# Patient Record
Sex: Female | Born: 1975
Health system: Southern US, Community
[De-identification: ages and names within clinical notes are randomized; demographics above are authoritative.]

## PROBLEM LIST (undated history)

## (undated) DIAGNOSIS — I10 Essential (primary) hypertension: Secondary | ICD-10-CM

## (undated) DIAGNOSIS — D649 Anemia, unspecified: Secondary | ICD-10-CM

## (undated) DIAGNOSIS — Z8679 Personal history of other diseases of the circulatory system: Secondary | ICD-10-CM

## (undated) DIAGNOSIS — R609 Edema, unspecified: Secondary | ICD-10-CM

## (undated) DIAGNOSIS — E119 Type 2 diabetes mellitus without complications: Secondary | ICD-10-CM

## (undated) DIAGNOSIS — K219 Gastro-esophageal reflux disease without esophagitis: Secondary | ICD-10-CM

## (undated) DIAGNOSIS — G43909 Migraine, unspecified, not intractable, without status migrainosus: Secondary | ICD-10-CM

## (undated) DIAGNOSIS — M1990 Unspecified osteoarthritis, unspecified site: Secondary | ICD-10-CM

## (undated) DIAGNOSIS — F909 Attention-deficit hyperactivity disorder, unspecified type: Secondary | ICD-10-CM

## (undated) DIAGNOSIS — F32A Depression, unspecified: Secondary | ICD-10-CM

## (undated) DIAGNOSIS — F419 Anxiety disorder, unspecified: Secondary | ICD-10-CM

## (undated) HISTORY — PX: OTHER SURGICAL HISTORY: SHX169

## (undated) HISTORY — DX: Migraine, unspecified, not intractable, without status migrainosus: G43.909

## (undated) HISTORY — DX: Type 2 diabetes mellitus without complications: E11.9

## (undated) HISTORY — DX: Anemia, unspecified: D64.9

## (undated) HISTORY — PX: OVARIAN CYST REMOVAL: SHX89

## (undated) HISTORY — DX: Depression, unspecified: F32.A

## (undated) HISTORY — DX: Edema, unspecified: R60.9

## (undated) HISTORY — PX: CHOLECYSTECTOMY: SHX55

## (undated) HISTORY — PX: ESOPHAGEAL DILATION: SHX303

## (undated) HISTORY — DX: Personal history of other diseases of the circulatory system: Z86.79

---

## 1998-01-05 ENCOUNTER — Emergency Department (HOSPITAL_COMMUNITY): Admission: EM | Admit: 1998-01-05 | Discharge: 1998-01-05 | Payer: Self-pay | Admitting: Emergency Medicine

## 1998-02-14 ENCOUNTER — Emergency Department (HOSPITAL_COMMUNITY): Admission: EM | Admit: 1998-02-14 | Discharge: 1998-02-14 | Payer: Self-pay | Admitting: Emergency Medicine

## 1998-02-15 ENCOUNTER — Emergency Department (HOSPITAL_COMMUNITY): Admission: EM | Admit: 1998-02-15 | Discharge: 1998-02-15 | Payer: Self-pay | Admitting: Emergency Medicine

## 1999-12-11 ENCOUNTER — Emergency Department (HOSPITAL_COMMUNITY): Admission: EM | Admit: 1999-12-11 | Discharge: 1999-12-11 | Payer: Self-pay | Admitting: Emergency Medicine

## 2002-01-04 ENCOUNTER — Encounter: Payer: Self-pay | Admitting: *Deleted

## 2002-01-04 ENCOUNTER — Emergency Department (HOSPITAL_COMMUNITY): Admission: EM | Admit: 2002-01-04 | Discharge: 2002-01-04 | Payer: Self-pay | Admitting: *Deleted

## 2002-01-22 ENCOUNTER — Encounter (HOSPITAL_COMMUNITY): Admission: RE | Admit: 2002-01-22 | Discharge: 2002-02-21 | Payer: Self-pay | Admitting: Family Medicine

## 2002-02-28 ENCOUNTER — Emergency Department (HOSPITAL_COMMUNITY): Admission: EM | Admit: 2002-02-28 | Discharge: 2002-02-28 | Payer: Self-pay | Admitting: Emergency Medicine

## 2002-02-28 ENCOUNTER — Encounter: Payer: Self-pay | Admitting: Emergency Medicine

## 2004-01-03 ENCOUNTER — Emergency Department (HOSPITAL_COMMUNITY): Admission: EM | Admit: 2004-01-03 | Discharge: 2004-01-03 | Payer: Self-pay | Admitting: Emergency Medicine
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
## 2007-08-16 ENCOUNTER — Emergency Department (HOSPITAL_COMMUNITY): Admission: EM | Admit: 2007-08-16 | Discharge: 2007-08-17 | Payer: Self-pay | Admitting: Emergency Medicine

## 2007-08-16 IMAGING — CT CT ABDOMEN W/ CM
1 of 3 series · 14 of 32 positions shown, 19 images · IV contrast (Omnipaque 300)
Comparison: None

CLINICAL DATA: Rectal bleeding.  Lower abdominal pain.  
 ABDOMEN CT WITH CONTRAST:
TECHNIQUE: Multidetector CT imaging of the abdomen was performed following the standard protocol during bolus administration of intravenous contrast.
 Contrast:  100 cc Omnipaque 300
TECHNIQUE: Multidetector CT imaging of the pelvis was performed following the standard protocol during bolus administration of intravenous contrast.

[Series 2: abd_pel 5.0 b40f · axial · 0.69mm/px · z∈[-466,-52]mm · 14 of 95 slices shown, 19 images]
[im 6/95  soft-tissue]
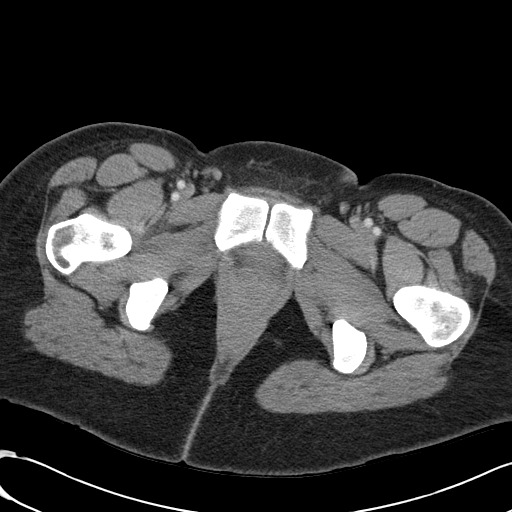
[im 6/95  bone]
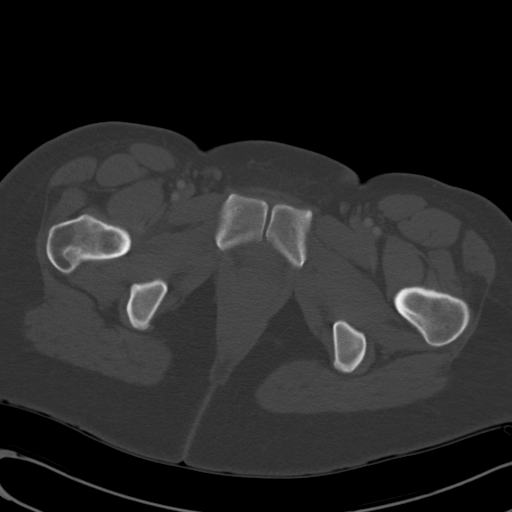
[im 12/95  soft-tissue]
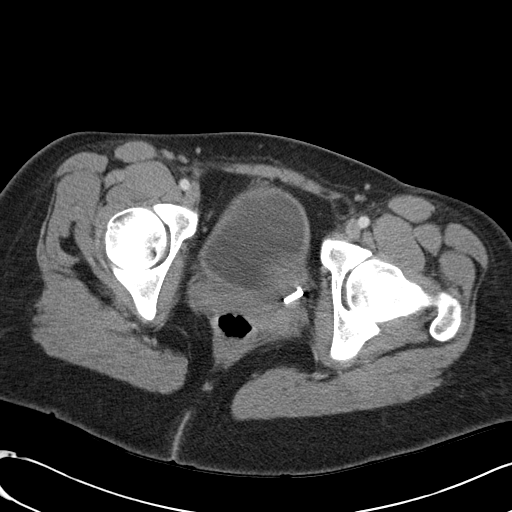
[im 23/95  soft-tissue]
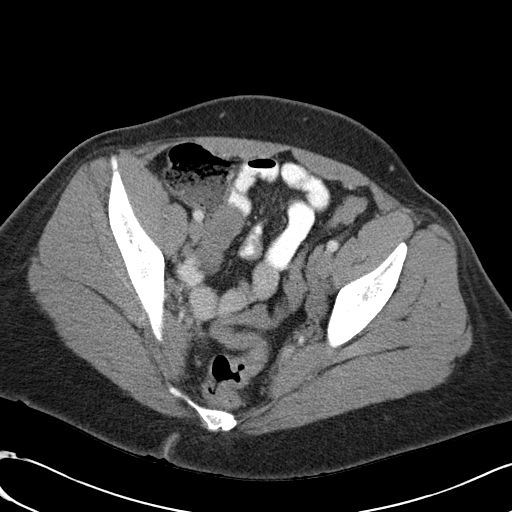
[im 28/95  soft-tissue]
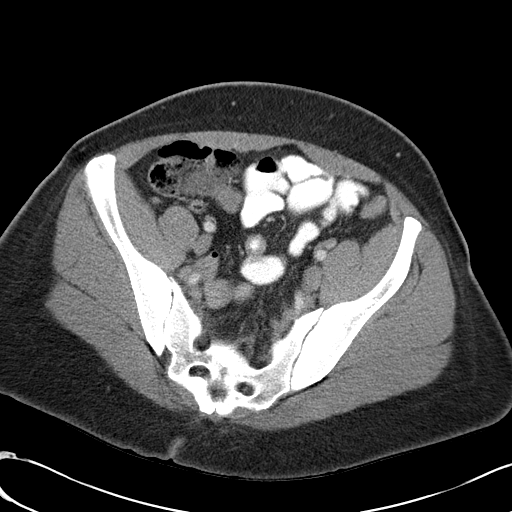
[im 34/95  soft-tissue]
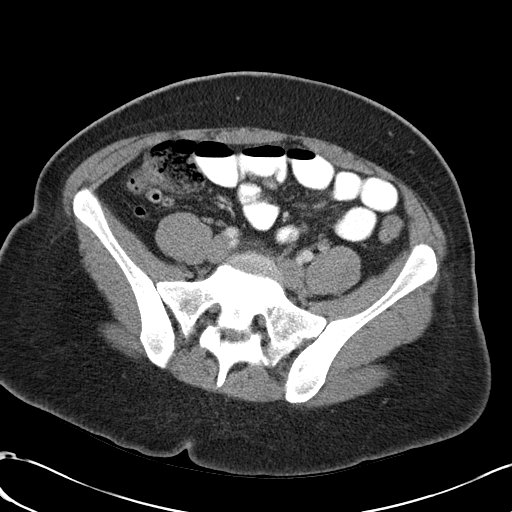
[im 39/95  soft-tissue]
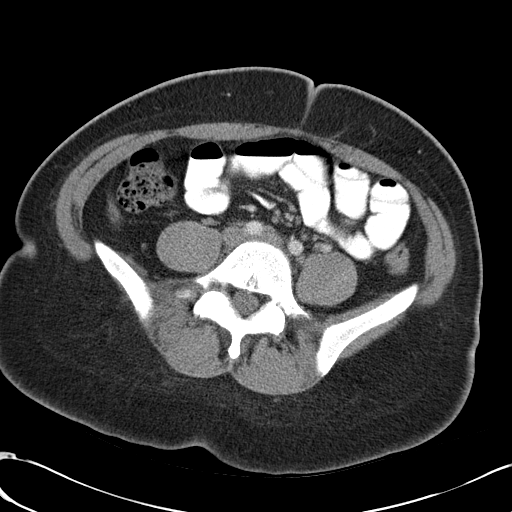
[im 50/95  soft-tissue]
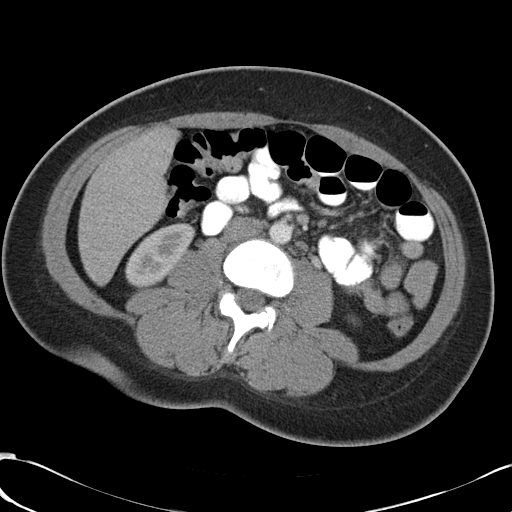
[im 56/95  soft-tissue]
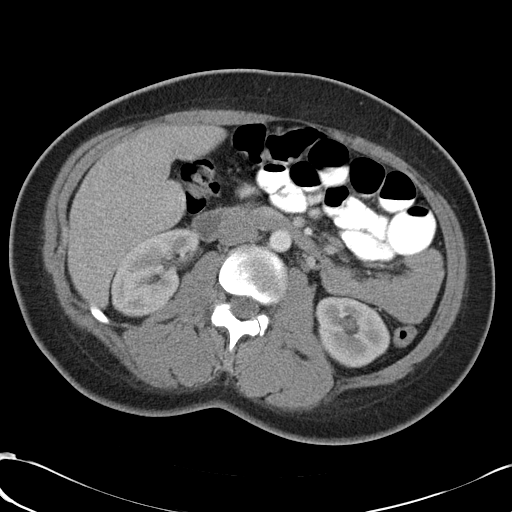
[im 61/95  soft-tissue]
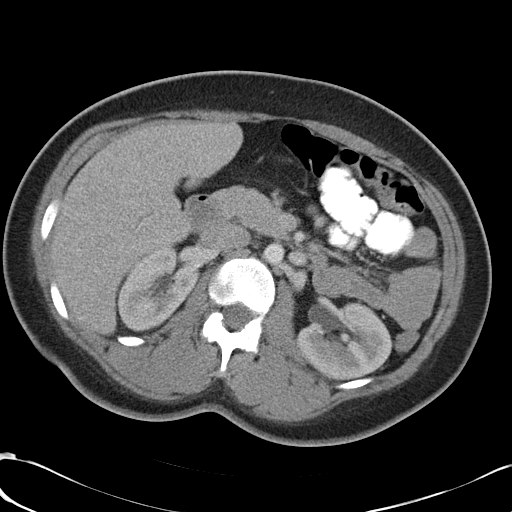
[im 61/95  bone]
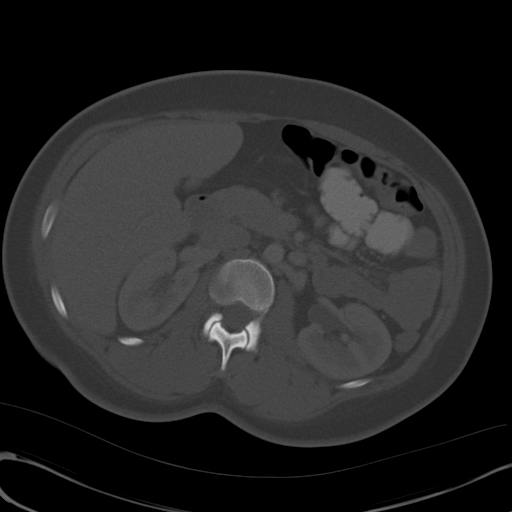
[im 67/95  soft-tissue]
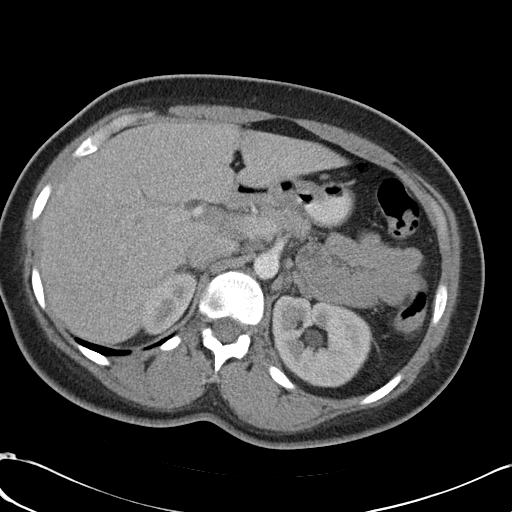
[im 72/95  soft-tissue]
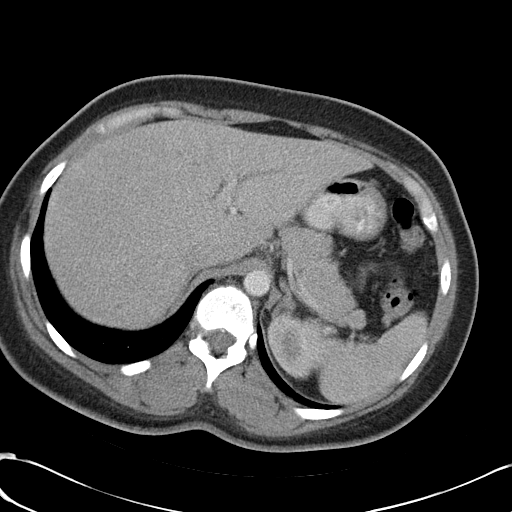
[im 72/95  lung]
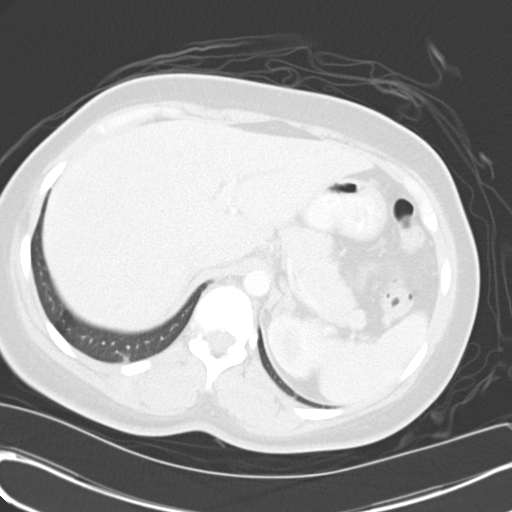
[im 78/95  lung]
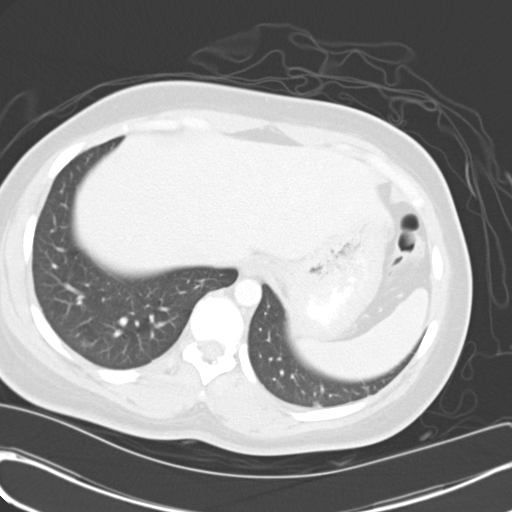
[im 83/95  soft-tissue]
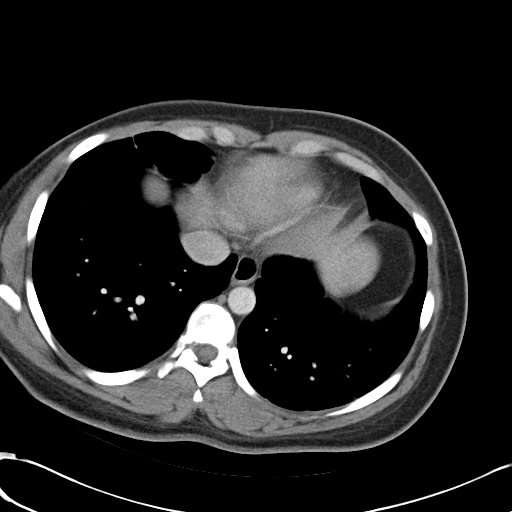
[im 83/95  lung]
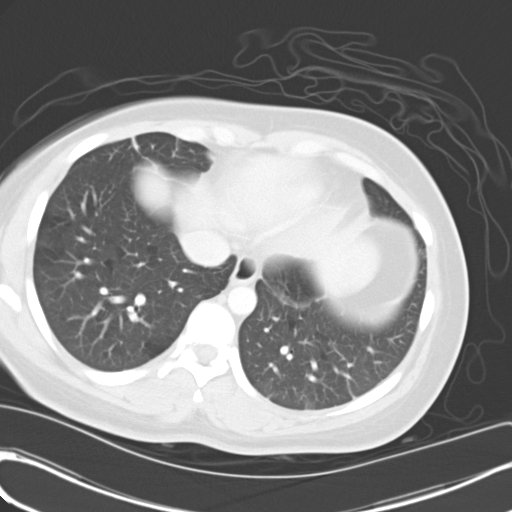
[im 89/95  soft-tissue]
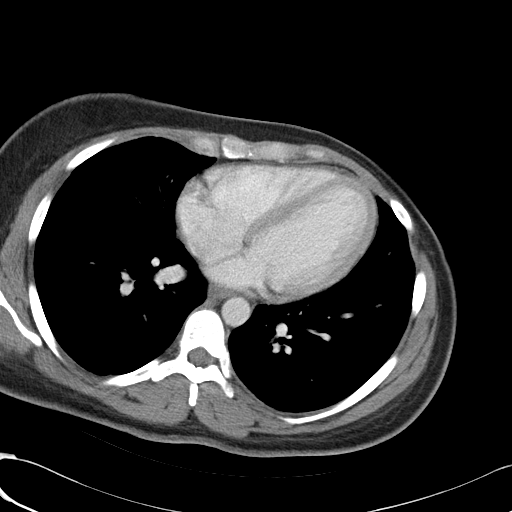
[im 89/95  lung]
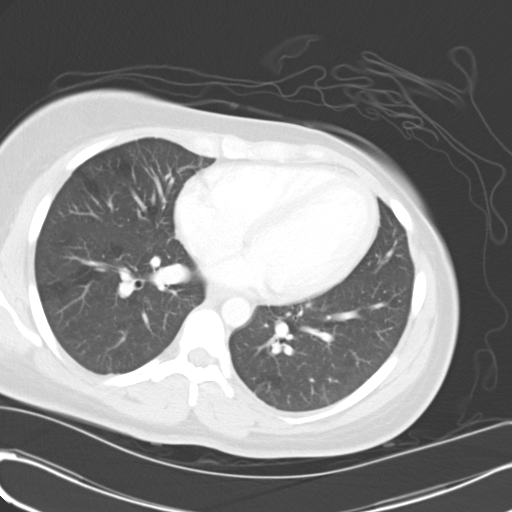

[14 of 32 positions shown; findings below may reference images not displayed]

FINDINGS: There is some basilar atelectatic change.  No pleural or pericardial effusion.  
 Patient is status post cholecystectomy.  The liver, biliary tree, adrenal glands, spleen, pancreas, and kidneys are unremarkable.  Stomach and small bowel have a normal CT appearance.  No abdominal lymphadenopathy or fluid collection.  No focal bony abnormality.
IMPRESSION: No acute finding in the abdomen with postoperative change of cholecystectomy noted. 
 PELVIS CT WITH CONTRAST:
FINDINGS: Surgical clips are noted in the left side of the pelvis.  Uterus and adnexa and unremarkable.  Radiopaque foreign body is noted along the left side of the mesentery anterior to the descending colon which could represent postsurgical post traumatic change.  It is of no clinical significance.  The colon appears normal.  The appendix is well visualized and normal.  No pelvic fluid or lymphadenopathy.  No focal bony abnormality.
IMPRESSION: Postoperative change in the pelvis.  No acute finding.

## 2007-11-10 ENCOUNTER — Emergency Department (HOSPITAL_COMMUNITY): Admission: EM | Admit: 2007-11-10 | Discharge: 2007-11-11 | Payer: Self-pay | Admitting: Emergency Medicine

## 2008-12-09 ENCOUNTER — Emergency Department (HOSPITAL_COMMUNITY): Admission: EM | Admit: 2008-12-09 | Discharge: 2008-12-09 | Payer: Self-pay | Admitting: Emergency Medicine

## 2008-12-22 ENCOUNTER — Emergency Department (HOSPITAL_COMMUNITY): Admission: EM | Admit: 2008-12-22 | Discharge: 2008-12-22 | Payer: Self-pay | Admitting: Emergency Medicine

## 2008-12-22 IMAGING — US US PELVIS COMPLETE MODIFY
1 series · 14 of 25 positions shown · non-contrast
Comparison: None

CLINICAL DATA: Vaginal bleeding, left pelvic pain

TRANSABDOMINAL AND TRANSVAGINAL ULTRASOUND OF PELVIS
TECHNIQUE: Both transabdominal and transvaginal ultrasound
examinations of the pelvis were performed including evaluation of
the uterus, ovaries, adnexal regions, and pelvic cul-de-sac.

[Series 1: us pelvis complete modify · 0.26mm/px · 14 of 58 slices shown]
[im 1/58]
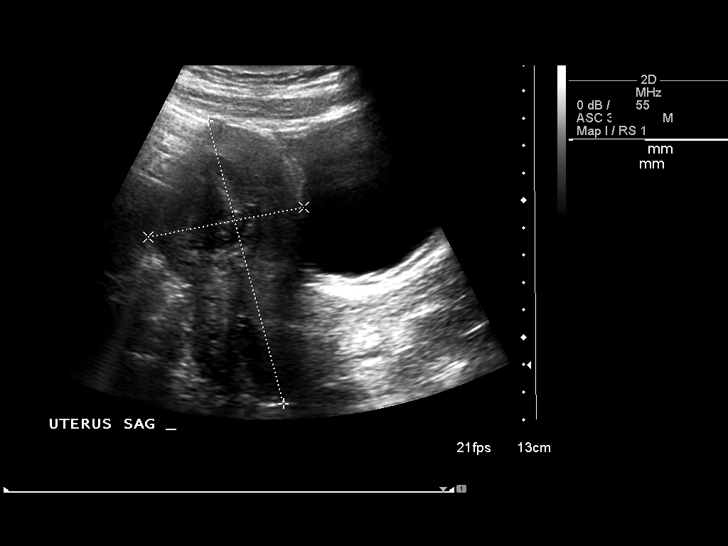
[im 5/58]
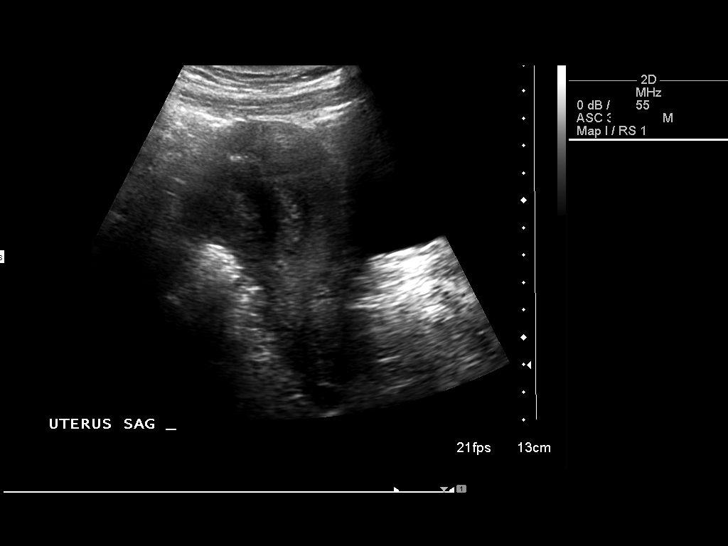
[im 10/58]
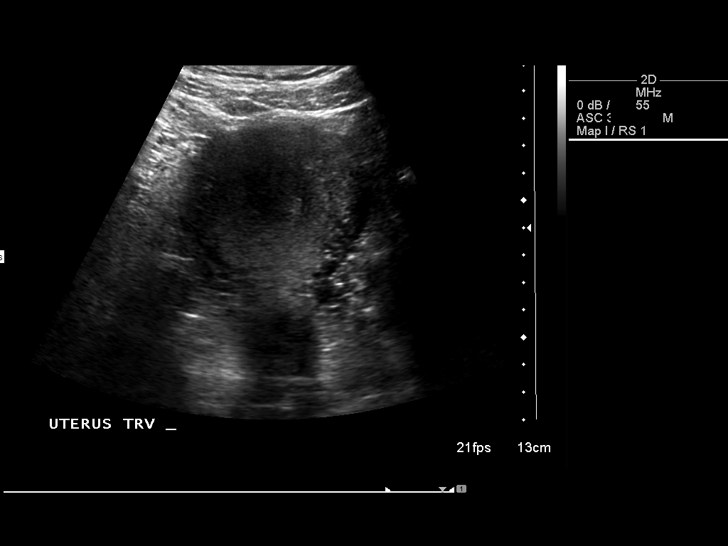
[im 15/58]
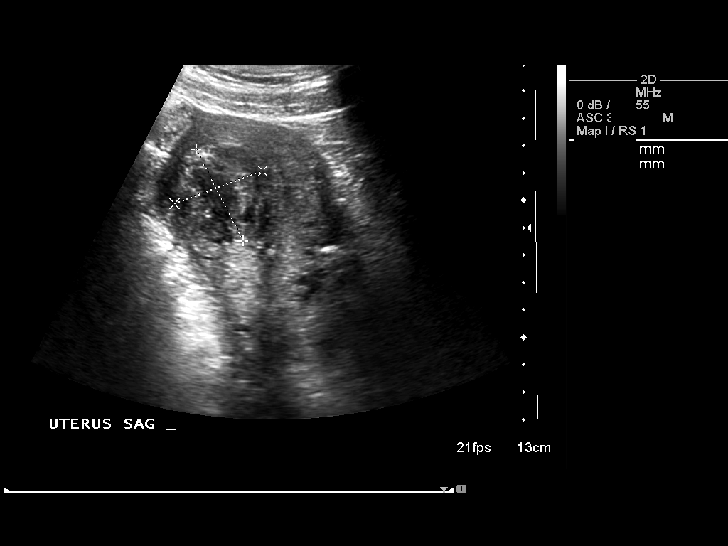
[im 20/58]
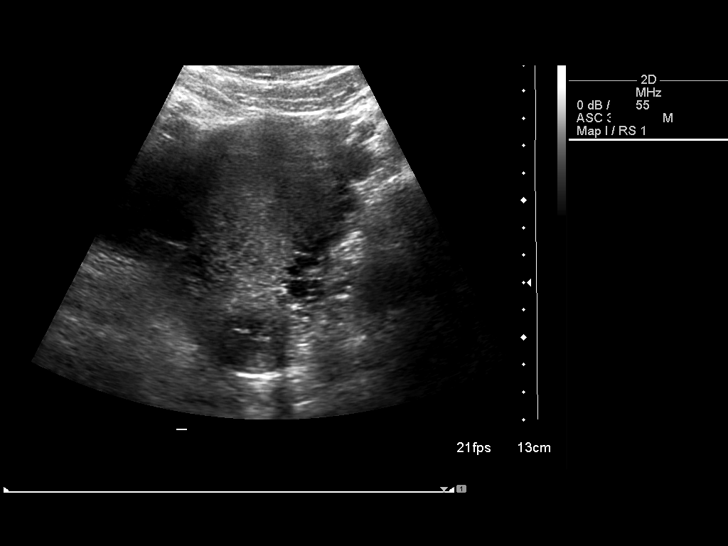
[im 22/58]
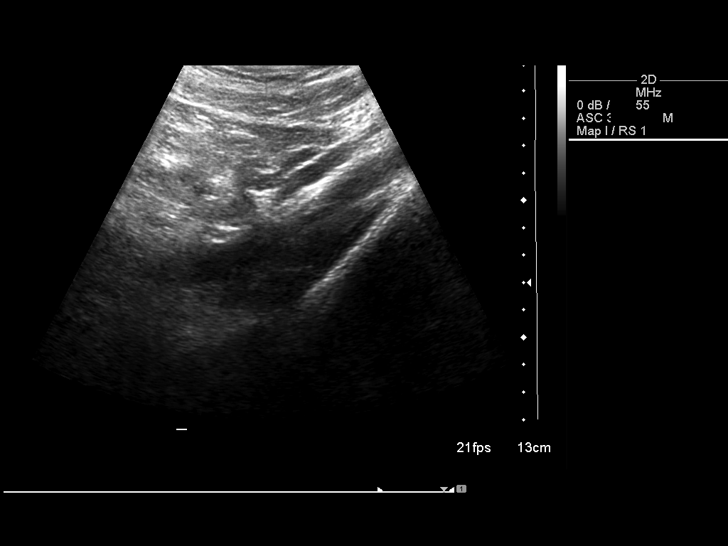
[im 27/58]
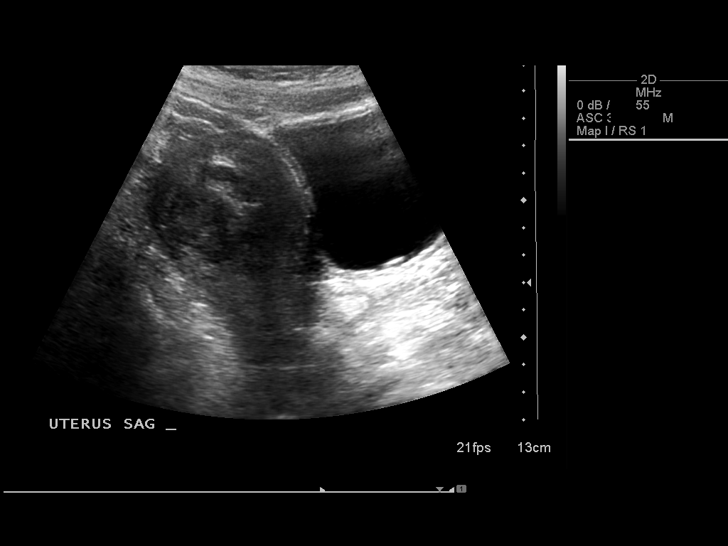
[im 31/58]
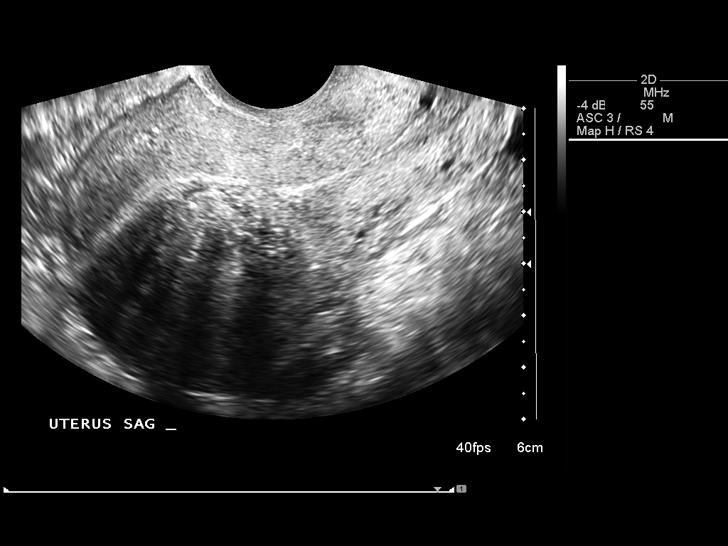
[im 36/58]
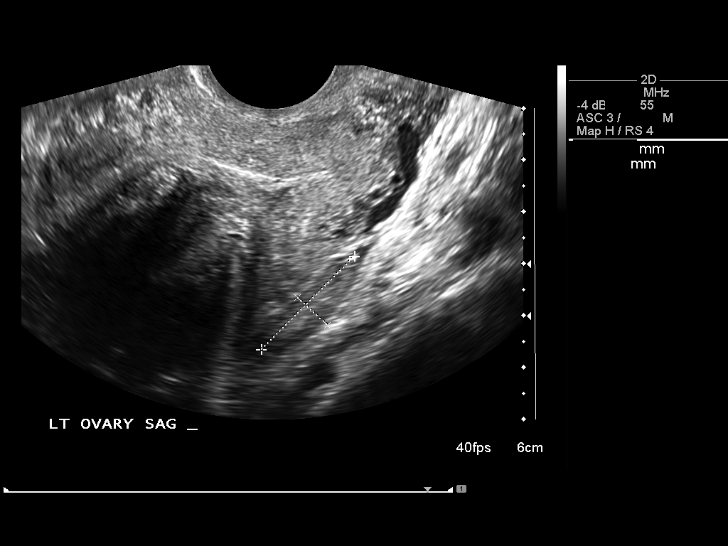
[im 39/58]
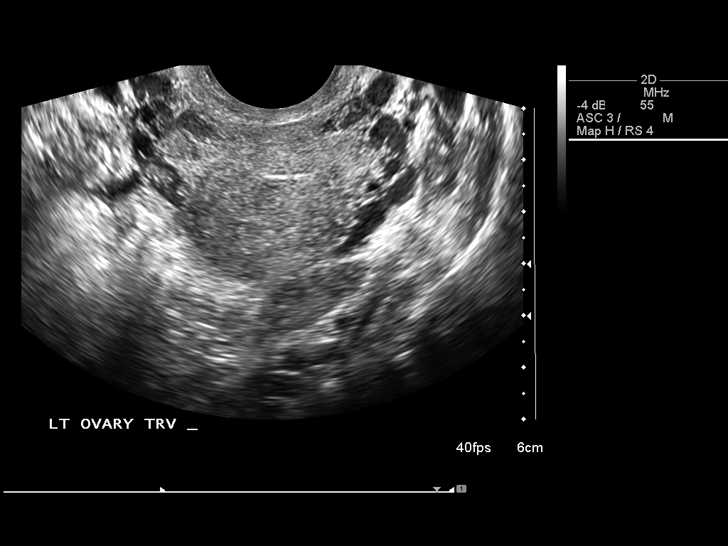
[im 43/58]
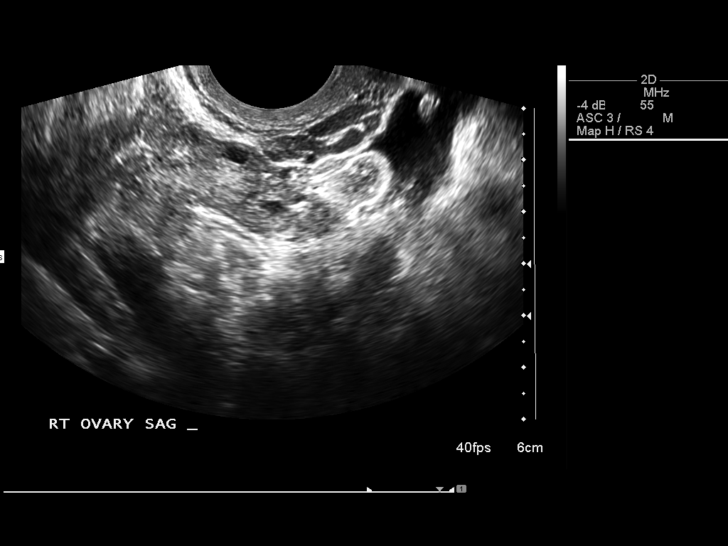
[im 48/58]
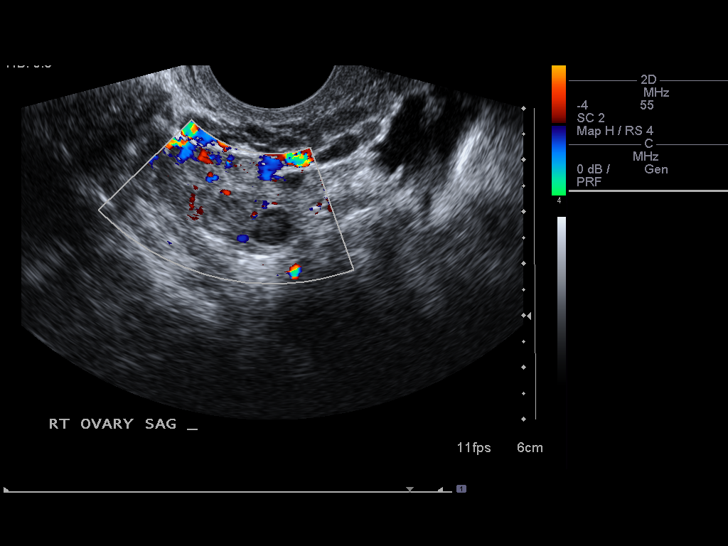
[im 53/58]
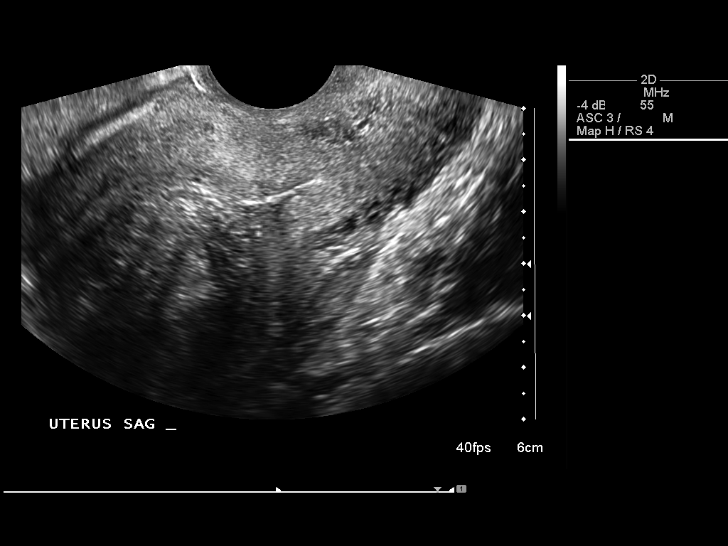
[im 58/58]
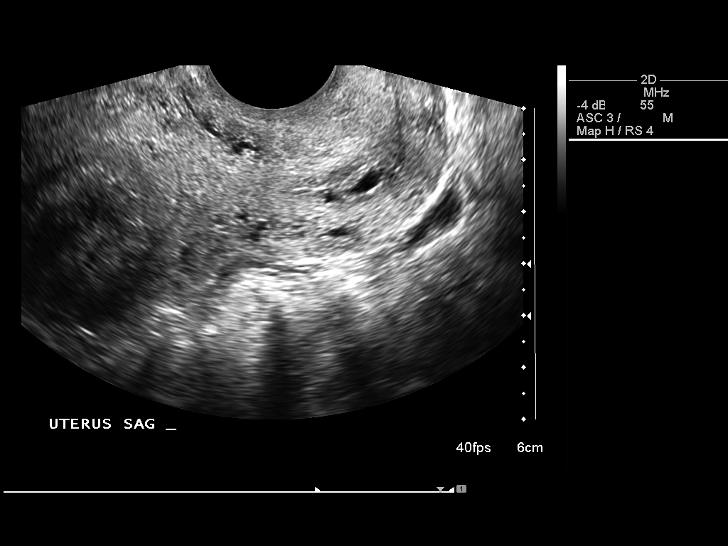

[14 of 25 positions shown; findings below may reference images not displayed]

FINDINGS: Uterus:  10.7 cm length by 5.8 cm AP by 6.7 cm transverse.
Heterogeneous soft tissue mass submucosal at upper uterine segment
posteriorly, 4.1 x 3.7 x 3.7 cm, compatible with leiomyoma.

Endometrium:  Not adequately visualized in upper uterine segment
due to distortion by mass.  Approximately 4 mm thick at mid uterus
adjacent to mass.  No endometrial fluid.

Right Ovary:  3.0 x 1.5 x 2.3 cm.  Hypoechoic nodule 9 mm diameter
likely ruptured follicle cyst.

Left Ovary:  Normal size and morphology, 2.5 x 0.8 x 2.2 cm.

Other Findings:  No adnexal masses or free pelvic fluid.
IMPRESSION: Large submucosal mass identified posteriorly in upper uterine
segment, 4.1 cm greatest size, compatible with large leiomyoma.
No other significant intrapelvic abnormalities.

## 2009-07-09 ENCOUNTER — Emergency Department (HOSPITAL_COMMUNITY): Admission: EM | Admit: 2009-07-09 | Discharge: 2009-07-09 | Payer: Self-pay | Admitting: Emergency Medicine

## 2009-07-09 IMAGING — CR DG LUMBAR SPINE COMPLETE 4+V
5 series · 5 of 5 positions shown · non-contrast
Comparison: None

CLINICAL DATA: Back pain.

LUMBAR SPINE - COMPLETE 4+ VIEW

[view not recorded (1 of 5)]
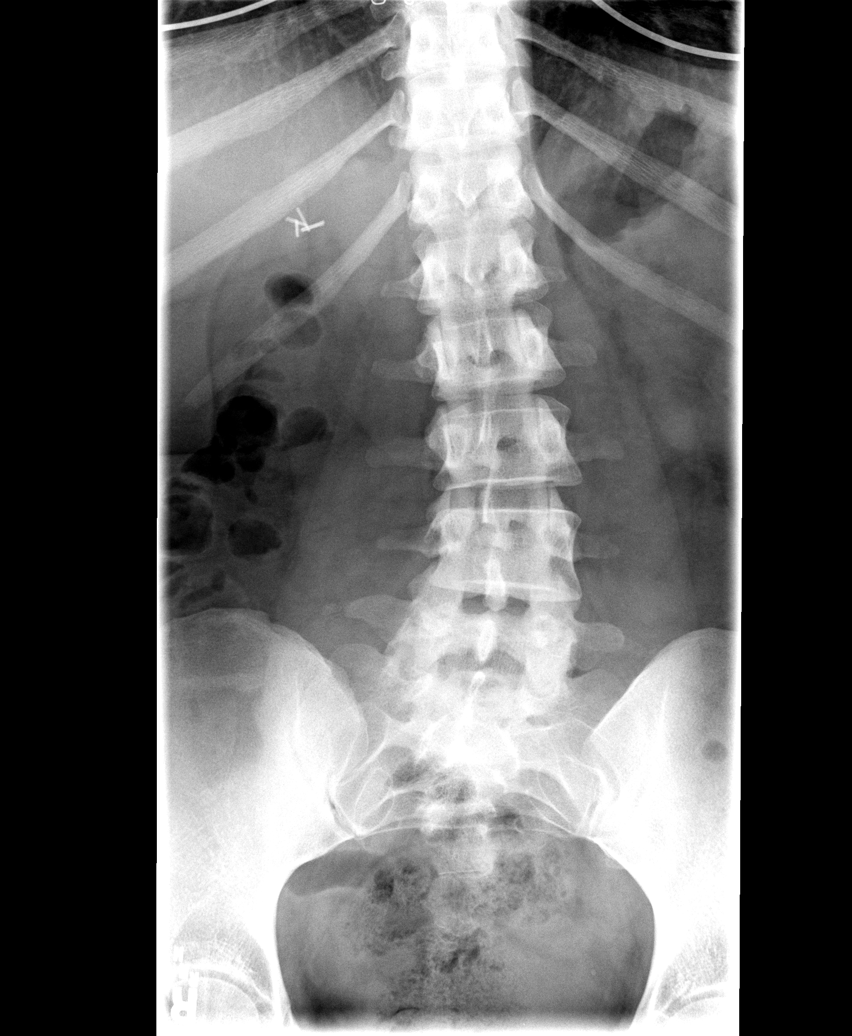

[view not recorded (2 of 5)]
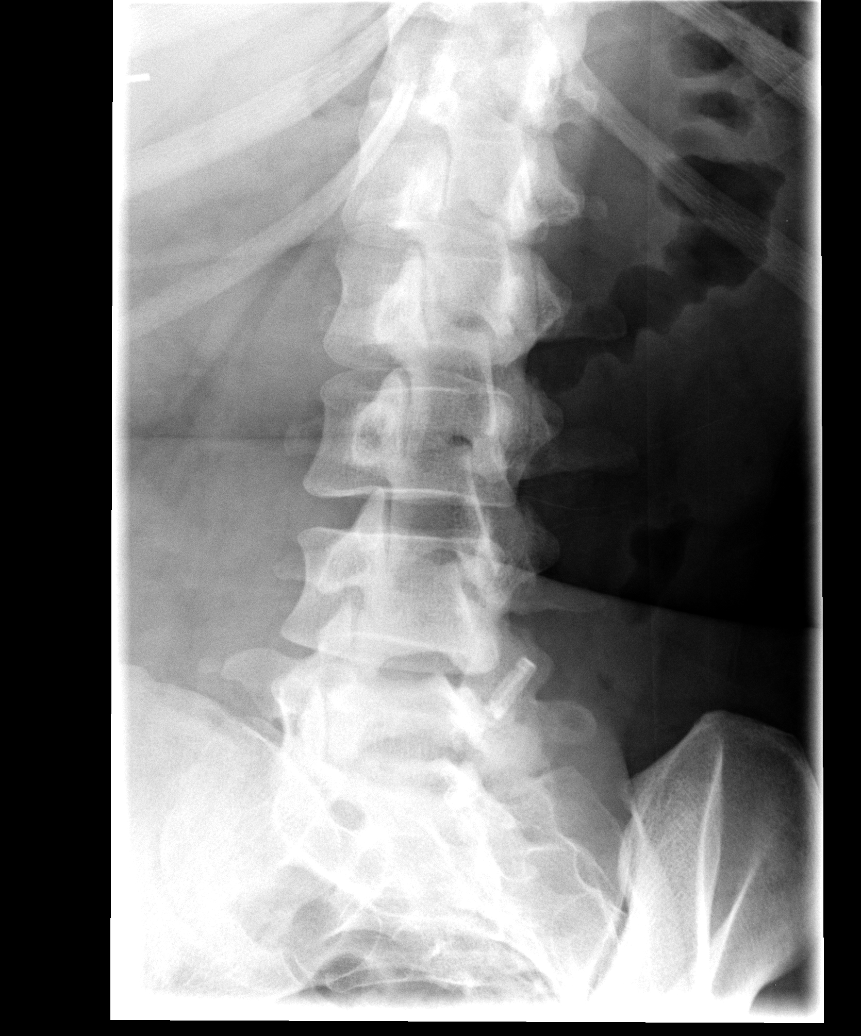

[view not recorded (3 of 5)]
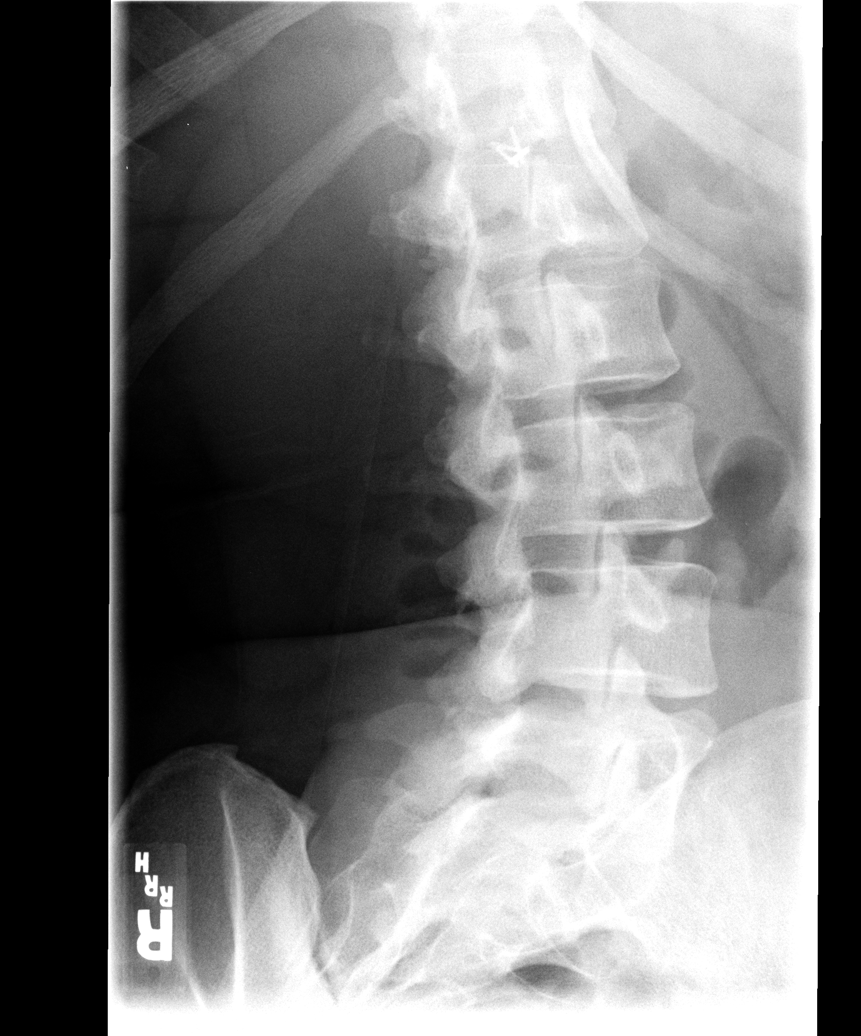

[view not recorded (4 of 5)]
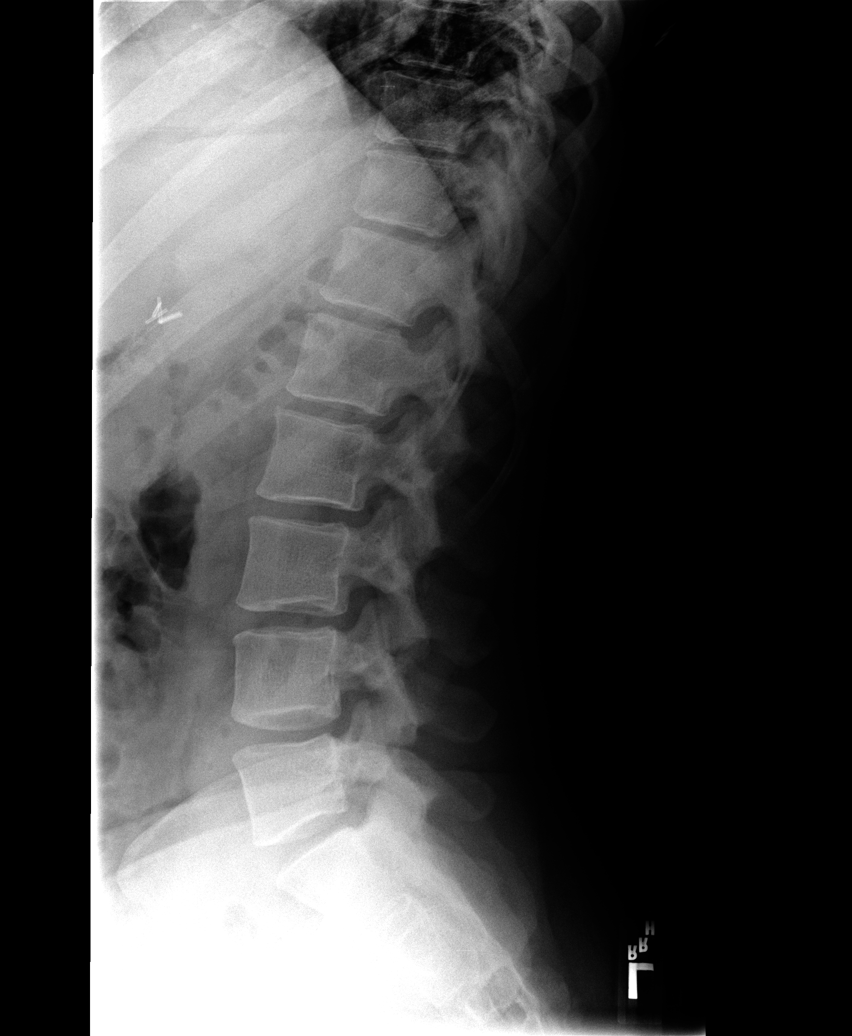

[view not recorded (5 of 5)]
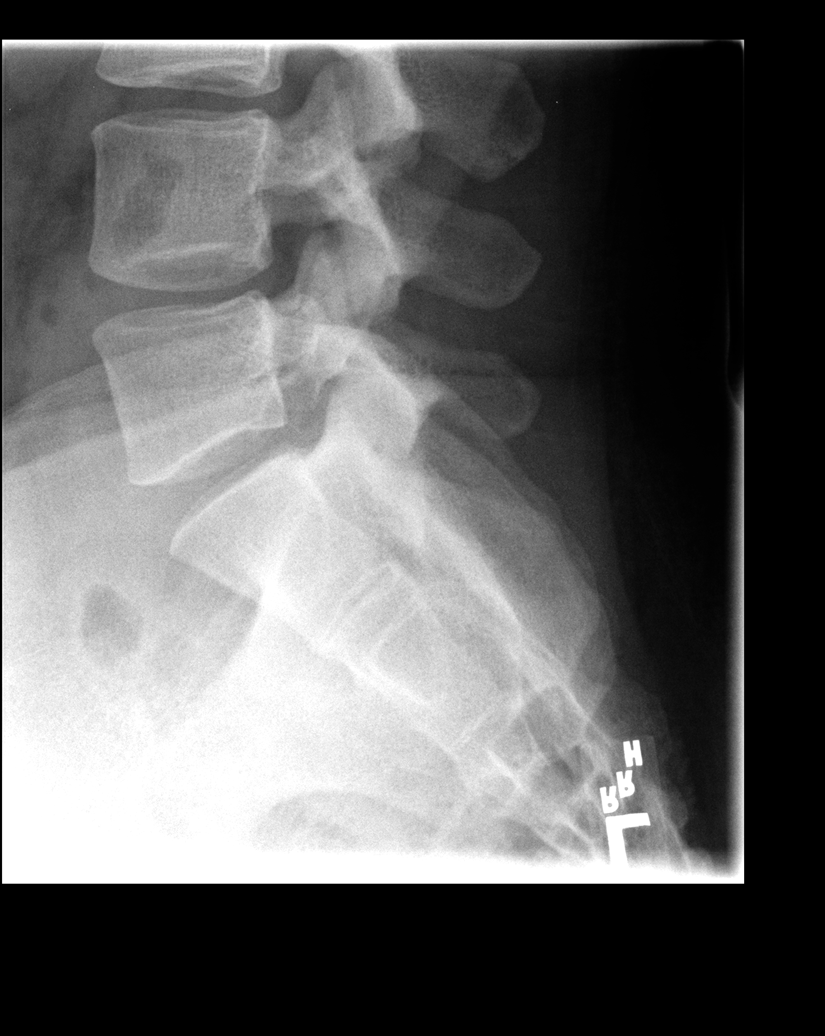

[5 of 5 positions shown; findings below may reference images not displayed]

FINDINGS: There is a moderate left convex lumbar scoliotic
curvature.  The alignment is normal on the lateral film.  Disc
spaces are maintained and the facet joints are normal.  No pars
defects.  The visualized bony pelvis is intact.
IMPRESSION: 1.  Left convex lumbar scoliotic curvature.
2.  No acute bony findings or degenerative changes.

## 2009-11-30 ENCOUNTER — Emergency Department (HOSPITAL_COMMUNITY): Admission: EM | Admit: 2009-11-30 | Discharge: 2009-11-30 | Payer: Self-pay | Admitting: Emergency Medicine

## 2010-05-05 ENCOUNTER — Emergency Department (HOSPITAL_COMMUNITY): Admission: EM | Admit: 2010-05-05 | Discharge: 2010-05-05 | Payer: Self-pay | Admitting: Emergency Medicine

## 2010-05-05 IMAGING — CR DG CHEST 2V
2 series · 2 of 2 positions shown · non-contrast
Comparison: None.

CLINICAL DATA: Chest pain with vomiting.

CHEST - 2 VIEW

[view not recorded (1 of 2)]
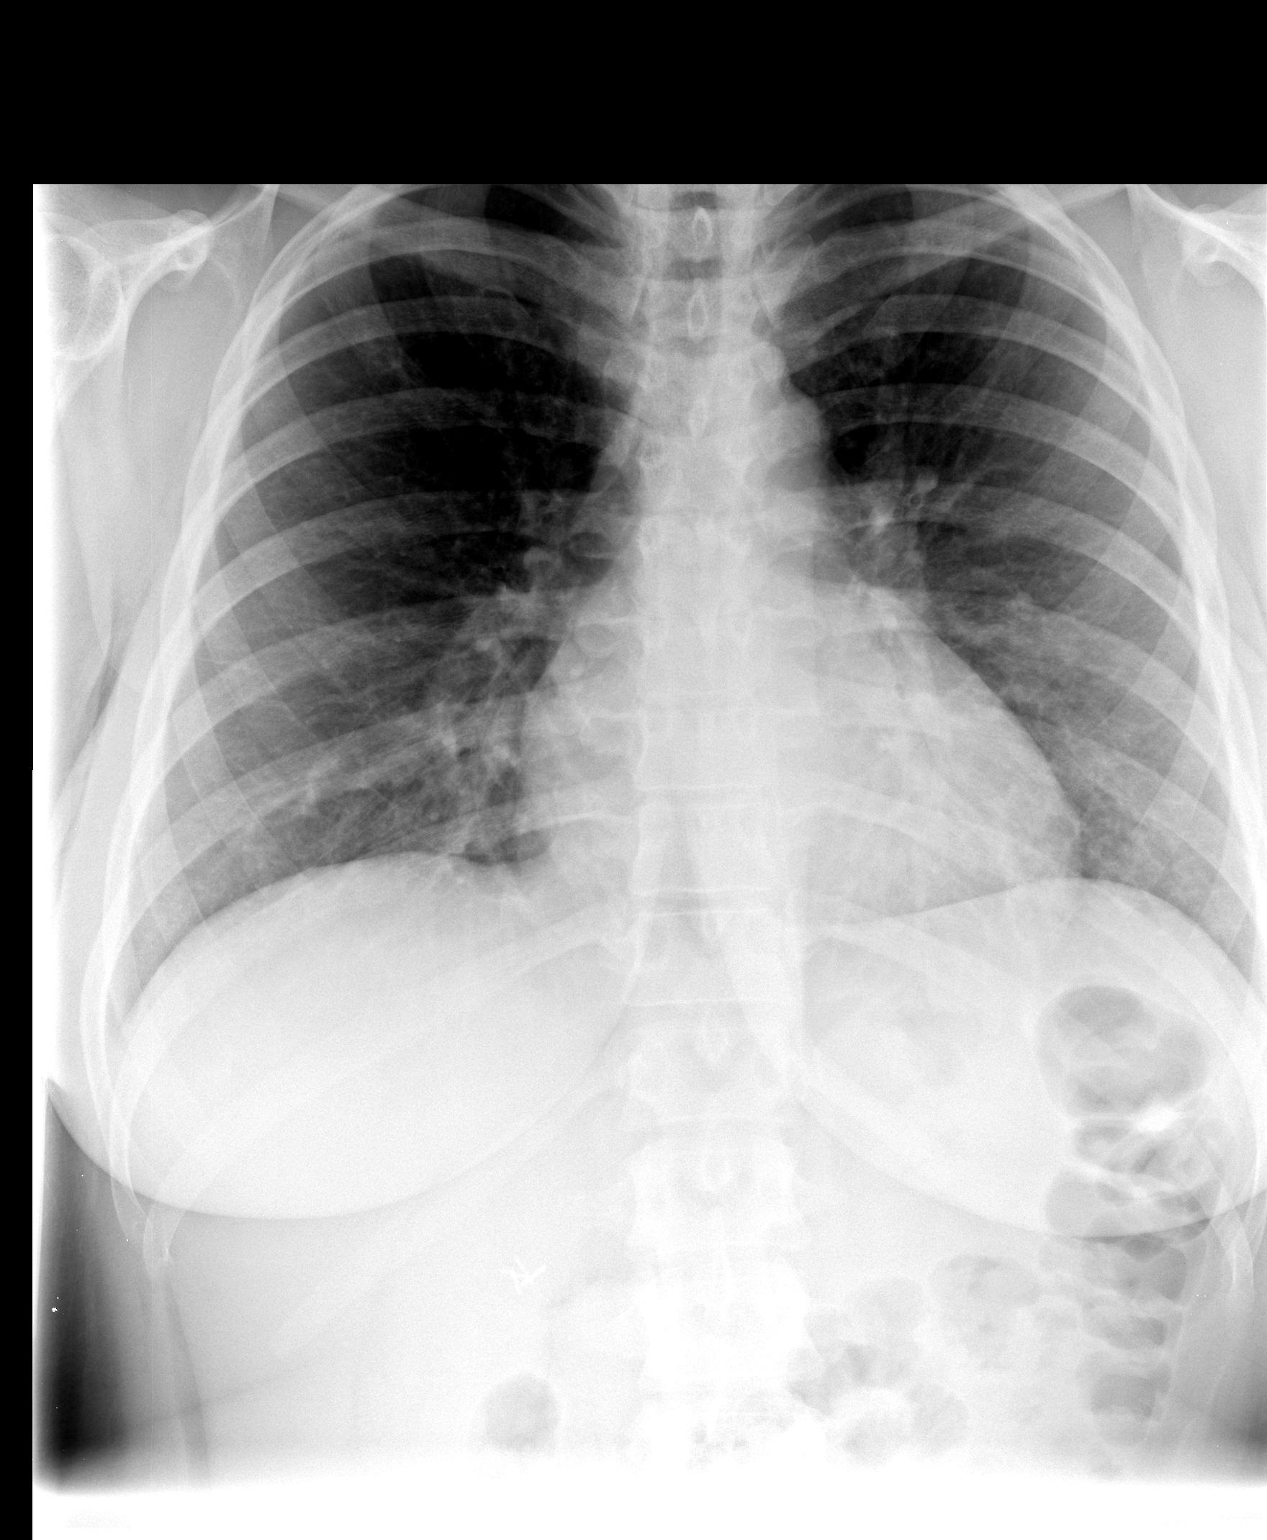

[view not recorded (2 of 2)]
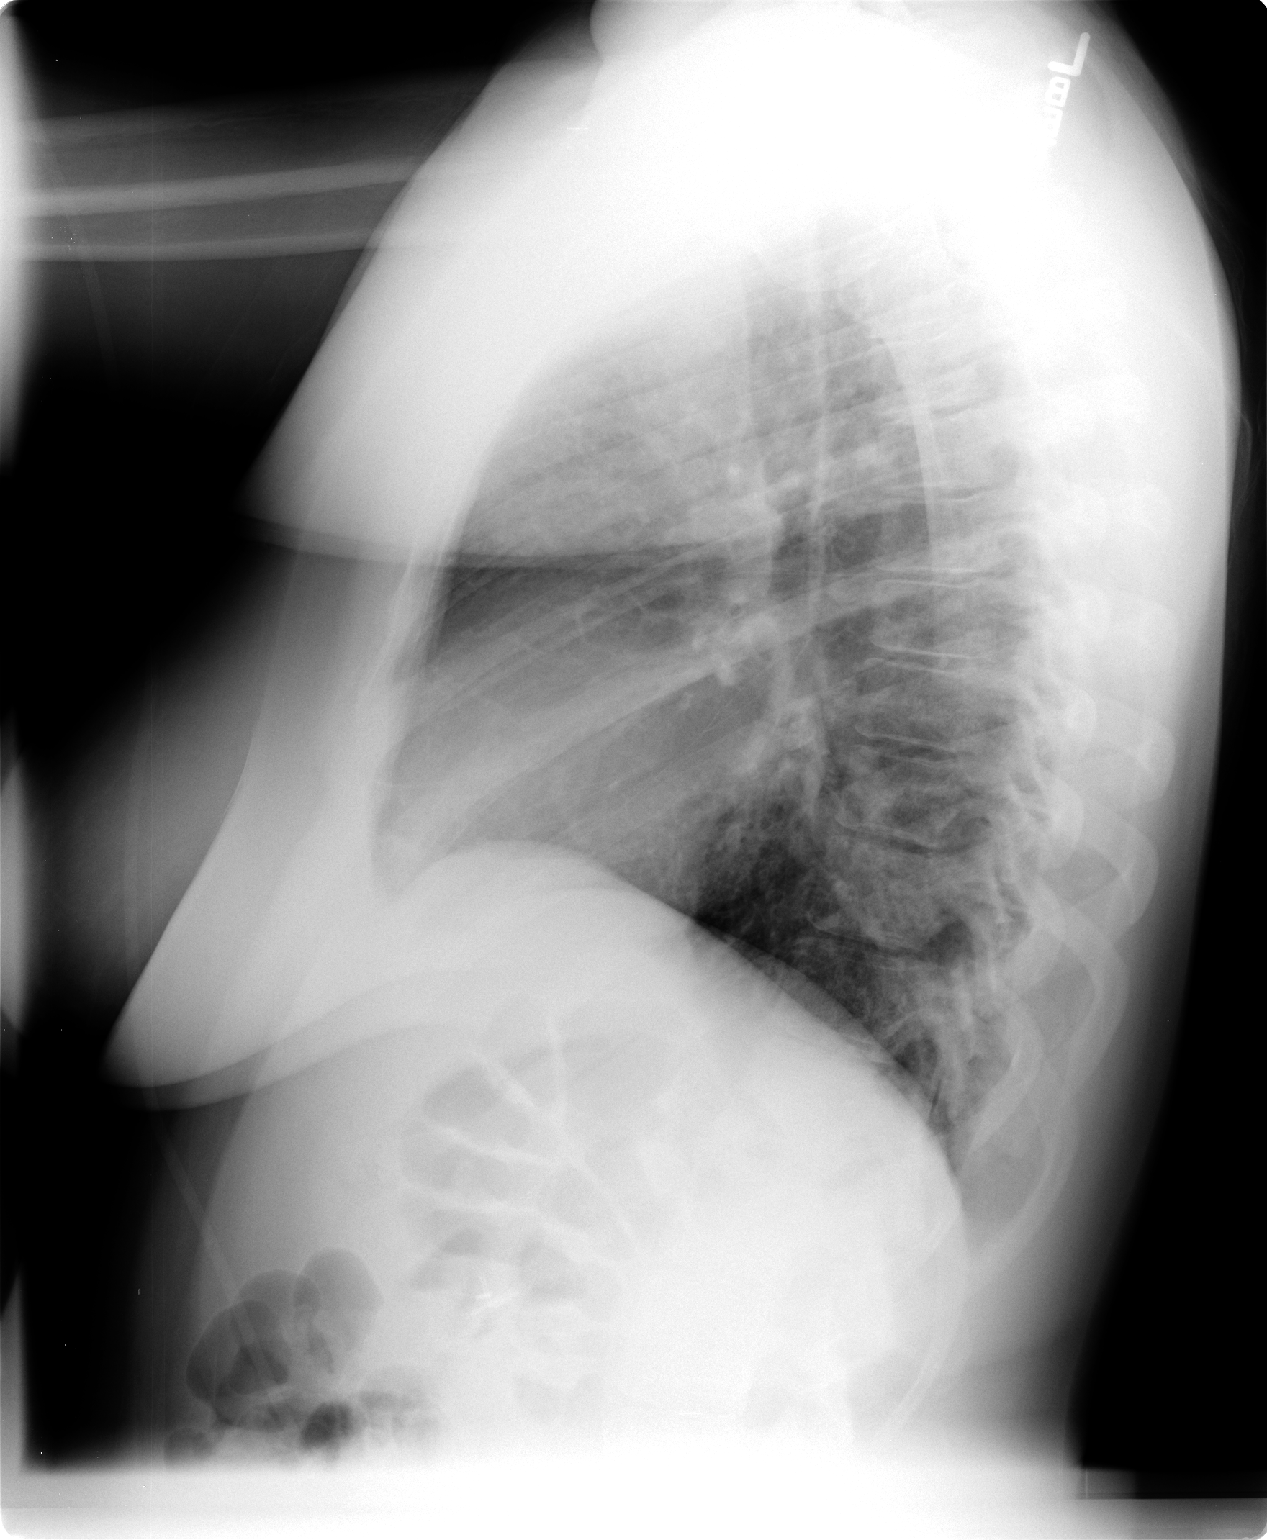

[2 of 2 positions shown; findings below may reference images not displayed]

FINDINGS: The heart size and mediastinal contours are normal.  The
lungs are clear aside from central airway thickening.  There is no
hyperinflation or pleural effusion.  Osseous structures appear
normal.  Cholecystectomy clips are noted.
IMPRESSION: Central airway thickening suggesting bronchitis or viral infection.
No evidence of pneumonia.

## 2010-08-30 ENCOUNTER — Inpatient Hospital Stay (HOSPITAL_COMMUNITY)
Admission: RE | Admit: 2010-08-30 | Discharge: 2010-09-01 | Payer: Self-pay | Source: Home / Self Care | Attending: Obstetrics and Gynecology | Admitting: Obstetrics and Gynecology

## 2010-08-30 ENCOUNTER — Encounter: Payer: Self-pay | Admitting: Obstetrics and Gynecology

## 2010-09-04 ENCOUNTER — Emergency Department (HOSPITAL_COMMUNITY)
Admission: EM | Admit: 2010-09-04 | Discharge: 2010-09-04 | Payer: Self-pay | Source: Home / Self Care | Admitting: Emergency Medicine

## 2010-09-04 HISTORY — PX: ABDOMINAL HYSTERECTOMY: SHX81

## 2010-09-11 ENCOUNTER — Emergency Department (HOSPITAL_COMMUNITY)
Admission: EM | Admit: 2010-09-11 | Discharge: 2010-09-12 | Payer: Self-pay | Source: Home / Self Care | Admitting: Emergency Medicine

## 2010-10-02 ENCOUNTER — Emergency Department (HOSPITAL_COMMUNITY)
Admission: EM | Admit: 2010-10-02 | Discharge: 2010-10-02 | Payer: Self-pay | Source: Home / Self Care | Admitting: Emergency Medicine

## 2010-11-14 LAB — CBC
HCT: 33.8 % — ABNORMAL LOW (ref 36.0–46.0)
HCT: 35.5 % — ABNORMAL LOW (ref 36.0–46.0)
Hemoglobin: 11.4 g/dL — ABNORMAL LOW (ref 12.0–15.0)
Hemoglobin: 12.4 g/dL (ref 12.0–15.0)
MCH: 28.2 pg (ref 26.0–34.0)
MCH: 28.8 pg (ref 26.0–34.0)
MCHC: 33.7 g/dL (ref 30.0–36.0)
MCHC: 34.9 g/dL (ref 30.0–36.0)
MCV: 82.6 fL (ref 78.0–100.0)
MCV: 83.7 fL (ref 78.0–100.0)
Platelets: 279 10*3/uL (ref 150–400)
Platelets: 317 10*3/uL (ref 150–400)
RBC: 4.04 MIL/uL (ref 3.87–5.11)
RBC: 4.3 MIL/uL (ref 3.87–5.11)
RDW: 15.2 % (ref 11.5–15.5)
RDW: 15.3 % (ref 11.5–15.5)
WBC: 13 10*3/uL — ABNORMAL HIGH (ref 4.0–10.5)
WBC: 13.9 10*3/uL — ABNORMAL HIGH (ref 4.0–10.5)

## 2010-11-14 LAB — SURGICAL PCR SCREEN
MRSA, PCR: NEGATIVE
Staphylococcus aureus: NEGATIVE

## 2010-11-14 LAB — TYPE AND SCREEN
ABO/RH(D): A POS
Antibody Screen: NEGATIVE

## 2010-11-14 LAB — COMPREHENSIVE METABOLIC PANEL
ALT: 16 U/L (ref 0–35)
AST: 23 U/L (ref 0–37)
Albumin: 4.3 g/dL (ref 3.5–5.2)
Alkaline Phosphatase: 76 U/L (ref 39–117)
BUN: 5 mg/dL — ABNORMAL LOW (ref 6–23)
CO2: 22 mEq/L (ref 19–32)
Calcium: 9.7 mg/dL (ref 8.4–10.5)
Chloride: 104 mEq/L (ref 96–112)
Creatinine, Ser: 0.58 mg/dL (ref 0.4–1.2)
GFR calc Af Amer: >60 mL/min (ref >60)
GFR calc non Af Amer: >60 mL/min (ref >60)
Glucose, Bld: 93 mg/dL (ref 70–99)
Potassium: 4.1 mEq/L (ref 3.5–5.1)
Sodium: 136 mEq/L (ref 135–145)
Total Bilirubin: 0.2 mg/dL — ABNORMAL LOW (ref 0.3–1.2)
Total Protein: 7 g/dL (ref 6.0–8.3)

## 2010-11-14 LAB — DIFFERENTIAL
Basophils Absolute: 0 10*3/uL (ref 0.0–0.1)
Basophils Relative: 0 % (ref 0–1)
Eosinophils Absolute: 0.2 10*3/uL (ref 0.0–0.7)
Eosinophils Relative: 1 % (ref 0–5)
Lymphocytes Relative: 19 % (ref 12–46)
Lymphs Abs: 2.4 10*3/uL (ref 0.7–4.0)
Monocytes Absolute: 0.9 10*3/uL (ref 0.1–1.0)
Monocytes Relative: 7 % (ref 3–12)
Neutro Abs: 9.4 10*3/uL — ABNORMAL HIGH (ref 1.7–7.7)
Neutrophils Relative %: 73 % (ref 43–77)

## 2010-11-14 LAB — HCG, QUANTITATIVE, PREGNANCY: hCG, Beta Chain, Quant, S: <2 m[IU]/mL (ref <5)

## 2010-11-17 LAB — URINALYSIS, ROUTINE W REFLEX MICROSCOPIC
Bilirubin Urine: NEGATIVE
Glucose, UA: NEGATIVE mg/dL
Hgb urine dipstick: NEGATIVE
Leukocytes, UA: NEGATIVE
Nitrite: POSITIVE — AB
Protein, ur: NEGATIVE mg/dL
Specific Gravity, Urine: >1.03 — ABNORMAL HIGH (ref 1.005–1.030)
Urobilinogen, UA: 0.2 mg/dL (ref 0.0–1.0)
pH: 6 (ref 5.0–8.0)

## 2010-11-17 LAB — URINE MICROSCOPIC-ADD ON

## 2010-11-17 LAB — COMPREHENSIVE METABOLIC PANEL
ALT: 18 U/L (ref 0–35)
AST: 20 U/L (ref 0–37)
Albumin: 4 g/dL (ref 3.5–5.2)
Alkaline Phosphatase: 60 U/L (ref 39–117)
BUN: 4 mg/dL — ABNORMAL LOW (ref 6–23)
CO2: 23 mEq/L (ref 19–32)
Calcium: 9.5 mg/dL (ref 8.4–10.5)
Chloride: 112 mEq/L (ref 96–112)
Creatinine, Ser: 0.65 mg/dL (ref 0.4–1.2)
GFR calc Af Amer: >60 mL/min (ref >60)
GFR calc non Af Amer: >60 mL/min (ref >60)
Glucose, Bld: 129 mg/dL — ABNORMAL HIGH (ref 70–99)
Potassium: 3.4 mEq/L — ABNORMAL LOW (ref 3.5–5.1)
Sodium: 141 mEq/L (ref 135–145)
Total Bilirubin: 0.4 mg/dL (ref 0.3–1.2)
Total Protein: 7.3 g/dL (ref 6.0–8.3)

## 2010-11-17 LAB — DIFFERENTIAL
Basophils Absolute: 0.2 10*3/uL — ABNORMAL HIGH (ref 0.0–0.1)
Basophils Relative: 1 % (ref 0–1)
Eosinophils Absolute: 0.2 10*3/uL (ref 0.0–0.7)
Eosinophils Relative: 1 % (ref 0–5)
Lymphocytes Relative: 30 % (ref 12–46)
Lymphs Abs: 3.5 10*3/uL (ref 0.7–4.0)
Monocytes Absolute: 0.7 10*3/uL (ref 0.1–1.0)
Monocytes Relative: 6 % (ref 3–12)
Neutro Abs: 7.1 10*3/uL (ref 1.7–7.7)
Neutrophils Relative %: 61 % (ref 43–77)

## 2010-11-17 LAB — CBC
HCT: 37 % (ref 36.0–46.0)
Hemoglobin: 12.3 g/dL (ref 12.0–15.0)
MCH: 28 pg (ref 26.0–34.0)
MCHC: 33.2 g/dL (ref 30.0–36.0)
MCV: 84.4 fL (ref 78.0–100.0)
Platelets: 297 10*3/uL (ref 150–400)
RBC: 4.39 MIL/uL (ref 3.87–5.11)
RDW: 17 % — ABNORMAL HIGH (ref 11.5–15.5)
WBC: 11.5 10*3/uL — ABNORMAL HIGH (ref 4.0–10.5)

## 2010-11-17 LAB — POCT PREGNANCY, URINE: Preg Test, Ur: NEGATIVE

## 2010-11-17 LAB — LIPASE, BLOOD: Lipase: 28 U/L (ref 11–59)

## 2010-12-07 LAB — URINALYSIS, ROUTINE W REFLEX MICROSCOPIC
Bilirubin Urine: NEGATIVE
Glucose, UA: NEGATIVE mg/dL
Hgb urine dipstick: NEGATIVE
Ketones, ur: NEGATIVE mg/dL
Nitrite: NEGATIVE
Protein, ur: NEGATIVE mg/dL
Specific Gravity, Urine: 1.015 (ref 1.005–1.030)
Urobilinogen, UA: 0.2 mg/dL (ref 0.0–1.0)
pH: 5.5 (ref 5.0–8.0)

## 2010-12-07 LAB — PREGNANCY, URINE: Preg Test, Ur: NEGATIVE

## 2010-12-14 LAB — GC/CHLAMYDIA PROBE AMP, GENITAL
Chlamydia, DNA Probe: NEGATIVE
Chlamydia, DNA Probe: NEGATIVE
GC Probe Amp, Genital: NEGATIVE
GC Probe Amp, Genital: NEGATIVE

## 2010-12-14 LAB — CBC
HCT: 31.1 % — ABNORMAL LOW (ref 36.0–46.0)
HCT: 33.4 % — ABNORMAL LOW (ref 36.0–46.0)
Hemoglobin: 10.5 g/dL — ABNORMAL LOW (ref 12.0–15.0)
Hemoglobin: 11.3 g/dL — ABNORMAL LOW (ref 12.0–15.0)
MCHC: 33.7 g/dL (ref 30.0–36.0)
MCHC: 33.7 g/dL (ref 30.0–36.0)
MCV: 81 fL (ref 78.0–100.0)
MCV: 80.7 fL (ref 78.0–100.0)
Platelets: 309 10*3/uL (ref 150–400)
Platelets: 286 10*3/uL (ref 150–400)
RBC: 3.85 MIL/uL — ABNORMAL LOW (ref 3.87–5.11)
RBC: 4.14 MIL/uL (ref 3.87–5.11)
RDW: 16.6 % — ABNORMAL HIGH (ref 11.5–15.5)
RDW: 16.6 % — ABNORMAL HIGH (ref 11.5–15.5)
WBC: 9 10*3/uL (ref 4.0–10.5)
WBC: 11.3 10*3/uL — ABNORMAL HIGH (ref 4.0–10.5)

## 2010-12-14 LAB — BASIC METABOLIC PANEL
BUN: 5 mg/dL — ABNORMAL LOW (ref 6–23)
CO2: 21 mEq/L (ref 19–32)
Calcium: 8.7 mg/dL (ref 8.4–10.5)
Chloride: 107 mEq/L (ref 96–112)
Creatinine, Ser: 0.59 mg/dL (ref 0.4–1.2)
GFR calc Af Amer: >60 mL/min (ref >60)
GFR calc non Af Amer: >60 mL/min (ref >60)
Glucose, Bld: 103 mg/dL — ABNORMAL HIGH (ref 70–99)
Potassium: 3.9 mEq/L (ref 3.5–5.1)
Sodium: 138 mEq/L (ref 135–145)

## 2010-12-14 LAB — URINALYSIS, ROUTINE W REFLEX MICROSCOPIC
Bilirubin Urine: NEGATIVE
Bilirubin Urine: NEGATIVE
Glucose, UA: NEGATIVE mg/dL
Glucose, UA: NEGATIVE mg/dL
Hgb urine dipstick: NEGATIVE
Hgb urine dipstick: NEGATIVE
Ketones, ur: NEGATIVE mg/dL
Ketones, ur: NEGATIVE mg/dL
Nitrite: NEGATIVE
Nitrite: NEGATIVE
Protein, ur: NEGATIVE mg/dL
Protein, ur: NEGATIVE mg/dL
Specific Gravity, Urine: 1.02 (ref 1.005–1.030)
Specific Gravity, Urine: 1.03 (ref 1.005–1.030)
Urobilinogen, UA: 0.2 mg/dL (ref 0.0–1.0)
Urobilinogen, UA: 0.2 mg/dL (ref 0.0–1.0)
pH: 5.5 (ref 5.0–8.0)
pH: 5 (ref 5.0–8.0)

## 2010-12-14 LAB — DIFFERENTIAL
Basophils Absolute: 0.1 10*3/uL (ref 0.0–0.1)
Basophils Absolute: 0 10*3/uL (ref 0.0–0.1)
Basophils Relative: 1 % (ref 0–1)
Basophils Relative: 0 % (ref 0–1)
Eosinophils Absolute: 0.1 10*3/uL (ref 0.0–0.7)
Eosinophils Absolute: 0.1 10*3/uL (ref 0.0–0.7)
Eosinophils Relative: 1 % (ref 0–5)
Eosinophils Relative: 1 % (ref 0–5)
Lymphocytes Relative: 24 % (ref 12–46)
Lymphocytes Relative: 24 % (ref 12–46)
Lymphs Abs: 2.2 10*3/uL (ref 0.7–4.0)
Lymphs Abs: 3 10*3/uL (ref 0.7–4.0)
Monocytes Absolute: 0.7 10*3/uL (ref 0.1–1.0)
Monocytes Absolute: 0.4 10*3/uL (ref 0.1–1.0)
Monocytes Relative: 8 % (ref 3–12)
Monocytes Relative: 3 % (ref 3–12)
Neutro Abs: 5.9 10*3/uL (ref 1.7–7.7)
Neutro Abs: 8.9 10*3/uL — ABNORMAL HIGH (ref 1.7–7.7)
Neutrophils Relative %: 66 % (ref 43–77)
Neutrophils Relative %: 72 % (ref 43–77)

## 2010-12-14 LAB — WET PREP, GENITAL
Clue Cells Wet Prep HPF POC: NONE SEEN
Trich, Wet Prep: NONE SEEN
Trich, Wet Prep: NONE SEEN
WBC, Wet Prep HPF POC: NONE SEEN
Yeast Wet Prep HPF POC: NONE SEEN
Yeast Wet Prep HPF POC: NONE SEEN

## 2010-12-14 LAB — PREGNANCY, URINE
Preg Test, Ur: NEGATIVE
Preg Test, Ur: NEGATIVE

## 2010-12-14 LAB — URINE MICROSCOPIC-ADD ON

## 2010-12-14 LAB — RPR: RPR Ser Ql: NONREACTIVE

## 2011-05-04 ENCOUNTER — Emergency Department (HOSPITAL_COMMUNITY): Payer: Self-pay

## 2011-05-04 ENCOUNTER — Emergency Department (HOSPITAL_COMMUNITY)
Admission: EM | Admit: 2011-05-04 | Discharge: 2011-05-05 | Disposition: A | Discharge status: Home / Self Care | Payer: Self-pay | Attending: Emergency Medicine | Admitting: Emergency Medicine

## 2011-05-04 ENCOUNTER — Encounter: Payer: Self-pay | Admitting: *Deleted

## 2011-05-04 DIAGNOSIS — N83209 Unspecified ovarian cyst, unspecified side: Secondary | ICD-10-CM

## 2011-05-04 DIAGNOSIS — R1032 Left lower quadrant pain: Secondary | ICD-10-CM | POA: Insufficient documentation

## 2011-05-04 DIAGNOSIS — Z9079 Acquired absence of other genital organ(s): Secondary | ICD-10-CM | POA: Insufficient documentation

## 2011-05-04 DIAGNOSIS — F17200 Nicotine dependence, unspecified, uncomplicated: Secondary | ICD-10-CM | POA: Insufficient documentation

## 2011-05-04 LAB — URINALYSIS, ROUTINE W REFLEX MICROSCOPIC
Bilirubin Urine: NEGATIVE
Glucose, UA: NEGATIVE mg/dL
Hgb urine dipstick: NEGATIVE
Ketones, ur: NEGATIVE mg/dL
Leukocytes, UA: NEGATIVE
Nitrite: NEGATIVE
Protein, ur: NEGATIVE mg/dL
Specific Gravity, Urine: 1.01 (ref 1.005–1.030)
Urobilinogen, UA: 0.2 mg/dL (ref 0.0–1.0)
pH: 8 (ref 5.0–8.0)

## 2011-05-04 LAB — COMPREHENSIVE METABOLIC PANEL
ALT: 10 U/L (ref 0–35)
AST: 14 U/L (ref 0–37)
Albumin: 4.2 g/dL (ref 3.5–5.2)
Alkaline Phosphatase: 89 U/L (ref 39–117)
BUN: 5 mg/dL — ABNORMAL LOW (ref 6–23)
CO2: 23 mEq/L (ref 19–32)
Calcium: 9.8 mg/dL (ref 8.4–10.5)
Chloride: 104 mEq/L (ref 96–112)
Creatinine, Ser: 0.55 mg/dL (ref 0.50–1.10)
GFR calc Af Amer: >60 mL/min (ref >60)
GFR calc non Af Amer: >60 mL/min (ref >60)
Glucose, Bld: 99 mg/dL (ref 70–99)
Potassium: 3 mEq/L — ABNORMAL LOW (ref 3.5–5.1)
Sodium: 138 mEq/L (ref 135–145)
Total Bilirubin: 0.3 mg/dL (ref 0.3–1.2)
Total Protein: 7.3 g/dL (ref 6.0–8.3)

## 2011-05-04 LAB — CBC
HCT: 36.4 % (ref 36.0–46.0)
Hemoglobin: 12.4 g/dL (ref 12.0–15.0)
MCH: 28.4 pg (ref 26.0–34.0)
MCHC: 34.1 g/dL (ref 30.0–36.0)
MCV: 83.5 fL (ref 78.0–100.0)
Platelets: 297 10*3/uL (ref 150–400)
RBC: 4.36 MIL/uL (ref 3.87–5.11)
RDW: 14.7 % (ref 11.5–15.5)
WBC: 13.2 10*3/uL — ABNORMAL HIGH (ref 4.0–10.5)

## 2011-05-04 IMAGING — CT CT ABD-PELV W/O CM
3 of 4 series · 8 of 46 positions shown, 15 images · non-contrast
Comparison: [DATE]

CLINICAL DATA: Left lower quadrant/left flank pain.

CT ABDOMEN AND PELVIS WITHOUT CONTRAST
TECHNIQUE: Multidetector CT imaging of the abdomen and pelvis was
performed following the standard protocol without intravenous
contrast.

[Series 3: lung 5.0 b60f · axial · 0.78mm/px · z∈[-110,-50]mm · 4 of 20 slices shown, 9 images]
[im 4/20  soft-tissue]
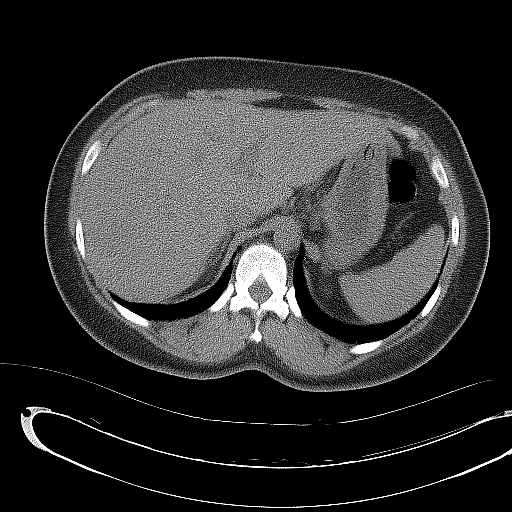
[im 4/20  lung]
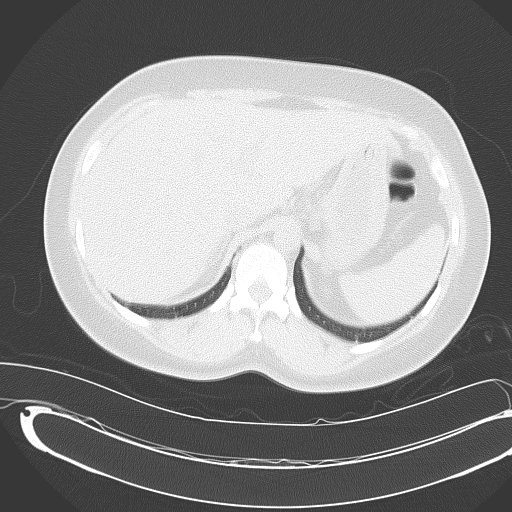
[im 4/20  bone]
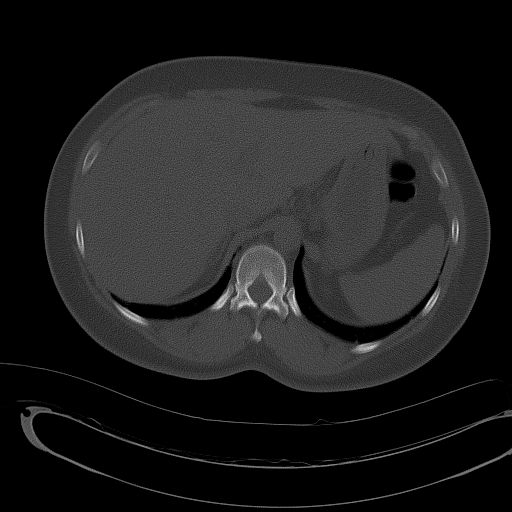
[im 8/20  soft-tissue]
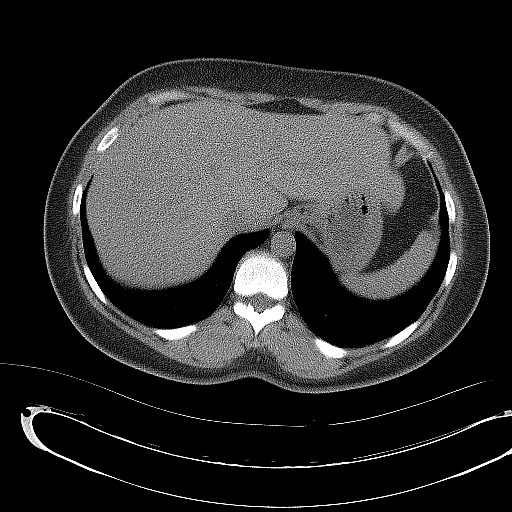
[im 8/20  lung]
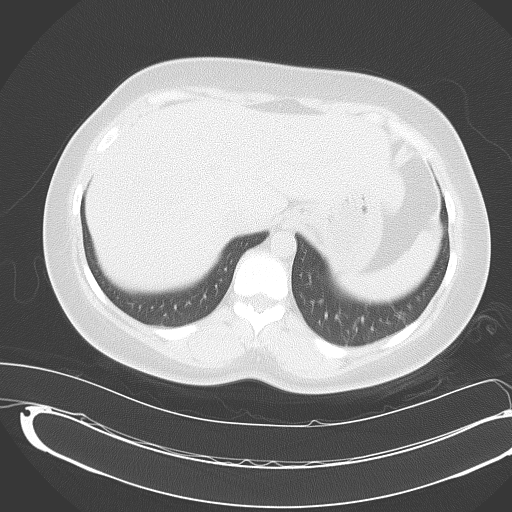
[im 12/20  soft-tissue]
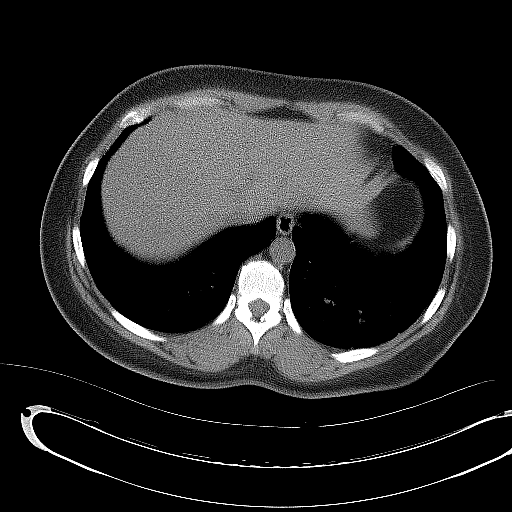
[im 12/20  lung]
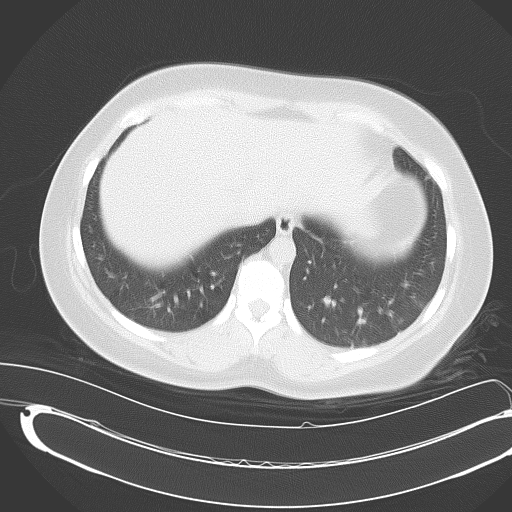
[im 16/20  soft-tissue]
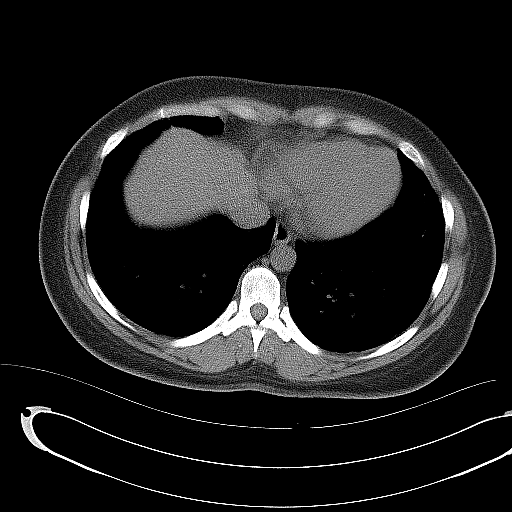
[im 16/20  lung]
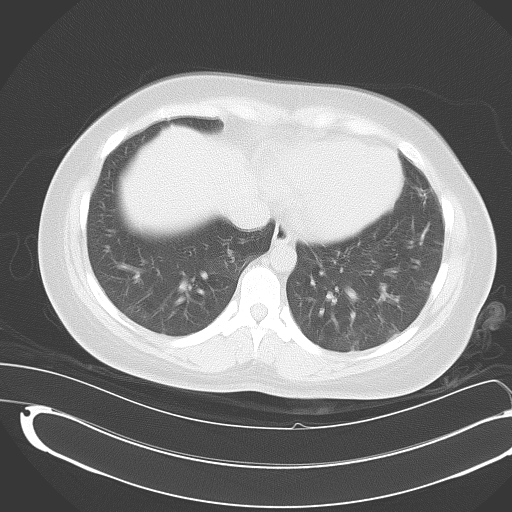

[Series 4: mpr coronal (id) · coronal · 0.71mm/px · 3 of 88 slices shown, 4 images]
[im 30/88  soft-tissue]
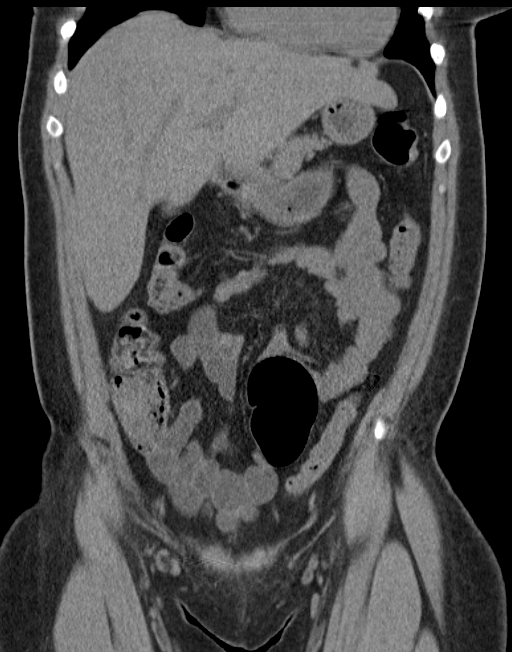
[im 39/88  soft-tissue]
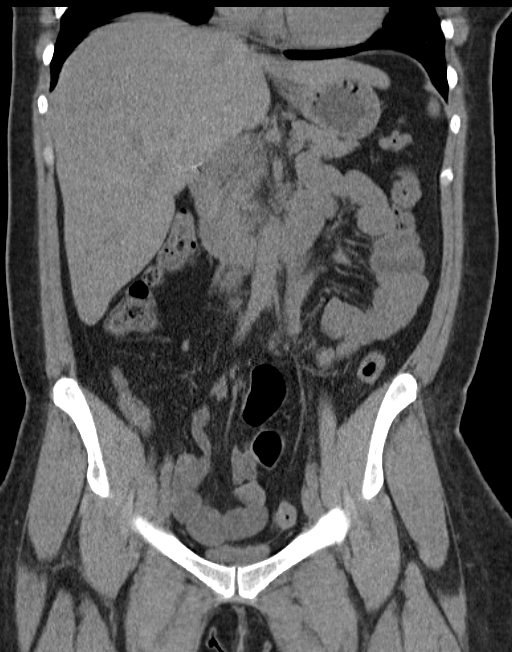
[im 39/88  bone]
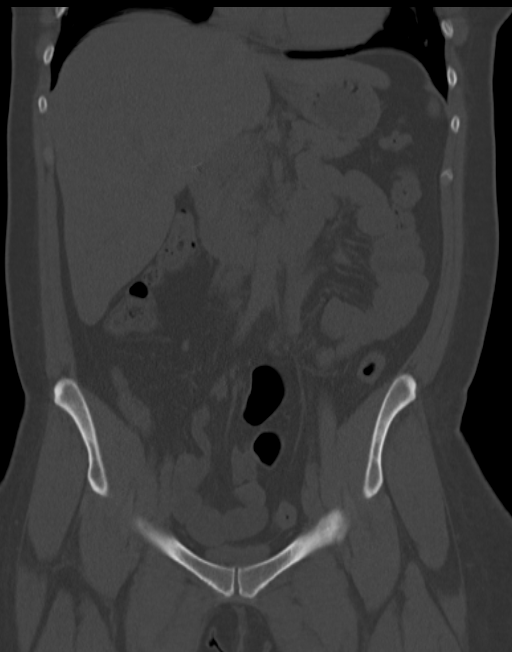
[im 49/88  soft-tissue]
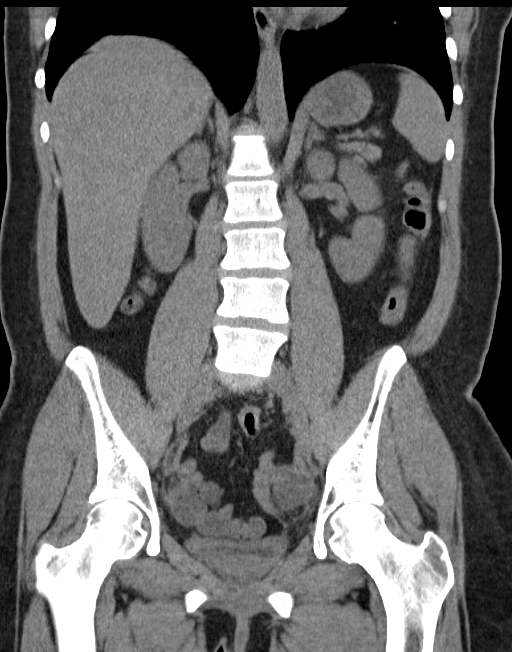

[Series 5: mpr sagittal (id) · sagittal · 0.61mm/px · 1 of 107 slices shown, 2 images]
[im 36/107  soft-tissue]
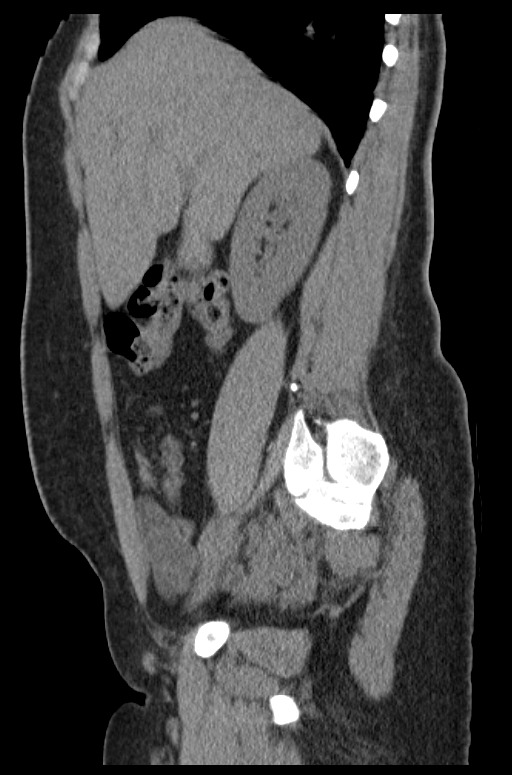
[im 36/107  bone]
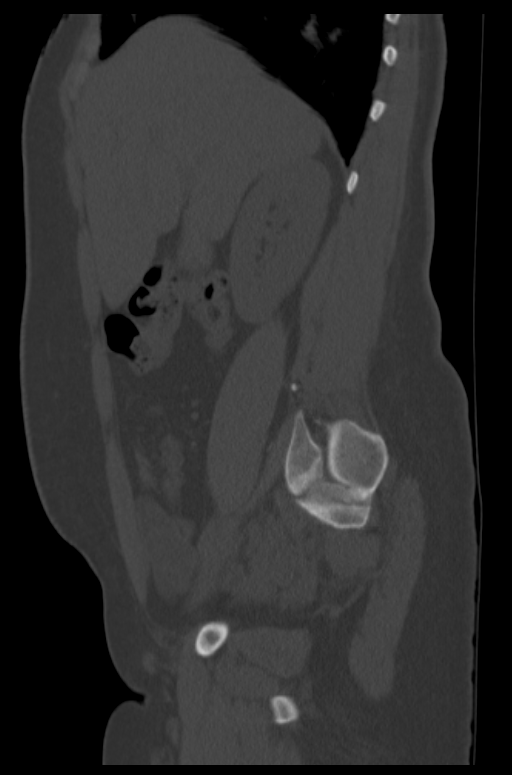

[8 of 46 positions shown; findings below may reference images not displayed]

FINDINGS: Respiratory motion degrades lung base evaluation.  Mild
atelectasis and/or scarring.  Heart size normal.  No pleural or
pericardial effusion.

Abdominal evaluation is limited without intravenous contrast.
Within this limitation, unremarkable liver, spleen, pancreas, and
adrenal glands.  Status post cholecystectomy.  No biliary ductal
dilatation.  The kidneys are symmetric in size.  There is a tiny
interpolar right renal stone.  No hydronephrosis or hydroureter.

No bowel obstruction.  No CT evidence for colitis.  Appendix is
within normal limits.  No free intraperitoneal air or fluid.  Small
fat containing umbilical hernia.

3.9 cm cystic left adnexal lesion with a questionable fluid fluid
level.  No free fluid within the pelvis.  Decompressed bladder.
Uterus absent.  No right adnexal abnormality.

No lymphadenopathy.  Normal caliber vasculature.

No acute osseous abnormality.
IMPRESSION: 3.9 cm left adnexal lesion is favored to represent a mildly complex
(hemorrhagic) ovarian cyst. If remains symptomatic, consider a
follow-up pelvic ultrasound in 6 weeks.

Otherwise, no acute abnormality identified.

## 2011-05-04 MED ORDER — FENTANYL CITRATE 0.05 MG/ML IJ SOLN
100.0000 ug | Freq: Once | INTRAMUSCULAR | Status: AC
  Administered 2011-05-04: 100 ug via INTRAVENOUS
  Filled 2011-05-04: qty 2

## 2011-05-04 MED ORDER — ONDANSETRON HCL 4 MG/2ML IJ SOLN
4.0000 mg | Freq: Once | INTRAMUSCULAR | Status: AC
  Administered 2011-05-04: 4 mg via INTRAVENOUS
  Filled 2011-05-04: qty 2

## 2011-05-04 NOTE — ED Provider Notes (Signed)
History   Scribed for April K Palumbo-Rasch, MD, the patient was seen in room [facility]14/[facility]14. This chart was scribed by Clarita Crane. This patient's care was started at 9:44PM.   CSN: [id number] Arrival date & time: 05/04/2011  9:21 PM  Chief Complaint  Patient presents with  . Abdominal Pain   HPI Virginia Douglas is a 35 y.o. female who presents to the Emergency Department complaining of constant severe LLQ abdominal pain onset 10 hours ago and worsening since. Patient reports pain was initially moderate and gradually worsened throughout the day before become exponentially worse several hours ago. Patient rates abdominal pain a 10/10 currently. Reports BMs have been normal. Denies n/v/d, constipation, dysuria, hematuria, hematochezia, injury/trauma, heavy lifting. Denies h/o previous similar symptoms.  Notes surgical h/o cholecystectomy, partial hysterectomy.  HPI ELEMENTS: Location: LLQ abdomen  Onset: 10 hours ago Duration: worsening since onset  Timing: constant   Severity: severe, 10/10   Context:  as above  Associated symptoms:  Denies n/v/d, constipation, dysuria, hematuria, hematochezia, injury/trauma, heavy lifting.  PAST MEDICAL HISTORY:  History reviewed. No pertinent past medical history.  PAST SURGICAL HISTORY:  Past Surgical History  Procedure Date  . Abdominal hysterectomy     MEDICATIONS:  Previous Medications   B COMPLEX-BIOTIN-FA (B-COMPLEX PO)    Take 1 tablet by mouth daily.     FERROUS SULFATE 325 (65 FE) MG TABLET    Take 325 mg by mouth daily with breakfast.     IBUPROFEN (ADVIL,MOTRIN) 200 MG TABLET    Take 800 mg by mouth as needed. For pain      ALLERGIES:  Allergies as of 05/04/2011  . (No Known Allergies)     FAMILY HISTORY:  Family History  Problem Relation Age of Onset  . Cancer Mother   . Diabetes Mother   . Hypertension Mother   . Cancer Father   . Diabetes Father   . Hypertension Father      SOCIAL HISTORY: History   Social  History  . Marital Status: Divorced    Spouse Name: N/A    Number of Children: N/A  . Years of Education: N/A   Social History Main Topics  . Smoking status: Current Everyday Smoker -- 1.0 packs/day  . Smokeless tobacco: None  . Alcohol Use: No  . Drug Use: No  . Sexually Active: Yes    Birth Control/ Protection: Surgical   Other Topics Concern  . None   Social History Narrative  . None     Review of Systems 10 Systems reviewed and are negative for acute change except as noted in the HPI.  Physical Exam  BP 121/62  Pulse 84  Temp(Src) 97.6 F (36.4 C) (Oral)  Resp 20  Ht 5\' 6"  (1.676 m)  Wt 192 lb (87.091 kg)  BMI 30.99 kg/m2  SpO2 98%  Physical Exam  Nursing note and vitals reviewed. Constitutional: She is oriented to person, place, and time. She appears well-developed and well-nourished.       Uncomfortable appearing.   HENT:  Head: Normocephalic and atraumatic.       Moist mucous membranes.   Eyes: EOM are normal. Pupils are equal, round, and reactive to light.  Neck: Neck supple. No tracheal deviation present. No thyromegaly present.  Cardiovascular: Normal rate and regular rhythm.  Exam reveals no gallop and no friction rub.   No murmur heard. Pulmonary/Chest: Effort normal and breath sounds normal. She has no wheezes.  Abdominal: Soft. Bowel sounds are normal. She  exhibits no distension. There is tenderness in the left lower quadrant. There is no rebound and no guarding.       Left flank tenderness  Musculoskeletal: Normal range of motion. She exhibits no edema.  Neurological: She is alert and oriented to person, place, and time. No sensory deficit.  Skin: Skin is warm and dry.  Psychiatric: She has a normal mood and affect. Her behavior is normal.    ED Course  Procedures  OTHER DATA REVIEWED: Nursing notes, vital signs, and past medical records reviewed. Lab results reviewed and considered Imaging results reviewed and considered  DIAGNOSTIC  STUDIES: Oxygen Saturation is 98% on room air, normal by my interpretation.    LABS / RADIOLOGY: Results for orders placed during the hospital encounter of 05/04/11  CBC      Component Value Range   WBC 13.2 (*) 4.0 - 10.5 (K/uL)   RBC 4.36  3.87 - 5.11 (MIL/uL)   Hemoglobin 12.4  12.0 - 15.0 (g/dL)   HCT 16.1  09.6 - 04.5 (%)   MCV 83.5  78.0 - 100.0 (fL)   MCH 28.4  26.0 - 34.0 (pg)   MCHC 34.1  30.0 - 36.0 (g/dL)   RDW 40.9  81.1 - 91.4 (%)   Platelets 297  150 - 400 (K/uL)  COMPREHENSIVE METABOLIC PANEL      Component Value Range   Sodium 138  135 - 145 (mEq/L)   Potassium 3.0 (*) 3.5 - 5.1 (mEq/L)   Chloride 104  96 - 112 (mEq/L)   CO2 23  19 - 32 (mEq/L)   Glucose, Bld 99  70 - 99 (mg/dL)   BUN 5 (*) 6 - 23 (mg/dL)   Creatinine, Ser 7.82  0.50 - 1.10 (mg/dL)   Calcium 9.8  8.4 - 95.6 (mg/dL)   Total Protein 7.3  6.0 - 8.3 (g/dL)   Albumin 4.2  3.5 - 5.2 (g/dL)   AST 14  0 - 37 (U/L)   ALT 10  0 - 35 (U/L)   Alkaline Phosphatase 89  39 - 117 (U/L)   Total Bilirubin 0.3  0.3 - 1.2 (mg/dL)   GFR calc non Af Amer >60  >60 (mL/min)   GFR calc Af Amer >60  >60 (mL/min)  URINALYSIS, ROUTINE W REFLEX MICROSCOPIC      Component Value Range   Color, Urine STRAW (*) YELLOW    Appearance CLEAR  CLEAR    Specific Gravity, Urine 1.010  1.005 - 1.030    pH 8.0  5.0 - 8.0    Glucose, UA NEGATIVE  NEGATIVE (mg/dL)   Hgb urine dipstick NEGATIVE  NEGATIVE    Bilirubin Urine NEGATIVE  NEGATIVE    Ketones, ur NEGATIVE  NEGATIVE (mg/dL)   Protein, ur NEGATIVE  NEGATIVE (mg/dL)   Urobilinogen, UA 0.2  0.0 - 1.0 (mg/dL)   Nitrite NEGATIVE  NEGATIVE    Leukocytes, UA NEGATIVE  NEGATIVE     Ct Abdomen Pelvis Wo Contrast  05/04/2011  *RADIOLOGY REPORT*  Clinical Data: Left lower quadrant/left flank pain.  CT ABDOMEN AND PELVIS WITHOUT CONTRAST  Technique:  Multidetector CT imaging of the abdomen and pelvis was performed following the standard protocol without intravenous contrast.   Comparison: 08/16/2007  Findings: Respiratory motion degrades lung base evaluation.  Mild atelectasis and/or scarring.  Heart size normal.  No pleural or pericardial effusion.  Abdominal evaluation is limited without intravenous contrast. Within this limitation, unremarkable liver, spleen, pancreas, and adrenal glands.  Status post cholecystectomy.  No biliary ductal dilatation.  The kidneys are symmetric in size.  There is a tiny interpolar right renal stone.  No hydronephrosis or hydroureter.  No bowel obstruction.  No CT evidence for colitis.  Appendix is within normal limits.  No free intraperitoneal air or fluid.  Small fat containing umbilical hernia.  3.9 cm cystic left adnexal lesion with a questionable fluid fluid level.  No free fluid within the pelvis.  Decompressed bladder. Uterus absent.  No right adnexal abnormality.  No lymphadenopathy.  Normal caliber vasculature.  No acute osseous abnormality.  IMPRESSION: 3.9 cm left adnexal lesion is favored to represent a mildly complex (hemorrhagic) ovarian cyst. If remains symptomatic, consider a follow-up pelvic ultrasound in 6 weeks.  Otherwise, no acute abnormality identified.  Original Report Authenticated By: Waneta Martins, M.D.    PROCEDURES:  ED COURSE / COORDINATION OF CARE: Orders Placed This Encounter  Procedures  . CT Abdomen Pelvis Wo Contrast  . CBC  . Comprehensive metabolic panel  . Urinalysis, Routine w reflex microscopic       PLAN: pain management, and labs The patient is to return the emergency department if there is any worsening of symptoms. I have reviewed the discharge instructions with the patient/family    MEDICATIONS GIVEN IN THE E.D.  Medications  ibuprofen (ADVIL,MOTRIN) 200 MG tablet (not administered)  ferrous sulfate 325 (65 FE) MG tablet (not administered)  B Complex-Biotin-FA (B-COMPLEX PO) (not administered)  fentaNYL (SUBLIMAZE) injection 100 mcg (100 mcg Intravenous Given 05/04/11 2213)    fentaNYL (SUBLIMAZE) injection 100 mcg (100 mcg Intravenous Given 05/04/11 2307)  ondansetron (ZOFRAN) injection 4 mg (4 mg Intravenous Given 05/04/11 2306)      I personally performed the services described in this documentation, which was scribed in my presence. The recorded information has been reviewed and considered. April Smitty Cords, MD    April Smitty Cords, MD 05/05/11 [phone]

## 2011-05-04 NOTE — ED Notes (Signed)
Abdominal pain radiating into back ?

## 2011-05-04 NOTE — ED Notes (Signed)
Into room to see patient. Resting in bed on left side.states pain is 7/10. Denies nausea. Would like something else for pain. Would like something to drink. Notified awaiting ct results. Verbalized understanding. Husband at bedside. Call bell at bedside. MD aware.

## 2011-05-04 NOTE — ED Notes (Signed)
Patient medicated as ordered per md. Resting in bed on back. Denies any needs at this time. Pain 8/10. Call bell at bedside. Husband at bedside. Bed in low position and locked with side rails up. Pending ct.

## 2011-05-04 NOTE — ED Notes (Signed)
MD at bedside. 

## 2011-05-04 NOTE — ED Notes (Signed)
One unsuccessful attempt. Second nurse into room to attempt iv access.

## 2011-05-04 NOTE — ED Notes (Signed)
Patient up to bathroom to obtain urine specimen via clean catch. Pain 9/10. Denies nausea. Ambulated well to bathroom. md made aware.

## 2011-05-04 NOTE — ED Notes (Signed)
Patient to radiology via stretcher

## 2011-05-04 NOTE — ED Notes (Signed)
Patient back to room from radiology. 

## 2011-05-04 NOTE — ED Notes (Signed)
Into room to attempt iv access. 

## 2011-05-04 NOTE — ED Notes (Signed)
Patient presents to ED with right lower abdominal pain that radiates to right lower back. Started around 1200 this afternoon and progressively got worse around 1800 this evening. Nothing makes pain better or worse. Last BM was yesterday and normal for patient. Last food\ fluid at 1700 today; tolerated well. Denies nausea, vomiting, diarrhea.

## 2011-05-05 ENCOUNTER — Emergency Department (HOSPITAL_COMMUNITY): Payer: Self-pay

## 2011-05-05 IMAGING — US US PELVIS COMPLETE
1 series · 13 of 25 positions shown · non-contrast
Comparison: CT [DATE]

CLINICAL DATA: Left-sided pelvic pain

TRANSABDOMINAL AND TRANSVAGINAL ULTRASOUND OF PELVIS
DOPPLER ULTRASOUND OF OVARIES
TECHNIQUE: Both transabdominal and transvaginal ultrasound
examinations of the pelvis were performed. Transabdominal technique
was performed for global imaging of the pelvis including uterus,
ovaries, adnexal regions, and pelvic cul-de-sac.
It was necessary to proceed with endovaginal exam following the
transabdominal exam to visualize the ovaries, neither seen
transabdominally.
Color and duplex Doppler ultrasound was utilized to evaluate blood
flow to the ovaries.

[Series 1: us pelvis complete · 0.26mm/px · 50 acquisitions, 13 frames shown]
[im 1/50]
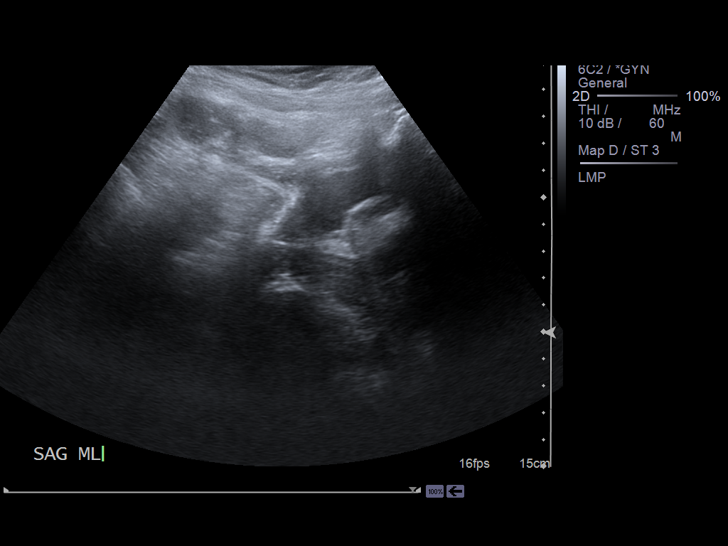
[im 5/50]
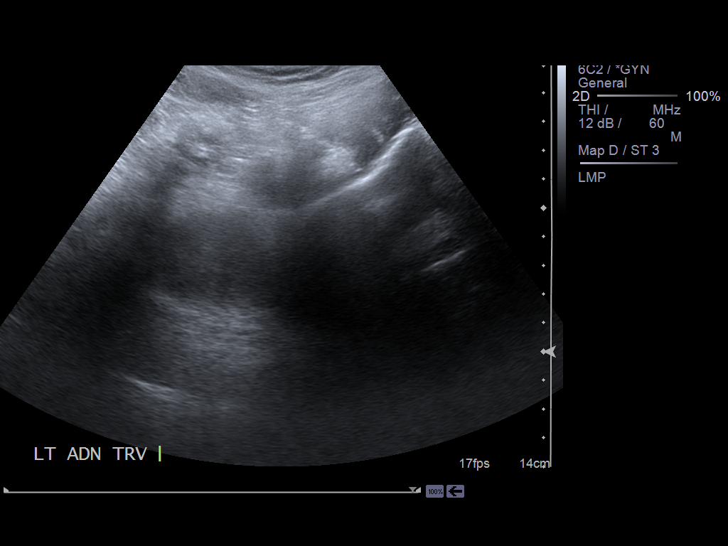
[im 9/50]
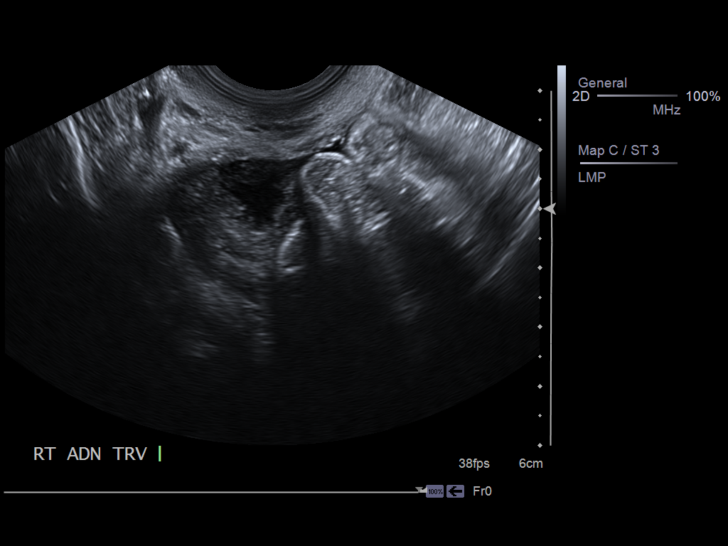
[im 13/50]
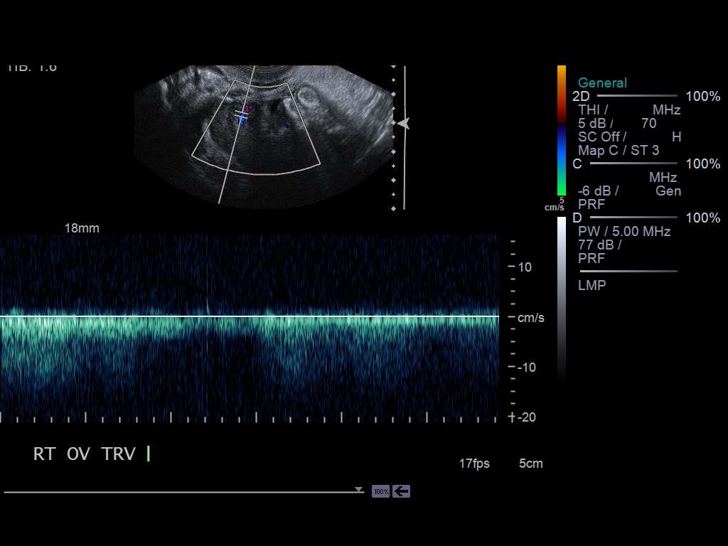
[im 17/50]
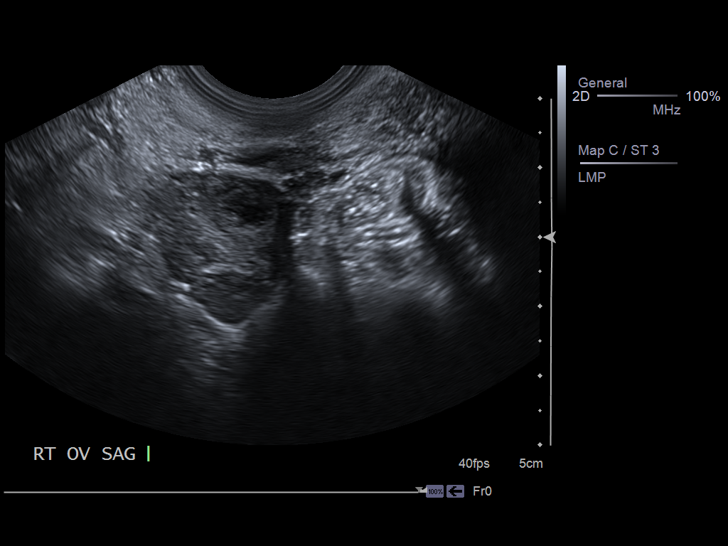
[im 21/50]
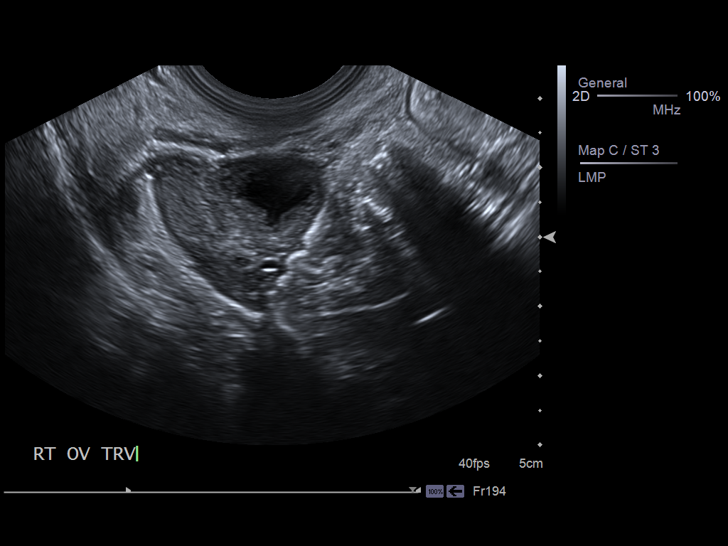
[im 25/50]
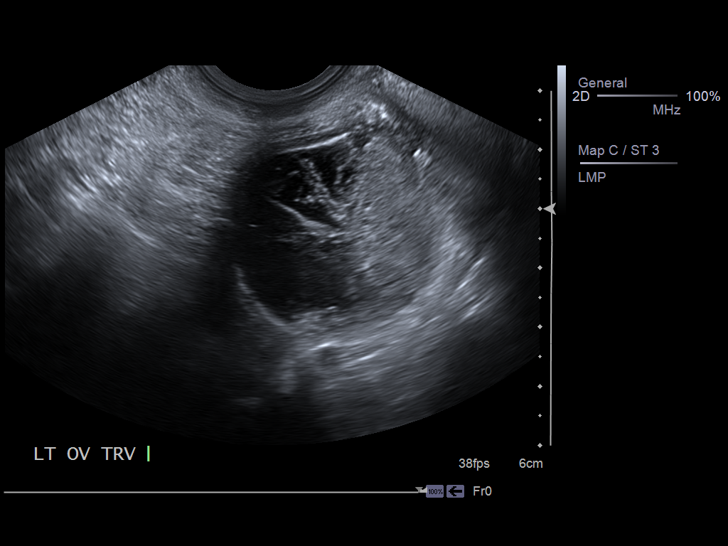
[im 29/50]
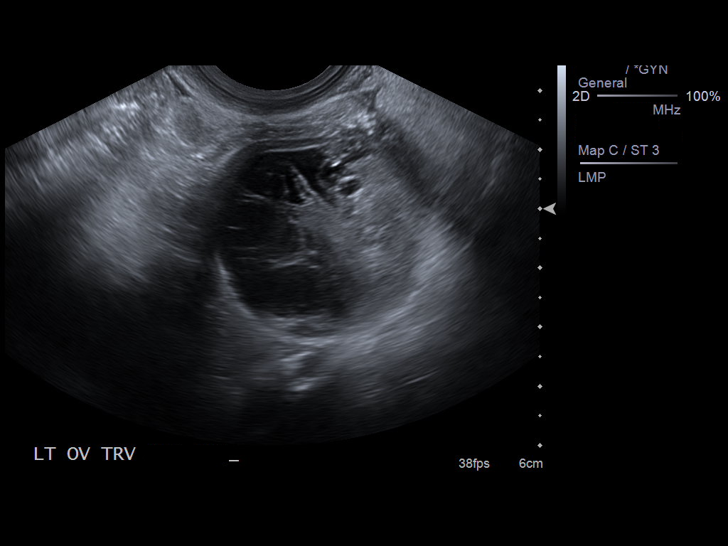
[im 33/50]
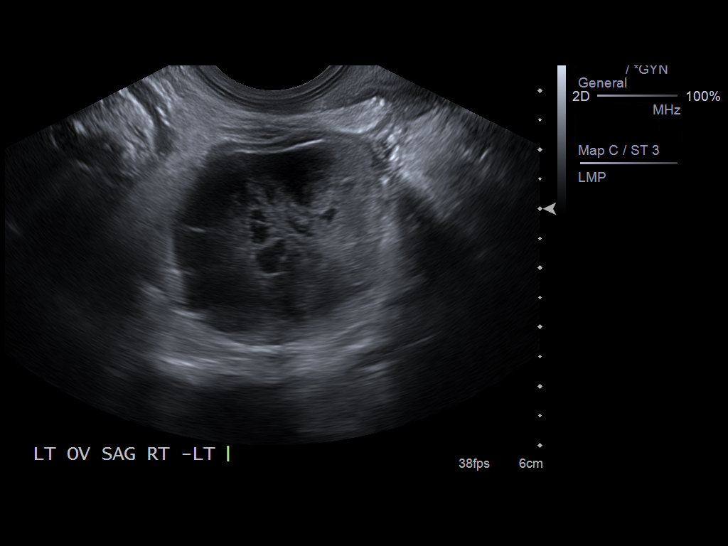
[im 37/50]
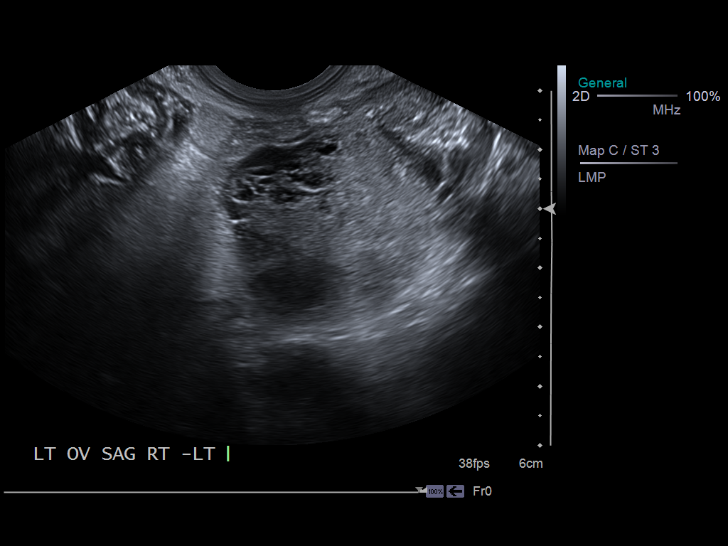
[im 41/50]
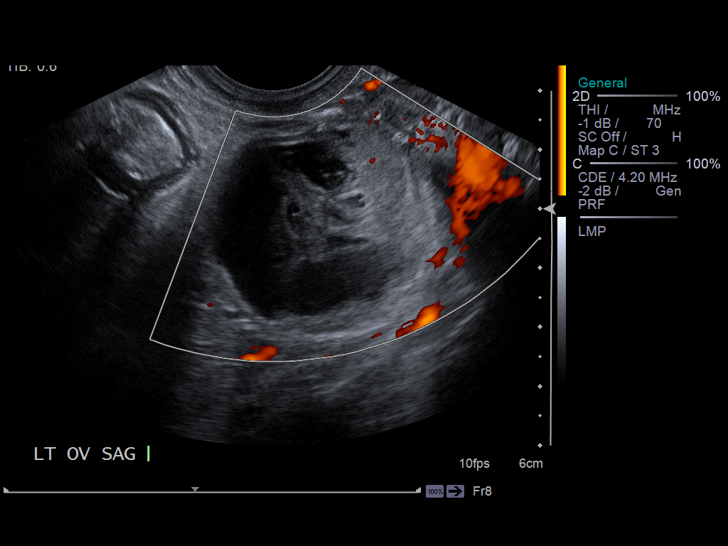
[im 45/50]
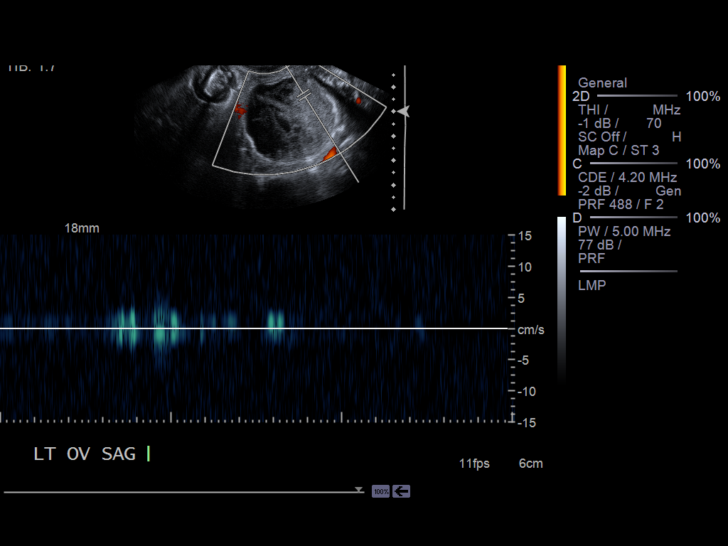
[im 50/50]
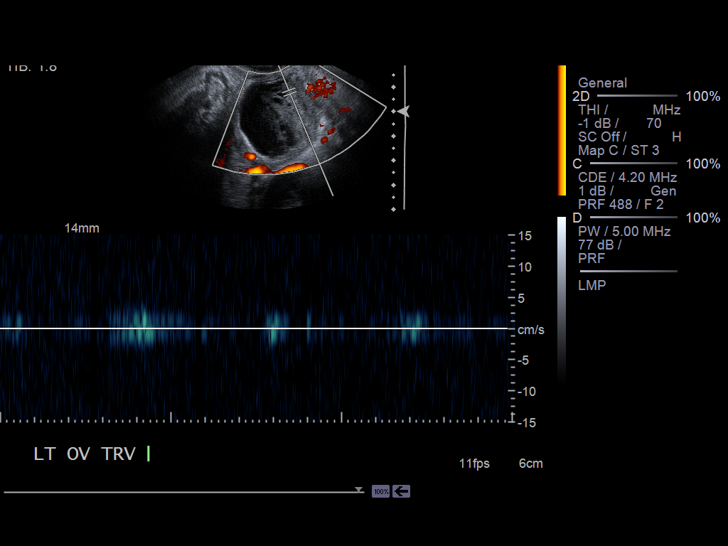

[13 of 25 positions shown; findings below may reference images not displayed]

FINDINGS: Uterus:  Surgically absent.  No mass at the vaginal cuff.

Endometrium:  Surgically absent.

Right ovary: 2.4 x 2.4 x 2.0 cm, with an internal collapsing
physiologic cyst measuring 1.3 x 1.0 x 0.9 cm.

Left ovary:    4.1 x 3.9 x 3.7 cm.  Internal hemorrhagic cyst with
lace-like internal echoes and internal retractile clot measures
x 3.4 x 2.6 cm.  This corresponds to the finding on the recent
previous exam.

Pulsed Doppler evaluation demonstrates normal low-resistance
arterial and venous waveforms in both ovaries.

No free fluid.
IMPRESSION: Left greater than right physiologic cysts, with imaging appearance
on the left typical for a hemorrhagic cyst.  Short-term follow-up
ultrasound is recommended in 6-8 weeks to document resolution.

No sonographic evidence for ovarian torsion.

## 2011-05-05 MED ORDER — HYDROMORPHONE HCL 1 MG/ML IJ SOLN
1.0000 mg | Freq: Once | INTRAMUSCULAR | Status: AC
  Administered 2011-05-05: 1 mg via INTRAVENOUS
  Filled 2011-05-05: qty 1

## 2011-05-05 MED ORDER — KETOROLAC TROMETHAMINE 30 MG/ML IJ SOLN
30.0000 mg | Freq: Once | INTRAMUSCULAR | Status: AC
  Administered 2011-05-05: 30 mg via INTRAVENOUS
  Filled 2011-05-05 (×2): qty 1

## 2011-05-05 MED ORDER — OXYCODONE-ACETAMINOPHEN 5-325 MG PO TABS: 1.0000 | ORAL_TABLET | Freq: Four times a day (QID) | ORAL | Status: AC | PRN

## 2011-05-05 NOTE — Progress Notes (Signed)
6440 Assumed care/disposition of patient. She is awaiting Vaginal [place] to r/o ovarian torsion. CT of abdomen revealed what appeared to be ovarian cyst.  0212 US shows ovarian cyst, no torsion. Patient given additional analgesic. She has been advised of US findings. Ct Abdomen Pelvis Wo Contrast  05/04/2011  *RADIOLOGY REPORT*  Clinical Data: Left lower quadrant/left flank pain.  CT ABDOMEN AND PELVIS WITHOUT CONTRAST  Technique:  Multidetector CT imaging of the abdomen and pelvis was performed following the standard protocol without intravenous contrast.  Comparison: 08/16/2007  Findings: Respiratory motion degrades lung base evaluation.  Mild atelectasis and/or scarring.  Heart size normal.  No pleural or pericardial effusion.  Abdominal evaluation is limited without intravenous contrast. Within this limitation, unremarkable liver, spleen, pancreas, and adrenal glands.  Status post cholecystectomy.  No biliary ductal dilatation.  The kidneys are symmetric in size.  There is a tiny interpolar right renal stone.  No hydronephrosis or hydroureter.  No bowel obstruction.  No CT evidence for colitis.  Appendix is within normal limits.  No free intraperitoneal air or fluid.  Small fat containing umbilical hernia.  3.9 cm cystic left adnexal lesion with a questionable fluid fluid level.  No free fluid within the pelvis.  Decompressed bladder. Uterus absent.  No right adnexal abnormality.  No lymphadenopathy.  Normal caliber vasculature.  No acute osseous abnormality.  IMPRESSION: 3.9 cm left adnexal lesion is favored to represent a mildly complex (hemorrhagic) ovarian cyst. If remains symptomatic, consider a follow-up pelvic ultrasound in 6 weeks.  Otherwise, no acute abnormality identified.  Original Report Authenticated By: Waneta Martins, M.D.   US Transvaginal Non-ob  05/05/2011  *RADIOLOGY REPORT*  Clinical Data:  Left-sided pelvic pain  TRANSABDOMINAL AND TRANSVAGINAL ULTRASOUND OF PELVIS DOPPLER ULTRASOUND  OF OVARIES  Technique:  Both transabdominal and transvaginal ultrasound examinations of the pelvis were performed. Transabdominal technique was performed for global imaging of the pelvis including uterus, ovaries, adnexal regions, and pelvic cul-de-sac.  It was necessary to proceed with endovaginal exam following the transabdominal exam to visualize the ovaries, neither seen transabdominally.  Color and duplex Doppler ultrasound was utilized to evaluate blood flow to the ovaries.  Comparison:  CT 05/04/2011  Findings:  Uterus:  Surgically absent.  No mass at the vaginal cuff.  Endometrium:  Surgically absent.  Right ovary: 2.4 x 2.4 x 2.0 cm, with an internal collapsing physiologic cyst measuring 1.3 x 1.0 x 0.9 cm.  Left ovary:    4.1 x 3.9 x 3.7 cm.  Internal hemorrhagic cyst with lace-like internal echoes and internal retractile clot measures 3.5 x 3.4 x 2.6 cm.  This corresponds to the finding on the recent previous exam.  Pulsed Doppler evaluation demonstrates normal low-resistance arterial and venous waveforms in both ovaries.  No free fluid.  IMPRESSION: Left greater than right physiologic cysts, with imaging appearance on the left typical for a hemorrhagic cyst.  Short-term follow-up ultrasound is recommended in 6-8 weeks to document resolution.  No sonographic evidence for ovarian torsion.  Original Report Authenticated By: Harrel Lemon, M.D.   US Pelvis Complete  05/05/2011  *RADIOLOGY REPORT*  Clinical Data:  Left-sided pelvic pain  TRANSABDOMINAL AND TRANSVAGINAL ULTRASOUND OF PELVIS DOPPLER ULTRASOUND OF OVARIES  Technique:  Both transabdominal and transvaginal ultrasound examinations of the pelvis were performed. Transabdominal technique was performed for global imaging of the pelvis including uterus, ovaries, adnexal regions, and pelvic cul-de-sac.  It was necessary to proceed with endovaginal exam following the transabdominal exam to visualize  the ovaries, neither seen transabdominally.   Color and duplex Doppler ultrasound was utilized to evaluate blood flow to the ovaries.  Comparison:  CT 05/04/2011  Findings:  Uterus:  Surgically absent.  No mass at the vaginal cuff.  Endometrium:  Surgically absent.  Right ovary: 2.4 x 2.4 x 2.0 cm, with an internal collapsing physiologic cyst measuring 1.3 x 1.0 x 0.9 cm.  Left ovary:    4.1 x 3.9 x 3.7 cm.  Internal hemorrhagic cyst with lace-like internal echoes and internal retractile clot measures 3.5 x 3.4 x 2.6 cm.  This corresponds to the finding on the recent previous exam.  Pulsed Doppler evaluation demonstrates normal low-resistance arterial and venous waveforms in both ovaries.  No free fluid.  IMPRESSION: Left greater than right physiologic cysts, with imaging appearance on the left typical for a hemorrhagic cyst.  Short-term follow-up ultrasound is recommended in 6-8 weeks to document resolution.  No sonographic evidence for ovarian torsion.  Original Report Authenticated By: Harrel Lemon, M.D.   [place] Art/ven Flow Abd Pelv Doppler  05/05/2011  *RADIOLOGY REPORT*  Clinical Data:  Left-sided pelvic pain  TRANSABDOMINAL AND TRANSVAGINAL ULTRASOUND OF PELVIS DOPPLER ULTRASOUND OF OVARIES  Technique:  Both transabdominal and transvaginal ultrasound examinations of the pelvis were performed. Transabdominal technique was performed for global imaging of the pelvis including uterus, ovaries, adnexal regions, and pelvic cul-de-sac.  It was necessary to proceed with endovaginal exam following the transabdominal exam to visualize the ovaries, neither seen transabdominally.  Color and duplex Doppler ultrasound was utilized to evaluate blood flow to the ovaries.  Comparison:  CT 05/04/2011  Findings:  Uterus:  Surgically absent.  No mass at the vaginal cuff.  Endometrium:  Surgically absent.  Right ovary: 2.4 x 2.4 x 2.0 cm, with an internal collapsing physiologic cyst measuring 1.3 x 1.0 x 0.9 cm.  Left ovary:    4.1 x 3.9 x 3.7 cm.  Internal hemorrhagic  cyst with lace-like internal echoes and internal retractile clot measures 3.5 x 3.4 x 2.6 cm.  This corresponds to the finding on the recent previous exam.  Pulsed Doppler evaluation demonstrates normal low-resistance arterial and venous waveforms in both ovaries.  No free fluid.  IMPRESSION: Left greater than right physiologic cysts, with imaging appearance on the left typical for a hemorrhagic cyst.  Short-term follow-up ultrasound is recommended in 6-8 weeks to document resolution.  No sonographic evidence for ovarian torsion.  Original Report Authenticated By: Harrel Lemon, M.D.  Pt stable in ED with no significant deterioration in condition.Patient   informed of clinical course, understand medical decision-making process, and agree with plan.

## 2011-05-05 NOTE — ED Notes (Signed)
Patient to radiology via stretcher for ultrasound.

## 2011-05-05 NOTE — ED Notes (Signed)
Into room to see patient. In bed resting on back. Husband at bedside. Pain 9\10. Notified no new medication order has been written. Verbalized understanding. Denies any needs. Call bell at bedside.

## 2011-05-05 NOTE — ED Notes (Signed)
MD at bedside to discuss plan of care

## 2011-05-05 NOTE — ED Notes (Signed)
MD at bedside. Resting in bed on left side. Pain 7/10. No nausea. No distress. Call bell and husband at bedside.

## 2011-05-05 NOTE — ED Notes (Signed)
Patient back to room from radiology. Pain 9/10 at this time. Denies nausea. Call bell and husband at bedside. MD aware.

## 2011-05-05 NOTE — ED Notes (Signed)
Medicated as ordered for 9\10 pain. Denies any needs. Resting on left side. Husband at bedside. Call bell at bedside. Bed in low position and locked with side rails up. Awaiting ultrasound.

## 2011-06-12 LAB — BASIC METABOLIC PANEL
BUN: 6
CO2: 20
Calcium: 9.2
Chloride: 107
Creatinine, Ser: 0.63
GFR calc Af Amer: >60
GFR calc non Af Amer: >60
Glucose, Bld: 90
Potassium: 3.1 — ABNORMAL LOW
Sodium: 137

## 2011-06-12 LAB — URINE MICROSCOPIC-ADD ON

## 2011-06-12 LAB — URINALYSIS, ROUTINE W REFLEX MICROSCOPIC
Bilirubin Urine: NEGATIVE
Glucose, UA: NEGATIVE
Ketones, ur: NEGATIVE
Leukocytes, UA: NEGATIVE
Nitrite: NEGATIVE
Protein, ur: NEGATIVE
Specific Gravity, Urine: <1.005 — ABNORMAL LOW
Urobilinogen, UA: 0.2
pH: 5.5

## 2011-06-12 LAB — CBC
HCT: 37.6
Hemoglobin: 12.4
MCHC: 32.9
MCV: 82.2
Platelets: 330
RBC: 4.57
RDW: 16.1 — ABNORMAL HIGH
WBC: 10.7 — ABNORMAL HIGH

## 2011-06-12 LAB — DIFFERENTIAL
Basophils Absolute: 0.1
Basophils Relative: 1
Eosinophils Absolute: 0.1 — ABNORMAL LOW
Eosinophils Relative: 1
Lymphocytes Relative: 30
Lymphs Abs: 3.2
Monocytes Absolute: 0.6
Monocytes Relative: 6
Neutro Abs: 6.7
Neutrophils Relative %: 63

## 2011-06-12 LAB — PREGNANCY, URINE: Preg Test, Ur: NEGATIVE

## 2011-08-05 ENCOUNTER — Emergency Department (HOSPITAL_COMMUNITY): Payer: Self-pay

## 2011-08-05 ENCOUNTER — Encounter (HOSPITAL_COMMUNITY): Payer: Self-pay

## 2011-08-05 ENCOUNTER — Emergency Department (HOSPITAL_COMMUNITY)
Admission: EM | Admit: 2011-08-05 | Discharge: 2011-08-06 | Disposition: A | Discharge status: Home / Self Care | Payer: Self-pay | Attending: Emergency Medicine | Admitting: Emergency Medicine

## 2011-08-05 DIAGNOSIS — F17200 Nicotine dependence, unspecified, uncomplicated: Secondary | ICD-10-CM | POA: Insufficient documentation

## 2011-08-05 DIAGNOSIS — Z9079 Acquired absence of other genital organ(s): Secondary | ICD-10-CM | POA: Insufficient documentation

## 2011-08-05 DIAGNOSIS — J189 Pneumonia, unspecified organism: Secondary | ICD-10-CM | POA: Insufficient documentation

## 2011-08-05 MED ORDER — ONDANSETRON HCL 4 MG/2ML IJ SOLN: 4.0000 mg | Freq: Once | INTRAMUSCULAR | Status: DC

## 2011-08-05 MED ORDER — OXYCODONE-ACETAMINOPHEN 5-325 MG PO TABS
1.0000 | ORAL_TABLET | Freq: Once | ORAL | Status: AC
  Administered 2011-08-06: 1 via ORAL
  Filled 2011-08-05: qty 1

## 2011-08-05 MED ORDER — ONDANSETRON 4 MG PO TBDP
4.0000 mg | ORAL_TABLET | Freq: Once | ORAL | Status: AC
  Administered 2011-08-06: 4 mg via ORAL
  Filled 2011-08-05: qty 1

## 2011-08-05 NOTE — ED Notes (Signed)
Pt presents with fever, cough, rib pain on right side, back pain and vomiting. Pt states symptoms started Tuesday. NAD at this time.

## 2011-08-05 NOTE — ED Provider Notes (Signed)
History     CSN: [id number] Arrival date & time: 08/05/2011 10:37 PM   First MD Initiated Contact with Patient 08/05/11 2324      Chief Complaint  Patient presents with  . Sore Throat  . Cough  . Emesis  . Chest Pain    Right side of ribs  . Fever  . Back Pain    (Consider location/radiation/quality/duration/timing/severity/associated sxs/prior treatment) Patient is a 35 y.o. female presenting with pharyngitis, cough, vomiting, chest pain, fever, and back pain. The history is provided by the patient.  Sore Throat Associated symptoms include chest pain and headaches.  Cough Associated symptoms include chest pain, chills and headaches.  Emesis  Associated symptoms include chills, cough, diarrhea, a fever and headaches.  Chest Pain Primary symptoms include a fever, fatigue, cough, nausea and vomiting.    Fever Primary symptoms of the febrile illness include fever, fatigue, headaches, cough, nausea, vomiting and diarrhea. Primary symptoms do not include dysuria.  Back Pain  Associated symptoms include chest pain, a fever and headaches. Pertinent negatives include no dysuria.   patient has had cough fever nausea vomiting diarrhea fever chills since Tuesday. States she's had a little production with the cough. She just feels miserable. She states that her son was diagnosed with pneumonia 2 days ago. She also has a sore throat. No relief with her medicines at home. She describes the chest pain is worse with coughing.  History reviewed. No pertinent past medical history.  Past Surgical History  Procedure Date  . Abdominal hysterectomy     Family History  Problem Relation Age of Onset  . Cancer Mother   . Diabetes Mother   . Hypertension Mother   . Cancer Father   . Diabetes Father   . Hypertension Father     History  Substance Use Topics  . Smoking status: Current Everyday Smoker -- 1.0 packs/day    Types: Cigarettes  . Smokeless tobacco: Not on file  . Alcohol Use:  No    OB History    Grav Para Term Preterm Abortions TAB SAB Ect Mult Living                  Review of Systems  Constitutional: Positive for fever, chills, appetite change and fatigue.  Respiratory: Positive for cough.   Cardiovascular: Positive for chest pain.  Gastrointestinal: Positive for nausea, vomiting and diarrhea.  Genitourinary: Negative for dysuria.  Musculoskeletal: Positive for back pain.  Neurological: Positive for headaches.    Allergies  Review of patient's allergies indicates no known allergies.  Home Medications   Current Outpatient Rx  Name Route Sig Dispense Refill  . LEVOFLOXACIN 500 MG PO TABS Oral Take 1 tablet (500 mg total) by mouth daily. 7 tablet 0  . OXYCODONE-ACETAMINOPHEN 5-325 MG PO TABS Oral Take 2 tablets by mouth every 4 (four) hours as needed for pain. 15 tablet 0  . PROMETHAZINE HCL 25 MG PO TABS Oral Take 1 tablet (25 mg total) by mouth every 6 (six) hours as needed for nausea. 30 tablet 0    BP 109/66  Pulse 106  Temp(Src) 98.3 F (36.8 C) (Oral)  Resp 20  Ht 5\' 7"  (1.702 m)  Wt 203 lb (92.08 kg)  BMI 31.79 kg/m2  SpO2 100%  Physical Exam  Nursing note and vitals reviewed. Constitutional: She is oriented to person, place, and time. She appears well-developed and well-nourished. No distress.  HENT:  Head: Normocephalic and atraumatic.  Mouth/Throat: No oropharyngeal exudate.  Mild posterior pharyngeal erythema  Eyes: EOM are normal. Pupils are equal, round, and reactive to light.  Neck: Normal range of motion. Neck supple.  Cardiovascular: Regular rhythm and normal heart sounds.   No murmur heard.      Mild tachycardia  Pulmonary/Chest: Effort normal. No respiratory distress. She has wheezes.       Mild wheezes at left base  Abdominal: Soft. Bowel sounds are normal. She exhibits no distension. There is no tenderness. There is no rebound and no guarding.  Musculoskeletal: Normal range of motion.  Lymphadenopathy:     She has cervical adenopathy.  Neurological: She is alert and oriented to person, place, and time. No cranial nerve deficit.  Skin: Skin is warm and dry. She is not diaphoretic.  Psychiatric: She has a normal mood and affect. Her speech is normal.    ED Course  Procedures (including critical care time)  Labs Reviewed - No data to display Dg Chest 2 View  08/06/2011  *RADIOLOGY REPORT*  Clinical Data: Cough; history of smoking.  CHEST - 2 VIEW  Comparison: Chest radiograph performed 05/05/2010  Findings: The lungs are well-aerated.  Bibasilar airspace opacities, right greater than left, raise concern for multifocal pneumonia.  There is no evidence of pleural effusion or pneumothorax.  The heart is normal in size; the mediastinal contour is within normal limits.  No acute osseous abnormalities are seen.  IMPRESSION: Bibasilar airspace opacities, right greater than left, concerning for multifocal pneumonia.  Original Report Authenticated By: Tonia Ghent, M.D.     1. CAP (community acquired pneumonia)       MDM  Patient presents to the ER with fever cough and trouble breathing. Some vomiting. Chest x-ray shows bilateral pneumonia. Patient states that she feels good and wants to give it a try at home. She has been overall tolerating orals. She was given a first dose of Levaquin here. She also given pain medicines and antiemetics. She'll followup with the health Department on Monday or back here sooner if needed. She'll be discharged home        Juliet Rude. Rubin Payor, MD 08/06/11 [phone]

## 2011-08-06 ENCOUNTER — Encounter (HOSPITAL_COMMUNITY): Payer: Self-pay | Admitting: *Deleted

## 2011-08-06 IMAGING — CR DG CHEST 2V
2 series · 2 of 2 positions shown · non-contrast
Comparison: Chest radiograph performed [DATE]

CLINICAL DATA: Cough; history of smoking.

CHEST - 2 VIEW

[view not recorded (1 of 2)]
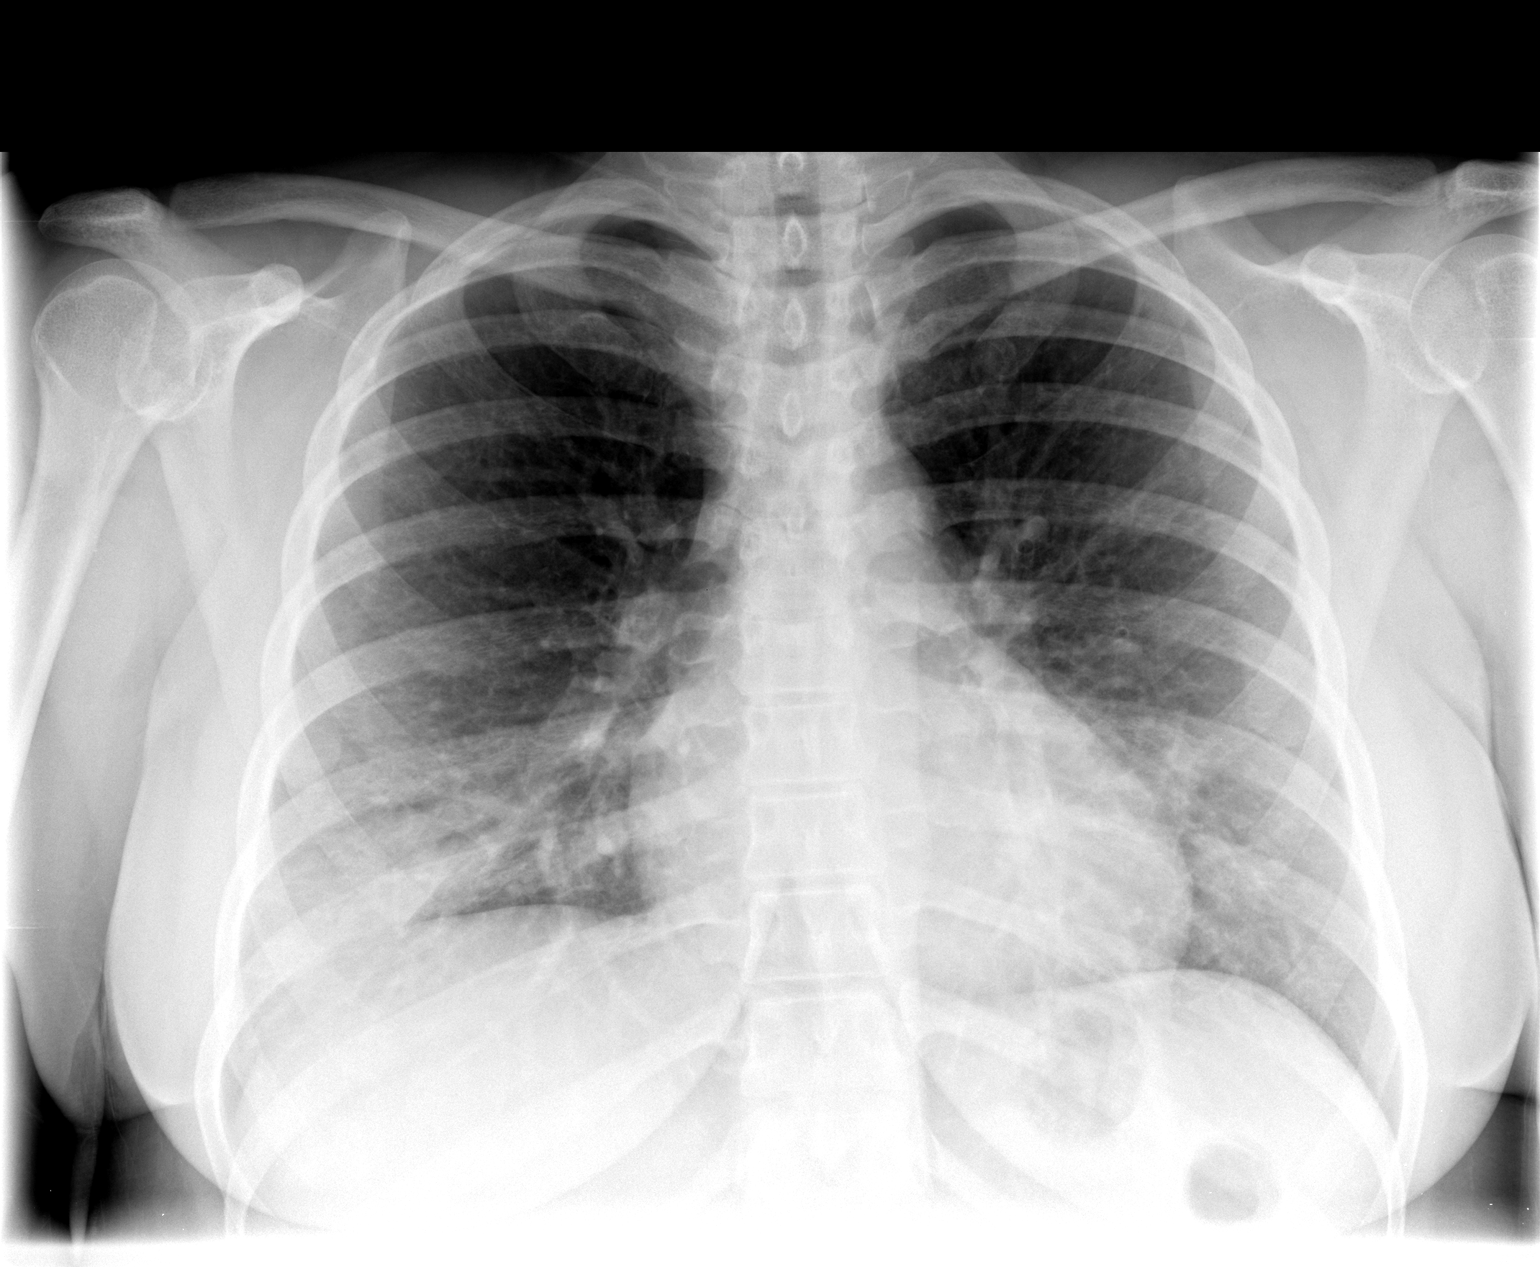

[view not recorded (2 of 2)]
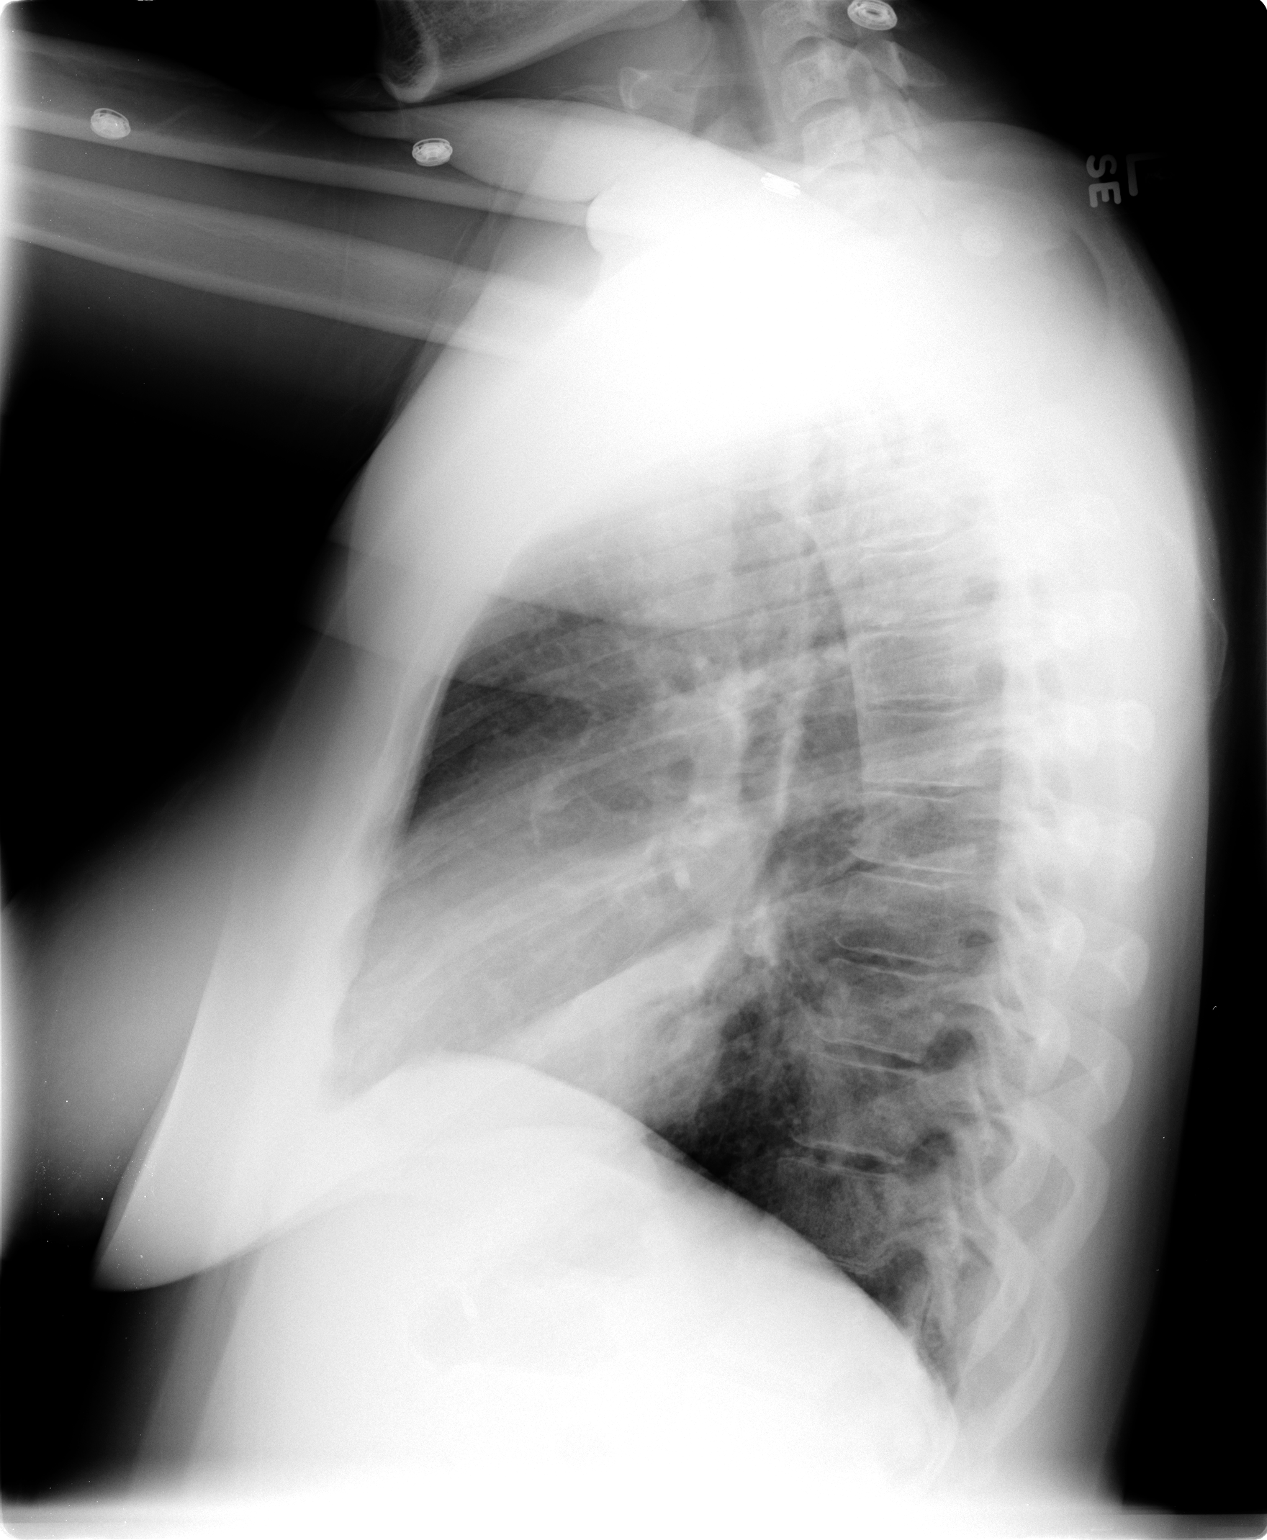

[2 of 2 positions shown; findings below may reference images not displayed]

FINDINGS: The lungs are well-aerated.  Bibasilar airspace
opacities, right greater than left, raise concern for multifocal
pneumonia.  There is no evidence of pleural effusion or
pneumothorax.

The heart is normal in size; the mediastinal contour is within
normal limits.  No acute osseous abnormalities are seen.
IMPRESSION: Bibasilar airspace opacities, right greater than left, concerning
for multifocal pneumonia.

## 2011-08-06 MED ORDER — PROMETHAZINE HCL 25 MG PO TABS: 25.0000 mg | ORAL_TABLET | Freq: Four times a day (QID) | ORAL | Status: DC | PRN

## 2011-08-06 MED ORDER — LEVOFLOXACIN 500 MG PO TABS: 500.0000 mg | ORAL_TABLET | Freq: Every day | ORAL | Status: AC

## 2011-08-06 MED ORDER — OXYCODONE-ACETAMINOPHEN 5-325 MG PO TABS: 2.0000 | ORAL_TABLET | ORAL | Status: AC | PRN

## 2011-08-06 MED ORDER — LEVOFLOXACIN 500 MG PO TABS
500.0000 mg | ORAL_TABLET | Freq: Once | ORAL | Status: AC
  Administered 2011-08-06: 500 mg via ORAL
  Filled 2011-08-06: qty 1

## 2012-01-24 ENCOUNTER — Emergency Department (HOSPITAL_COMMUNITY)
Admission: EM | Admit: 2012-01-24 | Discharge: 2012-01-24 | Disposition: A | Discharge status: Home / Self Care | Payer: Self-pay | Attending: Emergency Medicine | Admitting: Emergency Medicine

## 2012-01-24 ENCOUNTER — Encounter (HOSPITAL_COMMUNITY): Payer: Self-pay | Admitting: *Deleted

## 2012-01-24 DIAGNOSIS — F17200 Nicotine dependence, unspecified, uncomplicated: Secondary | ICD-10-CM | POA: Insufficient documentation

## 2012-01-24 DIAGNOSIS — L03113 Cellulitis of right upper limb: Secondary | ICD-10-CM

## 2012-01-24 DIAGNOSIS — L02519 Cutaneous abscess of unspecified hand: Secondary | ICD-10-CM | POA: Insufficient documentation

## 2012-01-24 DIAGNOSIS — L03119 Cellulitis of unspecified part of limb: Secondary | ICD-10-CM | POA: Insufficient documentation

## 2012-01-24 MED ORDER — CEFTRIAXONE SODIUM 1 G IJ SOLR
1.0000 g | Freq: Once | INTRAMUSCULAR | Status: AC
  Administered 2012-01-24: 1 g via INTRAMUSCULAR
  Filled 2012-01-24: qty 10

## 2012-01-24 MED ORDER — DOXYCYCLINE HYCLATE 100 MG PO TABS
100.0000 mg | ORAL_TABLET | Freq: Once | ORAL | Status: AC
  Administered 2012-01-24: 100 mg via ORAL
  Filled 2012-01-24: qty 1

## 2012-01-24 MED ORDER — LIDOCAINE HCL (PF) 1 % IJ SOLN
INTRAMUSCULAR | Status: AC
  Administered 2012-01-24: 2.1 mL
  Filled 2012-01-24: qty 5

## 2012-01-24 MED ORDER — DOXYCYCLINE HYCLATE 100 MG PO CAPS: 100.0000 mg | ORAL_CAPSULE | Freq: Two times a day (BID) | ORAL | Status: AC

## 2012-01-24 MED ORDER — HYDROCODONE-ACETAMINOPHEN 5-325 MG PO TABS: 1.0000 | ORAL_TABLET | Freq: Four times a day (QID) | ORAL | Status: AC | PRN

## 2012-01-24 NOTE — Discharge Instructions (Signed)
Cellulitis Cellulitis is an infection of the tissue under the skin. The infected area is usually red and tender. This is caused by germs. These germs enter the body through cuts or sores. This usually happens in the arms or lower legs. HOME CARE   Take your medicine as told. Finish it even if you start to feel better.   If the infection is on the arm or leg, keep it raised (elevated).   Use a warm cloth on the infected area several times a day.   See your doctor for a follow-up visit as told.  GET HELP RIGHT AWAY IF:   You are tired or confused.   You throw up (vomit).   You have watery poop (diarrhea).   You feel ill and have muscle aches.   You have a fever.  MAKE SURE YOU:   Understand these instructions.   Will watch your condition.   Will get help right away if you are not doing well or get worse.  Document Released: 02/07/2008 Document Revised: 08/10/2011 Document Reviewed: 07/23/2009 [facility] Patient Information 2012 [place], [place].   Take the meds as directed.  Take ibuprofen up to 800 mg every 8 hrs with food.  Apply warm compresses several times daily.  Return if any problems.

## 2012-01-24 NOTE — ED Provider Notes (Signed)
History     CSN: [id number]  Arrival date & time 01/24/12  1318   First MD Initiated Contact with Patient 01/24/12 1339      Chief Complaint  Patient presents with  . Hand Pain    (Consider location/radiation/quality/duration/timing/severity/associated sxs/prior treatment) HPI Comments: Pt noted a small "pimple" on R 4th finger yest and squeezed it.  Pain, redness and swelling present today.  Patient is a 36 y.o. female presenting with hand pain. The history is provided by the patient. No language interpreter was used.  Hand Pain This is a new problem. The current episode started yesterday. The problem occurs constantly. The problem has been gradually worsening. Pertinent negatives include no chills or fever. Exacerbated by: movement and palpation. She has tried nothing for the symptoms.    History reviewed. No pertinent past medical history.  Past Surgical History  Procedure Date  . Abdominal hysterectomy     Family History  Problem Relation Age of Onset  . Cancer Mother   . Diabetes Mother   . Hypertension Mother   . Cancer Father   . Diabetes Father   . Hypertension Father     History  Substance Use Topics  . Smoking status: Current Everyday Smoker -- 1.0 packs/day    Types: Cigarettes  . Smokeless tobacco: Not on file  . Alcohol Use: No    OB History    Grav Para Term Preterm Abortions TAB SAB Ect Mult Living                  Review of Systems  Constitutional: Negative for fever and chills.  Musculoskeletal:       Finger/hand pain   All other systems reviewed and are negative.    Allergies  Review of patient's allergies indicates no known allergies.  Home Medications   Current Outpatient Rx  Name Route Sig Dispense Refill  . FERROUS SULFATE 325 (65 FE) MG PO TABS Oral Take 325 mg by mouth at bedtime.    Marland Kitchen DOXYCYCLINE HYCLATE 100 MG PO CAPS Oral Take 1 capsule (100 mg total) by mouth 2 (two) times daily. 20 capsule 0  .  HYDROCODONE-ACETAMINOPHEN 5-325 MG PO TABS Oral Take 1 tablet by mouth every 6 (six) hours as needed for pain. 20 tablet 0    BP 120/75  Pulse 105  Temp(Src) 98.4 F (36.9 C) (Oral)  Resp 20  Ht 5\' 7"  (1.702 m)  Wt 180 lb (81.647 kg)  BMI 28.19 kg/m2  SpO2 98%  Physical Exam  Nursing note and vitals reviewed. Constitutional: She is oriented to person, place, and time. She appears well-developed and well-nourished. No distress.  HENT:  Head: Normocephalic and atraumatic.  Eyes: EOM are normal.  Neck: Normal range of motion.  Cardiovascular: Normal rate, regular rhythm and normal heart sounds.   Pulmonary/Chest: Effort normal and breath sounds normal.  Abdominal: Soft. She exhibits no distension. There is no tenderness.  Musculoskeletal: Normal range of motion. She exhibits tenderness.       Right hand: She exhibits tenderness and swelling. She exhibits normal range of motion, no deformity and no laceration. normal sensation noted. Normal strength noted.       Hands: Neurological: She is alert and oriented to person, place, and time.  Skin: Skin is warm and dry.  Psychiatric: She has a normal mood and affect. Judgment normal.    ED Course  Procedures (including critical care time)  Labs Reviewed - No data to display No results found.  1. Cellulitis of right hand       MDM  rx- doxycycline 100 mg BID, #20 Warm compresses.        Worthy Rancher, PA 01/24/12 1409

## 2012-01-24 NOTE — ED Notes (Signed)
Pt states that she noticed a "pimple" to right hand yesterday, "popped it" and swelling became worse, pt has redness and swelling noted to right hand area,

## 2012-01-25 NOTE — ED Provider Notes (Signed)
Medical screening examination/treatment/procedure(s) were conducted as a shared visit with non-physician practitioner(s) and myself.  I personally evaluated the patient during the encounter.  Exam shows right hand cellulitis..  Does not need to be admitted. Discharge home with by mouth antibiotics  Donnetta Hutching, MD 01/25/12 [phone]

## 2012-08-11 ENCOUNTER — Emergency Department (HOSPITAL_COMMUNITY)
Admission: EM | Admit: 2012-08-11 | Discharge: 2012-08-11 | Disposition: A | Discharge status: Home / Self Care | Payer: Self-pay | Attending: Emergency Medicine | Admitting: Emergency Medicine

## 2012-08-11 ENCOUNTER — Emergency Department (HOSPITAL_COMMUNITY): Payer: Self-pay

## 2012-08-11 ENCOUNTER — Encounter (HOSPITAL_COMMUNITY): Payer: Self-pay | Admitting: *Deleted

## 2012-08-11 DIAGNOSIS — F17200 Nicotine dependence, unspecified, uncomplicated: Secondary | ICD-10-CM | POA: Insufficient documentation

## 2012-08-11 DIAGNOSIS — M25529 Pain in unspecified elbow: Secondary | ICD-10-CM | POA: Insufficient documentation

## 2012-08-11 DIAGNOSIS — R29898 Other symptoms and signs involving the musculoskeletal system: Secondary | ICD-10-CM | POA: Insufficient documentation

## 2012-08-11 DIAGNOSIS — M25522 Pain in left elbow: Secondary | ICD-10-CM

## 2012-08-11 DIAGNOSIS — Z9889 Other specified postprocedural states: Secondary | ICD-10-CM | POA: Insufficient documentation

## 2012-08-11 IMAGING — CR DG ELBOW COMPLETE 3+V*L*
4 series · 4 of 4 positions shown · non-contrast
Comparison: None

CLINICAL DATA: Left elbow pain.

LEFT ELBOW - COMPLETE 3+ VIEW

[view not recorded (1 of 4)]
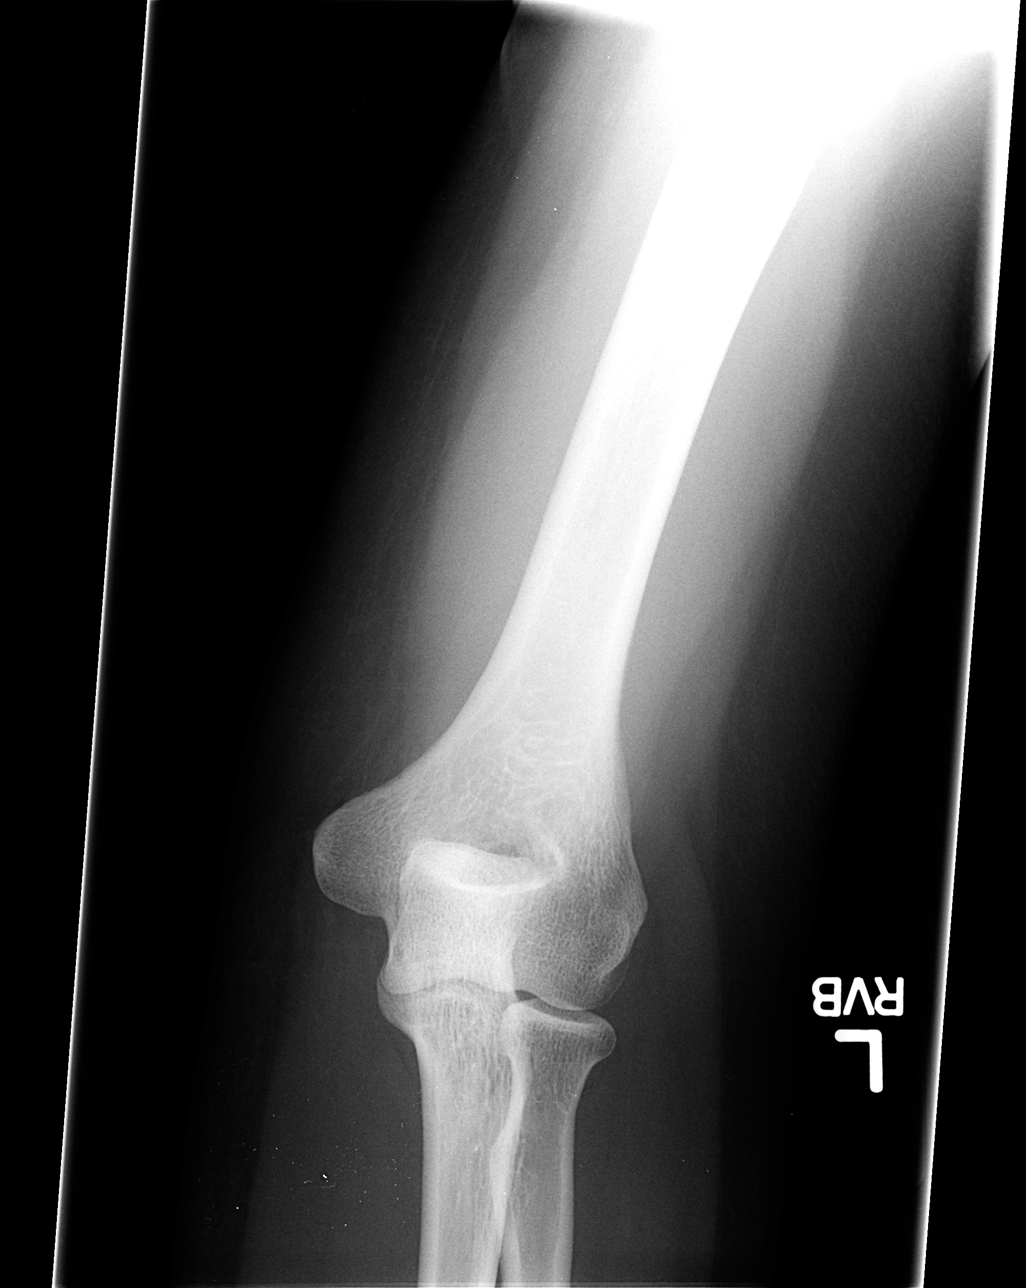

[view not recorded (2 of 4)]
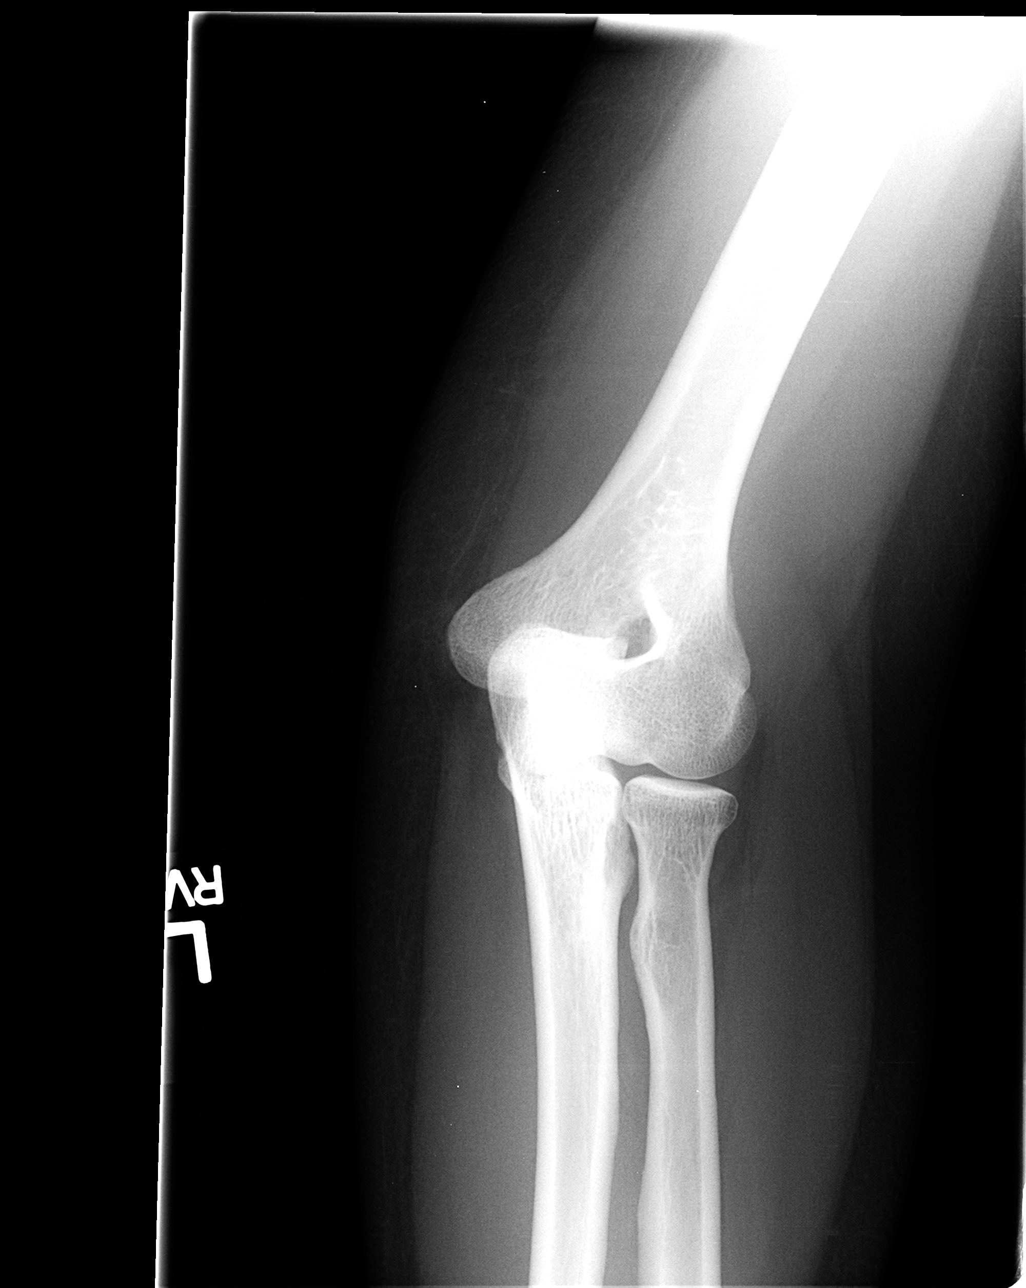

[view not recorded (3 of 4)]
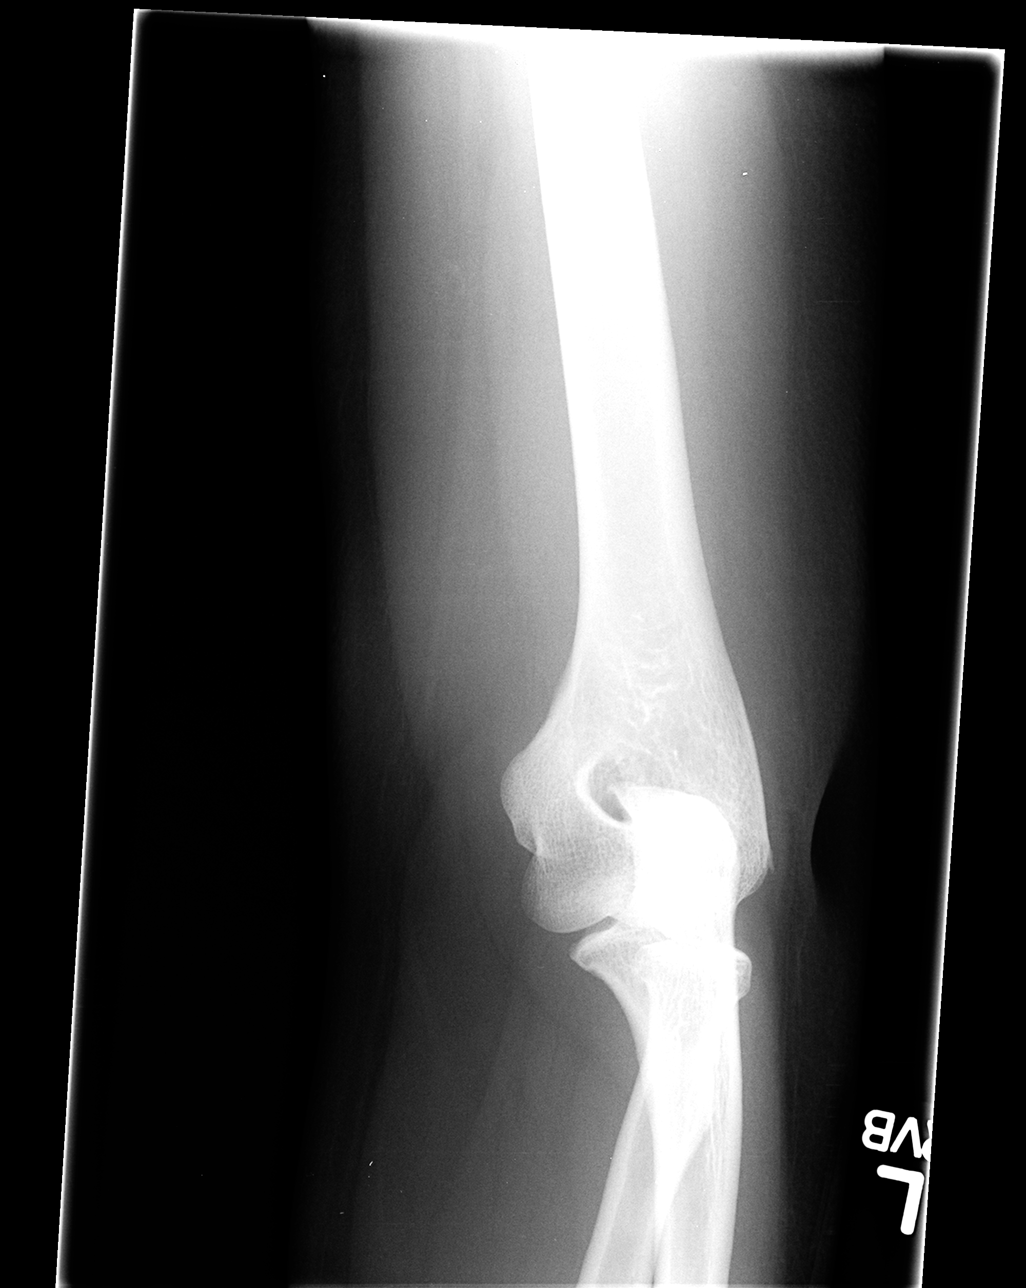

[view not recorded (4 of 4)]
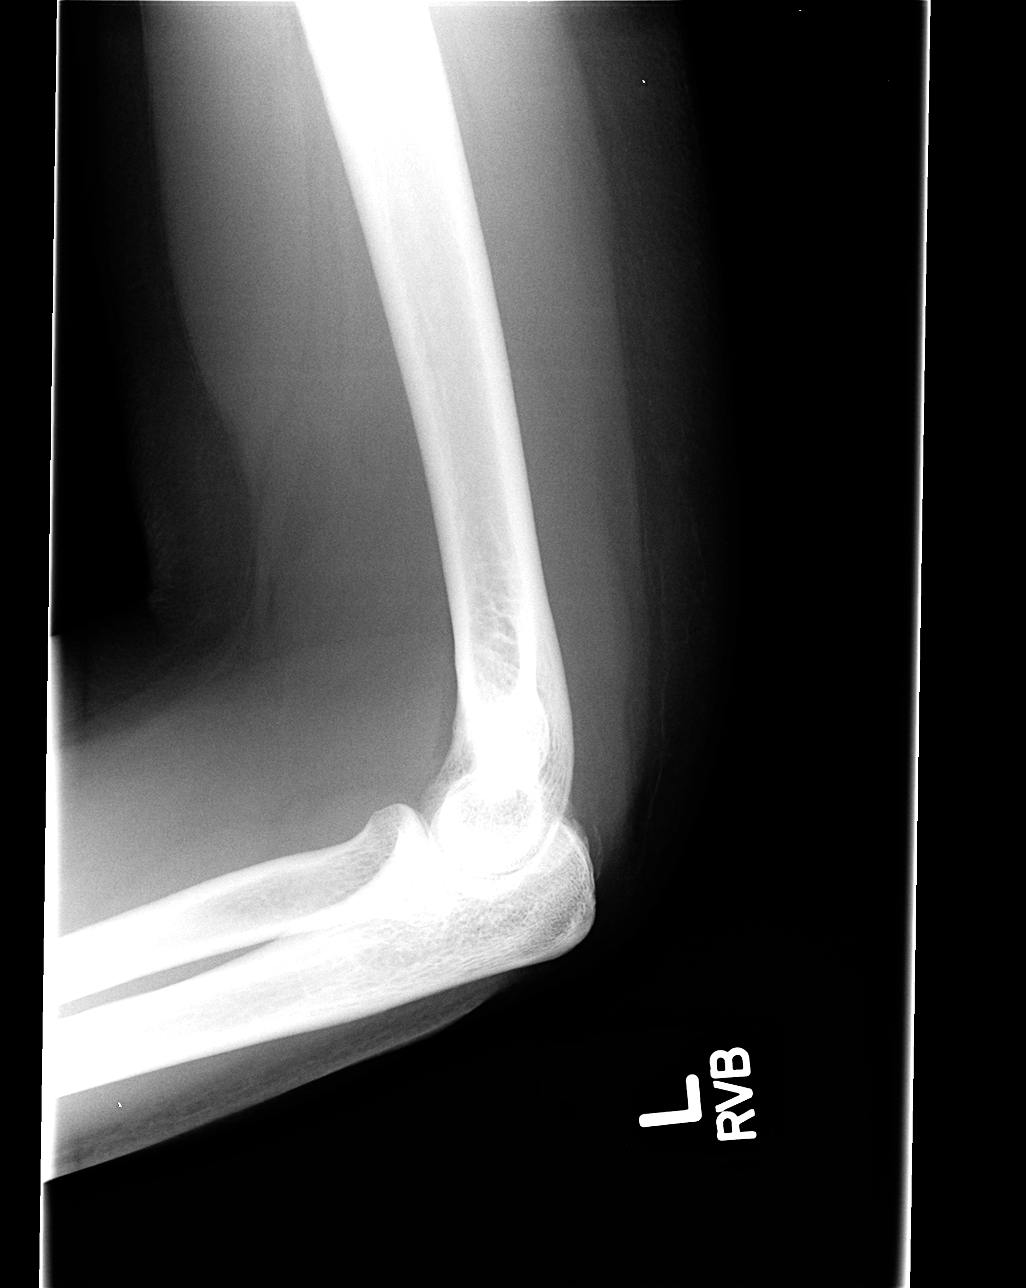

[4 of 4 positions shown; findings below may reference images not displayed]

FINDINGS: The joint spaces are maintained.  No acute fracture or
osteochondral lesion.  No joint effusion.  Small bony density near
the triceps attachment on the olecranon could be due to calcific
tendonitis or a small avulsion.
IMPRESSION: 1.  Distal triceps calcific tendonitis versus small avulsion.
2.  No joint effusion or osteochondral abnormality.

## 2012-08-11 MED ORDER — HYDROCODONE-ACETAMINOPHEN 5-325 MG PO TABS: 1.0000 | ORAL_TABLET | Freq: Four times a day (QID) | ORAL | Status: AC | PRN

## 2012-08-11 MED ORDER — IBUPROFEN 800 MG PO TABS
800.0000 mg | ORAL_TABLET | Freq: Once | ORAL | Status: AC
  Administered 2012-08-11: 800 mg via ORAL
  Filled 2012-08-11: qty 1

## 2012-08-11 MED ORDER — HYDROCODONE-ACETAMINOPHEN 5-325 MG PO TABS
1.0000 | ORAL_TABLET | Freq: Once | ORAL | Status: AC
  Administered 2012-08-11: 1 via ORAL
  Filled 2012-08-11: qty 1

## 2012-08-11 NOTE — ED Notes (Signed)
Patient with no complaints at this time. Respirations even and unlabored. Skin warm/dry. Discharge instructions reviewed with patient at this time. Patient given opportunity to voice concerns/ask questions. Patient discharged at this time and left Emergency Department with steady gait.   

## 2012-08-11 NOTE — ED Notes (Signed)
Left arm pain x 4 days.  Denies injury.  Pain worse with movement.

## 2012-08-11 NOTE — ED Notes (Signed)
L elbow very tender to light touch, radiating up arm to shoulder.  No erythema, questionable edema, no warmth.  Denies overuse or prolonged pressure of joint

## 2012-08-11 NOTE — ED Provider Notes (Signed)
History     CSN: [id number]  Arrival date & time 08/11/12  1409   First MD Initiated Contact with Patient 08/11/12 1440      Chief Complaint  Patient presents with  . Arm Pain    (Consider location/radiation/quality/duration/timing/severity/associated sxs/prior treatment) HPI Comments: Does not recall any trauma to elbow.  No fever or chills.  R hand dominant.  Patient is a 36 y.o. female presenting with arm pain. The history is provided by the patient. No language interpreter was used.  Arm Pain This is a new problem. Episode onset: 2 days ago. The problem occurs constantly. The problem has been unchanged. Pertinent negatives include no chills, fever, numbness or weakness. Exacerbated by: palpation, flexion. She has tried NSAIDs for the symptoms. The treatment provided no relief.    History reviewed. No pertinent past medical history.  Past Surgical History  Procedure Date  . Abdominal hysterectomy   . Cholecystectomy     Family History  Problem Relation Age of Onset  . Cancer Mother   . Diabetes Mother   . Hypertension Mother   . Cancer Father   . Diabetes Father   . Hypertension Father     History  Substance Use Topics  . Smoking status: Current Every Day Smoker -- 1.0 packs/day    Types: Cigarettes  . Smokeless tobacco: Not on file  . Alcohol Use: No    OB History    Grav Para Term Preterm Abortions TAB SAB Ect Mult Living                  Review of Systems  Constitutional: Negative for fever and chills.  Musculoskeletal:       Elbow pain   Neurological: Negative for weakness and numbness.  All other systems reviewed and are negative.    Allergies  Review of patient's allergies indicates no known allergies.  Home Medications  No current outpatient prescriptions on file.  BP 97/52  Pulse 80  Temp 97.9 F (36.6 C) (Oral)  Resp 17  SpO2 100%  Physical Exam  Nursing note and vitals reviewed. Constitutional: She is oriented to person,  place, and time. She appears well-developed and well-nourished. No distress.  HENT:  Head: Normocephalic and atraumatic.  Eyes: EOM are normal.  Neck: Normal range of motion.  Cardiovascular: Normal rate and regular rhythm.   Pulmonary/Chest: Effort normal.  Abdominal: Soft. She exhibits no distension. There is no tenderness.  Musculoskeletal: She exhibits tenderness.       Left elbow: She exhibits decreased range of motion. She exhibits no swelling, no effusion, no deformity and no laceration. tenderness found. Olecranon process tenderness noted.       Arms: Neurological: She is alert and oriented to person, place, and time.  Skin: Skin is warm and dry.  Psychiatric: She has a normal mood and affect. Judgment normal.    ED Course  Procedures (including critical care time)  Labs Reviewed - No data to display Dg Elbow Complete Left  08/11/2012  *RADIOLOGY REPORT*  Clinical Data: Left elbow pain.  LEFT ELBOW - COMPLETE 3+ VIEW  Comparison: None  Findings: The joint spaces are maintained.  No acute fracture or osteochondral lesion.  No joint effusion.  Small bony density near the triceps attachment on the olecranon could be due to calcific tendonitis or a small avulsion.  IMPRESSION:  1.  Distal triceps calcific tendonitis versus small avulsion. 2.  No joint effusion or osteochondral abnormality.   Original Report Authenticated By: P.  Gallerani, M.D.      1. Left elbow pain       MDM  Sling, ice, ibuprofen  F/u with dr. Stana Bunting, PA 08/11/12 [phone]

## 2012-08-12 NOTE — ED Provider Notes (Signed)
Medical screening examination/treatment/procedure(s) were performed by non-physician practitioner and as supervising physician I was immediately available for consultation/collaboration.   Jady Braggs III, MD 08/12/12 1009 

## 2013-02-12 ENCOUNTER — Encounter: Payer: Self-pay | Admitting: Medical

## 2013-02-12 ENCOUNTER — Ambulatory Visit (INDEPENDENT_AMBULATORY_CARE_PROVIDER_SITE_OTHER): Payer: PRIVATE HEALTH INSURANCE | Admitting: Medical

## 2013-02-12 VITALS — BP 100/60 | HR 92 | Temp 98.0°F | Wt 212.0 lb

## 2013-02-12 DIAGNOSIS — E669 Obesity, unspecified: Secondary | ICD-10-CM

## 2013-02-12 DIAGNOSIS — F17200 Nicotine dependence, unspecified, uncomplicated: Secondary | ICD-10-CM

## 2013-02-12 DIAGNOSIS — R0602 Shortness of breath: Secondary | ICD-10-CM

## 2013-02-12 DIAGNOSIS — R609 Edema, unspecified: Secondary | ICD-10-CM

## 2013-02-12 LAB — BASIC METABOLIC PANEL
BUN: 7 mg/dL (ref 6–23)
CO2: 23 mEq/L (ref 19–32)
Calcium: 9.5 mg/dL (ref 8.4–10.5)
Chloride: 108 mEq/L (ref 96–112)
Creat: 0.57 mg/dL (ref 0.50–1.10)
Glucose, Bld: 93 mg/dL (ref 70–99)
Potassium: 4.2 mEq/L (ref 3.5–5.3)
Sodium: 139 mEq/L (ref 135–145)

## 2013-02-12 LAB — CBC WITH DIFFERENTIAL/PLATELET
Basophils Absolute: 0.1 10*3/uL (ref 0.0–0.1)
Basophils Relative: 1 % (ref 0–1)
Eosinophils Absolute: 0.2 10*3/uL (ref 0.0–0.7)
Eosinophils Relative: 2 % (ref 0–5)
HCT: 39.2 % (ref 36.0–46.0)
Hemoglobin: 13.1 g/dL (ref 12.0–15.0)
Lymphocytes Relative: 35 % (ref 12–46)
Lymphs Abs: 4.1 10*3/uL — ABNORMAL HIGH (ref 0.7–4.0)
MCH: 28 pg (ref 26.0–34.0)
MCHC: 33.4 g/dL (ref 30.0–36.0)
MCV: 83.8 fL (ref 78.0–100.0)
Monocytes Absolute: 0.5 10*3/uL (ref 0.1–1.0)
Monocytes Relative: 5 % (ref 3–12)
Neutro Abs: 6.8 10*3/uL (ref 1.7–7.7)
Neutrophils Relative %: 57 % (ref 43–77)
Platelets: 345 10*3/uL (ref 150–400)
RBC: 4.68 MIL/uL (ref 3.87–5.11)
RDW: 15.3 % (ref 11.5–15.5)
WBC: 11.7 10*3/uL — ABNORMAL HIGH (ref 4.0–10.5)

## 2013-02-12 LAB — TSH: TSH: 0.828 u[IU]/mL (ref 0.350–4.500)

## 2013-02-12 MED ORDER — TRAMADOL HCL 50 MG PO TABS: 50.0000 mg | ORAL_TABLET | Freq: Three times a day (TID) | ORAL | Status: DC | PRN

## 2013-02-12 MED ORDER — HYDROCHLOROTHIAZIDE 12.5 MG PO TABS: 12.5000 mg | ORAL_TABLET | Freq: Every day | ORAL | Status: DC

## 2013-02-12 NOTE — Progress Notes (Signed)
Subjective:  Virginia Douglas is a 37 y.o. female who presents as a new patient today.  accompanied by husband today.  She notes hx/o swelling in both legs and ankles, but lately more painful than anything.   Aleve and tylenol don't seem to help.  She walks a lot on the job.  Denise leg trauma, no recent surgery, no chest pain, no excessive salt intake.  She does note hx/o edema and iron defiency anemia.  She does note some SOB, fatigue, exercise intolerance to some extent.  No specific DOE, no palpitations.  She is a smoker.  No hx/o asthma or lung disease.  No weight chagres,no fever, no rash, no GI or GU c/o.  No other aggravating or relieving factors.   No other c/o.  The following portions of the patient's history were reviewed and updated as appropriate: allergies, current medications, past family history, past medical history, past social history, past surgical history and problem list.  No Known Allergies  No current outpatient prescriptions on file prior to visit.   No current facility-administered medications on file prior to visit.    Past Medical History  Diagnosis Date  . Edema   . Migraine headache   . Anemia     iron deficiency    Past Surgical History  Procedure Laterality Date  . Abdominal hysterectomy    . Cholecystectomy      Family History  Problem Relation Age of Onset  . Diabetes Mother   . Hypertension Mother   . Diabetes Father   . Hypertension Father   . Hypertension Brother     History   Social History  . Marital Status: Divorced    Spouse Name: N/A    Number of Children: N/A  . Years of Education: N/A   Occupational History  . Not on file.   Social History Main Topics  . Smoking status: Current Every Day Smoker -- 1.00 packs/day    Types: Cigarettes  . Smokeless tobacco: Not on file  . Alcohol Use: No  . Drug Use: No  . Sexually Active: Yes    Birth Control/ Protection: Surgical   Other Topics Concern  . 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   ROS Otherwise as in subjective above  Objective: Physical Exam  Vital signs reviewed  General appearance: alert, no distress, WD/WN, AA female HEENT: normocephalic, sclerae anicteric, conjunctiva pink and moist, TMs pearly, nares patent, no discharge or erythema, pharynx normal Oral cavity: MMM, no lesions Neck: supple, no lymphadenopathy, no thyromegaly, no masses Heart: RRR, normal S1, S2, no murmurs Lungs: CTA bilaterally, no wheezes, rhonchi, or rales Abdomen: +bs, soft, non tender, non distended, no masses, no hepatomegaly, no splenomegaly Pulses: 2+ radial pulses, 2+ pedal pulses, normal cap refill Ext: 1+ slight LE bilat edema in lower legs and ankles, nonpitting  CXR: possible slight cardiomegaly, but otherwise no acute changes, no mass, no pneumonia, no pneumothorax.  Will send xray for over read.  Assessment: Encounter Diagnoses  Name Primary?  . Edema Yes  . SOB (shortness of breath)   . Tobacco use disorder   . Obesity, unspecified      Plan: Advised leg elevation, routine exercise with walking, OTC compression hose, salt avoidance.  Advised that diuretics  would likely not change any thing, but she wants to try this since she felt like she had improvement with this in the past.  Advised of risks/benefits.   Begin HCTZ low dose. Labs and CXR today.  Likely etiology of edema is venous insufficiency, mild/dependent edema.   SOB etiology unclear - deconditioning, tobacco use, other.   Follow up: pending labs.

## 2013-02-12 NOTE — Patient Instructions (Addendum)
For dependent edema or swelling in the leg:  consider OTC compression hose, wearing these daily  Avoid added salt in the diet (condiments, soda, eating out)  Exercise regularly in general  Leg elevation in the evening  On days with excessive swelling, try the fluid pill  Use Aleve for pain, but for worse pain, you can use Ultram  Peripheral Edema You have swelling in your legs (peripheral edema). This swelling is due to excess accumulation of salt and water in your body. Edema may be a sign of heart, kidney or liver disease, or a side effect of a medication. It may also be due to problems in the leg veins. Elevating your legs and using special support stockings may be very helpful, if the cause of the swelling is due to poor venous circulation. Avoid long periods of standing, whatever the cause. Treatment of edema depends on identifying the cause. Chips, pretzels, pickles and other salty foods should be avoided. Restricting salt in your diet is almost always needed. Water pills (diuretics) are often used to remove the excess salt and water from your body via urine. These medicines prevent the kidney from reabsorbing sodium. This increases urine flow. Diuretic treatment may also result in lowering of potassium levels in your body. Potassium supplements may be needed if you have to use diuretics daily. Daily weights can help you keep track of your progress in clearing your edema. You should call your caregiver for follow up care as recommended. SEEK IMMEDIATE MEDICAL CARE IF:   You have increased swelling, pain, redness, or heat in your legs.  You develop shortness of breath, especially when lying down.  You develop chest or abdominal pain, weakness, or fainting.  You have a fever. Document Released: 09/28/2004 Document Revised: 11/13/2011 Document Reviewed: 09/08/2009 [facility] Patient Information 2014 [place], [place].

## 2013-02-26 ENCOUNTER — Encounter: Payer: Self-pay | Admitting: Medical

## 2013-02-28 ENCOUNTER — Encounter: Payer: Self-pay | Admitting: Family Medicine

## 2013-03-11 ENCOUNTER — Emergency Department (HOSPITAL_COMMUNITY)
Admission: EM | Admit: 2013-03-11 | Discharge: 2013-03-11 | Disposition: A | Discharge status: Home / Self Care | Payer: PRIVATE HEALTH INSURANCE | Attending: Emergency Medicine | Admitting: Emergency Medicine

## 2013-03-11 ENCOUNTER — Encounter (HOSPITAL_COMMUNITY): Payer: Self-pay | Admitting: Emergency Medicine

## 2013-03-11 ENCOUNTER — Emergency Department (HOSPITAL_COMMUNITY): Payer: PRIVATE HEALTH INSURANCE

## 2013-03-11 DIAGNOSIS — Z872 Personal history of diseases of the skin and subcutaneous tissue: Secondary | ICD-10-CM | POA: Insufficient documentation

## 2013-03-11 DIAGNOSIS — R5600 Simple febrile convulsions: Secondary | ICD-10-CM | POA: Insufficient documentation

## 2013-03-11 DIAGNOSIS — F17200 Nicotine dependence, unspecified, uncomplicated: Secondary | ICD-10-CM | POA: Insufficient documentation

## 2013-03-11 DIAGNOSIS — Z79899 Other long term (current) drug therapy: Secondary | ICD-10-CM | POA: Insufficient documentation

## 2013-03-11 DIAGNOSIS — M549 Dorsalgia, unspecified: Secondary | ICD-10-CM | POA: Insufficient documentation

## 2013-03-11 DIAGNOSIS — R55 Syncope and collapse: Secondary | ICD-10-CM | POA: Insufficient documentation

## 2013-03-11 DIAGNOSIS — Z8679 Personal history of other diseases of the circulatory system: Secondary | ICD-10-CM | POA: Insufficient documentation

## 2013-03-11 DIAGNOSIS — Z862 Personal history of diseases of the blood and blood-forming organs and certain disorders involving the immune mechanism: Secondary | ICD-10-CM | POA: Insufficient documentation

## 2013-03-11 LAB — CBC WITH DIFFERENTIAL/PLATELET
Basophils Absolute: 0.1 10*3/uL (ref 0.0–0.1)
Basophils Relative: 0 % (ref 0–1)
Eosinophils Absolute: 0.1 10*3/uL (ref 0.0–0.7)
Eosinophils Relative: 1 % (ref 0–5)
HCT: 38.7 % (ref 36.0–46.0)
Hemoglobin: 13.6 g/dL (ref 12.0–15.0)
Lymphocytes Relative: 34 % (ref 12–46)
Lymphs Abs: 4.3 10*3/uL — ABNORMAL HIGH (ref 0.7–4.0)
MCH: 29.6 pg (ref 26.0–34.0)
MCHC: 35.1 g/dL (ref 30.0–36.0)
MCV: 84.1 fL (ref 78.0–100.0)
Monocytes Absolute: 0.7 10*3/uL (ref 0.1–1.0)
Monocytes Relative: 5 % (ref 3–12)
Neutro Abs: 7.6 10*3/uL (ref 1.7–7.7)
Neutrophils Relative %: 60 % (ref 43–77)
Platelets: 281 10*3/uL (ref 150–400)
RBC: 4.6 MIL/uL (ref 3.87–5.11)
RDW: 14.4 % (ref 11.5–15.5)
WBC: 12.7 10*3/uL — ABNORMAL HIGH (ref 4.0–10.5)

## 2013-03-11 LAB — BASIC METABOLIC PANEL
BUN: 10 mg/dL (ref 6–23)
CO2: 23 mEq/L (ref 19–32)
Calcium: 10.1 mg/dL (ref 8.4–10.5)
Chloride: 101 mEq/L (ref 96–112)
Creatinine, Ser: 0.67 mg/dL (ref 0.50–1.10)
GFR calc Af Amer: >90 mL/min (ref >90)
GFR calc non Af Amer: >90 mL/min (ref >90)
Glucose, Bld: 98 mg/dL (ref 70–99)
Potassium: 3.4 mEq/L — ABNORMAL LOW (ref 3.5–5.1)
Sodium: 137 mEq/L (ref 135–145)

## 2013-03-11 LAB — CK: Total CK: 153 U/L (ref 7–177)

## 2013-03-11 IMAGING — CT CT HEAD W/O CM
1 series · 16 of 30 positions shown, 20 images · non-contrast
Comparison: None.

CLINICAL DATA: Headache, fever, seizure.

CT HEAD WITHOUT CONTRAST
TECHNIQUE: Contiguous axial images were obtained from the base of
the skull through the vertex without contrast.

[Series 2: headtrauma 4.8 h37s · axial · 0.50mm/px · z∈[+138,+303]mm · 16 of 36 slices shown, 20 images]
[im 2/36  brain]
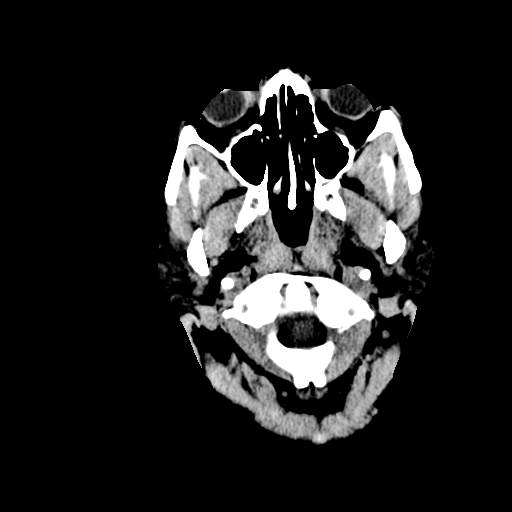
[im 2/36  bone]
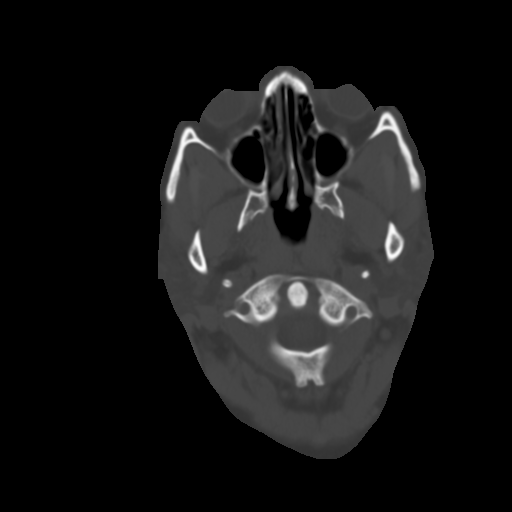
[im 4/36  brain]
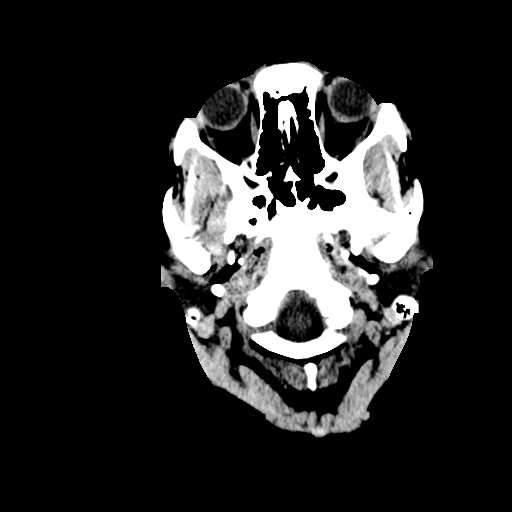
[im 7/36  brain]
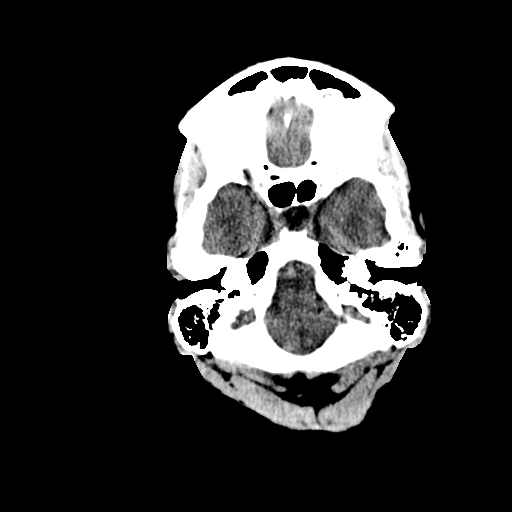
[im 9/36  brain]
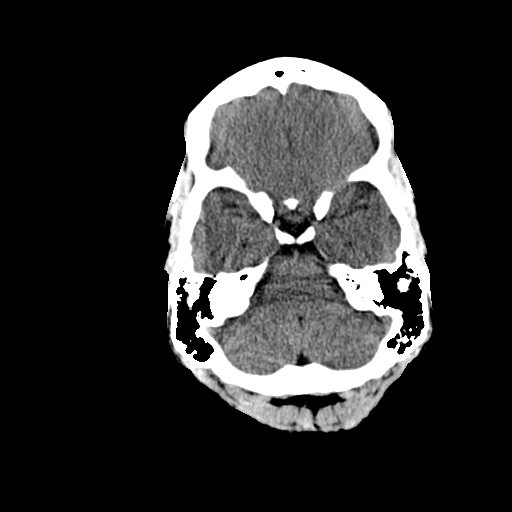
[im 10/36  brain]
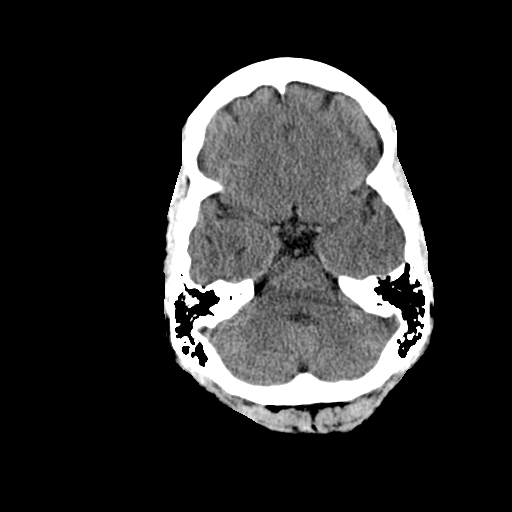
[im 10/36  bone]
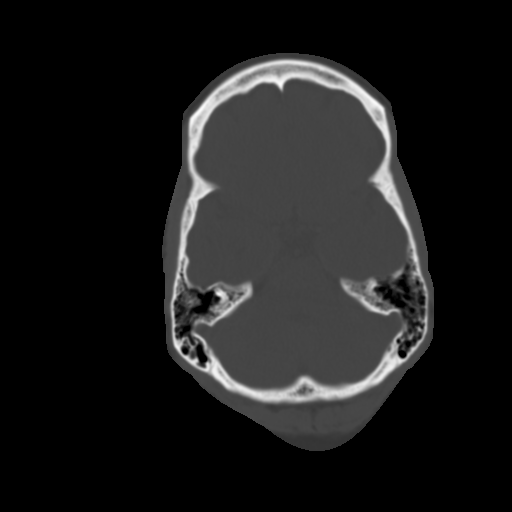
[im 13/36  brain]
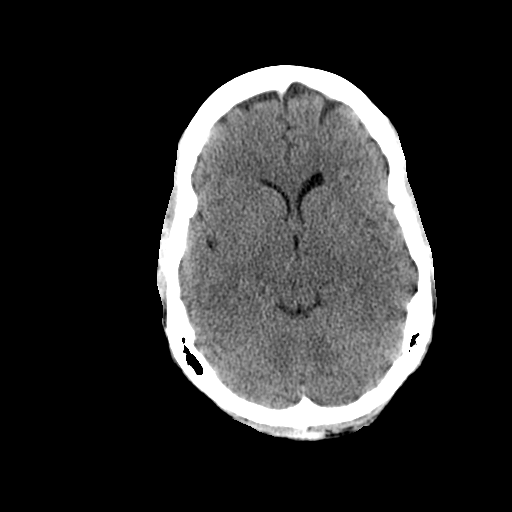
[im 15/36  brain]
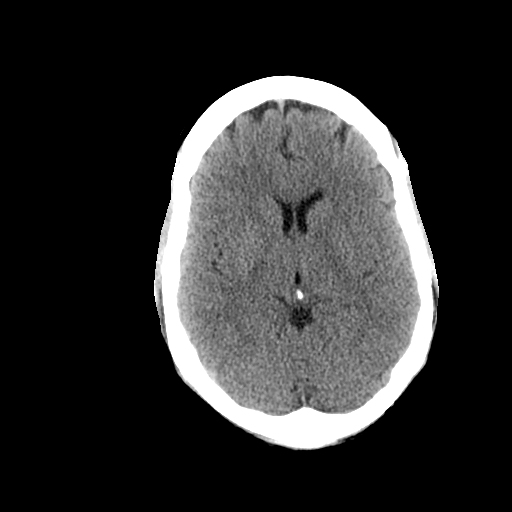
[im 17/36  brain]
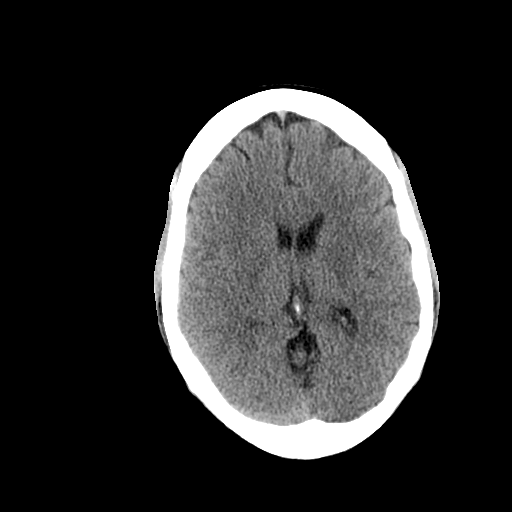
[im 19/36  brain]
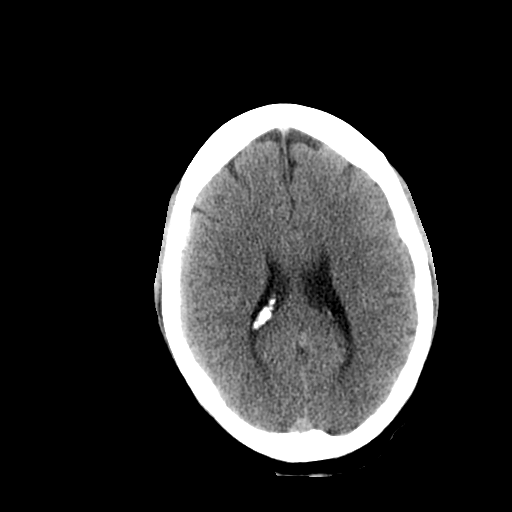
[im 19/36  bone]
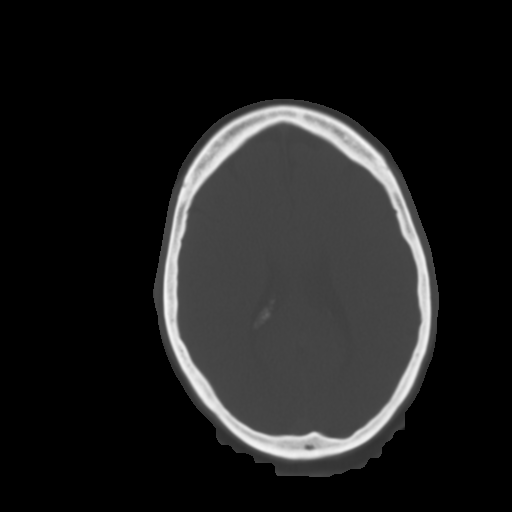
[im 21/36  brain]
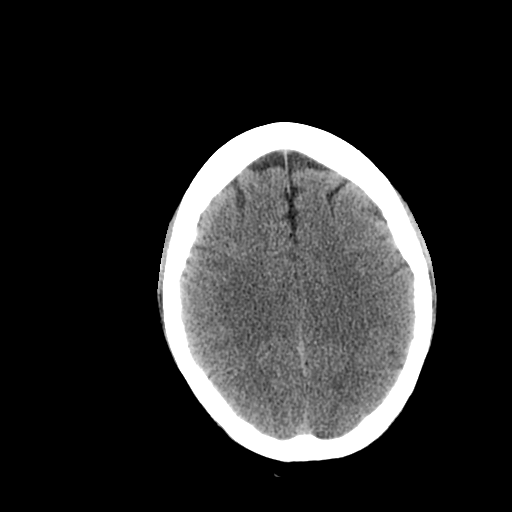
[im 23/36  brain]
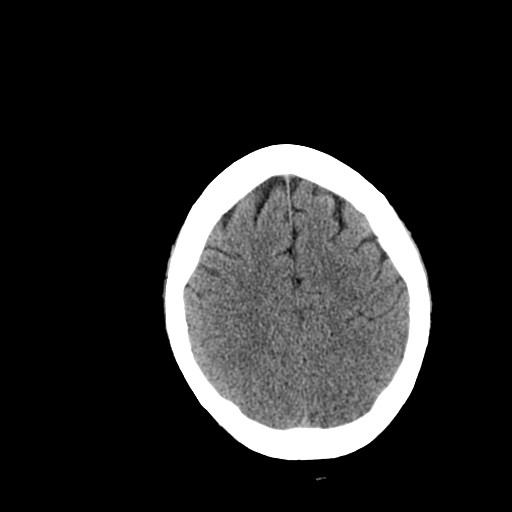
[im 26/36  brain]
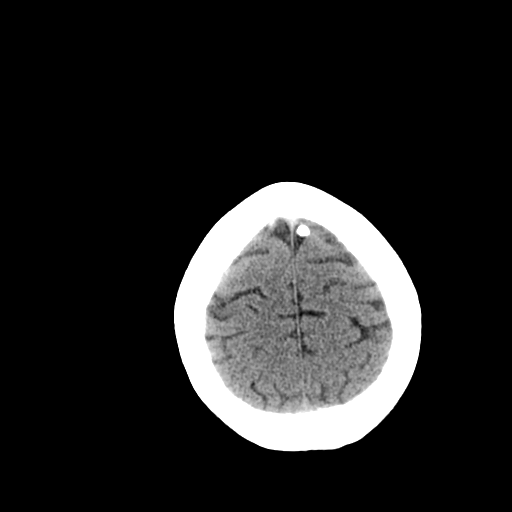
[im 27/36  brain]
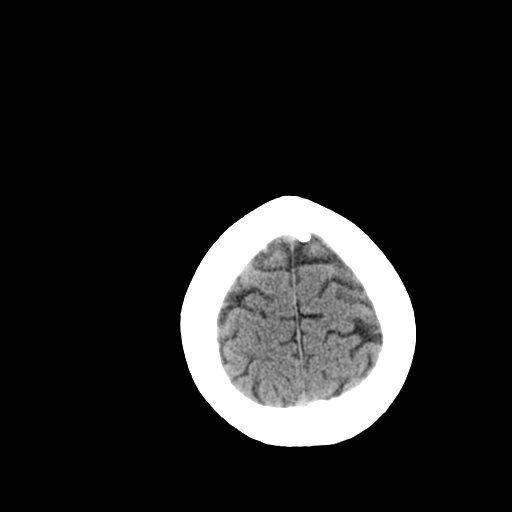
[im 27/36  bone]
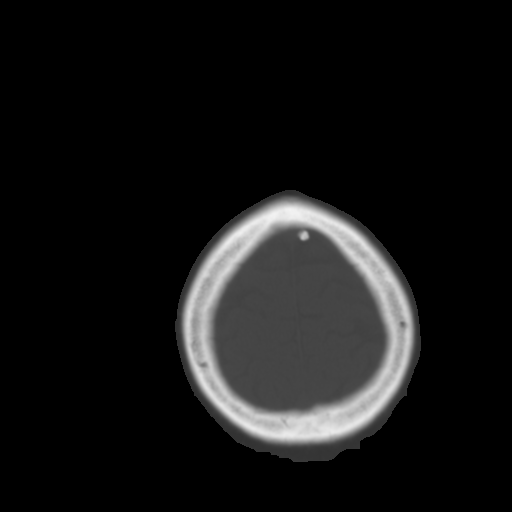
[im 29/36  brain]
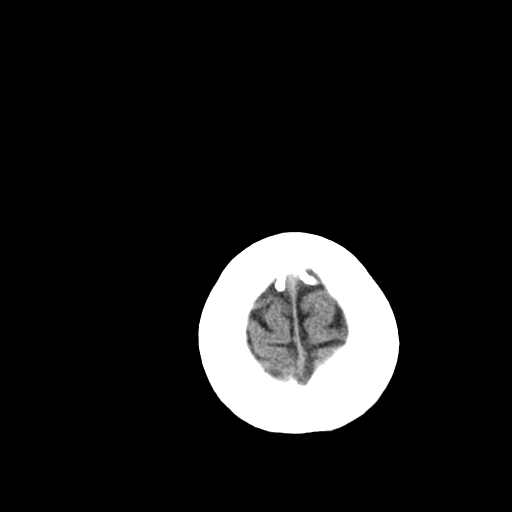
[im 32/36  brain]
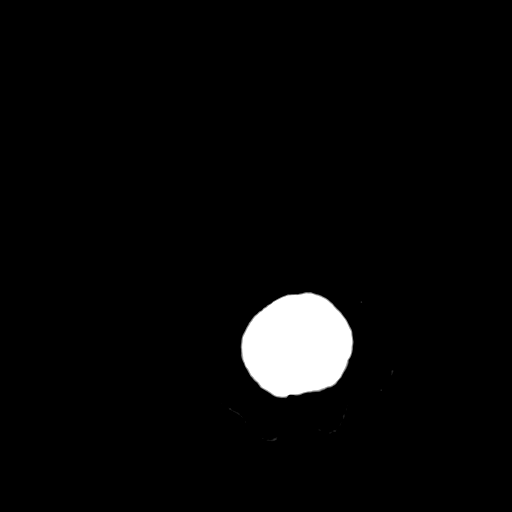
[im 34/36  brain]
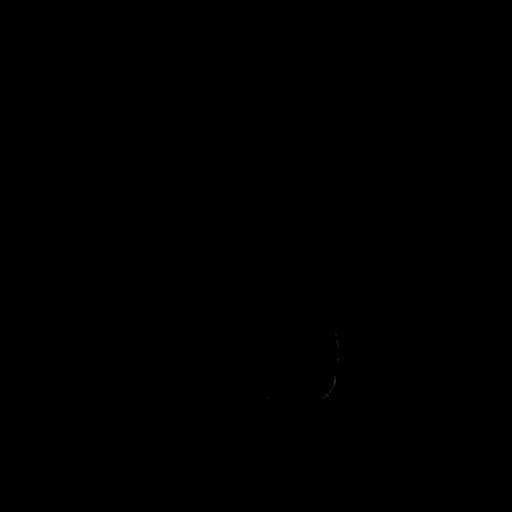

[16 of 30 positions shown; findings below may reference images not displayed]

FINDINGS: No acute intracranial abnormality.  Specifically, no
hemorrhage, hydrocephalus, mass lesion, acute infarction, or
significant intracranial injury.  No acute calvarial abnormality.
Visualized paranasal sinuses and mastoids clear.  Orbital soft
tissues unremarkable.
IMPRESSION: No acute intracranial abnormality.

## 2013-03-11 MED ORDER — HYDROCODONE-ACETAMINOPHEN 5-325 MG PO TABS: 1.0000 | ORAL_TABLET | Freq: Four times a day (QID) | ORAL | Status: DC | PRN

## 2013-03-11 MED ORDER — HYDROMORPHONE HCL PF 1 MG/ML IJ SOLN
1.0000 mg | Freq: Once | INTRAMUSCULAR | Status: AC
  Administered 2013-03-11: 1 mg via INTRAVENOUS
  Filled 2013-03-11: qty 1

## 2013-03-11 MED ORDER — ONDANSETRON 4 MG PO TBDP
4.0000 mg | ORAL_TABLET | Freq: Once | ORAL | Status: AC
  Administered 2013-03-11: 4 mg via ORAL
  Filled 2013-03-11 (×2): qty 1

## 2013-03-11 MED ORDER — SODIUM CHLORIDE 0.9 % IV SOLN
INTRAVENOUS | Status: DC
  Administered 2013-03-11: 19:00:00 via INTRAVENOUS

## 2013-03-11 MED ORDER — SODIUM CHLORIDE 0.9 % IV BOLUS (SEPSIS)
1000.0000 mL | Freq: Once | INTRAVENOUS | Status: AC
  Administered 2013-03-11: 1000 mL via INTRAVENOUS

## 2013-03-11 MED ORDER — GI COCKTAIL ~~LOC~~
30.0000 mL | Freq: Once | ORAL | Status: AC
  Administered 2013-03-11: 30 mL via ORAL
  Filled 2013-03-11: qty 30

## 2013-03-11 MED ORDER — PANTOPRAZOLE SODIUM 20 MG PO TBEC: 20.0000 mg | DELAYED_RELEASE_TABLET | Freq: Every day | ORAL | Status: DC

## 2013-03-11 NOTE — ED Provider Notes (Addendum)
History  This chart was scribed for Benny Lennert, MD, by Candelaria Stagers, ED Scribe. This patient was seen in room APA09/APA09 and the patient's care was started at 7:41 PM  CSN: [id number] Arrival date & time 03/11/13  1827  First MD Initiated Contact with Patient 03/11/13 1939     Chief Complaint  Patient presents with  . Headache  . Febrile Seizure    The history is provided by the patient and medical records. No language interpreter was used.   HPI Comments: Virginia Douglas is a 37 y.o. female who presents to the Emergency Department complaining of a headache that started earlier today while at work.  Pt reports an episode of syncope.  A fellow employee reports she became hot and experienced syncope.  After 911 called she was moved to the air condition where she began to convulse.  Pt denies h/o seizures.  She has h/o headaches.  She denies biting her tongue or loss of bladder control.  She reports it took her several minutes to regain consiousness.  She is also experiencing back pain.  Pt reports she was prescribed tramadol one month ago for leg pain and reports taking a 50mg  tablet around noon today.    PCP Tysinger       Past Medical History  Diagnosis Date  . Edema   . Migraine headache   . Anemia     iron deficiency   Past Surgical History  Procedure Laterality Date  . Abdominal hysterectomy    . Cholecystectomy     Family History  Problem Relation Age of Onset  . Diabetes Mother   . Hypertension Mother   . Diabetes Father   . Hypertension Father   . Hypertension Brother    History  Substance Use Topics  . Smoking status: Current Every Day Smoker -- 1.00 packs/day    Types: Cigarettes  . Smokeless tobacco: Not on file  . Alcohol Use: No   OB History   Grav Para Term Preterm Abortions TAB SAB Ect Mult Living                 Review of Systems  Neurological: Positive for syncope and headaches.  All other systems reviewed and are  negative.    Allergies  Review of patient's allergies indicates no known allergies.  Home Medications   Current Outpatient Rx  Name  Route  Sig  Dispense  Refill  . hydrochlorothiazide (HYDRODIURIL) 12.5 MG tablet   Oral   Take 1 tablet (12.5 mg total) by mouth daily.   30 tablet   3   . traMADol (ULTRAM) 50 MG tablet   Oral   Take 1 tablet (50 mg total) by mouth every 8 (eight) hours as needed for pain.   30 tablet   0    BP 107/63  Pulse 76  Temp(Src) 99.2 F (37.3 C) (Rectal)  Resp 18  SpO2 100% Physical Exam  Nursing note and vitals reviewed. Constitutional: She is oriented to person, place, and time. She appears well-developed and well-nourished.  HENT:  Head: Normocephalic and atraumatic.  Eyes: EOM are normal.  Neck: Neck supple. No tracheal deviation present.  Cardiovascular: Normal rate.   Pulmonary/Chest: Effort normal. No respiratory distress.  Musculoskeletal: Normal range of motion.  Neurological: She is alert and oriented to person, place, and time.  Skin: Skin is warm and dry.  Psychiatric: She has a normal mood and affect. Her behavior is normal.    ED Course  Procedures   DIAGNOSTIC STUDIES: Oxygen Saturation is 100% on room air, normal by my interpretation.    COORDINATION OF CARE:  7:46 PM Discussed course of care with pt. Pt understands and agrees.   10:05 PM Discussed images and lab results with pt.  Pt reports her headache has improved.    Labs Reviewed  CBC WITH DIFFERENTIAL - Abnormal; Notable for the following:    WBC 12.7 (*)    Lymphs Abs 4.3 (*)    All other components within normal limits  BASIC METABOLIC PANEL - Abnormal; Notable for the following:    Potassium 3.4 (*)    All other components within normal limits  CK   Ct Head Wo Contrast  03/11/2013   *RADIOLOGY REPORT*  Clinical Data: Headache, fever, seizure.  CT HEAD WITHOUT CONTRAST  Technique:  Contiguous axial images were obtained from the base of the skull  through the vertex without contrast.  Comparison: None.  Findings: No acute intracranial abnormality.  Specifically, no hemorrhage, hydrocephalus, mass lesion, acute infarction, or significant intracranial injury.  No acute calvarial abnormality. Visualized paranasal sinuses and mastoids clear.  Orbital soft tissues unremarkable.  IMPRESSION: No acute intracranial abnormality.   Original Report Authenticated By: Charlett Nose, M.D.   No diagnosis found.  Date: 03/11/2013  Rate: 93  Rhythm: normal sinus rhythm  QRS Axis: normal  Intervals: normal  ST/T Wave abnormalities: normal  Conduction Disutrbances:none  Narrative Interpretation:   Old EKG Reviewed: none available   MDM  Syncope.  With normal cpk.  Most likely not a seizure The chart was scribed for me under my direct supervision.  I personally performed the history, physical, and medical decision making and all procedures in the evaluation of this patient.Benny Lennert, MD 03/11/13 7829  Benny Lennert, MD 03/11/13 2212

## 2013-03-11 NOTE — ED Notes (Signed)
Pt c/o headache at work and got real hot. ems called and fire dpt witnessed a grand mal seizure approx 2 min. No hx of seizure. Pt arrived alert/oreinted and  No s/s of being post ictal. ems states pt was post ictal upon their arrival. Pt still c/o h/a now rating 10. Pt had been at work since [employer]. Fingertips numb. Mm moist.

## 2013-03-11 NOTE — ED Notes (Signed)
Family at bedside.Patient asking for a warm blanket. Also still complaining that her head is hurting.

## 2013-03-17 ENCOUNTER — Ambulatory Visit (INDEPENDENT_AMBULATORY_CARE_PROVIDER_SITE_OTHER): Payer: PRIVATE HEALTH INSURANCE | Admitting: Medical

## 2013-03-17 ENCOUNTER — Encounter: Payer: Self-pay | Admitting: Medical

## 2013-03-17 VITALS — BP 102/70 | HR 76 | Temp 98.2°F | Resp 16 | Wt 219.0 lb

## 2013-03-17 DIAGNOSIS — R609 Edema, unspecified: Secondary | ICD-10-CM

## 2013-03-17 DIAGNOSIS — G43909 Migraine, unspecified, not intractable, without status migrainosus: Secondary | ICD-10-CM

## 2013-03-17 DIAGNOSIS — F430 Acute stress reaction: Secondary | ICD-10-CM

## 2013-03-17 DIAGNOSIS — R55 Syncope and collapse: Secondary | ICD-10-CM

## 2013-03-17 MED ORDER — POTASSIUM CHLORIDE CRYS ER 10 MEQ PO TBCR: 10.0000 meq | EXTENDED_RELEASE_TABLET | Freq: Every day | ORAL | Status: DC

## 2013-03-17 MED ORDER — CITALOPRAM HYDROBROMIDE 20 MG PO TABS: ORAL_TABLET | ORAL | Status: DC

## 2013-03-17 NOTE — Progress Notes (Signed)
Subjective: Here for hospital f/u and recheck.   I last saw her as a new patient for c/o leg swelling.  She began HCTZ 12.5mg , compression hose, elevation, and legs are much better.   Her main concern today is stress.  She ended up begin taken to the hospital recently after an episode of syncope and possible febrile seizure.   She is a Careers adviser at work, has subordinates, and lately there have been a lot of interpersonal issues that she has had trouble dealing with.  In addition, her work in environment in the [employer] is 10-15 degrees hotter than outside, and lately outside temperatures have been 85-90 degrees.   The other day she feels like she was breathing fast and hard and ended up passing out.  Others say she was convulsing so 911 was called.  She does note episodes in the past with hyperventilation.   She has also been having more migraines of late.  Feels like she is carrying lots of stress, needs medication to help with her nerves.   Never been on medication for this, but at work there are often times she feels anxious and would like something to calm her down.  Also seems to be the sounding board for her famil's problems.  At times has trouble dealing with other people's problems.   Objective:   Physical Exam  Filed Vitals:   03/17/13 0949  BP: 102/70  Pulse: 76  Temp: 98.2 F (36.8 C)  Resp: 16    General appearance: alert, no distress, WD/WN  HEENT: normocephalic, sclerae anicteric, PERRLA, EOMi, nares patent, no discharge or erythema, pharynx normal Oral cavity: MMM, no lesions Neck: supple, no lymphadenopathy, no thyromegaly, no masses Heart: RRR, normal S1, S2, no murmurs Lungs: CTA bilaterally, no wheezes, rhonchi, or rales Extremities: no edema, no cyanosis, no clubbing Pulses: 2+ symmetric, upper and lower extremities, normal cap refill Neurological: alert, oriented x 3, CN2-12 intact, strength normal upper extremities and lower extremities, sensation normal  throughout, DTRs 2+ throughout, no cerebellar signs, gait normal Psychiatric: normal affect, behavior normal, pleasant    Assessment and Plan :     Encounter Diagnoses  Name Primary?  . Acute stress reaction Yes  . Migraine   . Edema   . Syncope    discussed her concerns.  reviewed ED report from recent visit.   Begin citalopram for mood, anxiety, discussed risks/benefits of medication.   discussed stress reduction, considering reading more about management styles, dealing with interpersonal issues in the office.  consider counseling.  Hopefully if she can get her stress issues under control, the headaches will improve . Advised good hydration at work throughout the day since she is in a hotter than normal environment.  Advised steps to take in the event of hyperventilation.   Edema is controlled with leg elevation, compression hose, and low dose HCTZ.  Begin daily potassium.  Recent syncope episodes was thought to be febrile seizures, but after further review of the history, hyperventilation could have been possible as well.  F/u 2-3 wk.

## 2013-03-24 ENCOUNTER — Other Ambulatory Visit: Payer: Self-pay | Admitting: Medical

## 2013-03-24 ENCOUNTER — Telehealth: Payer: Self-pay | Admitting: Medical

## 2013-03-24 MED ORDER — CYCLOBENZAPRINE HCL 10 MG PO TABS: ORAL_TABLET | ORAL | Status: DC

## 2013-03-24 NOTE — Telephone Encounter (Signed)
Have her try Flexeril muscle relaxer at bedtime, not daily but for the next few nights to see if this helps with spasms.  Caution - this will make her sleepy so don't take in the day

## 2013-03-24 NOTE — Telephone Encounter (Signed)
Pt called and stated that her leg issue is worse today. She states that she has been having leg spasms all day. She states the pain medication is not helping. She is requesting something stronger be called in. Pt uses [facility] in [place]. Pt can be reached before 6 pm at [phone] after 6 on her cell [phone].

## 2013-03-24 NOTE — Telephone Encounter (Signed)
Patient states that she is not swelling. She said her leg is cramping really bad along with back and leg spasms. She said she has a Hx/o scoliosis and sciatic nerve issues. She has not taken the HCTZ in about 4 days. She is taking the potassium and drinking her water daily. CLS

## 2013-03-24 NOTE — Telephone Encounter (Signed)
What are her main symptoms, swelling, cramps?  What does she mean by spasms?  Is she taking still 12.5mg  Hydrochlorothiazide?   Is she taking the potassium once daily?  How much water is she drinking daily?

## 2013-03-24 NOTE — Telephone Encounter (Signed)
LMOM TO CB. CLS 

## 2013-03-25 NOTE — Telephone Encounter (Signed)
LMOM TO CB. CLS 

## 2013-03-25 NOTE — Telephone Encounter (Signed)
Patient is aware of what David Tysinger PA-C recommended. CLS 

## 2013-04-01 ENCOUNTER — Ambulatory Visit: Payer: PRIVATE HEALTH INSURANCE | Admitting: Medical

## 2013-04-30 ENCOUNTER — Encounter: Payer: Self-pay | Admitting: Medical

## 2013-04-30 ENCOUNTER — Ambulatory Visit (INDEPENDENT_AMBULATORY_CARE_PROVIDER_SITE_OTHER): Payer: PRIVATE HEALTH INSURANCE | Admitting: Medical

## 2013-04-30 VITALS — BP 112/70 | HR 94 | Temp 97.8°F | Resp 16 | Wt 224.0 lb

## 2013-04-30 DIAGNOSIS — K219 Gastro-esophageal reflux disease without esophagitis: Secondary | ICD-10-CM

## 2013-04-30 DIAGNOSIS — F430 Acute stress reaction: Secondary | ICD-10-CM

## 2013-04-30 DIAGNOSIS — M7061 Trochanteric bursitis, right hip: Secondary | ICD-10-CM

## 2013-04-30 DIAGNOSIS — M549 Dorsalgia, unspecified: Secondary | ICD-10-CM

## 2013-04-30 DIAGNOSIS — M76899 Other specified enthesopathies of unspecified lower limb, excluding foot: Secondary | ICD-10-CM

## 2013-04-30 MED ORDER — CYCLOBENZAPRINE HCL 10 MG PO TABS: ORAL_TABLET | ORAL | Status: DC

## 2013-04-30 MED ORDER — NAPROXEN 375 MG PO TABS: 375.0000 mg | ORAL_TABLET | Freq: Two times a day (BID) | ORAL | Status: DC

## 2013-04-30 MED ORDER — PANTOPRAZOLE SODIUM 20 MG PO TBEC: 20.0000 mg | DELAYED_RELEASE_TABLET | Freq: Every day | ORAL | Status: DC

## 2013-04-30 NOTE — Patient Instructions (Signed)
Begin Naprosyn twice daily for pain and inflammation x a week, then use as needed.  Use muscle relaxer Flexeril at bedtime for spasms in the back.  Only use on worse days.  This will cause sleepiness.  Massage Therapy:  [facility] [address] [place], [place] [address] [phone] [email or web address] Back Pain, Adult Low back pain is very common. About 1 in 5 people have back pain.The cause of low back pain is rarely dangerous. The pain often gets better over time.About half of people with a sudden onset of back pain feel better in just 2 weeks. About 8 in 10 people feel better by 6 weeks.  CAUSES Some common causes of back pain include:  Strain of the muscles or ligaments supporting the spine.  Wear and tear (degeneration) of the spinal discs.  Arthritis.  Direct injury to the back. DIAGNOSIS Most of the time, the direct cause of low back pain is not known.However, back pain can be treated effectively even when the exact cause of the pain is unknown.Answering your caregiver's questions about your overall health and symptoms is one of the most accurate ways to make sure the cause of your pain is not dangerous. If your caregiver needs more information, he or she may order lab work or imaging tests (X-rays or MRIs).However, even if imaging tests show changes in your back, this usually does not require surgery. HOME CARE INSTRUCTIONS For many people, back pain returns.Since low back pain is rarely dangerous, it is often a condition that people can learn to [facility] their own.   Remain active. It is stressful on the back to sit or stand in one place. Do not sit, drive, or stand in one place for more than 30 minutes at a time. Take short walks on level surfaces as soon as pain allows.Try to increase the length of time you walk each day.  Do not stay in bed.Resting more than 1 or 2 days can delay your recovery.  Do not avoid exercise or  work.Your body is made to move.It is not dangerous to be active, even though your back may hurt.Your back will likely heal faster if you return to being active before your pain is gone.  Pay attention to your body when you bend and lift. Many people have less discomfortwhen lifting if they bend their knees, keep the load close to their bodies,and avoid twisting. Often, the most comfortable positions are those that put less stress on your recovering back.  Find a comfortable position to sleep. Use a firm mattress and lie on your side with your knees slightly bent. If you lie on your back, put a pillow under your knees.  Only take over-the-counter or prescription medicines as directed by your caregiver. Over-the-counter medicines to reduce pain and inflammation are often the most helpful.Your caregiver may prescribe muscle relaxant drugs.These medicines help dull your pain so you can more quickly return to your normal activities and healthy exercise.  Put ice on the injured area.  Put ice in a plastic bag.  Place a towel between your skin and the bag.  Leave the ice on for 15-20 minutes, 3-4 times a day for the first 2 to 3 days. After that, ice and heat may be alternated to reduce pain and spasms.  Ask your caregiver about trying back exercises and gentle massage. This may be of some benefit.  Avoid feeling anxious or stressed.Stress increases muscle tension and can worsen back pain.It is important  to recognize when you are anxious or stressed and learn ways to manage it.Exercise is a great option. SEEK MEDICAL CARE IF:  You have pain that is not relieved with rest or medicine.  You have pain that does not improve in 1 week.  You have new symptoms.  You are generally not feeling well. SEEK IMMEDIATE MEDICAL CARE IF:   You have pain that radiates from your back into your legs.  You develop new bowel or bladder control problems.  You have unusual weakness or numbness in your  arms or legs.  You develop nausea or vomiting.  You develop abdominal pain.  You feel faint. Document Released: 08/21/2005 Document Revised: 02/20/2012 Document Reviewed: 01/09/2011 [facility] Patient Information 2014 [place], [place].
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
## 2013-05-07 ENCOUNTER — Ambulatory Visit: Payer: PRIVATE HEALTH INSURANCE | Admitting: Medical

## 2013-06-19 ENCOUNTER — Ambulatory Visit: Payer: PRIVATE HEALTH INSURANCE | Admitting: Family Medicine

## 2013-07-11 ENCOUNTER — Institutional Professional Consult (permissible substitution): Payer: PRIVATE HEALTH INSURANCE | Admitting: Medical

## 2013-07-18 ENCOUNTER — Encounter: Payer: Self-pay | Admitting: Medical

## 2013-07-18 ENCOUNTER — Ambulatory Visit (INDEPENDENT_AMBULATORY_CARE_PROVIDER_SITE_OTHER): Payer: PRIVATE HEALTH INSURANCE | Admitting: Medical

## 2013-07-18 VITALS — BP 120/78 | HR 100 | Temp 97.5°F | Resp 16 | Wt 222.0 lb

## 2013-07-18 DIAGNOSIS — R7301 Impaired fasting glucose: Secondary | ICD-10-CM

## 2013-07-18 DIAGNOSIS — R21 Rash and other nonspecific skin eruption: Secondary | ICD-10-CM

## 2013-07-18 DIAGNOSIS — E669 Obesity, unspecified: Secondary | ICD-10-CM

## 2013-07-18 DIAGNOSIS — F17200 Nicotine dependence, unspecified, uncomplicated: Secondary | ICD-10-CM

## 2013-07-18 DIAGNOSIS — J329 Chronic sinusitis, unspecified: Secondary | ICD-10-CM

## 2013-07-18 MED ORDER — AMOXICILLIN 875 MG PO TABS: 875.0000 mg | ORAL_TABLET | Freq: Two times a day (BID) | ORAL | Status: DC

## 2013-07-18 MED ORDER — PHENTERMINE-TOPIRAMATE 7.5-46 MG PO CP24
1.0000 | ORAL_CAPSULE | ORAL | Status: DC
Start: 2013-07-18 — End: 2013-08-06

## 2013-07-18 MED ORDER — PHENTERMINE-TOPIRAMATE ER 3.75-23 MG PO CP24: 1.0000 | ORAL_CAPSULE | ORAL | Status: DC

## 2013-07-18 NOTE — Progress Notes (Signed)
Subjective:  Virginia Douglas is a 37 y.o. female who presents with several concerns.  She had a wellness lab panel at work, needs to discuss results and get form singed for insurer.  Labs show elevated glucose/prediabetes, increased health risks given her smoking status and BMI>30.  Since the wellness screen she has already begun diet and exercise changes, has already lost some weight.  She does smoke, not quite ready to quit this.    No other smokers in her household.  She notes about a week hx/o sinus pressure, ear pressure, irritated throat, thinks she has sinus infection.  She notes rash on both ankles chronic intermittent.   Has seen dermatology in remote past.  Not sure what that called it but its not going away.  No other c/o.  The following portions of the patient's history were reviewed and updated as appropriate: allergies, current medications, past family history, past medical history, past social history, past surgical history and problem list.  ROS Otherwise as in subjective above  Objective: Physical Exam  BP 120/78  Pulse 100  Temp(Src) 97.5 F (36.4 C) (Oral)  Resp 16  Wt 222 lb (100.699 kg)   General appearance: alert, no distress, WD/WN Skin: bilat posterolateral feet just over heel with 4-5 small raised papular lesions with central depression, dry, brown, no warmth, fluctuance, or drainage, mildly tender  Heent: tender frontal sinuses, otherwise ENT unremarkable Lungs clear    Assessment: Encounter Diagnoses  Name Primary?  . Obesity, unspecified Yes  . Impaired fasting blood sugar   . Tobacco use disorder   . Rash and nonspecific skin eruption   . Sinusitis      Plan: Obesity - she is already making diet and exercise changes.  discussed medication to help her efforts.  discussed risks/benefits of medication.  Begin Qsymia to help with weight loss given BMI>30, impaired fasting glucose.   F/u 75mo Impaired fasting glucose - recent HgbA1C of 6.3%.   lifestyle changes implemented at this time. Tobacco use - discussed risks of tobacco use.  Encouraged her to consider stopping tobacco.  She is contemplating this. Rash - referral to dermatology Sinusitis - amoxicillin script.  Follow up: 75mo, sooner prn.

## 2013-07-23 ENCOUNTER — Ambulatory Visit (INDEPENDENT_AMBULATORY_CARE_PROVIDER_SITE_OTHER): Payer: PRIVATE HEALTH INSURANCE | Admitting: Medical

## 2013-07-23 ENCOUNTER — Encounter: Payer: Self-pay | Admitting: Medical

## 2013-07-23 VITALS — BP 110/70 | HR 92 | Temp 98.6°F

## 2013-07-23 DIAGNOSIS — R509 Fever, unspecified: Secondary | ICD-10-CM

## 2013-07-23 DIAGNOSIS — R05 Cough: Secondary | ICD-10-CM

## 2013-07-23 DIAGNOSIS — R109 Unspecified abdominal pain: Secondary | ICD-10-CM

## 2013-07-23 DIAGNOSIS — R51 Headache: Secondary | ICD-10-CM

## 2013-07-23 DIAGNOSIS — R059 Cough, unspecified: Secondary | ICD-10-CM

## 2013-07-23 DIAGNOSIS — R112 Nausea with vomiting, unspecified: Secondary | ICD-10-CM

## 2013-07-23 DIAGNOSIS — B379 Candidiasis, unspecified: Secondary | ICD-10-CM

## 2013-07-23 LAB — POCT INFLUENZA A/B
Influenza A, POC: NEGATIVE
Influenza B, POC: NEGATIVE

## 2013-07-23 MED ORDER — PROMETHAZINE HCL 25 MG/ML IJ SOLN
25.0000 mg | Freq: Once | INTRAMUSCULAR | Status: AC
  Administered 2013-07-23: 25 mg via INTRAMUSCULAR

## 2013-07-23 MED ORDER — FLUCONAZOLE 100 MG PO TABS: 100.0000 mg | ORAL_TABLET | Freq: Every day | ORAL | Status: DC

## 2013-07-23 NOTE — Progress Notes (Signed)
  Subjective:  Virginia Douglas is a 37 y.o. female who presents with 3 day hx/o headache, stomach cramps, few episodes of vomiting, felt feverish, nasal congestion, coughing up some light green phlegm, diffuse fatigue and muscles aches, right ear pain.  Denies diarrhea, wheezing, SOB, chest pain.  Son has been sick with similar, his lasted a few days and he got better.  Similarly her mother had similar symptoms recently and got better as well.  She was here 5 days ago for several concerns, one of which was sinus pressure. She did begin amoxicillin, the sinus congestion feels some better but now she has a yeast infection with lots of white thick vaginal discharge.  No other aggravating or relieving factors.    No other c/o.  The following portions of the patient's history were reviewed and updated as appropriate: allergies, current medications, past family history, past medical history, past social history, past surgical history and problem list.  ROS Otherwise as in subjective above  Objective: Physical Exam  BP 110/70  Pulse 92  Temp(Src) 98.6 F (37 C) (Oral)   General appearance: alert, no distress, WD/WN, somewhat ill appearing HEENT: normocephalic, sclerae anicteric, conjunctiva pink and moist, TMs pearly, nares with mild erythema, turbinate edema and clear discharge, pharynx normal Oral cavity: MMM, no lesions Neck: supple, no lymphadenopathy, no thyromegaly, no masses, tender generalized Heart: RRR, normal S1, S2, no murmurs Lungs: CTA bilaterally, no wheezes, rhonchi, or rales Abdomen: +bs, soft, generalized tenderness, non distended, no masses, no hepatomegaly, no splenomegaly Pulses: 2+ radial pulses, 2+ pedal pulses, normal cap refill Ext: no edema MSK.: Seems to be tender mildly in general of muscles   Assessment: Encounter Diagnoses  Name Primary?  . Nausea with vomiting Yes  . Cough   . Headache(784.0)   .  Abdominal cramping   . Fever, unspecified   . Yeast infection     Plan: Symptoms and exam suggest viral illness.  Discussed usual timeframe to resolve.  At this point advise she rest, hydrate well, use Benadryl for congestion and nausea, Excedrin for headache if needed. Stop amoxicillin. Begin Diflucan for yeast infection secondary to antibiotic use.  We gave 25 mg of Phenergan IM in the office.  Follow up: When necessary

## 2013-07-23 NOTE — Addendum Note (Signed)
Addended by: Leretha Dykes L on: 07/23/2013 11:29 AM   Modules accepted: Orders

## 2013-07-29 ENCOUNTER — Telehealth: Payer: Self-pay | Admitting: Medical

## 2013-07-29 NOTE — Telephone Encounter (Signed)
Other options:  Belviq  Contrave  Phentermine generic + Topamax generic

## 2013-07-29 NOTE — Telephone Encounter (Signed)
Called pt to advised of Shane's info

## 2013-08-06 ENCOUNTER — Other Ambulatory Visit: Payer: Self-pay | Admitting: Medical

## 2013-08-06 ENCOUNTER — Telehealth: Payer: Self-pay | Admitting: Medical

## 2013-08-06 MED ORDER — TOPIRAMATE 25 MG PO TABS: 25.0000 mg | ORAL_TABLET | Freq: Every day | ORAL | Status: DC

## 2013-08-06 MED ORDER — PHENTERMINE HCL 37.5 MG PO CAPS: 37.5000 mg | ORAL_CAPSULE | ORAL | Status: DC

## 2013-08-06 NOTE — Telephone Encounter (Signed)
Medication sent, begin, c/t diet and exercise efforts to lose weight .  Recheck in 6-8wk.

## 2013-08-07 ENCOUNTER — Encounter: Payer: Self-pay | Admitting: Medical

## 2013-08-07 NOTE — Telephone Encounter (Signed)
Called pt. Informed that meds called to pharmacy and informed of Shane's other instructions. Pt will call back for f/u appt later

## 2013-08-08 ENCOUNTER — Other Ambulatory Visit: Payer: Self-pay | Admitting: Medical

## 2013-08-08 ENCOUNTER — Telehealth: Payer: Self-pay | Admitting: Medical

## 2013-08-08 MED ORDER — PHENTERMINE HCL 37.5 MG PO CAPS: 37.5000 mg | ORAL_CAPSULE | ORAL | Status: DC

## 2013-08-08 NOTE — Telephone Encounter (Signed)
Shane printed script and I called pt to let her know that script is here for pick up

## 2013-08-18 ENCOUNTER — Other Ambulatory Visit: Payer: Self-pay | Admitting: Physician Assistant

## 2013-09-26 ENCOUNTER — Emergency Department (HOSPITAL_COMMUNITY)
Admission: EM | Admit: 2013-09-26 | Discharge: 2013-09-26 | Disposition: A | Discharge status: Home / Self Care | Payer: PRIVATE HEALTH INSURANCE | Attending: Emergency Medicine | Admitting: Emergency Medicine

## 2013-09-26 ENCOUNTER — Encounter (HOSPITAL_COMMUNITY): Payer: Self-pay | Admitting: Emergency Medicine

## 2013-09-26 ENCOUNTER — Emergency Department (HOSPITAL_COMMUNITY): Payer: PRIVATE HEALTH INSURANCE

## 2013-09-26 DIAGNOSIS — Z79899 Other long term (current) drug therapy: Secondary | ICD-10-CM | POA: Insufficient documentation

## 2013-09-26 DIAGNOSIS — Z862 Personal history of diseases of the blood and blood-forming organs and certain disorders involving the immune mechanism: Secondary | ICD-10-CM | POA: Insufficient documentation

## 2013-09-26 DIAGNOSIS — G43909 Migraine, unspecified, not intractable, without status migrainosus: Secondary | ICD-10-CM | POA: Insufficient documentation

## 2013-09-26 DIAGNOSIS — Z791 Long term (current) use of non-steroidal anti-inflammatories (NSAID): Secondary | ICD-10-CM | POA: Insufficient documentation

## 2013-09-26 DIAGNOSIS — J069 Acute upper respiratory infection, unspecified: Secondary | ICD-10-CM

## 2013-09-26 DIAGNOSIS — F17200 Nicotine dependence, unspecified, uncomplicated: Secondary | ICD-10-CM | POA: Insufficient documentation

## 2013-09-26 IMAGING — CR DG CHEST 2V
2 series · 2 of 2 positions shown · non-contrast
Comparison: PA and lateral chest [DATE] and [DATE].

CLINICAL DATA: Cough and congestion.  Smoker.

EXAM:
CHEST  2 VIEW

[view not recorded (1 of 2)]
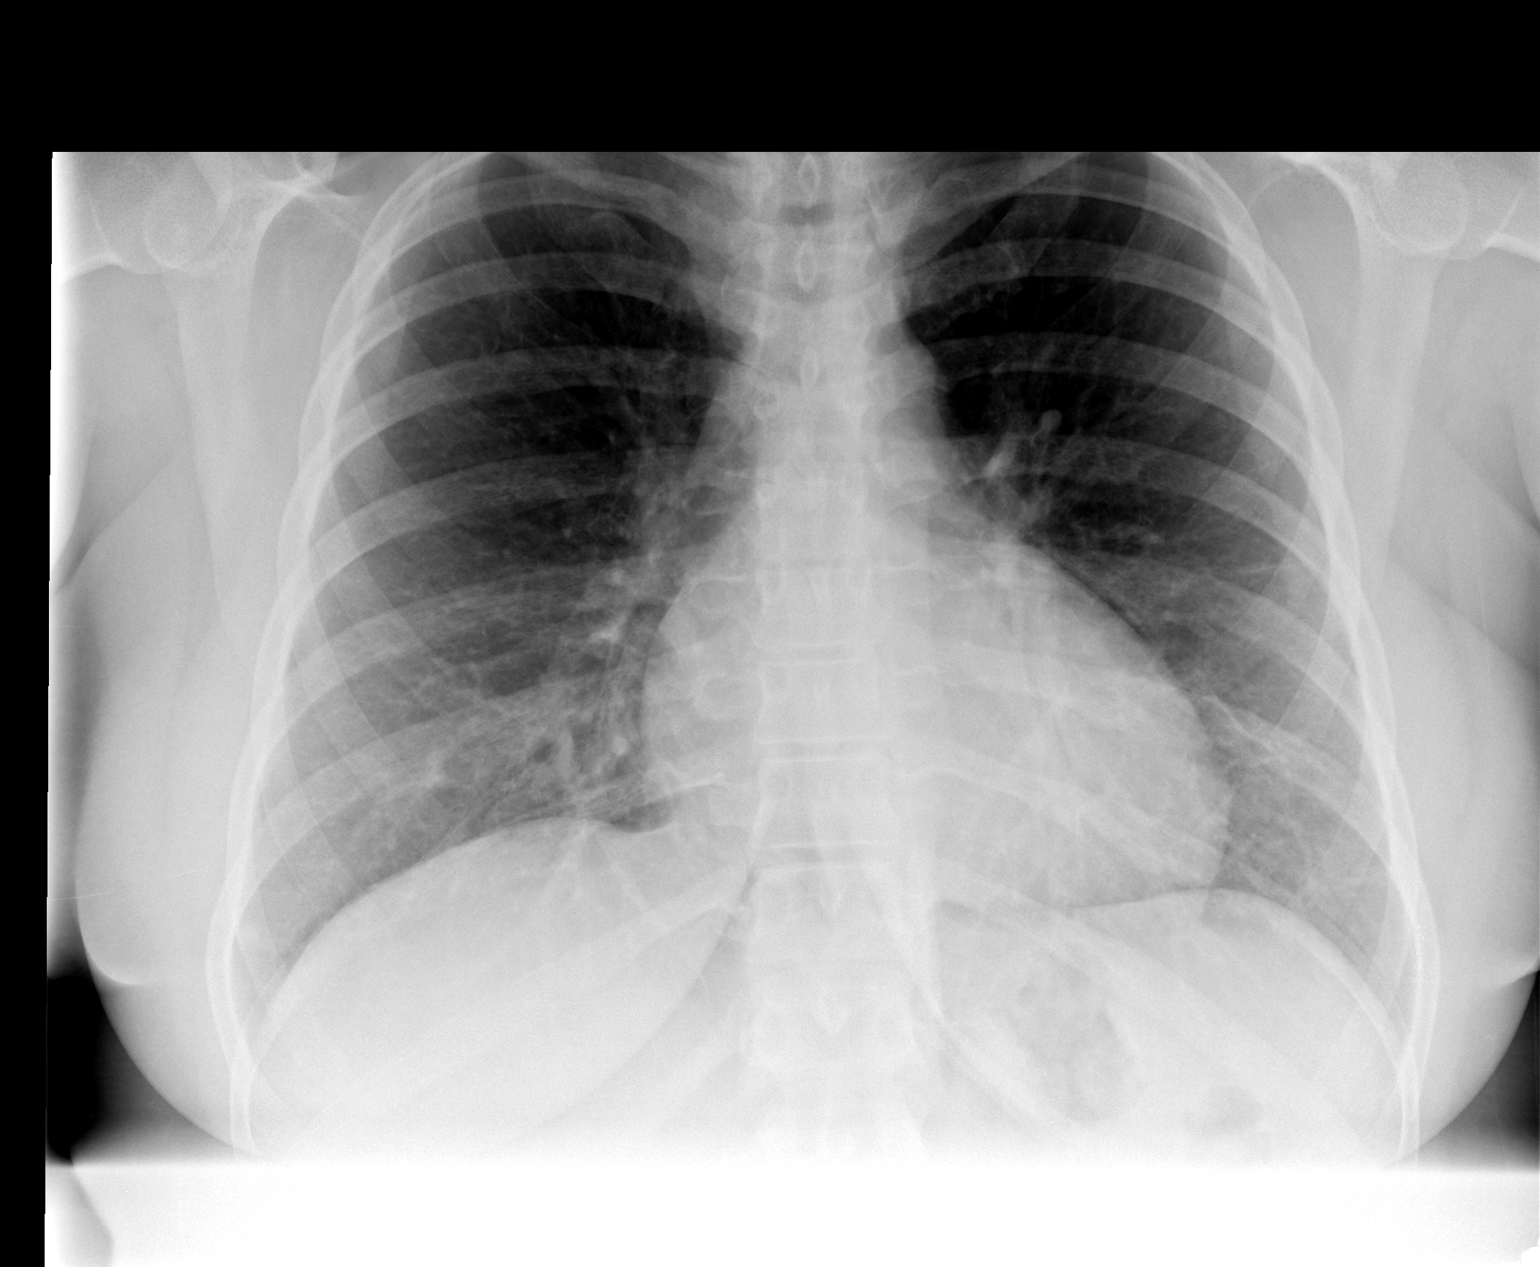

[view not recorded (2 of 2)]
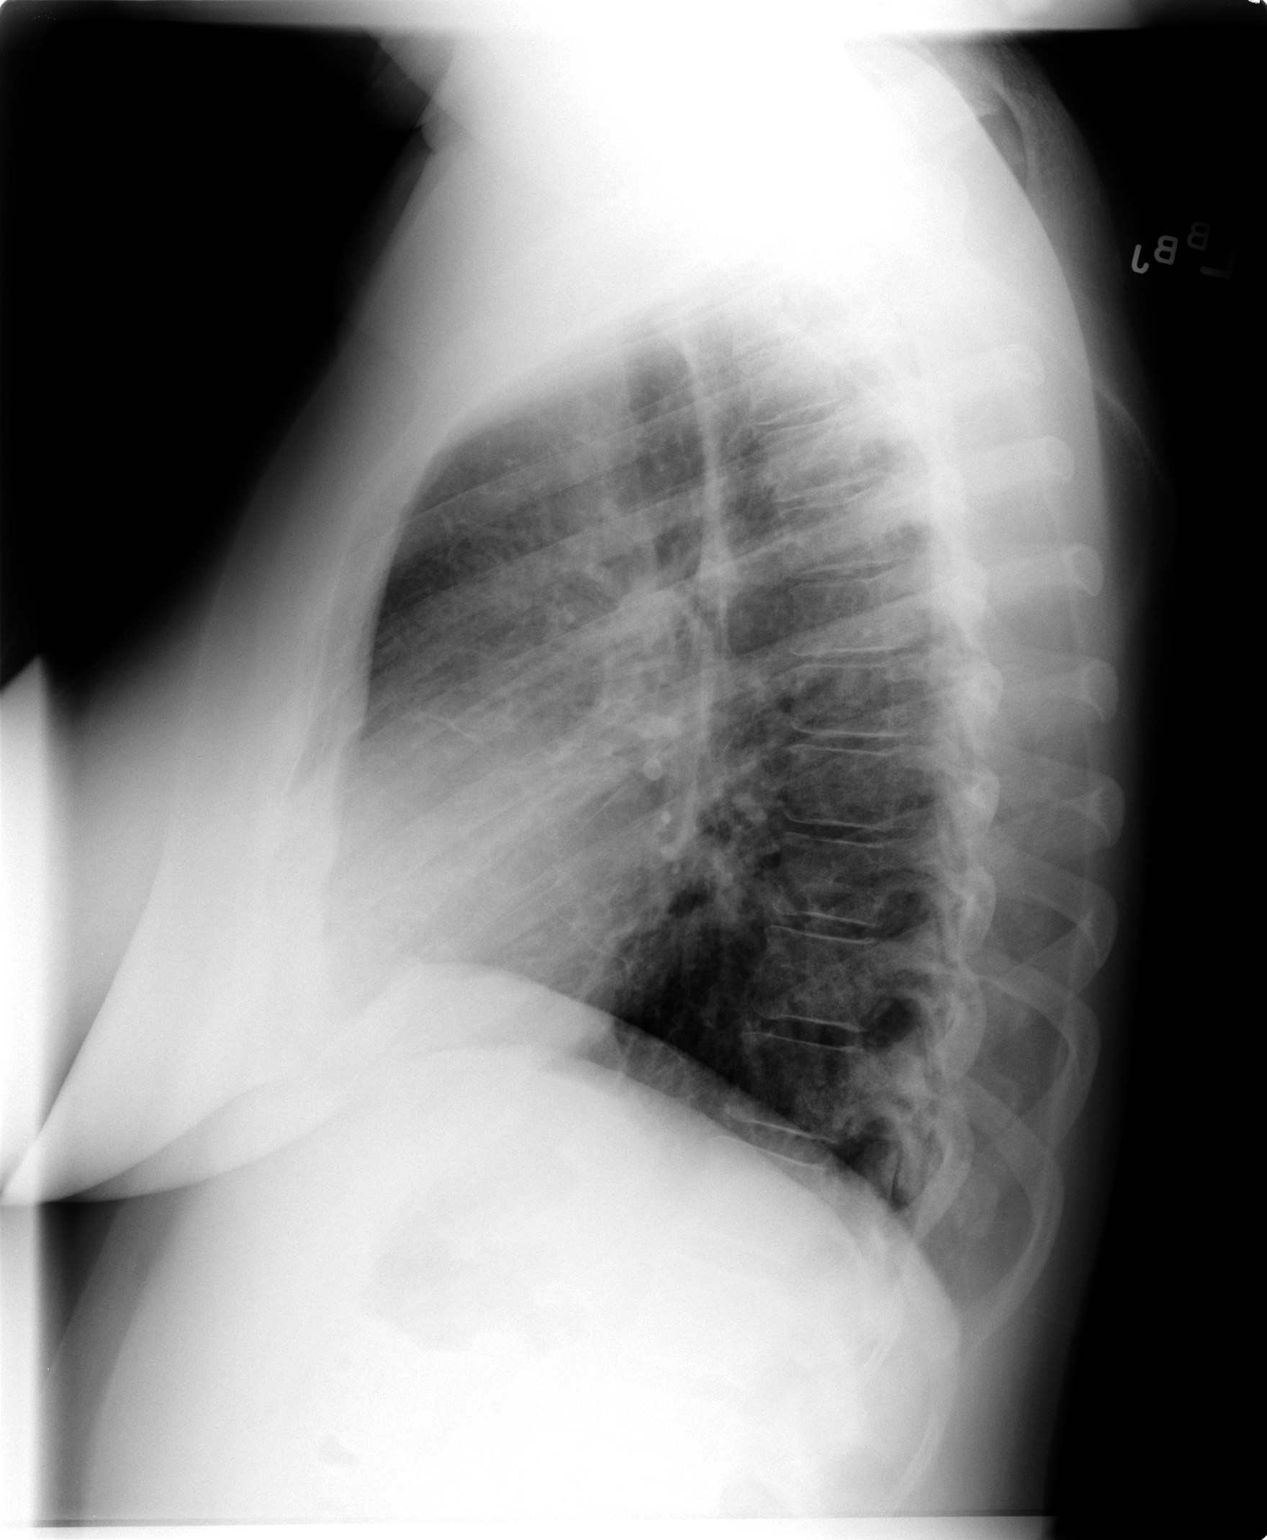

[2 of 2 positions shown; findings below may reference images not displayed]

FINDINGS: Peribronchial thickening is identified. There is no consolidative
process, pneumothorax or effusion. Heart size is upper normal. No
focal bony abnormality is identified.
IMPRESSION: Bronchitic change without focal process.

## 2013-09-26 MED ORDER — BENZONATATE 100 MG PO CAPS: 100.0000 mg | ORAL_CAPSULE | Freq: Three times a day (TID) | ORAL | Status: DC | PRN

## 2013-09-26 MED ORDER — ALBUTEROL SULFATE HFA 108 (90 BASE) MCG/ACT IN AERS: 2.0000 | INHALATION_SPRAY | RESPIRATORY_TRACT | Status: DC | PRN

## 2013-09-26 NOTE — ED Provider Notes (Signed)
CSN: [id number]     Arrival date & time 09/26/13  0617 History   First MD Initiated Contact with Patient 09/26/13 [phone]     Chief Complaint  Patient presents with  . URI   HPI Pt was seen at [place].  Per pt, c/o gradual onset and persistence of constant chills, generalized body aches/fatigue, runny/stuffy nose, sinus congestion, and cough for the past 2-3 days.  Denies fevers, no sore throat, no rash, no CP/SOB, no N/V/D, no abd pain.     Past Medical History  Diagnosis Date  . Edema   . Migraine headache   . Anemia     iron deficiency   Past Surgical History  Procedure Laterality Date  . Abdominal hysterectomy    . Cholecystectomy     Family History  Problem Relation Age of Onset  . Diabetes Mother   . Hypertension Mother   . Diabetes Father   . Hypertension Father   . Hypertension Brother   . Cancer Maternal Aunt     breast  . Diabetes Maternal Grandmother   . Diabetes Paternal Grandmother    History  Substance Use Topics  . Smoking status: Current Every Day Smoker -- 1.00 packs/day    Types: Cigarettes  . Smokeless tobacco: Not on file  . Alcohol Use: No    Review of Systems ROS: Statement: All systems negative except as marked or noted in the HPI; Constitutional: Negative for fever and chills. +generalized body aches/fatigue.; ; Eyes: Negative for eye pain, redness and discharge. ; ; ENMT: Negative for ear pain, hoarseness, sore throat. +nasal congestion, sinus pressure. ; ; Cardiovascular: Negative for chest pain, palpitations, diaphoresis, dyspnea and peripheral edema. ; ; Respiratory: +cough. Negative for wheezing and stridor. ; ; Gastrointestinal: Negative for nausea, vomiting, diarrhea, abdominal pain, blood in stool, hematemesis, jaundice and rectal bleeding. . ; ; Genitourinary: Negative for dysuria, flank pain and hematuria. ; ; Musculoskeletal: Negative for back pain and neck pain. Negative for swelling and trauma.; ; Skin: Negative for pruritus, rash, abrasions,  blisters, bruising and skin lesion.; ; Neuro: Negative for headache, lightheadedness and neck stiffness. Negative for weakness, altered level of consciousness , altered mental status, extremity weakness, paresthesias, involuntary movement, seizure and syncope.       Allergies  Citalopram  Home Medications   Current Outpatient Rx  Name  Route  Sig  Dispense  Refill  . albuterol (PROVENTIL HFA;VENTOLIN HFA) 108 (90 BASE) MCG/ACT inhaler   Inhalation   Inhale 2 puffs into the lungs every 4 (four) hours as needed for wheezing or shortness of breath.   1 Inhaler   0   . benzonatate (TESSALON) 100 MG capsule   Oral   Take 1 capsule (100 mg total) by mouth 3 (three) times daily as needed for cough.   15 capsule   0   . cyclobenzaprine (FLEXERIL) 10 MG tablet      1/2-1 tablet po QHS prn   20 tablet   0   . fluconazole (DIFLUCAN) 100 MG tablet   Oral   Take 1 tablet (100 mg total) by mouth daily.   7 tablet   0   . hydrochlorothiazide (HYDRODIURIL) 12.5 MG tablet   Oral   Take 1 tablet (12.5 mg total) by mouth daily.   30 tablet   3   . naproxen (NAPROSYN) 375 MG tablet   Oral   Take 1 tablet (375 mg total) by mouth 2 (two) times daily with a meal.  30 tablet   0   . pantoprazole (PROTONIX) 20 MG tablet   Oral   Take 1 tablet (20 mg total) by mouth daily.   30 tablet   2   . phentermine 37.5 MG capsule   Oral   Take 1 capsule (37.5 mg total) by mouth every morning.   30 capsule   1   . potassium chloride (K-DUR,KLOR-CON) 10 MEQ tablet   Oral   Take 1 tablet (10 mEq total) by mouth daily.   30 tablet   1   . topiramate (TOPAMAX) 25 MG tablet   Oral   Take 1 tablet (25 mg total) by mouth daily.   30 tablet   1    BP 119/89  Pulse 78  Temp(Src) 97.9 F (36.6 C)  Resp 20  Ht 5\' 7"  (1.702 m)  Wt 218 lb (98.884 kg)  BMI 34.14 kg/m2  SpO2 97% Physical Exam 0720: Physical examination:  Nursing notes reviewed; Vital signs and O2 SAT reviewed;   Constitutional: Well developed, Well nourished, Well hydrated, In no acute distress; Head:  Normocephalic, atraumatic; Eyes: EOMI, PERRL, No scleral icterus; ENMT: TM's clear bilat. +edemetous nasal turbinates bilat with clear rhinorrhea. Mouth and pharynx without lesions. No tonsillar exudates. No intra-oral edema. No submandibular or sublingual edema. No hoarse voice, no drooling, no stridor. No pain with manipulation of larynx. Mouth and pharynx normal, Mucous membranes moist; Neck: Supple, Full range of motion, No lymphadenopathy. No meningeal signs.; Cardiovascular: Regular rate and rhythm, No murmur, rub, or gallop; Respiratory: Breath sounds clear & equal bilaterally, No rales, rhonchi, wheezes.  Speaking full sentences with ease, Normal respiratory effort/excursion; Chest: Nontender, Movement normal; Abdomen: Soft, Nontender, Nondistended, Normal bowel sounds; Genitourinary: No CVA tenderness; Extremities: Pulses normal, No tenderness, No edema, No calf edema or asymmetry.; Neuro: AA&Ox3, Major CN grossly intact.  Speech clear. No gross focal motor or sensory deficits in extremities. Climbs on and off stretcher easily by herself. Gait steady.; Skin: Color normal, Warm, Dry.   ED Course  Procedures    EKG Interpretation   None       MDM  MDM Reviewed: previous chart, nursing note and vitals Interpretation: x-ray     Dg Chest 2 View 09/26/2013   CLINICAL DATA:  Cough and congestion.  Smoker.  EXAM: CHEST  2 VIEW  COMPARISON:  PA and lateral chest 11/18/2012 and 08/06/2011.  FINDINGS: Peribronchial thickening is identified. There is no consolidative process, pneumothorax or effusion. Heart size is upper normal. No focal bony abnormality is identified.  IMPRESSION: Bronchitic change without focal process.   Electronically Signed   By: Inge Rise M.D.   On: 09/26/2013 07:11    0725:  No pneumonia on CXR. Will tx symptomatically at this time. Dx and testing d/w pt.  Questions answered.   Verb understanding, agreeable to d/c home with outpt f/u.   Alfonzo Feller, DO 09/28/13 1209

## 2013-09-26 NOTE — Discharge Instructions (Signed)
°Emergency Department Resource Guide °1) Find a Doctor and Pay Out of Pocket °Although you won't have to find out who is covered by your insurance plan, it is a good idea to ask around and get recommendations. You will then need to call the office and see if the doctor you have chosen will accept you as a new patient and what types of options they offer for patients who are self-pay. Some doctors offer discounts or will set up payment plans for their patients who do not have insurance, but you will need to ask so you aren't surprised when you get to your appointment. ° °2) Contact Your Local Health Department °Not all health departments have doctors that can see patients for sick visits, but many do, so it is worth a call to see if yours does. If you don't know where your local health department is, you can check in your phone book. The CDC also has a tool to help you locate your state's health department, and many state websites also have listings of all of their local health departments. ° °3) Find a Walk-in Clinic °If your illness is not likely to be very severe or complicated, you may want to try a walk in clinic. These are popping up all over the country in pharmacies, drugstores, and shopping centers. They're usually staffed by nurse practitioners or physician assistants that have been trained to treat common illnesses and complaints. They're usually fairly quick and inexpensive. However, if you have serious medical issues or chronic medical problems, these are probably not your best option. ° °No Primary Care Doctor: °- Call [facility] at  832-8000 - they can help you locate a primary care doctor that  accepts your insurance, provides certain services, etc. °- Physician Referral Service- 1-800-533-3463 ° °Chronic Pain Problems: °Organization         Address  Phone   Notes  °[facility]  (336) 297-2271 Patients need to be referred by their primary care doctor.  ° °Medication  Assistance: °Organization         Address  Phone   Notes  °[place] [facility] [address] °[place], [place] [address] (336) 641-8030 --Must be a resident of [place] °-- Must have NO insurance coverage whatsoever (no Medicaid/ Medicare, etc.) °-- The pt. MUST have a primary care doctor that directs their care regularly and follows them in the community °  °[facility]  (866) 331-1348   °[employer]  (888) 892-1162   ° °Agencies that provide inexpensive medical care: °Organization         Address  Phone   Notes  °[facility]  (336) 832-8035   °[facility]    (336) 832-7272   °[facility] [address] °[place],  [address] (336) 832-4777   °[facility] [address], °[place] (336) 271-4999   °[facility]    (336) 373-0678   °[facility]    (336) 272-1050   °[facility] ° [address], [place] Phone:  (336) 832-4444, Fax:  (336) 832-4440 Hours of Operation:  9 am - 6 pm, M-F.  Also accepts Medicaid/Medicare and self-pay.  °[facility] ° [address], [place] Phone: (336) 832-3150, Fax: (336) 832-3151. Hours of Operation:  8:30 am - 5:30 pm, M-F.  Also accepts Medicaid and self-pay.  °[facility] [place] [address], [place] Phone: (336) 878-6027   °[facility] [address], [place], [place] (336)723-1848, Ext. 123 Mondays & Thursdays: 7-9 AM.  First 15 patients are seen on a first come, first serve basis. °  ° °Medicaid-accepting [place] Providers: ° °Organization         Address  Phone   Notes  °[facility] [address], [place] (336) 641-2100 Also accepts self-pay patients.  °[facility] [address], [place] ° (336) 856-9996   °[facility] [address], [place]  (336) 288-8857   °[facility] [address] [place] [address], [place] (336) 299-7000   °Veita Bland [address], [place]  ° (336) 373-1557 Only accepts [place] Access Medicaid patients after they have their name applied to their card.  ° °Self-Pay (no insurance) in [place]: ° °Organization         Address  Phone   Notes  °Sickle Cell Patients, [facility] [address], [place] (336) 832-1970   °[facility] [address], [place] (336) 832-4400   °[facility] [place] ° [address], [place] (336) 992-4800   °[facility]/Dr. Osei-Bonsu ° [address] [place] [address], [place] or [address], [place] (336) [phone] Phone number for both [place] and [place] locations is the same.  °[facility] [address], [place] (336) 299-0000   °[facility] [address] [place] [address], [place] or [address] (336) 852-7530 °(336) 878-2260   °[facility] [address], [place] (336) 350-1642, phone; (336) 294-5005, fax Sees patients 1st and 3rd Saturday of every month.  Must not qualify for public or private insurance (i.e. Medicaid, Medicare, [place] Health Choice, Veterans' Benefits) • Household income should be no more than 200% of the poverty level •The clinic cannot treat you if you are pregnant or think you are pregnant • Sexually transmitted diseases are not treated at the clinic.  ° ° °Dental Care: °Organization         Address  Phone  Notes  °[place] [facility] [address], [place] (336) 641-6152 Accepts children up to age 21 who are enrolled in Medicaid or [place] Health Choice; pregnant women with a Medicaid card; and children who have applied for Medicaid or [place] Health Choice, but were declined, whose parents can pay a reduced fee at time of service.  °[place]  [facility] [place]  [address], [place] (336) 641-7733 Accepts children up to age 21 who are enrolled in Medicaid or [place] Health Choice; pregnant women with a Medicaid card; and children who have applied for Medicaid or [place] Health Choice, but were declined, whose parents can pay a reduced fee at time of service.  °[facility] ° [address], [place] (336) 641-4533 Patients are seen by appointment only. Walk-ins are not accepted. [facility] will see patients 18 years of age and older. °Monday - Tuesday (8am-5pm) °Most Wednesdays (8:30-5pm) °$30 per visit, cash only  °[facility] ° [address], [place] (336) 641-4533 Patients are seen by appointment only. Walk-ins are not accepted. [facility] will see patients 18 years of age and older. °One   Wednesday Evening (Monthly: Volunteer Based).  $30 per visit, cash only  °[facility]  (919) 537-3737 for adults; Children under age 4, call Graduate Pediatric Dentistry at ([phone]) [phone]. Children aged 4-14, please call ([phone]) [phone] to request a pediatric application. ° Dental services are provided in all areas of dental care including fillings, crowns and bridges, complete and partial dentures, implants, gum treatment, root canals, and extractions. Preventive care is also provided. Treatment is provided to both adults and children. °Patients are selected via a lottery and there is often a waiting list. °  °[facility] [address], °[place] ° (336) 763-8833 [email or web address] °  °[facility] [address], [place], [place] (336)723-1848, Ext. 123 Second and Fourth Thursday of each month, opens at 6:30 AM; Clinic ends at 9 AM.  Patients are seen on a first-come first-served basis, and a limited number are seen during each clinic.  ° °[facility] ° [address], [place], [place] (336) 723-7904    Eligibility Requirements °You must have lived in [place], [place], or [place] counties for at least the last three months. °  You cannot be eligible for state or federal sponsored healthcare insurance, including Veterans Administration, Medicaid, or Medicare. °  You generally cannot be eligible for healthcare insurance through your employer.  °  How to apply: °Eligibility screenings are held every Tuesday and Wednesday afternoon from 1:00 pm until 4:00 pm. You do not need an appointment for the interview!  °[facility] [address], [place], [place] 336-631-2330   °[facility]  336-342-8273   °[facility]  336-703-3100   °[facility]  336-570-6415   ° °Behavioral Health Resources in the Community: °Intensive Outpatient Programs °Organization         Address  Phone  Notes  °[facility] [address], [place], [place] 336-878-6098   °[facility] [address], [place], [place] 336-832-9800   °[facility] [address], [place], [place] ° 336-882-2125   °[facility] [address],  °[place], [place] 1-800-853-5163 or 336-641-4981   °Substance Abuse Resources °Organization         Address  Phone  Notes  °[facility]  336-882-2125   °[facility]  336-784-9470   °[facility]  336-285-9073   °[facility]  336-845-3988   °Residential & Outpatient Substance Abuse Program  1-800-659-3381   °Psychological Services °Organization         Address  Phone  Notes  °[facility]  336- 832-9600   °[facility]  336- 378-7881   °[facility] [address], [place] 1-800-853-5163 or 336-641-4981   ° °Mobile Crisis Teams °Organization         Address  Phone  Notes  °[facility], Mobile Crisis Care Unit  1-877-626-1772   °Assertive °[facility] ° [address]  [place], [place] 336-834-9664   °Sharon DeEsch [address] °[place] 336-554-5454   ° °Self-Help/Support Groups °Organization         Address  Phone             Notes  °[facility] [place] - variety of support groups  336- 373-1402 Call for more information  °Narcotics Anonymous (NA), [facility] [address], °[place]  2 meetings at this location  ° °  Residential Treatment Programs Organization         Address  Phone  Notes  [facility] [address],    [place]  1-(705)528-7715   [facility]  [address], [place] [address], [place], [place]   [place], [place] 204-042-4528 Admissions: 8am-3pm M-F  [facility] [address],    [place], [place] 502-774-1287   [facility] [address] [place], [place], [place]   [facility] [address]  [place], [place]   [facility] - Intensive Outpatient [address], [place], [place]   [facility] [place]  [place], [place] 1-518-414-0473 or (513)591-1782   [facility] [address] [place], [place] Accepts Medicaid  [facility] [address],  [place] 1-870-170-3522 Substance Abuse/Addiction Treatment   [facility]         Address  Phone  Notes  [facility]  785-579-1392   Domenic Schwab, PhD [address] [place], [place]   202-181-0001 or 224-812-1502   [facility] [place], [place] [phone]   [facility] [address], [place], [place] (415)392-1998 Insurance/Medicaid/sponsorship through [facility] [address] [place]                                    [place], [place] 779-434-1282 [place] [address], [place] (805)[phone]    Dr. Adele Schilder  [phone]   [facility] Dept. 1) [address], [place] 2) [place] 3)  [address], [place] [phone]  (563)[phone]   [place] (608) 235-3016 or [phone] (After Hours)      Take the prescriptions as directed.  Take over the counter tylenol and ibuprofen, as directed on packaging, as needed for discomfort. Take over the counter decongestant (such as sudafed), as directed on packaging, for the next week.  Use over the counter normal saline nasal spray, as instructed in the Emergency Department, several times per day for the next 2 weeks.  Call your regular medical doctor today to schedule a follow up appointment in the next 3 days.  Return to the Emergency Department immediately if worsening.

## 2013-09-26 NOTE — ED Notes (Signed)
Pt reporting cough, congestion, body aches for about 3 days.  Reporting productive cough.  No relief from mucinex and OTC cold medications.

## 2013-11-26 ENCOUNTER — Emergency Department (HOSPITAL_COMMUNITY): Payer: Self-pay

## 2013-11-26 ENCOUNTER — Emergency Department (HOSPITAL_COMMUNITY)
Admission: EM | Admit: 2013-11-26 | Discharge: 2013-11-26 | Disposition: A | Discharge status: Home / Self Care | Payer: Self-pay | Attending: Emergency Medicine | Admitting: Emergency Medicine

## 2013-11-26 ENCOUNTER — Encounter (HOSPITAL_COMMUNITY): Payer: Self-pay | Admitting: Emergency Medicine

## 2013-11-26 DIAGNOSIS — M79606 Pain in leg, unspecified: Secondary | ICD-10-CM

## 2013-11-26 DIAGNOSIS — G43909 Migraine, unspecified, not intractable, without status migrainosus: Secondary | ICD-10-CM | POA: Insufficient documentation

## 2013-11-26 DIAGNOSIS — Z79899 Other long term (current) drug therapy: Secondary | ICD-10-CM | POA: Insufficient documentation

## 2013-11-26 DIAGNOSIS — R112 Nausea with vomiting, unspecified: Secondary | ICD-10-CM | POA: Insufficient documentation

## 2013-11-26 DIAGNOSIS — M79609 Pain in unspecified limb: Secondary | ICD-10-CM | POA: Insufficient documentation

## 2013-11-26 DIAGNOSIS — Z862 Personal history of diseases of the blood and blood-forming organs and certain disorders involving the immune mechanism: Secondary | ICD-10-CM | POA: Insufficient documentation

## 2013-11-26 DIAGNOSIS — F17200 Nicotine dependence, unspecified, uncomplicated: Secondary | ICD-10-CM | POA: Insufficient documentation

## 2013-11-26 DIAGNOSIS — E663 Overweight: Secondary | ICD-10-CM | POA: Insufficient documentation

## 2013-11-26 DIAGNOSIS — R197 Diarrhea, unspecified: Secondary | ICD-10-CM | POA: Insufficient documentation

## 2013-11-26 LAB — CBC WITH DIFFERENTIAL/PLATELET
Basophils Absolute: 0 10*3/uL (ref 0.0–0.1)
Basophils Relative: 0 % (ref 0–1)
Eosinophils Absolute: 0 10*3/uL (ref 0.0–0.7)
Eosinophils Relative: 0 % (ref 0–5)
HCT: 42.6 % (ref 36.0–46.0)
Hemoglobin: 14.1 g/dL (ref 12.0–15.0)
Lymphocytes Relative: 11 % — ABNORMAL LOW (ref 12–46)
Lymphs Abs: 1.2 10*3/uL (ref 0.7–4.0)
MCH: 28.4 pg (ref 26.0–34.0)
MCHC: 33.1 g/dL (ref 30.0–36.0)
MCV: 85.9 fL (ref 78.0–100.0)
Monocytes Absolute: 0.4 10*3/uL (ref 0.1–1.0)
Monocytes Relative: 4 % (ref 3–12)
Neutro Abs: 9.1 10*3/uL — ABNORMAL HIGH (ref 1.7–7.7)
Neutrophils Relative %: 85 % — ABNORMAL HIGH (ref 43–77)
Platelets: 297 10*3/uL (ref 150–400)
RBC: 4.96 MIL/uL (ref 3.87–5.11)
RDW: 15.7 % — ABNORMAL HIGH (ref 11.5–15.5)
WBC: 10.8 10*3/uL — ABNORMAL HIGH (ref 4.0–10.5)

## 2013-11-26 LAB — D-DIMER, QUANTITATIVE: D-Dimer, Quant: 0.34 ug/mL-FEU (ref 0.00–0.48)

## 2013-11-26 LAB — URINALYSIS, ROUTINE W REFLEX MICROSCOPIC
Bilirubin Urine: NEGATIVE
Glucose, UA: NEGATIVE mg/dL
Hgb urine dipstick: NEGATIVE
Ketones, ur: NEGATIVE mg/dL
Leukocytes, UA: NEGATIVE
Nitrite: NEGATIVE
Protein, ur: NEGATIVE mg/dL
Specific Gravity, Urine: 1.015 (ref 1.005–1.030)
Urobilinogen, UA: 0.2 mg/dL (ref 0.0–1.0)
pH: 5.5 (ref 5.0–8.0)

## 2013-11-26 LAB — COMPREHENSIVE METABOLIC PANEL
ALT: 25 U/L (ref 0–35)
AST: 29 U/L (ref 0–37)
Albumin: 4.4 g/dL (ref 3.5–5.2)
Alkaline Phosphatase: 92 U/L (ref 39–117)
BUN: 8 mg/dL (ref 6–23)
CO2: 26 mEq/L (ref 19–32)
Calcium: 10.1 mg/dL (ref 8.4–10.5)
Chloride: 102 mEq/L (ref 96–112)
Creatinine, Ser: 0.6 mg/dL (ref 0.50–1.10)
GFR calc Af Amer: >90 mL/min (ref >90)
GFR calc non Af Amer: >90 mL/min (ref >90)
Glucose, Bld: 101 mg/dL — ABNORMAL HIGH (ref 70–99)
Potassium: 4.2 mEq/L (ref 3.7–5.3)
Sodium: 141 mEq/L (ref 137–147)
Total Bilirubin: 0.3 mg/dL (ref 0.3–1.2)
Total Protein: 8.4 g/dL — ABNORMAL HIGH (ref 6.0–8.3)

## 2013-11-26 LAB — LIPASE, BLOOD: Lipase: 26 U/L (ref 11–59)

## 2013-11-26 IMAGING — CR DG FEMUR 2+V*R*
4 series · 4 of 4 positions shown · non-contrast
Comparison: None.

CLINICAL DATA: Right leg pain

EXAM:
RIGHT FEMUR - 2 VIEW

[view not recorded (1 of 4)]
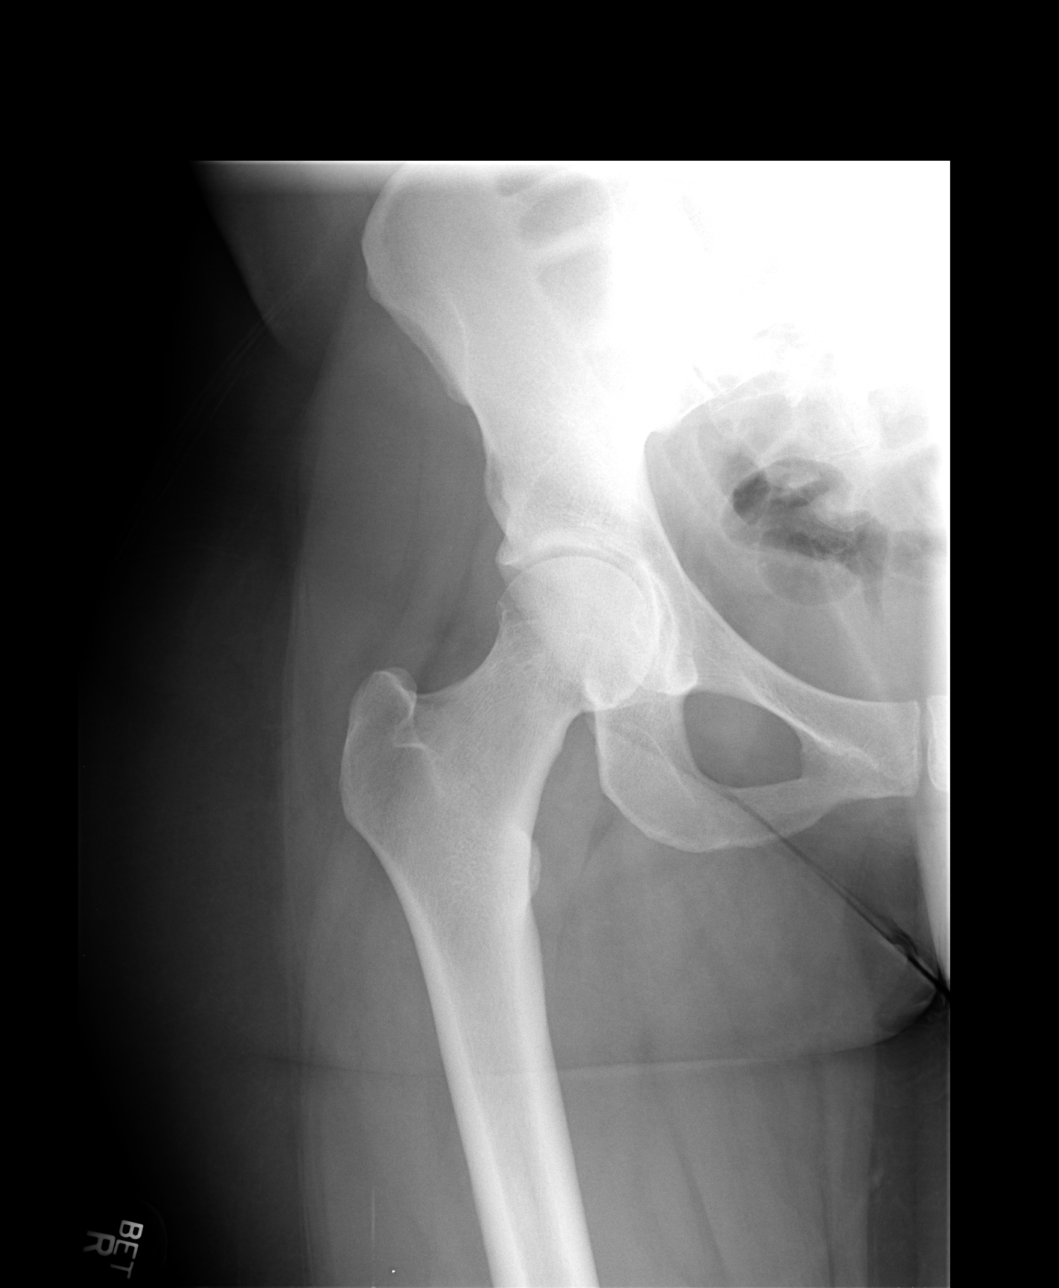

[view not recorded (2 of 4)]
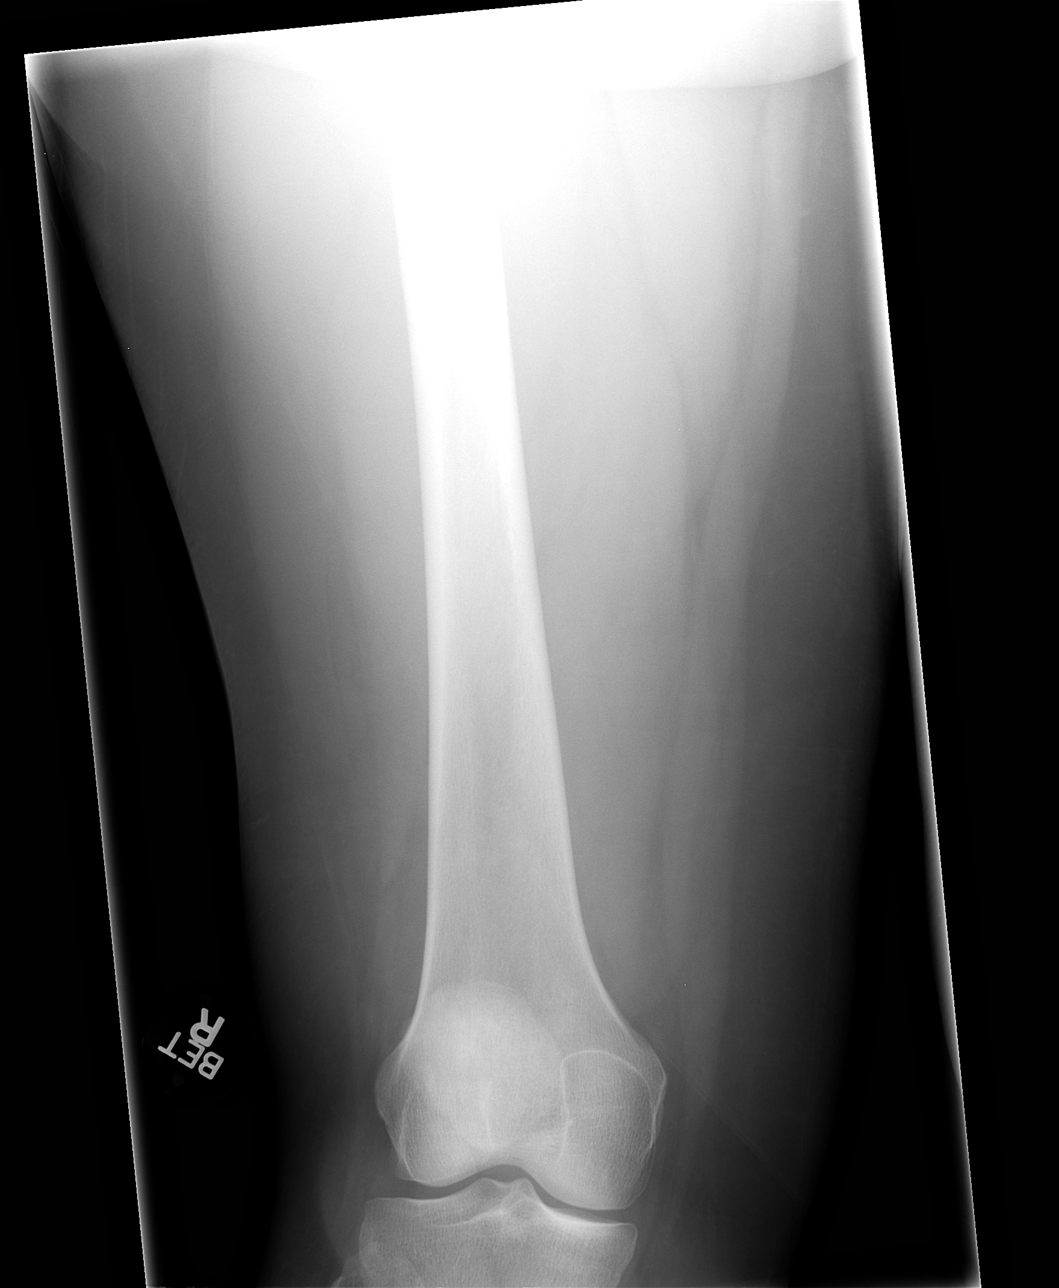

[view not recorded (3 of 4)]
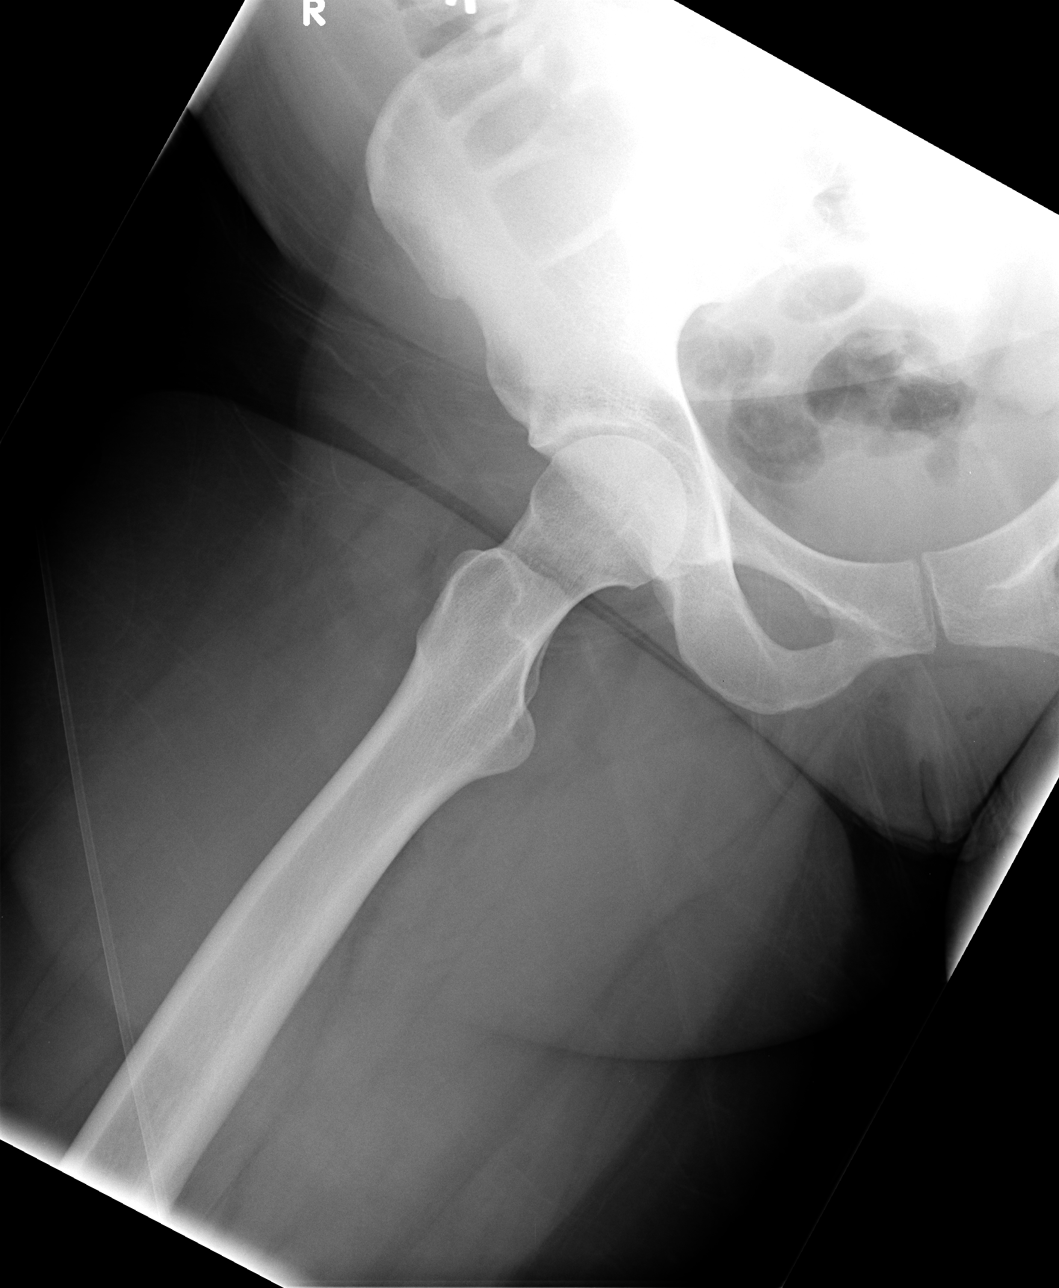

[view not recorded (4 of 4)]
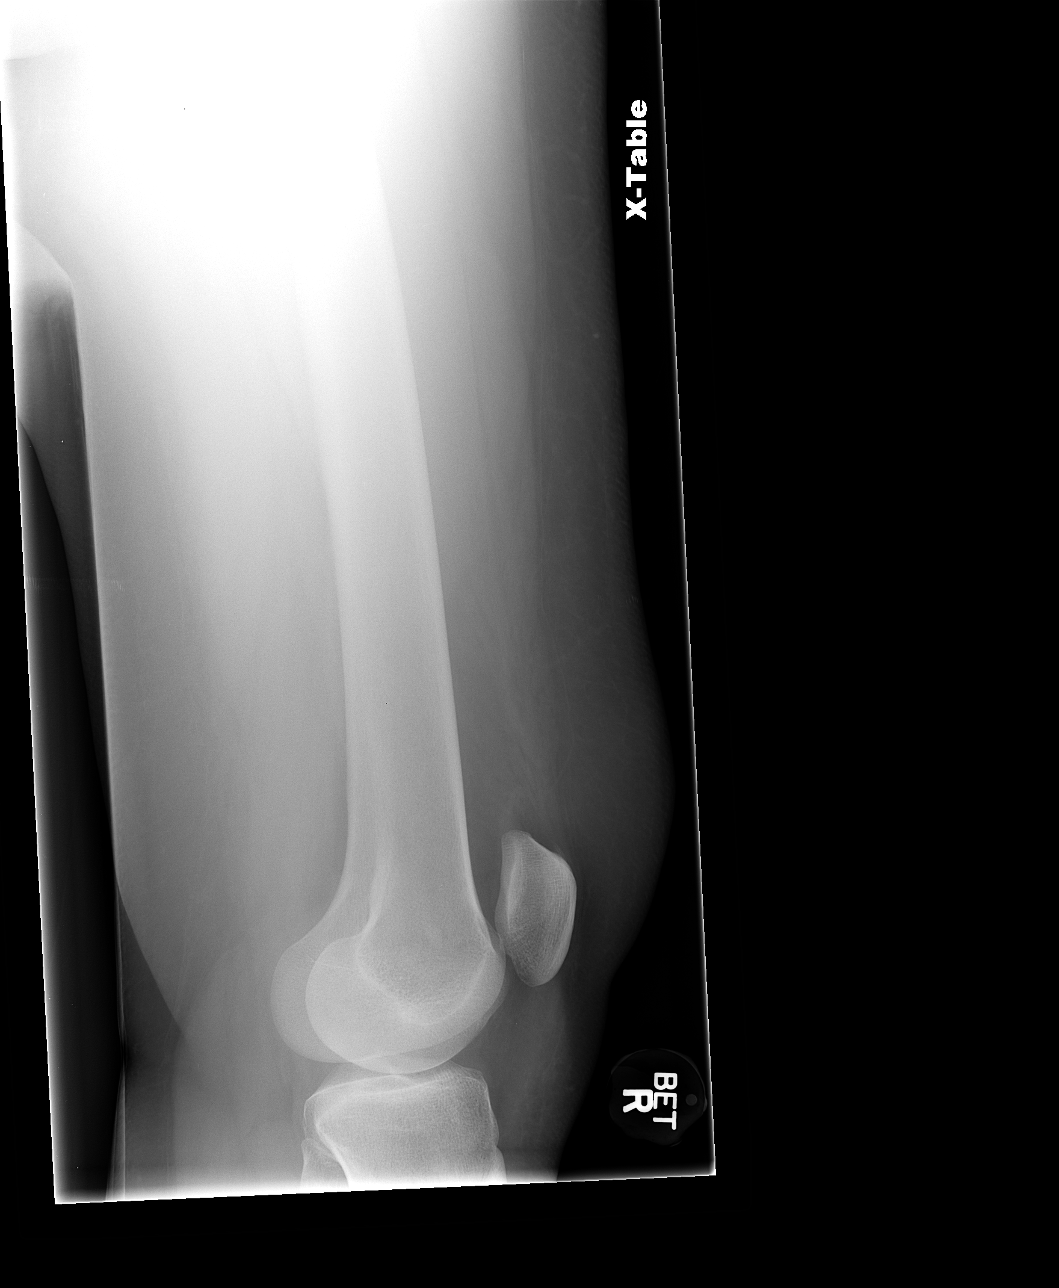

[4 of 4 positions shown; findings below may reference images not displayed]

FINDINGS: There is no evidence of fracture or other focal bone lesions. Soft
tissues are unremarkable.
IMPRESSION: No acute abnormality noted.

## 2013-11-26 IMAGING — CR DG PELVIS 1-2V
1 series · 1 of 1 positions shown · non-contrast
Comparison: None.

CLINICAL DATA: Proximal right lower extremity pain for 2 weeks. No
acute injury.

EXAM:
PELVIS - 1-2 VIEW

[view not recorded]
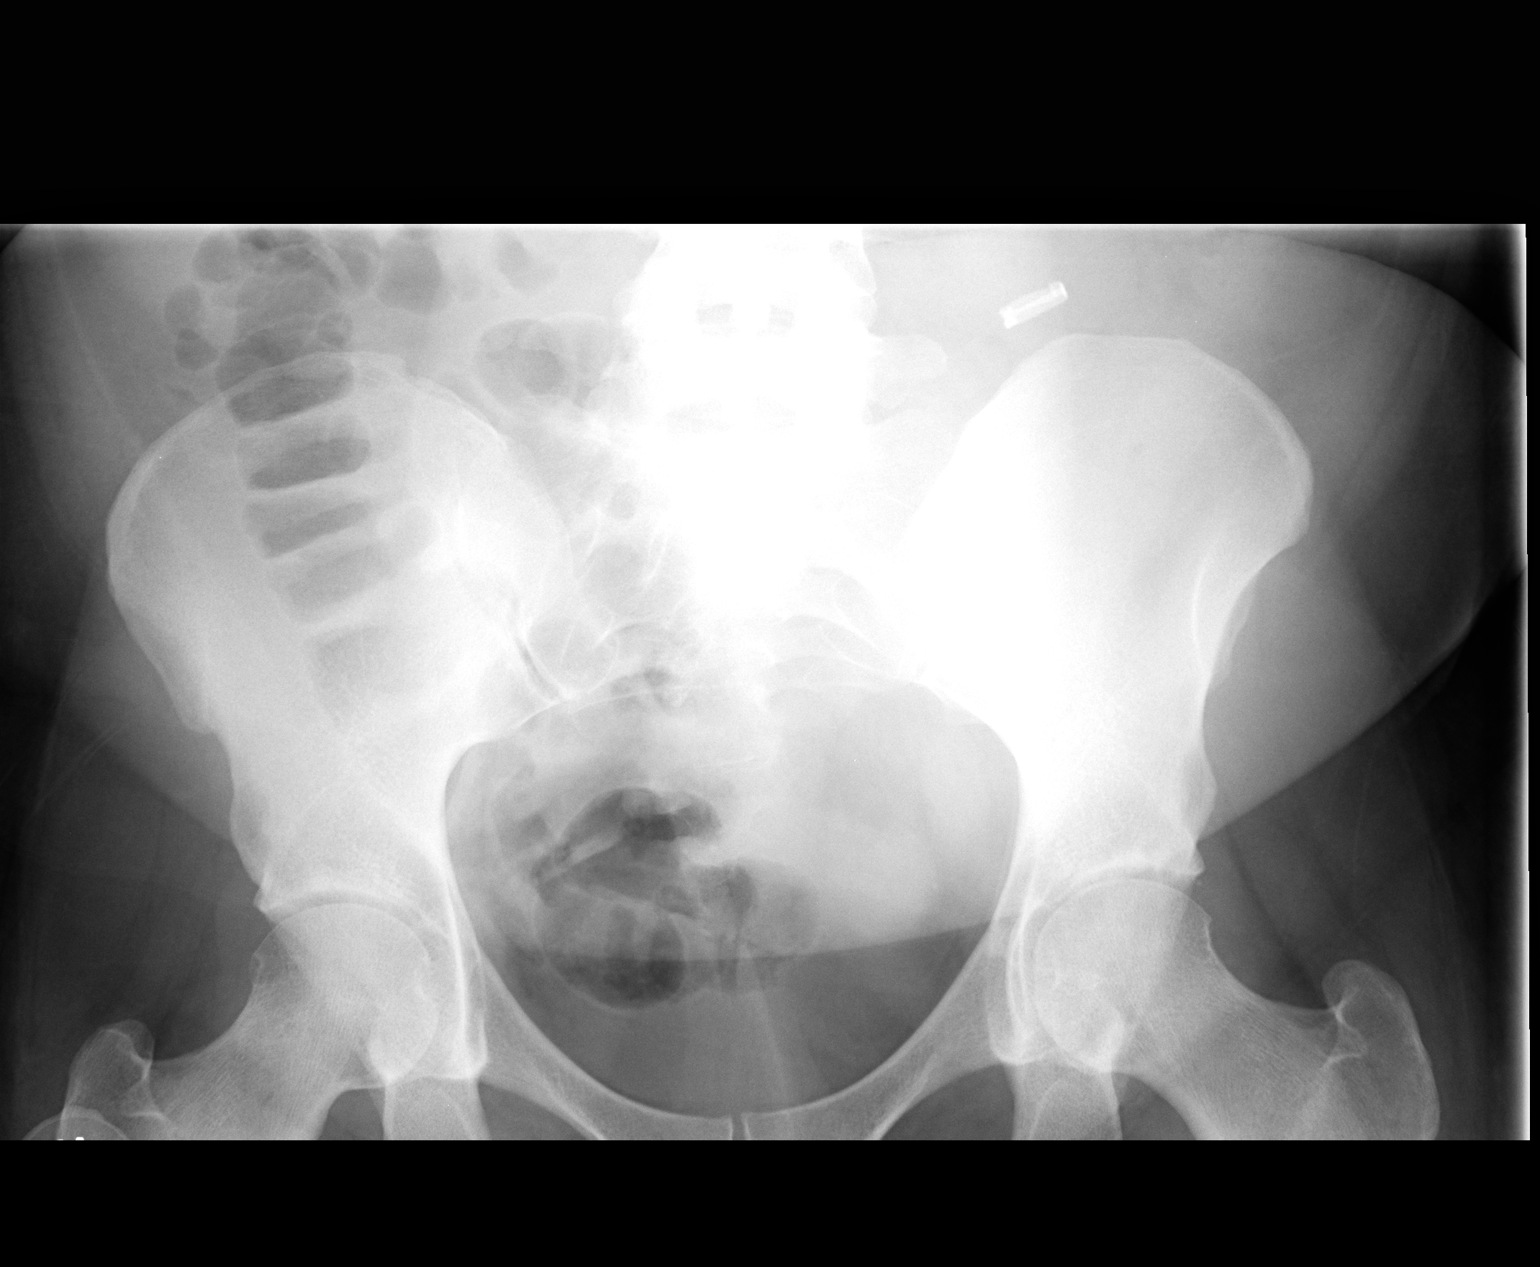

[1 of 1 positions shown; findings below may reference images not displayed]

FINDINGS: The mineralization and alignment are normal. There is no evidence of
acute fracture or dislocation. The hip joint spaces are maintained.
There is no evidence of femoral head avascular necrosis. The
sacroiliac joints appear normal.
IMPRESSION: Normal AP pelvis.

## 2013-11-26 IMAGING — CR DG KNEE COMPLETE 4+V*R*
4 series · 4 of 4 positions shown · non-contrast
Comparison: None.

CLINICAL DATA: Emesis. Right proximal lower extremity pain for 2
weeks without injury.

EXAM:
RIGHT KNEE - COMPLETE 4+ VIEW

[view not recorded (1 of 4)]
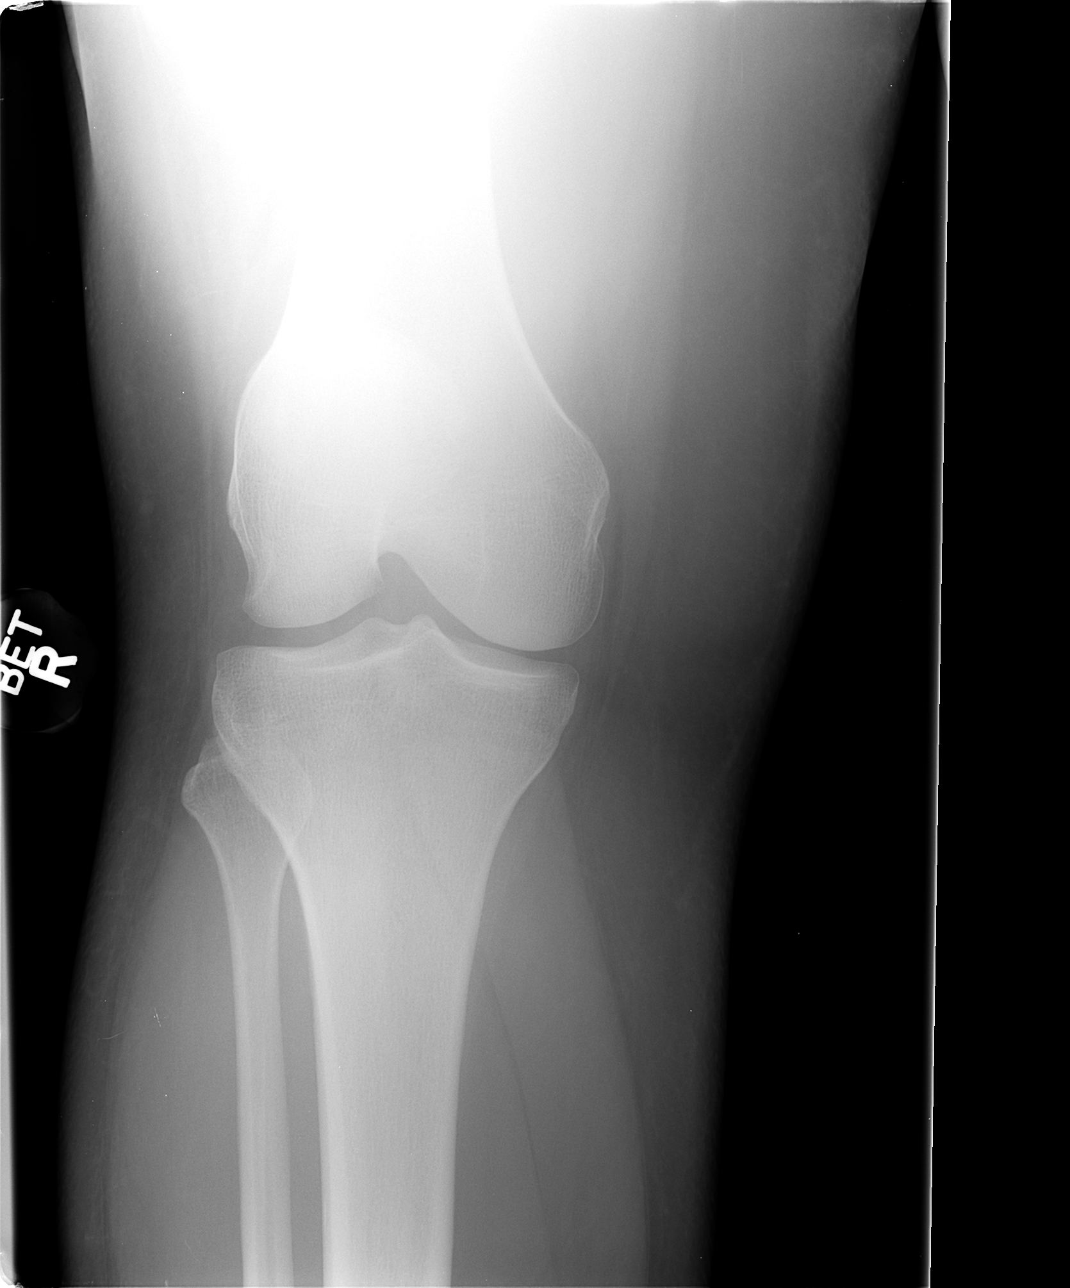

[view not recorded (2 of 4)]
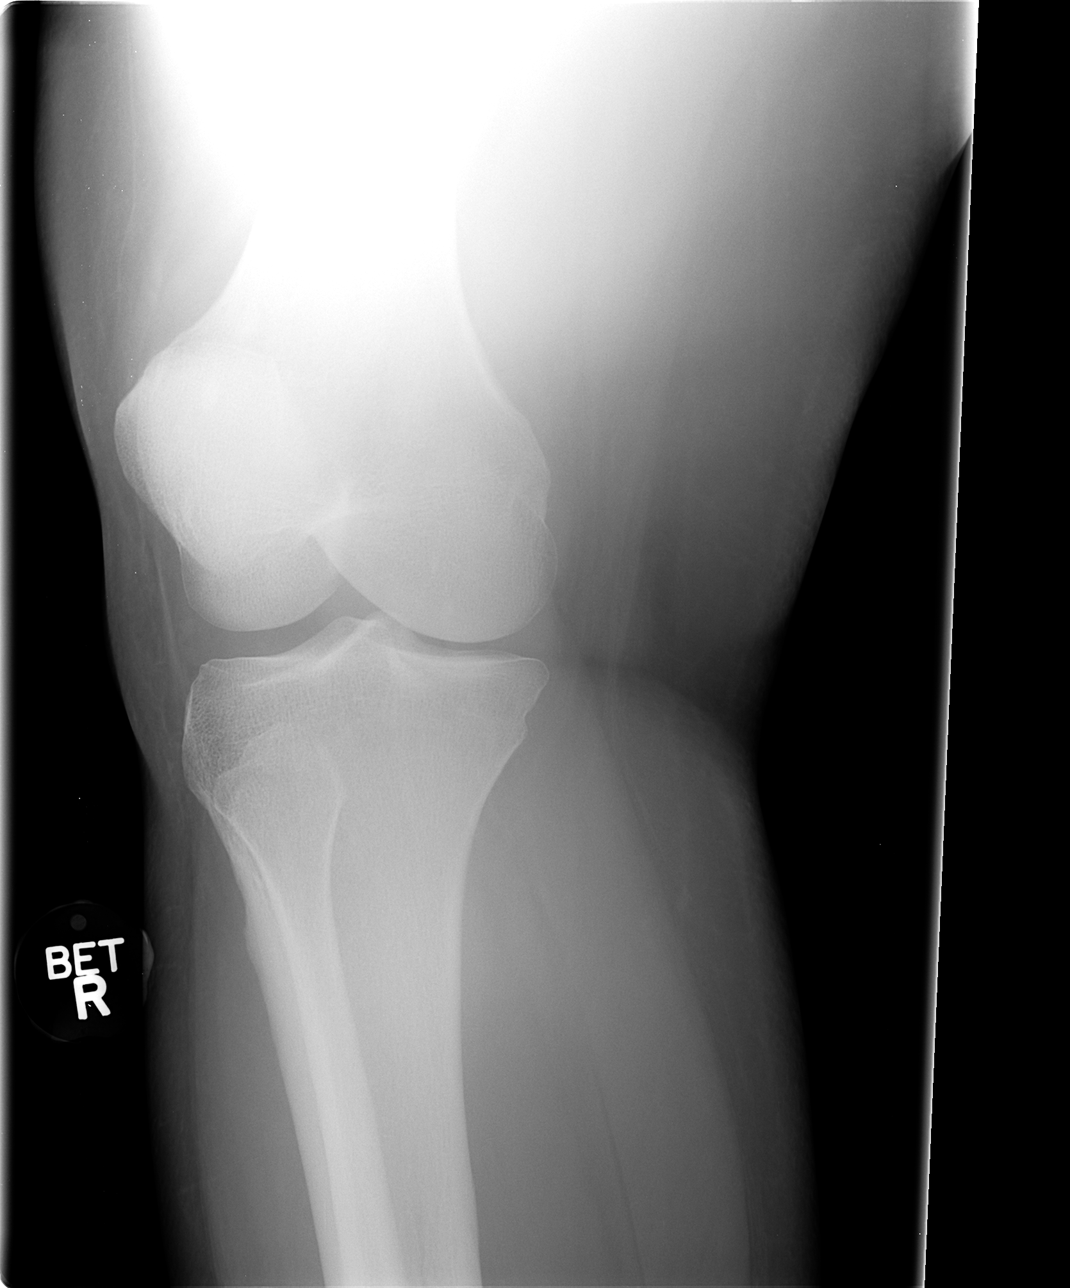

[view not recorded (3 of 4)]
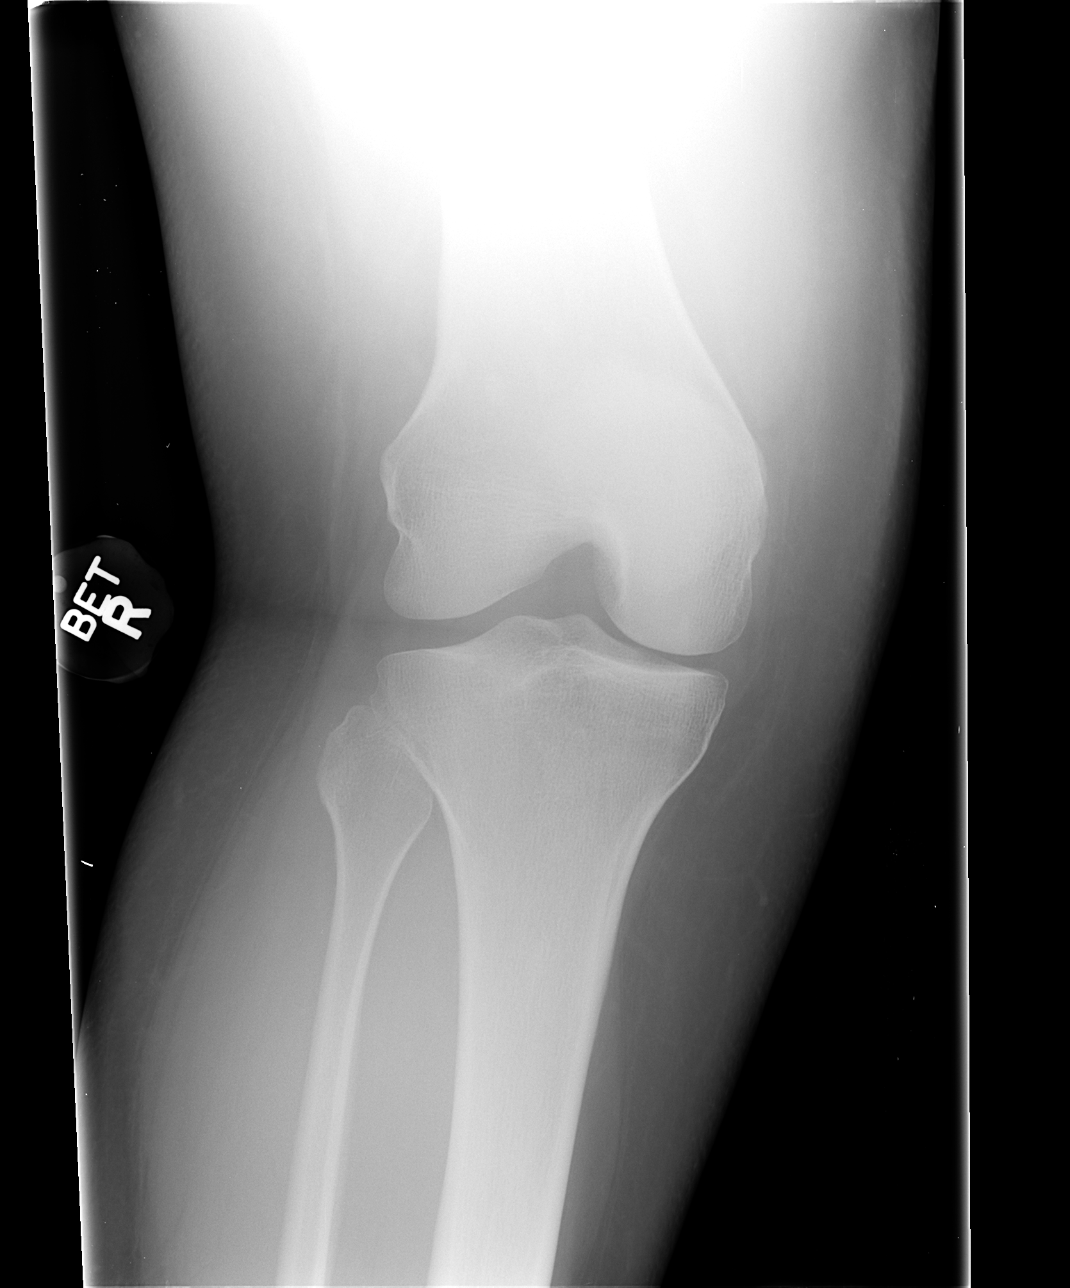

[view not recorded (4 of 4)]
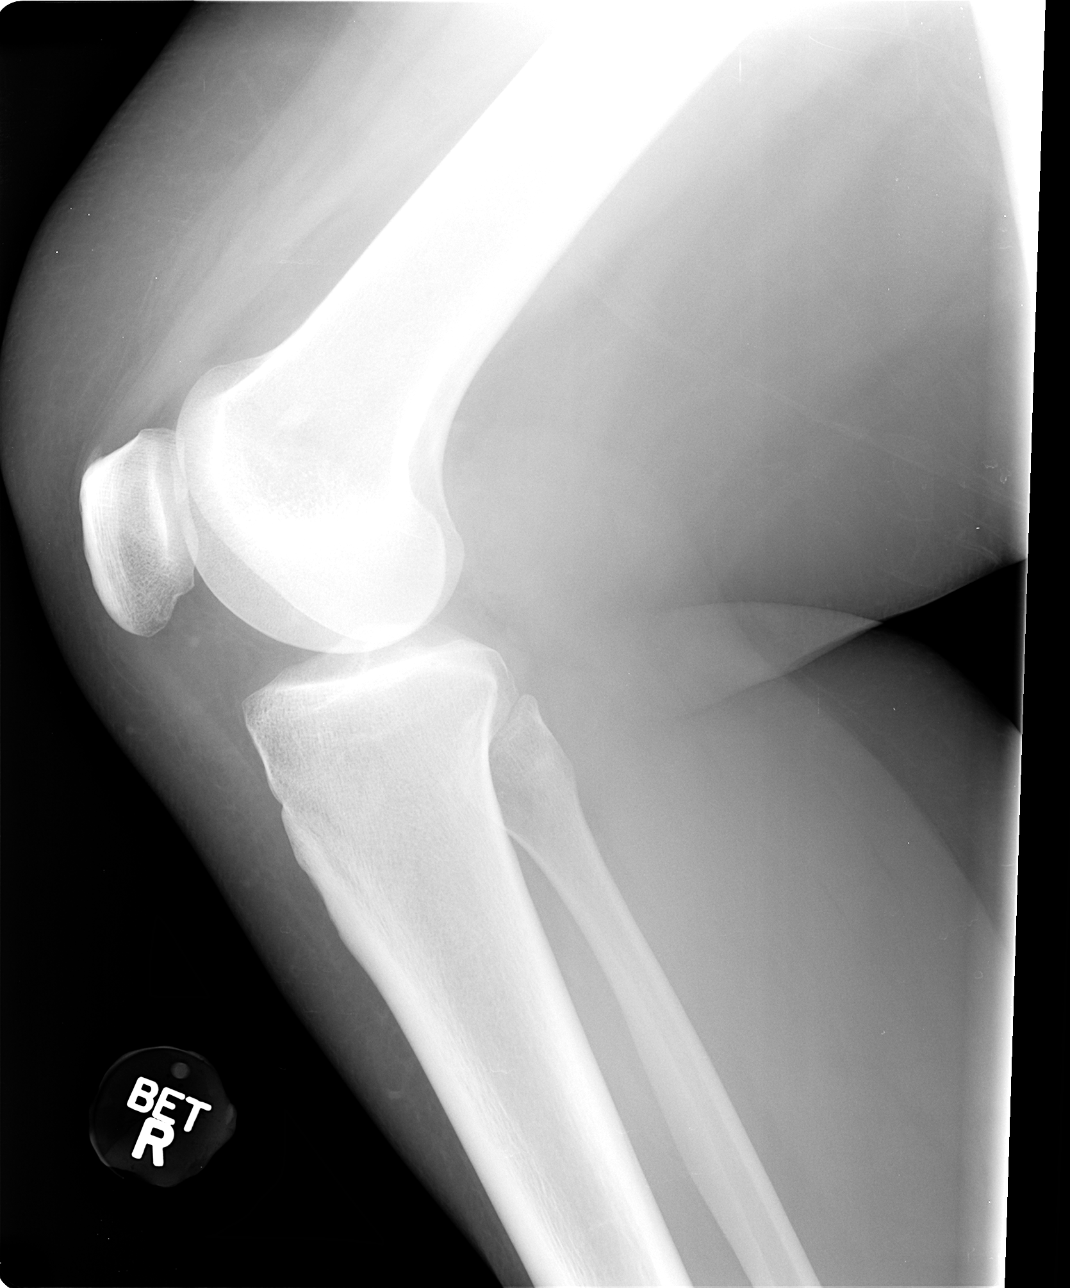

[4 of 4 positions shown; findings below may reference images not displayed]

FINDINGS: There is no evidence of fracture, dislocation, or joint effusion.
There is no evidence of arthropathy or other focal bone abnormality.
Soft tissues are unremarkable.
IMPRESSION: Negative.

## 2013-11-26 MED ORDER — LOPERAMIDE HCL 2 MG PO CAPS
4.0000 mg | ORAL_CAPSULE | Freq: Once | ORAL | Status: AC
  Administered 2013-11-26: 4 mg via ORAL
  Filled 2013-11-26: qty 2

## 2013-11-26 MED ORDER — SODIUM CHLORIDE 0.9 % IV SOLN
1000.0000 mL | Freq: Once | INTRAVENOUS | Status: AC
  Administered 2013-11-26: 1000 mL via INTRAVENOUS

## 2013-11-26 MED ORDER — MORPHINE SULFATE 4 MG/ML IJ SOLN
4.0000 mg | Freq: Once | INTRAMUSCULAR | Status: AC
  Administered 2013-11-26: 4 mg via INTRAVENOUS
  Filled 2013-11-26: qty 1

## 2013-11-26 MED ORDER — ONDANSETRON HCL 4 MG/2ML IJ SOLN
4.0000 mg | Freq: Once | INTRAMUSCULAR | Status: AC
  Administered 2013-11-26: 4 mg via INTRAVENOUS
  Filled 2013-11-26: qty 2

## 2013-11-26 MED ORDER — KETOROLAC TROMETHAMINE 30 MG/ML IJ SOLN
30.0000 mg | Freq: Once | INTRAMUSCULAR | Status: AC
  Administered 2013-11-26: 30 mg via INTRAVENOUS
  Filled 2013-11-26: qty 1

## 2013-11-26 MED ORDER — MORPHINE SULFATE 4 MG/ML IJ SOLN
4.0000 mg | Freq: Once | INTRAMUSCULAR | Status: AC
Start: 2013-11-26 — End: 2013-11-26
  Administered 2013-11-26: 4 mg via INTRAVENOUS
  Filled 2013-11-26: qty 1

## 2013-11-26 MED ORDER — IBUPROFEN 600 MG PO TABS: 600.0000 mg | ORAL_TABLET | Freq: Four times a day (QID) | ORAL | Status: DC | PRN

## 2013-11-26 MED ORDER — ONDANSETRON HCL 4 MG PO TABS: 4.0000 mg | ORAL_TABLET | Freq: Four times a day (QID) | ORAL | Status: DC

## 2013-11-26 MED ORDER — OXYCODONE-ACETAMINOPHEN 5-325 MG PO TABS
2.0000 | ORAL_TABLET | Freq: Once | ORAL | Status: AC
  Administered 2013-11-26: 2 via ORAL
  Filled 2013-11-26: qty 2

## 2013-11-26 NOTE — ED Notes (Signed)
Pt c/o n/v/d and abd pain since last night.  Also reports pain in r inner thigh that radiates down r leg.  Pt says can't tolerate weight bearing on r leg.

## 2013-11-26 NOTE — Discharge Instructions (Signed)
Knee Pain Knee pain can be a result of an injury or other medical conditions. Treatment will depend on the cause of your pain. HOME CARE  Only take medicine as told by your doctor.  Keep a healthy weight. Being overweight can make the knee hurt more.  Stretch before exercising or playing sports.  If there is constant knee pain, change the way you exercise. Ask your doctor for advice.  Make sure shoes fit well. Choose the right shoe for the sport or activity.  Protect your knees. Wear kneepads if needed.  Rest when you are tired. GET HELP RIGHT AWAY IF:   Your knee pain does not stop.  Your knee pain does not get better.  Your knee joint feels hot to the touch.  You have a fever. MAKE SURE YOU:   Understand these instructions.  Will watch this condition.  Will get help right away if you are not doing well or get worse. Document Released: 11/17/2008 Document Revised: 11/13/2011 Document Reviewed: 11/17/2008 [facility] Patient Information 2014 [place], [place]. Nausea and Vomiting Nausea is a sick feeling that often comes before throwing up (vomiting). Vomiting is a reflex where stomach contents come out of your mouth. Vomiting can cause severe loss of body fluids (dehydration). Children and elderly adults can become dehydrated quickly, especially if they also have diarrhea. Nausea and vomiting are symptoms of a condition or disease. It is important to find the cause of your symptoms. CAUSES   Direct irritation of the stomach lining. This irritation can result from increased acid production (gastroesophageal reflux disease), infection, food poisoning, taking certain medicines (such as nonsteroidal anti-inflammatory drugs), alcohol use, or tobacco use.  Signals from the brain.These signals could be caused by a headache, heat exposure, an inner ear disturbance, increased pressure in the brain from injury, infection, a tumor, or a concussion, pain, emotional stimulus, or metabolic  problems.  An obstruction in the gastrointestinal tract (bowel obstruction).  Illnesses such as diabetes, hepatitis, gallbladder problems, appendicitis, kidney problems, cancer, sepsis, atypical symptoms of a heart attack, or eating disorders.  Medical treatments such as chemotherapy and radiation.  Receiving medicine that makes you sleep (general anesthetic) during surgery. DIAGNOSIS Your caregiver may ask for tests to be done if the problems do not improve after a few days. Tests may also be done if symptoms are severe or if the reason for the nausea and vomiting is not clear. Tests may include:  Urine tests.  Blood tests.  Stool tests.  Cultures (to look for evidence of infection).  X-rays or other imaging studies. Test results can help your caregiver make decisions about treatment or the need for additional tests. TREATMENT You need to stay well hydrated. Drink frequently but in small amounts.You may wish to drink water, sports drinks, clear broth, or eat frozen ice pops or gelatin dessert to help stay hydrated.When you eat, eating slowly may help prevent nausea.There are also some antinausea medicines that may help prevent nausea. HOME CARE INSTRUCTIONS   Take all medicine as directed by your caregiver.  If you do not have an appetite, do not force yourself to eat. However, you must continue to drink fluids.  If you have an appetite, eat a normal diet unless your caregiver tells you differently.  Eat a variety of complex carbohydrates (rice, wheat, potatoes, bread), lean meats, yogurt, fruits, and vegetables.  Avoid high-fat foods because they are more difficult to digest.  Drink enough water and fluids to keep your urine clear or pale yellow.  If you are dehydrated, ask your caregiver for specific rehydration instructions. Signs of dehydration may include:  Severe thirst.  Dry lips and mouth.  Dizziness.  Dark urine.  Decreasing urine frequency and  amount.  Confusion.  Rapid breathing or pulse. SEEK IMMEDIATE MEDICAL CARE IF:   You have blood or brown flecks (like coffee grounds) in your vomit.  You have black or bloody stools.  You have a severe headache or stiff neck.  You are confused.  You have severe abdominal pain.  You have chest pain or trouble breathing.  You do not urinate at least once every 8 hours.  You develop cold or clammy skin.  You continue to vomit for longer than 24 to 48 hours.  You have a fever. MAKE SURE YOU:   Understand these instructions.  Will watch your condition.  Will get help right away if you are not doing well or get worse. Document Released: 08/21/2005 Document Revised: 11/13/2011 Document Reviewed: 01/18/2011 [facility] Patient Information 2014 [place], [place].

## 2013-11-26 NOTE — ED Notes (Signed)
EDP made aware of heart rate, temp, and pain level. Pt. Now receiving IV bolus and pain meds. Will reassess pt.

## 2013-11-26 NOTE — ED Provider Notes (Signed)
CSN: [id number]     Arrival date & time 11/26/13  1233 History   First MD Initiated Contact with Patient 11/26/13 1328  This chart was scribed for Merryl Hacker, MD by Anastasia Pall, ED Scribe. This patient was seen in room APA03/APA03 and the patient's care was started at 1:36 PM.    Chief Complaint  Patient presents with  . Emesis  . Leg Pain   (Consider location/radiation/quality/duration/timing/severity/associated sxs/prior Treatment) The history is provided by the patient. No language interpreter was used.   HPI Comments: Virginia Douglas is a 38 y.o. female who presents to the Emergency Department complaining of constant, severe, right LE pain, worse in her right leg, onset 2 days ago. She reports h/o swelling in her bilateral LE. She denies any obvious injury. She reports being ambulatory, but reports movement exacerbates her pain. She reports  nausea, vomiting, diarrhea, cramping abdominal pain. She states her last normal bowel movement was 2 days ago. She denies being around any sick contacts. She has taken Ibuprofen for her pain, without relief. She denies recent hospitalizations. She denies being on hormone supplements. She denies fever, SOB, chest pain, and any other associated symptoms. She reports h/o hysterectomy.   Patient does or tingling in her bilateral upper extremities without any weakness.  PCP - Crisoforo Oxford, PA-C  Past Medical History  Diagnosis Date  . Edema   . Migraine headache   . Anemia     iron deficiency   Past Surgical History  Procedure Laterality Date  . Abdominal hysterectomy    . Cholecystectomy     Family History  Problem Relation Age of Onset  . Diabetes Mother   . Hypertension Mother   . Diabetes Father   . Hypertension Father   . Hypertension Brother   . Cancer Maternal Aunt     breast  . Diabetes Maternal Grandmother   . Diabetes Paternal Grandmother    History  Substance Use Topics  . Smoking status: Current Every Day  Smoker -- 1.00 packs/day    Types: Cigarettes  . Smokeless tobacco: Not on file  . Alcohol Use: No   OB History   Grav Para Term Preterm Abortions TAB SAB Ect Mult Living                 Review of Systems  Constitutional: Negative for fever.  Respiratory: Negative for cough, chest tightness and shortness of breath.   Cardiovascular: Negative for chest pain.  Gastrointestinal: Positive for nausea, vomiting, abdominal pain and diarrhea. Negative for blood in stool.  Genitourinary: Negative for dysuria.  Musculoskeletal: Negative for back pain.       Right leg pain  Skin: Negative for wound.  Neurological: Negative for dizziness, weakness and headaches.       Hand tingling  Psychiatric/Behavioral: Negative for confusion.  All other systems reviewed and are negative.      Allergies  Citalopram  Home Medications   Current Outpatient Rx  Name  Route  Sig  Dispense  Refill  . albuterol (PROVENTIL HFA;VENTOLIN HFA) 108 (90 BASE) MCG/ACT inhaler   Inhalation   Inhale 2 puffs into the lungs every 4 (four) hours as needed for wheezing or shortness of breath.   1 Inhaler   0   . hydrochlorothiazide (HYDRODIURIL) 12.5 MG tablet   Oral   Take 1 tablet (12.5 mg total) by mouth daily.   30 tablet   3   . pantoprazole (PROTONIX) 20 MG tablet   Oral  Take 1 tablet (20 mg total) by mouth daily.   30 tablet   2   . phentermine 37.5 MG capsule   Oral   Take 1 capsule (37.5 mg total) by mouth every morning.   30 capsule   1   . potassium chloride (K-DUR,KLOR-CON) 10 MEQ tablet   Oral   Take 1 tablet (10 mEq total) by mouth daily.   30 tablet   1   . Pseudoeph-Doxylamine-DM-APAP (NYQUIL PO)   Oral   Take 2 capsules by mouth at bedtime as needed and may repeat dose one time if needed (flu-like symptoms.).         Marland Kitchen topiramate (TOPAMAX) 25 MG tablet   Oral   Take 1 tablet (25 mg total) by mouth daily.   30 tablet   1   . ibuprofen (ADVIL,MOTRIN) 600 MG tablet    Oral   Take 1 tablet (600 mg total) by mouth every 6 (six) hours as needed.   30 tablet   0   . ondansetron (ZOFRAN) 4 MG tablet   Oral   Take 1 tablet (4 mg total) by mouth every 6 (six) hours.   12 tablet   0    BP 112/55  Pulse 100  Temp(Src) 98.3 F (36.8 C) (Oral)  Resp 18  Ht 5\' 7"  (1.702 m)  Wt 210 lb (95.255 kg)  BMI 32.88 kg/m2  SpO2 100%  Physical Exam  Nursing note and vitals reviewed. Constitutional: She is oriented to person, place, and time. She appears well-developed and well-nourished. No distress.  Overweight  HENT:  Head: Normocephalic and atraumatic.  Neck: Neck supple.  Cardiovascular: Normal rate, regular rhythm and normal heart sounds.   No murmur heard. Pulmonary/Chest: Effort normal and breath sounds normal. No respiratory distress. She has no wheezes.  Abdominal: Soft. Bowel sounds are normal. There is no tenderness. There is no rebound and no guarding.  Musculoskeletal:  1+ Symmetric bilateral lower extremity edema, tenderness to palpation over the medial right thigh without evidence of deformity, normal range of motion at the hip and knee, no overlying skin changes  Neurological: She is alert and oriented to person, place, and time.  Skin: Skin is warm and dry.  Psychiatric: She has a normal mood and affect.    ED Course  Procedures (including critical care time)  Angiocath insertion Performed by: Thayer Jew, F  Consent: Verbal consent obtained. Risks and benefits: risks, benefits and alternatives were discussed Time out: Immediately prior to procedure a "time out" was called to verify the correct patient, procedure, equipment, support staff and site/side marked as required.  Preparation: Patient was prepped and draped in the usual sterile fashion.  Vein Location: left basillic   =Ultrasound Guided  Gauge: 20  Normal blood return and flush without difficulty Patient tolerance: Patient tolerated the procedure well with no  immediate complications.     DIAGNOSTIC STUDIES: Oxygen Saturation is 100% on room air, normal by my interpretation.    COORDINATION OF CARE: 1:40 PM-Discussed treatment plan which includes pain and nausea medication, D-dimer, blood work, and UA with pt at bedside and pt agreed to plan.   Results for orders placed during the hospital encounter of 11/26/13  CBC WITH DIFFERENTIAL      Result Value Ref Range   WBC 10.8 (*) 4.0 - 10.5 K/uL   RBC 4.96  3.87 - 5.11 MIL/uL   Hemoglobin 14.1  12.0 - 15.0 g/dL   HCT 42.6  36.0 - 46.0 %  MCV 85.9  78.0 - 100.0 fL   MCH 28.4  26.0 - 34.0 pg   MCHC 33.1  30.0 - 36.0 g/dL   RDW 15.7 (*) 11.5 - 15.5 %   Platelets 297  150 - 400 K/uL   Neutrophils Relative % 85 (*) 43 - 77 %   Neutro Abs 9.1 (*) 1.7 - 7.7 K/uL   Lymphocytes Relative 11 (*) 12 - 46 %   Lymphs Abs 1.2  0.7 - 4.0 K/uL   Monocytes Relative 4  3 - 12 %   Monocytes Absolute 0.4  0.1 - 1.0 K/uL   Eosinophils Relative 0  0 - 5 %   Eosinophils Absolute 0.0  0.0 - 0.7 K/uL   Basophils Relative 0  0 - 1 %   Basophils Absolute 0.0  0.0 - 0.1 K/uL  URINALYSIS, ROUTINE W REFLEX MICROSCOPIC      Result Value Ref Range   Color, Urine YELLOW  YELLOW   APPearance CLEAR  CLEAR   Specific Gravity, Urine 1.015  1.005 - 1.030   pH 5.5  5.0 - 8.0   Glucose, UA NEGATIVE  NEGATIVE mg/dL   Hgb urine dipstick NEGATIVE  NEGATIVE   Bilirubin Urine NEGATIVE  NEGATIVE   Ketones, ur NEGATIVE  NEGATIVE mg/dL   Protein, ur NEGATIVE  NEGATIVE mg/dL   Urobilinogen, UA 0.2  0.0 - 1.0 mg/dL   Nitrite NEGATIVE  NEGATIVE   Leukocytes, UA NEGATIVE  NEGATIVE  COMPREHENSIVE METABOLIC PANEL      Result Value Ref Range   Sodium 141  137 - 147 mEq/L   Potassium 4.2  3.7 - 5.3 mEq/L   Chloride 102  96 - 112 mEq/L   CO2 26  19 - 32 mEq/L   Glucose, Bld 101 (*) 70 - 99 mg/dL   BUN 8  6 - 23 mg/dL   Creatinine, Ser 0.60  0.50 - 1.10 mg/dL   Calcium 10.1  8.4 - 10.5 mg/dL   Total Protein 8.4 (*) 6.0 - 8.3  g/dL   Albumin 4.4  3.5 - 5.2 g/dL   AST 29  0 - 37 U/L   ALT 25  0 - 35 U/L   Alkaline Phosphatase 92  39 - 117 U/L   Total Bilirubin 0.3  0.3 - 1.2 mg/dL   GFR calc non Af Amer >90  >90 mL/min   GFR calc Af Amer >90  >90 mL/min  D-DIMER, QUANTITATIVE      Result Value Ref Range   D-Dimer, Quant 0.34  0.00 - 0.48 ug/mL-FEU  LIPASE, BLOOD      Result Value Ref Range   Lipase 26  11 - 59 U/L   Dg Pelvis 1-2 Views  11/26/2013   CLINICAL DATA:  Proximal right lower extremity pain for 2 weeks. No acute injury.  EXAM: PELVIS - 1-2 VIEW  COMPARISON:  None.  FINDINGS: The mineralization and alignment are normal. There is no evidence of acute fracture or dislocation. The hip joint spaces are maintained. There is no evidence of femoral head avascular necrosis. The sacroiliac joints appear normal.  IMPRESSION: Normal AP pelvis.   Electronically Signed   By: Camie Patience M.D.   On: 11/26/2013 18:23   Dg Femur Right  11/26/2013   CLINICAL DATA:  Right leg pain  EXAM: RIGHT FEMUR - 2 VIEW  COMPARISON:  None.  FINDINGS: There is no evidence of fracture or other focal bone lesions. Soft tissues are unremarkable.  IMPRESSION: No acute  abnormality noted.   Electronically Signed   By: Inez Catalina M.D.   On: 11/26/2013 18:22   Dg Knee Complete 4 Views Right  11/26/2013   CLINICAL DATA:  Emesis. Right proximal lower extremity pain for 2 weeks without injury.  EXAM: RIGHT KNEE - COMPLETE 4+ VIEW  COMPARISON:  None.  FINDINGS: There is no evidence of fracture, dislocation, or joint effusion. There is no evidence of arthropathy or other focal bone abnormality. Soft tissues are unremarkable.  IMPRESSION: Negative.   Electronically Signed   By: Logan Bores   On: 11/26/2013 18:55    No results found.   EKG Interpretation None     Medications  ketorolac (TORADOL) 30 MG/ML injection 30 mg (30 mg Intravenous Given 11/26/13 1432)  ondansetron (ZOFRAN) injection 4 mg (4 mg Intravenous Given 11/26/13 1432)   loperamide (IMODIUM) capsule 4 mg (4 mg Oral Given 11/26/13 1432)  morphine 4 MG/ML injection 4 mg (4 mg Intravenous Given 11/26/13 1630)   MDM   Final diagnoses:  Nausea vomiting and diarrhea  Leg pain    Patient presents with nausea, vomiting diarrhea. Abdominal exam is benign. Patient also reports right thigh pain. Reports difficulty ambulating. Patient is nontoxic on exam.  Initial vital signs are reassuring.  Patient was given fluids and pain medication. Basic labwork is reassuring. Abdominal exam is benign. Dimer is negative in screening for blood clots. Patient has no evidence of deformity; however she continues to complain of pain. Plain films were obtained and are negative. Patient is now complaining of more knee pain. No overlying skin changes and no systemic signs or symptoms suggestive of a septic joint. Gout is a consideration. Discuss with patient that treatment would be with anti-inflammatories. Will problem anti-inflammatories for the next 2-3 days. Patient was able to ambulate. Will be discharged home with primary care followup.  After history, exam, and medical workup I feel the patient has been appropriately medically screened and is safe for discharge home. Pertinent diagnoses were discussed with the patient. Patient was given return precautions.      I personally performed the services described in this documentation, which was scribed in my presence. The recorded information has been reviewed and is accurate.    Merryl Hacker, MD 11/26/13 [phone]

## 2013-11-26 NOTE — ED Notes (Signed)
Pt ambulated into hallway with moderate assistance with limp.  With ambulation, pt reports pain worsening from posterior thigh to anterior lower leg/shin.  Pt reports worsening pain in right knee also.  edp notified.

## 2013-11-27 ENCOUNTER — Telehealth: Payer: Self-pay | Admitting: Medical

## 2013-11-27 NOTE — Telephone Encounter (Signed)
Pt was recently seen in the ED. Per shane pt needs follow up appt. Message was left for pt to call to make an appt.

## 2013-12-08 ENCOUNTER — Other Ambulatory Visit: Payer: Self-pay | Admitting: Medical

## 2013-12-08 ENCOUNTER — Telehealth: Payer: Self-pay | Admitting: Internal Medicine

## 2013-12-08 MED ORDER — TOPIRAMATE 25 MG PO TABS: 25.0000 mg | ORAL_TABLET | Freq: Every day | ORAL | Status: DC

## 2013-12-08 NOTE — Telephone Encounter (Signed)
Pt can not make an appt until she gets her insurance card.

## 2013-12-08 NOTE — Telephone Encounter (Signed)
Refill request for topiramate 25mg  to [facility] pharmacy [place]

## 2013-12-08 NOTE — Telephone Encounter (Signed)
30 day sent, make f/u appt

## 2013-12-10 ENCOUNTER — Other Ambulatory Visit: Payer: Self-pay | Admitting: Medical

## 2013-12-11 NOTE — Telephone Encounter (Signed)
Refill this med?

## 2014-07-26 ENCOUNTER — Emergency Department (HOSPITAL_COMMUNITY)
Admission: EM | Admit: 2014-07-26 | Discharge: 2014-07-27 | Disposition: A | Discharge status: Home / Self Care | Payer: 59 | Attending: Emergency Medicine | Admitting: Emergency Medicine

## 2014-07-26 ENCOUNTER — Encounter (HOSPITAL_COMMUNITY): Payer: Self-pay | Admitting: *Deleted

## 2014-07-26 DIAGNOSIS — R102 Pelvic and perineal pain: Secondary | ICD-10-CM

## 2014-07-26 DIAGNOSIS — Z862 Personal history of diseases of the blood and blood-forming organs and certain disorders involving the immune mechanism: Secondary | ICD-10-CM | POA: Insufficient documentation

## 2014-07-26 DIAGNOSIS — Z79899 Other long term (current) drug therapy: Secondary | ICD-10-CM | POA: Diagnosis not present

## 2014-07-26 DIAGNOSIS — Z791 Long term (current) use of non-steroidal anti-inflammatories (NSAID): Secondary | ICD-10-CM | POA: Diagnosis not present

## 2014-07-26 DIAGNOSIS — G43909 Migraine, unspecified, not intractable, without status migrainosus: Secondary | ICD-10-CM | POA: Insufficient documentation

## 2014-07-26 DIAGNOSIS — Z720 Tobacco use: Secondary | ICD-10-CM | POA: Diagnosis not present

## 2014-07-26 DIAGNOSIS — R109 Unspecified abdominal pain: Secondary | ICD-10-CM | POA: Diagnosis present

## 2014-07-26 DIAGNOSIS — Z9089 Acquired absence of other organs: Secondary | ICD-10-CM | POA: Insufficient documentation

## 2014-07-26 MED ORDER — KETOROLAC TROMETHAMINE 60 MG/2ML IM SOLN
60.0000 mg | Freq: Once | INTRAMUSCULAR | Status: AC
  Administered 2014-07-27: 60 mg via INTRAMUSCULAR
  Filled 2014-07-26: qty 2

## 2014-07-26 NOTE — ED Notes (Signed)
Pt c/o supra pubic pain that started x 1 day ago; pt denies n/v/d or any urinary problems

## 2014-07-27 LAB — URINALYSIS, ROUTINE W REFLEX MICROSCOPIC
Bilirubin Urine: NEGATIVE
Glucose, UA: NEGATIVE mg/dL
Hgb urine dipstick: NEGATIVE
Ketones, ur: NEGATIVE mg/dL
Leukocytes, UA: NEGATIVE
Nitrite: NEGATIVE
Protein, ur: NEGATIVE mg/dL
Specific Gravity, Urine: >1.03 — ABNORMAL HIGH (ref 1.005–1.030)
Urobilinogen, UA: 0.2 mg/dL (ref 0.0–1.0)
pH: 5.5 (ref 5.0–8.0)

## 2014-07-27 MED ORDER — NAPROXEN 500 MG PO TABS: 500.0000 mg | ORAL_TABLET | Freq: Two times a day (BID) | ORAL | Status: DC

## 2014-07-27 MED ORDER — OXYCODONE-ACETAMINOPHEN 5-325 MG PO TABS
2.0000 | ORAL_TABLET | Freq: Once | ORAL | Status: AC
  Administered 2014-07-27: 2 via ORAL
  Filled 2014-07-27: qty 2

## 2014-07-27 MED ORDER — OXYCODONE-ACETAMINOPHEN 5-325 MG PO TABS: 1.0000 | ORAL_TABLET | ORAL | Status: DC | PRN

## 2014-07-27 NOTE — ED Notes (Signed)
Discharge instructions given, pt demonstrated teach back and verbal understanding. No concerns voiced.  

## 2014-07-27 NOTE — ED Provider Notes (Signed)
CSN: [id number]     Arrival date & time 07/26/14  2319 History   First MD Initiated Contact with Patient 07/26/14 2332     Chief Complaint  Patient presents with  . Abdominal Pain     (Consider location/radiation/quality/duration/timing/severity/associated sxs/prior Treatment) The history is provided by the patient.   Virginia Douglas is a 38 y.o. female with a past medical history and surgical history significant for ovarian cysts and an abdominal partial hysterectomy with a now 3-4 day complaint of left lower pelvic pain.  She describes an intermittent nagging sensation which has become comstant since yesterday and worsened with walking and bending over or applying pressure to the site.  She reports her symptoms are similar to prior problems with ovarian cysts.  She denies fevers, chills, nausea, vomiting, dysuria, vaginal discharge or pain, diarrhea or constipation.  She has maintained a normal appetite. She has taken ibuprofen, last dose around 4 pm today with mild improvement.      Past Medical History  Diagnosis Date  . Edema   . Migraine headache   . Anemia     iron deficiency   Past Surgical History  Procedure Laterality Date  . Abdominal hysterectomy    . Cholecystectomy     Family History  Problem Relation Age of Onset  . Diabetes Mother   . Hypertension Mother   . Diabetes Father   . Hypertension Father   . Hypertension Brother   . Cancer Maternal Aunt     breast  . Diabetes Maternal Grandmother   . Diabetes Paternal Grandmother    History  Substance Use Topics  . Smoking status: Current Every Day Smoker -- 0.50 packs/day    Types: Cigarettes  . Smokeless tobacco: Not on file  . Alcohol Use: No   OB History    No data available     Review of Systems  Constitutional: Negative for fever.  HENT: Negative for congestion and sore throat.   Eyes: Negative.   Respiratory: Negative for chest tightness and shortness of breath.   Cardiovascular: Negative for  chest pain.  Gastrointestinal: Positive for abdominal pain. Negative for nausea.  Genitourinary: Negative.  Negative for dysuria, hematuria, vaginal discharge and vaginal pain.  Musculoskeletal: Negative for joint swelling, arthralgias and neck pain.  Skin: Negative.  Negative for rash and wound.  Neurological: Negative for dizziness, weakness, light-headedness, numbness and headaches.  Psychiatric/Behavioral: Negative.       Allergies  Citalopram  Home Medications   Prior to Admission medications   Medication Sig Start Date End Date Taking? Authorizing Provider  albuterol (PROVENTIL HFA;VENTOLIN HFA) 108 (90 BASE) MCG/ACT inhaler Inhale 2 puffs into the lungs every 4 (four) hours as needed for wheezing or shortness of breath. 09/26/13   Francine Graven, DO  hydrochlorothiazide (HYDRODIURIL) 12.5 MG tablet Take 1 tablet (12.5 mg total) by mouth daily. 02/12/13   Camelia Eng Tysinger, PA-C  ibuprofen (ADVIL,MOTRIN) 600 MG tablet Take 1 tablet (600 mg total) by mouth every 6 (six) hours as needed. 11/26/13   Merryl Hacker, MD  naproxen (NAPROSYN) 500 MG tablet Take 1 tablet (500 mg total) by mouth 2 (two) times daily. 07/27/14   Evalee Jefferson, PA-C  ondansetron (ZOFRAN) 4 MG tablet Take 1 tablet (4 mg total) by mouth every 6 (six) hours. 11/26/13   Merryl Hacker, MD  oxyCODONE-acetaminophen (PERCOCET/ROXICET) 5-325 MG per tablet Take 1-2 tablets by mouth every 4 (four) hours as needed. 07/27/14   Evalee Jefferson, PA-C  pantoprazole (  PROTONIX) 20 MG tablet Take 1 tablet (20 mg total) by mouth daily. 04/30/13   Camelia Eng Tysinger, PA-C  phentermine 37.5 MG capsule Take 1 capsule (37.5 mg total) by mouth every morning. 08/08/13   Camelia Eng Tysinger, PA-C  potassium chloride (K-DUR,KLOR-CON) 10 MEQ tablet Take 1 tablet (10 mEq total) by mouth daily. 03/17/13   Camelia Eng Tysinger, PA-C  Pseudoeph-Doxylamine-DM-APAP (NYQUIL PO) Take 2 capsules by mouth at bedtime as needed and may repeat dose one time if needed  (flu-like symptoms.).    Historical Provider, MD  topiramate (TOPAMAX) 25 MG tablet Take 1 tablet (25 mg total) by mouth daily. 12/08/13   Camelia Eng Tysinger, PA-C   BP 123/85 mmHg  Temp(Src) 97.5 F (36.4 C) (Oral)  Resp 20  Ht 5\' 7"  (1.702 m)  Wt 232 lb (105.235 kg)  BMI 36.33 kg/m2  SpO2 99% Physical Exam  Constitutional: She appears well-developed and well-nourished.  HENT:  Head: Normocephalic and atraumatic.  Eyes: Conjunctivae are normal.  Neck: Normal range of motion.  Cardiovascular: Normal rate, regular rhythm, normal heart sounds and intact distal pulses.   Pulmonary/Chest: Effort normal and breath sounds normal. She has no wheezes.  Abdominal: Soft. Bowel sounds are normal. There is no tenderness.  Genitourinary: Vagina normal. Right adnexum displays no mass and no tenderness. Left adnexum displays tenderness and fullness. No erythema or tenderness in the vagina. No vaginal discharge found.  Cervix absent.  Bimanual performed only.  Musculoskeletal: Normal range of motion.  Neurological: She is alert.  Skin: Skin is warm and dry.  Psychiatric: She has a normal mood and affect.  Nursing note and vitals reviewed.   ED Course  Procedures (including critical care time) Labs Review Labs Reviewed  URINALYSIS, ROUTINE W REFLEX MICROSCOPIC - Abnormal; Notable for the following:    Specific Gravity, Urine >1.030 (*)    All other components within normal limits    Imaging Review No results found.   EKG Interpretation None      MDM   Final diagnoses:  Pelvic pain in female    Suspect ovarian cyst based on history and exam.  Pt does not have a surgical abdomen on multiple exams.  Pain was gradually progressive over several days, not consistent with torsion.  Discussed with Dr. Christy Gentles prior to dc home.  She was given toradol injection with minimal pain relief.  Added percocet which has improved sx, prescribed naproxen and percocet. Advised appt with her gyn Dr.  Glo Herring for further eval.  Discussed she will probably require [place] to further assess per Dr. Johnnye Sima discretion.  She will call in am for appt. Advised return here in the interim for any worsened sx.  The patient appears reasonably screened and/or stabilized for discharge and I doubt any other medical condition or other [facility] requiring further screening, evaluation, or treatment in the ED at this time prior to discharge.      Evalee Jefferson, PA-C 07/27/14 [place], MD 07/28/14 [phone]

## 2014-07-27 NOTE — Discharge Instructions (Signed)
Pelvic Pain Female pelvic pain can be caused by many different things and start from a variety of places. Pelvic pain refers to pain that is located in the lower half of the abdomen and between your hips. The pain may occur over a short period of time (acute) or may be reoccurring (chronic). The cause of pelvic pain may be related to disorders affecting the female reproductive organs (gynecologic), but it may also be related to the bladder, kidney stones, an intestinal complication, or muscle or skeletal problems. Getting help right away for pelvic pain is important, especially if there has been severe, sharp, or a sudden onset of unusual pain. It is also important to get help right away because some types of pelvic pain can be life threatening.  CAUSES  Below are only some of the causes of pelvic pain. The causes of pelvic pain can be in one of several categories.   Gynecologic.  Pelvic inflammatory disease.  Sexually transmitted infection.  Ovarian cyst or a twisted ovarian ligament (ovarian torsion).  Uterine lining that grows outside the uterus (endometriosis).  Fibroids, cysts, or tumors.  Ovulation.  Pregnancy.  Pregnancy that occurs outside the uterus (ectopic pregnancy).  Miscarriage.  Labor.  Abruption of the placenta or ruptured uterus.  Infection.  Uterine infection (endometritis).  Bladder infection.  Diverticulitis.  Miscarriage related to a uterine infection (septic abortion).  Bladder.  Inflammation of the bladder (cystitis).  Kidney stone(s).  Gastrointestinal.  Constipation.  Diverticulitis.  Neurologic.  Trauma.  Feeling pelvic pain because of mental or emotional causes (psychosomatic).  Cancers of the bowel or pelvis. EVALUATION  Your caregiver will want to take a careful history of your concerns. This includes recent changes in your health, a careful gynecologic history of your periods (menses), and a sexual history. Obtaining your family  history and medical history is also important. Your caregiver may suggest a pelvic exam. A pelvic exam will help identify the location and severity of the pain. It also helps in the evaluation of which organ system may be involved. In order to identify the cause of the pelvic pain and be properly treated, your caregiver may order tests. These tests may include:   A pregnancy test.  Pelvic ultrasonography.  An X-ray exam of the abdomen.  A urinalysis or evaluation of vaginal discharge.  Blood tests. HOME CARE INSTRUCTIONS   Only take over-the-counter or prescription medicines for pain, discomfort, or fever as directed by your caregiver.   Rest as directed by your caregiver.   Eat a balanced diet.   Drink enough fluids to make your urine clear or pale yellow, or as directed.   Avoid sexual intercourse if it causes pain.   Apply warm or cold compresses to the lower abdomen depending on which one helps the pain.   Avoid stressful situations.   Keep a journal of your pelvic pain. Write down when it started, where the pain is located, and if there are things that seem to be associated with the pain, such as food or your menstrual cycle.  Follow up with your caregiver as directed.  SEEK MEDICAL CARE IF:  Your medicine does not help your pain.  You have abnormal vaginal discharge. SEEK IMMEDIATE MEDICAL CARE IF:   You have heavy bleeding from the vagina.   Your pelvic pain increases.   You feel light-headed or faint.   You have chills.   You have pain with urination or blood in your urine.   You have uncontrolled diarrhea   or vomiting.   You have a fever or persistent symptoms for more than 3 days.  You have a fever and your symptoms suddenly get worse.   You are being physically or sexually abused.  MAKE SURE YOU:  Understand these instructions.  Will watch your condition.  Will get help if you are not doing well or get worse. Document Released:  07/18/2004 Document Revised: 01/05/2014 Document Reviewed: 12/11/2011 [facility] Patient Information 2015 [place], [place]. This information is not intended to replace advice given to you by your health care provider. Make sure you discuss any questions you have with your health care provider.   Please call Dr. Glo Herring for an office visit for further evaluation of your pain if it persists.  You may need an ultrasound to further evaluate for this suspected ovarian cyst and is best completed by his office.  In the interim,  Use the medicines for pain control.  A heating pad can also help relieve discomfort.

## 2014-07-28 ENCOUNTER — Ambulatory Visit (INDEPENDENT_AMBULATORY_CARE_PROVIDER_SITE_OTHER): Payer: 59 | Admitting: Obstetrics & Gynecology

## 2014-07-28 ENCOUNTER — Encounter: Payer: Self-pay | Admitting: Obstetrics & Gynecology

## 2014-07-28 VITALS — BP 120/70 | Ht 67.0 in | Wt 235.0 lb

## 2014-07-28 DIAGNOSIS — R102 Pelvic and perineal pain: Secondary | ICD-10-CM | POA: Diagnosis not present

## 2014-07-29 ENCOUNTER — Telehealth: Payer: Self-pay | Admitting: Obstetrics & Gynecology

## 2014-07-29 NOTE — Telephone Encounter (Signed)
Not really anything else to do except keep appt as scheduled, this is not really a gyn problem musculoskeletal

## 2014-07-29 NOTE — Telephone Encounter (Signed)
Pt states that she was given a shot yesterday and it worked for a little while but now it's throbbing again. I asked Dr.Eure before calling the pt back and he advised for the pt to keep her appointment and that it's not GYN related.   I advised the pt of this, the pt verbalized understanding.

## 2014-08-11 ENCOUNTER — Ambulatory Visit: Payer: PRIVATE HEALTH INSURANCE | Admitting: Obstetrics & Gynecology

## 2014-08-11 ENCOUNTER — Encounter: Payer: Self-pay | Admitting: Obstetrics & Gynecology

## 2014-10-03 ENCOUNTER — Encounter (HOSPITAL_COMMUNITY): Payer: Self-pay | Admitting: Emergency Medicine

## 2014-10-03 ENCOUNTER — Emergency Department (HOSPITAL_COMMUNITY): Payer: Self-pay

## 2014-10-03 ENCOUNTER — Emergency Department (HOSPITAL_COMMUNITY)
Admission: EM | Admit: 2014-10-03 | Discharge: 2014-10-03 | Disposition: A | Discharge status: Home / Self Care | Payer: Self-pay | Attending: Emergency Medicine | Admitting: Emergency Medicine

## 2014-10-03 DIAGNOSIS — R52 Pain, unspecified: Secondary | ICD-10-CM

## 2014-10-03 DIAGNOSIS — M791 Myalgia, unspecified site: Secondary | ICD-10-CM

## 2014-10-03 DIAGNOSIS — Z8679 Personal history of other diseases of the circulatory system: Secondary | ICD-10-CM | POA: Insufficient documentation

## 2014-10-03 DIAGNOSIS — Z79899 Other long term (current) drug therapy: Secondary | ICD-10-CM | POA: Insufficient documentation

## 2014-10-03 DIAGNOSIS — Z720 Tobacco use: Secondary | ICD-10-CM | POA: Insufficient documentation

## 2014-10-03 DIAGNOSIS — Z862 Personal history of diseases of the blood and blood-forming organs and certain disorders involving the immune mechanism: Secondary | ICD-10-CM | POA: Insufficient documentation

## 2014-10-03 DIAGNOSIS — M546 Pain in thoracic spine: Secondary | ICD-10-CM | POA: Insufficient documentation

## 2014-10-03 DIAGNOSIS — M545 Low back pain: Secondary | ICD-10-CM | POA: Insufficient documentation

## 2014-10-03 DIAGNOSIS — Z791 Long term (current) use of non-steroidal anti-inflammatories (NSAID): Secondary | ICD-10-CM | POA: Insufficient documentation

## 2014-10-03 LAB — CBC WITH DIFFERENTIAL/PLATELET
Basophils Absolute: 0 10*3/uL (ref 0.0–0.1)
Basophils Relative: 0 % (ref 0–1)
Eosinophils Absolute: 0.2 10*3/uL (ref 0.0–0.7)
Eosinophils Relative: 2 % (ref 0–5)
HCT: 39.6 % (ref 36.0–46.0)
Hemoglobin: 13.3 g/dL (ref 12.0–15.0)
Lymphocytes Relative: 34 % (ref 12–46)
Lymphs Abs: 3.2 10*3/uL (ref 0.7–4.0)
MCH: 28.9 pg (ref 26.0–34.0)
MCHC: 33.6 g/dL (ref 30.0–36.0)
MCV: 85.9 fL (ref 78.0–100.0)
Monocytes Absolute: 0.6 10*3/uL (ref 0.1–1.0)
Monocytes Relative: 7 % (ref 3–12)
Neutro Abs: 5.3 10*3/uL (ref 1.7–7.7)
Neutrophils Relative %: 56 % (ref 43–77)
Platelets: 291 10*3/uL (ref 150–400)
RBC: 4.61 MIL/uL (ref 3.87–5.11)
RDW: 15.5 % (ref 11.5–15.5)
WBC: 9.4 10*3/uL (ref 4.0–10.5)

## 2014-10-03 LAB — COMPREHENSIVE METABOLIC PANEL
ALT: 26 U/L (ref 0–35)
AST: 28 U/L (ref 0–37)
Albumin: 4 g/dL (ref 3.5–5.2)
Alkaline Phosphatase: 78 U/L (ref 39–117)
Anion gap: 4 — ABNORMAL LOW (ref 5–15)
BUN: 8 mg/dL (ref 6–23)
CO2: 22 mmol/L (ref 19–32)
Calcium: 8.7 mg/dL (ref 8.4–10.5)
Chloride: 108 mmol/L (ref 96–112)
Creatinine, Ser: 0.51 mg/dL (ref 0.50–1.10)
GFR calc Af Amer: >90 mL/min (ref >90)
GFR calc non Af Amer: >90 mL/min (ref >90)
Glucose, Bld: 111 mg/dL — ABNORMAL HIGH (ref 70–99)
Potassium: 3.7 mmol/L (ref 3.5–5.1)
Sodium: 134 mmol/L — ABNORMAL LOW (ref 135–145)
Total Bilirubin: 0.3 mg/dL (ref 0.3–1.2)
Total Protein: 7 g/dL (ref 6.0–8.3)

## 2014-10-03 IMAGING — DX DG CERVICAL SPINE COMPLETE 4+V
6 series · 6 of 6 positions shown · non-contrast
Comparison: None.

CLINICAL DATA: Bilateral upper extremity numbness. No known injury.
Initial encounter.

EXAM:
CERVICAL SPINE  4+ VIEWS

[c-spine obl (1 of 2)]
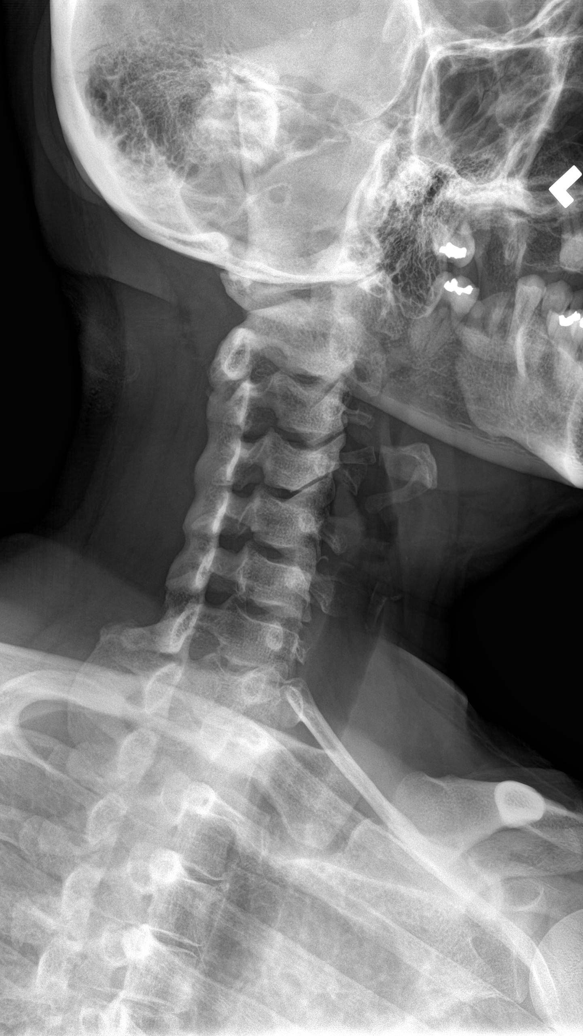

[c-spine obl (2 of 2)]
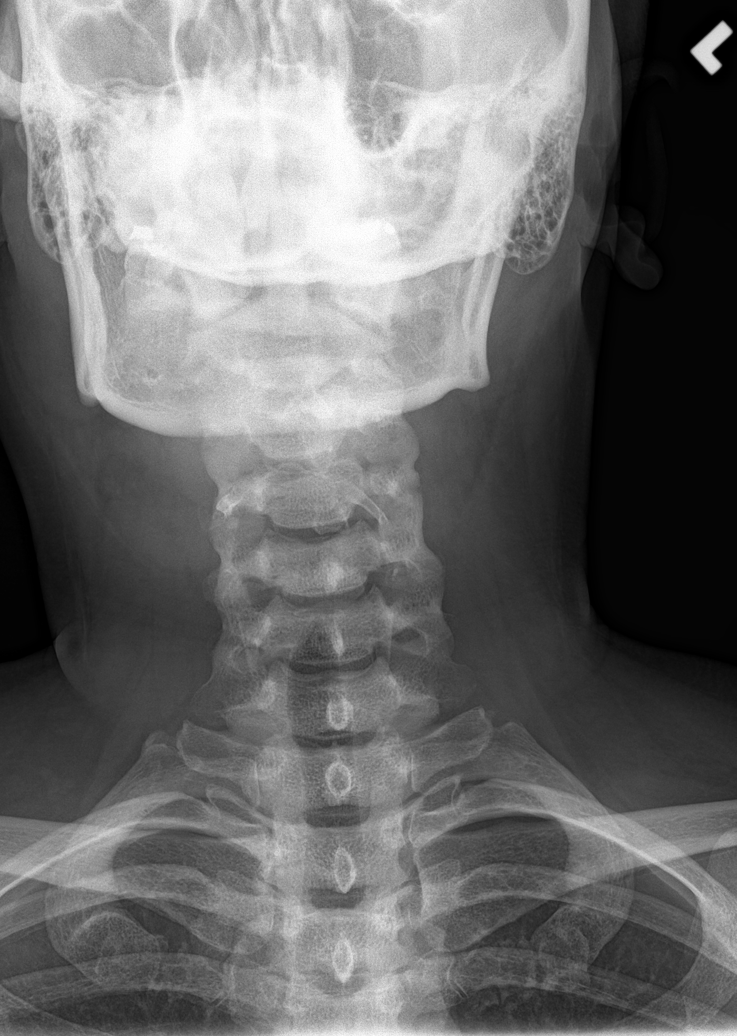

[c-spine ap]
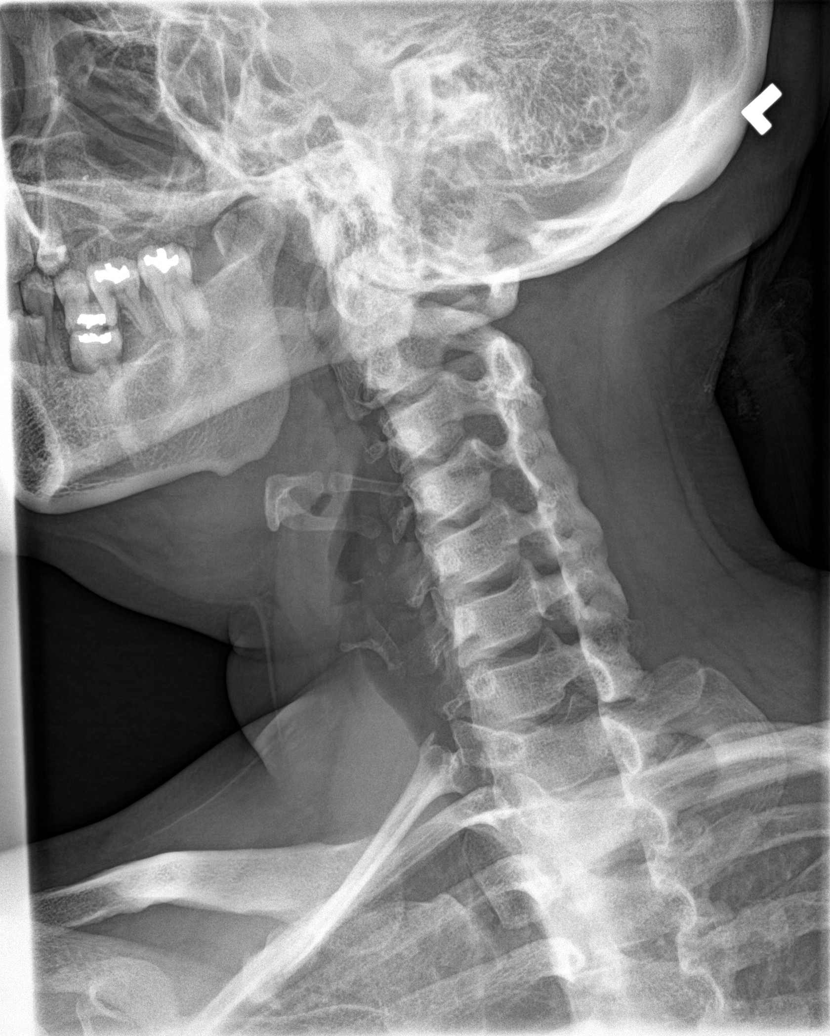

[c-spine open mouth]
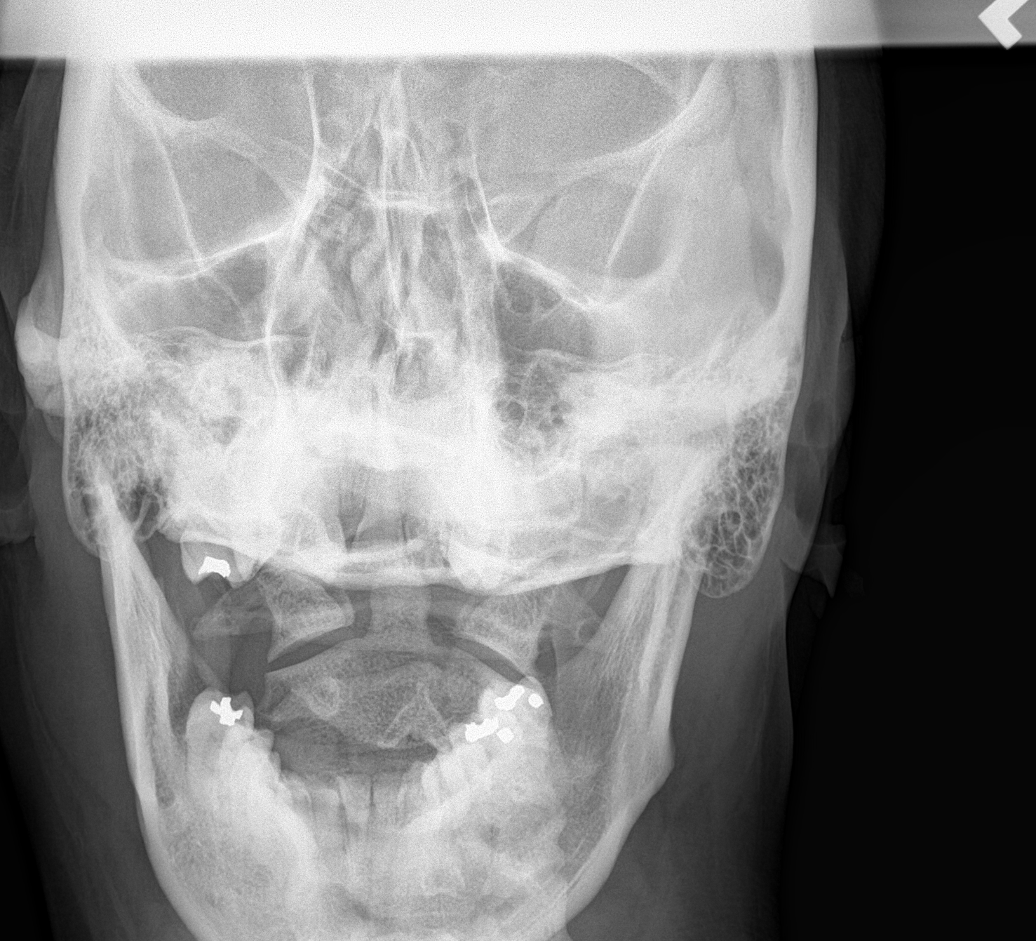

[c-spine swimmers]
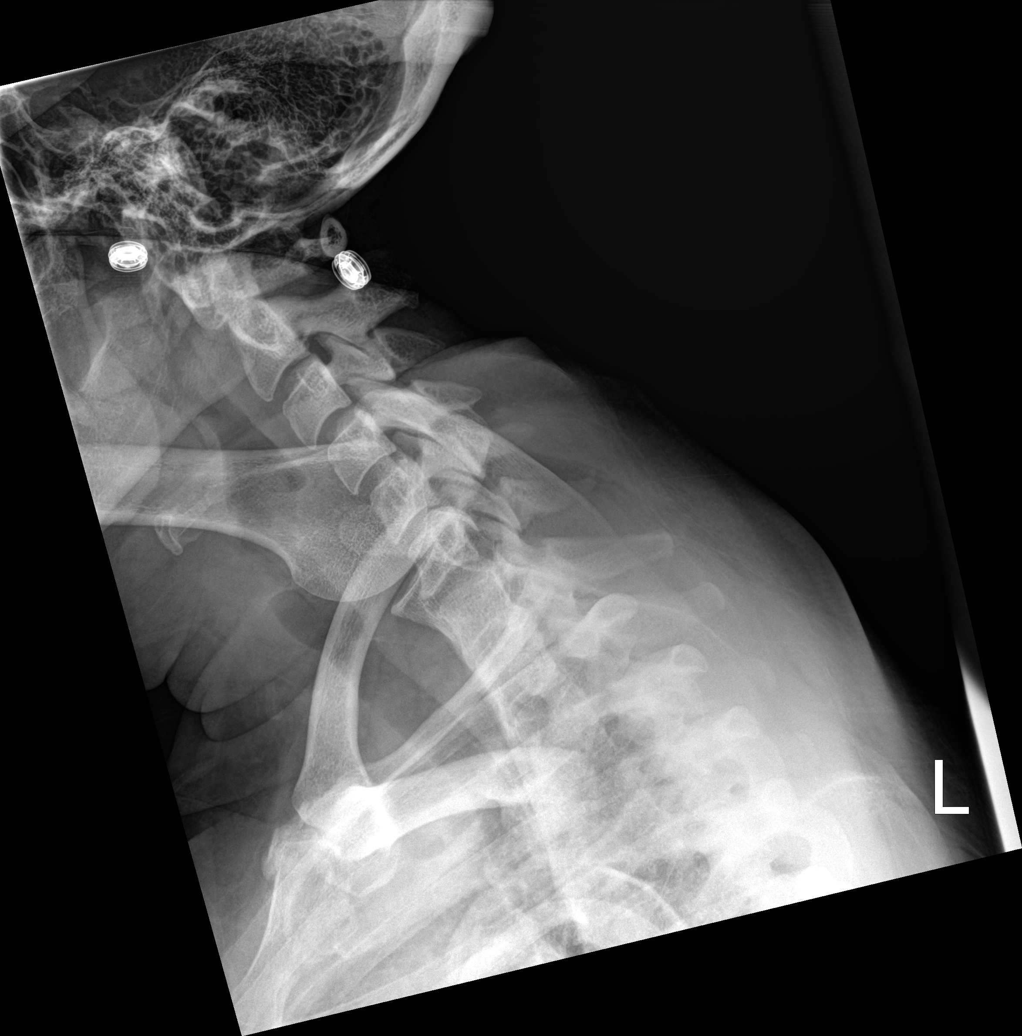

[c-spine lat]
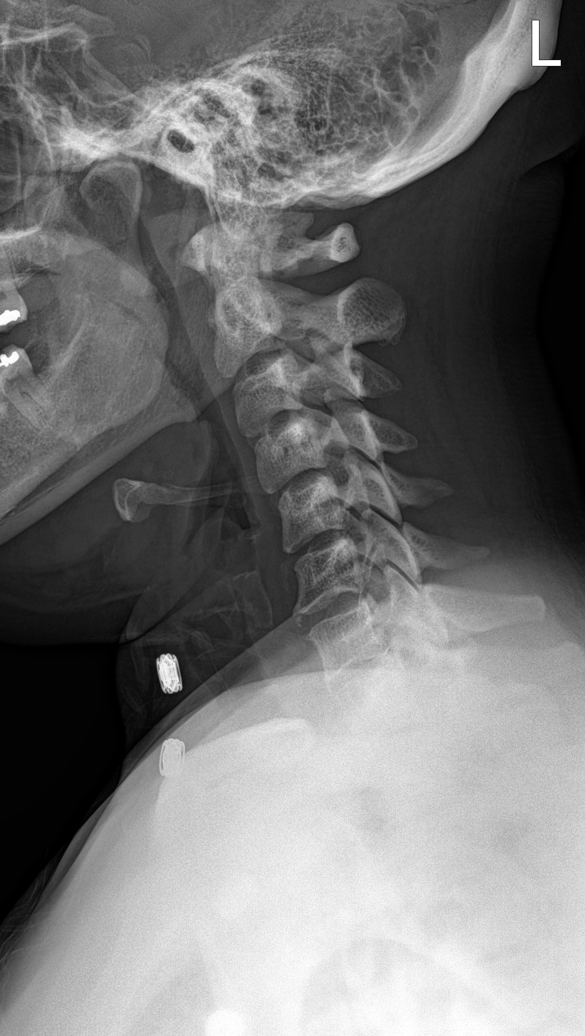

[6 of 6 positions shown; findings below may reference images not displayed]

FINDINGS: There is no evidence of cervical spine fracture or prevertebral soft
tissue swelling. Alignment is normal. No other significant bone
abnormalities are identified. No significant neural foraminal
stenosis is noted.
IMPRESSION: Negative cervical spine radiographs.

## 2014-10-03 IMAGING — DX DG LUMBAR SPINE COMPLETE 4+V
5 series · 5 of 5 positions shown · non-contrast
Comparison: CT [DATE]

CLINICAL DATA: Numbness in both arms, lower back pain, no injury,
pain x 2-3 days

EXAM:
LUMBAR SPINE - COMPLETE 4+ VIEW

[l-spine ap]
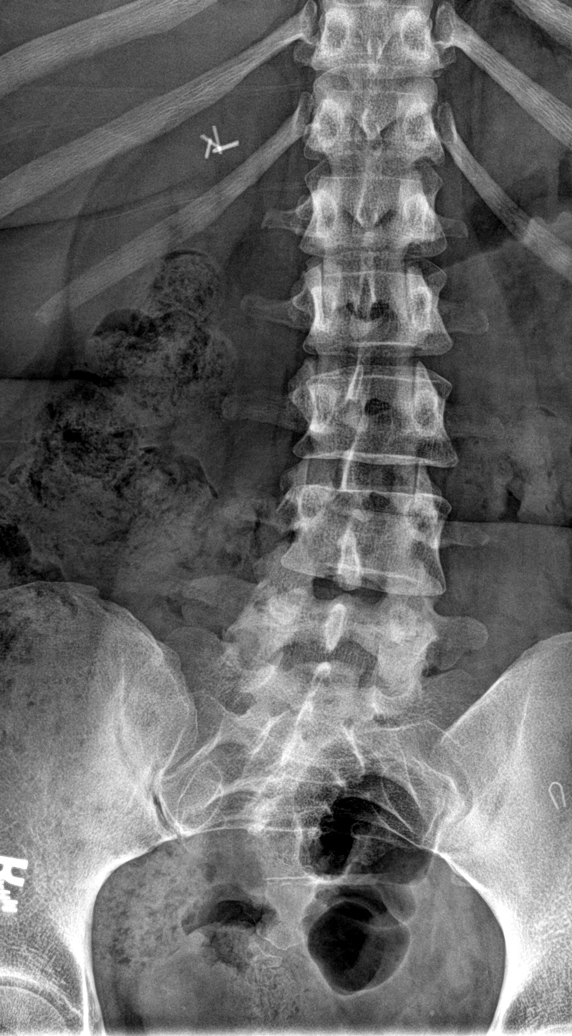

[l-spine obl (1 of 2)]
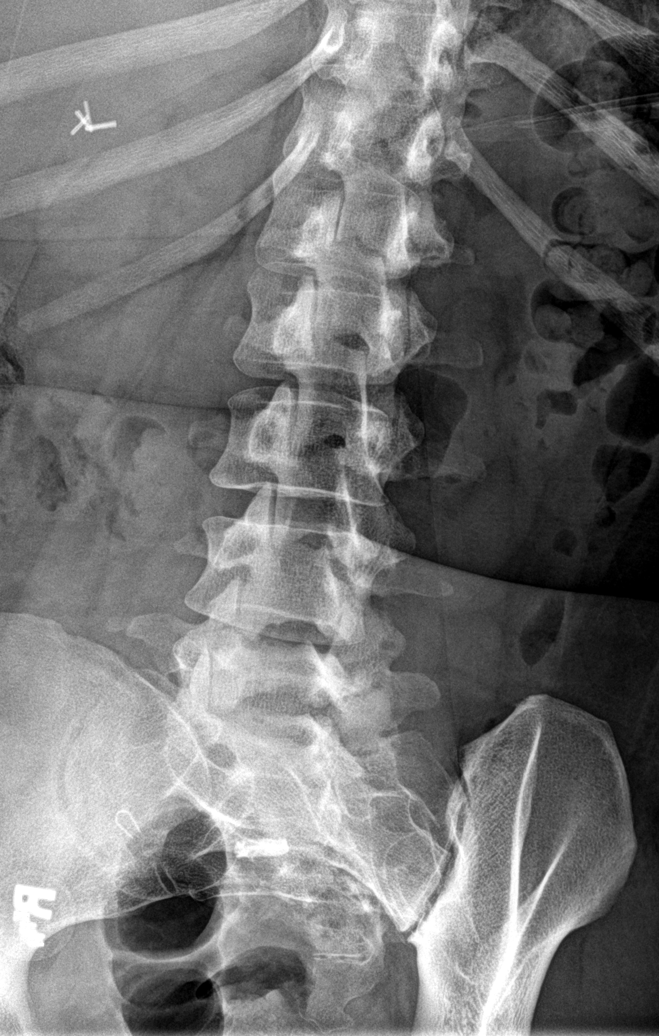

[l-spine obl (2 of 2)]
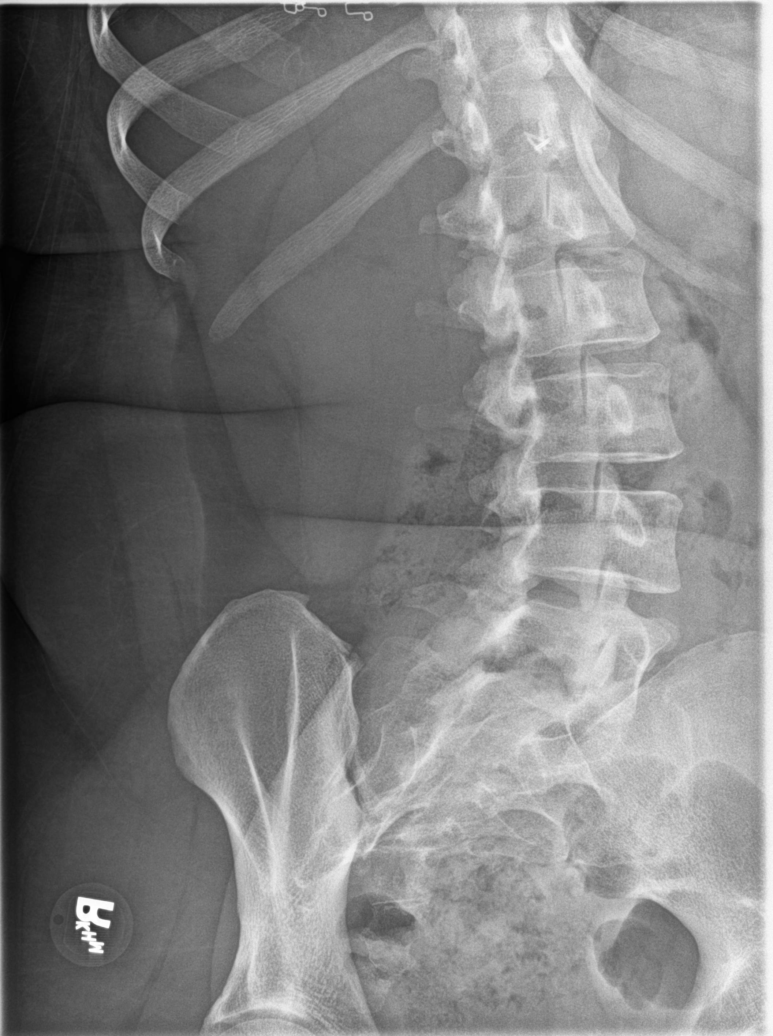

[l-spine lat]
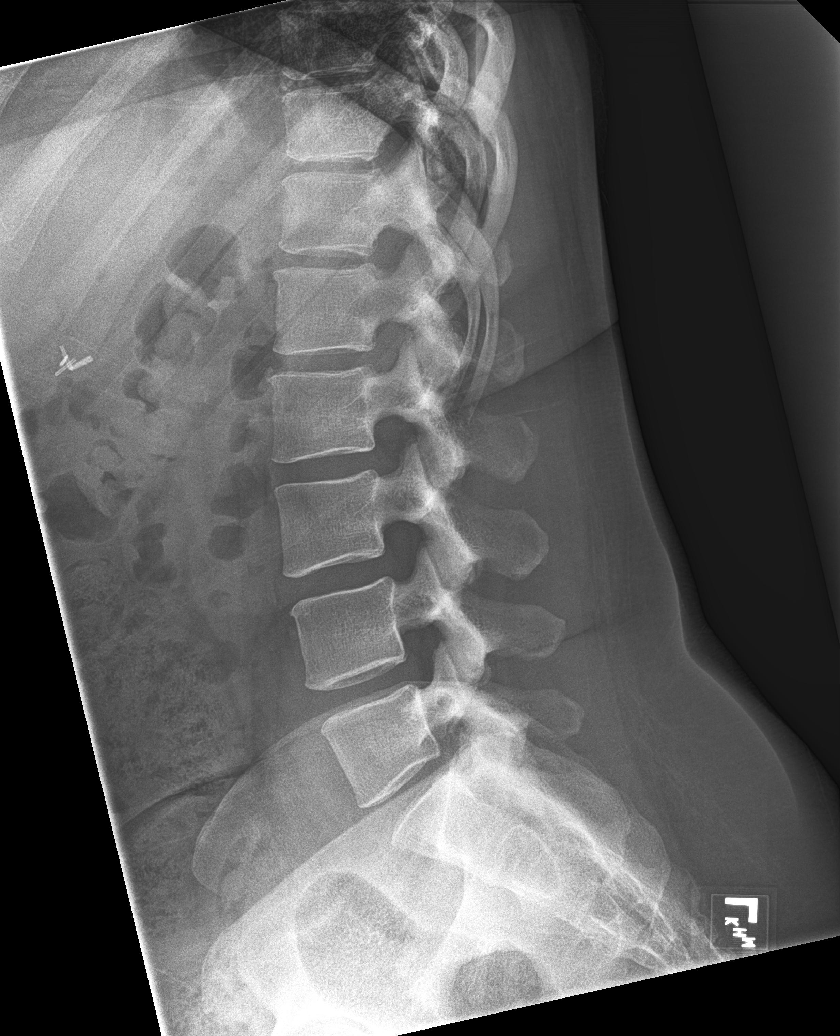

[l-spine spot]
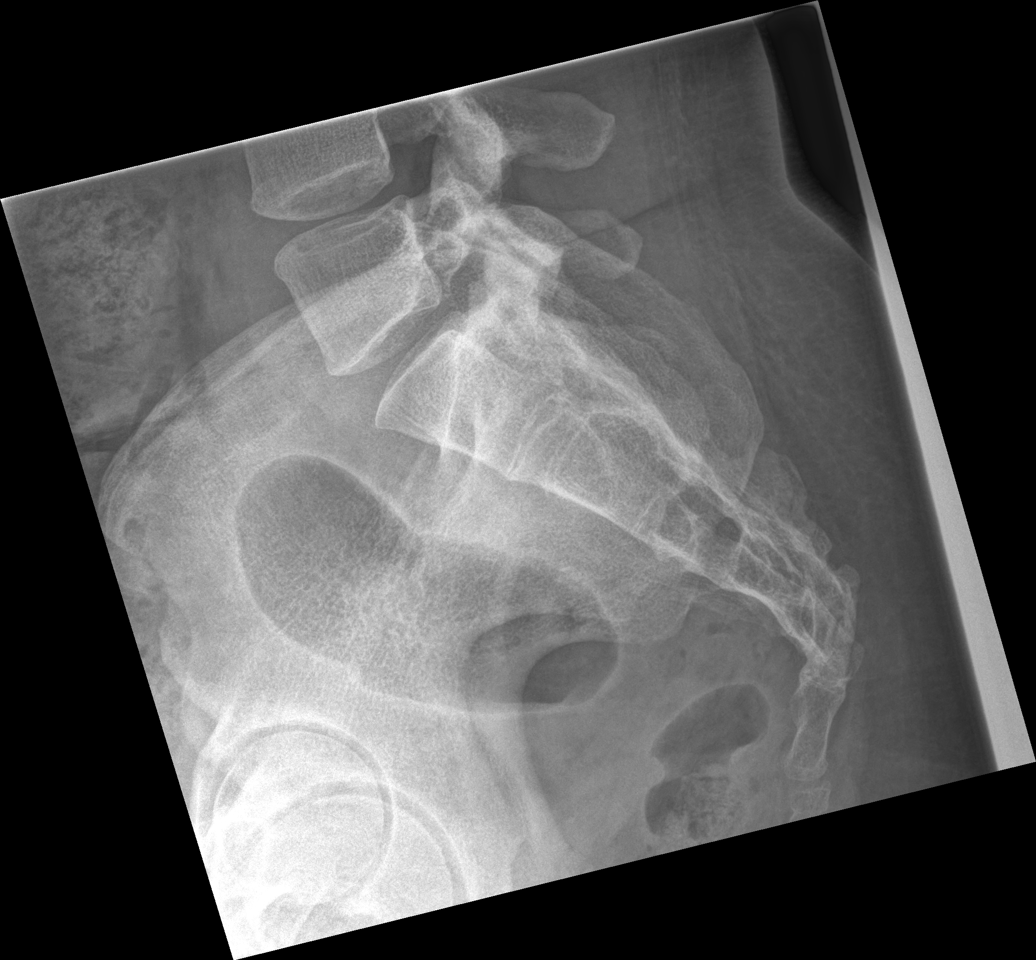

[5 of 5 positions shown; findings below may reference images not displayed]

FINDINGS: There is no evidence of lumbar spine fracture. Stable mild
dextroscoliosis apex L4 without evident underlying vertebral
anomaly. Intervertebral disc spaces are maintained. Surgical clips
right upper abdomen.
IMPRESSION: No acute abnormality.

## 2014-10-03 MED ORDER — NAPROXEN 500 MG PO TABS: 500.0000 mg | ORAL_TABLET | Freq: Two times a day (BID) | ORAL | Status: DC

## 2014-10-03 MED ORDER — HYDROCODONE-ACETAMINOPHEN 5-325 MG PO TABS
1.0000 | ORAL_TABLET | Freq: Once | ORAL | Status: AC
  Administered 2014-10-03: 1 via ORAL
  Filled 2014-10-03: qty 1

## 2014-10-03 MED ORDER — HYDROCODONE-ACETAMINOPHEN 5-325 MG PO TABS: 1.0000 | ORAL_TABLET | Freq: Four times a day (QID) | ORAL | Status: DC | PRN

## 2014-10-03 NOTE — Discharge Instructions (Signed)
Follow up with your family md in 2 weeks for recheck

## 2014-10-03 NOTE — ED Notes (Signed)
Patient with no complaints at this time. Respirations even and unlabored. Skin warm/dry. Discharge instructions reviewed with patient at this time. Patient given opportunity to voice concerns/ask questions. Patient discharged at this time and left Emergency Department with steady gait.   

## 2014-10-03 NOTE — ED Provider Notes (Signed)
CSN: [id number]     Arrival date & time 10/03/14  1025 History  This chart was scribed for Maudry Diego, MD by Peyton Bottoms, ED Scribe. This patient was seen in room APA11/APA11 and the patient's care was started at 8:51 AM.   Chief Complaint  Patient presents with  . Numbness   Patient is a 39 y.o. female presenting with extremity weakness. The history is provided by the patient (pt. complains of pain, numbness and tingling to right arm). No language interpreter was used.  Extremity Weakness This is a new problem. The current episode started 2 days ago. The problem occurs constantly. The problem has been gradually worsening. Pertinent negatives include no chest pain, no abdominal pain and no headaches. Nothing aggravates the symptoms. Nothing relieves the symptoms. She has tried nothing for the symptoms.   HPI Comments: Virginia Douglas is a 39 y.o. female who presents to the Emergency Department complaining of right arm numbness and tingling that began 2 days ago. She reports associated onset of pain in right arm that began earlier today. She states it hurts to raise her arm. She also reports associated decreased sensation in both right and left hands. Patient states she works in [employer] heavy objects. She also reports work related upper back pain. She denies associated fall, LOC or head impact.  Past Medical History  Diagnosis Date  . Edema   . Migraine headache   . Anemia     iron deficiency   Past Surgical History  Procedure Laterality Date  . Abdominal hysterectomy    . Cholecystectomy     Family History  Problem Relation Age of Onset  . Diabetes Mother   . Hypertension Mother   . Diabetes Father   . Hypertension Father   . Hypertension Brother   . Cancer Maternal Aunt     breast  . Diabetes Maternal Grandmother   . Diabetes Paternal Grandmother    History  Substance Use Topics  . Smoking status: Current Every Day Smoker -- 0.50 packs/day     Types: Cigarettes  . Smokeless tobacco: Not on file  . Alcohol Use: No   OB History    No data available     Review of Systems  Constitutional: Negative for appetite change and fatigue.  HENT: Negative for congestion, ear discharge and sinus pressure.   Eyes: Negative for discharge.  Respiratory: Negative for cough.   Cardiovascular: Negative for chest pain.  Gastrointestinal: Negative for abdominal pain and diarrhea.  Genitourinary: Negative for frequency and hematuria.  Musculoskeletal: Positive for back pain and extremity weakness.  Skin: Negative for rash.  Neurological: Positive for numbness. Negative for seizures and headaches.  Psychiatric/Behavioral: Negative for hallucinations.   Allergies  Review of patient's allergies indicates no active allergies.  Home Medications   Prior to Admission medications   Medication Sig Start Date End Date Taking? Authorizing Provider  albuterol (PROVENTIL HFA;VENTOLIN HFA) 108 (90 BASE) MCG/ACT inhaler Inhale 2 puffs into the lungs every 4 (four) hours as needed for wheezing or shortness of breath. 09/26/13   Francine Graven, DO  hydrochlorothiazide (HYDRODIURIL) 12.5 MG tablet Take 1 tablet (12.5 mg total) by mouth daily. Patient not taking: Reported on 07/28/2014 02/12/13   Camelia Eng Tysinger, PA-C  ibuprofen (ADVIL,MOTRIN) 600 MG tablet Take 1 tablet (600 mg total) by mouth every 6 (six) hours as needed. 11/26/13   Merryl Hacker, MD  naproxen (NAPROSYN) 500 MG tablet Take 1 tablet (500 mg total) by  mouth 2 (two) times daily. 07/27/14   Evalee Jefferson, PA-C  ondansetron (ZOFRAN) 4 MG tablet Take 1 tablet (4 mg total) by mouth every 6 (six) hours. Patient not taking: Reported on 07/28/2014 11/26/13   Merryl Hacker, MD  oxyCODONE-acetaminophen (PERCOCET/ROXICET) 5-325 MG per tablet Take 1-2 tablets by mouth every 4 (four) hours as needed. 07/27/14   Evalee Jefferson, PA-C  pantoprazole (PROTONIX) 20 MG tablet Take 1 tablet (20 mg total) by mouth  daily. 04/30/13   Camelia Eng Tysinger, PA-C  phentermine 37.5 MG capsule Take 1 capsule (37.5 mg total) by mouth every morning. Patient not taking: Reported on 07/28/2014 08/08/13   Camelia Eng Tysinger, PA-C  potassium chloride (K-DUR,KLOR-CON) 10 MEQ tablet Take 1 tablet (10 mEq total) by mouth daily. Patient not taking: Reported on 07/28/2014 03/17/13   Camelia Eng Tysinger, PA-C  Pseudoeph-Doxylamine-DM-APAP (NYQUIL PO) Take 2 capsules by mouth at bedtime as needed and may repeat dose one time if needed (flu-like symptoms.).    Historical Provider, MD  topiramate (TOPAMAX) 25 MG tablet Take 1 tablet (25 mg total) by mouth daily. Patient not taking: Reported on 07/28/2014 12/08/13   Camelia Eng Tysinger, PA-C   Triage Vitals: BP 124/78 mmHg  Pulse 83  Temp(Src) 98.2 F (36.8 C) (Oral)  Resp 18  Ht 5' 6.5" (1.689 m)  Wt 220 lb (99.791 kg)  BMI 34.98 kg/m2  SpO2 98%  Physical Exam  Constitutional: She is oriented to person, place, and time. She appears well-developed.  HENT:  Head: Normocephalic.  Eyes: Conjunctivae are normal.  Neck: No tracheal deviation present.  Cardiovascular:  No murmur heard. Musculoskeletal: Normal range of motion. She exhibits tenderness. She exhibits no edema.  Tenderness to thoracic and lumbar spine. Decreased sensation to right forearm.  Neurological: She is oriented to person, place, and time.  Skin: Skin is warm.  Psychiatric: She has a normal mood and affect.   ED Course  Procedures (including critical care time)  DIAGNOSTIC STUDIES: Oxygen Saturation is 98% on RA, normal by my interpretation.    COORDINATION OF CARE: 8:56 AM- Discussed plans to order diagnostic imaging and lab work. Pt advised of plan for treatment and pt agrees.  Labs Review Labs Reviewed - No data to display  Imaging Review No results found.   EKG Interpretation None     MDM   Final diagnoses:  None    Myalgias,  tx with naprosyn,  Hydrocodone and follow up with pcp  I  personally performed the services described in this documentation, which was scribed in my presence. The recorded information has been reviewed and is accurate.  Maudry Diego, MD 10/03/14 1136

## 2014-10-03 NOTE — ED Notes (Signed)
Pt reports neck pain,numbness/tingling sensation in left arm x2 days. Pt reports pain started in left arm and discomfort began in right arm today. Pt denies any injury/fall.

## 2014-10-12 ENCOUNTER — Telehealth: Payer: Self-pay | Admitting: Family Medicine

## 2014-10-12 NOTE — Telephone Encounter (Signed)
ER letter sent 

## 2014-11-05 NOTE — Progress Notes (Signed)
Patient ID: Virginia Douglas, female   DOB: 1975/11/08, 39 y.o.   MRN: [id number]  Chief Complaint  Patient presents with  . follow-up ER    ovarian cyst.    Pt presents for follow up from the ER for pelvic pain, sudden sharp and quickly improving No imaging done, presumptive ovarian cyst  Blood pressure 120/70, height 5\' 7"  (1.702 m), weight 235 lb (106.595 kg).  GEN WDWN female NAD Abdomen soft no gaurding no rebound non tender  Questionable right ovarian cyst: seems to be resolved may be ruptured follow up if recurs

## 2015-04-16 ENCOUNTER — Emergency Department (HOSPITAL_COMMUNITY)
Admission: EM | Admit: 2015-04-16 | Discharge: 2015-04-16 | Disposition: A | Discharge status: Home / Self Care | Payer: BLUE CROSS/BLUE SHIELD | Attending: Emergency Medicine | Admitting: Emergency Medicine

## 2015-04-16 ENCOUNTER — Encounter (HOSPITAL_COMMUNITY): Payer: Self-pay | Admitting: Emergency Medicine

## 2015-04-16 DIAGNOSIS — Z862 Personal history of diseases of the blood and blood-forming organs and certain disorders involving the immune mechanism: Secondary | ICD-10-CM | POA: Insufficient documentation

## 2015-04-16 DIAGNOSIS — Z79899 Other long term (current) drug therapy: Secondary | ICD-10-CM | POA: Diagnosis not present

## 2015-04-16 DIAGNOSIS — Z720 Tobacco use: Secondary | ICD-10-CM | POA: Insufficient documentation

## 2015-04-16 DIAGNOSIS — G43009 Migraine without aura, not intractable, without status migrainosus: Secondary | ICD-10-CM

## 2015-04-16 DIAGNOSIS — Z791 Long term (current) use of non-steroidal anti-inflammatories (NSAID): Secondary | ICD-10-CM | POA: Diagnosis not present

## 2015-04-16 DIAGNOSIS — G43909 Migraine, unspecified, not intractable, without status migrainosus: Secondary | ICD-10-CM | POA: Diagnosis not present

## 2015-04-16 MED ORDER — SODIUM CHLORIDE 0.9 % IV BOLUS (SEPSIS)
1000.0000 mL | Freq: Once | INTRAVENOUS | Status: AC
  Administered 2015-04-16: 1000 mL via INTRAVENOUS

## 2015-04-16 MED ORDER — METOCLOPRAMIDE HCL 5 MG/ML IJ SOLN
10.0000 mg | Freq: Once | INTRAMUSCULAR | Status: AC
  Administered 2015-04-16: 10 mg via INTRAVENOUS
  Filled 2015-04-16: qty 2

## 2015-04-16 MED ORDER — KETOROLAC TROMETHAMINE 30 MG/ML IJ SOLN
30.0000 mg | Freq: Once | INTRAMUSCULAR | Status: AC
  Administered 2015-04-16: 30 mg via INTRAVENOUS
  Filled 2015-04-16: qty 1

## 2015-04-16 MED ORDER — DIPHENHYDRAMINE HCL 50 MG/ML IJ SOLN
50.0000 mg | Freq: Once | INTRAMUSCULAR | Status: AC
  Administered 2015-04-16: 50 mg via INTRAVENOUS
  Filled 2015-04-16: qty 1

## 2015-04-16 MED ORDER — DEXAMETHASONE SODIUM PHOSPHATE 10 MG/ML IJ SOLN
10.0000 mg | Freq: Once | INTRAMUSCULAR | Status: AC
  Administered 2015-04-16: 10 mg via INTRAVENOUS
  Filled 2015-04-16: qty 1

## 2015-04-16 NOTE — Discharge Instructions (Signed)

## 2015-04-16 NOTE — ED Notes (Signed)
Pt states understanding of care given and follow up instructions 

## 2015-04-16 NOTE — ED Notes (Signed)
Patient complaining of headache for approximately a week and a half. Denies nausea or vomiting. Reports sensitivity to light. States history of migraines.

## 2015-04-16 NOTE — ED Provider Notes (Signed)
TIME SEEN: 3:10 AM  CHIEF COMPLAINT: Migraine  HPI: Pt is a 39 y.o. with history of migraine headaches who presents emergency department with a week and a half of throbbing, diffuse headache without radiation that feels similar to her prior migraines. Described as moderate to severe. States that she's been taking ibuprofen and Aleve without any relief. States she normally needs a "migraine cocktail" whenever she has her headache this bad. Denies fever, neck pain or neck stiffness. No head injury. Not on anticoagulation. Denies numbness, tingling or focal weakness. States headache is worse with lights. No nausea or vomiting.   ROS: See HPI Constitutional: no fever  Eyes: no drainage  ENT: no runny nose   Cardiovascular:  no chest pain  Resp: no SOB  GI: no vomiting GU: no dysuria Integumentary: no rash  Allergy: no hives  Musculoskeletal: no leg swelling  Neurological: no slurred speech ROS otherwise negative  PAST MEDICAL HISTORY/PAST SURGICAL HISTORY:  Past Medical History  Diagnosis Date  . Edema   . Migraine headache   . Anemia     iron deficiency    MEDICATIONS:  Prior to Admission medications   Medication Sig Start Date End Date Taking? Authorizing Provider  albuterol (PROVENTIL HFA;VENTOLIN HFA) 108 (90 BASE) MCG/ACT inhaler Inhale 2 puffs into the lungs every 4 (four) hours as needed for wheezing or shortness of breath. Patient not taking: Reported on 10/03/2014 09/26/13   Francine Graven, DO  hydrochlorothiazide (HYDRODIURIL) 12.5 MG tablet Take 1 tablet (12.5 mg total) by mouth daily. Patient not taking: Reported on 07/28/2014 02/12/13   Camelia Eng Tysinger, PA-C  HYDROcodone-acetaminophen (NORCO/VICODIN) 5-325 MG per tablet Take 1 tablet by mouth every 6 (six) hours as needed. 10/03/14   Milton Ferguson, MD  ibuprofen (ADVIL,MOTRIN) 600 MG tablet Take 1 tablet (600 mg total) by mouth every 6 (six) hours as needed. Patient not taking: Reported on 10/03/2014 11/26/13   Merryl Hacker, MD  naproxen (NAPROSYN) 500 MG tablet Take 1 tablet (500 mg total) by mouth 2 (two) times daily. 10/03/14   Milton Ferguson, MD  ondansetron (ZOFRAN) 4 MG tablet Take 1 tablet (4 mg total) by mouth every 6 (six) hours. Patient not taking: Reported on 07/28/2014 11/26/13   Merryl Hacker, MD  oxyCODONE-acetaminophen (PERCOCET/ROXICET) 5-325 MG per tablet Take 1-2 tablets by mouth every 4 (four) hours as needed. Patient not taking: Reported on 10/03/2014 07/27/14   Evalee Jefferson, PA-C  pantoprazole (PROTONIX) 20 MG tablet Take 1 tablet (20 mg total) by mouth daily. Patient not taking: Reported on 10/03/2014 04/30/13   Camelia Eng Tysinger, PA-C  phentermine 37.5 MG capsule Take 1 capsule (37.5 mg total) by mouth every morning. Patient not taking: Reported on 07/28/2014 08/08/13   Camelia Eng Tysinger, PA-C  potassium chloride (K-DUR,KLOR-CON) 10 MEQ tablet Take 1 tablet (10 mEq total) by mouth daily. Patient not taking: Reported on 07/28/2014 03/17/13   Camelia Eng Tysinger, PA-C  Pseudoeph-Doxylamine-DM-APAP (NYQUIL PO) Take 2 capsules by mouth at bedtime as needed and may repeat dose one time if needed (flu-like symptoms.).    Historical Provider, MD  topiramate (TOPAMAX) 25 MG tablet Take 1 tablet (25 mg total) by mouth daily. Patient not taking: Reported on 07/28/2014 12/08/13   Carlena Hurl, PA-C    ALLERGIES:  No Known Allergies  SOCIAL HISTORY:  Social History  Substance Use Topics  . Smoking status: Current Every Day Smoker -- 0.50 packs/day    Types: Cigarettes  . Smokeless tobacco: Not  on file  . Alcohol Use: No    FAMILY HISTORY: Family History  Problem Relation Age of Onset  . Diabetes Mother   . Hypertension Mother   . Diabetes Father   . Hypertension Father   . Hypertension Brother   . Cancer Maternal Aunt     breast  . Diabetes Maternal Grandmother   . Diabetes Paternal Grandmother     EXAM: BP 121/77 mmHg  Pulse 88  Temp(Src) 97.7 F (36.5 C) (Oral)  Resp 20  Ht 5'  7" (1.702 m)  Wt 201 lb (91.173 kg)  BMI 31.47 kg/m2  SpO2 99% CONSTITUTIONAL: Alert and oriented and responds appropriately to questions. Well-appearing; well-nourished HEAD: Normocephalic EYES: Conjunctivae clear, PERRL, patient has photophobia ENT: normal nose; no rhinorrhea; moist mucous membranes; pharynx without lesions noted NECK: Supple, no meningismus, no LAD  CARD: RRR; S1 and S2 appreciated; no murmurs, no clicks, no rubs, no gallops RESP: Normal chest excursion without splinting or tachypnea; breath sounds clear and equal bilaterally; no wheezes, no rhonchi, no rales, no hypoxia or respiratory distress, speaking full sentences ABD/GI: Normal bowel sounds; non-distended; soft, non-tender, no rebound, no guarding, no peritoneal signs BACK:  The back appears normal and is non-tender to palpation, there is no CVA tenderness EXT: Normal ROM in all joints; non-tender to palpation; no edema; normal capillary refill; no cyanosis, no calf tenderness or swelling    SKIN: Normal color for age and race; warm NEURO: Moves all extremities equally, sensation to light touch intact diffusely, cranial nerves II through XII intact PSYCH: The patient's mood and manner are appropriate. Grooming and personal hygiene are appropriate.  MEDICAL DECISION MAKING: Patient here with her typical migraine headache. Hemodynamically stable and neurologically intact. Headache completely resolved after Toradol, Reglan, Benadryl, Decadron and IV fluids. Have provided her with outpatient neurology follow-up information. Discussed return precautions. I do not feel she needs emergent head imaging given she has had the same migraines many times in the past and is afebrile, nontoxic and neurologically intact. She verbalized understanding and is comfortable with this plan.        Walker, DO 04/16/15 [phone]

## 2015-04-16 NOTE — ED Notes (Signed)
Pt states that she was on preventative medications in the past but has not been able to get the scripts filled in a long while due to loss of insurance

## 2015-05-06 ENCOUNTER — Encounter (HOSPITAL_COMMUNITY): Payer: Self-pay | Admitting: Emergency Medicine

## 2015-05-06 ENCOUNTER — Emergency Department (HOSPITAL_COMMUNITY)
Admission: EM | Admit: 2015-05-06 | Discharge: 2015-05-06 | Disposition: A | Discharge status: Home / Self Care | Payer: BLUE CROSS/BLUE SHIELD | Attending: Emergency Medicine | Admitting: Emergency Medicine

## 2015-05-06 DIAGNOSIS — Z720 Tobacco use: Secondary | ICD-10-CM | POA: Diagnosis not present

## 2015-05-06 DIAGNOSIS — Z79899 Other long term (current) drug therapy: Secondary | ICD-10-CM | POA: Diagnosis not present

## 2015-05-06 DIAGNOSIS — Z862 Personal history of diseases of the blood and blood-forming organs and certain disorders involving the immune mechanism: Secondary | ICD-10-CM | POA: Diagnosis not present

## 2015-05-06 DIAGNOSIS — Z8679 Personal history of other diseases of the circulatory system: Secondary | ICD-10-CM | POA: Insufficient documentation

## 2015-05-06 DIAGNOSIS — M7701 Medial epicondylitis, right elbow: Secondary | ICD-10-CM | POA: Insufficient documentation

## 2015-05-06 DIAGNOSIS — Z791 Long term (current) use of non-steroidal anti-inflammatories (NSAID): Secondary | ICD-10-CM | POA: Insufficient documentation

## 2015-05-06 DIAGNOSIS — M79601 Pain in right arm: Secondary | ICD-10-CM | POA: Diagnosis present

## 2015-05-06 MED ORDER — TRAMADOL HCL 50 MG PO TABS: 50.0000 mg | ORAL_TABLET | Freq: Four times a day (QID) | ORAL | Status: DC | PRN

## 2015-05-06 MED ORDER — IBUPROFEN 600 MG PO TABS: 600.0000 mg | ORAL_TABLET | Freq: Three times a day (TID) | ORAL | Status: DC | PRN

## 2015-05-06 NOTE — Discharge Instructions (Signed)
Medial Epicondylitis (Golfer's Elbow) with Rehab Medial epicondylitis involves inflammation and pain around the inner (medial) portion of the elbow. This pain is caused by inflammation of the tendons in the forearm that flex (bring down) the wrist. Medial epicondylitis is also called golfer's elbow, because it is common among golfers. However, it may occur in any individual who flexes the wrist regularly. If medial epicondylitis is left untreated, it may become a chronic problem. SYMPTOMS   Pain, tenderness, or inflammation over the inner (medial) side of the elbow.  Pain or weakness with gripping activities.  Pain that increases with wrist twisting motions (using a screwdriver, playing golf, bowling). CAUSES  Medial epicondylitis is caused by inflammation of the tendons that flex the wrist. Causes of injury may include:  Chronic, repetitive stress and strain to the tendons that run from the wrist and forearm to the elbow.  Sudden strain on the forearm, including wrist snap when serving balls with racquet sports, or throwing a baseball. RISK INCREASES WITH:  Sports or occupations that require repetitive and/or strenuous forearm and wrist movements (pitching a baseball, golfing, carpentry).  Poor wrist and forearm strength and flexibility.  Failure to warm up properly before activity.  Resuming activity before healing, rehabilitation, and conditioning are complete. PREVENTION   Warm up and stretch properly before activity.  Maintain physical fitness:  Strength, flexibility, and endurance.  Cardiovascular fitness.  Wear and use properly fitted equipment.  Learn and use proper technique and have a coach correct improper technique.  Wear a tennis elbow (counterforce) brace. PROGNOSIS  The course of this condition depends on the degree of the injury. If treated properly, acute cases (symptoms lasting less than 4 weeks) are often resolved in 2 to 6 weeks. Chronic (longer lasting  cases) often resolve in 3 to 6 months, but may require physical therapy. RELATED COMPLICATIONS   Frequently recurring symptoms, resulting in a chronic problem. Properly treating the problem the first time decreases frequency of recurrence.  Chronic inflammation, scarring, and partial tendon tear, requiring surgery.  Delayed healing or resolution of symptoms. TREATMENT  Treatment first involves the use of ice and medicine, to reduce pain and inflammation. Strengthening and stretching exercises may reduce discomfort, if performed regularly. These exercises may be performed at home, if the condition is an acute injury. Chronic cases may require a referral to a physical therapist for evaluation and treatment. Your caregiver may advise a corticosteroid injection to help reduce inflammation. Rarely, surgery is needed. MEDICATION  If pain medicine is needed, nonsteroidal anti-inflammatory medicines (aspirin and ibuprofen), or other minor pain relievers (acetaminophen), are often advised.  Do not take pain medicine for 7 days before surgery.  Prescription pain relievers may be given, if your caregiver thinks they are needed. Use only as directed and only as much as you need.  Corticosteroid injections may be recommended. These injections should be reserved only for the most severe cases, because they can only be given a certain number of times. HEAT AND COLD  Cold treatment (icing) should be applied for 10 to 15 minutes every 2 to 3 hours for inflammation and pain, and immediately after activity that aggravates your symptoms. Use ice packs or an ice massage.  Heat treatment may be used before performing stretching and strengthening activities prescribed by your caregiver, physical therapist, or athletic trainer. Use a heat pack or a warm water soak. SEEK MEDICAL CARE IF: Symptoms get worse or do not improve in 2 weeks, despite treatment. EXERCISES  RANGE OF MOTION (  ROM) AND STRETCHING EXERCISES -  Epicondylitis, Medial (Golfer's Elbow) These exercises may help you when beginning to rehabilitate your injury. Your symptoms may go away with or without further involvement from your physician, physical therapist or athletic trainer. While completing these exercises, remember:   Restoring tissue flexibility helps normal motion to return to the joints. This allows healthier, less painful movement and activity.  An effective stretch should be held for at least 30 seconds.  A stretch should never be painful. You should only feel a gentle lengthening or release in the stretched tissue. RANGE OF MOTION - Wrist Flexion, Active-Assisted  Extend your right / left elbow with your fingers pointing down.*  Gently pull the back of your hand towards you, until you feel a gentle stretch on the top of your forearm.  Hold this position for __________ seconds. Repeat __________ times. Complete this exercise __________ times per day.  *If directed by your physician, physical therapist or athletic trainer, complete this stretch with your elbow bent, rather than extended. RANGE OF MOTION - Wrist Extension, Active-Assisted  Extend your right / left elbow and turn your palm upwards.*  Gently pull your palm and fingertips back, so your wrist extends and your fingers point more toward the ground.  You should feel a gentle stretch on the inside of your forearm.  Hold this position for __________ seconds. Repeat __________ times. Complete this exercise __________ times per day. *If directed by your physician, physical therapist or athletic trainer, complete this stretch with your elbow bent, rather than extended. STRETCH - Wrist Extension   Place your right / left fingertips on a tabletop leaving your elbow slightly bent. Your fingers should point backwards.  Gently press your fingers and palm down onto the table, by straightening your elbow. You should feel a stretch on the inside of your forearm.  Hold  this position for __________ seconds. Repeat __________ times. Complete this stretch __________ times per day.  STRENGTHENING EXERCISES - Epicondylitis, Medial (Golfer's Elbow) These exercises may help you when beginning to rehabilitate your injury. They may resolve your symptoms with or without further involvement from your physician, physical therapist or athletic trainer. While completing these exercises, remember:   Muscles can gain both the endurance and the strength needed for everyday activities through controlled exercises.  Complete these exercises as instructed by your physician, physical therapist or athletic trainer. Increase the resistance and repetitions only as guided.  You may experience muscle soreness or fatigue, but the pain or discomfort you are trying to eliminate should never worsen during these exercises. If this pain does get worse, stop and make sure you are following the directions exactly. If the pain is still present after adjustments, discontinue the exercise until you can discuss the trouble with your caregiver. STRENGTH - Wrist Flexors  Sit with your right / left forearm palm-up, and fully supported on a table or countertop. Your elbow should be resting below the height of your shoulder. Allow your wrist to extend over the edge of the surface.  Loosely holding a __________ weight, or a piece of rubber exercise band or tubing, slowly curl your hand up toward your forearm.  Hold this position for __________ seconds. Slowly lower the wrist back to the starting position in a controlled manner. Repeat __________ times. Complete this exercise __________ times per day.  STRENGTH - Wrist Extensors  Sit with your right / left forearm palm-down and fully supported. Your elbow should be resting below the height of your shoulder.   Allow your wrist to extend over the edge of the surface.  Loosely holding a __________ weight, or a piece of rubber exercise band or tubing, slowly  curl your hand up toward your forearm.  Hold this position for __________ seconds. Slowly lower the wrist back to the starting position in a controlled manner. Repeat __________ times. Complete this exercise __________ times per day.  STRENGTH - Ulnar Deviators  Stand with a ____________________ weight in your right / left hand, or sit while holding a rubber exercise band or tubing, with your healthy arm supported on a table or countertop.  Move your wrist so that your pinkie travels toward your forearm and your thumb moves away from your forearm.  Hold this position for __________ seconds and then slowly lower the wrist back to the starting position. Repeat __________ times. Complete this exercise __________ times per day STRENGTH - Grip   Grasp a tennis ball, a dense sponge, or a large, rolled sock in your hand.  Squeeze as hard as you can, without increasing any pain.  Hold this position for __________ seconds. Release your grip slowly. Repeat __________ times. Complete this exercise __________ times per day.  STRENGTH - Forearm Supinators   Sit with your right / left forearm supported on a table, keeping your elbow below shoulder height. Rest your hand over the edge, palm down.  Gently grip a hammer or a soup ladle.  Without moving your elbow, slowly turn your palm and hand upward to a "thumbs-up" position.  Hold this position for __________ seconds. Slowly return to the starting position. Repeat __________ times. Complete this exercise __________ times per day.  STRENGTH - Forearm Pronators  Sit with your right / left forearm supported on a table, keeping your elbow below shoulder height. Rest your hand over the edge, palm up.  Gently grip a hammer or a soup ladle.  Without moving your elbow, slowly turn your palm and hand upward to a "thumbs-up" position.  Hold this position for __________ seconds. Slowly return to the starting position. Repeat __________ times. Complete  this exercise __________ times per day.  Document Released: 08/21/2005 Document Revised: 11/13/2011 Document Reviewed: 12/03/2008 [employer] Patient Information 2015 [employer]. This information is not intended to replace advice given to you by your health care provider. Make sure you discuss any questions you have with your health care provider.  

## 2015-05-06 NOTE — ED Notes (Signed)
Pt c/o rt arm pain x one month.

## 2015-05-06 NOTE — ED Provider Notes (Signed)
CSN: [id number]     Arrival date & time 05/06/15  0509 History   First MD Initiated Contact with Patient 05/06/15 [phone]     Chief Complaint  Patient presents with  . Arm Pain     (Consider location/radiation/quality/duration/timing/severity/associated sxs/prior Treatment) Patient is a 39 y.o. female presenting with arm pain. The history is provided by the patient.  Arm Pain This is a new problem.   patient's had pain in her right elbow for the last few days. Worse with movement. States there is some tingling at the site. Worse with movement. Worse with pushing on it. No trauma. She does work in a [employer] and does do want repetitive motion work she has to move her hand up and across her body. No other injury. No shoulder pain. No loss of strength.  Past Medical History  Diagnosis Date  . Edema   . Migraine headache   . Anemia     iron deficiency   Past Surgical History  Procedure Laterality Date  . Abdominal hysterectomy    . Cholecystectomy     Family History  Problem Relation Age of Onset  . Diabetes Mother   . Hypertension Mother   . Diabetes Father   . Hypertension Father   . Hypertension Brother   . Cancer Maternal Aunt     breast  . Diabetes Maternal Grandmother   . Diabetes Paternal Grandmother    Social History  Substance Use Topics  . Smoking status: Current Every Day Smoker -- 0.50 packs/day    Types: Cigarettes  . Smokeless tobacco: None  . Alcohol Use: No   OB History    No data available     Review of Systems  Musculoskeletal: Negative for joint swelling and gait problem.  Skin: Negative for wound.  Neurological: Positive for numbness. Negative for weakness and light-headedness.      Allergies  Review of patient's allergies indicates no known allergies.  Home Medications   Prior to Admission medications   Medication Sig Start Date End Date Taking? Authorizing Provider  albuterol (PROVENTIL HFA;VENTOLIN HFA) 108 (90 BASE) MCG/ACT inhaler  Inhale 2 puffs into the lungs every 4 (four) hours as needed for wheezing or shortness of breath. Patient not taking: Reported on 10/03/2014 09/26/13   Francine Graven, DO  hydrochlorothiazide (HYDRODIURIL) 12.5 MG tablet Take 1 tablet (12.5 mg total) by mouth daily. 02/12/13   Camelia Eng Tysinger, PA-C  HYDROcodone-acetaminophen (NORCO/VICODIN) 5-325 MG per tablet Take 1 tablet by mouth every 6 (six) hours as needed. 10/03/14   Milton Ferguson, MD  ibuprofen (ADVIL,MOTRIN) 600 MG tablet Take 1 tablet (600 mg total) by mouth every 8 (eight) hours as needed. 05/06/15   Davonna Belling, MD  naproxen (NAPROSYN) 500 MG tablet Take 1 tablet (500 mg total) by mouth 2 (two) times daily. 10/03/14   Milton Ferguson, MD  ondansetron (ZOFRAN) 4 MG tablet Take 1 tablet (4 mg total) by mouth every 6 (six) hours. Patient not taking: Reported on 07/28/2014 11/26/13   Merryl Hacker, MD  oxyCODONE-acetaminophen (PERCOCET/ROXICET) 5-325 MG per tablet Take 1-2 tablets by mouth every 4 (four) hours as needed. Patient not taking: Reported on 10/03/2014 07/27/14   Evalee Jefferson, PA-C  pantoprazole (PROTONIX) 20 MG tablet Take 1 tablet (20 mg total) by mouth daily. Patient not taking: Reported on 10/03/2014 04/30/13   Camelia Eng Tysinger, PA-C  phentermine 37.5 MG capsule Take 1 capsule (37.5 mg total) by mouth every morning. Patient not taking: Reported on 07/28/2014 08/08/13  Camelia Eng Tysinger, PA-C  potassium chloride (K-DUR,KLOR-CON) 10 MEQ tablet Take 1 tablet (10 mEq total) by mouth daily. Patient not taking: Reported on 07/28/2014 03/17/13   Camelia Eng Tysinger, PA-C  Pseudoeph-Doxylamine-DM-APAP (NYQUIL PO) Take 2 capsules by mouth at bedtime as needed and may repeat dose one time if needed (flu-like symptoms.).    Historical Provider, MD  topiramate (TOPAMAX) 25 MG tablet Take 1 tablet (25 mg total) by mouth daily. Patient not taking: Reported on 07/28/2014 12/08/13   Camelia Eng Tysinger, PA-C  traMADol (ULTRAM) 50 MG tablet Take 1 tablet  (50 mg total) by mouth every 6 (six) hours as needed. 05/06/15   Davonna Belling, MD   BP 121/77 mmHg  Pulse 92  Temp(Src) 98.4 F (36.9 C)  Resp 18  Ht 5\' 7"  (1.702 m)  Wt 205 lb (92.987 kg)  BMI 32.10 kg/m2  SpO2 97% Physical Exam  Constitutional: She appears well-developed.  Cardiovascular: Normal rate.   Musculoskeletal: She exhibits tenderness.  Tenderness over right medial epicondyle. No weakness or decreased sensation in hand. No tenderness over shoulder. Good range of motion elbow.    ED Course  Procedures (including critical care time) Labs Review Labs Reviewed - No data to display  Imaging Review No results found. I have personally reviewed and evaluated these images and lab results as part of my medical decision-making.   EKG Interpretation None      MDM   Final diagnoses:  Medial epicondylitis of elbow, right    Patient with likely medial epicondylitis on right side. Will give an inflammatory some small dose of tramadol. Will follow-up with PCP as needed.    Davonna Belling, MD 05/06/15 [phone]

## 2015-09-16 ENCOUNTER — Ambulatory Visit (INDEPENDENT_AMBULATORY_CARE_PROVIDER_SITE_OTHER): Payer: BLUE CROSS/BLUE SHIELD | Admitting: Neurology

## 2015-09-16 ENCOUNTER — Encounter: Payer: Self-pay | Admitting: Neurology

## 2015-09-16 VITALS — BP 124/77 | HR 89 | Ht 67.0 in | Wt 248.0 lb

## 2015-09-16 DIAGNOSIS — R3915 Urgency of urination: Secondary | ICD-10-CM | POA: Diagnosis not present

## 2015-09-16 DIAGNOSIS — R202 Paresthesia of skin: Secondary | ICD-10-CM | POA: Insufficient documentation

## 2015-09-16 HISTORY — DX: Urgency of urination: R39.15

## 2015-09-16 MED ORDER — NORTRIPTYLINE HCL 25 MG PO CAPS: ORAL_CAPSULE | ORAL | Status: DC

## 2015-09-16 NOTE — Progress Notes (Signed)
PATIENT: Virginia Douglas DOB: 05-06-1976  Chief Complaint  Patient presents with  . New Evaluation    Rm 1 alone. Here for diabetic neuropathy and sinus tarsi syndrome. Neuropathy been going on since March 2016.     HISTORICAL  Virginia Douglas is a 40 years old right-handed female, seen in refer by her primary care physician PA Darleen Crocker for evaluation of bilateral feet paresthesia  She had a history of migraine, was diagnosed with type 2 diabetes since October 2016, is now take metformin 500 mg twice a day, hypertension,  She noted bilateral feet numbness tingling burning sensation since March 2016, sometimes also involving her arms, variable spots of her body, this is triggered by stress, she was actually evaluated few months ago at emergency room because of left more than right arm paresthesia, there was no significant weakness notice at that time,   Over the past few months,  numbness tingling of her leg, arm discussed increase, she also complains generalized fatigue, mild unsteady gait sometimes, she complains of worsening urinary urgency since summer of 2016, no bowel incontinence, no visual changes.  REVIEW OF SYSTEMS: Full 14 system review of systems performed and notable only for weight gain, swelling legs, ringing ears, feeling hot, joint pain, cramps achy muscles headaches numbness weakness insomnia, restless leg  ALLERGIES: No Known Allergies  HOME MEDICATIONS: Current Outpatient Prescriptions  Medication Sig Dispense Refill  . gabapentin (NEURONTIN) 300 MG capsule Take 300 mg by mouth 3 (three) times daily. 1 capsule in morning, 1 capsule in afternoon, 2 capsules at night    . hydrochlorothiazide (HYDRODIURIL) 12.5 MG tablet Take 1 tablet (12.5 mg total) by mouth daily. 30 tablet 3  . metFORMIN (GLUCOPHAGE) 500 MG tablet Take 500 mg by mouth 2 (two) times daily with a meal.     No current facility-administered medications for this visit.    PAST MEDICAL  HISTORY: Past Medical History  Diagnosis Date  . Edema   . Migraine headache   . Anemia     iron deficiency  . Diabetes ([place])     PAST SURGICAL HISTORY: Past Surgical History  Procedure Laterality Date  . Abdominal hysterectomy    . Cholecystectomy      FAMILY HISTORY: Family History  Problem Relation Age of Onset  . Diabetes Mother   . Hypertension Mother   . Diabetes Father   . Hypertension Father   . Hypertension Brother   . Cancer Maternal Aunt     breast  . Diabetes Maternal Grandmother   . Diabetes Paternal Grandmother     SOCIAL HISTORY:  Social History   Social History  . Marital Status: Married    Spouse Name: Jeneen Rinks  . Number of Children: 2  . Years of Education: 16   Occupational History  . Not on file.   Social History Main Topics  . Smoking status: Current Every Day Smoker -- 0.50 packs/day    Types: Cigarettes  . Smokeless tobacco: Not on file  . Alcohol Use: No  . Drug Use: No  . Sexual Activity: Not on file   Other Topics Concern  . 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  PHYSICAL EXAMNIATION:  Gen: NAD, conversant, well nourised, obese, well groomed                     Cardiovascular: Regular rate rhythm, no peripheral edema, warm, nontender. Eyes: Conjunctivae clear without exudates or hemorrhage Neck: Supple, no carotid bruise. Pulmonary: Clear to auscultation bilaterally   NEUROLOGICAL EXAM:  MENTAL STATUS: Speech:    Speech is normal; fluent and spontaneous with normal comprehension.  Cognition:     Orientation to time, place and person     Normal recent and  remote memory     Normal Attention span and concentration     Normal Language, naming, repeating,spontaneous speech     Fund of knowledge   CRANIAL NERVES: CN II: Visual fields are full to confrontation. Fundoscopic exam is normal with sharp discs and no vascular changes. Pupils are round equal and briskly reactive to light. CN III, IV, VI: extraocular movement are normal. No ptosis. CN V: Facial sensation is intact to pinprick in all 3 divisions bilaterally. Corneal responses are intact.  CN VII: Face is symmetric with normal eye closure and smile. CN VIII: Hearing is normal to rubbing fingers CN IX, X: Palate elevates symmetrically. Phonation is normal. CN XI: Head turning and shoulder shrug are intact CN XII: Tongue is midline with normal movements and no atrophy.  MOTOR: There is no pronator drift of out-stretched arms. Muscle bulk and tone are normal. Muscle strength is normal.  REFLEXES: Reflexes are 2 and symmetric at the biceps, triceps, knees, and ankles. Plantar responses are flexor.  SENSORY: Intact to light touch, pinprick, position sense, and vibration sense are intact in fingers and toes.  COORDINATION: Rapid alternating movements and fine finger movements are intact. There is no dysmetria on finger-to-nose and heel-knee-shin.    GAIT/STANCE: Posture is normal. Gait is steady with normal steps, base, arm swing, and turning. Heel and toe walking are normal. Tandem gait is normal.  Romberg is absent.   DIAGNOSTIC DATA (LABS, IMAGING, TESTING) - I reviewed patient records, labs, notes, testing and imaging myself where available.   ASSESSMENT AND PLAN  Virginia Douglas is a 40 y.o. female   Bilateral upper and lower extremity intermittent paresthesia  Differentiation diagnosis including peripheral neuropathy    Need to rule out cervical spine structural lesion, proceed with MRI of cervical spine  EMG nerve conduction study  Laboratory evaluations    [name], M.D. Ph.D.  [facility] [address], [place], [place] [address] Ph: [phone] Fax: [phone]  CC: [name] r

## 2015-09-17 ENCOUNTER — Telehealth: Payer: Self-pay | Admitting: Neurology

## 2015-09-17 LAB — CK: Total CK: 133 U/L (ref 24–173)

## 2015-09-17 LAB — ANA W/REFLEX IF POSITIVE: Anti Nuclear Antibody(ANA): NEGATIVE

## 2015-09-17 LAB — SEDIMENTATION RATE: Sed Rate: 33 mm/hr — ABNORMAL HIGH (ref 0–32)

## 2015-09-17 LAB — VITAMIN B12: Vitamin B-12: 1539 pg/mL — ABNORMAL HIGH (ref 211–946)

## 2015-09-17 LAB — C-REACTIVE PROTEIN: CRP: 17.9 mg/L — ABNORMAL HIGH (ref 0.0–4.9)

## 2015-09-17 LAB — RPR: RPR Ser Ql: NONREACTIVE

## 2015-09-17 NOTE — Telephone Encounter (Signed)
Called to discuss lab results with pt, no answer, left a message asking her to call back.

## 2015-09-17 NOTE — Telephone Encounter (Signed)
Please call patient, mild elevated C reactive protein ESR of unknown clinical significance, rest of the laboratory including B12, inflammatory markers was normal.

## 2015-09-20 ENCOUNTER — Telehealth: Payer: Self-pay | Admitting: Neurology

## 2015-09-20 NOTE — Telephone Encounter (Signed)
Called pt again, no answer, left another message asking her to call back.

## 2015-09-20 NOTE — Telephone Encounter (Signed)
Patient called wanting to follow up with a script that Dr. Krista Blue was going to call in for her pain. She stated the script started with "N" SHe would like the script to be called in to [facility] in [place]. Please call and advise . Patient would like a voicemail to be left if sje does not answer. [phone]

## 2015-09-20 NOTE — Telephone Encounter (Signed)
Nortriptyline was sent to pt's [facility] pharmacy on 1/12, and a receipt of confirmation was received.  I have called this pt twice to discuss lab results.  I left another message asking that she call us back.

## 2015-09-21 NOTE — Telephone Encounter (Signed)
Four messages have been left on her voicemail.  Nortriptyline has been sent to the pharmacy.  Labs will be discussed at her follow up appt.

## 2015-09-22 ENCOUNTER — Ambulatory Visit (INDEPENDENT_AMBULATORY_CARE_PROVIDER_SITE_OTHER): Payer: BLUE CROSS/BLUE SHIELD

## 2015-09-22 DIAGNOSIS — R202 Paresthesia of skin: Secondary | ICD-10-CM

## 2015-09-22 DIAGNOSIS — R3915 Urgency of urination: Secondary | ICD-10-CM

## 2015-09-24 ENCOUNTER — Telehealth: Payer: Self-pay | Admitting: Neurology

## 2015-09-24 NOTE — Telephone Encounter (Signed)
I have called and left message that MRI cervical is normal.

## 2015-09-27 ENCOUNTER — Ambulatory Visit (INDEPENDENT_AMBULATORY_CARE_PROVIDER_SITE_OTHER): Payer: BLUE CROSS/BLUE SHIELD | Admitting: Neurology

## 2015-09-27 ENCOUNTER — Ambulatory Visit (INDEPENDENT_AMBULATORY_CARE_PROVIDER_SITE_OTHER): Payer: Self-pay | Admitting: Neurology

## 2015-09-27 DIAGNOSIS — R202 Paresthesia of skin: Secondary | ICD-10-CM | POA: Diagnosis not present

## 2015-09-27 DIAGNOSIS — R3915 Urgency of urination: Secondary | ICD-10-CM

## 2015-09-27 DIAGNOSIS — Z0289 Encounter for other administrative examinations: Secondary | ICD-10-CM

## 2015-09-27 NOTE — Progress Notes (Signed)
Electrodiagnostic study today was normal, there is no evidence of large fiber peripheral neuropathy or left lumbosacral radiculopathy.  She complains of constant bilateral feet paresthesia, numbness tingling, responding to nortriptyline, I have advised her to increase to 25 mg 2 tablets every night,  Skin biopsy to rule out small fiber neuropathy, laboratory evaluations, previous laboratory showed elevated ESR, C-reactive protein, we will repeat. 

## 2015-09-27 NOTE — Procedures (Signed)
   NCS (NERVE CONDUCTION STUDY) WITH EMG (ELECTROMYOGRAPHY) REPORT   STUDY DATE: January 23rd 2017 PATIENT NAME: Virginia Douglas DOB: 1976/07/24 MRN: [id number]    TECHNOLOGIST: Laretta Alstrom ELECTROMYOGRAPHER: [name] M.D.  CLINICAL INFORMATION:  40 years old female presented with progressive bilateral lower extremity paresthesia.   FINDINGS: NERVE CONDUCTION STUDY: Bilateral peroneal sensory responses were normal. Bilateral peroneal to EDB and tibial motor responses were normal. Bilateral tibial H reflexes were normal and symmetric.  Bilateral ulnar sensory and motor responses were normal. Bilateral median sensory response showed mildly prolonged peak latency, with normal snap amplitude. Bilateral median motor responses showed mildly prolonged distal latency, was normal C map amplitude, conduction velocity.  NEEDLE ELECTROMYOGRAPHY: Selected needle examinations was performed at left lower extremity muscles, left lumbosacral paraspinal muscles.  Needle examination of left tibialis anterior, medial gastrocnemius, peroneal longus, vastus lateralis, gluteus medius was normal.  There was no spontaneous activity at left lumbar sacral paraspinal muscles, left L4-5 S1.  IMPRESSION:   This is a normal study.  There is no electrodiagnostic evidence of large fiber peripheral neuropathy or left lumbosacral radiculopathy.   INTERPRETING PHYSICIAN:   [name] M.D. Ph.D. [facility] [address], [place], [place] [address] [phone]

## 2015-09-29 ENCOUNTER — Telehealth: Payer: Self-pay | Admitting: Neurology

## 2015-09-29 LAB — PROTEIN ELECTROPHORESIS
A/G Ratio: 1.1 (ref 0.7–1.7)
Albumin ELP: 3.9 g/dL (ref 2.9–4.4)
Alpha 1: 0.2 g/dL (ref 0.0–0.4)
Alpha 2: 1 g/dL (ref 0.4–1.0)
Beta: 1.2 g/dL (ref 0.7–1.3)
Gamma Globulin: 1.1 g/dL (ref 0.4–1.8)
Globulin, Total: 3.5 g/dL (ref 2.2–3.9)
Total Protein: 7.4 g/dL (ref 6.0–8.5)

## 2015-09-29 LAB — SEDIMENTATION RATE: Sed Rate: 21 mm/hr (ref 0–32)

## 2015-09-29 LAB — C-REACTIVE PROTEIN: CRP: 11.5 mg/L — ABNORMAL HIGH (ref 0.0–4.9)

## 2015-09-29 LAB — VITAMIN B1: Thiamine: 80.3 nmol/L (ref 66.5–200.0)

## 2015-09-29 LAB — B. BURGDORFI ANTIBODIES: Lyme IgG/IgM Ab: <0.91 {ISR} (ref 0.00–0.90)

## 2015-09-29 LAB — HGB A1C W/O EAG: Hgb A1c MFr Bld: 7.7 % — ABNORMAL HIGH (ref 4.8–5.6)

## 2015-09-29 NOTE — Telephone Encounter (Signed)
Please call patient, lab showed elevated A1C 7.7, indicating suboptimal control of his glucose level.  There is mild elevated CRP of unknown clinical significance, rest of the laboratory was normal

## 2015-09-29 NOTE — Telephone Encounter (Signed)
Called home number, no answer. LM on VM to call us back for results.

## 2015-09-29 NOTE — Telephone Encounter (Signed)
Left message for a return call

## 2015-09-29 NOTE — Telephone Encounter (Signed)
She is aware of results - her PCP has recently changed her medications to get better control of diabetes.

## 2015-11-02 ENCOUNTER — Encounter: Payer: BLUE CROSS/BLUE SHIELD | Admitting: Neurology

## 2015-11-03 ENCOUNTER — Ambulatory Visit: Payer: BLUE CROSS/BLUE SHIELD | Admitting: Neurology

## 2015-11-24 ENCOUNTER — Ambulatory Visit: Payer: BLUE CROSS/BLUE SHIELD | Admitting: Neurology

## 2015-12-08 ENCOUNTER — Ambulatory Visit (INDEPENDENT_AMBULATORY_CARE_PROVIDER_SITE_OTHER): Payer: BLUE CROSS/BLUE SHIELD | Admitting: Women's Health

## 2015-12-08 ENCOUNTER — Encounter: Payer: Self-pay | Admitting: Women's Health

## 2015-12-08 ENCOUNTER — Other Ambulatory Visit (HOSPITAL_COMMUNITY)
Admission: RE | Admit: 2015-12-08 | Discharge: 2015-12-08 | Disposition: A | Discharge status: Home / Self Care | Payer: BLUE CROSS/BLUE SHIELD | Source: Ambulatory Visit | Attending: Obstetrics & Gynecology | Admitting: Obstetrics & Gynecology

## 2015-12-08 VITALS — BP 126/70 | HR 80 | Ht 66.5 in | Wt 251.0 lb

## 2015-12-08 DIAGNOSIS — Z01411 Encounter for gynecological examination (general) (routine) with abnormal findings: Secondary | ICD-10-CM | POA: Diagnosis not present

## 2015-12-08 DIAGNOSIS — Z01419 Encounter for gynecological examination (general) (routine) without abnormal findings: Secondary | ICD-10-CM

## 2015-12-08 DIAGNOSIS — Z720 Tobacco use: Secondary | ICD-10-CM | POA: Diagnosis not present

## 2015-12-08 DIAGNOSIS — R1032 Left lower quadrant pain: Secondary | ICD-10-CM | POA: Diagnosis not present

## 2015-12-08 DIAGNOSIS — I110 Hypertensive heart disease with heart failure: Secondary | ICD-10-CM | POA: Insufficient documentation

## 2015-12-08 DIAGNOSIS — Z1151 Encounter for screening for human papillomavirus (HPV): Secondary | ICD-10-CM | POA: Insufficient documentation

## 2015-12-08 DIAGNOSIS — F17200 Nicotine dependence, unspecified, uncomplicated: Secondary | ICD-10-CM | POA: Insufficient documentation

## 2015-12-08 DIAGNOSIS — I10 Essential (primary) hypertension: Secondary | ICD-10-CM | POA: Insufficient documentation

## 2015-12-08 DIAGNOSIS — E1169 Type 2 diabetes mellitus with other specified complication: Secondary | ICD-10-CM | POA: Insufficient documentation

## 2015-12-08 DIAGNOSIS — E119 Type 2 diabetes mellitus without complications: Secondary | ICD-10-CM | POA: Insufficient documentation

## 2015-12-08 HISTORY — DX: Left lower quadrant pain: R10.32

## 2015-12-08 NOTE — Progress Notes (Signed)
Patient ID: Virginia Douglas, female   DOB: [birth year], 40 y.o.   MRN: [id number] Subjective:   Virginia Douglas is a 40 y.o. G37P2012 African American female here for a routine well-woman exam.  No LMP recorded. Patient has had a hysterectomy.    Current complaints: constant sharp LLQ x 3 weeks, worse w/ bm's. Has daily bm's, strains occasionally. Had hysterectomy in ~2012 for fibroids, unsure if still has cervix- thinks she may. States she definitely still has both ovaries. She is sexually active w/ husband. The LLQ pain is not worsened w/ sexual activity.  Wants to talk to her pcp about getting colonoscopy b/c feels like pain may be r/t bowels. Denies blood in stools or recent change in bowel habits.  Maternal GGM and 2 maternal aunts had breast CA, mom had benign lump removed, she wonders if she needs mammogram. Denies any lumps/bumps/changes in her breasts.  PCP: Dr. Laurance Flatten at [facility]       Does not desire labs, done by PCP Has appt w/ PCP this Friday  Social History: Sexual: heterosexual Marital Status: married Living situation: with family Occupation: Passenger transport manager [employer] Tobacco/alcohol: stress smoking, etoh: none Illicit drugs: no history of illicit drug use  The following portions of the patient's history were reviewed and updated as appropriate: allergies, current medications, past family history, past medical history, past social history, past surgical history and problem list.  Past Medical History Past Medical History  Diagnosis Date  . Edema   . Migraine headache   . Anemia     iron deficiency  . Diabetes [facility])     Past Surgical History Past Surgical History  Procedure Laterality Date  . Abdominal hysterectomy    . Cholecystectomy      Gynecologic History No obstetric history on file.  No LMP recorded. Patient has had a hysterectomy. Contraception: status post hysterectomy Last Pap: >16yrs ago. Results were: normal Last  mammogram: never. Results were: n/a Last TCS: never  Obstetric History OB History  No data available    Current Medications Current Outpatient Prescriptions on File Prior to Visit  Medication Sig Dispense Refill  . gabapentin (NEURONTIN) 300 MG capsule Take 300 mg by mouth 3 (three) times daily. 1 capsule in morning, 1 capsule in afternoon, 2 capsules at night    . hydrochlorothiazide (HYDRODIURIL) 12.5 MG tablet Take 1 tablet (12.5 mg total) by mouth daily. 30 tablet 3  . metFORMIN (GLUCOPHAGE) 500 MG tablet Take 500 mg by mouth 2 (two) times daily with a meal.    . nortriptyline (PAMELOR) 25 MG capsule 1 tablet at night for one week then 2 tablets every night 60 capsule 6   No current facility-administered medications on file prior to visit.    Review of Systems Patient denies any headaches, blurred vision, shortness of breath, chest pain, abdominal pain, problems with bowel movements, urination, or intercourse.  Objective:  BP 126/70 mmHg  Pulse 80  Ht 5' 6.5" (1.689 m)  Wt 251 lb (113.853 kg)  BMI 39.91 kg/m2 Physical Exam  General:  Well developed, well nourished, no acute distress. She is alert and oriented x3. Skin:  Warm and dry Neck:  Midline trachea, no thyromegaly or nodules Cardiovascular: Regular rate and rhythm, no murmur heard Lungs:  Effort normal, all lung fields clear to auscultation bilaterally Breasts:  No dominant palpable mass, retraction, or nipple discharge Abdomen:  Soft, no hepatosplenomegaly or masses. + tenderness LLQ Pelvic:  External genitalia is normal  in appearance.  The vagina is normal in appearance. Normal nondorous d/c. She does still have a cervix. The cervix is bulbous, multiple pin-point red dots on anterior cervix, no CMT.  Thin prep pap is done w/ HR HPV cotesting. Uterus is surgically absent.  No adnexal masses noted. + tenderness to Lt adnexal region. Extremities:  No swelling or varicosities noted Psych:  She has a normal mood and  affect  Assessment:   Healthy well-woman exam LLQ/pelvic pain Family hx breast CA Smoker  Plan:  Discussed option of getting baseline mammogram, but b/c no current problems, and no 1st degree relatives w/ breast CA, her insurance may not cover- pt decided against mammogram at this time, will plan for mammogram @ 41yo unless any problems before then F/U 1wk for pelvic u/s and to see me after To discuss need for TCS w/ PCP Advised smoking cesation  Tawnya Crook CNM, [facility] 12/08/2015 4:11 PM

## 2015-12-10 LAB — CYTOLOGY - PAP

## 2015-12-15 ENCOUNTER — Encounter: Payer: Self-pay | Admitting: Women's Health

## 2015-12-15 ENCOUNTER — Ambulatory Visit (INDEPENDENT_AMBULATORY_CARE_PROVIDER_SITE_OTHER): Payer: BLUE CROSS/BLUE SHIELD

## 2015-12-15 ENCOUNTER — Ambulatory Visit (INDEPENDENT_AMBULATORY_CARE_PROVIDER_SITE_OTHER): Payer: BLUE CROSS/BLUE SHIELD | Admitting: Women's Health

## 2015-12-15 VITALS — BP 110/80 | HR 80 | Ht 67.0 in | Wt 250.0 lb

## 2015-12-15 DIAGNOSIS — R1032 Left lower quadrant pain: Secondary | ICD-10-CM

## 2015-12-15 NOTE — Progress Notes (Signed)
PELVIC US TA/TV: normal cervical stump,normal ov's bilat (mobile),no free fluid seen,no pain during ultrasound

## 2015-12-15 NOTE — Progress Notes (Signed)
Patient ID: Virginia Douglas, female   DOB: 09-06-1975, 40 y.o.   MRN: [id number]   [facility] Visit  Patient name: Virginia Douglas MRN [id number]  Date of birth: 1975/10/27  CC & HPI:  Virginia Douglas is a 40 y.o.  African American female presenting today for f/u pelvic u/s d/t LLQ pelvic/abd pain. S/p supracervical partial hysterectomy. Saw PCP Friday, referred her to GI.  No LMP recorded. Patient has had a hysterectomy. \ Last pap last week- normal  Pertinent History Reviewed:  Medical & Surgical Hx:   Past medical, surgical, family, and social history reviewed in electronic medical record Medications: Reviewed & Updated - see associated section Allergies: Reviewed in electronic medical record  Objective Findings:  Vitals: BP 110/80 mmHg  Pulse 80  Ht 5\' 7"  (1.702 m)  Wt 250 lb (113.399 kg)  BMI 39.15 kg/m2 Body mass index is 39.15 kg/(m^2).  Physical Examination: General appearance - alert, well appearing, and in no distress  Today's pelvic u/s:  Virginia Douglas is a 40 y.o. s/p hysterectomy, is here for a pelvic sonogram for LLQ pain .  Uterus Surgically absent,normal cervical stump  Right ovary 2.2 x 1.2 x 2 cm, wnl  Left ovary 2.7 x 1.5 x 2.2 cm, wnl  No free fluid seen  Technician Comments:  PELVIC US TA/TV: normal cervical stump,normal ov's bilat (mobile),no free fluid seen,no pain during ultrasound     [employer] 12/15/2015 3:39 PM    Assessment & Plan:  A:   Normal pelvic u/s, gyn not likely etiology of LLQ pelvic/abd pain  P:  Keep appt w/ GI  Return in about 1 year (around 12/14/2016) for physical.  Tawnya Crook CNM, [facility] 12/15/2015 4:44 PM

## 2015-12-20 ENCOUNTER — Other Ambulatory Visit: Payer: Self-pay

## 2015-12-20 ENCOUNTER — Emergency Department (HOSPITAL_COMMUNITY)
Admission: EM | Admit: 2015-12-20 | Discharge: 2015-12-20 | Disposition: A | Discharge status: Home / Self Care | Payer: BLUE CROSS/BLUE SHIELD | Attending: Emergency Medicine | Admitting: Emergency Medicine

## 2015-12-20 ENCOUNTER — Encounter (HOSPITAL_COMMUNITY): Payer: Self-pay | Admitting: Emergency Medicine

## 2015-12-20 DIAGNOSIS — Z79899 Other long term (current) drug therapy: Secondary | ICD-10-CM | POA: Insufficient documentation

## 2015-12-20 DIAGNOSIS — M79602 Pain in left arm: Secondary | ICD-10-CM | POA: Diagnosis present

## 2015-12-20 DIAGNOSIS — M7552 Bursitis of left shoulder: Secondary | ICD-10-CM | POA: Insufficient documentation

## 2015-12-20 DIAGNOSIS — E119 Type 2 diabetes mellitus without complications: Secondary | ICD-10-CM | POA: Insufficient documentation

## 2015-12-20 DIAGNOSIS — Z7984 Long term (current) use of oral hypoglycemic drugs: Secondary | ICD-10-CM | POA: Diagnosis not present

## 2015-12-20 DIAGNOSIS — F17200 Nicotine dependence, unspecified, uncomplicated: Secondary | ICD-10-CM | POA: Insufficient documentation

## 2015-12-20 MED ORDER — NAPROXEN 500 MG PO TABS: 500.0000 mg | ORAL_TABLET | Freq: Two times a day (BID) | ORAL | Status: DC

## 2015-12-20 MED ORDER — HYDROCODONE-ACETAMINOPHEN 5-325 MG PO TABS: ORAL_TABLET | ORAL | Status: DC

## 2015-12-20 NOTE — ED Notes (Addendum)
having left arm pain, rates pain 9/10.  Went to work this am and pain increased and pain runs from left to shoulder and mid chest.  Denies any injury or lifting.

## 2015-12-20 NOTE — Discharge Instructions (Signed)
Bursitis  Bursitis is when the fluid-filled sac (bursa) that covers and protects a joint is swollen (inflamed). Bursitis is most common near joints, especially the knees, elbows, hips, and shoulders.   HOME CARE  · Take medicines only as told by your doctor.  · If you were prescribed an antibiotic medicine, finish it all even if you start to feel better.  · Rest the affected area as told by your doctor.  ¨ Keep the area raised up.  ¨ Avoid doing things that make the pain worse.  · Apply ice to the injured area:  ¨ Place ice in a plastic bag.  ¨ Place a towel between your skin and the bag.  ¨ Leave the ice on for 20 minutes, 2-3 times a day.  · Use splints, braces, pads, or walking aids as told by your doctor.  · Keep all follow-up visits as told by your doctor. This is important.  GET HELP IF:   · You have more pain with home care.  · You have a fever.  · You have chills.     This information is not intended to replace advice given to you by your health care provider. Make sure you discuss any questions you have with your health care provider.     Document Released: 02/08/2010 Document Revised: 09/11/2014 Document Reviewed: 11/10/2013  Elsevier Interactive Patient Education ©2016 [employer]vier Inc[employer]

## 2015-12-20 NOTE — ED Provider Notes (Signed)
CSN: [id number]     Arrival date & time 12/20/15  1244 History  By signing my name below, I, Soijett Blue, attest that this documentation has been prepared under the direction and in the presence of Kem Parkinson, PA-C Electronically Signed: Soijett Blue, ED Scribe. 12/20/2015. 2:11 PM.   Chief Complaint  Patient presents with  . Arm Pain    left     Patient is a 40 y.o. female presenting with arm pain. The history is provided by the patient. No language interpreter was used.  Arm Pain This is a new problem. The current episode started 6 to 12 hours ago. The problem occurs rarely. The problem has not changed since onset.Pertinent negatives include no chest pain and no shortness of breath. The symptoms are aggravated by bending. Nothing relieves the symptoms. She has tried nothing for the symptoms. The treatment provided no relief.   c                 HPI Comments: Virginia Douglas is a 40 y.o. female who presents to the Emergency Department complaining of increasing left arm pain x this morning. Pt reports that she noticed the left arm pain while getting dressed for work. Pt notes that her left arm was tingling initially and she has pain from her left shoulder to her left fingers. Pt reports that her left arm pain is associated with lifting her arm up and denies any alleviating factors. Pt states that her job consists of repetitive movement. Pt has associated symptoms of left finger tightness, and left upper chest wall tenderness that is worse with arm movement. Pt denies left sided neck pain, SOB, CP, N/V, diaphoresis and any other symptoms. Denies allergies to medications.  Past Medical History  Diagnosis Date  . Edema   . Migraine headache   . Anemia     iron deficiency  . Diabetes [facility])    Past Surgical History  Procedure Laterality Date  . Abdominal hysterectomy    . Cholecystectomy     Family History  Problem Relation Age of Onset  . Diabetes Mother   . Hypertension Mother    . Diabetes Father   . Hypertension Father   . Hypertension Brother   . Cancer Maternal Aunt     breast  . Diabetes Maternal Grandmother   . Diabetes Paternal Grandmother    Social History  Substance Use Topics  . Smoking status: Current Some Day Smoker -- 0.50 packs/day    Types: Cigarettes  . Smokeless tobacco: None  . Alcohol Use: No   OB History    No data available     Review of Systems  Respiratory: Negative for shortness of breath.   Cardiovascular: Negative for chest pain.  Musculoskeletal: Positive for arthralgias. Negative for joint swelling.  Skin: Negative for color change, rash and wound.  Neurological: Negative for numbness.  All other systems reviewed and are negative.     Allergies  Review of patient's allergies indicates no known allergies.  Home Medications   Prior to Admission medications   Medication Sig Start Date End Date Taking? Authorizing Provider  BYETTA 5 MCG PEN 5 MCG/0.02ML SOPN injection  11/03/15   Historical Provider, MD  gabapentin (NEURONTIN) 300 MG capsule Take 300 mg by mouth 3 (three) times daily. 1 capsule in morning, 1 capsule in afternoon, 2 capsules at night    Historical Provider, MD  hydrochlorothiazide (HYDRODIURIL) 12.5 MG tablet Take 1 tablet (12.5 mg total) by mouth daily. 02/12/13  Camelia Eng Tysinger, PA-C  metFORMIN (GLUCOPHAGE) 500 MG tablet Take 500 mg by mouth 2 (two) times daily with a meal.    Historical Provider, MD  nortriptyline (PAMELOR) 25 MG capsule 1 tablet at night for one week then 2 tablets every night 09/16/15   [name], MD   BP 123/78 mmHg  Pulse 88  Temp(Src) 98.4 F (36.9 C) (Oral)  Resp 16  Ht 5\' 7"  (1.702 m)  Wt 198 lb (89.812 kg)  BMI 31.00 kg/m2  SpO2 98% Physical Exam  Constitutional: She is oriented to person, place, and time. She appears well-developed and well-nourished. No distress.  HENT:  Head: Normocephalic and atraumatic.  Eyes: EOM are normal.  Neck: Normal range of motion. Neck  supple.  Cardiovascular: Normal rate, regular rhythm and intact distal pulses.  Exam reveals no friction rub.   No murmur heard. Pulmonary/Chest: Effort normal and breath sounds normal. No respiratory distress. She has no wheezes. She has no rales. She exhibits tenderness (ttp of the left upper chest wall.  no crepitus.).  Abdominal: Soft. She exhibits no distension. There is no tenderness.  Musculoskeletal: Normal range of motion.  Reproducible pain with abduction. Left arm with mild tenderness at  [facility] joint. No bony deformity or edema.   Neurological: She is alert and oriented to person, place, and time.  Grip strengths are equal bilaterally. Distal sensation intact. Cap refill less than 2 seconds. Radial pulses are palpable.  Skin: Skin is warm and dry.  Psychiatric: She has a normal mood and affect. Her behavior is normal.  Nursing note and vitals reviewed.   ED Course  Procedures (including critical care time) DIAGNOSTIC STUDIES: Oxygen Saturation is 98% on RA, nl by my interpretation.    COORDINATION OF CARE: 2:11 PM Discussed treatment plan with pt at bedside which includes EKG and pt agreed to plan.    Labs Review Labs Reviewed - No data to display  Imaging Review No results found. I have personally reviewed and evaluated these images as part of my medical decision-making.   EKG Interpretation   Date/Time:  Monday December 20 2015 13:33:53 EDT Ventricular Rate:  99 PR Interval:  202 QRS Duration: 90 QT Interval:  378 QTC Calculation: 485 R Axis:   42 Text Interpretation:  Normal sinus rhythm Prolonged QT Borderline ECG  Confirmed by BEATON  MD, ROBERT ([id number]) on 12/21/2015 11:19:25 AM      MDM   Final diagnoses:  Bursitis, shoulder, left    Pt is well appearing.  Vitals stable.  PERC neg.  Sx's reproducible with movement of the arm.  NVI.  Likely related to inflammatory process.  Pt agrees to close PMD f/u or ER return if needed  I personally performed the  services described in this documentation, which was scribed in my presence. The recorded information has been reviewed and is accurate.     Kem Parkinson, PA-C 12/22/15 2128  Noemi Chapel, MD 12/23/15 [phone]

## 2016-01-03 ENCOUNTER — Ambulatory Visit: Payer: BLUE CROSS/BLUE SHIELD | Admitting: Neurology

## 2016-01-03 ENCOUNTER — Telehealth: Payer: Self-pay | Admitting: *Deleted

## 2016-01-03 NOTE — Telephone Encounter (Signed)
No showed skin biopsy appointment. 

## 2016-01-04 ENCOUNTER — Encounter: Payer: Self-pay | Admitting: Neurology

## 2016-03-10 ENCOUNTER — Encounter (HOSPITAL_COMMUNITY): Payer: Self-pay | Admitting: Emergency Medicine

## 2016-03-10 ENCOUNTER — Emergency Department (HOSPITAL_COMMUNITY)
Admission: EM | Admit: 2016-03-10 | Discharge: 2016-03-10 | Disposition: A | Discharge status: Home / Self Care | Payer: BLUE CROSS/BLUE SHIELD | Attending: Emergency Medicine | Admitting: Emergency Medicine

## 2016-03-10 DIAGNOSIS — R112 Nausea with vomiting, unspecified: Secondary | ICD-10-CM | POA: Insufficient documentation

## 2016-03-10 DIAGNOSIS — R519 Headache, unspecified: Secondary | ICD-10-CM

## 2016-03-10 DIAGNOSIS — Z79899 Other long term (current) drug therapy: Secondary | ICD-10-CM | POA: Insufficient documentation

## 2016-03-10 DIAGNOSIS — R51 Headache: Secondary | ICD-10-CM

## 2016-03-10 DIAGNOSIS — R739 Hyperglycemia, unspecified: Secondary | ICD-10-CM

## 2016-03-10 DIAGNOSIS — F17210 Nicotine dependence, cigarettes, uncomplicated: Secondary | ICD-10-CM | POA: Insufficient documentation

## 2016-03-10 DIAGNOSIS — E1165 Type 2 diabetes mellitus with hyperglycemia: Secondary | ICD-10-CM | POA: Insufficient documentation

## 2016-03-10 DIAGNOSIS — I10 Essential (primary) hypertension: Secondary | ICD-10-CM | POA: Insufficient documentation

## 2016-03-10 LAB — CBC
HCT: 40.3 % (ref 36.0–46.0)
Hemoglobin: 14 g/dL (ref 12.0–15.0)
MCH: 29.4 pg (ref 26.0–34.0)
MCHC: 34.7 g/dL (ref 30.0–36.0)
MCV: 84.5 fL (ref 78.0–100.0)
Platelets: 291 10*3/uL (ref 150–400)
RBC: 4.77 MIL/uL (ref 3.87–5.11)
RDW: 14.5 % (ref 11.5–15.5)
WBC: 10.6 10*3/uL — ABNORMAL HIGH (ref 4.0–10.5)

## 2016-03-10 LAB — CBG MONITORING, ED
Glucose-Capillary: 454 mg/dL — ABNORMAL HIGH (ref 65–99)
Glucose-Capillary: 375 mg/dL — ABNORMAL HIGH (ref 65–99)

## 2016-03-10 LAB — URINALYSIS, ROUTINE W REFLEX MICROSCOPIC
Bilirubin Urine: NEGATIVE
Glucose, UA: >1000 mg/dL — AB
Leukocytes, UA: NEGATIVE
Nitrite: NEGATIVE
Protein, ur: NEGATIVE mg/dL
Specific Gravity, Urine: 1.01 (ref 1.005–1.030)
pH: 5.5 (ref 5.0–8.0)

## 2016-03-10 LAB — URINE MICROSCOPIC-ADD ON

## 2016-03-10 LAB — BASIC METABOLIC PANEL
Anion gap: 11 (ref 5–15)
BUN: 9 mg/dL (ref 6–20)
CO2: 20 mmol/L — ABNORMAL LOW (ref 22–32)
Calcium: 9.1 mg/dL (ref 8.9–10.3)
Chloride: 101 mmol/L (ref 101–111)
Creatinine, Ser: 0.7 mg/dL (ref 0.44–1.00)
GFR calc Af Amer: >60 mL/min (ref >60)
GFR calc non Af Amer: >60 mL/min (ref >60)
Glucose, Bld: 421 mg/dL — ABNORMAL HIGH (ref 65–99)
Potassium: 4.3 mmol/L (ref 3.5–5.1)
Sodium: 132 mmol/L — ABNORMAL LOW (ref 135–145)

## 2016-03-10 MED ORDER — KETOROLAC TROMETHAMINE 30 MG/ML IJ SOLN
30.0000 mg | Freq: Once | INTRAMUSCULAR | Status: AC
  Administered 2016-03-10: 30 mg via INTRAVENOUS
  Filled 2016-03-10: qty 1

## 2016-03-10 MED ORDER — METOCLOPRAMIDE HCL 5 MG/ML IJ SOLN
10.0000 mg | Freq: Once | INTRAMUSCULAR | Status: AC
Start: 2016-03-10 — End: 2016-03-10
  Administered 2016-03-10: 10 mg via INTRAVENOUS
  Filled 2016-03-10: qty 2

## 2016-03-10 MED ORDER — METFORMIN HCL ER 500 MG PO TB24
1000.0000 mg | ORAL_TABLET | Freq: Two times a day (BID) | ORAL | Status: DC
Start: 2016-03-10 — End: 2019-08-15

## 2016-03-10 MED ORDER — INSULIN ASPART 100 UNIT/ML ~~LOC~~ SOLN
6.0000 [IU] | Freq: Once | SUBCUTANEOUS | Status: AC
  Administered 2016-03-10: 6 [IU] via SUBCUTANEOUS
  Filled 2016-03-10: qty 1

## 2016-03-10 NOTE — ED Notes (Signed)
Patient given discharge instruction, verbalized understand. IV removed, band aid applied. Patient ambulatory out of the department.  

## 2016-03-10 NOTE — ED Provider Notes (Signed)
Emergency Department Provider Note  Time seen: Approximately 12:08 PM  I have reviewed the triage vital signs and the nursing notes.   HISTORY  Chief Complaint Hyperglycemia  HPI Virginia Douglas is a 40 y.o. female with PMH of DM and migraine HA presents to the emergency department for evaluation of elevated blood sugars with associated right-sided headache. The headache began 4 days prior. It gradually worsened throughout the day. Patient describes it as a dull throbbing ache that does not respond to over-the-counter medications (Goody's powder). She denies any associated fever, chills, vomiting, diarrhea. She states the headache is somewhat atypical for her migraine headaches. No associated weakness or numbness. No difficulty walking.   Vision is also concerned about her elevated blood sugars. She states they have been elevated for the past several days despite compliance with medications. She is currently taking metformin and Byetta injections since October. She follows up with family practice provider locally. No chest pain. Patient does endorse some mild dysuria. No other complaints.  Past Medical History  Diagnosis Date  . Edema   . Migraine headache   . Anemia     iron deficiency  . Diabetes [facility])     Patient Active Problem List   Diagnosis Date Noted  . Diabetes ([place]) 12/08/2015  . Hypertension 12/08/2015  . Smoker 12/08/2015  . LLQ pain 12/08/2015  . Paresthesia 09/16/2015  . Urinary urgency 09/16/2015    Past Surgical History  Procedure Laterality Date  . Abdominal hysterectomy    . Cholecystectomy      Current Outpatient Rx  Name  Route  Sig  Dispense  Refill  . BYETTA 5 MCG PEN 5 MCG/0.02ML SOPN injection   Subcutaneous   Inject 5 mcg into the skin 2 (two) times daily with a meal.            Dispense as written.   . gabapentin (NEURONTIN) 300 MG capsule   Oral   Take 300 mg by mouth 2 (two) times daily.          . hydrochlorothiazide  (HYDRODIURIL) 12.5 MG tablet   Oral   Take 1 tablet (12.5 mg total) by mouth daily.   30 tablet   3   . HYDROcodone-acetaminophen (NORCO/VICODIN) 5-325 MG tablet      Take one tab po q 4-6 hrs prn pain   10 tablet   0   . metFORMIN (GLUCOPHAGE-XR) 500 MG 24 hr tablet   Oral   Take 1 tablet by mouth 2 (two) times daily.         . naproxen (NAPROSYN) 500 MG tablet   Oral   Take 1 tablet (500 mg total) by mouth 2 (two) times daily with a meal.   20 tablet   0   . nortriptyline (PAMELOR) 25 MG capsule      1 tablet at night for one week then 2 tablets every night Patient taking differently: Take 50 mg by mouth at bedtime.    60 capsule   6     Allergies Review of patient's allergies indicates no known allergies.  Family History  Problem Relation Age of Onset  . Diabetes Mother   . Hypertension Mother   . Diabetes Father   . Hypertension Father   . Hypertension Brother   . Cancer Maternal Aunt     breast  . Diabetes Maternal Grandmother   . Diabetes Paternal Grandmother     Social History Social History  Substance Use Topics  .  Smoking status: Current Some Day Smoker -- 0.50 packs/day    Types: Cigarettes  . Smokeless tobacco: None  . Alcohol Use: No    Review of Systems  Constitutional: No fever/chills; elevated blood sugars.  Eyes: No visual changes. ENT: No sore throat. Cardiovascular: Denies chest pain. Respiratory: Denies shortness of breath. Gastrointestinal: No abdominal pain.  No nausea, no vomiting.  No diarrhea.  No constipation. Genitourinary: Negative for dysuria. Musculoskeletal: Negative for back pain. Skin: Negative for rash. Neurological: Negative for focal weakness or numbness. Positive right sided HA x 4 days.   10-point ROS otherwise negative.  ____________________________________________   PHYSICAL EXAM:  VITAL SIGNS: ED Triage Vitals  Enc Vitals Group     BP 03/10/16 1135 124/79 mmHg     Pulse Rate 03/10/16 1135 103      Resp 03/10/16 1135 20     Temp 03/10/16 1135 97.8 F (36.6 C)     Temp Source 03/10/16 1135 Temporal     SpO2 03/10/16 1135 96 %     Weight 03/10/16 1135 200 lb (90.719 kg)     Height 03/10/16 1135 5\' 7"  (1.702 m)     Pain Score 03/10/16 1132 9    Constitutional: Alert and oriented. Well appearing and in no acute distress. Eyes: Conjunctivae are normal. PERRL. EOMI. Head: Atraumatic. Mouth/Throat: Mucous membranes are moist.  Oropharynx non-erythematous. Neck: No stridor.  Cardiovascular: Tachycardia. Good peripheral circulation. Grossly normal heart sounds.   Respiratory: Normal respiratory effort.  No retractions. Lungs CTAB. Gastrointestinal: Soft and nontender. No distention.  Musculoskeletal: No lower extremity tenderness nor edema. No gross deformities of extremities. Neurologic:  Normal speech and language. No gross focal neurologic deficits are appreciated. No focal cranial nerve deficit. Patient with no pronator drift. Skin:  Skin is warm, dry and intact. No rash noted. Psychiatric: Mood and affect are normal. Speech and behavior are normal.  ____________________________________________   LABS (all labs ordered are listed, but only abnormal results are displayed)  Labs Reviewed  BASIC METABOLIC PANEL - Abnormal; Notable for the following:    Sodium 132 (*)    CO2 20 (*)    Glucose, Bld 421 (*)    All other components within normal limits  CBC - Abnormal; Notable for the following:    WBC 10.6 (*)    All other components within normal limits  URINALYSIS, ROUTINE W REFLEX MICROSCOPIC (NOT AT [facility]) - Abnormal; Notable for the following:    Glucose, UA >1000 (*)    Hgb urine dipstick TRACE (*)    Ketones, ur TRACE (*)    All other components within normal limits  URINE MICROSCOPIC-ADD ON - Abnormal; Notable for the following:    Squamous Epithelial / LPF 0-5 (*)    Bacteria, UA FEW (*)    All other components within normal limits  CBG MONITORING, ED - Abnormal;  Notable for the following:    Glucose-Capillary 454 (*)    All other components within normal limits  CBG MONITORING, ED - Abnormal; Notable for the following:    Glucose-Capillary 375 (*)    All other components within normal limits  CBG MONITORING, ED   _________________________________________  RADIOLOGY  No results found.  ____________________________________________   PROCEDURES  Procedure(s) performed:   Procedures  None ____________________________________________   INITIAL IMPRESSION / ASSESSMENT AND PLAN / ED COURSE  Pertinent labs & imaging results that were available during my care of the patient were reviewed by me and considered in my medical decision making (  see chart for details).  Patient returns to the emergency department for evaluation of hyperglycemia and right sided headache. The headache began 4 days ago with slowly progressive intensity. He does respond intermittently to over-the-counter pain medications. No focal deficits on neurological exam. Very low suspicion for intracranial pathology such as bleed or mass. Act likely tension headache. No indication to suggest underlying infectious process. The patient does report some dysuria on review of systems. We'll investigate possible infection source as the cause for her hyperglycemia. Will rule out DKA and treat elevated blood glucose in the emergency department along with headache medications. Patient is followed closely by her family practitioner who actively manages her diabetes.   01:59 PM No UTI. No DKA. Will increase home dose of Metformin and have patient follow up with her PCP in the coming week for blood sugar follow up. HA resolved with treatment. Normal neurological exam. Patient is comfortable with plan at discharge.    ____________________________________________  FINAL CLINICAL IMPRESSION(S) / ED DIAGNOSES  Final diagnoses:  None     MEDICATIONS GIVEN DURING THIS VISIT:  Medications    ketorolac (TORADOL) 30 MG/ML injection 30 mg (not administered)  metoCLOPramide (REGLAN) injection 10 mg (not administered)     NEW OUTPATIENT MEDICATIONS STARTED DURING THIS VISIT:  Metformin dose change   Note:  This document was prepared using Dragon voice recognition software and may include unintentional dictation errors.  Nanda Quinton, MD Emergency Medicine  [name], MD 03/10/16 [phone]

## 2016-03-10 NOTE — ED Notes (Signed)
Pt states BS has been over 500 all week, complaining of headache and diaphoretic

## 2016-03-10 NOTE — Discharge Instructions (Signed)
You were seen in the ED today with high blood sugar. We have evaluated you closely and changed the dose of your Metformin. You will now take 2 tabs (1000 mg) twice a day. Call your primary care doctor and schedule an appointment for as soon as possible.   Return to the ED with any new or worsening symptoms.  Blood Glucose Monitoring, Adult Monitoring your blood glucose (also know as blood sugar) helps you to manage your diabetes. It also helps you and your health care provider monitor your diabetes and determine how well your treatment plan is working. WHY SHOULD YOU MONITOR YOUR BLOOD GLUCOSE?  It can help you understand how food, exercise, and medicine affect your blood glucose.  It allows you to know what your blood glucose is at any given moment. You can quickly tell if you are having low blood glucose (hypoglycemia) or high blood glucose (hyperglycemia).  It can help you and your health care provider know how to adjust your medicines.  It can help you understand how to manage an illness or adjust medicine for exercise. WHEN SHOULD YOU TEST? Your health care provider will help you decide how often you should check your blood glucose. This may depend on the type of diabetes you have, your diabetes control, or the types of medicines you are taking. Be sure to write down all of your blood glucose readings so that this information can be reviewed with your health care provider. See below for examples of testing times that your health care provider may suggest. Type 1 Diabetes  Test at least 2 times per day if your diabetes is well controlled, if you are using an insulin pump, or if you perform multiple daily injections.  If your diabetes is not well controlled or if you are sick, you may need to test more often.  It is a good idea to also test:  Before every insulin injection.  Before and after exercise.  Between meals and 2 hours after a meal.  Occasionally between 2:00 a.m. and 3:00  a.m. Type 2 Diabetes  If you are taking insulin, test at least 2 times per day. However, it is best to test before every insulin injection.  If you take medicines by mouth (orally), test 2 times a day.  If you are on a controlled diet, test once a day.  If your diabetes is not well controlled or if you are sick, you may need to monitor more often. HOW TO MONITOR YOUR BLOOD GLUCOSE Supplies Needed  Blood glucose meter.  Test strips for your meter. Each meter has its own strips. You must use the strips that go with your own meter.  A pricking needle (lancet).  A device that holds the lancet (lancing device).  A journal or log book to write down your results. Procedure  Wash your hands with soap and water. Alcohol is not preferred.  Prick the side of your finger (not the tip) with the lancet.  Gently milk the finger until a small drop of blood appears.  Follow the instructions that come with your meter for inserting the test strip, applying blood to the strip, and using your blood glucose meter. Other Areas to Get Blood for Testing Some meters allow you to use other areas of your body (other than your finger) to test your blood. These areas are called alternative sites. The most common alternative sites are:  The forearm.  The thigh.  The back area of the lower leg.  The palm of the hand. The blood flow in these areas is slower. Therefore, the blood glucose values you get may be delayed, and the numbers are different from what you would get from your fingers. Do not use alternative sites if you think you are having hypoglycemia. Your reading will not be accurate. Always use a finger if you are having hypoglycemia. Also, if you cannot feel your lows (hypoglycemia unawareness), always use your fingers for your blood glucose checks. ADDITIONAL TIPS FOR GLUCOSE MONITORING  Do not reuse lancets.  Always carry your supplies with you.  All blood glucose meters have a 24-hour  "hotline" number to call if you have questions or need help.  Adjust (calibrate) your blood glucose meter with a control solution after finishing a few boxes of strips. BLOOD GLUCOSE RECORD KEEPING It is a good idea to keep a daily record or log of your blood glucose readings. Most glucose meters, if not all, keep your glucose records stored in the meter. Some meters come with the ability to download your records to your home computer. Keeping a record of your blood glucose readings is especially helpful if you are wanting to look for patterns. Make notes to go along with the blood glucose readings because you might forget what happened at that exact time. Keeping good records helps you and your health care provider to work together to achieve good diabetes management.    This information is not intended to replace advice given to you by your health care provider. Make sure you discuss any questions you have with your health care provider.   Document Released: 08/24/2003 Document Revised: 09/11/2014 Document Reviewed: 01/13/2013 Elsevier Interactive Patient Education [employer].

## 2016-03-24 ENCOUNTER — Encounter (HOSPITAL_COMMUNITY): Payer: Self-pay

## 2016-03-24 ENCOUNTER — Emergency Department (HOSPITAL_COMMUNITY)
Admission: EM | Admit: 2016-03-24 | Discharge: 2016-03-24 | Disposition: A | Discharge status: Home / Self Care | Payer: BLUE CROSS/BLUE SHIELD | Source: Home / Self Care | Attending: Emergency Medicine | Admitting: Emergency Medicine

## 2016-03-24 ENCOUNTER — Encounter (HOSPITAL_COMMUNITY): Payer: Self-pay | Admitting: Emergency Medicine

## 2016-03-24 ENCOUNTER — Emergency Department (HOSPITAL_COMMUNITY)
Admission: EM | Admit: 2016-03-24 | Discharge: 2016-03-24 | Disposition: A | Discharge status: Home / Self Care | Payer: BLUE CROSS/BLUE SHIELD | Attending: Emergency Medicine | Admitting: Emergency Medicine

## 2016-03-24 DIAGNOSIS — E1165 Type 2 diabetes mellitus with hyperglycemia: Secondary | ICD-10-CM | POA: Insufficient documentation

## 2016-03-24 DIAGNOSIS — Z79899 Other long term (current) drug therapy: Secondary | ICD-10-CM | POA: Insufficient documentation

## 2016-03-24 DIAGNOSIS — F17210 Nicotine dependence, cigarettes, uncomplicated: Secondary | ICD-10-CM | POA: Insufficient documentation

## 2016-03-24 DIAGNOSIS — E860 Dehydration: Secondary | ICD-10-CM | POA: Diagnosis not present

## 2016-03-24 DIAGNOSIS — Z7984 Long term (current) use of oral hypoglycemic drugs: Secondary | ICD-10-CM

## 2016-03-24 DIAGNOSIS — R739 Hyperglycemia, unspecified: Secondary | ICD-10-CM

## 2016-03-24 DIAGNOSIS — Z794 Long term (current) use of insulin: Secondary | ICD-10-CM | POA: Diagnosis not present

## 2016-03-24 DIAGNOSIS — R519 Headache, unspecified: Secondary | ICD-10-CM

## 2016-03-24 DIAGNOSIS — R51 Headache: Secondary | ICD-10-CM

## 2016-03-24 LAB — CBC WITH DIFFERENTIAL/PLATELET
Basophils Absolute: 0 10*3/uL (ref 0.0–0.1)
Basophils Relative: 0 %
Eosinophils Absolute: 0.2 10*3/uL (ref 0.0–0.7)
Eosinophils Relative: 2 %
HCT: 39.8 % (ref 36.0–46.0)
Hemoglobin: 13.2 g/dL (ref 12.0–15.0)
Lymphocytes Relative: 41 %
Lymphs Abs: 4.6 10*3/uL — ABNORMAL HIGH (ref 0.7–4.0)
MCH: 28.2 pg (ref 26.0–34.0)
MCHC: 33.2 g/dL (ref 30.0–36.0)
MCV: 85 fL (ref 78.0–100.0)
Monocytes Absolute: 0.5 10*3/uL (ref 0.1–1.0)
Monocytes Relative: 5 %
Neutro Abs: 5.9 10*3/uL (ref 1.7–7.7)
Neutrophils Relative %: 52 %
Platelets: 265 10*3/uL (ref 150–400)
RBC: 4.68 MIL/uL (ref 3.87–5.11)
RDW: 14.6 % (ref 11.5–15.5)
WBC: 11.2 10*3/uL — ABNORMAL HIGH (ref 4.0–10.5)

## 2016-03-24 LAB — COMPREHENSIVE METABOLIC PANEL
ALT: 36 U/L (ref 14–54)
AST: 31 U/L (ref 15–41)
Albumin: 4.1 g/dL (ref 3.5–5.0)
Alkaline Phosphatase: 91 U/L (ref 38–126)
Anion gap: 7 (ref 5–15)
BUN: 6 mg/dL (ref 6–20)
CO2: 21 mmol/L — ABNORMAL LOW (ref 22–32)
Calcium: 9.4 mg/dL (ref 8.9–10.3)
Chloride: 108 mmol/L (ref 101–111)
Creatinine, Ser: 0.58 mg/dL (ref 0.44–1.00)
GFR calc Af Amer: >60 mL/min (ref >60)
GFR calc non Af Amer: >60 mL/min (ref >60)
Glucose, Bld: 385 mg/dL — ABNORMAL HIGH (ref 65–99)
Potassium: 3.8 mmol/L (ref 3.5–5.1)
Sodium: 136 mmol/L (ref 135–145)
Total Bilirubin: 0.3 mg/dL (ref 0.3–1.2)
Total Protein: 6.9 g/dL (ref 6.5–8.1)

## 2016-03-24 LAB — URINALYSIS, ROUTINE W REFLEX MICROSCOPIC
Bilirubin Urine: NEGATIVE
Bilirubin Urine: NEGATIVE
Glucose, UA: >1000 mg/dL — AB
Glucose, UA: >1000 mg/dL — AB
Hgb urine dipstick: NEGATIVE
Hgb urine dipstick: NEGATIVE
Ketones, ur: NEGATIVE mg/dL
Ketones, ur: NEGATIVE mg/dL
Leukocytes, UA: NEGATIVE
Leukocytes, UA: NEGATIVE
Nitrite: NEGATIVE
Nitrite: NEGATIVE
Protein, ur: NEGATIVE mg/dL
Protein, ur: NEGATIVE mg/dL
Specific Gravity, Urine: 1.01 (ref 1.005–1.030)
Specific Gravity, Urine: 1.041 — ABNORMAL HIGH (ref 1.005–1.030)
pH: 6 (ref 5.0–8.0)
pH: 5.5 (ref 5.0–8.0)

## 2016-03-24 LAB — CBG MONITORING, ED
Glucose-Capillary: 431 mg/dL — ABNORMAL HIGH (ref 65–99)
Glucose-Capillary: 354 mg/dL — ABNORMAL HIGH (ref 65–99)
Glucose-Capillary: 310 mg/dL — ABNORMAL HIGH (ref 65–99)
Glucose-Capillary: 384 mg/dL — ABNORMAL HIGH (ref 65–99)

## 2016-03-24 LAB — BASIC METABOLIC PANEL
Anion gap: 9 (ref 5–15)
BUN: 10 mg/dL (ref 6–20)
CO2: 22 mmol/L (ref 22–32)
Calcium: 9.8 mg/dL (ref 8.9–10.3)
Chloride: 104 mmol/L (ref 101–111)
Creatinine, Ser: 0.58 mg/dL (ref 0.44–1.00)
GFR calc Af Amer: >60 mL/min (ref >60)
GFR calc non Af Amer: >60 mL/min (ref >60)
Glucose, Bld: 459 mg/dL — ABNORMAL HIGH (ref 65–99)
Potassium: 4 mmol/L (ref 3.5–5.1)
Sodium: 135 mmol/L (ref 135–145)

## 2016-03-24 LAB — URINE MICROSCOPIC-ADD ON
Bacteria, UA: NONE SEEN
RBC / HPF: NONE SEEN RBC/hpf (ref 0–5)
Squamous Epithelial / LPF: NONE SEEN
WBC, UA: NONE SEEN WBC/hpf (ref 0–5)

## 2016-03-24 LAB — PREGNANCY, URINE: Preg Test, Ur: NEGATIVE

## 2016-03-24 MED ORDER — KETOROLAC TROMETHAMINE 30 MG/ML IJ SOLN
30.0000 mg | Freq: Once | INTRAMUSCULAR | Status: AC
Start: 2016-03-24 — End: 2016-03-24
  Administered 2016-03-24: 30 mg via INTRAVENOUS
  Filled 2016-03-24: qty 1

## 2016-03-24 MED ORDER — INSULIN ASPART 100 UNIT/ML ~~LOC~~ SOLN
5.0000 [IU] | Freq: Once | SUBCUTANEOUS | Status: AC
  Administered 2016-03-24: 5 [IU] via INTRAVENOUS
  Filled 2016-03-24: qty 1

## 2016-03-24 MED ORDER — INSULIN ASPART 100 UNIT/ML ~~LOC~~ SOLN
5.0000 [IU] | Freq: Once | SUBCUTANEOUS | Status: AC
  Administered 2016-03-24: 5 [IU] via SUBCUTANEOUS
  Filled 2016-03-24: qty 1

## 2016-03-24 MED ORDER — SODIUM CHLORIDE 0.9 % IV BOLUS (SEPSIS): 500.0000 mL | Freq: Once | INTRAVENOUS | Status: DC

## 2016-03-24 MED ORDER — SODIUM CHLORIDE 0.9 % IV BOLUS (SEPSIS)
1000.0000 mL | Freq: Once | INTRAVENOUS | Status: AC
  Administered 2016-03-24: 1000 mL via INTRAVENOUS

## 2016-03-24 NOTE — ED Provider Notes (Signed)
CSN: [id number]     Arrival date & time 03/24/16  2116 History   By signing my name below, I, Virginia Douglas. Virginia Douglas, attest that this documentation has been prepared under the direction and in the presence of Deno Etienne, DO.  Electronically Signed: Maud Douglas. Virginia Douglas, ED Scribe. 03/24/2016. 11:34 PM.   Chief Complaint  Patient presents with  . Hyperglycemia   The history is provided by the patient. No language interpreter was used.    HPI Comments: Virginia Douglas is a 40 y.o. female with a PMHx of diabetes and migraines who presents to the Emergency Department here for possible hyperglycemia x 1 week. Pt states her blood sugars have fluctuated continuously in the last several days. She was evaluated at [facility] earlier today for initial evaluation. At that time, blood sugar was improved to 315. She then checked her blood sugar again earlier this evening with a reading of 525. She also noted an ongoing frontal HA x 1 day. HA does not feel similar to previous migraines. Nausea and 1 episode of vomiting earlier this evening reported. No recent fever, chills, chest pain, shortness of breath, frequency, or dysuria. Pt is on Byetta and Metformin for at home diabetes treatment.   PCP: [name]    Past Medical History  Diagnosis Date  . Edema   . Migraine headache   . Anemia     iron deficiency  . Diabetes [facility])    Past Surgical History  Procedure Laterality Date  . Abdominal hysterectomy    . Cholecystectomy     Family History  Problem Relation Age of Onset  . Diabetes Mother   . Hypertension Mother   . Diabetes Father   . Hypertension Father   . Hypertension Brother   . Cancer Maternal Aunt     breast  . Diabetes Maternal Grandmother   . Diabetes Paternal Grandmother    Social History  Substance Use Topics  . Smoking status: Current Some Day Smoker -- 0.00 packs/day    Types: Cigarettes  . Smokeless tobacco: None  . Alcohol Use: No   OB History    No data  available     Review of Systems  Constitutional: Negative for fever and chills.  HENT: Negative for congestion and rhinorrhea.   Eyes: Negative for redness and visual disturbance.  Respiratory: Negative for shortness of breath and wheezing.   Cardiovascular: Negative for chest pain and palpitations.  Gastrointestinal: Negative for nausea and vomiting.  Genitourinary: Negative for dysuria and urgency.  Musculoskeletal: Negative for myalgias and arthralgias.  Skin: Negative for pallor and wound.  Neurological: Positive for headaches. Negative for dizziness.      Allergies  Review of patient's allergies indicates no known allergies.  Home Medications   Prior to Admission medications   Medication Sig Start Date End Date Taking? Authorizing Provider  BYETTA 5 MCG PEN 5 MCG/0.02ML SOPN injection Inject 5 mcg into the skin 2 (two) times daily with a meal.  11/03/15   Historical Provider, MD  gabapentin (NEURONTIN) 300 MG capsule Take 300 mg by mouth 2 (two) times daily.     Historical Provider, MD  hydrochlorothiazide (HYDRODIURIL) 12.5 MG tablet Take 1 tablet (12.5 mg total) by mouth daily. 02/12/13   Carlena Hurl, PA-C  HYDROcodone-acetaminophen (NORCO/VICODIN) 5-325 MG tablet Take one tab po q 4-6 hrs prn pain Patient not taking: Reported on 03/10/2016 12/20/15   [name], PA-C  metFORMIN (GLUCOPHAGE-XR) 500 MG 24 hr tablet Take 2  tablets (1,000 mg total) by mouth 2 (two) times daily. 03/10/16   [name], MD  naproxen (NAPROSYN) 500 MG tablet Take 1 tablet (500 mg total) by mouth 2 (two) times daily with a meal. Patient not taking: Reported on 03/10/2016 12/20/15   [name], PA-C  nortriptyline (PAMELOR) 25 MG capsule 1 tablet at night for one week then 2 tablets every night Patient taking differently: Take 50 mg by mouth at bedtime.  09/16/15   [name], MD   Triage Vitals: BP 123/77 mmHg  Pulse 76  Temp(Src) 98.3 F (36.8 C) (Oral)  Resp 16  SpO2 98%   Physical Exam   Constitutional: She is oriented to person, place, and time. She appears well-developed and well-nourished. No distress.  HENT:  Head: Normocephalic and atraumatic.  Nose: Right sinus exhibits no maxillary sinus tenderness and no frontal sinus tenderness. Left sinus exhibits no maxillary sinus tenderness and no frontal sinus tenderness.  Mouth/Throat: Oropharynx is clear and moist. No posterior oropharyngeal erythema.  Swollen turbinates   Eyes: EOM are normal. Pupils are equal, round, and reactive to light.  Neck: Normal range of motion. Neck supple.  Cardiovascular: Normal rate and regular rhythm.  Exam reveals no gallop and no friction rub.   No murmur heard. Pulmonary/Chest: Effort normal. She has no wheezes. She has no rales.  Abdominal: Soft. She exhibits no distension. There is no tenderness.  Musculoskeletal: She exhibits no edema or tenderness.  Neurological: She is alert and oriented to person, place, and time.  Skin: Skin is warm and dry. She is not diaphoretic.  Psychiatric: She has a normal mood and affect. Her behavior is normal.  Nursing note and vitals reviewed.   ED Course  Procedures (including critical care time)  DIAGNOSTIC STUDIES: Oxygen Saturation is 99% on RA, Normal by my interpretation.    COORDINATION OF CARE: 11:32 PM- Will order blood work, urinalysis, and pregnancy urine. Discussed treatment plan with pt at bedside and pt agreed to plan.     Labs Review Labs Reviewed  CBC WITH DIFFERENTIAL/PLATELET - Abnormal; Notable for the following:    WBC 11.2 (*)    Lymphs Abs 4.6 (*)    All other components within normal limits  COMPREHENSIVE METABOLIC PANEL - Abnormal; Notable for the following:    CO2 21 (*)    Glucose, Bld 385 (*)    All other components within normal limits  URINALYSIS, ROUTINE W REFLEX MICROSCOPIC (NOT AT [facility]) - Abnormal; Notable for the following:    Specific Gravity, Urine 1.041 (*)    Glucose, UA >1000 (*)    All other components  within normal limits  URINE MICROSCOPIC-ADD ON - Abnormal; Notable for the following:    Squamous Epithelial / LPF 0-5 (*)    Bacteria, UA RARE (*)    All other components within normal limits  CBG MONITORING, ED - Abnormal; Notable for the following:    Glucose-Capillary 384 (*)    All other components within normal limits  PREGNANCY, URINE  POC URINE PREG, ED    Imaging Review No results found. I have personally reviewed and evaluated these images and lab results as part of my medical decision-making.   EKG Interpretation None      MDM   Final diagnoses:  Hyperglycemia    40 yo F with a cc of hyperglycemia.  Mild headache.  No noted neuro issues.  Patient with some congestion, feel likely uri induced hyperglycemia.  Not in DKA.  PCP  follow up.     I personally performed the services described in this documentation, which was scribed in my presence. The recorded information has been reviewed and is accurate.   11:44 PM:  I have discussed the diagnosis/risks/treatment options with the patient and family and believe the pt to be eligible for discharge home to follow-up with PCP. We also discussed returning to the ED immediately if new or worsening sx occur. We discussed the sx which are most concerning (e.g., sudden worsening pain, fever, inability to tolerate by mouth) that necessitate immediate return. Medications administered to the patient during their visit and any new prescriptions provided to the patient are listed below.  Medications given during this visit Medications - No data to display  New Prescriptions   No medications on file    The patient appears reasonably screen and/or stabilized for discharge and I doubt any other medical condition or other [facility] requiring further screening, evaluation, or treatment in the ED at this time prior to discharge.     Deno Etienne, DO 03/24/16 2345

## 2016-03-24 NOTE — ED Notes (Signed)
Pt alert & oriented x4, stable gait. Patient given discharge instructions, paperwork & prescription(s). Patient  instructed to stop at the registration desk to finish any additional paperwork. Patient verbalized understanding. Pt left department w/ no further questions. 

## 2016-03-24 NOTE — Discharge Instructions (Signed)
Monitor your blood sugar closely, try to figure out if anything you eat makes your blood sugar spike higher.  Follow up with your doctor. You may need to have your diabetes medications adjusted or start something in addition to control your blood sugar better.

## 2016-03-24 NOTE — ED Notes (Signed)
Pt. reports elevated blood sugar at home this evening = 525 , denies pain or discomfort , alert and oriented / respirations unlabored, seen at [facility] this morning for the same complaint.

## 2016-03-24 NOTE — Discharge Instructions (Signed)
Hyperglycemia °High blood sugar (hyperglycemia) means that the level of sugar in your blood is higher than it should be. Signs of high blood sugar include: °· Feeling thirsty. °· Frequent peeing (urinating). °· Feeling tired or sleepy. °· Dry mouth. °· Vision changes. °· Feeling weak. °· Feeling hungry but losing weight. °· Numbness and tingling in your hands or feet. °· Headache. °When you ignore these signs, your blood sugar may keep going up. These problems may get worse, and other problems may begin. °HOME CARE °· Check your blood sugars as told by your doctor. Write down the numbers with the date and time. °· Take the right amount of insulin or diabetes pills at the right time. Write down the dose with date and time. °· Refill your insulin or diabetes pills before running out. °· Watch what you eat. Follow your meal plan. °· Drink liquids without sugar, such as water. Check with your doctor if you have kidney or heart disease. °· Follow your doctor's orders for exercise. Exercise at the same time of day. °· Keep your doctor's appointments. °GET HELP RIGHT AWAY IF:  °· You have trouble thinking or are confused. °· You have fast breathing with fruity smelling breath. °· You pass out (faint). °· You have 2 to 3 days of high blood sugars and you do not know why. °· You have chest pain. °· You are feeling sick to your stomach (nauseous) or throwing up (vomiting). °· You have sudden vision changes. °MAKE SURE YOU:  °· Understand these instructions. °· Will watch your condition. °· Will get help right away if you are not doing well or get worse. °  °This information is not intended to replace advice given to you by your health care provider. Make sure you discuss any questions you have with your health care provider. °  °Document Released: 06/18/2009 Document Revised: 09/11/2014 Document Reviewed: 04/27/2015 °Elsevier Interactive Patient Education ©2016 Else[employer] ° °

## 2016-03-24 NOTE — ED Provider Notes (Signed)
CSN: [id number]     Arrival date & time 03/24/16  [id number] History   First MD Initiated Contact with Patient 03/24/16 04:57 AM    Chief Complaint  Patient presents with  . Hyperglycemia     (Consider location/radiation/quality/duration/timing/severity/associated sxs/prior Treatment) HPI patient reports yesterday she started having polydipsia and polyuria. She reports nocturia tonight every hour. She states about an hour ago she got up and checked her CBG but it would not read on her meter. She states last week her blood sugars were normal in the 98-120 range. However they have been high this past week. She denies any change in her activity or change in her diet. She states she's actually has been eating better since her last ED visit on July 7. She has cut out a lot of carbohydrates, sodas, and fried foods. She also reports a headache that started 4 days ago. It is right-sided and feels like a pressure sensation. She states it throbs at times. She denies nausea or vomiting. She states she's been taking ibuprofen 800 mg every 4 hours without relief. We discussed that is too much to take. She denies any numbness or tingling in her foot. She states she was diagnosed with diabetes in September 2016. She states up to now it has not been hard to control. She states the only thing it's different she has had some torn tendons in her left foot and she has been wearing a boot at work. She states she still has a lot of pain from that. She has seen a podiatrist about her foot.    PCP DR Othelia Pulling, [facility]  Past Medical History  Diagnosis Date  . Edema   . Migraine headache   . Anemia     iron deficiency  . Diabetes [facility])    Past Surgical History  Procedure Laterality Date  . Abdominal hysterectomy    . Cholecystectomy     Family History  Problem Relation Age of Onset  . Diabetes Mother   . Hypertension Mother   . Diabetes Father   . Hypertension Father   . Hypertension Brother   .  Cancer Maternal Aunt     breast  . Diabetes Maternal Grandmother   . Diabetes Paternal Grandmother    Social History  Substance Use Topics  . Smoking status: Current Some Day Smoker -- 0.50 packs/day    Types: Cigarettes  . Smokeless tobacco: None  . Alcohol Use: No   employed  OB History    No data available     Review of Systems  All other systems reviewed and are negative.     Allergies  Review of patient's allergies indicates no known allergies.  Home Medications   Prior to Admission medications   Medication Sig Start Date End Date Taking? Authorizing Provider  BYETTA 5 MCG PEN 5 MCG/0.02ML SOPN injection Inject 5 mcg into the skin 2 (two) times daily with a meal.  11/03/15  Yes Historical Provider, MD  gabapentin (NEURONTIN) 300 MG capsule Take 300 mg by mouth 2 (two) times daily.    Yes Historical Provider, MD  hydrochlorothiazide (HYDRODIURIL) 12.5 MG tablet Take 1 tablet (12.5 mg total) by mouth daily. 02/12/13  Yes Camelia Eng Tysinger, PA-C  metFORMIN (GLUCOPHAGE-XR) 500 MG 24 hr tablet Take 2 tablets (1,000 mg total) by mouth 2 (two) times daily. 03/10/16  Yes [name], MD  HYDROcodone-acetaminophen (NORCO/VICODIN) 5-325 MG tablet Take one tab po q 4-6 hrs prn pain Patient not  taking: Reported on 03/10/2016 12/20/15   [name], PA-C  naproxen (NAPROSYN) 500 MG tablet Take 1 tablet (500 mg total) by mouth 2 (two) times daily with a meal. Patient not taking: Reported on 03/10/2016 12/20/15   [name], PA-C  nortriptyline (PAMELOR) 25 MG capsule 1 tablet at night for one week then 2 tablets every night Patient taking differently: Take 50 mg by mouth at bedtime.  09/16/15   [name], MD   BP 110/76 mmHg  Pulse 91  Temp(Src) 98 F (36.7 C) (Oral)  Resp 18  Ht 5\' 7"  (1.702 m)  Wt 210 lb (95.255 kg)  BMI 32.88 kg/m2  SpO2 100%  Vital signs normal   Physical Exam  Constitutional: She is oriented to person, place, and time. She appears well-developed and  well-nourished.  Non-toxic appearance. She does not appear ill. No distress.  HENT:  Head: Normocephalic and atraumatic.  Right Ear: External ear normal.  Left Ear: External ear normal.  Nose: Nose normal. No mucosal edema or rhinorrhea.  Mouth/Throat: Mucous membranes are normal. No dental abscesses or uvula swelling.  Dry tongue  Eyes: Conjunctivae and EOM are normal. Pupils are equal, round, and reactive to light.  Neck: Normal range of motion and full passive range of motion without pain. Neck supple.  Cardiovascular: Normal rate, regular rhythm and normal heart sounds.  Exam reveals no gallop and no friction rub.   No murmur heard. Pulmonary/Chest: Effort normal and breath sounds normal. No respiratory distress. She has no wheezes. She has no rhonchi. She has no rales. She exhibits no tenderness and no crepitus.  Abdominal: Soft. Normal appearance and bowel sounds are normal. She exhibits no distension. There is no tenderness. There is no rebound and no guarding.  Musculoskeletal: Normal range of motion. She exhibits no edema or tenderness.  Moves all extremities well.   Neurological: She is alert and oriented to person, place, and time. She has normal strength. No cranial nerve deficit.  Skin: Skin is warm, dry and intact. No rash noted. No erythema. No pallor.  Psychiatric: She has a normal mood and affect. Her speech is normal and behavior is normal. Her mood appears not anxious.  Nursing note and vitals reviewed.   ED Course  Procedures (including critical care time)  Medications  insulin aspart (novoLOG) injection 5 Units (not administered)  sodium chloride 0.9 % bolus 1,000 mL (0 mLs Intravenous Stopped 03/24/16 0608)  sodium chloride 0.9 % bolus 1,000 mL (1,000 mLs Intravenous New Bag/Given 03/24/16 0514)  insulin aspart (novoLOG) injection 5 Units (5 Units Intravenous Given 03/24/16 0518)  ketorolac (TORADOL) 30 MG/ML injection 30 mg (30 mg Intravenous Given 03/24/16 0524)     Patient was given IV fluids. She was given IV insulin.   Recheck it 6 AM patient is feeling better. Her CBG slowly improving. Her last CBG prior to discharge was 310. She was given insulin subcutaneous to go home. We discussed really watching her diet. She was given diabetic diet information. We also discussed following up with her doctor. She may need her medication adjusted or a new agent if her diabetes continues to be difficult to control. Her headache improved with the IV fluids and Toradol.  Labs Review Results for orders placed or performed during the hospital encounter of [id number]  Basic metabolic panel  Result Value Ref Range   Sodium 135 135 - 145 mmol/L   Potassium 4.0 3.5 - 5.1 mmol/L   Chloride 104 101 - 111 mmol/L  CO2 22 22 - 32 mmol/L   Glucose, Bld 459 (H) 65 - 99 mg/dL   BUN 10 6 - 20 mg/dL   Creatinine, Ser 0.58 0.44 - 1.00 mg/dL   Calcium 9.8 8.9 - 10.3 mg/dL   GFR calc non Af Amer >60 >60 mL/min   GFR calc Af Amer >60 >60 mL/min   Anion gap 9 5 - 15  Urinalysis, Routine w reflex microscopic (not at [facility])  Result Value Ref Range   Color, Urine YELLOW YELLOW   APPearance CLEAR CLEAR   Specific Gravity, Urine 1.010 1.005 - 1.030   pH 6.0 5.0 - 8.0   Glucose, UA >1000 (A) NEGATIVE mg/dL   Hgb urine dipstick NEGATIVE NEGATIVE   Bilirubin Urine NEGATIVE NEGATIVE   Ketones, ur NEGATIVE NEGATIVE mg/dL   Protein, ur NEGATIVE NEGATIVE mg/dL   Nitrite NEGATIVE NEGATIVE   Leukocytes, UA NEGATIVE NEGATIVE  Urine microscopic-add on  Result Value Ref Range   Squamous Epithelial / LPF NONE SEEN NONE SEEN   WBC, UA NONE SEEN 0 - 5 WBC/hpf   RBC / HPF NONE SEEN 0 - 5 RBC/hpf   Bacteria, UA NONE SEEN NONE SEEN  CBG monitoring, ED  Result Value Ref Range   Glucose-Capillary 431 (H) 65 - 99 mg/dL   Comment 1 Notify RN    Comment 2 Document in Chart   POC CBG, ED  Result Value Ref Range   Glucose-Capillary 354 (H) 65 - 99 mg/dL   Comment 1 Document in Chart   CBG  monitoring, ED  Result Value Ref Range   Glucose-Capillary 310 (H) 65 - 99 mg/dL   Comment 1 Notify RN    Comment 2 Document in Chart    Laboratory interpretation all normal except hyperglycemia that improved with IV fluids and insulin, no acidosis or DKA, glucosuria     Imaging Review No results found. I have personally reviewed and evaluated these images and lab results as part of my medical decision-making.    MDM   Final diagnoses:  Hyperglycemia  Dehydration  Headache, unspecified headache type    Plan discharge  Rolland Porter, MD, Barbette Or, MD 03/24/16 [id number]

## 2016-03-24 NOTE — ED Notes (Signed)
Pt states her blood sugar has been running high, states it was off the scale at home this am.  Pt also c/o headache.

## 2016-03-24 NOTE — ED Notes (Signed)
Pt states she has been waking up every hour to use the restroom & has been very thirsty the past few days. Pt states was seen in the ER 2 weeks ago for the same.

## 2016-03-24 NOTE — ED Notes (Signed)
Patient ambulatory to restroom with no assistance. Patient voided.

## 2016-03-24 NOTE — ED Notes (Signed)
EDP at bedside  

## 2016-07-14 ENCOUNTER — Ambulatory Visit
Admission: RE | Admit: 2016-07-14 | Discharge: 2016-07-14 | Disposition: A | Discharge status: Home / Self Care | Payer: BLUE CROSS/BLUE SHIELD | Source: Ambulatory Visit | Attending: Nurse Practitioner | Admitting: Nurse Practitioner

## 2016-07-14 ENCOUNTER — Other Ambulatory Visit: Payer: Self-pay | Admitting: Nurse Practitioner

## 2016-07-14 DIAGNOSIS — R1084 Generalized abdominal pain: Secondary | ICD-10-CM

## 2016-07-14 IMAGING — CR DG ABDOMEN 1V
1 series · 1 of 1 positions shown · non-contrast
Comparison: [DATE]

CLINICAL DATA: Abdominal pain for several weeks

EXAM:
ABDOMEN - 1 VIEW

[t abdomen supine]
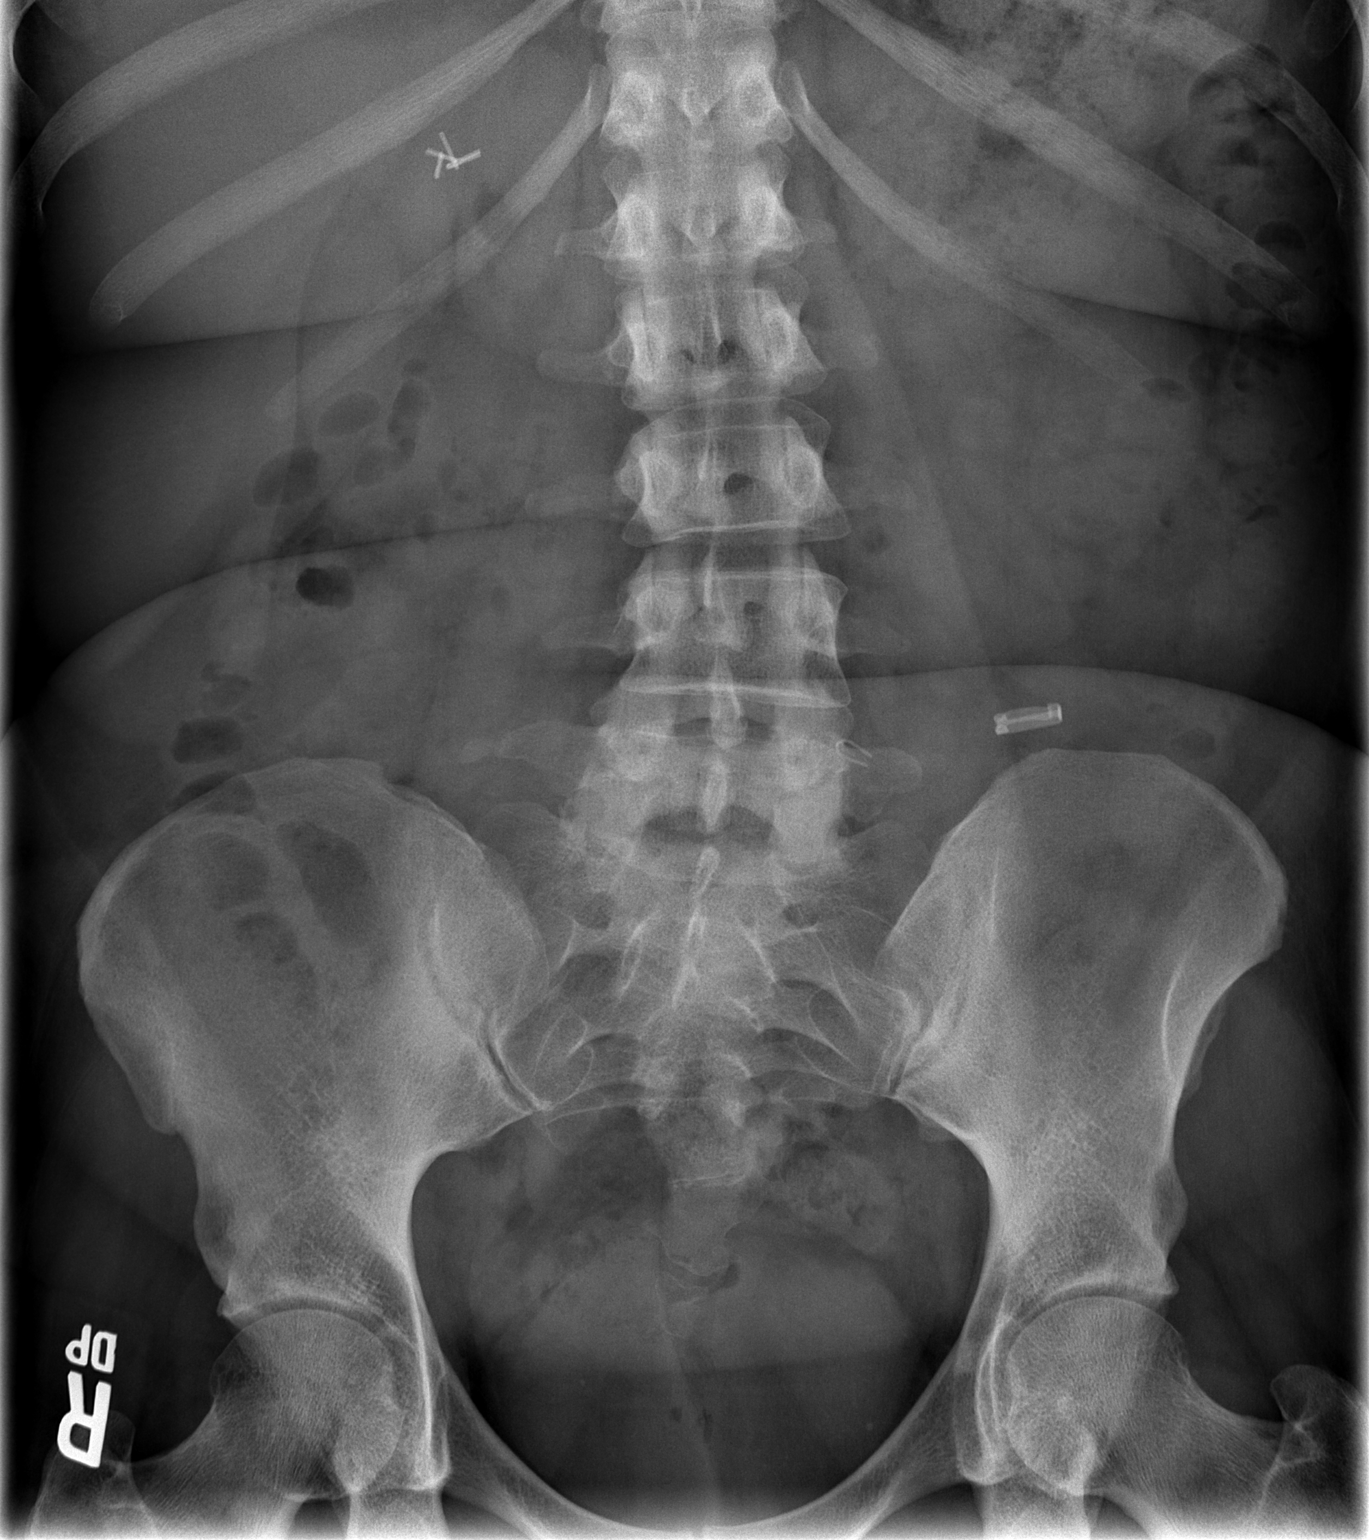

[1 of 1 positions shown; findings below may reference images not displayed]

FINDINGS: There is normal small bowel gas pattern. Postcholecystectomy
surgical clips are noted. Mild levoscoliosis lumbar spine. Surgical
clip is noted left lower abdomen.
IMPRESSION: Normal small bowel gas pattern.  Mild lumbar levoscoliosis.

## 2016-11-20 ENCOUNTER — Emergency Department (HOSPITAL_COMMUNITY)
Admission: EM | Admit: 2016-11-20 | Discharge: 2016-11-21 | Disposition: A | Discharge status: Home / Self Care | Payer: Self-pay | Attending: Emergency Medicine | Admitting: Emergency Medicine

## 2016-11-20 ENCOUNTER — Encounter (HOSPITAL_COMMUNITY): Payer: Self-pay

## 2016-11-20 ENCOUNTER — Emergency Department (HOSPITAL_COMMUNITY): Payer: Self-pay

## 2016-11-20 DIAGNOSIS — E119 Type 2 diabetes mellitus without complications: Secondary | ICD-10-CM | POA: Insufficient documentation

## 2016-11-20 DIAGNOSIS — M25562 Pain in left knee: Secondary | ICD-10-CM | POA: Insufficient documentation

## 2016-11-20 DIAGNOSIS — F17210 Nicotine dependence, cigarettes, uncomplicated: Secondary | ICD-10-CM | POA: Insufficient documentation

## 2016-11-20 DIAGNOSIS — Z79899 Other long term (current) drug therapy: Secondary | ICD-10-CM | POA: Insufficient documentation

## 2016-11-20 DIAGNOSIS — I10 Essential (primary) hypertension: Secondary | ICD-10-CM | POA: Insufficient documentation

## 2016-11-20 DIAGNOSIS — Z7984 Long term (current) use of oral hypoglycemic drugs: Secondary | ICD-10-CM | POA: Insufficient documentation

## 2016-11-20 IMAGING — DX DG KNEE COMPLETE 4+V*L*
4 series · 4 of 4 positions shown · non-contrast
Comparison: None.

CLINICAL DATA: 40-year-old female with knee pain.  No injury.

EXAM:
LEFT KNEE - COMPLETE 4+ VIEW

[knee ap (1 of 3)]
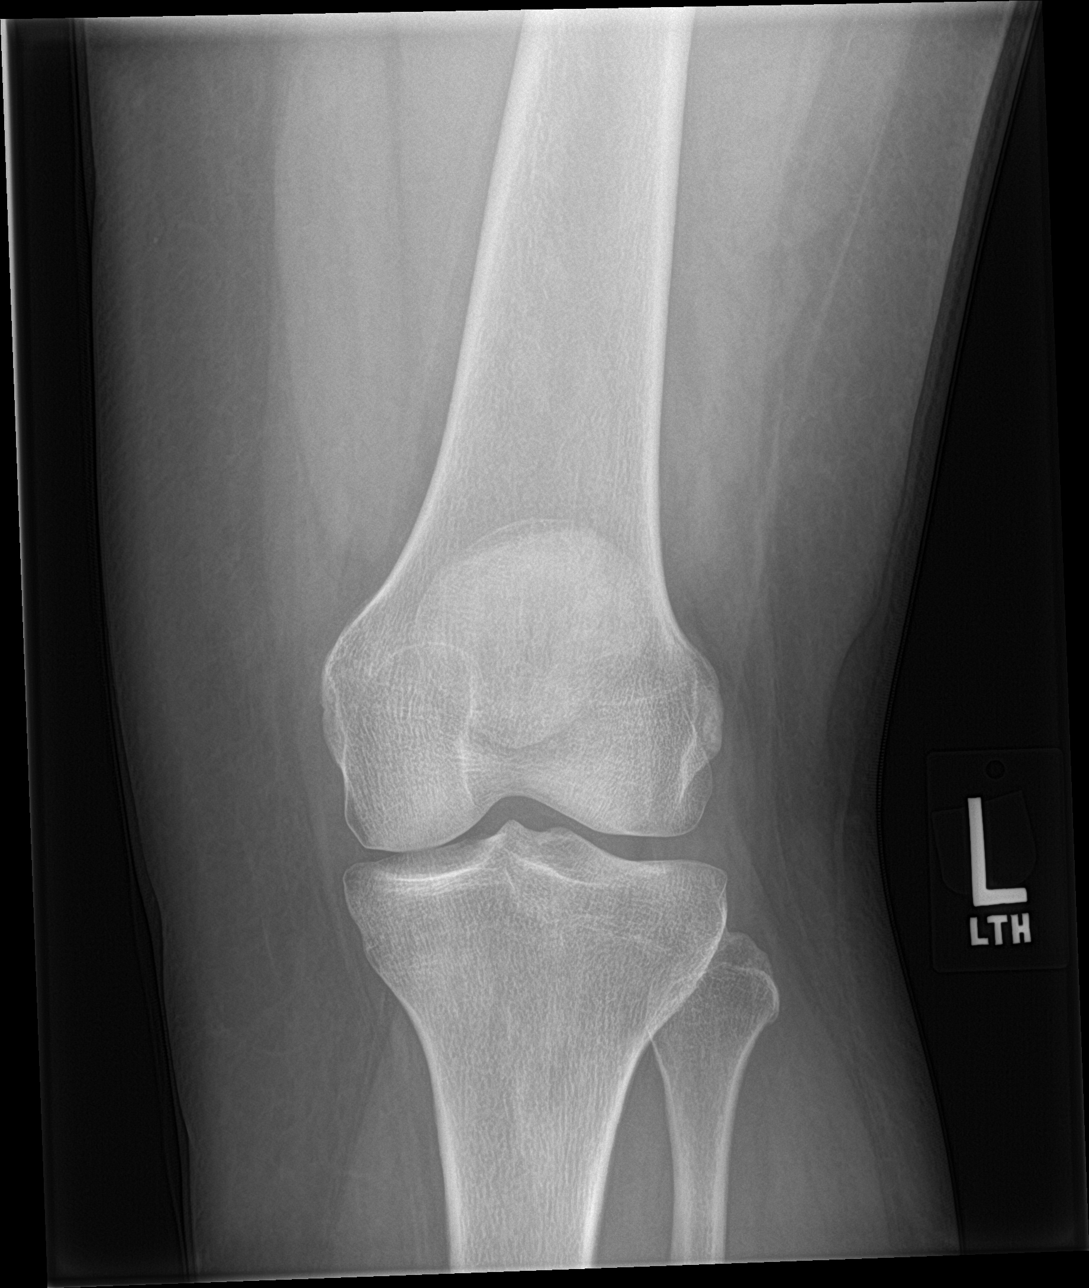

[knee lat]
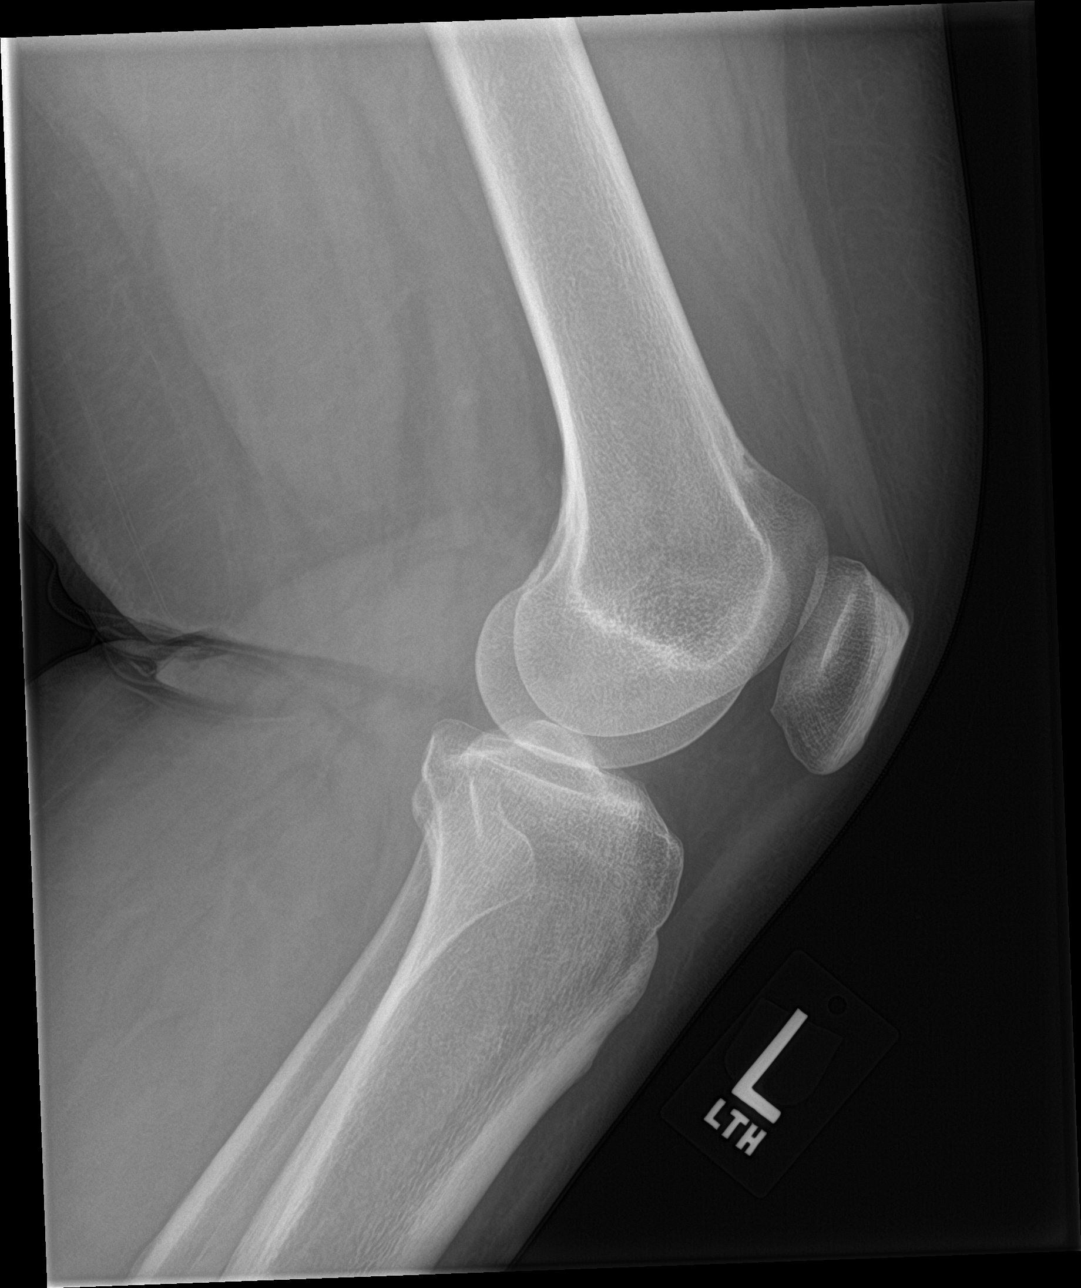

[knee ap (2 of 3)]
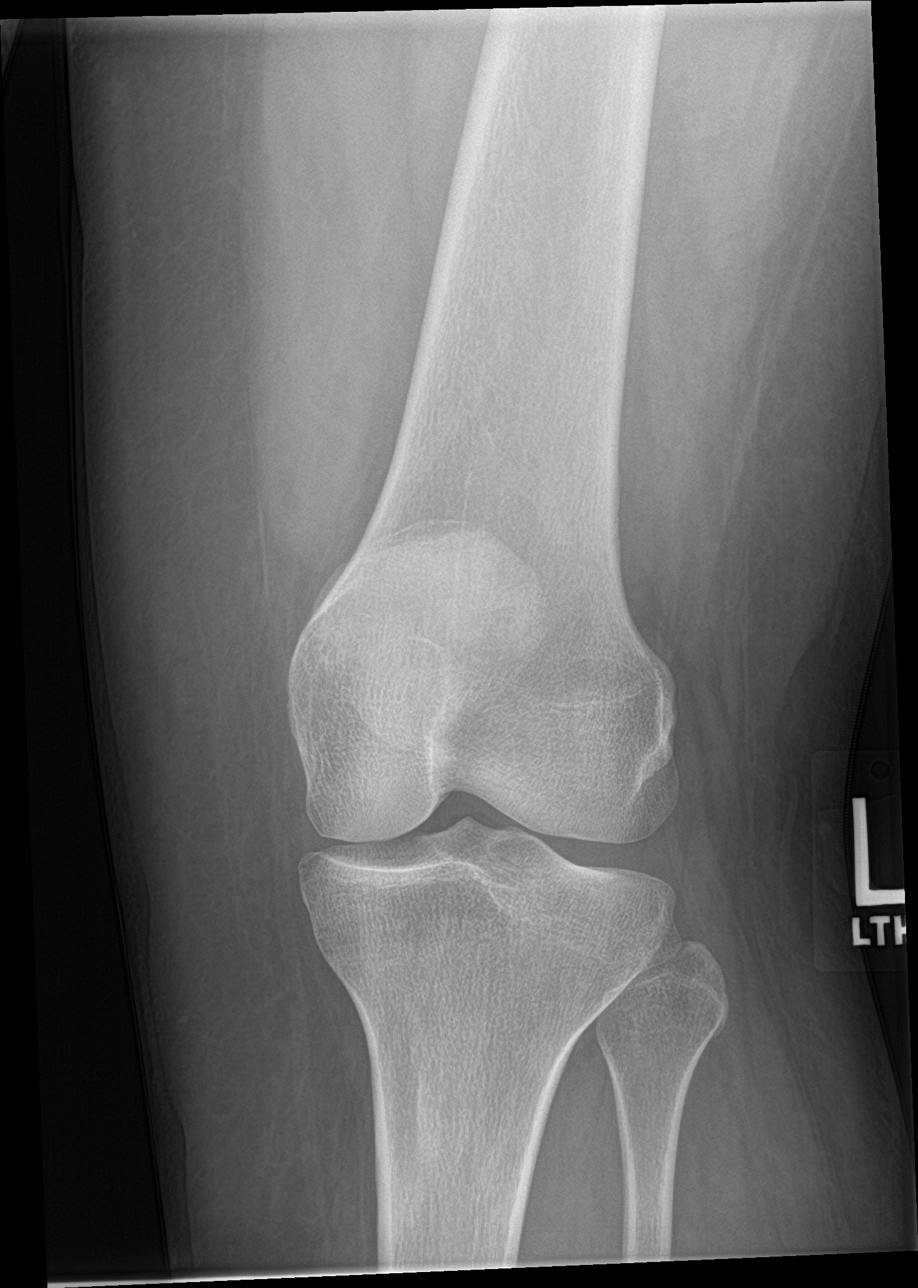

[knee ap (3 of 3)]
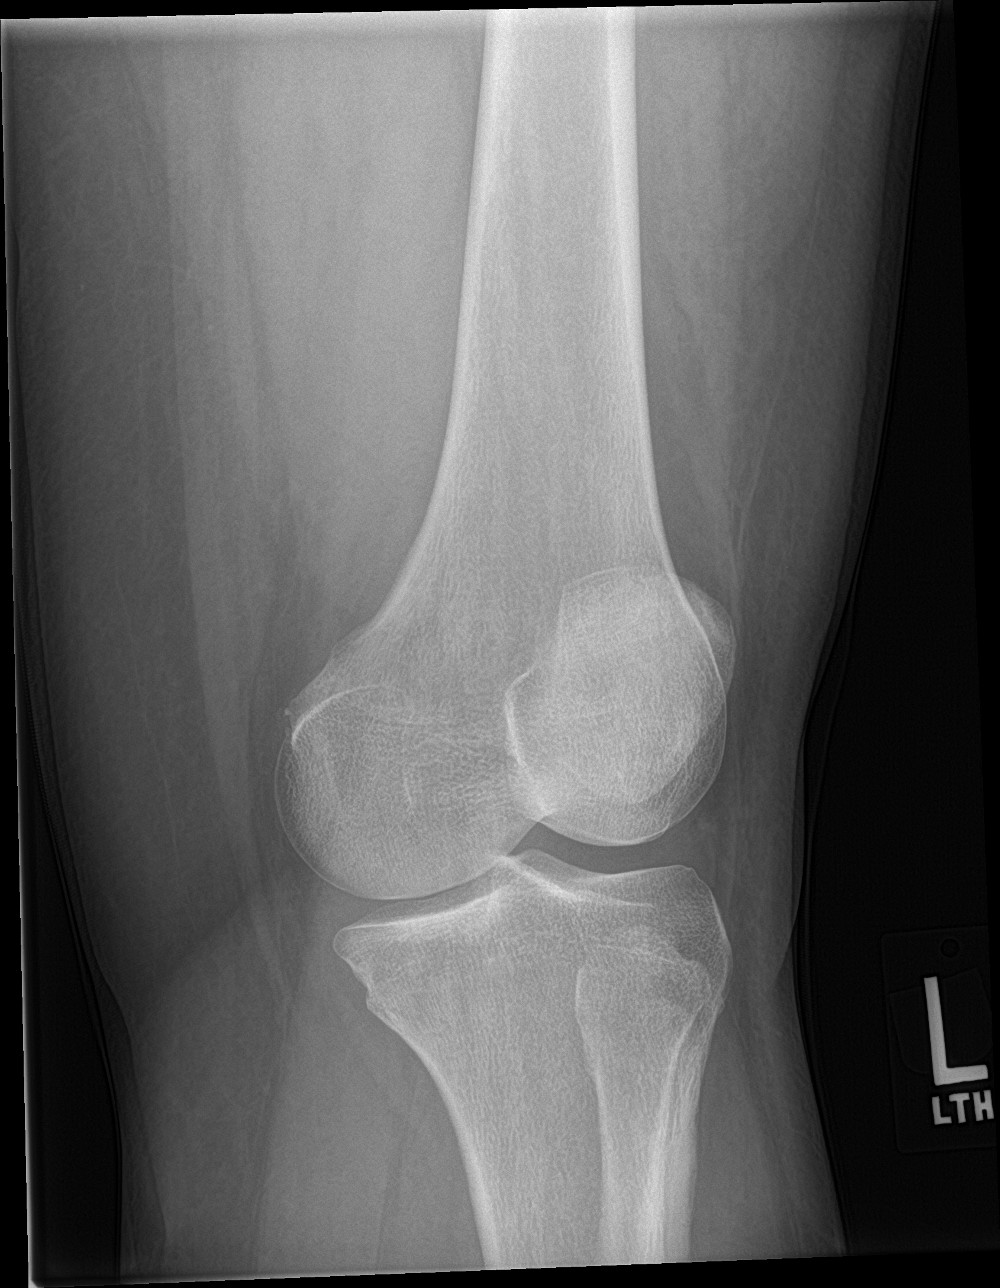

[4 of 4 positions shown; findings below may reference images not displayed]

FINDINGS: No evidence of fracture, dislocation, or joint effusion. No evidence
of arthropathy or other focal bone abnormality. Soft tissues are
unremarkable.
IMPRESSION: Negative.

## 2016-11-20 MED ORDER — NAPROXEN 250 MG PO TABS: 500.0000 mg | ORAL_TABLET | Freq: Once | ORAL | Status: DC

## 2016-11-20 MED ORDER — IBUPROFEN 800 MG PO TABS
800.0000 mg | ORAL_TABLET | Freq: Once | ORAL | Status: AC
  Administered 2016-11-20: 800 mg via ORAL
  Filled 2016-11-20: qty 1

## 2016-11-20 MED ORDER — NAPROXEN 500 MG PO TABS: 500.0000 mg | ORAL_TABLET | Freq: Two times a day (BID) | ORAL | 0 refills | Status: DC

## 2016-11-20 NOTE — ED Notes (Signed)
Pt states left knee pain started a few weeks ago & pain has gotten really bad now, unable to sleep. Pt say had to leave work early due to pain.

## 2016-11-20 NOTE — Discharge Instructions (Signed)
Your xrays are negative today as I suspected they might be.  Your exam suggests a knee ligament strain.  Use the knee sleeve to protect the joint.  Apply heat to your knee 20 minutes several times daily.  Call Dr. Aline Brochure for further evaluation if your symptoms are not improving over the next week.

## 2016-11-20 NOTE — ED Provider Notes (Signed)
[place] DEPT Provider Note   CSN: [id number] Arrival date & time: 11/20/16  2145     History   Chief Complaint Chief Complaint  Patient presents with  . Knee Pain    HPI Virginia Douglas is a 41 y.o. female with no prior history of knee pain or injury presenting with a 2 week history of constant aching pain in the left knee and is worsened with weight bearing and flexing the joint.  She has also had popping sensation intermittently with certain movements, and is especially noticeable when going up a flight of steps.  Her started gradually and she does not recognize a specific injury.  She has used aleve and ibuprofen along with a spray on muscle rub product without relief.  She denies radiation of pain, denies fevers, swelling and other joint pain.  The history is provided by the patient.    Past Medical History:  Diagnosis Date  . Anemia    iron deficiency  . Diabetes ([place])   . Edema   . Migraine headache     Patient Active Problem List   Diagnosis Date Noted  . Diabetes ([place]) 12/08/2015  . Hypertension 12/08/2015  . Smoker 12/08/2015  . LLQ pain 12/08/2015  . Paresthesia 09/16/2015  . Urinary urgency 09/16/2015    Past Surgical History:  Procedure Laterality Date  . ABDOMINAL HYSTERECTOMY    . CHOLECYSTECTOMY      OB History    No data available       Home Medications    Prior to Admission medications   Medication Sig Start Date End Date Taking? Authorizing Provider  BYETTA 5 MCG PEN 5 MCG/0.02ML SOPN injection Inject 5 mcg into the skin 2 (two) times daily with a meal.  11/03/15   Historical Provider, MD  gabapentin (NEURONTIN) 300 MG capsule Take 300 mg by mouth 2 (two) times daily.     Historical Provider, MD  hydrochlorothiazide (HYDRODIURIL) 12.5 MG tablet Take 1 tablet (12.5 mg total) by mouth daily. 02/12/13   Carlena Hurl, PA-C  HYDROcodone-acetaminophen (NORCO/VICODIN) 5-325 MG tablet Take one tab po q 4-6 hrs prn pain Patient not  taking: Reported on 03/10/2016 12/20/15   [name], PA-C  metFORMIN (GLUCOPHAGE-XR) 500 MG 24 hr tablet Take 2 tablets (1,000 mg total) by mouth 2 (two) times daily. 03/10/16   [name], MD  naproxen (NAPROSYN) 500 MG tablet Take 1 tablet (500 mg total) by mouth 2 (two) times daily. 11/20/16   Evalee Jefferson, PA-C  nortriptyline (PAMELOR) 25 MG capsule 1 tablet at night for one week then 2 tablets every night Patient taking differently: Take 50 mg by mouth at bedtime.  09/16/15   [name], MD    Family History Family History  Problem Relation Age of Onset  . Diabetes Mother   . Hypertension Mother   . Diabetes Father   . Hypertension Father   . Hypertension Brother   . Diabetes Maternal Grandmother   . Diabetes Paternal Grandmother   . Cancer Maternal Aunt     breast    Social History Social History  Substance Use Topics  . Smoking status: Current Some Day Smoker    Packs/day: 0.50    Types: Cigarettes  . Smokeless tobacco: Never Used  . Alcohol use No     Allergies   Patient has no known allergies.   Review of Systems Review of Systems  Constitutional: Negative for fever.  Musculoskeletal: Positive for arthralgias. Negative for joint swelling  and myalgias.  Neurological: Negative for weakness and numbness.     Physical Exam Updated Vital Signs BP 114/73 (BP Location: Left Arm)   Pulse 91   Temp 98.1 F (36.7 C) (Oral)   Resp 18   Ht 5\' 7"  (1.702 m)   Wt 93 kg   SpO2 99%   BMI 32.11 kg/m   Physical Exam  Constitutional: She appears well-developed and well-nourished.  HENT:  Head: Atraumatic.  Neck: Normal range of motion.  Cardiovascular:  Pulses equal bilaterally  Musculoskeletal: She exhibits tenderness.       Left knee: She exhibits no swelling, no effusion, no erythema, normal alignment, no LCL laxity and no MCL laxity. Tenderness found.  ttp lateral to the patellar ligament along the tibial meniscus.  No palpable deformity.  Reproducible  crepitus with posterior drawer but no increased laxity.  Neurological: She is alert. She has normal strength. She displays normal reflexes. No sensory deficit.  Skin: Skin is warm and dry.  Psychiatric: She has a normal mood and affect.     ED Treatments / Results  Labs (all labs ordered are listed, but only abnormal results are displayed) Labs Reviewed - No data to display  EKG  EKG Interpretation None       Radiology Dg Knee Complete 4 Views Left  Result Date: 11/20/2016 CLINICAL DATA:  41 year old female with knee pain.  No injury. EXAM: LEFT KNEE - COMPLETE 4+ VIEW COMPARISON:  None. FINDINGS: No evidence of fracture, dislocation, or joint effusion. No evidence of arthropathy or other focal bone abnormality. Soft tissues are unremarkable. IMPRESSION: Negative. Electronically Signed   By: Anner Crete M.D.   On: 11/20/2016 23:28    Procedures Procedures (including critical care time)  Medications Ordered in ED Medications  ibuprofen (ADVIL,MOTRIN) tablet 800 mg (800 mg Oral Given 11/20/16 2320)     Initial Impression / Assessment and Plan / ED Course  I have reviewed the triage vital signs and the nursing notes.  Pertinent labs & imaging results that were available during my care of the patient were reviewed by me and considered in my medical decision making (see chart for details).     Imaging reviewed, exam suggests either meniscal trauma or ACL strain. Knee sleeve, heat, elevation.  Referral to ortho prn if sx persist.  No edema or erythema suggesting infection/gout .   Final Clinical Impressions(s) / ED Diagnoses   Final diagnoses:  Acute pain of left knee    New Prescriptions New Prescriptions   NAPROXEN (NAPROSYN) 500 MG TABLET    Take 1 tablet (500 mg total) by mouth 2 (two) times daily.     Evalee Jefferson, PA-C 11/20/16 [place], MD 11/21/16 1150

## 2016-11-20 NOTE — ED Triage Notes (Signed)
Left knee pain x2 weeks. Denies injury.

## 2016-11-21 NOTE — ED Notes (Signed)
Pt alert & oriented x4, stable gait. Patient given discharge instructions, paperwork & prescription(s). Patient  instructed to stop at the registration desk to finish any additional paperwork. Patient verbalized understanding. Pt left department w/ no further questions. 

## 2016-12-27 DIAGNOSIS — J309 Allergic rhinitis, unspecified: Secondary | ICD-10-CM | POA: Diagnosis not present

## 2016-12-27 DIAGNOSIS — Z8371 Family history of colonic polyps: Secondary | ICD-10-CM | POA: Diagnosis not present

## 2016-12-27 DIAGNOSIS — E1165 Type 2 diabetes mellitus with hyperglycemia: Secondary | ICD-10-CM | POA: Diagnosis not present

## 2017-01-04 DIAGNOSIS — R1319 Other dysphagia: Secondary | ICD-10-CM | POA: Diagnosis not present

## 2017-01-04 DIAGNOSIS — K219 Gastro-esophageal reflux disease without esophagitis: Secondary | ICD-10-CM | POA: Diagnosis not present

## 2017-01-04 DIAGNOSIS — K625 Hemorrhage of anus and rectum: Secondary | ICD-10-CM | POA: Diagnosis not present

## 2017-02-09 DIAGNOSIS — K6389 Other specified diseases of intestine: Secondary | ICD-10-CM | POA: Diagnosis not present

## 2017-02-09 DIAGNOSIS — Z1211 Encounter for screening for malignant neoplasm of colon: Secondary | ICD-10-CM | POA: Diagnosis not present

## 2017-02-09 DIAGNOSIS — K635 Polyp of colon: Secondary | ICD-10-CM | POA: Diagnosis not present

## 2017-02-09 DIAGNOSIS — Z800 Family history of malignant neoplasm of digestive organs: Secondary | ICD-10-CM | POA: Diagnosis not present

## 2017-02-09 DIAGNOSIS — B3781 Candidal esophagitis: Secondary | ICD-10-CM | POA: Diagnosis not present

## 2017-02-09 DIAGNOSIS — R1310 Dysphagia, unspecified: Secondary | ICD-10-CM | POA: Diagnosis not present

## 2017-02-09 DIAGNOSIS — D125 Benign neoplasm of sigmoid colon: Secondary | ICD-10-CM | POA: Diagnosis not present

## 2017-02-22 DIAGNOSIS — K219 Gastro-esophageal reflux disease without esophagitis: Secondary | ICD-10-CM | POA: Diagnosis not present

## 2017-02-22 DIAGNOSIS — B3781 Candidal esophagitis: Secondary | ICD-10-CM | POA: Diagnosis not present

## 2017-02-28 DIAGNOSIS — Z719 Counseling, unspecified: Secondary | ICD-10-CM | POA: Diagnosis not present

## 2017-03-05 IMAGING — US US ABDOMEN COMPLETE
1 series · 14 of 25 positions shown · non-contrast
Comparison: Abdominal CT [DATE]

CLINICAL DATA: History of stomach burning with nausea and vomiting.

EXAM:
ABDOMEN ULTRASOUND COMPLETE

[Series 1: us abdomen complete · 0.23mm/px · 14 of 71 slices shown]
[im 1/71]
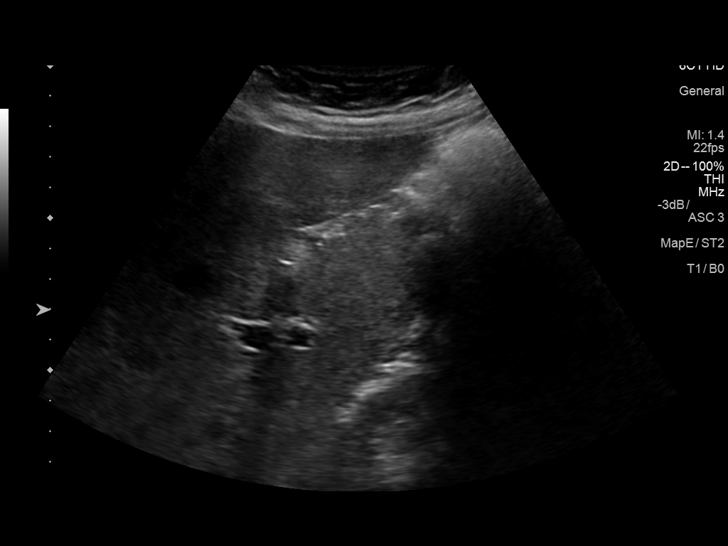
[im 6/71]
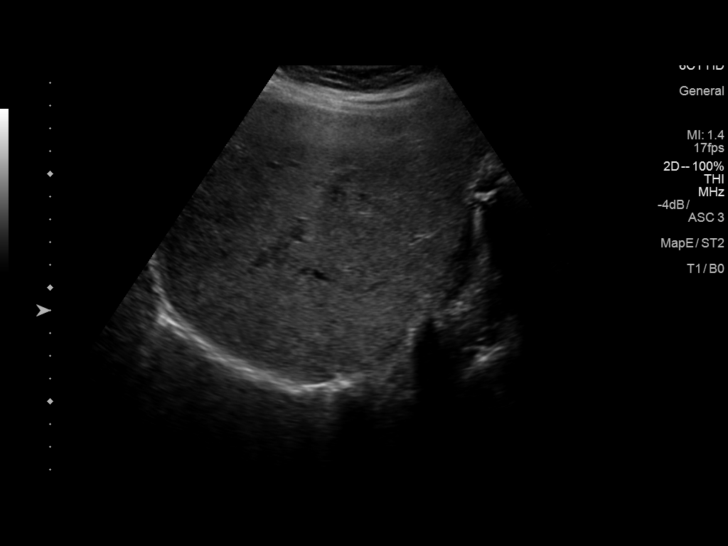
[im 12/71]
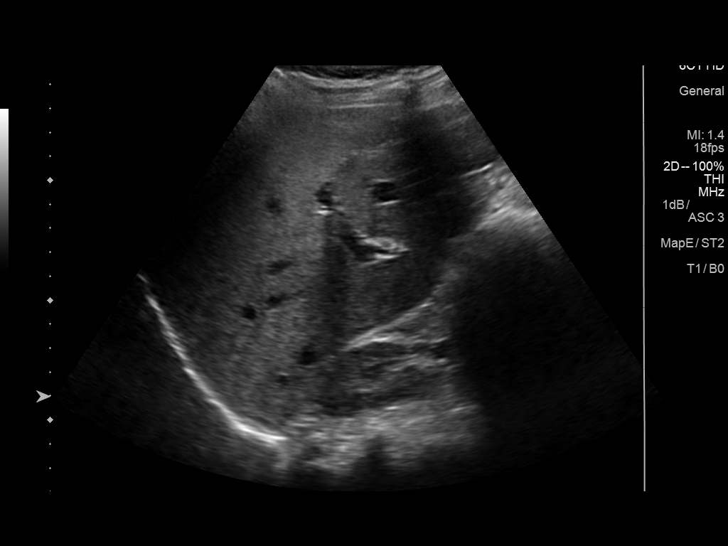
[im 18/71]
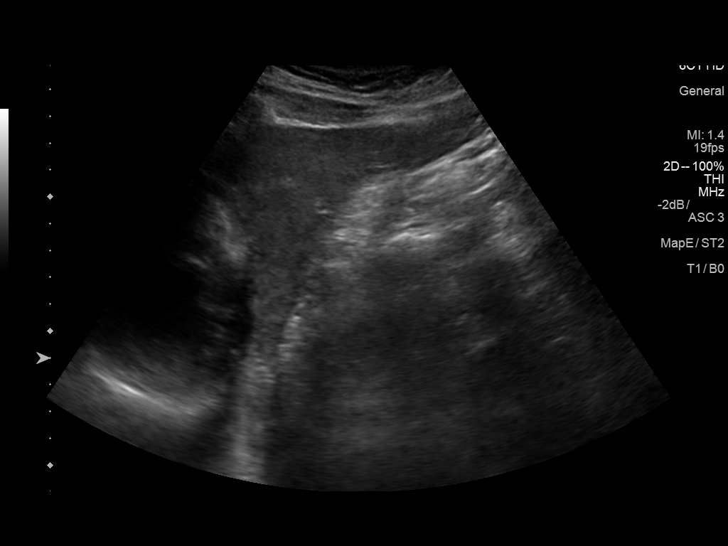
[im 24/71]
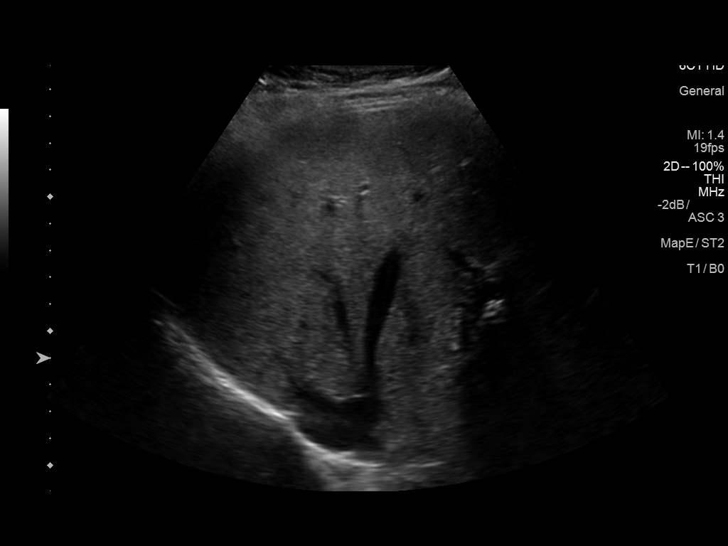
[im 27/71]
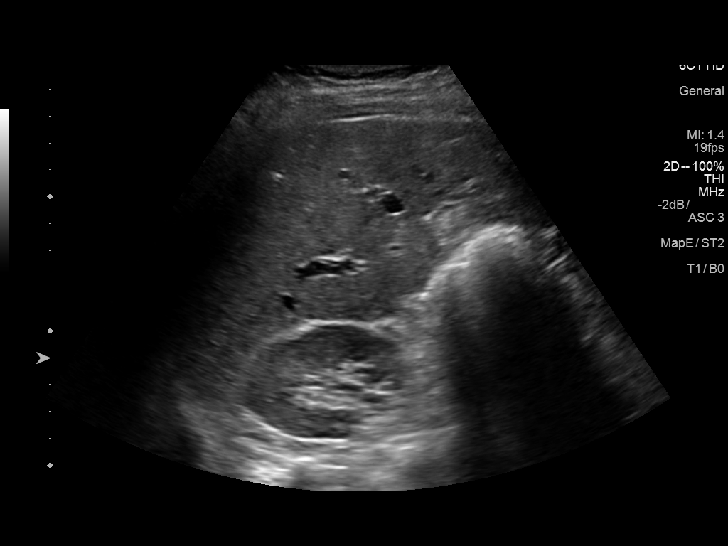
[im 33/71]
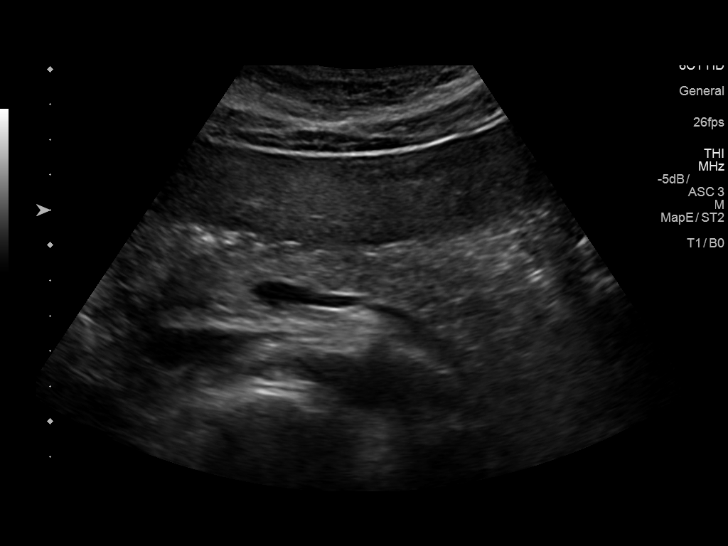
[im 38/71]
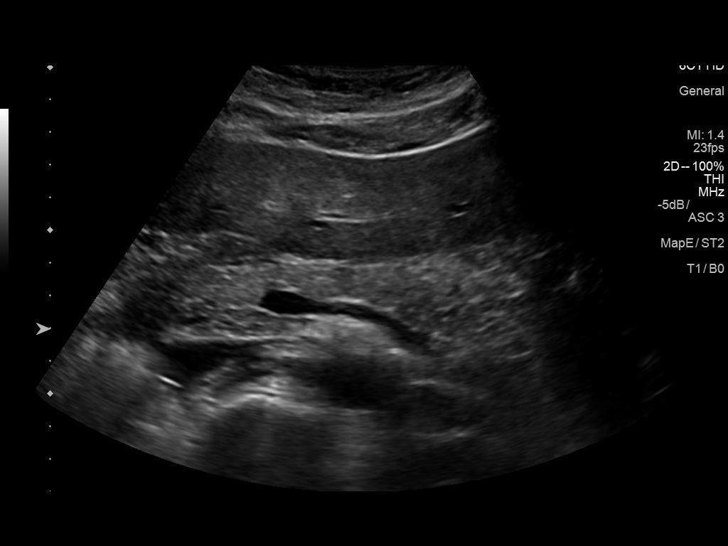
[im 44/71]
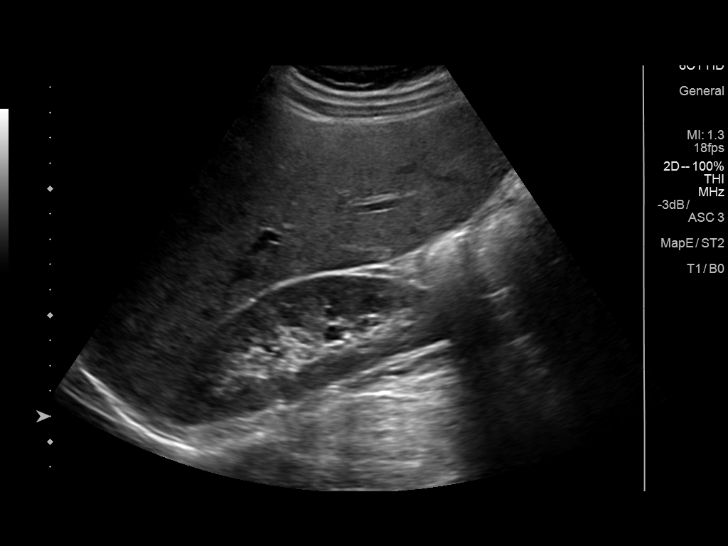
[im 47/71]
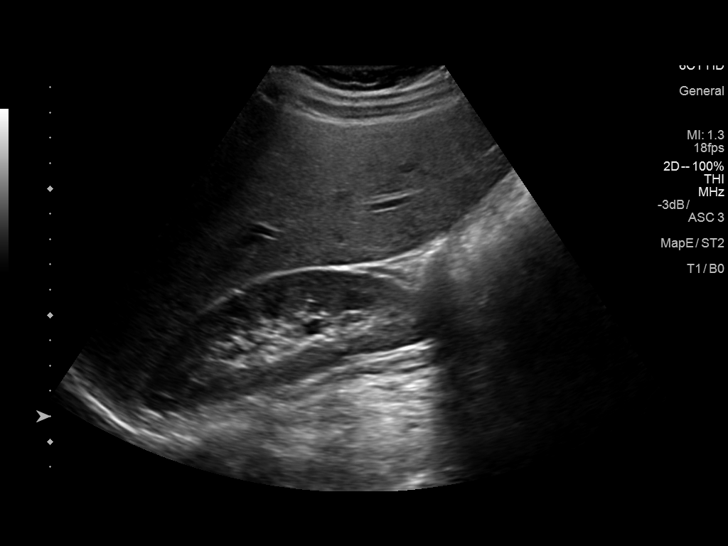
[im 53/71]
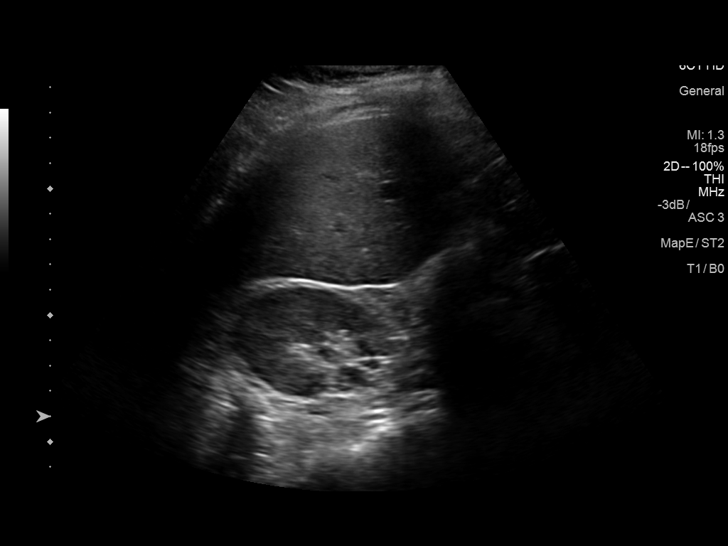
[im 59/71]
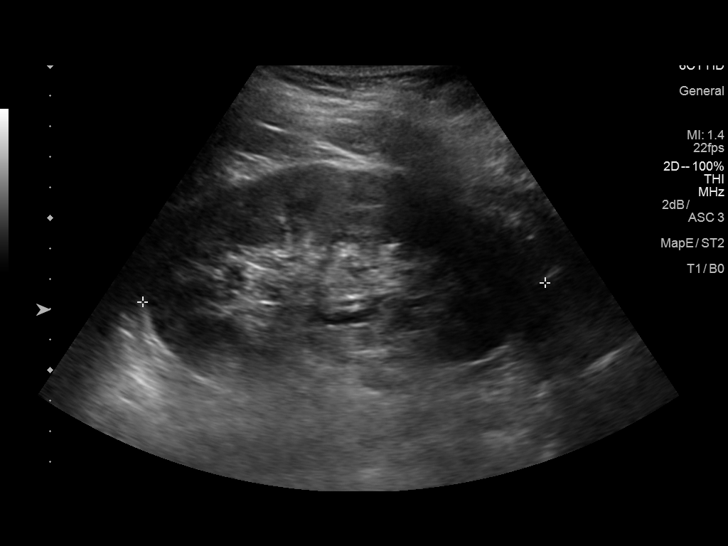
[im 65/71]
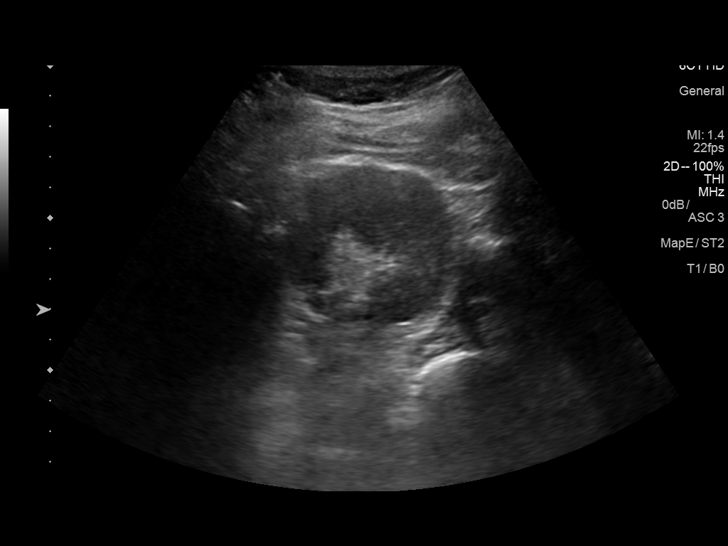
[im 71/71]
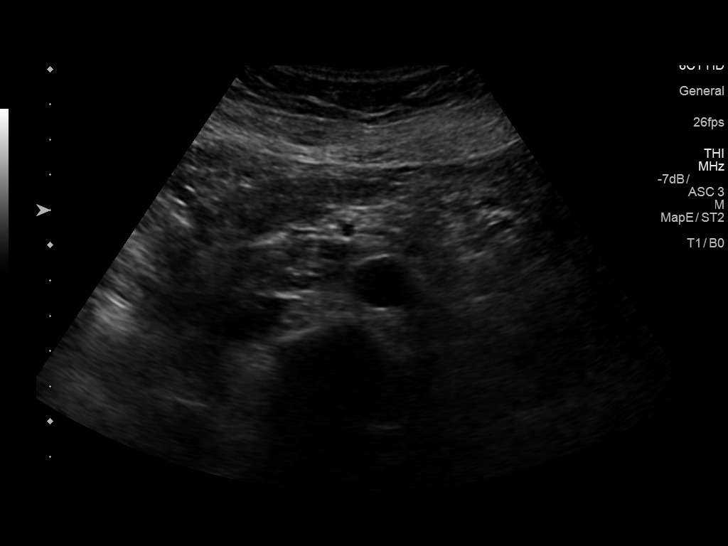

[14 of 25 positions shown; findings below may reference images not displayed]

FINDINGS: Gallbladder: Surgically absent

Common bile duct: Diameter: 4 mm. Where visualized, no filling
defect.

Liver: No focal lesion identified. Within normal limits in
parenchymal echogenicity. Portal vein is patent on color Doppler
imaging with normal direction of blood flow towards the liver.

IVC: No abnormality visualized.

Pancreas: Visualized portion unremarkable.

Spleen: Size and appearance within normal limits.

Right Kidney: Length: 13 cm. Echogenicity within normal limits. No
mass or hydronephrosis visualized.

Left Kidney: Length: 13 cm. Echogenicity within normal limits. No
mass or hydronephrosis visualized.

Abdominal aorta: No aneurysm visualized.
IMPRESSION: Negative abdominal ultrasound after cholecystectomy.

## 2017-03-12 DIAGNOSIS — Z719 Counseling, unspecified: Secondary | ICD-10-CM | POA: Diagnosis not present

## 2017-03-19 DIAGNOSIS — Z719 Counseling, unspecified: Secondary | ICD-10-CM | POA: Diagnosis not present

## 2017-03-30 DIAGNOSIS — E1165 Type 2 diabetes mellitus with hyperglycemia: Secondary | ICD-10-CM | POA: Diagnosis not present

## 2017-04-02 DIAGNOSIS — Z719 Counseling, unspecified: Secondary | ICD-10-CM | POA: Diagnosis not present

## 2017-04-20 ENCOUNTER — Emergency Department (HOSPITAL_COMMUNITY)
Admission: EM | Admit: 2017-04-20 | Discharge: 2017-04-20 | Disposition: A | Discharge status: Home / Self Care | Payer: 59 | Attending: Emergency Medicine | Admitting: Emergency Medicine

## 2017-04-20 ENCOUNTER — Encounter (HOSPITAL_COMMUNITY): Payer: Self-pay | Admitting: Emergency Medicine

## 2017-04-20 DIAGNOSIS — R300 Dysuria: Secondary | ICD-10-CM | POA: Insufficient documentation

## 2017-04-20 DIAGNOSIS — E119 Type 2 diabetes mellitus without complications: Secondary | ICD-10-CM | POA: Diagnosis not present

## 2017-04-20 DIAGNOSIS — R1110 Vomiting, unspecified: Secondary | ICD-10-CM | POA: Diagnosis present

## 2017-04-20 DIAGNOSIS — I10 Essential (primary) hypertension: Secondary | ICD-10-CM | POA: Diagnosis not present

## 2017-04-20 DIAGNOSIS — Z794 Long term (current) use of insulin: Secondary | ICD-10-CM | POA: Insufficient documentation

## 2017-04-20 DIAGNOSIS — R112 Nausea with vomiting, unspecified: Secondary | ICD-10-CM | POA: Diagnosis not present

## 2017-04-20 DIAGNOSIS — F17210 Nicotine dependence, cigarettes, uncomplicated: Secondary | ICD-10-CM | POA: Insufficient documentation

## 2017-04-20 DIAGNOSIS — Z79899 Other long term (current) drug therapy: Secondary | ICD-10-CM | POA: Insufficient documentation

## 2017-04-20 LAB — CBC WITH DIFFERENTIAL/PLATELET
Basophils Absolute: 0 10*3/uL (ref 0.0–0.1)
Basophils Relative: 0 %
Eosinophils Absolute: 0.1 10*3/uL (ref 0.0–0.7)
Eosinophils Relative: 0 %
HCT: 39.5 % (ref 36.0–46.0)
Hemoglobin: 13.7 g/dL (ref 12.0–15.0)
Lymphocytes Relative: 8 %
Lymphs Abs: 1.6 10*3/uL (ref 0.7–4.0)
MCH: 29.4 pg (ref 26.0–34.0)
MCHC: 34.7 g/dL (ref 30.0–36.0)
MCV: 84.8 fL (ref 78.0–100.0)
Monocytes Absolute: 1.2 10*3/uL — ABNORMAL HIGH (ref 0.1–1.0)
Monocytes Relative: 6 %
Neutro Abs: 16.2 10*3/uL — ABNORMAL HIGH (ref 1.7–7.7)
Neutrophils Relative %: 86 %
Platelets: 306 10*3/uL (ref 150–400)
RBC: 4.66 MIL/uL (ref 3.87–5.11)
RDW: 14.1 % (ref 11.5–15.5)
WBC: 19 10*3/uL — ABNORMAL HIGH (ref 4.0–10.5)

## 2017-04-20 LAB — COMPREHENSIVE METABOLIC PANEL
ALT: 17 U/L (ref 14–54)
AST: 19 U/L (ref 15–41)
Albumin: 4.6 g/dL (ref 3.5–5.0)
Alkaline Phosphatase: 109 U/L (ref 38–126)
Anion gap: 12 (ref 5–15)
BUN: 10 mg/dL (ref 6–20)
CO2: 23 mmol/L (ref 22–32)
Calcium: 10.1 mg/dL (ref 8.9–10.3)
Chloride: 106 mmol/L (ref 101–111)
Creatinine, Ser: 0.71 mg/dL (ref 0.44–1.00)
GFR calc Af Amer: >60 mL/min (ref >60)
GFR calc non Af Amer: >60 mL/min (ref >60)
Glucose, Bld: 124 mg/dL — ABNORMAL HIGH (ref 65–99)
Potassium: 3.1 mmol/L — ABNORMAL LOW (ref 3.5–5.1)
Sodium: 141 mmol/L (ref 135–145)
Total Bilirubin: 0.6 mg/dL (ref 0.3–1.2)
Total Protein: 8.1 g/dL (ref 6.5–8.1)

## 2017-04-20 LAB — URINALYSIS, ROUTINE W REFLEX MICROSCOPIC
Bacteria, UA: NONE SEEN
Bilirubin Urine: NEGATIVE
Glucose, UA: NEGATIVE mg/dL
Hgb urine dipstick: NEGATIVE
Ketones, ur: 5 mg/dL — AB
Leukocytes, UA: NEGATIVE
Nitrite: POSITIVE — AB
Protein, ur: NEGATIVE mg/dL
Specific Gravity, Urine: 1.016 (ref 1.005–1.030)
pH: 5 (ref 5.0–8.0)

## 2017-04-20 LAB — CBG MONITORING, ED: Glucose-Capillary: 131 mg/dL — ABNORMAL HIGH (ref 65–99)

## 2017-04-20 LAB — LIPASE, BLOOD: Lipase: 31 U/L (ref 11–51)

## 2017-04-20 MED ORDER — ONDANSETRON 8 MG PO TBDP
8.0000 mg | ORAL_TABLET | Freq: Once | ORAL | Status: AC
  Administered 2017-04-20: 8 mg via ORAL

## 2017-04-20 MED ORDER — ONDANSETRON HCL 4 MG/2ML IJ SOLN
4.0000 mg | Freq: Once | INTRAMUSCULAR | Status: AC
Start: 2017-04-20 — End: 2017-04-20
  Administered 2017-04-20: 4 mg via INTRAVENOUS
  Filled 2017-04-20: qty 2

## 2017-04-20 MED ORDER — SODIUM CHLORIDE 0.9 % IV BOLUS (SEPSIS)
1000.0000 mL | Freq: Once | INTRAVENOUS | Status: AC
  Administered 2017-04-20: 1000 mL via INTRAVENOUS

## 2017-04-20 MED ORDER — ONDANSETRON 8 MG PO TBDP
ORAL_TABLET | ORAL | Status: AC
  Filled 2017-04-20: qty 1

## 2017-04-20 MED ORDER — CEPHALEXIN 500 MG PO CAPS: 500.0000 mg | ORAL_CAPSULE | Freq: Four times a day (QID) | ORAL | 0 refills | Status: DC

## 2017-04-20 MED ORDER — PROMETHAZINE HCL 25 MG PO TABS: 25.0000 mg | ORAL_TABLET | Freq: Four times a day (QID) | ORAL | 0 refills | Status: DC | PRN

## 2017-04-20 NOTE — ED Triage Notes (Signed)
Pt c/o vomiting that started around 1600. Pt states she has vomited at least 8 times. She denies any abd pain or diarrhea.

## 2017-04-20 NOTE — Discharge Instructions (Signed)
Frequent, small sips of fluids then bland diet as tolerated starting tomorrow.  Follow-up with your primary doctor for recheck in one week.  Return here for any worsening symptoms such as persistent vomiting, fever or abdominal pain

## 2017-04-20 NOTE — ED Provider Notes (Signed)
[facility] DEPT Provider Note   CSN: [id number] Arrival date & time: 04/20/17  2022     History   Chief Complaint Chief Complaint  Patient presents with  . Emesis    HPI Virginia Douglas is a 41 y.o. female.  HPI   Virginia Douglas is a 41 y.o. female who presents to the Emergency Department complaining of gradual onset of nausea earlier today and developed vomiting this afternoon around 2:00 pm.  She states that she has vomited multiple times today and unable to keep down fluids.  She has taken one dose of Pepto-Bismol without relief.  States blood sugars have not been elevated recently.  She denies chest pain, fever, chills, diarrhea, abdominal pain, excessive thirst, and bloody vomitus.  No known sick contacts recently.  No new medications.  Past Medical History:  Diagnosis Date  . Anemia    iron deficiency  . Diabetes ([place])   . Edema   . Migraine headache     Patient Active Problem List   Diagnosis Date Noted  . Diabetes ([place]) 12/08/2015  . Hypertension 12/08/2015  . Smoker 12/08/2015  . LLQ pain 12/08/2015  . Paresthesia 09/16/2015  . Urinary urgency 09/16/2015    Past Surgical History:  Procedure Laterality Date  . ABDOMINAL HYSTERECTOMY    . CHOLECYSTECTOMY      OB History    No data available       Home Medications    Prior to Admission medications   Medication Sig Start Date End Date Taking? Authorizing Provider  atorvastatin (LIPITOR) 10 MG tablet Take 10 mg by mouth daily. 03/10/17  Yes [provider]  hydrochlorothiazide (HYDRODIURIL) 12.5 MG tablet Take 1 tablet (12.5 mg total) by mouth daily. 02/12/13  Yes Tysinger, Camelia Eng, PA-C  lisinopril-hydrochlorothiazide (PRINZIDE,ZESTORETIC) 10-12.5 MG tablet Take 1 tablet by mouth daily. 03/10/17  Yes [provider]  metFORMIN (GLUCOPHAGE-XR) 500 MG 24 hr tablet Take 2 tablets (1,000 mg total) by mouth 2 (two) times daily. 03/10/16  Yes Long, Wonda Olds, MD  omeprazole (PRILOSEC) 40  MG capsule Take 1 capsule by mouth daily. 03/08/17  Yes [provider]  phentermine 15 MG capsule Take 1 capsule by mouth daily. 03/30/17  Yes [provider]  BYETTA 5 MCG PEN 5 MCG/0.02ML SOPN injection Inject 5 mcg into the skin 2 (two) times daily with a meal.  11/03/15   [provider]  gabapentin (NEURONTIN) 300 MG capsule Take 300 mg by mouth 2 (two) times daily.     [provider]  HYDROcodone-acetaminophen (NORCO/VICODIN) 5-325 MG tablet Take one tab po q 4-6 hrs prn pain Patient not taking: Reported on 03/10/2016 12/20/15   Annica Marinello, PA-C  naproxen (NAPROSYN) 500 MG tablet Take 1 tablet (500 mg total) by mouth 2 (two) times daily. Patient not taking: Reported on 04/20/2017 11/20/16   Evalee Jefferson, PA-C  nortriptyline (PAMELOR) 25 MG capsule 1 tablet at night for one week then 2 tablets every night Patient not taking: Reported on 04/20/2017 09/16/15   [name], MD  OZEMPIC 1 MG/DOSE SOPN Inject 1 mg into the skin once a week. 03/30/17   [provider]    Family History Family History  Problem Relation Age of Onset  . Diabetes Mother   . Hypertension Mother   . Diabetes Father   . Hypertension Father   . Hypertension Brother   . Diabetes Maternal Grandmother   . Diabetes Paternal Grandmother   . Cancer Maternal Aunt  breast    Social History Social History  Substance Use Topics  . Smoking status: Current Some Day Smoker    Packs/day: 0.50    Types: Cigarettes  . Smokeless tobacco: Never Used  . Alcohol use No     Allergies   Patient has no known allergies.   Review of Systems Review of Systems  Constitutional: Negative for appetite change, chills and fever.  HENT: Negative for sore throat.   Respiratory: Negative for shortness of breath.   Cardiovascular: Negative for chest pain.  Gastrointestinal: Positive for nausea and vomiting. Negative for abdominal distention, abdominal pain, blood in stool and diarrhea.    Genitourinary: Negative for decreased urine volume, difficulty urinating, dysuria and flank pain.  Musculoskeletal: Negative for back pain and myalgias.  Skin: Negative for color change and rash.  Neurological: Negative for dizziness, weakness and numbness.  Hematological: Negative for adenopathy.  Psychiatric/Behavioral: Negative for confusion.  All other systems reviewed and are negative.    Physical Exam Updated Vital Signs BP 131/66 (BP Location: Right Arm)   Pulse (!) 108   Temp 97.8 F (36.6 C) (Oral)   Resp 20   Ht 5\' 7"  (1.702 m)   Wt 92.1 kg (203 lb)   SpO2 99%   BMI 31.79 kg/m   Physical Exam  Constitutional: She is oriented to person, place, and time. She appears well-developed and well-nourished. No distress.  HENT:  Mouth/Throat: Uvula is midline. Mucous membranes are dry. No oral lesions. No oropharyngeal exudate or posterior oropharyngeal edema.  Eyes: Pupils are equal, round, and reactive to light. EOM are normal.  Neck: Normal range of motion. Neck supple.  Cardiovascular: Normal rate, regular rhythm, normal heart sounds and intact distal pulses.   Pulmonary/Chest: Effort normal and breath sounds normal. No respiratory distress.  Abdominal: Soft. She exhibits no distension and no mass. There is no tenderness. There is no rebound and no guarding.  Musculoskeletal: Normal range of motion.  Neurological: She is alert and oriented to person, place, and time. No sensory deficit.  Skin: Skin is warm. Capillary refill takes less than 2 seconds. No rash noted.  Psychiatric: She has a normal mood and affect.  Nursing note and vitals reviewed.    ED Treatments / Results  Labs (all labs ordered are listed, but only abnormal results are displayed) Labs Reviewed  URINALYSIS, ROUTINE W REFLEX MICROSCOPIC - Abnormal; Notable for the following:       Result Value   APPearance HAZY (*)    Ketones, ur 5 (*)    Nitrite POSITIVE (*)    Squamous Epithelial / LPF 0-5 (*)     All other components within normal limits  CBC WITH DIFFERENTIAL/PLATELET - Abnormal; Notable for the following:    WBC 19.0 (*)    Neutro Abs 16.2 (*)    Monocytes Absolute 1.2 (*)    All other components within normal limits  COMPREHENSIVE METABOLIC PANEL - Abnormal; Notable for the following:    Potassium 3.1 (*)    Glucose, Bld 124 (*)    All other components within normal limits  CBG MONITORING, ED - Abnormal; Notable for the following:    Glucose-Capillary 131 (*)    All other components within normal limits  URINE CULTURE  LIPASE, BLOOD    EKG  EKG Interpretation None       Radiology No results found.  Procedures Procedures (including critical care time)  Medications Ordered in ED Medications  sodium chloride 0.9 % bolus 1,000 mL (  0 mLs Intravenous Stopped 04/20/17 2154)  ondansetron (ZOFRAN) injection 4 mg (4 mg Intravenous Given 04/20/17 2110)     Initial Impression / Assessment and Plan / ED Course  I have reviewed the triage vital signs and the nursing notes.  Pertinent labs & imaging results that were available during my care of the patient were reviewed by me and considered in my medical decision making (see chart for details).     Pt non-toxic appearing.  Vitals reassuring  Urine culture pending.  Anion gap wnml  Pt has tolerated po fluids and reports that she is feeling better and ready for her discharge.  On further history taking, pt reports some back pain 1-2 weeks ago and saw her PCP for what she felt to be a UTI and was told her U/A was clean.  I will treat her for UTI given recent sx's, leukocytosis and nitrite pos urine.  I have discussed need for close f/u, ER return if sx's worsen.  Pt verbalized understanding and agrees to plan.   Final Clinical Impressions(s) / ED Diagnoses   Final diagnoses:  Nausea and vomiting in adult  Dysuria    New Prescriptions Discharge Medication List as of 04/20/2017 11:26 PM    START taking these  medications   Details  cephALEXin (KEFLEX) 500 MG capsule Take 1 capsule (500 mg total) by mouth 4 (four) times daily. For 7 days, Starting Fri 04/20/2017, Print    promethazine (PHENERGAN) 25 MG tablet Take 1 tablet (25 mg total) by mouth every 6 (six) hours as needed for nausea or vomiting., Starting Fri 04/20/2017, Print         Camran Keady, [place], PA-C 04/22/17 McIntosh, [place], PA-C 04/23/17 [place]ie, [place], DO 04/27/17 [phone]

## 2017-04-23 LAB — URINE CULTURE: Culture: <10000 — AB

## 2017-07-02 DIAGNOSIS — M25562 Pain in left knee: Secondary | ICD-10-CM | POA: Diagnosis not present

## 2017-07-02 DIAGNOSIS — Z0000 Encounter for general adult medical examination without abnormal findings: Secondary | ICD-10-CM | POA: Diagnosis not present

## 2017-07-02 DIAGNOSIS — Z1231 Encounter for screening mammogram for malignant neoplasm of breast: Secondary | ICD-10-CM | POA: Diagnosis not present

## 2017-07-02 DIAGNOSIS — E1165 Type 2 diabetes mellitus with hyperglycemia: Secondary | ICD-10-CM | POA: Diagnosis not present

## 2017-07-02 DIAGNOSIS — Z23 Encounter for immunization: Secondary | ICD-10-CM | POA: Diagnosis not present

## 2017-07-02 DIAGNOSIS — Z803 Family history of malignant neoplasm of breast: Secondary | ICD-10-CM | POA: Diagnosis not present

## 2017-07-03 ENCOUNTER — Other Ambulatory Visit: Payer: Self-pay | Admitting: Nurse Practitioner

## 2017-07-03 DIAGNOSIS — Z1231 Encounter for screening mammogram for malignant neoplasm of breast: Secondary | ICD-10-CM

## 2017-07-04 ENCOUNTER — Ambulatory Visit
Admission: RE | Admit: 2017-07-04 | Discharge: 2017-07-04 | Disposition: A | Discharge status: Home / Self Care | Payer: 59 | Source: Ambulatory Visit | Attending: Nurse Practitioner | Admitting: Nurse Practitioner

## 2017-07-04 DIAGNOSIS — Z1231 Encounter for screening mammogram for malignant neoplasm of breast: Secondary | ICD-10-CM | POA: Diagnosis not present

## 2017-07-04 IMAGING — MG 2D DIGITAL SCREENING BILATERAL MAMMOGRAM WITH CAD AND ADJUNCT TO
8 of 12 series · 8 of 28 positions shown · non-contrast
Comparison: Previous exam(s).

CLINICAL DATA: Screening.

EXAM:
2D DIGITAL SCREENING BILATERAL MAMMOGRAM WITH CAD AND ADJUNCT TOMO

[R CC synth-2D]
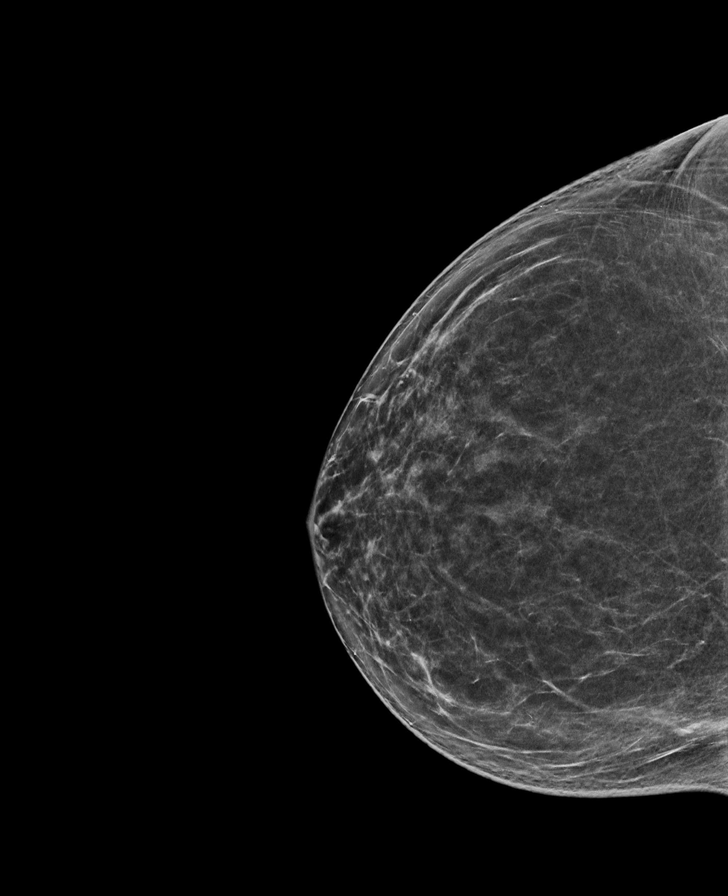

[R MLO synth-2D]
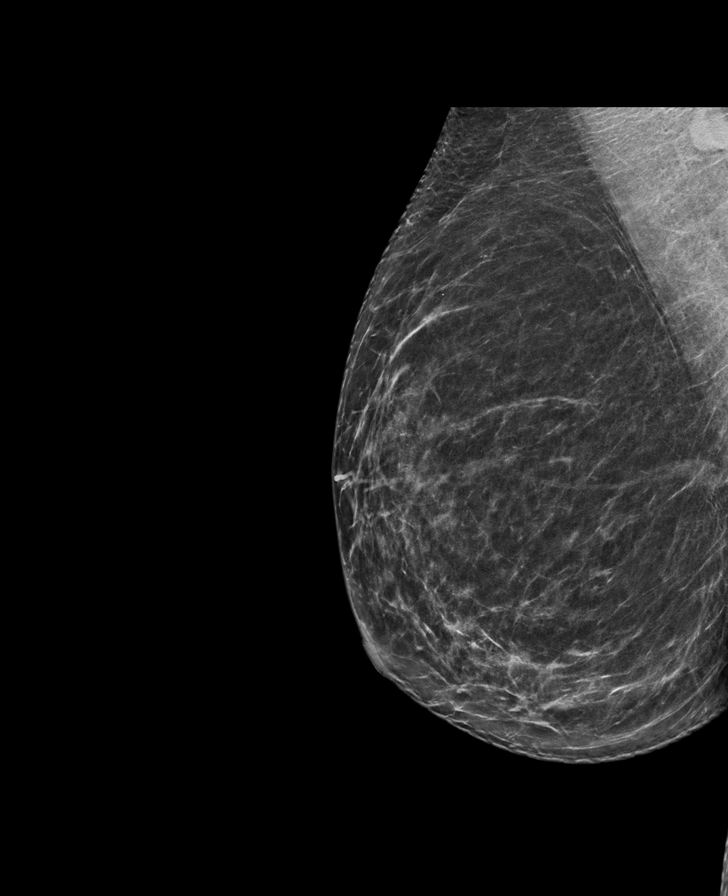

[L CC synth-2D]
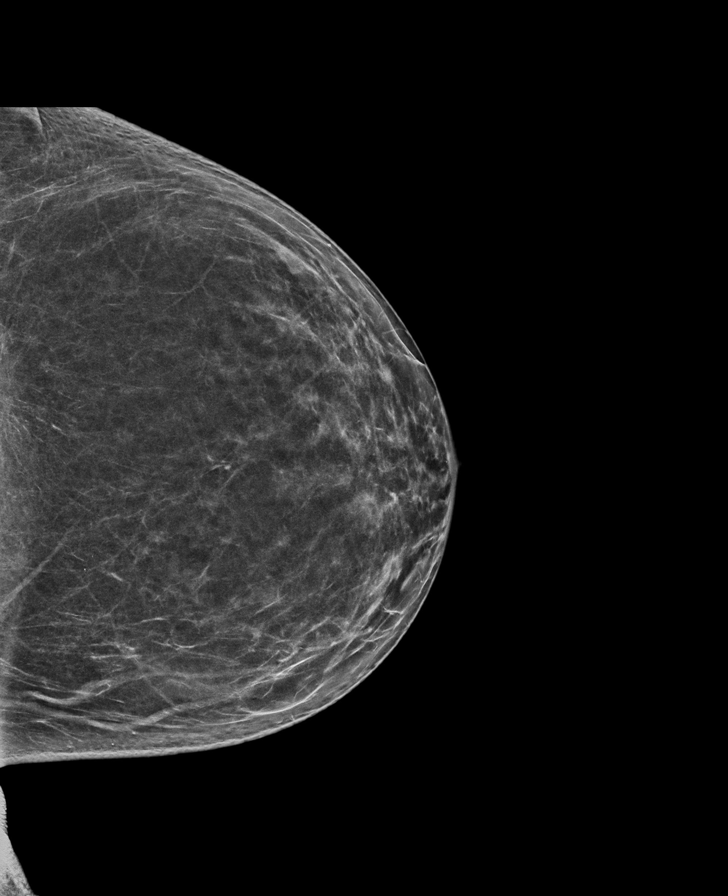

[R CC]
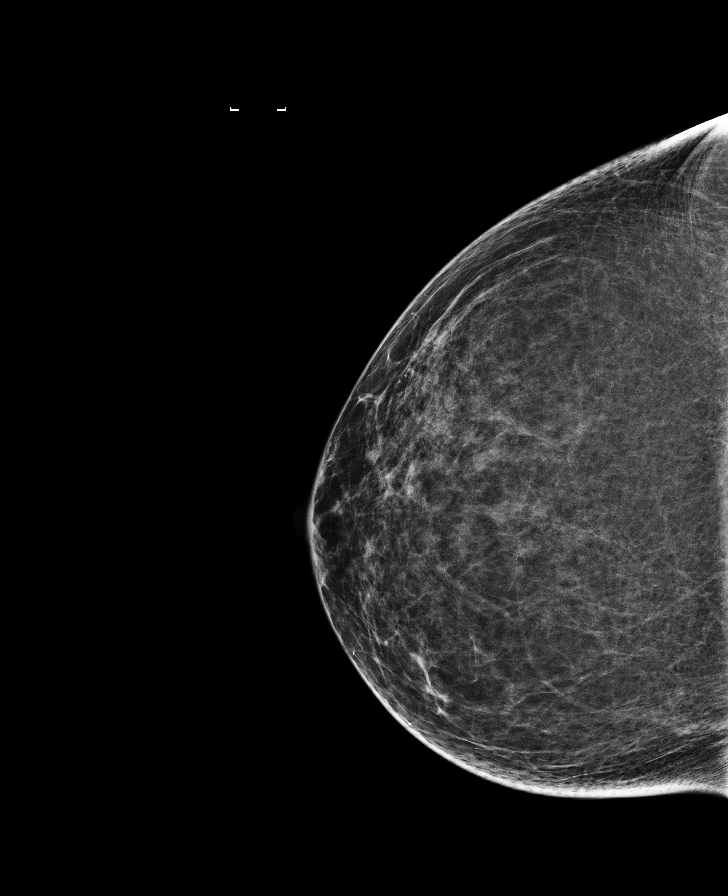

[L MLO]
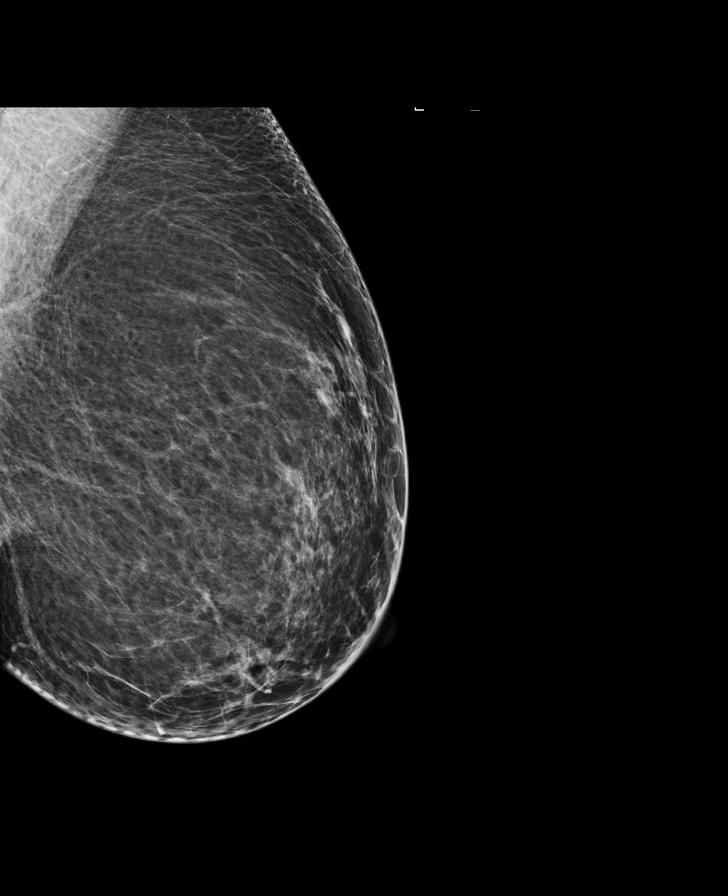

[L CC]
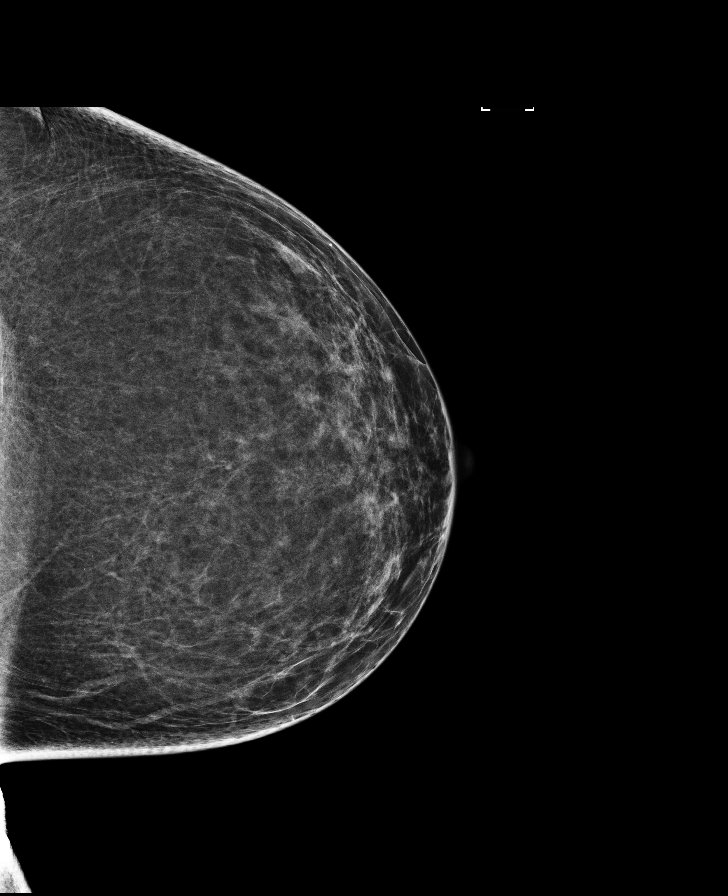

[L MLO synth-2D]
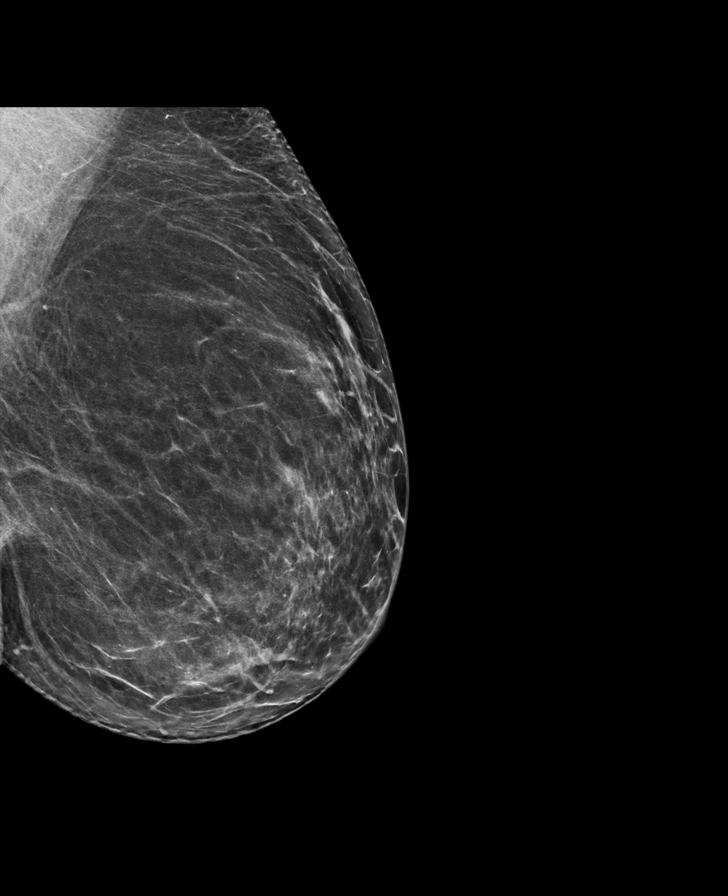

[R MLO]
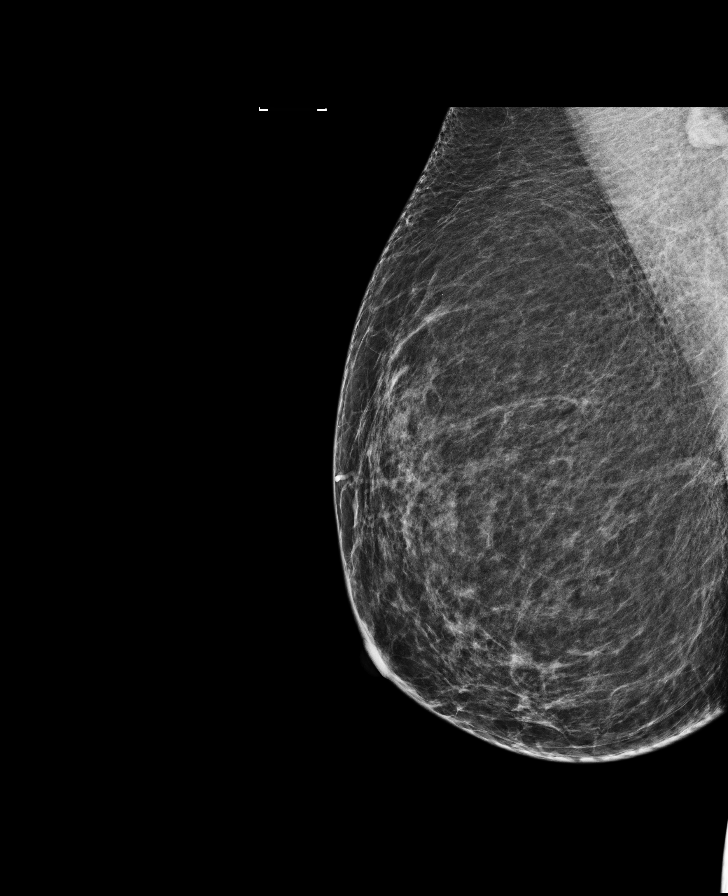

[8 of 28 positions shown; findings below may reference images not displayed]

ACR Breast Density Category b: There are scattered areas of
fibroglandular density.
FINDINGS: There are no findings suspicious for malignancy. Images were
processed with CAD.
IMPRESSION: No mammographic evidence of malignancy. A result letter of this
screening mammogram will be mailed directly to the patient.

RECOMMENDATION:
Screening mammogram in one year. (Code:[33])

BI-RADS CATEGORY  1: Negative.

## 2017-07-11 DIAGNOSIS — L0390 Cellulitis, unspecified: Secondary | ICD-10-CM | POA: Diagnosis not present

## 2017-09-03 DIAGNOSIS — E119 Type 2 diabetes mellitus without complications: Secondary | ICD-10-CM | POA: Diagnosis not present

## 2017-09-03 DIAGNOSIS — M545 Low back pain: Secondary | ICD-10-CM | POA: Diagnosis not present

## 2017-10-05 DIAGNOSIS — Z634 Disappearance and death of family member: Secondary | ICD-10-CM | POA: Diagnosis not present

## 2017-11-05 DIAGNOSIS — E119 Type 2 diabetes mellitus without complications: Secondary | ICD-10-CM | POA: Diagnosis not present

## 2017-11-21 ENCOUNTER — Other Ambulatory Visit: Payer: Self-pay | Admitting: Nurse Practitioner

## 2017-11-21 DIAGNOSIS — R799 Abnormal finding of blood chemistry, unspecified: Secondary | ICD-10-CM

## 2017-11-30 ENCOUNTER — Ambulatory Visit
Admission: RE | Admit: 2017-11-30 | Discharge: 2017-11-30 | Disposition: A | Discharge status: Home / Self Care | Payer: 59 | Source: Ambulatory Visit | Attending: Nurse Practitioner | Admitting: Nurse Practitioner

## 2017-11-30 DIAGNOSIS — R112 Nausea with vomiting, unspecified: Secondary | ICD-10-CM | POA: Diagnosis not present

## 2017-11-30 DIAGNOSIS — R799 Abnormal finding of blood chemistry, unspecified: Secondary | ICD-10-CM

## 2017-12-09 ENCOUNTER — Encounter (HOSPITAL_COMMUNITY): Payer: Self-pay | Admitting: Student

## 2017-12-09 ENCOUNTER — Emergency Department (HOSPITAL_COMMUNITY)
Admission: EM | Admit: 2017-12-09 | Discharge: 2017-12-10 | Disposition: A | Discharge status: Home / Self Care | Payer: 59 | Attending: Emergency Medicine | Admitting: Emergency Medicine

## 2017-12-09 ENCOUNTER — Other Ambulatory Visit: Payer: Self-pay

## 2017-12-09 ENCOUNTER — Emergency Department (HOSPITAL_COMMUNITY): Payer: 59

## 2017-12-09 DIAGNOSIS — H6691 Otitis media, unspecified, right ear: Secondary | ICD-10-CM | POA: Insufficient documentation

## 2017-12-09 DIAGNOSIS — I10 Essential (primary) hypertension: Secondary | ICD-10-CM | POA: Diagnosis not present

## 2017-12-09 DIAGNOSIS — Z79899 Other long term (current) drug therapy: Secondary | ICD-10-CM | POA: Diagnosis not present

## 2017-12-09 DIAGNOSIS — M545 Low back pain, unspecified: Secondary | ICD-10-CM

## 2017-12-09 DIAGNOSIS — Z7984 Long term (current) use of oral hypoglycemic drugs: Secondary | ICD-10-CM | POA: Insufficient documentation

## 2017-12-09 DIAGNOSIS — E119 Type 2 diabetes mellitus without complications: Secondary | ICD-10-CM | POA: Diagnosis not present

## 2017-12-09 DIAGNOSIS — R1030 Lower abdominal pain, unspecified: Secondary | ICD-10-CM | POA: Diagnosis present

## 2017-12-09 DIAGNOSIS — F17210 Nicotine dependence, cigarettes, uncomplicated: Secondary | ICD-10-CM | POA: Diagnosis not present

## 2017-12-09 DIAGNOSIS — R140 Abdominal distension (gaseous): Secondary | ICD-10-CM | POA: Diagnosis not present

## 2017-12-09 LAB — URINALYSIS, ROUTINE W REFLEX MICROSCOPIC
Bilirubin Urine: NEGATIVE
Glucose, UA: NEGATIVE mg/dL
Hgb urine dipstick: NEGATIVE
Ketones, ur: NEGATIVE mg/dL
Leukocytes, UA: NEGATIVE
Nitrite: NEGATIVE
Protein, ur: NEGATIVE mg/dL
Specific Gravity, Urine: 1.02 (ref 1.005–1.030)
pH: 7 (ref 5.0–8.0)

## 2017-12-09 LAB — CBC WITH DIFFERENTIAL/PLATELET
Basophils Absolute: 0 10*3/uL (ref 0.0–0.1)
Basophils Relative: 0 %
Eosinophils Absolute: 0.1 10*3/uL (ref 0.0–0.7)
Eosinophils Relative: 1 %
HCT: 37.1 % (ref 36.0–46.0)
Hemoglobin: 12 g/dL (ref 12.0–15.0)
Lymphocytes Relative: 36 %
Lymphs Abs: 4.1 10*3/uL — ABNORMAL HIGH (ref 0.7–4.0)
MCH: 28.5 pg (ref 26.0–34.0)
MCHC: 32.3 g/dL (ref 30.0–36.0)
MCV: 88.1 fL (ref 78.0–100.0)
Monocytes Absolute: 0.6 10*3/uL (ref 0.1–1.0)
Monocytes Relative: 6 %
Neutro Abs: 6.6 10*3/uL (ref 1.7–7.7)
Neutrophils Relative %: 57 %
Platelets: 316 10*3/uL (ref 150–400)
RBC: 4.21 MIL/uL (ref 3.87–5.11)
RDW: 15.1 % (ref 11.5–15.5)
WBC: 11.5 10*3/uL — ABNORMAL HIGH (ref 4.0–10.5)

## 2017-12-09 LAB — COMPREHENSIVE METABOLIC PANEL
ALT: 27 U/L (ref 14–54)
AST: 36 U/L (ref 15–41)
Albumin: 4.1 g/dL (ref 3.5–5.0)
Alkaline Phosphatase: 109 U/L (ref 38–126)
Anion gap: 11 (ref 5–15)
BUN: 10 mg/dL (ref 6–20)
CO2: 27 mmol/L (ref 22–32)
Calcium: 9.7 mg/dL (ref 8.9–10.3)
Chloride: 103 mmol/L (ref 101–111)
Creatinine, Ser: 0.65 mg/dL (ref 0.44–1.00)
GFR calc Af Amer: >60 mL/min (ref >60)
GFR calc non Af Amer: >60 mL/min (ref >60)
Glucose, Bld: 79 mg/dL (ref 65–99)
Potassium: 3.8 mmol/L (ref 3.5–5.1)
Sodium: 141 mmol/L (ref 135–145)
Total Bilirubin: 0.3 mg/dL (ref 0.3–1.2)
Total Protein: 7.3 g/dL (ref 6.5–8.1)

## 2017-12-09 LAB — LIPASE, BLOOD: Lipase: 32 U/L (ref 11–51)

## 2017-12-09 LAB — PREGNANCY, URINE: Preg Test, Ur: NEGATIVE

## 2017-12-09 IMAGING — CT CT ABD-PELV W/ CM
2 of 5 series · 16 of 46 positions shown, 18 images · IV contrast (Isovue)
Comparison: [DATE]

CLINICAL DATA: Sharp intermittent back pain since this morning.
Pain radiates to the abdomen bilaterally. Nausea.

EXAM:
CT ABDOMEN AND PELVIS WITH CONTRAST
TECHNIQUE: Multidetector CT imaging of the abdomen and pelvis was performed
using the standard protocol following bolus administration of
intravenous contrast.
CONTRAST:  100mL [WE] IOPAMIDOL ([WE]) INJECTION 61%

[Series 2: axial st · axial · 0.73mm/px · z∈[+1031,+1476]mm · 13 of 101 slices shown, 15 images]
[im 6/101  soft-tissue]
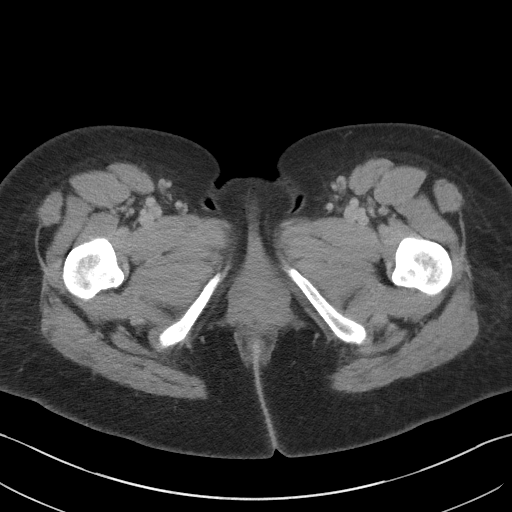
[im 6/101  bone]
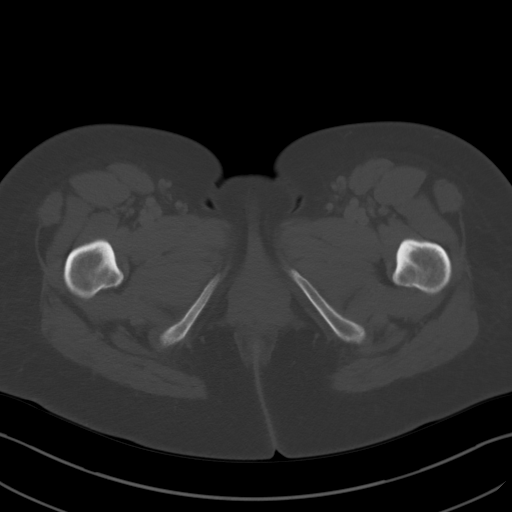
[im 12/101  soft-tissue]
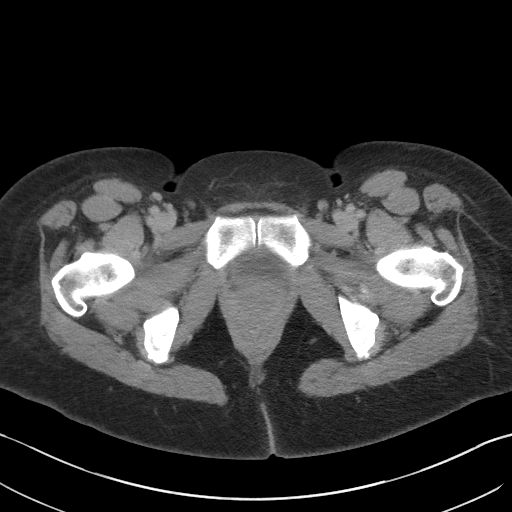
[im 23/101  soft-tissue]
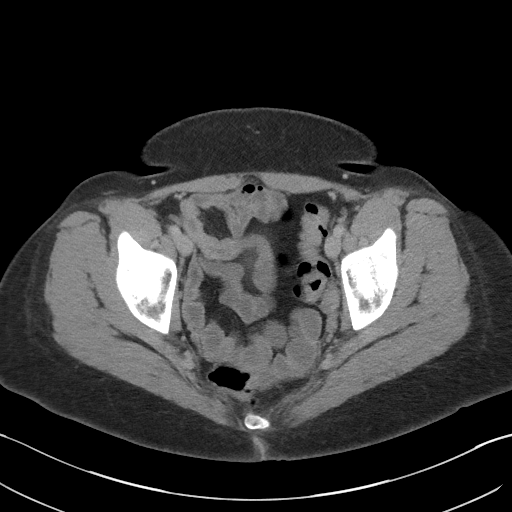
[im 28/101  soft-tissue]
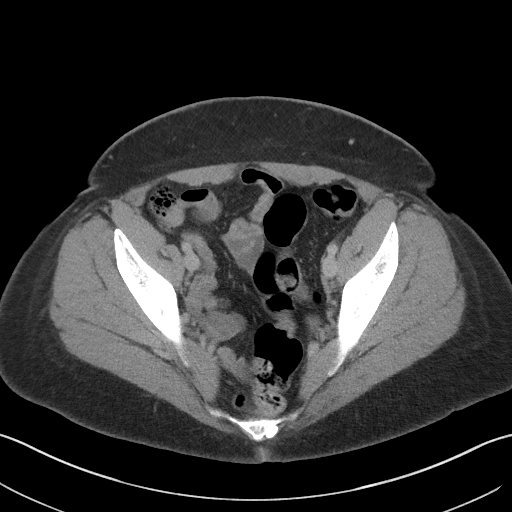
[im 34/101  soft-tissue]
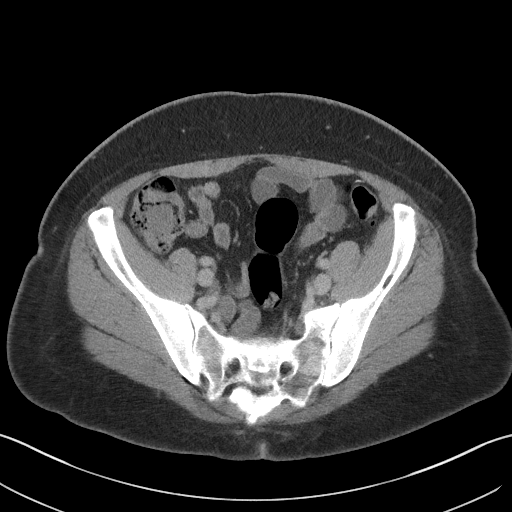
[im 45/101  soft-tissue]
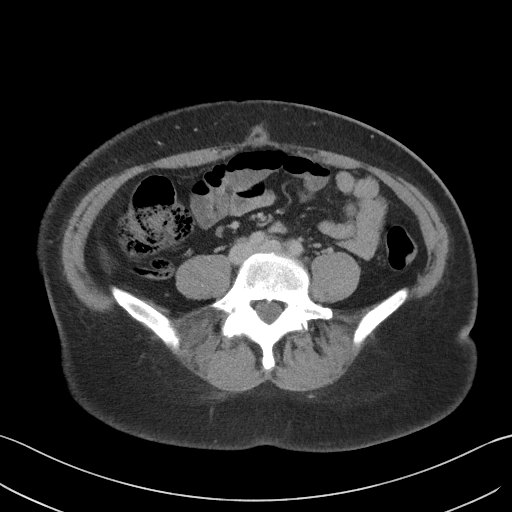
[im 51/101  soft-tissue]
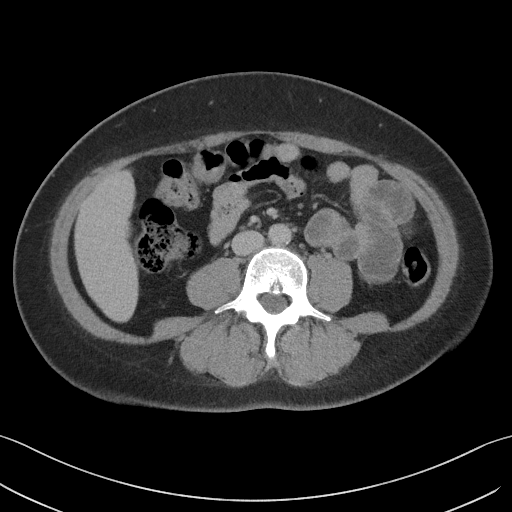
[im 56/101  soft-tissue]
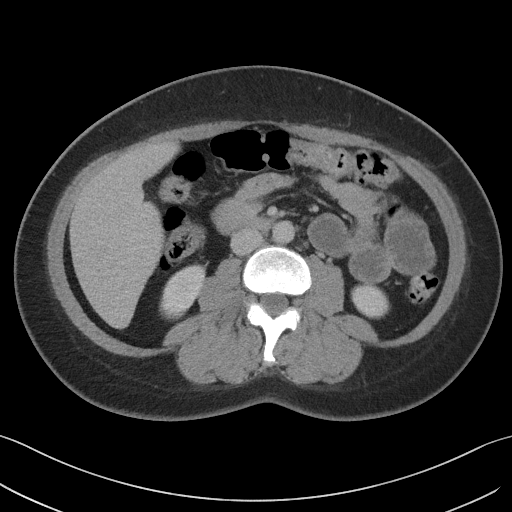
[im 67/101  soft-tissue]
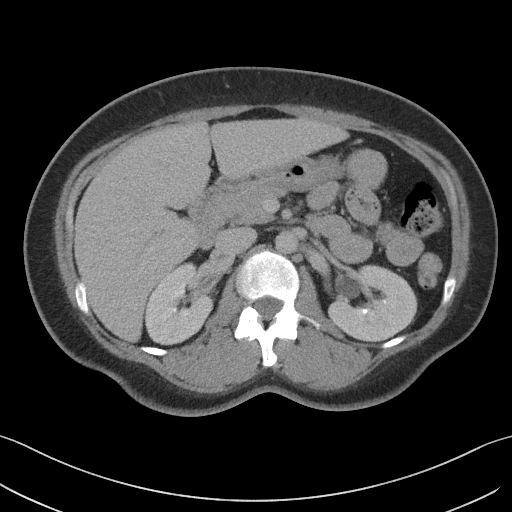
[im 67/101  bone]
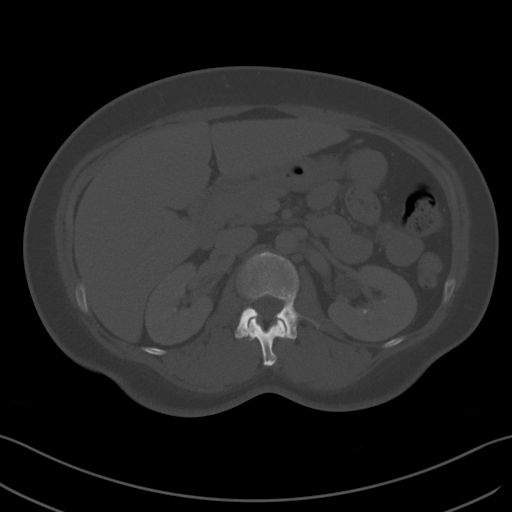
[im 73/101  soft-tissue]
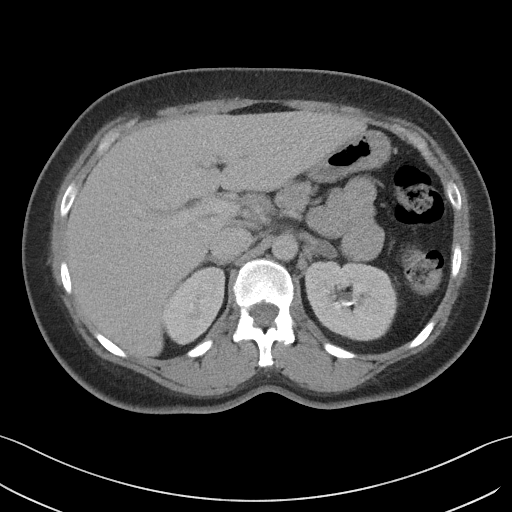
[im 78/101  soft-tissue]
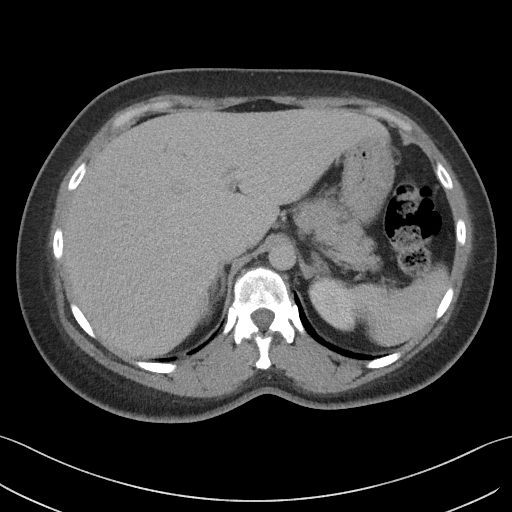
[im 89/101  soft-tissue]
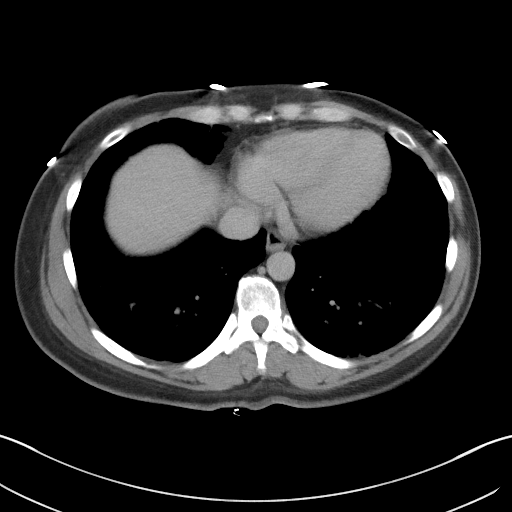
[im 95/101  soft-tissue]
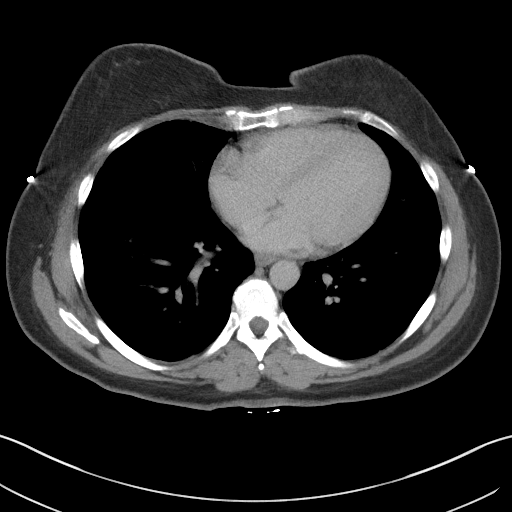

[Series 5: coronal st · coronal · 0.80mm/px · 3 of 102 slices shown]
[im 34/102  soft-tissue]
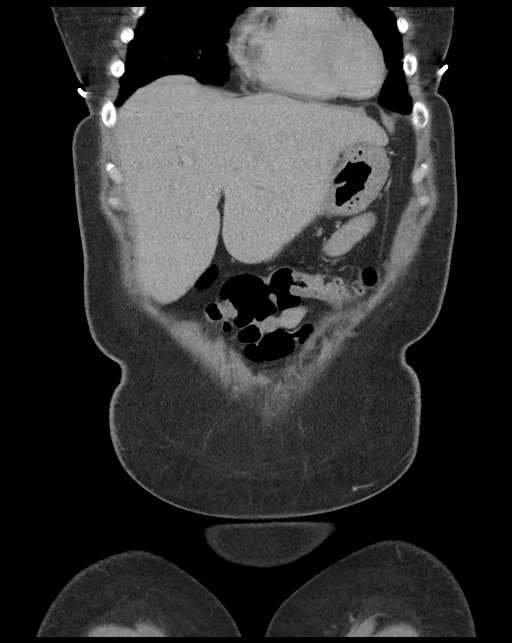
[im 45/102  soft-tissue]
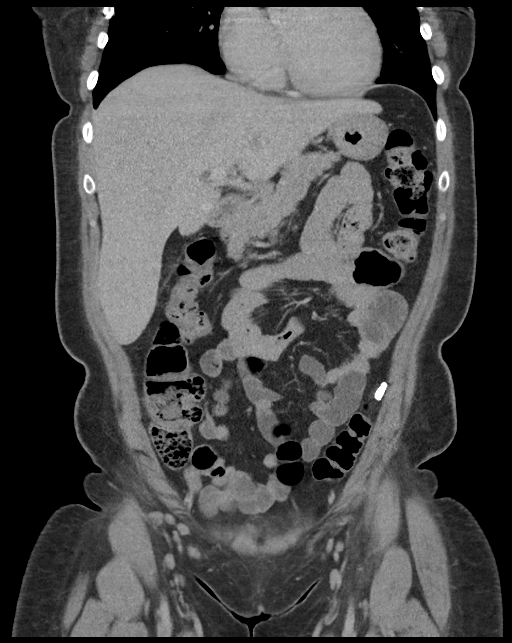
[im 57/102  soft-tissue]
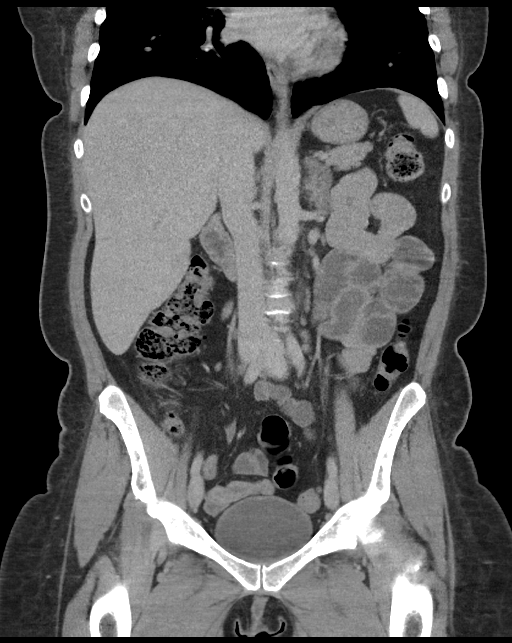

[16 of 46 positions shown; findings below may reference images not displayed]

FINDINGS: Lower chest: Visualization of lung bases is limited due to motion
artifact. Probable fibrosis in the lung bases similar to prior
study.

Hepatobiliary: No focal liver abnormality is seen. Status post
cholecystectomy. No biliary dilatation.

Pancreas: Unremarkable. No pancreatic ductal dilatation or
surrounding inflammatory changes.

Spleen: Normal in size without focal abnormality.

Adrenals/Urinary Tract: Subcentimeter left adrenal gland nodule,
nonspecific but likely benign. Nephrograms are symmetrical. No solid
renal masses. No hydronephrosis or hydroureter. Bladder wall is not
thickened and no filling defects are identified.

Stomach/Bowel: Stomach, small bowel, and colon are not abnormally
distended. Under distention limits evaluation of the wall but there
is suggestion of mild wall thickening in some of the left upper
quadrant small bowel. This could indicate enteritis. Scattered stool
throughout the colon without colonic distention. Appendix is normal.

Vascular/Lymphatic: Aortic atherosclerosis. No enlarged abdominal or
pelvic lymph nodes.

Reproductive: Status post hysterectomy. No adnexal masses.

Other: No abdominal wall hernia or abnormality. No abdominopelvic
ascites.

Musculoskeletal: No acute or significant osseous findings.
IMPRESSION: 1. Possible small bowel wall thickening in the left upper quadrant.
This may indicate enteritis or could be due to under distention.
2. Scattered fibrosis in the lung bases.
3. Aortic atherosclerosis.

## 2017-12-09 MED ORDER — MORPHINE SULFATE (PF) 4 MG/ML IV SOLN
4.0000 mg | Freq: Once | INTRAVENOUS | Status: AC
  Administered 2017-12-09: 4 mg via INTRAVENOUS
  Filled 2017-12-09: qty 1

## 2017-12-09 MED ORDER — SODIUM CHLORIDE 0.9 % IV BOLUS
500.0000 mL | Freq: Once | INTRAVENOUS | Status: AC
  Administered 2017-12-09: 500 mL via INTRAVENOUS

## 2017-12-09 MED ORDER — AMOXICILLIN 500 MG PO CAPS: 500.0000 mg | ORAL_CAPSULE | Freq: Three times a day (TID) | ORAL | 0 refills | Status: DC

## 2017-12-09 MED ORDER — MORPHINE SULFATE (PF) 2 MG/ML IV SOLN
2.0000 mg | Freq: Once | INTRAVENOUS | Status: AC
  Administered 2017-12-09: 2 mg via INTRAVENOUS
  Filled 2017-12-09: qty 1

## 2017-12-09 MED ORDER — HYDROCODONE-ACETAMINOPHEN 5-325 MG PO TABS: ORAL_TABLET | ORAL | 0 refills | Status: DC

## 2017-12-09 MED ORDER — IOPAMIDOL (ISOVUE-300) INJECTION 61%
100.0000 mL | Freq: Once | INTRAVENOUS | Status: AC | PRN
  Administered 2017-12-09: 100 mL via INTRAVENOUS

## 2017-12-09 MED ORDER — ONDANSETRON HCL 4 MG/2ML IJ SOLN
4.0000 mg | Freq: Once | INTRAMUSCULAR | Status: AC
  Administered 2017-12-09: 4 mg via INTRAVENOUS
  Filled 2017-12-09: qty 2

## 2017-12-09 MED ORDER — NAPROXEN 500 MG PO TABS: 500.0000 mg | ORAL_TABLET | Freq: Two times a day (BID) | ORAL | 0 refills | Status: DC

## 2017-12-09 MED ORDER — METHOCARBAMOL 500 MG PO TABS
500.0000 mg | ORAL_TABLET | Freq: Three times a day (TID) | ORAL | 0 refills | Status: DC
Start: 2017-12-09 — End: 2018-02-10

## 2017-12-09 NOTE — ED Notes (Signed)
Pt ambulatory to bathroom and back to room with standby assist 

## 2017-12-09 NOTE — ED Triage Notes (Signed)
Pt reports back since this AM when she woke up. Pain lower back that radiates to abdomen.

## 2017-12-09 NOTE — ED Notes (Signed)
Pt in CT at this time.

## 2017-12-09 NOTE — ED Provider Notes (Signed)
[facility] EMERGENCY DEPARTMENT Provider Note   CSN: [id number] Arrival date & time: 12/09/17  1935     History   Chief Complaint Chief Complaint  Patient presents with  . Flank Pain    HPI Virginia Douglas is a 42 y.o. female.  HPI   Virginia Douglas is a 42 y.o. female who presents to the Emergency Department complaining of mid to lower back pain since waking this morning.  She describes the pain as sharp and radiating down her mid to lower spine and around into her lower abdomen.  Pain is associated with standing, walking and sitting.  Pain improves when lying flat and on her stomach.  She denies known injury.  Pain is not associated with nausea, food intake, vomiting, urine or bowel changes, pain, numbness or weakness of the lower extremities, chest pain or shortness of breath.  She has taken tylenol earlier today without relief. She also complains of right ear pain for several days.  No cough, sore throat, dizziness or decreased hearing.    Past Medical History:  Diagnosis Date  . Anemia    iron deficiency  . Diabetes ([place])   . Edema   . Migraine headache     Patient Active Problem List   Diagnosis Date Noted  . Diabetes ([place]) 12/08/2015  . Hypertension 12/08/2015  . Smoker 12/08/2015  . LLQ pain 12/08/2015  . Paresthesia 09/16/2015  . Urinary urgency 09/16/2015    Past Surgical History:  Procedure Laterality Date  . ABDOMINAL HYSTERECTOMY    . CHOLECYSTECTOMY       OB History   None      Home Medications    Prior to Admission medications   Medication Sig Start Date End Date Taking? Authorizing Provider  atorvastatin (LIPITOR) 10 MG tablet Take 10 mg by mouth daily. 03/10/17  Yes [provider]  lisinopril-hydrochlorothiazide (PRINZIDE,ZESTORETIC) 10-12.5 MG tablet Take 1 tablet by mouth daily. 03/10/17  Yes [provider]  metFORMIN (GLUCOPHAGE-XR) 500 MG 24 hr tablet Take 2 tablets (1,000 mg total) by mouth 2 (two) times daily.  03/10/16  Yes Long, Wonda Olds, MD  omeprazole (PRILOSEC) 40 MG capsule Take 1 capsule by mouth daily. 03/08/17  Yes [provider]  OZEMPIC 1 MG/DOSE SOPN Inject 1 mg into the skin once a week. 03/30/17  Yes [provider]  phentermine 15 MG capsule Take 1 capsule by mouth daily. 03/30/17  Yes [provider]  cephALEXin (KEFLEX) 500 MG capsule Take 1 capsule (500 mg total) by mouth 4 (four) times daily. For 7 days Patient not taking: Reported on 12/09/2017 04/20/17   Kem Parkinson, PA-C    Family History Family History  Problem Relation Age of Onset  . Diabetes Mother   . Hypertension Mother   . Diabetes Father   . Hypertension Father   . Hypertension Brother   . Diabetes Maternal Grandmother   . Breast cancer Maternal Grandmother   . Diabetes Paternal Grandmother   . Breast cancer Paternal Grandmother   . Cancer Maternal Aunt        breast  . Breast cancer Maternal Aunt     Social History Social History   Tobacco Use  . Smoking status: Current Some Day Smoker    Packs/day: 0.50    Types: Cigarettes  . Smokeless tobacco: Never Used  Substance Use Topics  . Alcohol use: No    Alcohol/week: 0.0 oz  . Drug use: No     Allergies  Patient has no known allergies.   Review of Systems Review of Systems  Constitutional: Negative for fever.  Respiratory: Negative for shortness of breath.   Cardiovascular: Negative for chest pain.  Gastrointestinal: Positive for abdominal pain. Negative for constipation and vomiting.  Genitourinary: Negative for decreased urine volume, difficulty urinating, dysuria, flank pain, hematuria, vaginal bleeding and vaginal discharge.  Musculoskeletal: Positive for back pain. Negative for joint swelling.  Skin: Negative for rash.  Neurological: Negative for weakness and numbness.  All other systems reviewed and are negative.    Physical Exam Updated Vital Signs BP 125/81 (BP Location: Right Arm)   Pulse (!) 130    Temp 98.1 F (36.7 C) (Oral)   Resp 20   Ht 5\' 7"  (1.702 m)   Wt 83 kg (183 lb)   SpO2 97%   BMI 28.66 kg/m   Physical Exam  Constitutional: She is oriented to person, place, and time. She appears well-developed and well-nourished. No distress.  HENT:  Head: Normocephalic and atraumatic.  Right Ear: Ear canal normal. No mastoid tenderness. Tympanic membrane is not bulging. No decreased hearing is noted.  Left Ear: Ear canal normal. No mastoid tenderness. Tympanic membrane is not bulging. No decreased hearing is noted.  Mouth/Throat: Uvula is midline, oropharynx is clear and moist and mucous membranes are normal. No uvula swelling.  Erythema of the right TM with loss of visual landmarks.  No bulging.    Neck: Normal range of motion. Neck supple.  Cardiovascular: Normal rate, regular rhythm and intact distal pulses.  DP pulses are strong and palpable bilaterally  Pulmonary/Chest: Effort normal and breath sounds normal. No respiratory distress. She exhibits no tenderness.  Abdominal: Soft. She exhibits no distension. There is no tenderness.  Musculoskeletal: She exhibits tenderness. She exhibits no edema.       Lumbar back: She exhibits tenderness and pain. She exhibits normal range of motion, no swelling, no deformity, no laceration and normal pulse.  ttp of the mid to lower lumbar spine and bilateral paraspinal muscles.  Pt has 5/5 strength against resistance of bilateral lower extremities.  Neg SLR bilaterally   Lymphadenopathy:    She has no cervical adenopathy.  Neurological: She is alert and oriented to person, place, and time. She has normal strength. No sensory deficit. She exhibits normal muscle tone. Coordination and gait normal.  Reflex Scores:      Patellar reflexes are 2+ on the right side and 2+ on the left side.      Achilles reflexes are 2+ on the right side and 2+ on the left side. Skin: Skin is warm and dry. Capillary refill takes less than 2 seconds. No rash noted.    Psychiatric: She has a normal mood and affect.  Nursing note and vitals reviewed.    ED Treatments / Results  Labs (all labs ordered are listed, but only abnormal results are displayed) Labs Reviewed  CBC WITH DIFFERENTIAL/PLATELET - Abnormal; Notable for the following components:      Result Value   WBC 11.5 (*)    Lymphs Abs 4.1 (*)    All other components within normal limits  URINALYSIS, ROUTINE W REFLEX MICROSCOPIC  PREGNANCY, URINE  COMPREHENSIVE METABOLIC PANEL  LIPASE, BLOOD    EKG None  Radiology Ct Abdomen Pelvis W Contrast  Result Date: 12/09/2017 CLINICAL DATA:  Sharp intermittent back pain since this morning. Pain radiates to the abdomen bilaterally. Nausea. EXAM: CT ABDOMEN AND PELVIS WITH CONTRAST TECHNIQUE: Multidetector CT imaging of the abdomen  and pelvis was performed using the standard protocol following bolus administration of intravenous contrast. CONTRAST:  174mL ISOVUE-300 IOPAMIDOL (ISOVUE-300) INJECTION 61% COMPARISON:  05/04/2011 FINDINGS: Lower chest: Visualization of lung bases is limited due to motion artifact. Probable fibrosis in the lung bases similar to prior study. Hepatobiliary: No focal liver abnormality is seen. Status post cholecystectomy. No biliary dilatation. Pancreas: Unremarkable. No pancreatic ductal dilatation or surrounding inflammatory changes. Spleen: Normal in size without focal abnormality. Adrenals/Urinary Tract: Subcentimeter left adrenal gland nodule, nonspecific but likely benign. Nephrograms are symmetrical. No solid renal masses. No hydronephrosis or hydroureter. Bladder wall is not thickened and no filling defects are identified. Stomach/Bowel: Stomach, small bowel, and colon are not abnormally distended. Under distention limits evaluation of the wall but there is suggestion of mild wall thickening in some of the left upper quadrant small bowel. This could indicate enteritis. Scattered stool throughout the colon without colonic  distention. Appendix is normal. Vascular/Lymphatic: Aortic atherosclerosis. No enlarged abdominal or pelvic lymph nodes. Reproductive: Status post hysterectomy. No adnexal masses. Other: No abdominal wall hernia or abnormality. No abdominopelvic ascites. Musculoskeletal: No acute or significant osseous findings. IMPRESSION: 1. Possible small bowel wall thickening in the left upper quadrant. This may indicate enteritis or could be due to under distention. 2. Scattered fibrosis in the lung bases. 3. Aortic atherosclerosis. Electronically Signed   By: Lucienne Capers M.D.   On: 12/09/2017 22:11    Procedures Procedures (including critical care time)  Medications Ordered in ED Medications - No data to display   Initial Impression / Assessment and Plan / ED Course  I have reviewed the triage vital signs and the nursing notes.  Pertinent labs & imaging results that were available during my care of the patient were reviewed by me and considered in my medical decision making (see chart for details).     Controlled Substance Prescriptions [place] Controlled Substance Registry consulted? Yes.  Pt receives monthly rx's for benzos and phentermine, no recent opioids    On recheck, pt is feeling better.  CT scan of A/P shows enteritis, but pt's symptoms are most c/w musculoskeletal, felt to be less likely enteritis, she does not have any associated sx's   She is ambulatory in the dept, gait steady.  No concerning sx's for spinal abscess or cauda equina.  States she is ready for d/c home, agrees to out pt f/u and return precautions discussed.   Final Clinical Impressions(s) / ED Diagnoses   Final diagnoses:  Acute midline low back pain without sciatica  Right otitis media, unspecified otitis media type    ED Discharge Orders    None       Kem Parkinson, PA-C 12/09/17 2352    Hayden Rasmussen, MD 12/10/17 1231

## 2017-12-09 NOTE — Discharge Instructions (Addendum)
Alternate ice and heat to your lower back.  Avoid twisting or bending movements for 1 week.  Follow-up with your primary doctor for recheck.  Return to the ER for any worsening symptoms such as fever, vomiting, numbness or weakness of your lower extremities or increasing abdominal pain.

## 2017-12-10 ENCOUNTER — Ambulatory Visit: Payer: PRIVATE HEALTH INSURANCE | Admitting: Licensed Clinical Social Worker

## 2018-01-07 DIAGNOSIS — J019 Acute sinusitis, unspecified: Secondary | ICD-10-CM | POA: Diagnosis not present

## 2018-02-09 ENCOUNTER — Encounter (HOSPITAL_COMMUNITY): Payer: Self-pay | Admitting: Emergency Medicine

## 2018-02-09 ENCOUNTER — Emergency Department (HOSPITAL_COMMUNITY)
Admission: EM | Admit: 2018-02-09 | Discharge: 2018-02-10 | Disposition: A | Discharge status: Home / Self Care | Payer: 59 | Attending: Emergency Medicine | Admitting: Emergency Medicine

## 2018-02-09 ENCOUNTER — Other Ambulatory Visit: Payer: Self-pay

## 2018-02-09 DIAGNOSIS — I10 Essential (primary) hypertension: Secondary | ICD-10-CM | POA: Diagnosis not present

## 2018-02-09 DIAGNOSIS — Z79899 Other long term (current) drug therapy: Secondary | ICD-10-CM | POA: Diagnosis not present

## 2018-02-09 DIAGNOSIS — R51 Headache: Secondary | ICD-10-CM | POA: Insufficient documentation

## 2018-02-09 DIAGNOSIS — Z7984 Long term (current) use of oral hypoglycemic drugs: Secondary | ICD-10-CM | POA: Insufficient documentation

## 2018-02-09 DIAGNOSIS — F17210 Nicotine dependence, cigarettes, uncomplicated: Secondary | ICD-10-CM | POA: Diagnosis not present

## 2018-02-09 DIAGNOSIS — E119 Type 2 diabetes mellitus without complications: Secondary | ICD-10-CM | POA: Insufficient documentation

## 2018-02-09 DIAGNOSIS — I6523 Occlusion and stenosis of bilateral carotid arteries: Secondary | ICD-10-CM | POA: Diagnosis not present

## 2018-02-09 DIAGNOSIS — F419 Anxiety disorder, unspecified: Secondary | ICD-10-CM | POA: Insufficient documentation

## 2018-02-09 DIAGNOSIS — M436 Torticollis: Secondary | ICD-10-CM | POA: Diagnosis not present

## 2018-02-09 DIAGNOSIS — M542 Cervicalgia: Secondary | ICD-10-CM | POA: Diagnosis present

## 2018-02-09 LAB — I-STAT CHEM 8, ED
BUN: 5 mg/dL — ABNORMAL LOW (ref 6–20)
Calcium, Ion: 1.09 mmol/L — ABNORMAL LOW (ref 1.15–1.40)
Chloride: 106 mmol/L (ref 101–111)
Creatinine, Ser: 0.5 mg/dL (ref 0.44–1.00)
Glucose, Bld: 133 mg/dL — ABNORMAL HIGH (ref 65–99)
HCT: 41 % (ref 36.0–46.0)
Hemoglobin: 13.9 g/dL (ref 12.0–15.0)
Potassium: 3.4 mmol/L — ABNORMAL LOW (ref 3.5–5.1)
Sodium: 139 mmol/L (ref 135–145)
TCO2: 23 mmol/L (ref 22–32)

## 2018-02-09 MED ORDER — KETOROLAC TROMETHAMINE 30 MG/ML IJ SOLN
30.0000 mg | Freq: Once | INTRAMUSCULAR | Status: AC
  Administered 2018-02-09: 30 mg via INTRAVENOUS
  Filled 2018-02-09: qty 1

## 2018-02-09 MED ORDER — DIPHENHYDRAMINE HCL 50 MG/ML IJ SOLN
25.0000 mg | Freq: Once | INTRAMUSCULAR | Status: AC
  Administered 2018-02-09: 25 mg via INTRAVENOUS
  Filled 2018-02-09: qty 1

## 2018-02-09 MED ORDER — DIAZEPAM 5 MG PO TABS
5.0000 mg | ORAL_TABLET | Freq: Once | ORAL | Status: AC
  Administered 2018-02-09: 5 mg via ORAL
  Filled 2018-02-09: qty 1

## 2018-02-09 MED ORDER — METOCLOPRAMIDE HCL 5 MG/ML IJ SOLN
10.0000 mg | Freq: Once | INTRAMUSCULAR | Status: AC
  Administered 2018-02-09: 10 mg via INTRAVENOUS
  Filled 2018-02-09: qty 2

## 2018-02-09 NOTE — ED Provider Notes (Signed)
[facility] EMERGENCY DEPARTMENT Provider Note   CSN: [id number] Arrival date & time: 02/09/18  2227     History   Chief Complaint Chief Complaint  Patient presents with  . Neck Pain    HPI Virginia Douglas is a 42 y.o. female.  Patient presents with a 2-day history of diffuse neck pain that radiates up into her head.  She denies any fall or trauma.  She has been taking ibuprofen and heating pads at home with minimal relief.  She states she has first noticed this when she woke up from sleep in the morning of June 7 and it has progressively worsened.  She is never had this kind of pain before.  She denies any weakness in her arms or legs.  No numbness or tingling.  No vision changes.  No bowel or bladder incontinence.  No fever.  She does report a migraine headache and believes the pain is rating up her neck causing a typical migraine headache that is gradual in onset.  Denies thunderclap onset.  She denies any chest pain or shortness of breath.  The history is provided by the patient.  Neck Pain   Associated symptoms include headaches. Pertinent negatives include no photophobia, no numbness and no weakness.    Past Medical History:  Diagnosis Date  . Anemia    iron deficiency  . Diabetes ([place])   . Edema   . Migraine headache     Patient Active Problem List   Diagnosis Date Noted  . Diabetes ([place]) 12/08/2015  . Hypertension 12/08/2015  . Smoker 12/08/2015  . LLQ pain 12/08/2015  . Paresthesia 09/16/2015  . Urinary urgency 09/16/2015    Past Surgical History:  Procedure Laterality Date  . ABDOMINAL HYSTERECTOMY    . CHOLECYSTECTOMY       OB History   None      Home Medications    Prior to Admission medications   Medication Sig Start Date End Date Taking? Authorizing Provider  ALPRAZolam Duanne Moron) 0.5 MG tablet Take 0.5 mg by mouth 2 (two) times daily as needed. 02/04/18  Yes [provider]  atorvastatin (LIPITOR) 10 MG tablet Take 10 mg by mouth  daily. 03/10/17  Yes [provider]  citalopram (CELEXA) 20 MG tablet Take 20 mg by mouth at bedtime. 01/16/18  Yes [provider]  lisinopril-hydrochlorothiazide (PRINZIDE,ZESTORETIC) 10-12.5 MG tablet Take 1 tablet by mouth daily. 03/10/17  Yes [provider]  metFORMIN (GLUCOPHAGE-XR) 500 MG 24 hr tablet Take 2 tablets (1,000 mg total) by mouth 2 (two) times daily. 03/10/16  Yes Long, Wonda Olds, MD  omeprazole (PRILOSEC) 40 MG capsule Take 1 capsule by mouth daily. 03/08/17  Yes [provider]  OZEMPIC 1 MG/DOSE SOPN Inject 1 mg into the skin every Sunday.  03/30/17  Yes [provider]  phentermine (ADIPEX-P) 37.5 MG tablet Take 37.5 mg by mouth every morning. 02/04/18  Yes [provider]  amoxicillin (AMOXIL) 500 MG capsule Take 1 capsule (500 mg total) by mouth 3 (three) times daily. Patient not taking: Reported on 02/09/2018 12/09/17   Kem Parkinson, PA-C  HYDROcodone-acetaminophen (NORCO/VICODIN) 5-325 MG tablet Take one tab po q 4 hrs prn pain Patient not taking: Reported on 02/09/2018 12/09/17   Triplett, Tammy, PA-C  methocarbamol (ROBAXIN) 500 MG tablet Take 1 tablet (500 mg total) by mouth 3 (three) times daily. Patient not taking: Reported on 02/09/2018 12/09/17   Triplett, Tammy, PA-C  naproxen (NAPROSYN) 500 MG tablet Take 1 tablet (  500 mg total) by mouth 2 (two) times daily with a meal. Patient not taking: Reported on 02/09/2018 12/09/17   Kem Parkinson, PA-C    Family History Family History  Problem Relation Age of Onset  . Diabetes Mother   . Hypertension Mother   . Diabetes Father   . Hypertension Father   . Hypertension Brother   . Diabetes Maternal Grandmother   . Breast cancer Maternal Grandmother   . Diabetes Paternal Grandmother   . Breast cancer Paternal Grandmother   . Cancer Maternal Aunt        breast  . Breast cancer Maternal Aunt     Social History Social History   Tobacco Use  . Smoking status: Current Some Day  Smoker    Packs/day: 0.50    Types: Cigarettes  . Smokeless tobacco: Never Used  Substance Use Topics  . Alcohol use: No    Alcohol/week: 0.0 oz  . Drug use: No     Allergies   Patient has no known allergies.   Review of Systems Review of Systems  Constitutional: Negative for activity change, appetite change and fever.  HENT: Positive for congestion. Negative for rhinorrhea.   Eyes: Negative for photophobia and visual disturbance.  Respiratory: Negative for cough and shortness of breath.   Gastrointestinal: Negative for abdominal pain, nausea and vomiting.  Genitourinary: Negative for dysuria, flank pain, hematuria, urgency, vaginal bleeding and vaginal discharge.  Musculoskeletal: Positive for neck pain. Negative for arthralgias and gait problem.  Skin: Negative for rash.  Neurological: Positive for headaches. Negative for dizziness, weakness and numbness.  Hematological: Negative for adenopathy.   all other systems are negative except as noted in the HPI and PMH.     Physical Exam Updated Vital Signs BP 116/80 (BP Location: Right Arm)   Pulse (!) 108   Temp 97.6 F (36.4 C) (Oral)   Resp 18   Ht 5\' 7"  (1.702 m)   Wt 83 kg (183 lb)   SpO2 99%   BMI 28.66 kg/m   Physical Exam  Constitutional: She is oriented to person, place, and time. She appears well-developed and well-nourished. No distress.  uncomfortable  HENT:  Head: Normocephalic and atraumatic.  Mouth/Throat: Oropharynx is clear and moist. No oropharyngeal exudate.  Patient has weave in place which she reports cannot be removed. She has diffuse tenderness across occipital scalp  Eyes: Pupils are equal, round, and reactive to light. Conjunctivae and EOM are normal.  Neck: Neck supple.  No meningismus. Bilateral paraspinal lumbar tenderness, pain with range of motion Trapezius is nontender  Cardiovascular: Normal rate, regular rhythm, normal heart sounds and intact distal pulses.  No murmur  heard. Pulmonary/Chest: Effort normal and breath sounds normal. No respiratory distress. She exhibits no tenderness.  Abdominal: Soft. There is no tenderness. There is no rebound and no guarding.  Musculoskeletal: Normal range of motion. She exhibits no edema or tenderness.  Neurological: She is alert and oriented to person, place, and time. No cranial nerve deficit. She exhibits normal muscle tone. Coordination normal.  No ataxia on finger to nose bilaterally. No pronator drift. 5/5 strength throughout. CN 2-12 intact.Equal grip strength. Sensation intact.  Equal grip strength and radial pulses  Skin: Skin is warm.  Psychiatric: She has a normal mood and affect. Her behavior is normal.  Nursing note and vitals reviewed.    ED Treatments / Results  Labs (all labs ordered are listed, but only abnormal results are displayed) Labs Reviewed  I-STAT CHEM 8, ED -  Abnormal; Notable for the following components:      Result Value   Potassium 3.4 (*)    BUN 5 (*)    Glucose, Bld 133 (*)    Calcium, Ion 1.09 (*)    All other components within normal limits  I-STAT BETA HCG BLOOD, ED ([facility], [facility], [facility] ONLY)    EKG None  Radiology Ct Angio Head W Or Wo Contrast  Result Date: 02/10/2018 CLINICAL DATA:  Initial evaluation for acute neck pain. EXAM: CT ANGIOGRAPHY HEAD AND NECK TECHNIQUE: Multidetector CT imaging of the head and neck was performed using the standard protocol during bolus administration of intravenous contrast. Multiplanar CT image reconstructions and MIPs were obtained to evaluate the vascular anatomy. Carotid stenosis measurements (when applicable) are obtained utilizing NASCET criteria, using the distal internal carotid diameter as the denominator. CONTRAST:  <See Chart> ISOVUE-370 IOPAMIDOL (ISOVUE-370) INJECTION 76% COMPARISON:  Prior CT from 03/11/2013. FINDINGS: CT HEAD FINDINGS Brain: Cerebral volume within normal limits. No acute intracranial hemorrhage. No acute large vessel  territory infarct. No mass lesion, midline shift or mass effect. No hydrocephalus. No extra-axial fluid collection. Note made of an empty sella. Vascular: No hyperdense vessel. Skull: Scalp soft tissues and calvarium within normal limits. Sinuses: Visualized paranasal sinuses are clear. No mastoid effusion. Orbits: Globes and orbital soft tissues normal. Review of the MIP images confirms the above findings CTA NECK FINDINGS Aortic arch: Visualized aortic arch of normal caliber. Incidental note made of a bovine arch with common origin of the right brachiocephalic and left common carotid artery. No flow-limiting stenosis about the origin of the great vessels. Visualized subclavian arteries widely patent. Right carotid system: Right common and internal carotid arteries widely patent without stenosis, dissection or occlusion. No atheromatous narrowing about the right carotid bifurcation. Left carotid system: Left common and internal carotid arteries are widely patent without stenosis, dissection, or occlusion. No atheromatous narrowing about the left carotid bifurcation. Vertebral arteries: Both vertebral arteries arise from the subclavian arteries. Left vertebral artery dominant. Vertebral arteries widely patent within the neck without stenosis, dissection or occlusion. Skeleton: No acute osseous abnormality. No discrete lytic or blastic osseous lesions. Other neck: No acute soft tissue abnormality within the neck. Salivary glands normal. No adenopathy. Subcentimeter hypodense nodule noted within the inferior lobe of the right thyroid, doubtful significance. Thyroid otherwise unremarkable. Upper chest: Visualized upper chest demonstrates no acute abnormality. Scattered atelectatic changes noted within the visualized lungs. Review of the MIP images confirms the above findings CTA HEAD FINDINGS Anterior circulation: Internal carotid arteries widely patent to the termini without stenosis or occlusion. A1 segments,  anterior communicating artery common anterior cerebral arteries widely patent. MCAs well perfused without stenosis or occlusion. Distal MCA branches well opacified and symmetric. Posterior circulation: Vertebral arteries widely patent to the vertebrobasilar junction without stenosis. Left vertebral artery dominant. Posterior inferior cerebral arteries patent bilaterally. Basilar artery widely patent to its distal aspect without stenosis. Superior cerebellar and posterior cerebral arteries well perfused and widely patent bilaterally. Venous sinuses: Patent. Probable stenoses noted at the distal transverse sinuses bilaterally. Anatomic variants: None significant. No aneurysm or other vascular abnormality. Delayed phase: No abnormal enhancement. Review of the MIP images confirms the above findings IMPRESSION: 1. Negative CTA of the head and neck. No dissection or other acute vascular abnormality. No large vessel occlusion. No high-grade or correctable stenosis. 2. No acute intracranial abnormality. 3. Empty sella with stenoses involving the bilateral distal transverse sinuses. Constellation of findings can be seen in the setting  of idiopathic intracranial hypertension (pseudotumor cerebri). Electronically Signed   By: Jeannine Boga M.D.   On: 02/10/2018 03:21   Ct Angio Neck W And/or Wo Contrast  Result Date: 02/10/2018 CLINICAL DATA:  Initial evaluation for acute neck pain. EXAM: CT ANGIOGRAPHY HEAD AND NECK TECHNIQUE: Multidetector CT imaging of the head and neck was performed using the standard protocol during bolus administration of intravenous contrast. Multiplanar CT image reconstructions and MIPs were obtained to evaluate the vascular anatomy. Carotid stenosis measurements (when applicable) are obtained utilizing NASCET criteria, using the distal internal carotid diameter as the denominator. CONTRAST:  <See Chart> ISOVUE-370 IOPAMIDOL (ISOVUE-370) INJECTION 76% COMPARISON:  Prior CT from 03/11/2013.  FINDINGS: CT HEAD FINDINGS Brain: Cerebral volume within normal limits. No acute intracranial hemorrhage. No acute large vessel territory infarct. No mass lesion, midline shift or mass effect. No hydrocephalus. No extra-axial fluid collection. Note made of an empty sella. Vascular: No hyperdense vessel. Skull: Scalp soft tissues and calvarium within normal limits. Sinuses: Visualized paranasal sinuses are clear. No mastoid effusion. Orbits: Globes and orbital soft tissues normal. Review of the MIP images confirms the above findings CTA NECK FINDINGS Aortic arch: Visualized aortic arch of normal caliber. Incidental note made of a bovine arch with common origin of the right brachiocephalic and left common carotid artery. No flow-limiting stenosis about the origin of the great vessels. Visualized subclavian arteries widely patent. Right carotid system: Right common and internal carotid arteries widely patent without stenosis, dissection or occlusion. No atheromatous narrowing about the right carotid bifurcation. Left carotid system: Left common and internal carotid arteries are widely patent without stenosis, dissection, or occlusion. No atheromatous narrowing about the left carotid bifurcation. Vertebral arteries: Both vertebral arteries arise from the subclavian arteries. Left vertebral artery dominant. Vertebral arteries widely patent within the neck without stenosis, dissection or occlusion. Skeleton: No acute osseous abnormality. No discrete lytic or blastic osseous lesions. Other neck: No acute soft tissue abnormality within the neck. Salivary glands normal. No adenopathy. Subcentimeter hypodense nodule noted within the inferior lobe of the right thyroid, doubtful significance. Thyroid otherwise unremarkable. Upper chest: Visualized upper chest demonstrates no acute abnormality. Scattered atelectatic changes noted within the visualized lungs. Review of the MIP images confirms the above findings CTA HEAD FINDINGS  Anterior circulation: Internal carotid arteries widely patent to the termini without stenosis or occlusion. A1 segments, anterior communicating artery common anterior cerebral arteries widely patent. MCAs well perfused without stenosis or occlusion. Distal MCA branches well opacified and symmetric. Posterior circulation: Vertebral arteries widely patent to the vertebrobasilar junction without stenosis. Left vertebral artery dominant. Posterior inferior cerebral arteries patent bilaterally. Basilar artery widely patent to its distal aspect without stenosis. Superior cerebellar and posterior cerebral arteries well perfused and widely patent bilaterally. Venous sinuses: Patent. Probable stenoses noted at the distal transverse sinuses bilaterally. Anatomic variants: None significant. No aneurysm or other vascular abnormality. Delayed phase: No abnormal enhancement. Review of the MIP images confirms the above findings IMPRESSION: 1. Negative CTA of the head and neck. No dissection or other acute vascular abnormality. No large vessel occlusion. No high-grade or correctable stenosis. 2. No acute intracranial abnormality. 3. Empty sella with stenoses involving the bilateral distal transverse sinuses. Constellation of findings can be seen in the setting of idiopathic intracranial hypertension (pseudotumor cerebri). Electronically Signed   By: Jeannine Boga M.D.   On: 02/10/2018 03:21    Procedures Procedures (including critical care time)  Medications Ordered in ED Medications  valproate (DEPACON) 500 mg in dextrose  5 % 50 mL IVPB (500 mg Intravenous New Bag/Given 02/10/18 0533)  ketorolac (TORADOL) 30 MG/ML injection 30 mg (30 mg Intravenous Given 02/09/18 2335)  diazepam (VALIUM) tablet 5 mg (5 mg Oral Given 02/09/18 2335)  metoCLOPramide (REGLAN) injection 10 mg (10 mg Intravenous Given 02/09/18 2336)  diphenhydrAMINE (BENADRYL) injection 25 mg (25 mg Intravenous Given 02/09/18 2336)  HYDROmorphone (DILAUDID)  injection 0.5 mg (0.5 mg Intravenous Given 02/10/18 0254)  iopamidol (ISOVUE-370) 76 % injection 80 mL ( Intravenous Contrast Given 02/10/18 0117)  dexamethasone (DECADRON) injection 10 mg (10 mg Intravenous Given 02/10/18 0453)     Initial Impression / Assessment and Plan / ED Course  I have reviewed the triage vital signs and the nursing notes.  Pertinent labs & imaging results that were available during my care of the patient were reviewed by me and considered in my medical decision making (see chart for details).    Diffuse neck pain that extends into head.  Patient is neurovascularly intact.  No thunderclap onset.  Patient given p.o. Valium as well as Toradol and muscle relaxers.  Her neurological exam is intact.  She has no meningismus and no fever.  Suspect musculoskeletal spasm.  Patient remains tearful and anxious.  States the medications have not helped at all.  Will proceed with CT angiogram to evaluate for vertebral or carotid dissection or aneurysm.  Difficulty obtaining appropriate IV access. CT scan does not show any aneurysm or dissection.  On reassessment, patient is sleeping comfortably.  She is able to range her neck with some pain but no meningismus.  Low suspicion for subarachnoid hemorrhage or meningitis.  Discussed with patient the next step in her evaluation with the lumbar puncture though this is likely going to be reassuring.  She declines lumbar puncture today.  Presentation not consistent with bacterial meningitis.  We will treat supportively for likely musculoskeletal spasm. She is tolerating p.o. and ambulatory.  Follow-up with PCP and neurology.  Return precautions discussed.  Angiocath insertion Performed by: Ezequiel Essex  Consent: Verbal consent obtained. Risks and benefits: risks, benefits and alternatives were discussed Time out: Immediately prior to procedure a "time out" was called to verify the correct patient, procedure, equipment, support staff and  site/side marked as required.  Preparation: Patient was prepped and draped in the usual sterile fashion.  Vein Location: L basilic    Ultrasound Guided  Gauge: 20  Normal blood return and flush without difficulty Patient tolerance: Patient tolerated the procedure well with no immediate complications.     Final Clinical Impressions(s) / ED Diagnoses   Final diagnoses:  Torticollis, acute    ED Discharge Orders    None       Jentry Warnell, Annie Main, MD 02/10/18 [phone]

## 2018-02-09 NOTE — ED Triage Notes (Signed)
Pt states she had neck pain that started yesterday. Pt states that the pain has moved into the back of her head. Pt states it hurts all of the time and nothing makes the pain worse.

## 2018-02-10 ENCOUNTER — Encounter (HOSPITAL_COMMUNITY): Payer: Self-pay

## 2018-02-10 ENCOUNTER — Emergency Department (HOSPITAL_COMMUNITY): Payer: 59

## 2018-02-10 DIAGNOSIS — I6523 Occlusion and stenosis of bilateral carotid arteries: Secondary | ICD-10-CM | POA: Diagnosis not present

## 2018-02-10 LAB — I-STAT BETA HCG BLOOD, ED (MC, WL, AP ONLY): I-stat hCG, quantitative: <5 m[IU]/mL (ref <5)

## 2018-02-10 IMAGING — CT CT ANGIO NECK
2 of 11 series · 6 of 34 positions shown · IV contrast (iopamidol)
Comparison: Prior CT from [DATE].

CLINICAL DATA: Initial evaluation for acute neck pain.

EXAM:
CT ANGIOGRAPHY HEAD AND NECK
TECHNIQUE: Multidetector CT imaging of the head and neck was performed using
the standard protocol during bolus administration of intravenous
contrast. Multiplanar CT image reconstructions and MIPs were
obtained to evaluate the vascular anatomy. Carotid stenosis
measurements (when applicable) are obtained utilizing NASCET
criteria, using the distal internal carotid diameter as the
denominator.
CONTRAST:  <See Chart> [2N] IOPAMIDOL ([2N]) INJECTION
76%

[Series 6: sagittal soft tissue · sagittal · 0.31mm/px · 1 of 57 slices shown]
[im 20/57  soft-tissue]
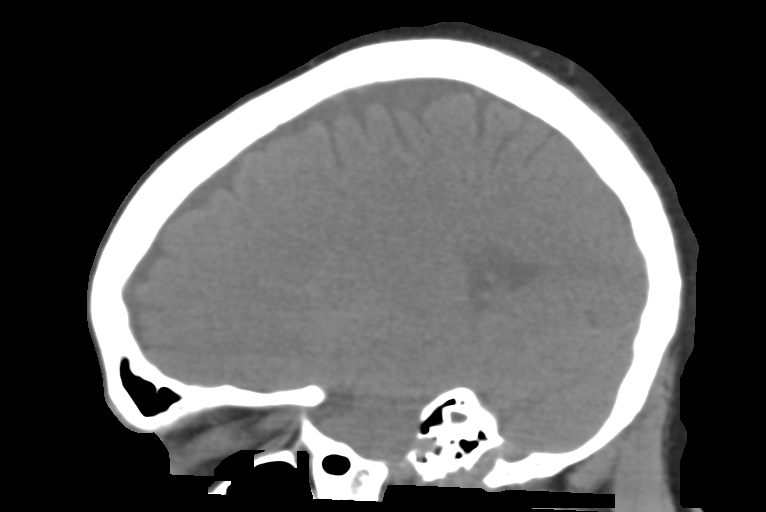

[Series 10: ax thin · axial · 0.53mm/px · z∈[+1036,+1252]mm · 5 of 324 slices shown]
[im 54/324  soft-tissue]
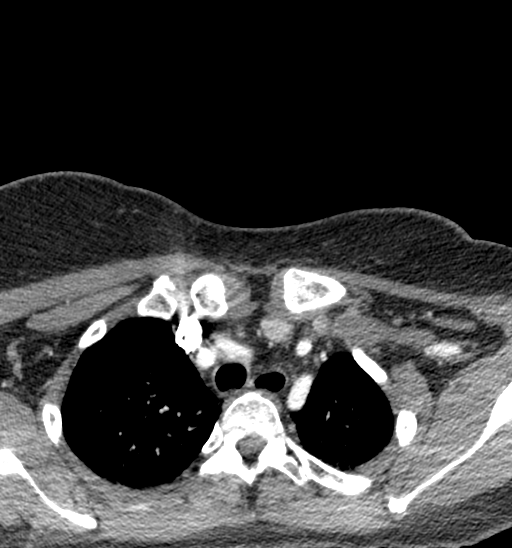
[im 108/324  bone]
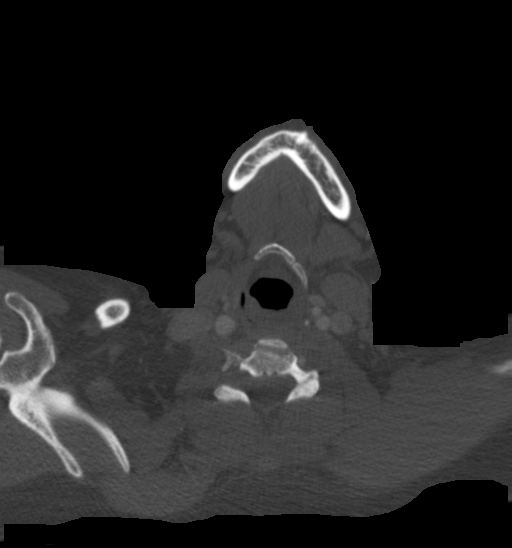
[im 162/324  soft-tissue]
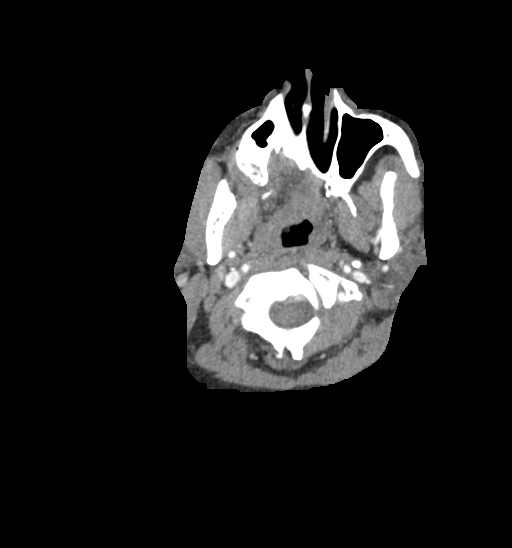
[im 216/324  bone]
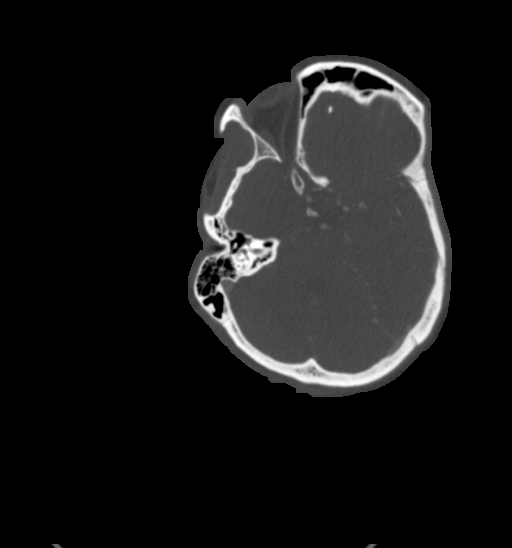
[im 270/324  soft-tissue]
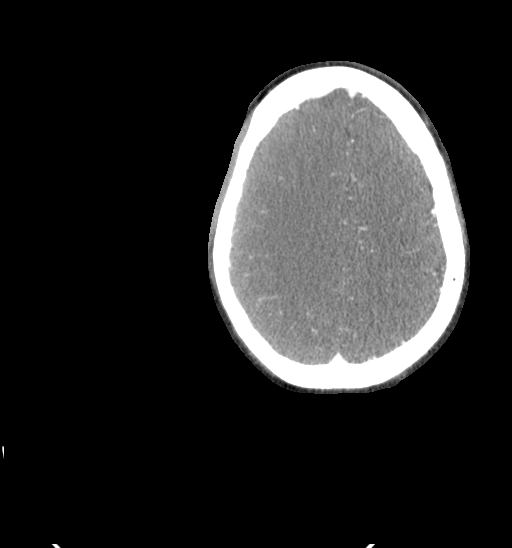

[6 of 34 positions shown; findings below may reference images not displayed]

FINDINGS: CT HEAD FINDINGS

Brain: Cerebral volume within normal limits. No acute intracranial
hemorrhage. No acute large vessel territory infarct. No mass lesion,
midline shift or mass effect. No hydrocephalus. No extra-axial fluid
collection. Note made of an empty sella.

Vascular: No hyperdense vessel.

Skull: Scalp soft tissues and calvarium within normal limits.

Sinuses: Visualized paranasal sinuses are clear. No mastoid
effusion.

Orbits: Globes and orbital soft tissues normal.

Review of the MIP images confirms the above findings

CTA NECK FINDINGS

Aortic arch: Visualized aortic arch of normal caliber. Incidental
note made of a bovine arch with common origin of the right
brachiocephalic and left common carotid artery. No flow-limiting
stenosis about the origin of the great vessels. Visualized
subclavian arteries widely patent.

Right carotid system: Right common and internal carotid arteries
widely patent without stenosis, dissection or occlusion. No
atheromatous narrowing about the right carotid bifurcation.

Left carotid system: Left common and internal carotid arteries are
widely patent without stenosis, dissection, or occlusion. No
atheromatous narrowing about the left carotid bifurcation.

Vertebral arteries: Both vertebral arteries arise from the
subclavian arteries. Left vertebral artery dominant. Vertebral
arteries widely patent within the neck without stenosis, dissection
or occlusion.

Skeleton: No acute osseous abnormality. No discrete lytic or blastic
osseous lesions.

Other neck: No acute soft tissue abnormality within the neck.
Salivary glands normal. No adenopathy. Subcentimeter hypodense
nodule noted within the inferior lobe of the right thyroid, doubtful
significance. Thyroid otherwise unremarkable.

Upper chest: Visualized upper chest demonstrates no acute
abnormality. Scattered atelectatic changes noted within the
visualized lungs.

Review of the MIP images confirms the above findings

CTA HEAD FINDINGS

Anterior circulation: Internal carotid arteries widely patent to the
termini without stenosis or occlusion. A1 segments, anterior
communicating artery common anterior cerebral arteries widely
patent. MCAs well perfused without stenosis or occlusion. Distal MCA
branches well opacified and symmetric.

Posterior circulation: Vertebral arteries widely patent to the
vertebrobasilar junction without stenosis. Left vertebral artery
dominant. Posterior inferior cerebral arteries patent bilaterally.
Basilar artery widely patent to its distal aspect without stenosis.
Superior cerebellar and posterior cerebral arteries well perfused
and widely patent bilaterally.

Venous sinuses: Patent. Probable stenoses noted at the distal
transverse sinuses bilaterally.

Anatomic variants: None significant. No aneurysm or other vascular
abnormality.

Delayed phase: No abnormal enhancement.

Review of the MIP images confirms the above findings
IMPRESSION: 1. Negative CTA of the head and neck. No dissection or other acute
vascular abnormality. No large vessel occlusion. No high-grade or
correctable stenosis.
2. No acute intracranial abnormality.
3. Empty sella with stenoses involving the bilateral distal
transverse sinuses. Constellation of findings can be seen in the
setting of idiopathic intracranial hypertension (pseudotumor
cerebri).

## 2018-02-10 MED ORDER — HYDROMORPHONE HCL 1 MG/ML IJ SOLN
0.5000 mg | Freq: Once | INTRAMUSCULAR | Status: AC
  Administered 2018-02-10: 0.5 mg via INTRAVENOUS
  Filled 2018-02-10: qty 1

## 2018-02-10 MED ORDER — IOPAMIDOL (ISOVUE-370) INJECTION 76%
80.0000 mL | Freq: Once | INTRAVENOUS | Status: AC | PRN
  Administered 2018-02-10: 01:00:00 via INTRAVENOUS

## 2018-02-10 MED ORDER — METHOCARBAMOL 500 MG PO TABS: 500.0000 mg | ORAL_TABLET | Freq: Three times a day (TID) | ORAL | 0 refills | Status: DC | PRN

## 2018-02-10 MED ORDER — VALPROATE SODIUM 500 MG/5ML IV SOLN
INTRAVENOUS | Status: AC
  Filled 2018-02-10: qty 5

## 2018-02-10 MED ORDER — NAPROXEN 500 MG PO TABS: 500.0000 mg | ORAL_TABLET | Freq: Two times a day (BID) | ORAL | 0 refills | Status: DC

## 2018-02-10 MED ORDER — DEXAMETHASONE SODIUM PHOSPHATE 4 MG/ML IJ SOLN
10.0000 mg | Freq: Once | INTRAMUSCULAR | Status: AC
  Administered 2018-02-10: 10 mg via INTRAVENOUS
  Filled 2018-02-10: qty 3

## 2018-02-10 MED ORDER — DEXTROSE 5 % IV SOLN
500.0000 mg | Freq: Once | INTRAVENOUS | Status: AC
  Administered 2018-02-10: 500 mg via INTRAVENOUS
  Filled 2018-02-10: qty 5

## 2018-02-10 NOTE — Discharge Instructions (Addendum)
Take the muscle relaxers and anti-inflammatories as prescribed.  Follow-up with your doctor.  Return to the ED if you develop new or worsening symptoms.

## 2018-02-13 DIAGNOSIS — R51 Headache: Secondary | ICD-10-CM | POA: Diagnosis not present

## 2018-02-13 DIAGNOSIS — Z09 Encounter for follow-up examination after completed treatment for conditions other than malignant neoplasm: Secondary | ICD-10-CM | POA: Diagnosis not present

## 2018-02-20 DIAGNOSIS — R51 Headache: Secondary | ICD-10-CM | POA: Diagnosis not present

## 2018-02-26 ENCOUNTER — Encounter: Payer: Self-pay | Admitting: Neurology

## 2018-03-08 DIAGNOSIS — R51 Headache: Secondary | ICD-10-CM | POA: Diagnosis not present

## 2018-03-08 DIAGNOSIS — E119 Type 2 diabetes mellitus without complications: Secondary | ICD-10-CM | POA: Diagnosis not present

## 2018-03-08 DIAGNOSIS — Z6831 Body mass index (BMI) 31.0-31.9, adult: Secondary | ICD-10-CM | POA: Diagnosis not present

## 2018-03-08 DIAGNOSIS — E669 Obesity, unspecified: Secondary | ICD-10-CM | POA: Diagnosis not present

## 2018-03-19 ENCOUNTER — Ambulatory Visit (INDEPENDENT_AMBULATORY_CARE_PROVIDER_SITE_OTHER): Payer: 59 | Admitting: Neurology

## 2018-03-19 ENCOUNTER — Encounter: Payer: Self-pay | Admitting: Neurology

## 2018-03-19 ENCOUNTER — Other Ambulatory Visit: Payer: Self-pay

## 2018-03-19 VITALS — BP 96/62 | HR 101 | Ht 67.0 in | Wt 198.0 lb

## 2018-03-19 DIAGNOSIS — G43009 Migraine without aura, not intractable, without status migrainosus: Secondary | ICD-10-CM | POA: Diagnosis not present

## 2018-03-19 DIAGNOSIS — F17200 Nicotine dependence, unspecified, uncomplicated: Secondary | ICD-10-CM

## 2018-03-19 MED ORDER — ONDANSETRON 4 MG PO TBDP: 4.0000 mg | ORAL_TABLET | Freq: Three times a day (TID) | ORAL | 2 refills | Status: DC | PRN

## 2018-03-19 MED ORDER — TOPIRAMATE 50 MG PO TABS: 50.0000 mg | ORAL_TABLET | Freq: Every day | ORAL | 2 refills | Status: DC

## 2018-03-19 MED ORDER — SUMATRIPTAN SUCCINATE 100 MG PO TABS: ORAL_TABLET | ORAL | 2 refills | Status: DC

## 2018-03-19 NOTE — Progress Notes (Addendum)
NEUROLOGY CONSULTATION NOTE  Virginia Douglas MRN: [id number] DOB: Dec 16, 1975  Referring provider: [name] Primary care provider: [name]  Reason for consult:  headache  HISTORY OF PRESENT ILLNESS: Virginia Douglas is a 42 year old right-handed female with diabetes and hypertension who presents for headache.  History supplemented by referring provider's note.  Onset:  Remote history of migraines.  Controlled for many years.  Returned in May. Location:  Right sided head/ear radiating down right side of neck.  Does not radiate down the right arm. Quality:  Pounding in head, burning in neck Intensity:  Severe.  She denies  thunderclap headache or severe headache that wakes her from sleep. Aura:  no Prodrome:  no Postdrome:  no Associated symptoms:  Nausea, vomiting, photophobia, phonophobia, blurred vision.  She denies associated unilateral numbness or weakness. Duration:  1 day Frequency:  2 days a week Frequency of abortive medication: 2 days a week Triggers/exacerbating factors:  stress Relieving factors:  Heating pad, Robaxin Activity:  Aggravates  She was evaluated in the ED on 02/10/18, where CTA of head and neck was performed and personally reviewed.  It demonstrated empty sella but otherwise unremarkable for mass lesion, aneurysm or dissection.  She is followed by ophthalmology.  Current NSAIDS:  no Current analgesics:  no Current triptans:  no Current anti-emetic:  no Current muscle relaxants:  Robaxin 500mg  Current anti-anxiolytic:  alprazolam Current sleep aide:  no Current Antihypertensive medications: lisinopril-HCTZ Current Antidepressant medications:  citalopram 20mg  Current Anticonvulsant medications:  no Current Vitamins/Herbal/Supplements:  no Current Antihistamines/Decongestants:  no Other therapy:  Heating pad  Past NSAIDS:  Ibuprofen, naproxen Past analgesics:  tramadol 50mg  Past abortive triptans:  no Past muscle relaxants:   Flexeril Past anti-emetic:  no Past antihypertensive medications:  no Past antidepressant medications:  nortriptyline 50mg  (caused nausea/vomiting, problems sleeping) Past anticonvulsant medications:  gabapentin 300mg  twice daily (for neuropathic pain), topiramate 25mg  (ineffective) Past vitamins/Herbal/Supplements:  no Past antihistamines/decongestants:  no Other past therapies:  no  Caffeine:  1 soda a week Alcohol:  no Smoker:  2 cigarettes a day Diet:  hydrates Exercise:  yes Depression:  yes; Anxiety:  yes Other pain:  no Sleep hygiene:  varies Family history of headache:  no  MRI of cervical spine from 09/22/15 was personally reviewed and was unremarkable. CMP from 12/09/17 was normal.   PAST MEDICAL HISTORY: Past Medical History:  Diagnosis Date  . Anemia    iron deficiency  . Diabetes ([place])   . Edema   . Migraine headache     PAST SURGICAL HISTORY: Past Surgical History:  Procedure Laterality Date  . ABDOMINAL HYSTERECTOMY    . CHOLECYSTECTOMY      MEDICATIONS: Current Outpatient Medications on File Prior to Visit  Medication Sig Dispense Refill  . ALPRAZolam (XANAX) 0.5 MG tablet Take 0.5 mg by mouth 2 (two) times daily as needed.  1  . amoxicillin (AMOXIL) 500 MG capsule Take 1 capsule (500 mg total) by mouth 3 (three) times daily. (Patient not taking: Reported on 02/09/2018) 21 capsule 0  . atorvastatin (LIPITOR) 10 MG tablet Take 10 mg by mouth daily.  0  . citalopram (CELEXA) 20 MG tablet Take 20 mg by mouth at bedtime.  1  . HYDROcodone-acetaminophen (NORCO/VICODIN) 5-325 MG tablet Take one tab po q 4 hrs prn pain (Patient not taking: Reported on 02/09/2018) 10 tablet 0  . lisinopril-hydrochlorothiazide (PRINZIDE,ZESTORETIC) 10-12.5 MG tablet Take 1 tablet by mouth daily.  0  . metFORMIN (GLUCOPHAGE-XR)  500 MG 24 hr tablet Take 2 tablets (1,000 mg total) by mouth 2 (two) times daily. 30 tablet 0  . methocarbamol (ROBAXIN) 500 MG tablet Take 1 tablet (500 mg  total) by mouth every 8 (eight) hours as needed for muscle spasms. 20 tablet 0  . naproxen (NAPROSYN) 500 MG tablet Take 1 tablet (500 mg total) by mouth 2 (two) times daily. (Patient not taking: Reported on 03/19/2018) 30 tablet 0  . omeprazole (PRILOSEC) 40 MG capsule Take 1 capsule by mouth daily.  10  . OZEMPIC 1 MG/DOSE SOPN Inject 1 mg into the skin every Sunday.   1  . phentermine (ADIPEX-P) 37.5 MG tablet Take 37.5 mg by mouth every morning.  1   No current facility-administered medications on file prior to visit.     ALLERGIES: No Known Allergies  FAMILY HISTORY: Family History  Problem Relation Age of Onset  . Diabetes Mother   . Hypertension Mother   . Diabetes Father   . Hypertension Father   . Kidney failure Father   . Hypertension Brother   . Diabetes Maternal Grandmother   . Breast cancer Maternal Grandmother   . Diabetes Paternal Grandmother   . Breast cancer Paternal Grandmother   . Cancer Maternal Aunt        breast  . Breast cancer Maternal Aunt     SOCIAL HISTORY: Social History   Socioeconomic History  . Marital status: Married    Spouse name: Virginia Douglas  . Number of children: 2  . Years of education: 47  . Highest education level: Some college, no degree  Occupational History  . 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[employer]  . Financial resource strain: Not on file  . Food insecurity:    Worry: Not on file    Inability: Not on file  . Transportation needs:    Medical: Not on file    Non-medical: Not on file  Tobacco Use  . Smoking status: Current Some Day Smoker    Packs/day: 0.50    Types: Cigarettes  . Smokeless tobacco: Never Used  Substance and Sexual Activity  . Alcohol use: No    Alcohol/week: 0.0 oz  . Drug use: No  . Sexual activity: Yes    Birth control/protection: Surgical  Lifestyle  . Physical activity:    Days per week: Not on file    Minutes per session: Not on file  . Stress: Not  on file  Relationships  . Social connections:    Talks on phone: Not on file    Gets together: Not on file    Attends religious service: Not on file    Active member of club or organization: Not on file    Attends meetings of clubs or organizations: Not on file    Relationship status: Not on file  . Intimate partner violence:    Fear of current or ex partner: Not on file    Emotionally abused: Not on file    Physically abused: Not on file    Forced sexual activity: Not on file  Other Topics Concern  . 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, does a lot of walking and standing on the job, walks for exercise   Caffeine use: Drinks tea (3 glasses per week)       REVIEW OF SYSTEMS: Constitutional: No fevers, chills, or sweats, no generalized fatigue, change in appetite Eyes: No visual  changes, double vision, eye pain Ear, nose and throat: No hearing loss, ear pain, nasal congestion, sore throat Cardiovascular: No chest pain, palpitations Respiratory:  No shortness of breath at rest or with exertion, wheezes GastrointestinaI: No nausea, vomiting, diarrhea, abdominal pain, fecal incontinence Genitourinary:  No dysuria, urinary retention or frequency Musculoskeletal:  Neck pain Integumentary: No rash, pruritus, skin lesions Neurological: as above Psychiatric: depression, anxiety Endocrine: No palpitations, fatigue, diaphoresis, mood swings, change in appetite, change in weight, increased thirst Hematologic/Lymphatic:  No purpura, petechiae. Allergic/Immunologic: no itchy/runny eyes, nasal congestion, recent allergic reactions, rashes  PHYSICAL EXAM: Vitals:   03/19/18 1012  BP: 96/62  Pulse: (!) 101  SpO2: 98%   General: No acute distress.  Patient appears well-groomed.  Head:  Normocephalic/atraumatic Eyes:  fundi examined but not visualized Neck: supple, bilateral paraspinal tenderness, full range of motion Back: No  paraspinal tenderness Heart: regular rate and rhythm Lungs: Clear to auscultation bilaterally. Vascular: No carotid bruits. Neurological Exam: Mental status: alert and oriented to person, place, and time, recent and remote memory intact, fund of knowledge intact, attention and concentration intact, speech fluent and not dysarthric, language intact. Cranial nerves: CN I: not tested CN II: pupils equal, round and reactive to light, visual fields intact CN III, IV, VI:  full range of motion, no nystagmus, no ptosis CN V: facial sensation intact CN VII: upper and lower face symmetric CN VIII: hearing intact CN IX, X: gag intact, uvula midline CN XI: sternocleidomastoid and trapezius muscles intact CN XII: tongue midline Bulk & Tone: normal, no fasciculations. Motor:  5/5 throughout  Sensation:  Pinprick sensation reduced in toes and vibration sensation intact. Deep Tendon Reflexes:  2+ throughout, toes downgoing.  Finger to nose testing:  Without dysmetria.  Heel to shin:  Without dysmetria.  Gait:  Normal station and stride.  Able to turn. Romberg negative.  IMPRESSION: Migraine without aura, without status migrainosus, not intractable Tobacco use CT showed empty sella, likely incidental finding.  She has been evaluated by ophthalmology and has not been told she has papilledema.  Will get records.  PLAN: 1.  She was previously on low dose of topiramate.  Start topiramate 50mg  at bedtime.  We can increase dose in 4 weeks if needed.  Advised not to get pregnant. 2.  For abortive therapy, sumatriptan 100mg , Robaxin and Zofran 4mg  3.  Limit use of pain relievers to no more than 2 days out of the week.  These medications include acetaminophen, ibuprofen, triptans and narcotics.  This will help reduce risk of rebound headaches. 4.  Be aware of common food triggers such as processed sweets, processed foods with nitrites (such as deli meat, hot dogs, sausages), foods with MSG, alcohol (such as  wine), chocolate, certain cheeses, certain fruits (dried fruits, bananas, some citrus fruit), vinegar, diet soda. 4.  Avoid caffeine 5.  Routine exercise 6.  Proper sleep hygiene 7.  Stay adequately hydrated with water 8.  Keep a headache diary. 9.  Maintain proper stress management. 10.  Do not skip meals. 11.  Consider supplements:  Magnesium citrate 400mg  to 600mg  daily, riboflavin 400mg , Coenzyme Q 10 100mg  three times daily 12.  Obtain records from ophthalmology 13.  Tobacco cessation counseling (CPT 99406):  Tobacco with no history of CAD, stroke, or cancer  - Currently smoking 2 cigarettes/day   - Patient was informed of the dangers of tobacco abuse including stroke, cancer, and MI, as well as benefits of tobacco cessation. - Patient is willing to quit  at this time. - Approximately 5 mins were spent counseling patient cessation techniques. We discussed various methods to help quit smoking, including deciding on a date to quit, joining a support group, pharmacological agents- nicotine gum/patch/lozenges, chantix.  - I will reassess her progress at the next follow-up visit 14.  Follow up in 3 months.   Thank you for allowing me to take part in the care of this patient.  Metta Clines, DO  CC:  [name]

## 2018-03-19 NOTE — Patient Instructions (Signed)
Migraine Recommendations: 1.  Start topiramate 50mg  at bedtime.  Contact me in 4 weeks with update and we can adjust dose if needed. 2.  Take sumatriptan 100mg  at earliest onset of headache.  May repeat dose once in 2 hours if needed.  Do not exceed two tablets in 24 hours.  You may continue the muscle relaxer as well.  For nausea, take ondansetron as directed. 3.  Limit use of pain relievers to no more than 2 days out of the week.  These medications include acetaminophen, ibuprofen, triptans and narcotics.  This will help reduce risk of rebound headaches. 4.  Be aware of common food triggers such as processed sweets, processed foods with nitrites (such as deli meat, hot dogs, sausages), foods with MSG, alcohol (such as wine), chocolate, certain cheeses, certain fruits (dried fruits, bananas, some citrus fruit), vinegar, diet soda. 4.  Avoid caffeine 5.  Routine exercise 6.  Proper sleep hygiene 7.  Stay adequately hydrated with water 8.  Keep a headache diary. 9.  Maintain proper stress management. 10.  Do not skip meals. 11.  Consider supplements:  Magnesium citrate 400mg  to 600mg  daily, riboflavin 400mg , Coenzyme Q 10 100mg  three times daily 12.  Follow up in 3 months.

## 2018-04-04 DIAGNOSIS — G932 Benign intracranial hypertension: Secondary | ICD-10-CM | POA: Diagnosis not present

## 2018-04-16 ENCOUNTER — Telehealth: Payer: Self-pay | Admitting: Neurology

## 2018-04-16 NOTE — Telephone Encounter (Signed)
Okay to increase topiramate to 100mg  and we can increase dose to 150mg  in 4 weeks if needed.

## 2018-04-16 NOTE — Telephone Encounter (Signed)
Patient called and is needing to have her  Sumatriptan increased. Her headaches are not any better. Thanks

## 2018-04-16 NOTE — Telephone Encounter (Signed)
Called and spoke with Pt, confirmed the medication she would like to increase is topiramate. Advised her of increase. Pt verbalized understanding.

## 2018-04-25 ENCOUNTER — Encounter

## 2018-04-25 ENCOUNTER — Institutional Professional Consult (permissible substitution): Payer: BLUE CROSS/BLUE SHIELD | Admitting: Neurology

## 2018-05-16 DIAGNOSIS — E669 Obesity, unspecified: Secondary | ICD-10-CM | POA: Diagnosis not present

## 2018-05-16 DIAGNOSIS — E119 Type 2 diabetes mellitus without complications: Secondary | ICD-10-CM | POA: Diagnosis not present

## 2018-05-16 DIAGNOSIS — R05 Cough: Secondary | ICD-10-CM | POA: Diagnosis not present

## 2018-05-16 DIAGNOSIS — R51 Headache: Secondary | ICD-10-CM | POA: Diagnosis not present

## 2018-06-04 ENCOUNTER — Other Ambulatory Visit: Payer: Self-pay | Admitting: Nurse Practitioner

## 2018-06-10 ENCOUNTER — Other Ambulatory Visit: Payer: Self-pay | Admitting: Neurology

## 2018-06-13 ENCOUNTER — Other Ambulatory Visit: Payer: Self-pay | Admitting: Nurse Practitioner

## 2018-07-06 ENCOUNTER — Encounter: Payer: Self-pay | Admitting: Nurse Practitioner

## 2018-07-06 DIAGNOSIS — G44209 Tension-type headache, unspecified, not intractable: Secondary | ICD-10-CM

## 2018-07-06 DIAGNOSIS — R519 Headache, unspecified: Secondary | ICD-10-CM | POA: Insufficient documentation

## 2018-07-06 DIAGNOSIS — R51 Headache: Secondary | ICD-10-CM

## 2018-07-08 ENCOUNTER — Ambulatory Visit (INDEPENDENT_AMBULATORY_CARE_PROVIDER_SITE_OTHER): Payer: 59 | Admitting: Nurse Practitioner

## 2018-07-08 ENCOUNTER — Encounter: Payer: Self-pay | Admitting: Nurse Practitioner

## 2018-07-08 VITALS — BP 120/78 | HR 87 | Temp 97.6°F | Ht 65.0 in | Wt 193.6 lb

## 2018-07-08 DIAGNOSIS — F419 Anxiety disorder, unspecified: Secondary | ICD-10-CM

## 2018-07-08 DIAGNOSIS — Z0000 Encounter for general adult medical examination without abnormal findings: Secondary | ICD-10-CM

## 2018-07-08 DIAGNOSIS — E119 Type 2 diabetes mellitus without complications: Secondary | ICD-10-CM

## 2018-07-08 DIAGNOSIS — I10 Essential (primary) hypertension: Secondary | ICD-10-CM | POA: Diagnosis not present

## 2018-07-08 DIAGNOSIS — Z720 Tobacco use: Secondary | ICD-10-CM

## 2018-07-08 DIAGNOSIS — Z1239 Encounter for other screening for malignant neoplasm of breast: Secondary | ICD-10-CM | POA: Diagnosis not present

## 2018-07-08 LAB — POCT URINALYSIS DIPSTICK
Blood, UA: NEGATIVE
Glucose, UA: NEGATIVE
Leukocytes, UA: NEGATIVE
Nitrite, UA: NEGATIVE
Protein, UA: POSITIVE — AB
Spec Grav, UA: >=1.03 — AB (ref 1.010–1.025)
Urobilinogen, UA: 1 E.U./dL
pH, UA: 5.5 (ref 5.0–8.0)

## 2018-07-08 LAB — POCT UA - MICROALBUMIN
Creatinine, POC: 300 mg/dL
Microalbumin Ur, POC: 80 mg/L

## 2018-07-08 MED ORDER — ATORVASTATIN CALCIUM 10 MG PO TABS: 10.0000 mg | ORAL_TABLET | Freq: Every day | ORAL | 1 refills | Status: DC

## 2018-07-08 MED ORDER — LISINOPRIL-HYDROCHLOROTHIAZIDE 10-12.5 MG PO TABS: 1.0000 | ORAL_TABLET | Freq: Every day | ORAL | 1 refills | Status: DC

## 2018-07-08 NOTE — Patient Instructions (Signed)

## 2018-07-08 NOTE — Progress Notes (Signed)
Subjective:     Patient ID: Virginia Douglas , female    DOB: Sep 05, 1975 , 42 y.o.   MRN: [id number]   Chief Complaint  Patient presents with  . Annual Exam    HPI  HPI  The patient states she uses status post hysterectomy for birth control. Last LMP was No LMP recorded. Patient has had a hysterectomy.. Negative for Dysmenorrhea and Negative for Menorrhagia Mammogram last done 07/04/2017.  Negative for: breast discharge, breast lump(s), breast pain and breast self exam.  Pertinent negatives include abnormal bleeding (hematology), anxiety, decreased libido, depression, difficulty falling sleep, dyspareunia, history of infertility, nocturia, sexual dysfunction, sleep disturbances, urinary incontinence, urinary urgency, vaginal discharge and vaginal itching. Diet regular.The patient states her exercise level is  2 times per week   The patient's tobacco use is:  Social History   Tobacco Use  Smoking Status Current Some Day Smoker  . Packs/day: 0.50  . Types: Cigarettes  Smokeless Tobacco Never Used  . She has been exposed to passive smoke. The patient's alcohol use is:  Social History   Substance and Sexual Activity  Alcohol Use No  . Alcohol/week: 0.0 standard drinks  . Additional information: Last pap Dr. Glo Herring in [place] - 2018, next one scheduled for 2021.   Past Medical History:  Diagnosis Date  . Anemia    iron deficiency  . Diabetes ([place])   . Edema   . Migraine headache      Family History  Problem Relation Age of Onset  . Diabetes Mother   . Hypertension Mother   . Diabetes Father   . Hypertension Father   . Kidney failure Father   . Hypertension Brother   . Diabetes Maternal Grandmother   . Breast cancer Maternal Grandmother   . Diabetes Paternal Grandmother   . Breast cancer Paternal Grandmother   . Cancer Maternal Aunt        breast  . Breast cancer Maternal Aunt      Current Outpatient Medications:  .  acetaZOLAMIDE (DIAMOX) 250 MG tablet,  TAKE 1 TABLET BY MOUTH EVERY DAY, Disp: 90 tablet, Rfl: 0 .  ALPRAZolam (XANAX) 0.5 MG tablet, TAKE 1 TABLET BY MOUTH TWICE A DAY AS NEEDED, Disp: 30 tablet, Rfl: 0 .  atorvastatin (LIPITOR) 10 MG tablet, Take 10 mg by mouth daily., Disp: , Rfl: 0 .  Blood Glucose Monitoring Suppl (ONETOUCH VERIO IQ SYSTEM) w/Device KIT, by Does not apply route. Use to check blood sugars twice, Disp: , Rfl:  .  citalopram (CELEXA) 20 MG tablet, Take 20 mg by mouth at bedtime., Disp: , Rfl: 1 .  glucose blood (ONETOUCH VERIO) test strip, 1 each by Other route as needed for other. Insert 1 by subcutaneous route 2 times every day check blood sugar before breakfast and dinner, Disp: , Rfl:  .  lisinopril-hydrochlorothiazide (PRINZIDE,ZESTORETIC) 10-12.5 MG tablet, TAKE 1 TABLET BY MOUTH EVERY DAY, Disp: 90 tablet, Rfl: 1 .  metFORMIN (GLUCOPHAGE-XR) 500 MG 24 hr tablet, Take 2 tablets (1,000 mg total) by mouth 2 (two) times daily., Disp: 30 tablet, Rfl: 0 .  methocarbamol (ROBAXIN) 500 MG tablet, TAKE 1 TABLET BY MOUTH 2 TIMES EVERY DAY AS NEEDED FOR MUSCLE STRAIN/SPASM, Disp: 30 tablet, Rfl: 0 .  ondansetron (ZOFRAN ODT) 4 MG disintegrating tablet, Take 1 tablet (4 mg total) by mouth every 8 (eight) hours as needed for nausea or vomiting., Disp: 20 tablet, Rfl: 2 .  OZEMPIC 1 MG/DOSE SOPN, Inject 1 mg into the  skin every Sunday. , Disp: , Rfl: 1 .  phentermine (ADIPEX-P) 37.5 MG tablet, Take 37.5 mg by mouth every morning., Disp: , Rfl: 1 .  SUMAtriptan (IMITREX) 100 MG tablet, Take 1 tablet earliest onset of migraine.  May repeat once in 2 hours if headache persists or recurs.  Do not exceed 2 tablets in 24 hours, Disp: 10 tablet, Rfl: 2 .  topiramate (TOPAMAX) 50 MG tablet, TAKE 1 TABLET BY MOUTH EVERYDAY AT BEDTIME, Disp: 90 tablet, Rfl: 1 .  acyclovir (ZOVIRAX) 400 MG tablet, Take 400 mg by mouth every 8 (eight) hours., Disp: , Rfl:    No Known Allergies   Review of Systems  Constitutional: Negative.   HENT:  Negative.   Eyes: Negative.   Respiratory: Negative.   Cardiovascular: Negative.   Gastrointestinal: Negative.   Endocrine: Negative.   Genitourinary: Negative.   Musculoskeletal: Negative.   Skin: Negative.   Allergic/Immunologic: Negative.   Neurological: Negative.   Hematological: Negative.   Psychiatric/Behavioral: Negative.      Today's Vitals   07/08/18 1442  BP: 120/78  Pulse: 87  Temp: 97.6 F (36.4 C)  TempSrc: Oral  SpO2: 91%  Weight: 193 lb 9.6 oz (87.8 kg)  Height: _0  (1.651 m)  PainSc: 2   PainLoc: Head   Body mass index is 32.22 kg/m.   Objective:  Physical Exam  Constitutional: She is oriented to person, place, and time. She appears well-developed and well-nourished.  Eyes: Pupils are equal, round, and reactive to light. Conjunctivae and EOM are normal.  Neck: Normal range of motion. Neck supple.  Cardiovascular: Normal rate, regular rhythm, normal heart sounds and intact distal pulses.  Abdominal: Soft. Bowel sounds are normal.  Musculoskeletal: Normal range of motion.  Neurological: She is alert and oriented to person, place, and time.  Skin: Skin is warm and dry. Capillary refill takes less than 2 seconds.  Psychiatric: She has a normal mood and affect. Her behavior is normal.        Assessment And Plan:   1. Health maintenance examination . Behavior modifications discussed and diet history reviewed.   . Pt will continue to exercise regularly and modify diet with low GI, plant based foods and decrease intake of processed foods.  . Recommend intake of daily multivitamin, Vitamin D, and calcium.  . Recommend mammogram and colonoscopy for preventive screenings, as well as recommend immunizations that include influenza, TDAP, and Shingles . She reports her weight at home was 188 lbs yesterday - CBC no Diff - CMP14 + Anion Gap  2. Type 2 diabetes mellitus without complication, without long-term current use of insulin (HCC)  Chronic,  controlled  Continue with current medications  Diabetic foot exam done with decreased sensation to right dorsal foot - atorvastatin (LIPITOR) 10 MG tablet; Take 1 tablet (10 mg total) by mouth daily.  Dispense: 90 tablet; Refill: 1 - CMP14 + Anion Gap - Hemoglobin A1c - Lipid Profile  3. Essential hypertension  Chronic, controlled  Continue with current medications - lisinopril-hydrochlorothiazide (PRINZIDE,ZESTORETIC) 10-12.5 MG tablet; Take 1 tablet by mouth daily.  Dispense: 90 tablet; Refill: 1  4. Encounter for screening for malignant neoplasm of breast  - MM Digital Screening; Future  5. Anxiety  Chronic, controlled, doing better  Continue with current medications      [name], FNP

## 2018-07-12 DIAGNOSIS — Z0000 Encounter for general adult medical examination without abnormal findings: Secondary | ICD-10-CM | POA: Diagnosis not present

## 2018-07-12 DIAGNOSIS — E119 Type 2 diabetes mellitus without complications: Secondary | ICD-10-CM | POA: Diagnosis not present

## 2018-07-13 LAB — CMP14 + ANION GAP
ALT: 21 IU/L (ref 0–32)
AST: 22 IU/L (ref 0–40)
Albumin/Globulin Ratio: 1.7 (ref 1.2–2.2)
Albumin: 4.7 g/dL (ref 3.5–5.5)
Alkaline Phosphatase: 115 IU/L (ref 39–117)
Anion Gap: 17 mmol/L (ref 10.0–18.0)
BUN/Creatinine Ratio: 10 (ref 9–23)
BUN: 7 mg/dL (ref 6–24)
Bilirubin Total: 0.2 mg/dL (ref 0.0–1.2)
CO2: 20 mmol/L (ref 20–29)
Calcium: 9.8 mg/dL (ref 8.7–10.2)
Chloride: 105 mmol/L (ref 96–106)
Creatinine, Ser: 0.7 mg/dL (ref 0.57–1.00)
GFR calc Af Amer: 124 mL/min/{1.73_m2} (ref >59)
GFR calc non Af Amer: 108 mL/min/{1.73_m2} (ref >59)
Globulin, Total: 2.7 g/dL (ref 1.5–4.5)
Glucose: 87 mg/dL (ref 65–99)
Potassium: 3.6 mmol/L (ref 3.5–5.2)
Sodium: 142 mmol/L (ref 134–144)
Total Protein: 7.4 g/dL (ref 6.0–8.5)

## 2018-07-13 LAB — LIPID PANEL
Chol/HDL Ratio: 2.5 ratio (ref 0.0–4.4)
Cholesterol, Total: 130 mg/dL (ref 100–199)
HDL: 52 mg/dL (ref >39)
LDL Calculated: 62 mg/dL (ref 0–99)
Triglycerides: 79 mg/dL (ref 0–149)
VLDL Cholesterol Cal: 16 mg/dL (ref 5–40)

## 2018-07-13 LAB — CBC
Hematocrit: 36.5 % (ref 34.0–46.6)
Hemoglobin: 12.8 g/dL (ref 11.1–15.9)
MCH: 29.7 pg (ref 26.6–33.0)
MCHC: 35.1 g/dL (ref 31.5–35.7)
MCV: 85 fL (ref 79–97)
Platelets: 328 10*3/uL (ref 150–450)
RBC: 4.31 x10E6/uL (ref 3.77–5.28)
RDW: 14.1 % (ref 12.3–15.4)
WBC: 11 10*3/uL — ABNORMAL HIGH (ref 3.4–10.8)

## 2018-07-13 LAB — HEMOGLOBIN A1C
Est. average glucose Bld gHb Est-mCnc: 123 mg/dL
Hgb A1c MFr Bld: 5.9 % — ABNORMAL HIGH (ref 4.8–5.6)

## 2018-07-16 NOTE — Progress Notes (Signed)
NEUROLOGY FOLLOW UP OFFICE NOTE  Virginia Douglas [id number]  HISTORY OF PRESENT ILLNESS: Virginia Douglas is a 42 year old right-handed female with diabetes and hypertension who follows up for migraines.  UPDATE: Intensity:  Severe Duration:  1 day.  One time, she had a two-week bout. Frequency:  Every other day Frequency of abortive medication: 2 days out of week Rescue protocol:  Sumatriptan 179m, Zofran 42m Robaxin Current NSAIDS:  none Current analgesics:  none Current triptans: Sumatriptan 100 mg Current ergotamine: None Current anti-emetic: Zofran 4 mg Current muscle relaxants: Robaxin Current anti-anxiolytic:  alprazolam Current sleep aide:  none Current Antihypertensive medications:  Lisinopril-HCTZ Current Antidepressant medications:  Citalopram 2016murrent Anticonvulsant medications: Topiramate 100 mg at bedtime Current anti-CGRP:  none Current Vitamins/Herbal/Supplements:  none Current Antihistamines/Decongestants:  none Other therapy:  none Hormone/birth control:  no  Caffeine: One soda a week Diet: Hydrates Exercise: Yes Depression: Yes; Anxiety: Yes Other pain: No Sleep hygiene: Varies  HISTORY:  Onset: Remote history of migraines.  Controlled for many years.  Returned in May 2019. Location:  Right sided head/ear radiating down right side of neck.  Does not radiate down the right arm. Quality:  Pounding in head, burning in neck Initial intensity:  Severe.  She denies  thunderclap headache or severe headache that wakes her from sleep. Aura:  no Prodrome:  no Postdrome:  no Associated symptoms: Nausea, vomiting, photophobia, phonophobia, blurred vision.  She denies associated unilateral numbness or weakness. Initial duration:  1 day Initial Frequency:  2 days a week Initial Frequency of abortive medication: 2 days a week Triggers: Emotional stress Relieving factors:  Heating pad, Robaxin Activity:  Aggravates  She was evaluated in the ED on  02/10/18, where CTA of head and neck was performed and personally reviewed.  It demonstrated empty sella but otherwise unremarkable for mass lesion, aneurysm or dissection.  She is followed by ophthalmology.  Past NSAIDS:  Ibuprofen, naproxen Past analgesics:  tramadol 58m67mst abortive triptans:  no Past muscle relaxants:  Flexeril Past anti-emetic:  no Past antihypertensive medications:  no Past antidepressant medications:  nortriptyline 58mg62mused nausea/vomiting, problems sleeping) Past anticonvulsant medications:  gabapentin 300mg 44me daily (for neuropathic pain), topiramate 25mg (30mfective) Past vitamins/Herbal/Supplements:  no Past antihistamines/decongestants:  no Other past therapies:  no  Family history of headache:  no  MRI of cervical spine from 09/22/15 was personally reviewed and was unremarkable.  PAST MEDICAL HISTORY: Past Medical History:  Diagnosis Date  . Anemia    iron deficiency  . Diabetes (HCC)   Olivarezdema   . Migraine headache     MEDICATIONS: Current Outpatient Medications on File Prior to Visit  Medication Sig Dispense Refill  . acetaZOLAMIDE (DIAMOX) 250 MG tablet TAKE 1 TABLET BY MOUTH EVERY DAY 90 tablet 0  . acyclovir (ZOVIRAX) 400 MG tablet Take 400 mg by mouth every 8 (eight) hours.    . ALPRAZolam (XANAX) 0.5 MG tablet TAKE 1 TABLET BY MOUTH TWICE A DAY AS NEEDED 30 tablet 0  . atorvastatin (LIPITOR) 10 MG tablet Take 1 tablet (10 mg total) by mouth daily. 90 tablet 1  . Blood Glucose Monitoring Suppl (ONETOUCH VERIO IQ SYSTEM) w/Device KIT by Does not apply route. Use to check blood sugars twice    . citalopram (CELEXA) 20 MG tablet Take 20 mg by mouth at bedtime.  1  . glucose blood (ONETOUCH VERIO) test strip 1 each by Other route as needed for other. Insert 1 by subcutaneous route 2  times every day check blood sugar before breakfast and dinner    . lisinopril-hydrochlorothiazide (PRINZIDE,ZESTORETIC) 10-12.5 MG tablet Take 1 tablet by  mouth daily. 90 tablet 1  . metFORMIN (GLUCOPHAGE-XR) 500 MG 24 hr tablet Take 2 tablets (1,000 mg total) by mouth 2 (two) times daily. 30 tablet 0  . methocarbamol (ROBAXIN) 500 MG tablet TAKE 1 TABLET BY MOUTH 2 TIMES EVERY DAY AS NEEDED FOR MUSCLE STRAIN/SPASM 30 tablet 0  . ondansetron (ZOFRAN ODT) 4 MG disintegrating tablet Take 1 tablet (4 mg total) by mouth every 8 (eight) hours as needed for nausea or vomiting. 20 tablet 2  . OZEMPIC 1 MG/DOSE SOPN Inject 1 mg into the skin every Sunday.   1  . phentermine (ADIPEX-P) 37.5 MG tablet Take 37.5 mg by mouth every morning.  1  . SUMAtriptan (IMITREX) 100 MG tablet Take 1 tablet earliest onset of migraine.  May repeat once in 2 hours if headache persists or recurs.  Do not exceed 2 tablets in 24 hours 10 tablet 2  . topiramate (TOPAMAX) 50 MG tablet TAKE 1 TABLET BY MOUTH EVERYDAY AT BEDTIME 90 tablet 1   No current facility-administered medications on file prior to visit.     ALLERGIES: No Known Allergies  FAMILY HISTORY: Family History  Problem Relation Age of Onset  . Diabetes Mother   . Hypertension Mother   . Diabetes Father   . Hypertension Father   . Kidney failure Father   . Hypertension Brother   . Diabetes Maternal Grandmother   . Breast cancer Maternal Grandmother   . Diabetes Paternal Grandmother   . Breast cancer Paternal Grandmother   . Cancer Maternal Aunt        breast  . Breast cancer Maternal Aunt    SOCIAL HISTORY: Social History   Socioeconomic History  . Marital status: Legally Separated    Spouse name: Virginia Douglas  . Number of children: 2  . Years of education: 52  . Highest education level: Some college, no degree  Occupational History  . 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[employer]  . Financial resource strain: Not on file  . Food insecurity:    Worry: Not on file    Inability: Not on file  . Transportation needs:    Medical: Not on file    Non-medical:  Not on file  Tobacco Use  . Smoking status: Current Some Day Smoker    Packs/day: 0.50    Types: Cigarettes  . Smokeless tobacco: Never Used  Substance and Sexual Activity  . Alcohol use: No    Alcohol/week: 0.0 standard drinks  . Drug use: No  . Sexual activity: Yes    Birth control/protection: Surgical  Lifestyle  . Physical activity:    Days per week: Not on file    Minutes per session: Not on file  . Stress: Not on file  Relationships  . Social connections:    Talks on phone: Not on file    Gets together: Not on file    Attends religious service: Not on file    Active member of club or organization: Not on file    Attends meetings of clubs or organizations: Not on file    Relationship status: Not on file  . Intimate partner violence:    Fear of current or ex partner: Not on file    Emotionally abused: Not on file    Physically abused: Not on file    Forced  sexual activity: Not on file  Other Topics Concern  . 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 She lives with her 2 children in a one story house. She drinks one large cup of coffee a day and an occasional tea or soda. She walks daily.       REVIEW OF SYSTEMS: Constitutional: No fevers, chills, or sweats, no generalized fatigue, change in appetite Eyes: No visual changes, double vision, eye pain Ear, nose and throat: No hearing loss, ear pain, nasal congestion, sore throat Cardiovascular: No chest pain, palpitations Respiratory:  No shortness of breath at rest or with exertion, wheezes GastrointestinaI: No nausea, vomiting, diarrhea, abdominal pain, fecal incontinence Genitourinary:  No dysuria, urinary retention or frequency Musculoskeletal:  No neck pain, back pain Integumentary: No rash, pruritus, skin  lesions Neurological: as above Psychiatric: No depression, insomnia, anxiety Endocrine: No palpitations, fatigue, diaphoresis, mood swings, change in appetite, change in weight, increased thirst Hematologic/Lymphatic:  No purpura, petechiae. Allergic/Immunologic: no itchy/runny eyes, nasal congestion, recent allergic reactions, rashes  PHYSICAL EXAM: Blood pressure (!) 96/58, pulse 94, height _0  (1.676 m), weight 191 lb (86.6 kg), SpO2 98 %. General: No acute distress.  Patient appears well-groomed.  Head:  Normocephalic/atraumatic Eyes:  Fundi examined but not visualized Neck: supple, no paraspinal tenderness, full range of motion Heart:  Regular rate and rhythm Lungs:  Clear to auscultation bilaterally Back: No paraspinal tenderness Neurological Exam: alert and oriented to person, place, and time. Attention span and concentration intact, recent and remote memory intact, fund of knowledge intact.  Speech fluent and not dysarthric, language intact.  CN II-XII intact. Bulk and tone normal, muscle strength 5/5 throughout.  Sensation to light touch intact.  Deep tendon reflexes 2+ throughout, toes downgoing.  Finger to nose and heel to shin testing intact.  Gait normal, Romberg negative.  IMPRESSION: Chronic migraine without aura, without status migrainosus, not intractable  PLAN: 1.  We will start Aimovig 56m monthly.  She will continue topiramate 1023mat bedtime for now.  If headaches improve on Aimovig, then we can taper off of topiramate. 2.  Stop sumatriptan.  We will try rizatriptan 1013mor abortive therapy.  Zofran for nausea 3.  Limit use of pain relievers to no more than 2 days out of week to prevent risk of rebound or medication-overuse headache. 4.  Keep headache diary 5.  Follow up in 4 months.  AdaMetta ClinesO  CC:  JanMinette BrineNP

## 2018-07-17 ENCOUNTER — Ambulatory Visit (INDEPENDENT_AMBULATORY_CARE_PROVIDER_SITE_OTHER): Payer: 59 | Admitting: Neurology

## 2018-07-17 ENCOUNTER — Other Ambulatory Visit: Payer: Self-pay | Admitting: Neurology

## 2018-07-17 ENCOUNTER — Encounter: Payer: Self-pay | Admitting: Neurology

## 2018-07-17 VITALS — BP 96/58 | HR 94 | Ht 66.0 in | Wt 191.0 lb

## 2018-07-17 DIAGNOSIS — G43709 Chronic migraine without aura, not intractable, without status migrainosus: Secondary | ICD-10-CM | POA: Diagnosis not present

## 2018-07-17 MED ORDER — ERENUMAB-AOOE 70 MG/ML ~~LOC~~ SOAJ: 70.0000 mg | SUBCUTANEOUS | 11 refills | Status: DC

## 2018-07-17 MED ORDER — RIZATRIPTAN BENZOATE 10 MG PO TBDP: ORAL_TABLET | ORAL | 3 refills | Status: DC

## 2018-07-17 NOTE — Patient Instructions (Addendum)
1.  We will start Aimovig 2.  Continue topiramate 100mg  at bedtime for now. 3.  Stop sumatriptan.  Instead, try rizatriptan 10mg  at earliest onset of migraine.  May repeat once after 2 hours if needed.  No more than 2 tablets in 24 hours. 3.  Zofran for nausea 4.  Limit use of pain relievers to no more than 2 days out of week to prevent risk of rebound or medication-overuse headache. 5.  Keep headache diary 6.  Follow up in 4 months.

## 2018-07-22 NOTE — Progress Notes (Signed)
Prior Authorization initiated via [email or web address] for pt's   Erenumab-aooe (AIMOVIG) 70 MG/ML SOAJ

## 2018-07-24 ENCOUNTER — Encounter: Payer: Self-pay | Admitting: Nurse Practitioner

## 2018-07-24 ENCOUNTER — Other Ambulatory Visit: Payer: Self-pay | Admitting: Nurse Practitioner

## 2018-07-24 DIAGNOSIS — E6609 Other obesity due to excess calories: Secondary | ICD-10-CM

## 2018-07-24 MED ORDER — PHENTERMINE HCL 37.5 MG PO TABS: 37.5000 mg | ORAL_TABLET | Freq: Every morning | ORAL | 1 refills | Status: DC

## 2018-07-24 NOTE — Progress Notes (Signed)
Received notice from [facility] that pt's AIMOVIG has been DENIED.  Stating that pt did not meet requirements due to not trying and failing 2+ medications, and pt does not have a clinical condition for which there is no appropriate formulary alternative.

## 2018-08-16 ENCOUNTER — Other Ambulatory Visit: Payer: Self-pay | Admitting: Nurse Practitioner

## 2018-08-19 ENCOUNTER — Ambulatory Visit
Admission: RE | Admit: 2018-08-19 | Discharge: 2018-08-19 | Disposition: A | Discharge status: Home / Self Care | Payer: 59 | Source: Ambulatory Visit | Attending: Nurse Practitioner | Admitting: Nurse Practitioner

## 2018-08-19 DIAGNOSIS — Z1239 Encounter for other screening for malignant neoplasm of breast: Secondary | ICD-10-CM

## 2018-08-19 DIAGNOSIS — Z1231 Encounter for screening mammogram for malignant neoplasm of breast: Secondary | ICD-10-CM | POA: Diagnosis not present

## 2018-08-19 IMAGING — MG DIGITAL SCREENING BILATERAL MAMMOGRAM WITH CAD
4 series · 4 of 4 positions shown · non-contrast
Comparison: Previous exam(s).

CLINICAL DATA: Screening.

EXAM:
DIGITAL SCREENING BILATERAL MAMMOGRAM WITH CAD

[L CC]
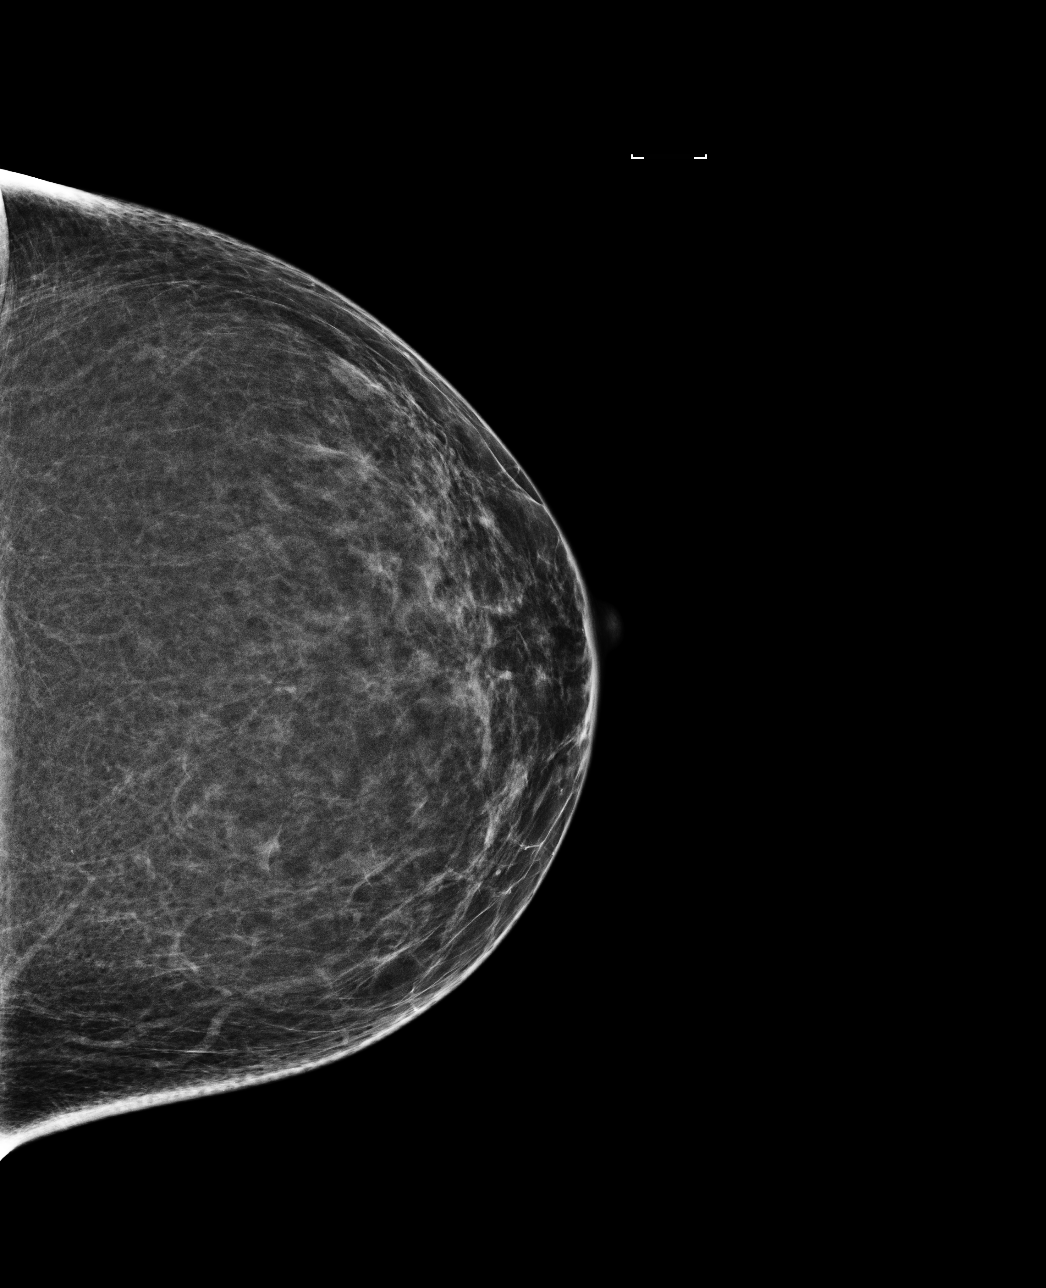

[R CC]
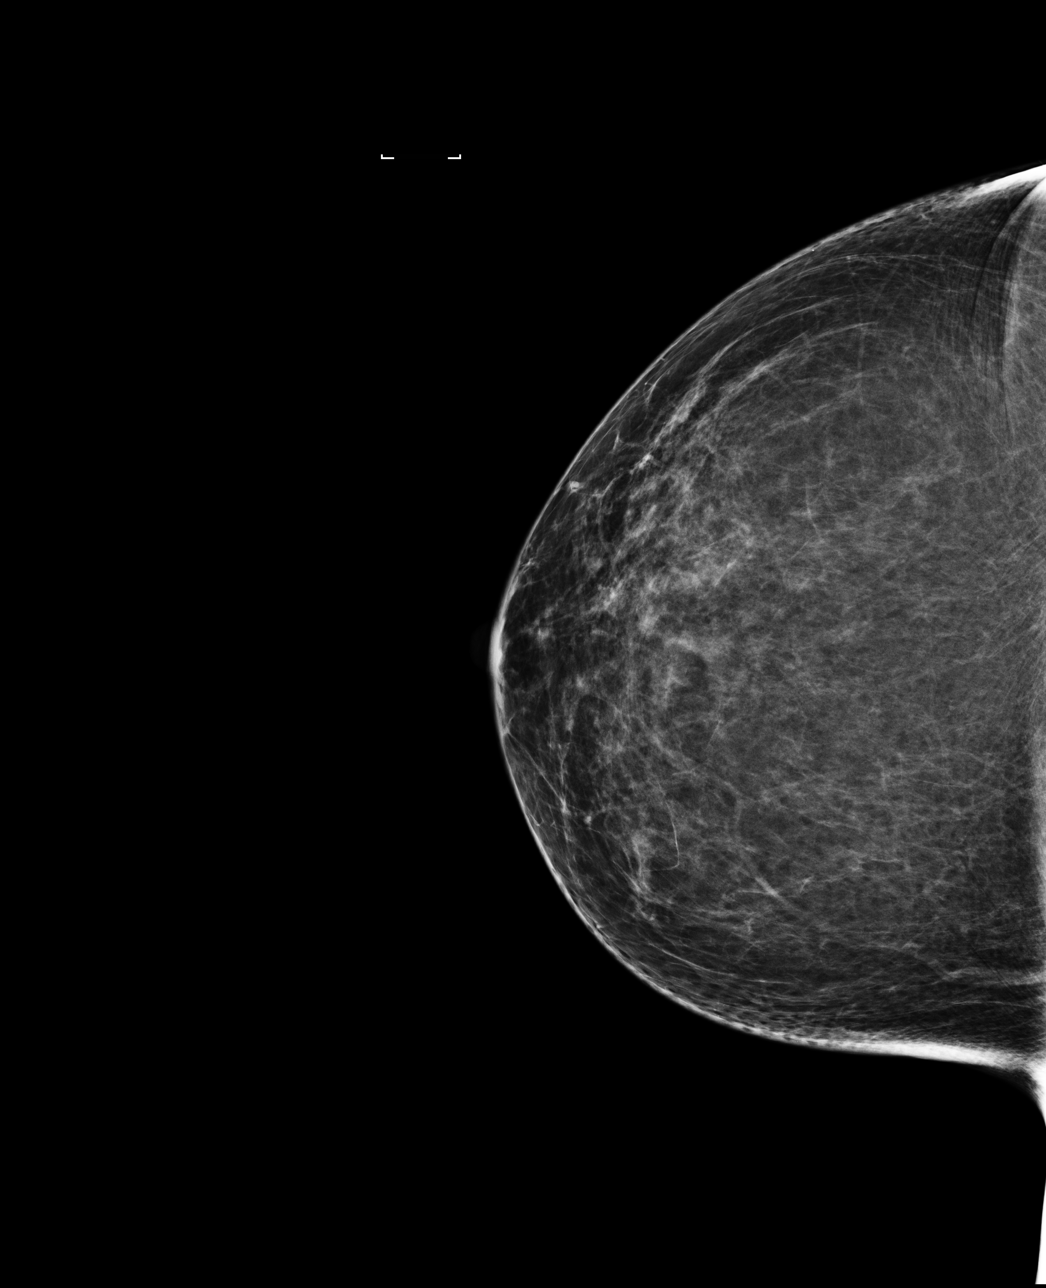

[R MLO]
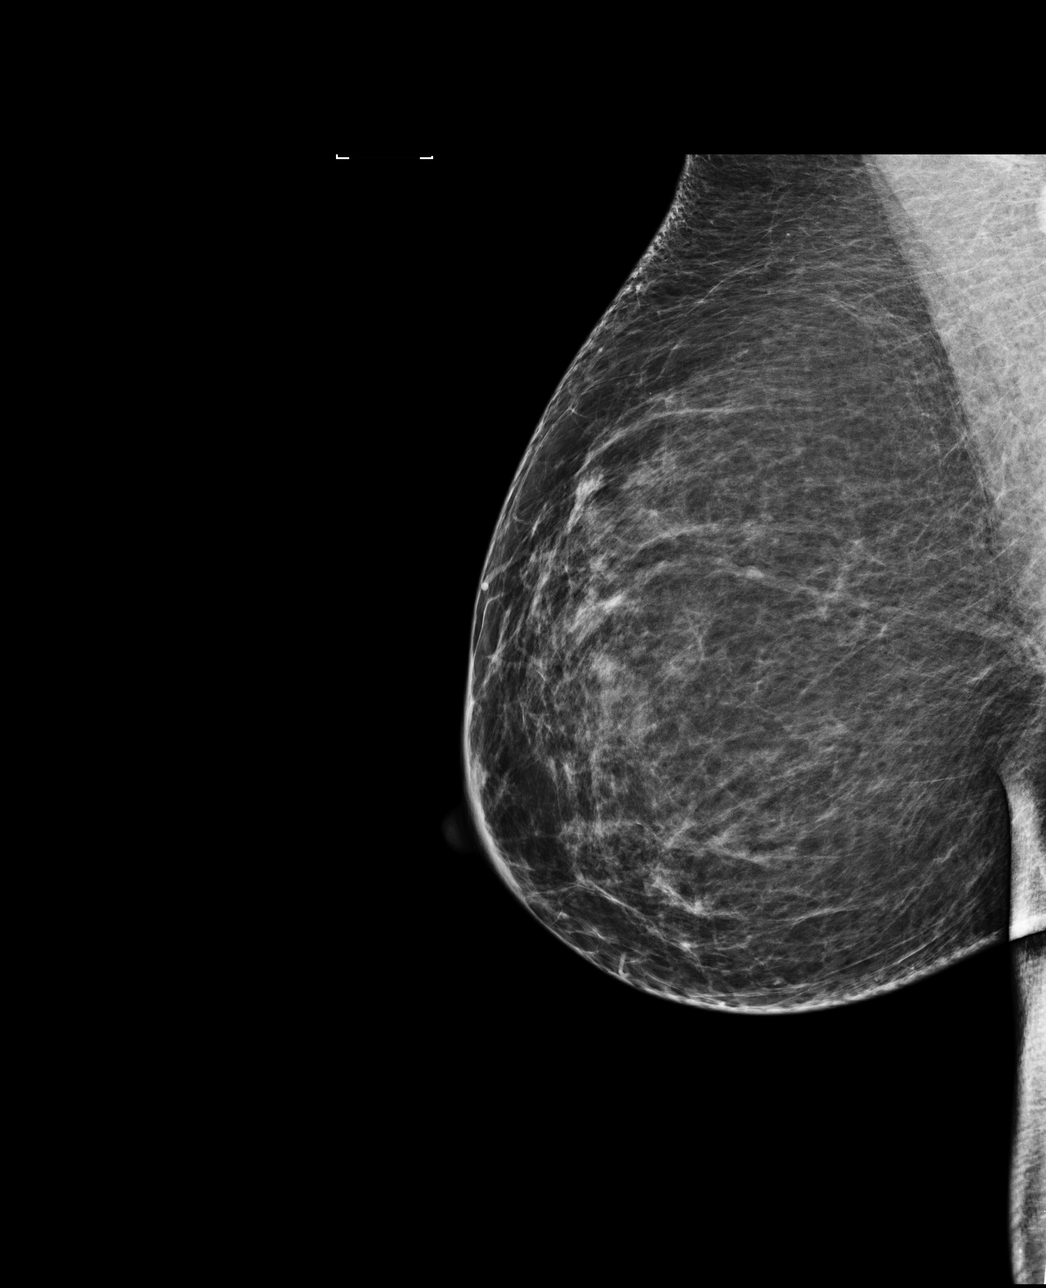

[L MLO]
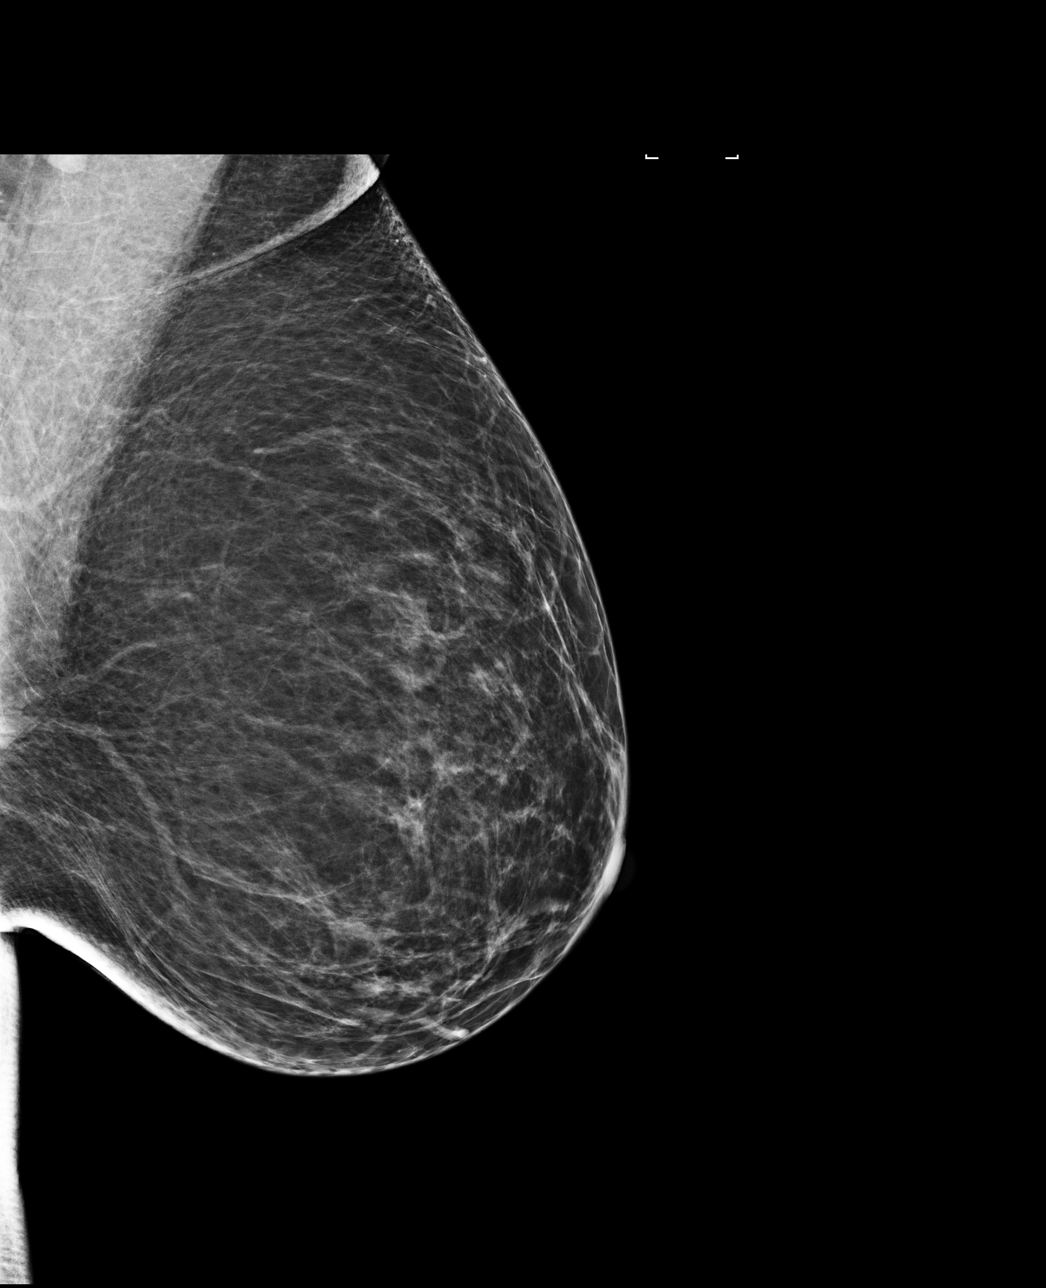

[4 of 4 positions shown; findings below may reference images not displayed]

ACR Breast Density Category b: There are scattered areas of
fibroglandular density.
FINDINGS: There are no findings suspicious for malignancy. Images were
processed with CAD.
IMPRESSION: No mammographic evidence of malignancy. A result letter of this
screening mammogram will be mailed directly to the patient.

RECOMMENDATION:
Screening mammogram in one year. (Code:[US])

BI-RADS CATEGORY  1: Negative.

## 2018-09-11 ENCOUNTER — Other Ambulatory Visit: Payer: Self-pay | Admitting: Nurse Practitioner

## 2018-09-13 ENCOUNTER — Ambulatory Visit (INDEPENDENT_AMBULATORY_CARE_PROVIDER_SITE_OTHER): Payer: 59 | Admitting: Nurse Practitioner

## 2018-09-13 ENCOUNTER — Encounter: Payer: Self-pay | Admitting: Nurse Practitioner

## 2018-09-13 VITALS — BP 116/74 | HR 95 | Temp 98.2°F | Ht 65.5 in | Wt 185.0 lb

## 2018-09-13 DIAGNOSIS — Z6830 Body mass index (BMI) 30.0-30.9, adult: Secondary | ICD-10-CM

## 2018-09-13 DIAGNOSIS — F419 Anxiety disorder, unspecified: Secondary | ICD-10-CM

## 2018-09-13 DIAGNOSIS — E6609 Other obesity due to excess calories: Secondary | ICD-10-CM

## 2018-09-13 MED ORDER — CITALOPRAM HYDROBROMIDE 20 MG PO TABS: 20.0000 mg | ORAL_TABLET | Freq: Every day | ORAL | 1 refills | Status: DC

## 2018-09-13 NOTE — Progress Notes (Signed)
Subjective:     Patient ID: Virginia Douglas , female    DOB: 1975-10-09 , 43 y.o.   MRN: [id number]   Chief Complaint  Patient presents with  . Obesity    HPI  Weight Loss - she is here for weight loss - taking phentermine 37.5 daily.  Joined the gym last week to go in the morning.  She is eating green foods, minimal fried foods and avoiding bread.  Drinking approximately 4 bottles (20 oz) day.  Current weight loss goal is 175 lbs before May  Earlier this week she had vomiting and diarrhea without fever.  She is doing better now     Past Medical History:  Diagnosis Date  . Anemia    iron deficiency  . Diabetes ([place])   . Edema   . Migraine headache      Family History  Problem Relation Age of Onset  . Diabetes Mother   . Hypertension Mother   . Diabetes Father   . Hypertension Father   . Kidney failure Father   . Hypertension Brother   . Diabetes Maternal Grandmother   . Breast cancer Maternal Grandmother   . Diabetes Paternal Grandmother   . Breast cancer Paternal Grandmother   . Cancer Maternal Aunt        breast  . Breast cancer Maternal Aunt      Current Outpatient Medications:  .  acetaZOLAMIDE (DIAMOX) 250 MG tablet, TAKE 1 TABLET BY MOUTH EVERY DAY, Disp: 90 tablet, Rfl: 0 .  acyclovir (ZOVIRAX) 400 MG tablet, Take 400 mg by mouth every 8 (eight) hours., Disp: , Rfl:  .  AIMOVIG 70 MG/ML SOAJ, INJECT 70 MG INTO THE SKIN EVERY 30 (THIRTY) DAYS., Disp: 1 pen, Rfl: 11 .  ALPRAZolam (XANAX) 0.5 MG tablet, TAKE 1 TABLET BY MOUTH TWICE A DAY AS NEEDED, Disp: 30 tablet, Rfl: 0 .  atorvastatin (LIPITOR) 10 MG tablet, Take 1 tablet (10 mg total) by mouth daily., Disp: 90 tablet, Rfl: 1 .  Blood Glucose Monitoring Suppl (ONETOUCH VERIO IQ SYSTEM) w/Device KIT, by Does not apply route. Use to check blood sugars twice, Disp: , Rfl:  .  citalopram (CELEXA) 20 MG tablet, Take 20 mg by mouth at bedtime., Disp: , Rfl: 1 .  glucose blood (ONETOUCH VERIO) test strip, 1 each  by Other route as needed for other. Insert 1 by subcutaneous route 2 times every day check blood sugar before breakfast and dinner, Disp: , Rfl:  .  lisinopril-hydrochlorothiazide (PRINZIDE,ZESTORETIC) 10-12.5 MG tablet, Take 1 tablet by mouth daily., Disp: 90 tablet, Rfl: 1 .  metFORMIN (GLUCOPHAGE-XR) 500 MG 24 hr tablet, Take 2 tablets (1,000 mg total) by mouth 2 (two) times daily., Disp: 30 tablet, Rfl: 0 .  methocarbamol (ROBAXIN) 500 MG tablet, TAKE 1 TABLET BY MOUTH 2 TIMES EVERY DAY AS NEEDED FOR MUSCLE STRAIN/SPASM, Disp: 30 tablet, Rfl: 0 .  ondansetron (ZOFRAN ODT) 4 MG disintegrating tablet, Take 1 tablet (4 mg total) by mouth every 8 (eight) hours as needed for nausea or vomiting., Disp: 20 tablet, Rfl: 2 .  phentermine (ADIPEX-P) 37.5 MG tablet, TAKE 1 TABLET BY MOUTH EVERY DAY BEFORE BREAKFAST, Disp: 30 tablet, Rfl: 0 .  phentermine (ADIPEX-P) 37.5 MG tablet, Take 1 tablet (37.5 mg total) by mouth every morning., Disp: 30 tablet, Rfl: 1 .  rizatriptan (MAXALT-MLT) 10 MG disintegrating tablet, Take 1 tablet earliest onset of migraine.  May repeat x1 in 2 hours if needed.  Maximum 2 tablets  in 24h, Disp: 9 tablet, Rfl: 3 .  topiramate (TOPAMAX) 50 MG tablet, TAKE 1 TABLET BY MOUTH EVERYDAY AT BEDTIME, Disp: 90 tablet, Rfl: 1 .  OZEMPIC 1 MG/DOSE SOPN, Inject 1 mg into the skin every Sunday. , Disp: , Rfl: 1   No Known Allergies   Review of Systems  Constitutional: Negative for fatigue.  Respiratory: Negative.   Cardiovascular: Negative.  Negative for chest pain, palpitations and leg swelling.  Endocrine: Negative for polydipsia, polyphagia and polyuria.  Musculoskeletal: Negative.   Neurological: Negative.  Negative for dizziness and headaches.     Today's Vitals   09/13/18 0924  BP: 116/74  Pulse: 95  Temp: 98.2 F (36.8 C)  TempSrc: Oral  Weight: 185 lb (83.9 kg)  Height: 5' 5.5" (1.664 m)   Body mass index is 30.32 kg/m.   Objective:  Physical Exam Constitutional:       Appearance: Normal appearance.  Cardiovascular:     Rate and Rhythm: Normal rate and regular rhythm.     Pulses: Normal pulses.     Heart sounds: Normal heart sounds. No murmur.  Pulmonary:     Effort: Pulmonary effort is normal. No respiratory distress.     Breath sounds: Normal breath sounds.  Neurological:     General: No focal deficit present.     Mental Status: She is alert and oriented to person, place, and time.          Assessment And Plan:     1. Obesity due to excess calories without serious comorbidity, unspecified classification  Chronic  Discussed healthy diet and regular exercise options   Encouraged to exercise at least 150 minutes per week with 2 days of strength training  Continue with phentermine 37.39m  Return in 2 months for weight check.  2. Anxiety  Chronic, controlled  Continue with current medications -citalopram  Using alprazolam minimally     JMinette Brine FNP

## 2018-09-24 ENCOUNTER — Other Ambulatory Visit: Payer: Self-pay | Admitting: Nurse Practitioner

## 2018-09-24 NOTE — Telephone Encounter (Signed)
Phentermine refill. 

## 2018-09-25 MED ORDER — PHENTERMINE HCL 37.5 MG PO TABS: 37.5000 mg | ORAL_TABLET | Freq: Every day | ORAL | 1 refills | Status: DC

## 2018-09-29 ENCOUNTER — Encounter: Payer: Self-pay | Admitting: Nurse Practitioner

## 2018-10-08 ENCOUNTER — Telehealth: Payer: Self-pay

## 2018-10-08 ENCOUNTER — Other Ambulatory Visit: Payer: Self-pay

## 2018-10-08 DIAGNOSIS — E6609 Other obesity due to excess calories: Secondary | ICD-10-CM

## 2018-10-08 MED ORDER — PHENTERMINE HCL 37.5 MG PO TABS: 37.5000 mg | ORAL_TABLET | Freq: Every day | ORAL | 1 refills | Status: DC

## 2018-10-08 NOTE — Telephone Encounter (Signed)
pt and pharmacy notified of approval of ozempic

## 2018-10-10 DIAGNOSIS — G932 Benign intracranial hypertension: Secondary | ICD-10-CM | POA: Diagnosis not present

## 2018-11-05 ENCOUNTER — Encounter: Payer: Self-pay | Admitting: Neurology

## 2018-11-05 ENCOUNTER — Ambulatory Visit (INDEPENDENT_AMBULATORY_CARE_PROVIDER_SITE_OTHER): Payer: 59 | Admitting: Neurology

## 2018-11-05 VITALS — BP 100/60 | HR 55 | Ht 65.5 in | Wt 183.0 lb

## 2018-11-05 DIAGNOSIS — G932 Benign intracranial hypertension: Secondary | ICD-10-CM

## 2018-11-05 DIAGNOSIS — M5416 Radiculopathy, lumbar region: Secondary | ICD-10-CM

## 2018-11-05 DIAGNOSIS — G43709 Chronic migraine without aura, not intractable, without status migrainosus: Secondary | ICD-10-CM

## 2018-11-05 MED ORDER — ERENUMAB-AOOE 140 MG/ML ~~LOC~~ SOAJ: 140.0000 mg | SUBCUTANEOUS | 11 refills | Status: DC

## 2018-11-05 NOTE — Patient Instructions (Addendum)
1.  For preventative management, we will increase Aimovig to 140mg  monthly.  Continue topiramate 100mg  at bedtime.  2.  For abortive therapy, we will try Reyvow.  Take once for migraine attack.  No more than 1 pill in 24 hours. 3.  Continue acetazolamide 250mg  daily.  Will obtain notes from your eye doctor and I will contact you if we will change dose. 4. We will order MRI of lumbar spine 5.  Limit use of pain relievers to no more than 2 days out of week to prevent risk of rebound or medication-overuse headache. 6.  Keep headache diary 7.  Exercise, hydration, caffeine cessation, sleep hygiene, monitor for and avoid triggers 8.  Consider:  magnesium citrate 400mg  daily, riboflavin 400mg  daily, and coenzyme Q10 100mg  three times daily 9.  Follow up in 4 months  We have sent a referral to [facility] for your MRI and they will call you directly to schedule your appt. They are located at [place]. If you need to contact them directly please call [phone].

## 2018-11-05 NOTE — Progress Notes (Addendum)
NEUROLOGY FOLLOW UP OFFICE NOTE  Virginia Douglas [id number]  HISTORY OF PRESENT ILLNESS: Virginia Douglas is a 43 year old right-handed woman with diabetes and hypertension who follows up for migraines.  UPDATE: Headaches not improved. Intensity:  Severe Duration:  1 day.  One time, she had a two-week bout. Frequency:  Every other day Frequency of abortive medication: 2 days out of week Rescue protocol:   Rizatriptan 10 mg, Zofran 22m, Robaxin Current NSAIDS:  none Current analgesics:  none Current triptans:  Rizatriptan 10 mg Current ergotamine: None Current anti-emetic: Zofran 4 mg Current muscle relaxants: Robaxin Current anti-anxiolytic:  alprazolam Current sleep aide:  none Current Antihypertensive medications:  Lisinopril-HCTZ Current Antidepressant medications:  Citalopram 295mCurrent Anticonvulsant medications: Topiramate 100 mg at bedtime, acetazolamide 25012maily  Current anti-CGRP:  Aimovig 57m6mrrent Vitamins/Herbal/Supplements:  none Current Antihistamines/Decongestants:  none Other therapy:  none Hormone/birth control:  no    She reports left sided low back pain radiating down the side of the left leg with associated numbness in the side of leg and foot.  She had a lumbar X-ray which demonstrated mild dextroscoliosis apex L4.  She has had it for several weeks.  No injury.  Gabapentin previously used was ineffective for neuropathic pain.  She reports idiopathic intracranial hypertension diagnosed in 2019.  In addition to topiramate she is on Diamox 250mg52mly.  Caffeine: One soda a week Diet: Hydrates Exercise: Yes Depression: Yes; Anxiety: Yes Other pain: No Sleep hygiene: Varies  HISTORY:  Onset: Remote history of migraines.  Controlled for many years.  Returned in May 2019.. LocMarland Kitchention:Right sided head/ear radiating down right side of neck. Does not radiate down the right arm. Quality:Pounding in head, burning in neck Initial  intensity:Severe.Shedenies thunderclap headache or severe headache that wakes herfrom sleep. Aura:no Prodrome:no Postdrome:no Associated symptoms: Nausea, vomiting, photophobia, phonophobia, blurred vision. She denies associated unilateral numbness or weakness. Initial duration:1 day Initial Frequency:2 days a week Initial Frequency of abortive medication:2 days a week Triggers: Emotional stress Relieving factors: Heating pad, Robaxin Activity:Aggravates  She was evaluated in the ED on 02/10/18, where CTA of head and neck was performed and personally reviewed. It demonstrated empty sella but otherwise unremarkable for mass lesion, aneurysm or dissection.  She is followed by ophthalmology.  Past NSAIDS:Ibuprofen, naproxen Past analgesics: tramadol 50mg 26m abortive triptans:Sumatriptan 100 mg Past muscle relaxants: Flexeril Past anti-emetic:no Past antihypertensive medications:no Past antidepressant medications: nortriptyline 50mg(c79md nausea/vomiting, problems sleeping) Past anticonvulsant medications: gabapentin 300mg tw19mdaily(for neuropathic pain), topiramate 25mg(ine94mtive) Past vitamins/Herbal/Supplements:no Past antihistamines/decongestants:no Other past therapies:no  Family history of headache:no  MRI of cervical spine from 09/22/15 was personally reviewed and was unremarkable.    PAST MEDICAL HISTORY: Past Medical History:  Diagnosis Date  . Anemia    iron deficiency  . Diabetes (HCC)   . Dickensma   . Migraine headache     MEDICATIONS: Current Outpatient Medications on File Prior to Visit  Medication Sig Dispense Refill  . acetaZOLAMIDE (DIAMOX) 250 MG tablet TAKE 1 TABLET BY MOUTH EVERY DAY 90 tablet 0  . acyclovir (ZOVIRAX) 400 MG tablet Take 400 mg by mouth every 8 (eight) hours.    . AIMOVIGMarland Kitchen70 MG/ML SOAJ INJECT 70 MG INTO THE SKIN EVERY 30 (THIRTY) DAYS. 1 pen 11  . ALPRAZolam (XANAX) 0.5 MG tablet TAKE 1  TABLET BY MOUTH TWICE A DAY AS NEEDED 30 tablet 0  . atorvastatin (LIPITOR) 10 MG tablet Take 1 tablet (10 mg total) by mouth daily. 90 tablet 1  .  Blood Glucose Monitoring Suppl (ONETOUCH VERIO IQ SYSTEM) w/Device KIT by Does not apply route. Use to check blood sugars twice    . citalopram (CELEXA) 20 MG tablet Take 1 tablet (20 mg total) by mouth at bedtime. 90 tablet 1  . glucose blood (ONETOUCH VERIO) test strip 1 each by Other route as needed for other. Insert 1 by subcutaneous route 2 times every day check blood sugar before breakfast and dinner    . lisinopril-hydrochlorothiazide (PRINZIDE,ZESTORETIC) 10-12.5 MG tablet Take 1 tablet by mouth daily. 90 tablet 1  . metFORMIN (GLUCOPHAGE-XR) 500 MG 24 hr tablet Take 2 tablets (1,000 mg total) by mouth 2 (two) times daily. 30 tablet 0  . methocarbamol (ROBAXIN) 500 MG tablet TAKE 1 TABLET BY MOUTH 2 TIMES EVERY DAY AS NEEDED FOR MUSCLE STRAIN/SPASM 30 tablet 0  . ondansetron (ZOFRAN ODT) 4 MG disintegrating tablet Take 1 tablet (4 mg total) by mouth every 8 (eight) hours as needed for nausea or vomiting. 20 tablet 2  . OZEMPIC 1 MG/DOSE SOPN Inject 1 mg into the skin every Sunday.   1  . phentermine (ADIPEX-P) 37.5 MG tablet Take 1 tablet (37.5 mg total) by mouth daily. 30 tablet 1  . rizatriptan (MAXALT-MLT) 10 MG disintegrating tablet Take 1 tablet earliest onset of migraine.  May repeat x1 in 2 hours if needed.  Maximum 2 tablets in 24h 9 tablet 3   No current facility-administered medications on file prior to visit.     ALLERGIES: No Known Allergies  FAMILY HISTORY: Family History  Problem Relation Age of Onset  . Diabetes Mother   . Hypertension Mother   . Diabetes Father   . Hypertension Father   . Kidney failure Father   . Hypertension Brother   . Diabetes Maternal Grandmother   . Breast cancer Maternal Grandmother   . Diabetes Paternal Grandmother   . Breast cancer Paternal Grandmother   . Cancer Maternal Aunt         breast  . Breast cancer Maternal Aunt     SOCIAL HISTORY: Social History   Socioeconomic History  . Marital status: Legally Separated    Spouse name: Jeneen Rinks  . Number of children: 2  . Years of education: 25  . Highest education level: Some college, no degree  Occupational History  . 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[employer]  . Financial resource strain: Not on file  . Food insecurity:    Worry: Not on file    Inability: Not on file  . Transportation needs:    Medical: Not on file    Non-medical: Not on file  Tobacco Use  . Smoking status: Current Some Day Smoker    Packs/day: 0.50    Years: 25.00    Pack years: 12.50    Types: Cigarettes  . Smokeless tobacco: Never Used  Substance and Sexual Activity  . Alcohol use: No    Alcohol/week: 0.0 standard drinks  . Drug use: No  . Sexual activity: Yes    Birth control/protection: Surgical  Lifestyle  . Physical activity:    Days per week: Not on file    Minutes per session: Not on file  . Stress: Not on file  Relationships  . Social connections:    Talks on phone: Not on file    Gets together: Not on file    Attends religious service: Not on file    Active member of club or organization: Not on file  Attends meetings of clubs or organizations: Not on file    Relationship status: Not on file  . Intimate partner violence:    Fear of current or ex partner: Not on file    Emotionally abused: Not on file    Physically abused: Not on file    Forced sexual activity: Not on file  Other Topics Concern  . 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 She lives with her 2 children in a one story house. She drinks one large cup of coffee a day and an occasional tea or soda. She walks daily.        REVIEW OF SYSTEMS: Constitutional: No fevers, chills, or sweats, no generalized fatigue, change in appetite Eyes: No visual changes, double vision, eye pain Ear, nose and throat: No hearing loss, ear pain, nasal congestion, sore throat Cardiovascular: No chest pain, palpitations Respiratory:  No shortness of breath at rest or with exertion, wheezes GastrointestinaI: No nausea, vomiting, diarrhea, abdominal pain, fecal incontinence Genitourinary:  No dysuria, urinary retention or frequency Musculoskeletal:  Back pain Integumentary: No rash, pruritus, skin lesions Neurological: as above Psychiatric: No depression, insomnia, anxiety Endocrine: No palpitations, fatigue, diaphoresis, mood swings, change in appetite, change in weight, increased thirst Hematologic/Lymphatic:  No purpura, petechiae. Allergic/Immunologic: no itchy/runny eyes, nasal congestion, recent allergic reactions, rashes  PHYSICAL EXAM: Blood pressure 100/60, pulse (!) 55, height 5' 5.5" (1.664 m), weight 183 lb (83 kg), SpO2 99 %. General: No acute distress.  Patient appears well-groomed.  Head:  Normocephalic/atraumatic Eyes:  Fundi examined but not visualized Neck: supple, no paraspinal tenderness, full range of motion Heart:  Regular rate and rhythm Lungs:  Clear to auscultation bilaterally Back: left sided paraspinal tenderness Neurological Exam: alert and oriented to person, place, and time. Attention span and concentration intact, recent and remote memory intact, fund of knowledge intact.  Speech fluent and not dysarthric, language intact.  CN II-XII intact. Bulk and tone normal, muscle strength 5/5 throughout.  Sensation to light touch, temperature and vibration intact.  Deep tendon reflexes 2+ throughout, toes downgoing.  Finger to nose and heel to shin testing intact.  Gait antalgic. Romberg negative.  IMPRESSION: 1.  Chronic migraine without aura, without status migrainosus, not intractable 2.  Left  sided lumbar radiculopathy.  Failed gabapentin. 3.  History of idiopathic intracranial hypertension.  New diagnosis to me.  She is on small dose of Diamox in addition to topiramate  PLAN: 1.  For preventative management, increase Aimovig to 154m monthly.  Continue topiramate 1082mat bedtime 2.  For abortive therapy, try Reyvow.  Stop rizatriptan.  Use Zofran for nausea 3. Will obtain notes from eye doctor.  Continue Diamox 25025maily for now. 4.  Limit use of pain relievers to no more than 2 days out of week to prevent risk of rebound or medication-overuse headache. 5.  Keep headache diary 6.  Exercise, hydration, caffeine cessation, sleep hygiene, monitor for and avoid triggers 7. Will order MRI of lumbar spine 8.  Consider:  magnesium citrate 400m42mily, riboflavin 400mg66mly, and coenzyme Q10 100mg 48me times daily 9.  Follow up in 4 months.  Adam JMetta ClinesCC: JaneceMinette Brine

## 2018-11-08 ENCOUNTER — Encounter: Payer: Self-pay | Admitting: Nurse Practitioner

## 2018-11-08 ENCOUNTER — Ambulatory Visit (INDEPENDENT_AMBULATORY_CARE_PROVIDER_SITE_OTHER): Payer: 59 | Admitting: Nurse Practitioner

## 2018-11-08 VITALS — BP 110/68 | HR 92 | Ht 65.5 in | Wt 179.0 lb

## 2018-11-08 DIAGNOSIS — E663 Overweight: Secondary | ICD-10-CM | POA: Diagnosis not present

## 2018-11-08 DIAGNOSIS — M79605 Pain in left leg: Secondary | ICD-10-CM

## 2018-11-08 DIAGNOSIS — I10 Essential (primary) hypertension: Secondary | ICD-10-CM | POA: Diagnosis not present

## 2018-11-08 DIAGNOSIS — Z6829 Body mass index (BMI) 29.0-29.9, adult: Secondary | ICD-10-CM

## 2018-11-08 DIAGNOSIS — E119 Type 2 diabetes mellitus without complications: Secondary | ICD-10-CM | POA: Diagnosis not present

## 2018-11-08 MED ORDER — KETOROLAC TROMETHAMINE 30 MG/ML IJ SOLN
30.0000 mg | Freq: Once | INTRAMUSCULAR | Status: AC
  Administered 2018-11-08: 30 mg via INTRAMUSCULAR

## 2018-11-08 NOTE — Progress Notes (Signed)
Subjective:     Patient ID: Virginia Douglas , female    DOB: 1975-09-13 , 43 y.o.   MRN: [id number]   Chief Complaint  Patient presents with  . Diabetes    dm check, discuss colonscopy     HPI  Obesity - she is doing well with phentermine  Diabetes  She presents for her follow-up diabetic visit. She has type 2 (prediabetes) diabetes mellitus. Pertinent negatives for hypoglycemia include no dizziness or headaches. Pertinent negatives for diabetes include no chest pain, no fatigue, no polydipsia, no polyphagia and no polyuria. Symptoms are stable. There are no diabetic complications. Risk factors for coronary artery disease include sedentary lifestyle. Current diabetic treatment includes oral agent (monotherapy) and oral agent (dual therapy). She is following a generally healthy diet. When asked about meal planning, she reported none. She has not had a previous visit with a dietitian. Exercise: two days a week. (Average blood sugars are 99) An ACE inhibitor/angiotensin II receptor blocker is being taken. Eye exam is current (last seen in January 2020).     Past Medical History:  Diagnosis Date  . Anemia    iron deficiency  . Diabetes ([place])   . Edema   . Migraine headache      Family History  Problem Relation Age of Onset  . Diabetes Mother   . Hypertension Mother   . Diabetes Father   . Hypertension Father   . Kidney failure Father   . Hypertension Brother   . Diabetes Maternal Grandmother   . Breast cancer Maternal Grandmother   . Diabetes Paternal Grandmother   . Breast cancer Paternal Grandmother   . Cancer Maternal Aunt        breast  . Breast cancer Maternal Aunt      Current Outpatient Medications:  .  acetaZOLAMIDE (DIAMOX) 250 MG tablet, TAKE 1 TABLET BY MOUTH EVERY DAY, Disp: 90 tablet, Rfl: 0 .  acyclovir (ZOVIRAX) 400 MG tablet, Take 400 mg by mouth every 8 (eight) hours., Disp: , Rfl:  .  AIMOVIG 70 MG/ML SOAJ, INJECT 70 MG INTO THE SKIN EVERY 30 (THIRTY)  DAYS., Disp: 1 pen, Rfl: 11 .  ALPRAZolam (XANAX) 0.5 MG tablet, TAKE 1 TABLET BY MOUTH TWICE A DAY AS NEEDED, Disp: 30 tablet, Rfl: 0 .  atorvastatin (LIPITOR) 10 MG tablet, Take 1 tablet (10 mg total) by mouth daily., Disp: 90 tablet, Rfl: 1 .  Blood Glucose Monitoring Suppl (ONETOUCH VERIO IQ SYSTEM) w/Device KIT, by Does not apply route. Use to check blood sugars twice, Disp: , Rfl:  .  citalopram (CELEXA) 20 MG tablet, Take 1 tablet (20 mg total) by mouth at bedtime., Disp: 90 tablet, Rfl: 1 .  Erenumab-aooe (AIMOVIG) 140 MG/ML SOAJ, Inject 140 mg into the skin every 30 (thirty) days., Disp: 1 pen, Rfl: 11 .  glucose blood (ONETOUCH VERIO) test strip, 1 each by Other route as needed for other. Insert 1 by subcutaneous route 2 times every day check blood sugar before breakfast and dinner, Disp: , Rfl:  .  lisinopril-hydrochlorothiazide (PRINZIDE,ZESTORETIC) 10-12.5 MG tablet, Take 1 tablet by mouth daily., Disp: 90 tablet, Rfl: 1 .  methocarbamol (ROBAXIN) 500 MG tablet, TAKE 1 TABLET BY MOUTH 2 TIMES EVERY DAY AS NEEDED FOR MUSCLE STRAIN/SPASM, Disp: 30 tablet, Rfl: 0 .  ondansetron (ZOFRAN ODT) 4 MG disintegrating tablet, Take 1 tablet (4 mg total) by mouth every 8 (eight) hours as needed for nausea or vomiting., Disp: 20 tablet, Rfl: 2 .  OZEMPIC 1 MG/DOSE SOPN, Inject 1 mg into the skin every Sunday. , Disp: , Rfl: 1 .  phentermine (ADIPEX-P) 37.5 MG tablet, Take 1 tablet (37.5 mg total) by mouth daily., Disp: 30 tablet, Rfl: 1 .  metFORMIN (GLUCOPHAGE-XR) 500 MG 24 hr tablet, Take 2 tablets (1,000 mg total) by mouth 2 (two) times daily. (Patient not taking: Reported on 11/08/2018), Disp: 30 tablet, Rfl: 0   No Known Allergies   Review of Systems  Constitutional: Negative for fatigue.  Respiratory: Negative.  Negative for cough.   Cardiovascular: Negative.  Negative for chest pain, palpitations and leg swelling.  Endocrine: Negative for polydipsia, polyphagia and polyuria.  Genitourinary:  Negative.   Neurological: Negative for dizziness and headaches.     Today's Vitals   11/08/18 1007  BP: 110/68  Pulse: 92  SpO2: 95%  Weight: 179 lb (81.2 kg)  Height: 5' 5.5" (1.664 m)   Body mass index is 29.33 kg/m.   Objective:  Physical Exam Vitals signs reviewed.  Constitutional:      Appearance: Normal appearance.  Cardiovascular:     Rate and Rhythm: Normal rate and regular rhythm.     Pulses: Normal pulses.     Heart sounds: Normal heart sounds. No murmur.  Neurological:     Mental Status: She is alert.         Assessment And Plan:     1. Type 2 diabetes mellitus without complication, without long-term current use of insulin (HCC)  Chronic, controlled  Continue with current medications  Encouraged to limit intake of sugary foods and drinks  Encouraged to continue physical activity to 150 minutes per week - Hemoglobin A1C - CMP14 + Anion Gap - Lipid Profile  2. Essential hypertension . B/P is controlled.  . CMP ordered to check renal function.  . The importance of regular exercise and dietary modification was stressed to the patient.   3. Overweight (BMI 25.0-29.9)  Her weight continues to improve now in overweight range  I will change to Qysmia due to being on phentermine for more than 6 months  4. Left leg pain  Aching pain to left leg with radiation to hip   Will treat with toradol - ketorolac (TORADOL) 30 MG/ML injection 30 mg   [name], FNP

## 2018-11-09 LAB — CMP14 + ANION GAP
ALT: 19 IU/L (ref 0–32)
AST: 23 IU/L (ref 0–40)
Albumin/Globulin Ratio: 2 (ref 1.2–2.2)
Albumin: 5 g/dL — ABNORMAL HIGH (ref 3.8–4.8)
Alkaline Phosphatase: 112 IU/L (ref 39–117)
Anion Gap: 20 mmol/L — ABNORMAL HIGH (ref 10.0–18.0)
BUN/Creatinine Ratio: 11 (ref 9–23)
BUN: 7 mg/dL (ref 6–24)
Bilirubin Total: 0.4 mg/dL (ref 0.0–1.2)
CO2: 16 mmol/L — ABNORMAL LOW (ref 20–29)
Calcium: 10 mg/dL (ref 8.7–10.2)
Chloride: 105 mmol/L (ref 96–106)
Creatinine, Ser: 0.64 mg/dL (ref 0.57–1.00)
GFR calc Af Amer: 128 mL/min/{1.73_m2} (ref >59)
GFR calc non Af Amer: 111 mL/min/{1.73_m2} (ref >59)
Globulin, Total: 2.5 g/dL (ref 1.5–4.5)
Glucose: 76 mg/dL (ref 65–99)
Potassium: 4 mmol/L (ref 3.5–5.2)
Sodium: 141 mmol/L (ref 134–144)
Total Protein: 7.5 g/dL (ref 6.0–8.5)

## 2018-11-09 LAB — HEMOGLOBIN A1C
Est. average glucose Bld gHb Est-mCnc: 120 mg/dL
Hgb A1c MFr Bld: 5.8 % — ABNORMAL HIGH (ref 4.8–5.6)

## 2018-11-09 LAB — LIPID PANEL
Chol/HDL Ratio: 2.3 ratio (ref 0.0–4.4)
Cholesterol, Total: 111 mg/dL (ref 100–199)
HDL: 49 mg/dL (ref >39)
LDL Calculated: 52 mg/dL (ref 0–99)
Triglycerides: 50 mg/dL (ref 0–149)
VLDL Cholesterol Cal: 10 mg/dL (ref 5–40)

## 2018-11-11 ENCOUNTER — Encounter: Payer: Self-pay | Admitting: Nurse Practitioner

## 2018-11-11 ENCOUNTER — Other Ambulatory Visit: Payer: Self-pay

## 2018-11-11 MED ORDER — PEN NEEDLES 32G X 4 MM MISC: 1.0000 | 3 refills | Status: DC

## 2018-11-13 ENCOUNTER — Other Ambulatory Visit: Payer: Self-pay | Admitting: Nurse Practitioner

## 2018-11-13 ENCOUNTER — Telehealth: Payer: Self-pay | Admitting: Neurology

## 2018-11-13 NOTE — Telephone Encounter (Signed)
Reviewed notes from ophthalmologist dated 10/10/18.  Exam revealed "maybe slight nasal nerve elevation" but no definite signs of papilledema.  Will continue small dose of Diamox for now.

## 2018-11-18 ENCOUNTER — Other Ambulatory Visit: Payer: Self-pay

## 2018-11-21 ENCOUNTER — Other Ambulatory Visit: Payer: Self-pay | Admitting: Nurse Practitioner

## 2018-11-21 DIAGNOSIS — E663 Overweight: Secondary | ICD-10-CM

## 2018-11-21 MED ORDER — PHENTERMINE-TOPIRAMATE ER 7.5-46 MG PO CP24: 1.0000 | ORAL_CAPSULE | Freq: Every day | ORAL | 1 refills | Status: DC

## 2018-11-26 ENCOUNTER — Encounter: Payer: Self-pay | Admitting: Nurse Practitioner

## 2018-11-27 ENCOUNTER — Ambulatory Visit: Payer: Self-pay | Admitting: Neurology

## 2018-11-29 ENCOUNTER — Other Ambulatory Visit: Payer: Self-pay | Admitting: Neurology

## 2018-12-06 ENCOUNTER — Telehealth: Payer: Self-pay

## 2018-12-06 NOTE — Telephone Encounter (Signed)
PA STARTED FOR QSYMIA- COVERMYMEDS

## 2018-12-08 ENCOUNTER — Other Ambulatory Visit: Payer: Self-pay | Admitting: Nurse Practitioner

## 2018-12-10 ENCOUNTER — Other Ambulatory Visit: Payer: Self-pay | Admitting: Neurology

## 2018-12-11 ENCOUNTER — Other Ambulatory Visit: Payer: Self-pay | Admitting: Neurology

## 2018-12-11 MED ORDER — PREGABALIN 50 MG PO CAPS: 50.0000 mg | ORAL_CAPSULE | Freq: Three times a day (TID) | ORAL | 2 refills | Status: DC

## 2018-12-11 NOTE — Progress Notes (Signed)
For lumbosacral radicular pain, prescribe Lyrica 50mg  three times daily.

## 2018-12-12 ENCOUNTER — Encounter: Payer: Self-pay | Admitting: Nurse Practitioner

## 2018-12-14 ENCOUNTER — Other Ambulatory Visit: Payer: Self-pay | Admitting: Nurse Practitioner

## 2018-12-14 DIAGNOSIS — I10 Essential (primary) hypertension: Secondary | ICD-10-CM

## 2018-12-14 DIAGNOSIS — E119 Type 2 diabetes mellitus without complications: Secondary | ICD-10-CM

## 2018-12-16 ENCOUNTER — Telehealth: Payer: Self-pay

## 2018-12-16 NOTE — Telephone Encounter (Signed)
Pharmacy and patient notified of approval of qsymia

## 2018-12-20 DIAGNOSIS — E876 Hypokalemia: Secondary | ICD-10-CM | POA: Diagnosis not present

## 2018-12-20 DIAGNOSIS — M79605 Pain in left leg: Secondary | ICD-10-CM | POA: Diagnosis not present

## 2018-12-20 DIAGNOSIS — L03116 Cellulitis of left lower limb: Secondary | ICD-10-CM | POA: Diagnosis not present

## 2018-12-20 DIAGNOSIS — M5442 Lumbago with sciatica, left side: Secondary | ICD-10-CM | POA: Diagnosis not present

## 2019-01-06 ENCOUNTER — Telehealth: Payer: Self-pay | Admitting: Neurology

## 2019-01-06 ENCOUNTER — Other Ambulatory Visit: Payer: Self-pay | Admitting: Nurse Practitioner

## 2019-01-06 NOTE — Telephone Encounter (Signed)
Pt left message with after hour service  01-03-19 @ 5:29 pm   Caller states she was speaking to irma she said to call to take over the phone for her FMLA papers

## 2019-01-06 NOTE — Telephone Encounter (Signed)
Rcvd FMLA paperwork and MyChart message.

## 2019-01-15 ENCOUNTER — Other Ambulatory Visit: Payer: Self-pay

## 2019-01-15 ENCOUNTER — Telehealth: Payer: Self-pay | Admitting: Neurology

## 2019-01-15 MED ORDER — PREGABALIN 100 MG PO CAPS: 100.0000 mg | ORAL_CAPSULE | Freq: Three times a day (TID) | ORAL | 6 refills | Status: DC

## 2019-01-15 NOTE — Telephone Encounter (Signed)
Spoke with Pt, faxed papers

## 2019-01-15 NOTE — Telephone Encounter (Signed)
Called in increase of Lyrica to [facility] per Dr Tomi Likens 100mg  TID, spoke with Ankit, #[phone]

## 2019-01-15 NOTE — Telephone Encounter (Signed)
Patient called regarding needing to speak with you about her FMLA papers. Please call. Thanks

## 2019-01-16 ENCOUNTER — Telehealth: Payer: Self-pay | Admitting: Neurology

## 2019-01-16 NOTE — Telephone Encounter (Signed)
Patient left VM for Sandi about FMLA paperwork. Please call her back at [phone]. Thanks!

## 2019-01-16 NOTE — Telephone Encounter (Signed)
Called LMOVM for Pt to return my call

## 2019-01-16 NOTE — Telephone Encounter (Signed)
Spoke with Pt. She asked I refax the FMLA paperwork to [phone].  Confirmed we rcvd the reasonable accomodation questionnaire, and that it will be signed when Dr. Tomi Likens returns on Tuesday 01/21/19.

## 2019-01-16 NOTE — Telephone Encounter (Signed)
Patient called to speak with you. Please Call. Thanks

## 2019-02-04 ENCOUNTER — Other Ambulatory Visit: Payer: Self-pay

## 2019-02-04 ENCOUNTER — Ambulatory Visit (INDEPENDENT_AMBULATORY_CARE_PROVIDER_SITE_OTHER): Payer: 59 | Admitting: Nurse Practitioner

## 2019-02-04 ENCOUNTER — Encounter: Payer: Self-pay | Admitting: Nurse Practitioner

## 2019-02-04 VITALS — BP 118/70 | HR 90 | Temp 98.1°F | Ht 64.8 in | Wt 179.6 lb

## 2019-02-04 DIAGNOSIS — I10 Essential (primary) hypertension: Secondary | ICD-10-CM | POA: Diagnosis not present

## 2019-02-04 DIAGNOSIS — E119 Type 2 diabetes mellitus without complications: Secondary | ICD-10-CM

## 2019-02-04 DIAGNOSIS — E6609 Other obesity due to excess calories: Secondary | ICD-10-CM

## 2019-02-04 DIAGNOSIS — F419 Anxiety disorder, unspecified: Secondary | ICD-10-CM

## 2019-02-04 MED ORDER — PHENTERMINE-TOPIRAMATE ER 11.25-69 MG PO CP24: 1.0000 | ORAL_CAPSULE | Freq: Every day | ORAL | 1 refills | Status: DC

## 2019-02-04 NOTE — Progress Notes (Signed)
Subjective:     Patient ID: Virginia Douglas , female    DOB: February 13, 1976 , 43 y.o.   MRN: [id number]   Chief Complaint  Patient presents with  . Diabetes    HPI  Weight check - she feels her weight has increased since being under quarantine.  She continues with Qsymia but does not feel is effective however she is on a low dose  Wt Readings from Last 3 Encounters: 02/04/19 : 179 lb 9.6 oz (81.5 kg) 11/08/18 : 179 lb (81.2 kg) 11/05/18 : 183 lb (83 kg)  Diabetes  She presents for her follow-up diabetic visit. She has type 2 diabetes mellitus. Her disease course has been stable. There are no hypoglycemic associated symptoms. Pertinent negatives for hypoglycemia include no confusion, dizziness or nervousness/anxiousness. There are no diabetic associated symptoms. Pertinent negatives for diabetes include no chest pain, no fatigue, no polydipsia, no polyphagia and no polyuria. There are no hypoglycemic complications. Symptoms are stable. There are no diabetic complications. Risk factors for coronary artery disease include hypertension, diabetes mellitus, sedentary lifestyle and obesity. She is compliant with treatment all of the time. When asked about meal planning, she reported none. She has not had a previous visit with a dietitian. She rarely participates in exercise. There is no change in her home blood glucose trend. (Not checking her blood sugar) An ACE inhibitor/angiotensin II receptor blocker is being taken. Eye exam is not current.     Past Medical History:  Diagnosis Date  . Anemia    iron deficiency  . Diabetes ([place])   . Edema   . Migraine headache      Family History  Problem Relation Age of Onset  . Diabetes Mother   . Hypertension Mother   . Diabetes Father   . Hypertension Father   . Kidney failure Father   . Hypertension Brother   . Diabetes Maternal Grandmother   . Breast cancer Maternal Grandmother   . Diabetes Paternal Grandmother   . Breast cancer Paternal  Grandmother   . Cancer Maternal Aunt        breast  . Breast cancer Maternal Aunt      Current Outpatient Medications:  .  acetaZOLAMIDE (DIAMOX) 250 MG tablet, TAKE 1 TABLET BY MOUTH EVERY DAY, Disp: 90 tablet, Rfl: 0 .  ALPRAZolam (XANAX) 0.5 MG tablet, TAKE 1 TABLET BY MOUTH TWICE A DAY AS NEEDED, Disp: 30 tablet, Rfl: 0 .  atorvastatin (LIPITOR) 10 MG tablet, TAKE 1 TABLET BY MOUTH EVERY DAY, Disp: 90 tablet, Rfl: 0 .  Blood Glucose Monitoring Suppl (ONETOUCH VERIO IQ SYSTEM) w/Device KIT, by Does not apply route. Use to check blood sugars twice, Disp: , Rfl:  .  citalopram (CELEXA) 20 MG tablet, Take 1 tablet (20 mg total) by mouth at bedtime., Disp: 90 tablet, Rfl: 1 .  Erenumab-aooe (AIMOVIG) 140 MG/ML SOAJ, Inject 140 mg into the skin every 30 (thirty) days., Disp: 1 pen, Rfl: 11 .  glucose blood (ONETOUCH VERIO) test strip, 1 each by Other route as needed for other. Insert 1 by subcutaneous route 2 times every day check blood sugar before breakfast and dinner, Disp: , Rfl:  .  Insulin Pen Needle (PEN NEEDLES) 32G X 4 MM MISC, 1 each by Does not apply route once a week., Disp: 30 each, Rfl: 3 .  lisinopril-hydrochlorothiazide (PRINZIDE,ZESTORETIC) 10-12.5 MG tablet, TAKE 1 TABLET BY MOUTH EVERY DAY, Disp: 90 tablet, Rfl: 0 .  metFORMIN (GLUCOPHAGE-XR) 500 MG 24 hr tablet,  Take 2 tablets (1,000 mg total) by mouth 2 (two) times daily., Disp: 30 tablet, Rfl: 0 .  methocarbamol (ROBAXIN) 500 MG tablet, TAKE 1 TABLET BY MOUTH 2 TIMES EVERY DAY AS NEEDED FOR MUSCLE STRAIN/SPASM, Disp: 30 tablet, Rfl: 0 .  ondansetron (ZOFRAN ODT) 4 MG disintegrating tablet, Take 1 tablet (4 mg total) by mouth every 8 (eight) hours as needed for nausea or vomiting., Disp: 20 tablet, Rfl: 2 .  OZEMPIC, 1 MG/DOSE, 2 MG/1.5ML SOPN, INJECT 1 MG SUBCUTANEOUSLY EVERY WEEK ON THE SAME DAY IN ABDOMEN, THIGH, OR UPPER ARM (ROTATE), Disp: 9 pen, Rfl: 3 .  Phentermine-Topiramate (QSYMIA) 7.5-46 MG CP24, Take 1 tablet by  mouth daily., Disp: 30 capsule, Rfl: 1 .  pregabalin (LYRICA) 100 MG capsule, Take 1 capsule (100 mg total) by mouth 3 (three) times daily., Disp: 90 capsule, Rfl: 6 .  topiramate (TOPAMAX) 50 MG tablet, Take 2 tablets (100 mg total) by mouth at bedtime. (Patient not taking: Reported on 02/04/2019), Disp: 30 tablet, Rfl: 3   No Known Allergies   Review of Systems  Constitutional: Negative.  Negative for fatigue.  Respiratory: Negative.   Cardiovascular: Negative.  Negative for chest pain.  Gastrointestinal: Negative.   Endocrine: Negative.  Negative for polydipsia, polyphagia and polyuria.  Skin: Negative.   Neurological: Negative.  Negative for dizziness.  Psychiatric/Behavioral: Negative for confusion. The patient is not nervous/anxious.      Today's Vitals   02/04/19 1024  BP: 118/70  Pulse: 90  Temp: 98.1 F (36.7 C)  TempSrc: Oral  Weight: 179 lb 9.6 oz (81.5 kg)  Height: 5' 4.8" (1.646 m)  PainSc: 4   PainLoc: Back   Body mass index is 30.07 kg/m.   Objective:  Physical Exam Vitals signs reviewed.  Constitutional:      Appearance: She is well-developed.  HENT:     Head: Normocephalic and atraumatic.  Eyes:     Pupils: Pupils are equal, round, and reactive to light.  Cardiovascular:     Rate and Rhythm: Normal rate and regular rhythm.     Pulses: Normal pulses.     Heart sounds: Normal heart sounds. No murmur.  Pulmonary:     Effort: Pulmonary effort is normal.     Breath sounds: Normal breath sounds.  Musculoskeletal: Normal range of motion.  Skin:    General: Skin is warm and dry.     Capillary Refill: Capillary refill takes less than 2 seconds.  Neurological:     General: No focal deficit present.     Mental Status: She is alert and oriented to person, place, and time.     Cranial Nerves: No cranial nerve deficit.  Psychiatric:        Mood and Affect: Mood normal.        Behavior: Behavior normal.        Thought Content: Thought content normal.         Judgment: Judgment normal.         Assessment And Plan:     1. Type 2 diabetes mellitus without complication, without long-term current use of insulin (HCC)  Chronic, controlled  Continue with current medications to include Ozempic  Encouraged to limit intake of sugary foods and drinks  Encouraged to increase physical activity to 150 minutes per week  2. Essential hypertension  Chronic, stable and great control  Continue with current medications  3. Anxiety  She feels more anxiety recently with the current pandemic  Will give her two  days off to help with her anxiety   4. Obesity due to excess calories without serious comorbidity, unspecified classification  Chronic  Weight is stable  Will increase phentermine to 42m  Encouraged to increase physical activity by walking     JMinette Brine FNP    THE PATIENT IS ENCOURAGED TO PRACTICE SOCIAL DISTANCING DUE TO THE COVID-19 PANDEMIC.

## 2019-02-04 NOTE — Patient Instructions (Addendum)
 -   [facility] Helpline [phone]) - Myfitnesspal for exercise routine and consider yoga

## 2019-02-05 LAB — HEMOGLOBIN A1C
Est. average glucose Bld gHb Est-mCnc: 114 mg/dL
Hgb A1c MFr Bld: 5.6 % (ref 4.8–5.6)

## 2019-02-05 LAB — CBC
Hematocrit: 38.9 % (ref 34.0–46.6)
Hemoglobin: 13.9 g/dL (ref 11.1–15.9)
MCH: 31 pg (ref 26.6–33.0)
MCHC: 35.7 g/dL (ref 31.5–35.7)
MCV: 87 fL (ref 79–97)
Platelets: 283 10*3/uL (ref 150–450)
RBC: 4.48 x10E6/uL (ref 3.77–5.28)
RDW: 13.8 % (ref 11.7–15.4)
WBC: 11.3 10*3/uL — ABNORMAL HIGH (ref 3.4–10.8)

## 2019-02-14 ENCOUNTER — Ambulatory Visit: Payer: Self-pay | Admitting: Nurse Practitioner

## 2019-03-05 ENCOUNTER — Other Ambulatory Visit: Payer: Self-pay | Admitting: Nurse Practitioner

## 2019-03-05 ENCOUNTER — Other Ambulatory Visit: Payer: Self-pay | Admitting: Neurology

## 2019-03-05 DIAGNOSIS — E119 Type 2 diabetes mellitus without complications: Secondary | ICD-10-CM

## 2019-03-05 DIAGNOSIS — I10 Essential (primary) hypertension: Secondary | ICD-10-CM

## 2019-03-06 NOTE — Telephone Encounter (Signed)
Can you please refill patient's medication

## 2019-03-10 NOTE — Progress Notes (Signed)
NEUROLOGY FOLLOW UP OFFICE NOTE  Virginia Douglas [id number]  HISTORY OF PRESENT ILLNESS: Virginia Douglas is a 43 year old right-handed woman with diabetes and hypertension who follows up for migraines and lumbar radiculopathy.  UPDATE: Increased Aimovig to 187m.  Started Reyvow.  Intensity:  severe Duration:  Within 30 minutes Frequency:  2 to 3 days a month Frequency of abortive medication:2 days out of week Rescue protocol: Reyvow, Zofran 464m Robaxin Current NSAIDS:none Current analgesics:none Other current abortive:  Reyvow Current triptans: none Current ergotamine:None Current anti-emetic:Zofran 4 mg Current muscle relaxants:Robaxin Current anti-anxiolytic:alprazolam Current sleep aide:none Current Antihypertensive medications:Lisinopril-HCTZ Current Antidepressant medications:Citalopram 2042murrent Anticonvulsant medications:Topiramate 100 mg at bedtime, acetazolamide 250m83mily, Lyrica 100mg1mee times daily. Current anti-CGRP:Aimovig 140mg 37ment Vitamins/Herbal/Supplements:none Current Antihistamines/Decongestants:none Other therapy:none Hormone/birth control:no   For lumbar radiculopathy, she was started on Lyrica.  It has been ineffective.  MRI of lumbar spine ordered but insurance denied it.  She has since changed insurance.    Caffeine:One soda a week Diet:Hydrates Exercise:Yes Depression:Yes; Anxiety:Yes Other pain:No Sleep hygiene:Varies  HISTORY: Onset: Remote history of migraines.  Controlled for many years.  Returned in May 2019.. LocaMarland Kitchenion:Right sided head/ear radiating down right side of neck. Does not radiate down the right arm. Quality:Pounding in head, burning in neck Initial intensity:Severe.Shedenies thunderclap headache or severe headache that wakes herfrom sleep. Aura:no Prodrome:no Postdrome:no Associated symptoms:  Nausea, vomiting, photophobia, phonophobia, blurred  vision. She denies associated unilateral numbness or weakness. Initial duration:1 day InitialFrequency:2 days a week InitialFrequency of abortive medication:2 days a week Triggers:  Emotional stress Relieving factors:  Heating pad, Robaxin Activity:Aggravates  She was evaluated in the ED on 02/10/18, where CTA of head and neck was performed and personally reviewed. It demonstrated empty sella but otherwise unremarkable for mass lesion, aneurysm or dissection.  She was diagnosed with iidiopathic intracranial hypertension diagnosed in 2019.  She is on Diamox 250mg d44m.  She is followed by ophthalmology.  Exam note from 10/10/18 stated "maybe slight nasal nerve elevation" but no definite signs of papilledema.  She reports left sided low back pain radiating down the side of the left leg with associated numbness in the side of leg and foot since early 2020  She had a lumbar X-ray which demonstrated mild dextroscoliosis apex L4.  No injury.  Gabapentin previously used was ineffective for neuropathic pain.   Past NSAIDS:Ibuprofen, naproxen Past analgesics: tramadol 50mg Pa56mbortive triptans:Sumatriptan 100 mg, rizatriptan 10mg Pas24mscle relaxants: Flexeril Past anti-emetic:no Past antihypertensive medications:no Past antidepressant medications: nortriptyline 50mg(caus76mausea/vomiting, problems sleeping) Past anticonvulsant medications: gabapentin 300mg twice54mly(for neuropathic pain), topiramate 25mg(ineffe38me) Past vitamins/Herbal/Supplements:no Past antihistamines/decongestants:no Other past therapies:no  Family history of headache:no  MRI of cervical spine from 09/22/15 was personally reviewed and was unremarkable.    PAST MEDICAL HISTORY: Past Medical History:  Diagnosis Date  . Anemia    iron deficiency  . Diabetes (HCC)   . EdeBrenas  . Migraine headache     MEDICATIONS: Current Outpatient Medications on File Prior to Visit  Medication  Sig Dispense Refill  . acetaZOLAMIDE (DIAMOX) 250 MG tablet TAKE 1 TABLET BY MOUTH EVERY DAY 90 tablet 0  . ALPRAZolam (XANAX) 0.5 MG tablet TAKE 1 TABLET BY MOUTH TWICE A DAY AS NEEDED 30 tablet 1  . atorvastatin (LIPITOR) 10 MG tablet TAKE 1 TABLET BY MOUTH EVERY DAY 90 tablet 0  . Blood Glucose Monitoring Suppl (ONETOUCH VERIO IQ SYSTEM) w/Device KIT by Does not apply route. Use to check blood sugars twice    .  citalopram (CELEXA) 20 MG tablet Take 1 tablet (20 mg total) by mouth at bedtime. 90 tablet 1  . Erenumab-aooe (AIMOVIG) 140 MG/ML SOAJ Inject 140 mg into the skin every 30 (thirty) days. 1 pen 11  . glucose blood (ONETOUCH VERIO) test strip 1 each by Other route as needed for other. Insert 1 by subcutaneous route 2 times every day check blood sugar before breakfast and dinner    . Insulin Pen Needle (PEN NEEDLES) 32G X 4 MM MISC 1 each by Does not apply route once a week. 30 each 3  . lisinopril-hydrochlorothiazide (ZESTORETIC) 10-12.5 MG tablet TAKE 1 TABLET BY MOUTH EVERY DAY 90 tablet 0  . metFORMIN (GLUCOPHAGE-XR) 500 MG 24 hr tablet Take 2 tablets (1,000 mg total) by mouth 2 (two) times daily. 30 tablet 0  . methocarbamol (ROBAXIN) 500 MG tablet TAKE 1 TABLET BY MOUTH 2 TIMES EVERY DAY AS NEEDED FOR MUSCLE STRAIN/SPASM 30 tablet 0  . ondansetron (ZOFRAN ODT) 4 MG disintegrating tablet Take 1 tablet (4 mg total) by mouth every 8 (eight) hours as needed for nausea or vomiting. 20 tablet 2  . OZEMPIC, 1 MG/DOSE, 2 MG/1.5ML SOPN INJECT 1 MG SUBCUTANEOUSLY EVERY WEEK ON THE SAME DAY IN ABDOMEN, THIGH, OR UPPER ARM (ROTATE) 9 pen 3  . Phentermine-Topiramate 11.25-69 MG CP24 Take 1 tablet by mouth daily. 30 capsule 1  . pregabalin (LYRICA) 100 MG capsule Take 1 capsule (100 mg total) by mouth 3 (three) times daily. 90 capsule 6   No current facility-administered medications on file prior to visit.     ALLERGIES: No Known Allergies  FAMILY HISTORY: Family History  Problem Relation  Age of Onset  . Diabetes Mother   . Hypertension Mother   . Diabetes Father   . Hypertension Father   . Kidney failure Father   . Hypertension Brother   . Diabetes Maternal Grandmother   . Breast cancer Maternal Grandmother   . Diabetes Paternal Grandmother   . Breast cancer Paternal Grandmother   . Cancer Maternal Aunt        breast  . Breast cancer Maternal Aunt      SOCIAL HISTORY: Social History   Socioeconomic History  . Marital status: Legally Separated    Spouse name: Jeneen Rinks  . Number of children: 2  . Years of education: 49  . Highest education level: Some college, no degree  Occupational History  . 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[employer]  . Financial resource strain: Not on file  . Food insecurity    Worry: Not on file    Inability: Not on file  . Transportation needs    Medical: Not on file    Non-medical: Not on file  Tobacco Use  . Smoking status: Current Some Day Smoker    Packs/day: 0.50    Years: 25.00    Pack years: 12.50    Types: Cigarettes  . Smokeless tobacco: Never Used  Substance and Sexual Activity  . Alcohol use: No    Alcohol/week: 0.0 standard drinks  . Drug use: No  . Sexual activity: Yes    Birth control/protection: Surgical  Lifestyle  . Physical activity    Days per week: Not on file    Minutes per session: Not on file  . Stress: Not on file  Relationships  . Social Herbalist on phone: Not on file    Gets together: Not on file  Attends religious service: Not on file    Active member of club or organization: Not on file    Attends meetings of clubs or organizations: Not on file    Relationship status: Not on file  . Intimate partner violence    Fear of current or ex partner: Not on file    Emotionally abused: Not on file    Physically abused: Not on file    Forced sexual activity: Not on file  Other Topics Concern  . 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 She lives with her 2 children in a one story house. She drinks one large cup of coffee a day and an occasional tea or soda. She walks daily.       REVIEW OF SYSTEMS: Constitutional: No fevers, chills, or sweats, no generalized fatigue, change in appetite Eyes: No visual changes, double vision, eye pain Ear, nose and throat: No hearing loss, ear pain, nasal congestion, sore throat Cardiovascular: No chest pain, palpitations Respiratory:  No shortness of breath at rest or with exertion, wheezes GastrointestinaI: No nausea, vomiting, diarrhea, abdominal pain, fecal incontinence Genitourinary:  No dysuria, urinary retention or frequency Musculoskeletal:  No neck pain, back pain Integumentary: No rash, pruritus, skin lesions Neurological: as above Psychiatric: No depression, insomnia, anxiety Endocrine: No palpitations, fatigue, diaphoresis, mood swings, change in appetite, change in weight, increased thirst Hematologic/Lymphatic:  No purpura, petechiae. Allergic/Immunologic: no itchy/runny eyes, nasal congestion, recent allergic reactions, rashes  PHYSICAL EXAM: Blood pressure 113/71, pulse 86, temperature 98.6 F (37 C), temperature source Oral, height 5' 6.5" (1.689 m), weight 187 lb (84.8 kg), SpO2 99 %. General: No acute distress.  Patient appears well-groomed.   Head:  Normocephalic/atraumatic Eyes:  Fundi examined but not visualized Neck: supple, no paraspinal tenderness, full range of motion Heart:  Regular rate and rhythm Lungs:  Clear to auscultation bilaterally Back: No paraspinal tenderness Neurological Exam: alert and oriented to person, place, and time. Attention span and concentration intact, recent and remote memory intact, fund of knowledge intact.  Speech fluent and not  dysarthric, language intact.  CN II-XII intact. Bulk and tone normal, muscle strength 5/5 throughout.  Sensation to light touch, temperature and vibration intact.  Deep tendon reflexes 2+ throughout, toes downgoing.  Finger to nose and heel to shin testing intact.  Gait normal, Romberg negative.  IMPRESSION: 1.  Miigraine without aura, without status migrainosus, not intractable 2.  Left sided lumbar radiculopathy 3.  History of idiopathic intracranial abnormality.  No papilledema noted on ophthalmologic exam in February  PLAN: 1.  For preventative management, Aimovig 157m monthly 2.  For abortive therapy, will get prior authorization for Reyvow 3.  Lyrica 1047mthree times daily for sciatic pain.  Refer to physical therapy.  If still not improved by next visit, will get MRI of lumbar spine. 4.  Limit use of pain relievers to no more than 2 days out of week to prevent risk of rebound or medication-overuse headache. 5. She has repeat eye exam in September.  If still stable, consider discontinuing acetazolamide. 6.  Keep headache diary 7.  Exercise, hydration, caffeine cessation, sleep hygiene, monitor for and avoid triggers 8.  Consider:  magnesium citrate 4003maily, riboflavin 400m45mily, and coenzyme Q10 100mg16mee times daily  9. Always keep in mind that currently taking a hormone or birth control may be a possible trigger or aggravating factor for migraine. 10. Follow up 4 months.  Metta Clines, DO  CC: [name], FNP

## 2019-03-12 ENCOUNTER — Encounter: Payer: Self-pay | Admitting: Neurology

## 2019-03-12 ENCOUNTER — Other Ambulatory Visit: Payer: Self-pay

## 2019-03-12 ENCOUNTER — Ambulatory Visit (INDEPENDENT_AMBULATORY_CARE_PROVIDER_SITE_OTHER): Payer: 59 | Admitting: Neurology

## 2019-03-12 VITALS — BP 113/71 | HR 86 | Temp 98.6°F | Ht 66.5 in | Wt 187.0 lb

## 2019-03-12 DIAGNOSIS — G43009 Migraine without aura, not intractable, without status migrainosus: Secondary | ICD-10-CM | POA: Diagnosis not present

## 2019-03-12 DIAGNOSIS — G932 Benign intracranial hypertension: Secondary | ICD-10-CM

## 2019-03-12 DIAGNOSIS — M5416 Radiculopathy, lumbar region: Secondary | ICD-10-CM

## 2019-03-12 MED ORDER — REYVOW 100 MG PO TABS: 1.0000 | ORAL_TABLET | Freq: Every day | ORAL | 3 refills | Status: DC | PRN

## 2019-03-12 NOTE — Patient Instructions (Signed)
1.  Continue Aimovig, Lyrica 2.  We will get prior authorization for Reyvow, the pill you took when you get a migraine 3.  Will refer you to physical therapy for lumbar radiculopathy 4.  Try to work on quitting smoking 5.  Follow up in 4 months.

## 2019-03-27 ENCOUNTER — Telehealth: Payer: Self-pay

## 2019-03-27 NOTE — Telephone Encounter (Signed)
Called pt with questions about qsymia

## 2019-04-02 ENCOUNTER — Telehealth: Payer: Self-pay

## 2019-04-02 NOTE — Telephone Encounter (Signed)
Pt informed to call her insurance to see what they will cover for weight loss

## 2019-04-02 NOTE — Telephone Encounter (Signed)
-----   Message from [name], [place] sent at 04/01/2019  6:01 PM EDT ----- She will need to call her insurance company to see what they cover.   ----- Message ----- From: Octavio Manns Sent: 03/28/2019   9:31 AM EDT To: [name], FNP  Pt wants to try something else for weight loss. I told her she would probably need another appt

## 2019-04-03 ENCOUNTER — Other Ambulatory Visit: Payer: Self-pay | Admitting: Nurse Practitioner

## 2019-04-07 ENCOUNTER — Ambulatory Visit (INDEPENDENT_AMBULATORY_CARE_PROVIDER_SITE_OTHER): Payer: 59 | Admitting: Nurse Practitioner

## 2019-04-07 ENCOUNTER — Encounter: Payer: Self-pay | Admitting: Nurse Practitioner

## 2019-04-07 ENCOUNTER — Other Ambulatory Visit: Payer: Self-pay

## 2019-04-07 VITALS — BP 114/80 | HR 74 | Temp 97.7°F | Ht 66.4 in | Wt 191.8 lb

## 2019-04-07 DIAGNOSIS — E119 Type 2 diabetes mellitus without complications: Secondary | ICD-10-CM | POA: Diagnosis not present

## 2019-04-07 DIAGNOSIS — F419 Anxiety disorder, unspecified: Secondary | ICD-10-CM

## 2019-04-07 DIAGNOSIS — F329 Major depressive disorder, single episode, unspecified: Secondary | ICD-10-CM | POA: Diagnosis not present

## 2019-04-07 DIAGNOSIS — E1169 Type 2 diabetes mellitus with other specified complication: Secondary | ICD-10-CM | POA: Insufficient documentation

## 2019-04-07 DIAGNOSIS — F32A Depression, unspecified: Secondary | ICD-10-CM

## 2019-04-07 DIAGNOSIS — M79605 Pain in left leg: Secondary | ICD-10-CM | POA: Insufficient documentation

## 2019-04-07 DIAGNOSIS — E6609 Other obesity due to excess calories: Secondary | ICD-10-CM

## 2019-04-07 HISTORY — DX: Pain in left leg: M79.605

## 2019-04-07 MED ORDER — CITALOPRAM HYDROBROMIDE 40 MG PO TABS: 20.0000 mg | ORAL_TABLET | Freq: Every day | ORAL | 1 refills | Status: DC

## 2019-04-07 NOTE — Patient Instructions (Signed)
   Increase your physical activity to 3 days a week (cardio)  Decrease your intake of sodas  Eat at least 4-5 small meals per day.

## 2019-04-07 NOTE — Progress Notes (Signed)
Subjective:     Patient ID: Virginia Douglas , female    DOB: Mar 13, 1976 , 43 y.o.   MRN: [id number]   Chief Complaint  Patient presents with  . Weight Check    HPI  Weight check -  She is currently out Qsymia for the last 2 weeks due to insurance issues.   She is walking on her break at work.  30 minutes per day doing isometric.  She only eats once a day.  She will drink a Dr. Malachi Bonds.    Wt Readings from Last 3 Encounters: 04/07/19 : 191 lb 12.8 oz (87 kg) 03/12/19 : 187 lb (84.8 kg) 02/04/19 : 179 lb 9.6 oz (81.5 kg)     Diabetes She presents for her follow-up diabetic visit. She has type 2 diabetes mellitus. Her disease course has been stable. There are no hypoglycemic associated symptoms. Pertinent negatives for hypoglycemia include no confusion, dizziness or nervousness/anxiousness. There are no diabetic associated symptoms. Pertinent negatives for diabetes include no chest pain, no fatigue, no polydipsia, no polyphagia and no polyuria. There are no hypoglycemic complications. Symptoms are stable. There are no diabetic complications. Risk factors for coronary artery disease include hypertension, diabetes mellitus, sedentary lifestyle and obesity. She is compliant with treatment all of the time. When asked about meal planning, she reported none. She has not had a previous visit with a dietitian. She rarely participates in exercise. There is no change in her home blood glucose trend. (Not checking her blood sugar) An ACE inhibitor/angiotensin II receptor blocker is being taken. Eye exam is not current.     Past Medical History:  Diagnosis Date  . Anemia    iron deficiency  . Diabetes ([place])   . Edema   . Migraine headache      Family History  Problem Relation Age of Onset  . Diabetes Mother   . Hypertension Mother   . Diabetes Father   . Hypertension Father   . Kidney failure Father   . Hypertension Brother   . Diabetes Maternal Grandmother   . Breast cancer Maternal  Grandmother   . Diabetes Paternal Grandmother   . Breast cancer Paternal Grandmother   . Cancer Maternal Aunt        breast  . Breast cancer Maternal Aunt      Current Outpatient Medications:  .  acetaZOLAMIDE (DIAMOX) 250 MG tablet, TAKE 1 TABLET BY MOUTH EVERY DAY, Disp: 90 tablet, Rfl: 0 .  ALPRAZolam (XANAX) 0.5 MG tablet, TAKE 1 TABLET BY MOUTH TWICE A DAY AS NEEDED, Disp: 30 tablet, Rfl: 1 .  atorvastatin (LIPITOR) 10 MG tablet, TAKE 1 TABLET BY MOUTH EVERY DAY, Disp: 90 tablet, Rfl: 0 .  Blood Glucose Monitoring Suppl (ONETOUCH VERIO IQ SYSTEM) w/Device KIT, by Does not apply route. Use to check blood sugars twice, Disp: , Rfl:  .  citalopram (CELEXA) 20 MG tablet, Take 1 tablet (20 mg total) by mouth at bedtime., Disp: 90 tablet, Rfl: 1 .  Erenumab-aooe (AIMOVIG) 140 MG/ML SOAJ, Inject 140 mg into the skin every 30 (thirty) days., Disp: 1 pen, Rfl: 11 .  glucose blood (ONETOUCH VERIO) test strip, 1 each by Other route as needed for other. Insert 1 by subcutaneous route 2 times every day check blood sugar before breakfast and dinner, Disp: , Rfl:  .  Insulin Pen Needle (PEN NEEDLES) 32G X 4 MM MISC, 1 each by Does not apply route once a week., Disp: 30 each, Rfl: 3 .  Lasmiditan  Succinate (REYVOW) 100 MG TABS, Take 1 tablet by mouth daily as needed., Disp: 16 tablet, Rfl: 3 .  lisinopril-hydrochlorothiazide (ZESTORETIC) 10-12.5 MG tablet, TAKE 1 TABLET BY MOUTH EVERY DAY, Disp: 90 tablet, Rfl: 0 .  metFORMIN (GLUCOPHAGE-XR) 500 MG 24 hr tablet, Take 2 tablets (1,000 mg total) by mouth 2 (two) times daily., Disp: 30 tablet, Rfl: 0 .  methocarbamol (ROBAXIN) 500 MG tablet, TAKE 1 TABLET BY MOUTH 2 TIMES EVERY DAY AS NEEDED FOR MUSCLE STRAIN/SPASM, Disp: 30 tablet, Rfl: 0 .  ondansetron (ZOFRAN ODT) 4 MG disintegrating tablet, Take 1 tablet (4 mg total) by mouth every 8 (eight) hours as needed for nausea or vomiting., Disp: 20 tablet, Rfl: 2 .  OZEMPIC, 1 MG/DOSE, 2 MG/1.5ML SOPN, INJECT 1  MG SUBCUTANEOUSLY EVERY WEEK ON THE SAME DAY IN ABDOMEN, THIGH, OR UPPER ARM (ROTATE), Disp: 9 pen, Rfl: 3 .  Phentermine-Topiramate 11.25-69 MG CP24, Take 1 tablet by mouth daily., Disp: 30 capsule, Rfl: 1 .  pregabalin (LYRICA) 100 MG capsule, Take 1 capsule (100 mg total) by mouth 3 (three) times daily., Disp: 90 capsule, Rfl: 6   No Known Allergies   Review of Systems  Constitutional: Negative.  Negative for fatigue.  Respiratory: Negative.  Negative for wheezing.   Cardiovascular: Negative.  Negative for chest pain.  Gastrointestinal: Negative.   Endocrine: Negative.  Negative for polydipsia, polyphagia and polyuria.  Musculoskeletal: Negative.        Left ankle pain.  She had a fall over a couple of months ago after getting out of the shower.    Skin: Negative.   Neurological: Negative.  Negative for dizziness, facial asymmetry and numbness.  Psychiatric/Behavioral: Negative for agitation and confusion. The patient is not nervous/anxious.      Today's Vitals   04/07/19 0949 04/07/19 0950  BP:  114/80  Pulse:  74  Temp:  97.7 F (36.5 C)  TempSrc:  Oral  Weight:  191 lb 12.8 oz (87 kg)  Height:  5' 6.4" (1.687 m)  PainSc: 0-No pain 0-No pain   Body mass index is 30.59 kg/m.   Objective:  Physical Exam Vitals signs reviewed.  Constitutional:      Appearance: She is well-developed.  HENT:     Head: Normocephalic and atraumatic.  Eyes:     Pupils: Pupils are equal, round, and reactive to light.  Cardiovascular:     Rate and Rhythm: Normal rate and regular rhythm.     Pulses: Normal pulses.     Heart sounds: Normal heart sounds. No murmur.  Pulmonary:     Effort: Pulmonary effort is normal.     Breath sounds: Normal breath sounds.  Musculoskeletal:        General: Swelling (trace swelling on left ankle) present.  Skin:    General: Skin is warm and dry.     Capillary Refill: Capillary refill takes less than 2 seconds.  Neurological:     General: No focal deficit  present.     Mental Status: She is alert and oriented to person, place, and time.     Cranial Nerves: No cranial nerve deficit.  Psychiatric:        Mood and Affect: Mood normal.        Behavior: Behavior normal.        Thought Content: Thought content normal.        Judgment: Judgment normal.         Assessment And Plan:     1. Type  2 diabetes mellitus without complication, without long-term current use of insulin (HCC)  Chronic, controlled  Continue with current medications  Encouraged to limit intake of sugary foods and drinks  Encouraged to increase physical activity to 150 minutes per week  2. Depression, unspecified depression type  She feels now she is ready to seek counseling has had multiple deaths and a recent divorce  Will refer to psychology  3. Obesity due to excess calories without serious comorbidity, unspecified classification  Chronic  Discussed healthy diet and regular exercise options   Encouraged to exercise at least 150 minutes per week with 2 days of strength training  She has had a significant weight gain and has not been taking her qsymia  4. Anxiety  Chronic  Will be referring for counseling - citalopram (CELEXA) 40 MG tablet; Take 0.5 tablets (20 mg total) by mouth at bedtime.  Dispense: 90 tablet; Refill: 1  5. Left leg pain  Continues to have left ankle pain after having a fall at home  Has had Venous doppler done which was normal.   Advised to continue to monitor if worse will advise to discuss with orthopedic   [name], FNP    THE PATIENT IS ENCOURAGED TO PRACTICE SOCIAL DISTANCING DUE TO THE COVID-19 PANDEMIC.

## 2019-04-26 ENCOUNTER — Other Ambulatory Visit: Payer: Self-pay

## 2019-04-26 ENCOUNTER — Encounter (HOSPITAL_COMMUNITY): Payer: Self-pay | Admitting: *Deleted

## 2019-04-26 ENCOUNTER — Emergency Department (HOSPITAL_COMMUNITY)
Admission: EM | Admit: 2019-04-26 | Discharge: 2019-04-26 | Disposition: A | Discharge status: Home / Self Care | Payer: 59 | Attending: Emergency Medicine | Admitting: Emergency Medicine

## 2019-04-26 DIAGNOSIS — Z87891 Personal history of nicotine dependence: Secondary | ICD-10-CM | POA: Insufficient documentation

## 2019-04-26 DIAGNOSIS — G43809 Other migraine, not intractable, without status migrainosus: Secondary | ICD-10-CM

## 2019-04-26 DIAGNOSIS — Z79899 Other long term (current) drug therapy: Secondary | ICD-10-CM | POA: Diagnosis not present

## 2019-04-26 DIAGNOSIS — E119 Type 2 diabetes mellitus without complications: Secondary | ICD-10-CM | POA: Diagnosis not present

## 2019-04-26 DIAGNOSIS — Z794 Long term (current) use of insulin: Secondary | ICD-10-CM | POA: Diagnosis not present

## 2019-04-26 DIAGNOSIS — R51 Headache: Secondary | ICD-10-CM | POA: Diagnosis present

## 2019-04-26 MED ORDER — KETOROLAC TROMETHAMINE 15 MG/ML IJ SOLN
15.0000 mg | Freq: Once | INTRAMUSCULAR | Status: AC
  Administered 2019-04-26: 15 mg via INTRAVENOUS
  Filled 2019-04-26: qty 1

## 2019-04-26 MED ORDER — KETOROLAC TROMETHAMINE 10 MG PO TABS: 10.0000 mg | ORAL_TABLET | Freq: Four times a day (QID) | ORAL | 0 refills | Status: DC | PRN

## 2019-04-26 MED ORDER — DIPHENHYDRAMINE HCL 50 MG/ML IJ SOLN
25.0000 mg | Freq: Once | INTRAMUSCULAR | Status: AC
  Administered 2019-04-26: 25 mg via INTRAVENOUS
  Filled 2019-04-26: qty 1

## 2019-04-26 MED ORDER — SODIUM CHLORIDE 0.9 % IV BOLUS
1000.0000 mL | Freq: Once | INTRAVENOUS | Status: AC
  Administered 2019-04-26: 1000 mL via INTRAVENOUS

## 2019-04-26 MED ORDER — PROCHLORPERAZINE EDISYLATE 10 MG/2ML IJ SOLN
5.0000 mg | Freq: Once | INTRAMUSCULAR | Status: AC
  Administered 2019-04-26: 5 mg via INTRAVENOUS
  Filled 2019-04-26: qty 2

## 2019-04-26 MED ORDER — ACETAMINOPHEN 500 MG PO TABS
1000.0000 mg | ORAL_TABLET | Freq: Once | ORAL | Status: AC
  Administered 2019-04-26: 1000 mg via ORAL
  Filled 2019-04-26: qty 2

## 2019-04-26 MED ORDER — HYDROMORPHONE HCL 1 MG/ML IJ SOLN
0.5000 mg | Freq: Once | INTRAMUSCULAR | Status: AC
  Administered 2019-04-26: 0.5 mg via INTRAVENOUS
  Filled 2019-04-26: qty 1

## 2019-04-26 NOTE — ED Provider Notes (Signed)
[facility] DEPT Provider Note   CSN: [id number] Arrival date & time: 04/26/19  1754     History   Chief Complaint Chief Complaint  Patient presents with  . Migraine    HPI Virginia Douglas is a 43 y.o. female.  Presents emergency room chief complaint headache.  Patient states has had many migraines and headaches previously and have been difficult to control.  Followed by neurology.  Patient is on regimen of Diamox, Aimovig, as needed rizatriptan.  States last night she had start of a migraine that has been constant throughout the day today.  Pain is currently 10-10 in severity throughout her entire head, denies neck pain or neck stiffness.  Took single dose of her triptan.  Headache throbbing in nature, worsened with bright lights, no alleviating factors.  States she does not like to have to take medicine for her headaches if possible.  No numbness, weakness.  Per chart review, followed closely by neurology for migraines.  Similar presentation in June 2019 and CTA head and neck were negative for acute pathology though radiologist commented on empty sella which could be suggestive of idiopathic intracranial hypertension.  Patient followed up with her neurologist and he felt this was less likely and that finding was likely incidental  (note on 03/19/2018).  Had been evaluated previously by ophthalmology and patient has not had papilledema.     HPI  Past Medical History:  Diagnosis Date  . Anemia    iron deficiency  . Diabetes ([place])   . Edema   . Migraine headache     Patient Active Problem List   Diagnosis Date Noted  . Type 2 diabetes mellitus without complication, without long-term current use of insulin ([place]) 04/07/2019  . Depression 04/07/2019  . Obesity due to excess calories without serious comorbidity 04/07/2019  . Left leg pain 04/07/2019  . Anxiety 04/07/2019  . Headache 07/06/2018  . Diabetes ([place]) 12/08/2015  . Essential hypertension  12/08/2015  . Smoker 12/08/2015  . LLQ pain 12/08/2015  . Paresthesia 09/16/2015  . Urinary urgency 09/16/2015    Past Surgical History:  Procedure Laterality Date  . ABDOMINAL HYSTERECTOMY    . CHOLECYSTECTOMY       OB History   No obstetric history on file.      Home Medications    Prior to Admission medications   Medication Sig Start Date End Date Taking? Authorizing Provider  acetaZOLAMIDE (DIAMOX) 250 MG tablet TAKE 1 TABLET BY MOUTH EVERY DAY 04/03/19   [name], FNP  ALPRAZolam Duanne Moron) 0.5 MG tablet TAKE 1 TABLET BY MOUTH TWICE A DAY AS NEEDED 03/06/19   [name], FNP  atorvastatin (LIPITOR) 10 MG tablet TAKE 1 TABLET BY MOUTH EVERY DAY 03/05/19   [name], FNP  Blood Glucose Monitoring Suppl (ONETOUCH VERIO IQ SYSTEM) w/Device KIT by Does not apply route. Use to check blood sugars twice    [provider]  citalopram (CELEXA) 40 MG tablet Take 0.5 tablets (20 mg total) by mouth at bedtime. 04/07/19   [name], FNP  Erenumab-aooe (AIMOVIG) 140 MG/ML SOAJ Inject 140 mg into the skin every 30 (thirty) days. 11/05/18   Tomi Likens, Adam R, DO  glucose blood (ONETOUCH VERIO) test strip 1 each by Other route as needed for other. Insert 1 by subcutaneous route 2 times every day check blood sugar before breakfast and dinner    [provider]  Insulin Pen Needle (PEN NEEDLES) 32G X 4 MM MISC 1 each  by Does not apply route once a week. 11/11/18   [name], FNP  Lasmiditan Succinate (REYVOW) 100 MG TABS Take 1 tablet by mouth daily as needed. 03/12/19   Tomi Likens, Adam R, DO  lisinopril-hydrochlorothiazide (ZESTORETIC) 10-12.5 MG tablet TAKE 1 TABLET BY MOUTH EVERY DAY 03/05/19   [name], FNP  metFORMIN (GLUCOPHAGE-XR) 500 MG 24 hr tablet Take 2 tablets (1,000 mg total) by mouth 2 (two) times daily. 03/10/16   Long, Wonda Olds, MD  methocarbamol (ROBAXIN) 500 MG tablet TAKE 1 TABLET BY MOUTH 2 TIMES EVERY DAY AS NEEDED FOR MUSCLE STRAIN/SPASM 08/20/18   [name], FNP  ondansetron (ZOFRAN ODT) 4 MG disintegrating tablet Take 1 tablet (4 mg total) by mouth every 8 (eight) hours as needed for nausea or vomiting. 03/19/18   Jaffe, Adam R, DO  OZEMPIC, 1 MG/DOSE, 2 MG/1.5ML SOPN INJECT 1 MG SUBCUTANEOUSLY EVERY WEEK ON THE SAME DAY IN ABDOMEN, THIGH, OR UPPER ARM (ROTATE) 01/06/19   [name], FNP  pregabalin (LYRICA) 100 MG capsule Take 1 capsule (100 mg total) by mouth 3 (three) times daily. 01/15/19   Tomi Likens, Adam R, DO  Phentermine-Topiramate 11.25-69 MG CP24 Take 1 tablet by mouth daily. 02/04/19 04/07/19  [name], FNP    Family History Family History  Problem Relation Age of Onset  . Diabetes Mother   . Hypertension Mother   . Diabetes Father   . Hypertension Father   . Kidney failure Father   . Hypertension Brother   . Diabetes Maternal Grandmother   . Breast cancer Maternal Grandmother   . Diabetes Paternal Grandmother   . Breast cancer Paternal Grandmother   . Cancer Maternal Aunt        breast  . Breast cancer Maternal Aunt     Social History Social History   Tobacco Use  . Smoking status: Former Smoker    Packs/day: 0.50    Years: 25.00    Pack years: 12.50    Types: Cigarettes    Start date: 03/24/2019  . Smokeless tobacco: Never Used  Substance Use Topics  . Alcohol use: No    Alcohol/week: 0.0 standard drinks  . Drug use: No     Allergies   Patient has no known allergies.   Review of Systems Review of Systems  Constitutional: Negative for chills and fever.  HENT: Negative for ear pain and sore throat.   Eyes: Negative for pain and visual disturbance.  Respiratory: Negative for cough and shortness of breath.   Cardiovascular: Negative for chest pain and palpitations.  Gastrointestinal: Negative for abdominal pain and vomiting.  Genitourinary: Negative for dysuria and hematuria.  Musculoskeletal: Negative for arthralgias and back pain.  Skin: Negative for color change and rash.  Neurological: Positive  for headaches. Negative for seizures and syncope.  All other systems reviewed and are negative.    Physical Exam Updated Vital Signs BP 122/76 (BP Location: Right Arm)   Pulse (!) 101   Temp 98.7 F (37.1 C) (Oral)   Resp 15   Ht 5' 6"  (1.676 m)   Wt 84.4 kg   SpO2 99%   BMI 30.02 kg/m   Physical Exam Vitals signs and nursing note reviewed.  Constitutional:      General: She is not in acute distress.    Appearance: She is well-developed.  HENT:     Head: Normocephalic and atraumatic.  Eyes:     Conjunctiva/sclera: Conjunctivae normal.  Neck:     Musculoskeletal: Neck supple.  Cardiovascular:     Rate and Rhythm: Normal rate and regular rhythm.     Heart sounds: No murmur.  Pulmonary:     Effort: Pulmonary effort is normal. No respiratory distress.     Breath sounds: Normal breath sounds.  Abdominal:     Palpations: Abdomen is soft.     Tenderness: There is no abdominal tenderness.  Skin:    General: Skin is warm and dry.  Neurological:     Mental Status: She is alert.     Comments: Alert and oriented x3, cranial nerves II through XII intact, 5 out of 5 strength in bilateral upper and lower extremities, sensation intact bilateral upper and lower extremities, normal finger-nose-finger      ED Treatments / Results  Labs (all labs ordered are listed, but only abnormal results are displayed) Labs Reviewed - No data to display  EKG None  Radiology No results found.  Procedures Procedures (including critical care time)  Medications Ordered in ED Medications - No data to display   Initial Impression / Assessment and Plan / ED Course  I have reviewed the triage vital signs and the nursing notes.  Pertinent labs & imaging results that were available during my care of the patient were reviewed by me and considered in my medical decision making (see chart for details).  Clinical Course as of Apr 25 2225  Sat Apr 26, 2019  2224 Recheck patient, symptoms have  now resolved, will discharge home, plan for close neurology follow-up as outpatient   [RD]  2224  reviewed return precautions   [RD]    Clinical Course User Index [RD] Lucrezia Starch, MD      43 year old lady known history severe migraines presenting with migraine similar to prior.  No focal neurologic deficits, not sudden onset, given similar to prior and negative past head imaging, do not feel emergent head imaging indicated at this time. Patient provided symptomatic treatment, significant improvement in symptoms, will discharge home with plan for close neurology follow-up.    After the discussed management above, the patient was determined to be safe for discharge.  The patient was in agreement with this plan and all questions regarding their care were answered.  ED return precautions were discussed and the patient will return to the ED with any significant worsening of condition.   Final Clinical Impressions(s) / ED Diagnoses   Final diagnoses:  Other migraine without status migrainosus, not intractable    ED Discharge Orders    None       Lucrezia Starch, MD 04/26/19 2226

## 2019-04-26 NOTE — ED Triage Notes (Signed)
Migraine developed last night, Home meds have not worked, neck and rt ear pain as well as N/V

## 2019-04-26 NOTE — Discharge Instructions (Addendum)
Please call your neurologist as well as your primary doctor for recheck next week.  If your headache is worsening, you develop any numbness, weakness, fever, neck pain or other new concerning symptoms recommend returning to the ER for reassessment.  Recommendation your previously prescribed medicines as directed for your migraines.  May also take newly prescribed Toradol as needed as prescribed.  Also add Tylenol as needed.

## 2019-04-29 NOTE — Progress Notes (Signed)
NEUROLOGY FOLLOW UP OFFICE NOTE  [name] [id number]  HISTORY OF PRESENT ILLNESS: Virginia Pruntyis a 43 year old right-handed woman with diabetes and hypertension who follows up for migraines and lumbar radiculopathy.  UPDATE: Seen in ED on 04/26/19 for migraine.  She developed an intractable migraine the previous day.  Took single dose of rizatriptan but did not repeat because she does not like to take medication if possible.  She did not have Reyvow because she didn't get a call from her pharmacy.  They recommended an LP but she deferred and wanted to talk with me first.  She got a headache but headache returned and she continues to have a pounding headache.    Intensity:  severe Duration:  Within 30 minutes Frequency:  2 to 3 days a month Frequency of abortive medication:2 days out of week Rescue protocol:Reyvow, Zofran 81m, Robaxin Current NSAIDS:none Current analgesics:none Other current abortive:  Reyvow Current triptans:none Current ergotamine:None Current anti-emetic:Zofran 4 mg Current muscle relaxants:Robaxin Current anti-anxiolytic:alprazolam Current sleep aide:none Current Antihypertensive medications:Lisinopril-HCTZ Current Antidepressant medications:Citalopram 270mCurrent Anticonvulsant medications:Topiramate 100 mg at bedtime, acetazolamide 25015maily, Lyrica 100m22mree times daily. Current anti-CGRP:Aimovig 140mg58mrent Vitamins/Herbal/Supplements:none Current Antihistamines/Decongestants:none Other therapy:none Hormone/birth control:no  For lumbar radiculopathy, she was started on Lyrica.  It has been ineffective.  MRI of lumbar spine ordered but insurance denied it.  She has since changed insurance.    Caffeine:One soda a week Diet:Hydrates Exercise:Yes Depression:Yes; Anxiety:Yes Other pain:No Sleep hygiene:Varies  HISTORY: Onset: Remote history of migraines. Controlled for many years.  Returned in May 2019.. LocMarland Kitchention:Right sided head/ear radiating down right side of neck. Does not radiate down the right arm. Quality:Pounding in head, burning in neck Initial intensity:Severe.Shedenies thunderclap headache or severe headache that wakes herfrom sleep. Aura:no Prodrome:no Postdrome:no Associated symptoms:  Nausea, vomiting, photophobia, phonophobia, blurred vision. She denies associated unilateral numbness or weakness. Initial duration:1 day InitialFrequency:2 days a week InitialFrequency of abortive medication:2 days a week Triggers:  Emotional stress Relieving factors:  Heating pad, Robaxin Activity:Aggravates  She was evaluated in the ED on 02/10/18, where CTA of head and neck was performed and personally reviewed. It demonstrated empty sella but otherwise unremarkable for mass lesion, aneurysm or dissection.  She was diagnosed with iidiopathic intracranial hypertension diagnosed in 2019.  She is on Diamox 250mg 5my.  She is followed by ophthalmology.  Exam note from 10/10/18 stated "maybe slight nasal nerve elevation" but no definite signs of papilledema.  She reports left sided low back pain radiating down the side of the left leg with associated numbness in the side of leg and foot since early 2020 She had a lumbar X-ray which demonstrated mild dextroscoliosis apex L4. No injury. Gabapentin previously used was ineffective for neuropathic pain.   Past NSAIDS:Ibuprofen, naproxen Past analgesics: tramadol 50mg P61mabortive triptans:Sumatriptan 100 mg, rizatriptan 10mg Pa53muscle relaxants: Flexeril Past anti-emetic:no Past antihypertensive medications:no Past antidepressant medications: nortriptyline 50mg(cau94mnausea/vomiting, problems sleeping) Past anticonvulsant medications: gabapentin 300mg twic54mily(for neuropathic pain), topiramate 25mg(ineff58mve) Past vitamins/Herbal/Supplements:no Past  antihistamines/decongestants:no Other past therapies:no  Family history of headache:no  MRI of cervical spine from 09/22/15 was personally reviewed and was unremarkable.  PAST MEDICAL HISTORY: Past Medical History:  Diagnosis Date  . Anemia    iron deficiency  . Diabetes ([facility])   . [place]   . Migraine headache     MEDICATIONS: Current Outpatient Medications on File Prior to Visit  Medication Sig Dispense Refill  . acetaZOLAMIDE (DIAMOX) 250 MG tablet TAKE 1 TABLET BY  MOUTH EVERY DAY 90 tablet 0  . ALPRAZolam (XANAX) 0.5 MG tablet TAKE 1 TABLET BY MOUTH TWICE A DAY AS NEEDED (Patient taking differently: Take 0.5 mg by mouth 2 (two) times daily as needed for anxiety. ) 30 tablet 1  . atorvastatin (LIPITOR) 10 MG tablet TAKE 1 TABLET BY MOUTH EVERY DAY 90 tablet 0  . Blood Glucose Monitoring Suppl (ONETOUCH VERIO IQ SYSTEM) w/Device KIT by Does not apply route. Use to check blood sugars twice    . citalopram (CELEXA) 40 MG tablet Take 0.5 tablets (20 mg total) by mouth at bedtime. 90 tablet 1  . Erenumab-aooe (AIMOVIG) 140 MG/ML SOAJ Inject 140 mg into the skin every 30 (thirty) days. 1 pen 11  . glucose blood (ONETOUCH VERIO) test strip 1 each by Other route as needed for other. Insert 1 by subcutaneous route 2 times every day check blood sugar before breakfast and dinner    . Insulin Pen Needle (PEN NEEDLES) 32G X 4 MM MISC 1 each by Does not apply route once a week. 30 each 3  . ketorolac (TORADOL) 10 MG tablet Take 1 tablet (10 mg total) by mouth every 6 (six) hours as needed for moderate pain or severe pain. 20 tablet 0  . Lasmiditan Succinate (REYVOW) 100 MG TABS Take 1 tablet by mouth daily as needed. (Patient taking differently: Take 1 tablet by mouth daily as needed (migraine). ) 16 tablet 3  . Lifitegrast (XIIDRA) 5 % SOLN Place 1 drop into both eyes daily.    Marland Kitchen lisinopril-hydrochlorothiazide (ZESTORETIC) 10-12.5 MG tablet TAKE 1 TABLET BY MOUTH EVERY DAY 90 tablet 0  .  metFORMIN (GLUCOPHAGE-XR) 500 MG 24 hr tablet Take 2 tablets (1,000 mg total) by mouth 2 (two) times daily. 30 tablet 0  . OZEMPIC, 1 MG/DOSE, 2 MG/1.5ML SOPN INJECT 1 MG SUBCUTANEOUSLY EVERY WEEK ON THE SAME DAY IN ABDOMEN, THIGH, OR UPPER ARM (ROTATE) 9 pen 3  . pregabalin (LYRICA) 100 MG capsule Take 1 capsule (100 mg total) by mouth 3 (three) times daily. 90 capsule 6  . rizatriptan (MAXALT-MLT) 10 MG disintegrating tablet Take 10 mg by mouth as needed for migraine. Take 1 tablet earliest onset of migraine.  May repeat x1 in 2 hours if needed.  Maximum 2 tablets in 24h    . TURMERIC PO Take 1 capsule by mouth daily.    . [DISCONTINUED] Phentermine-Topiramate 11.25-69 MG CP24 Take 1 tablet by mouth daily. 30 capsule 1   No current facility-administered medications on file prior to visit.     ALLERGIES: No Known Allergies  FAMILY HISTORY: Family History  Problem Relation Age of Onset  . Diabetes Mother   . Hypertension Mother   . Diabetes Father   . Hypertension Father   . Kidney failure Father   . Hypertension Brother   . Diabetes Maternal Grandmother   . Breast cancer Maternal Grandmother   . Diabetes Paternal Grandmother   . Breast cancer Paternal Grandmother   . Cancer Maternal Aunt        breast  . Breast cancer Maternal Aunt    SOCIAL HISTORY: Social History   Socioeconomic History  . Marital status: Legally Separated    Spouse name: Jeneen Rinks  . Number of children: 2  . Years of education: 51  . Highest education level: Some college, no degree  Occupational History  . 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[employer]  . Financial resource strain: Not on file  .  Food insecurity    Worry: Not on file    Inability: Not on file  . Transportation needs    Medical: Not on file    Non-medical: Not on file  Tobacco Use  . Smoking status: Former Smoker    Packs/day: 0.50    Years: 25.00    Pack years: 12.50    Types: Cigarettes     Start date: 03/24/2019  . Smokeless tobacco: Never Used  Substance and Sexual Activity  . Alcohol use: No    Alcohol/week: 0.0 standard drinks  . Drug use: No  . Sexual activity: Yes    Birth control/protection: Surgical  Lifestyle  . Physical activity    Days per week: Not on file    Minutes per session: Not on file  . Stress: Not on file  Relationships  . Social Herbalist on phone: Not on file    Gets together: Not on file    Attends religious service: Not on file    Active member of club or organization: Not on file    Attends meetings of clubs or organizations: Not on file    Relationship status: Not on file  . Intimate partner violence    Fear of current or ex partner: Not on file    Emotionally abused: Not on file    Physically abused: Not on file    Forced sexual activity: Not on file  Other Topics Concern  . 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 She lives with her 2 children in a one story house. She drinks one large cup of coffee a day and an occasional tea or soda. She walks daily.       REVIEW OF SYSTEMS: Constitutional: No fevers, chills, or sweats, no generalized fatigue, change in appetite Eyes: No visual changes, double vision, eye pain Ear, nose and throat: No hearing loss, ear pain, nasal congestion, sore throat Cardiovascular: No chest pain, palpitations Respiratory:  No shortness of breath at rest or with exertion, wheezes GastrointestinaI: No nausea, vomiting, diarrhea, abdominal pain, fecal incontinence Genitourinary:  No dysuria, urinary retention or frequency Musculoskeletal:  No neck pain, back pain Integumentary: No rash, pruritus, skin lesions Neurological: as above Psychiatric: No depression, insomnia, anxiety Endocrine: No  palpitations, fatigue, diaphoresis, mood swings, change in appetite, change in weight, increased thirst Hematologic/Lymphatic:  No purpura, petechiae. Allergic/Immunologic: no itchy/runny eyes, nasal congestion, recent allergic reactions, rashes  PHYSICAL EXAM: Blood pressure 113/72, pulse 99, temperature 98.4 F (36.9 C), height 5' 7"  (1.702 m), weight 202 lb 6.4 oz (91.8 kg), SpO2 99 %. General: No acute distress.  Patient appears well-groomed.   Head:  Normocephalic/atraumatic Eyes:  Fundi examined but not visualized Neck: supple, no paraspinal tenderness, full range of motion Heart:  Regular rate and rhythm Lungs:  Clear to auscultation bilaterally Back: No paraspinal tenderness Neurological Exam: alert and oriented to person, place, and time. Attention span and concentration intact, recent and remote memory intact, fund of knowledge intact.  Speech fluent and not dysarthric, language intact.  CN II-XII intact. Bulk and tone normal, muscle strength 5/5 throughout.  Sensation to light touch  intact.  Deep tendon reflexes 2+ throughout.  Finger to nose testing intact.  Gait normal  IMPRESSION: 1.  Migraine  without aura, with status migrainosus, intractable 2.  History of idiopathic intracranial hypertension.  No papilledema noted on ophthalmologic exam.  PLAN: 1.  Will give her a headache cocktail (she has a driver) 2.  Will increase acetazolamide to 252m twice daily and have her follow up with ophthalmology.  Continue topiramate 1043m 3.  For abortive therapy, Reyvow (she knows not to drive for 8 hours after taken) 4.  Limit use of pain relievers to no more than 2 days out of week to prevent risk of rebound or medication-overuse headache. 5.  Keep headache diary 6.  Exercise, hydration, caffeine cessation, sleep hygiene, monitor for and avoid triggers 7.  Consider:  magnesium citrate 40060maily, riboflavin 400m69mily, and coenzyme Q10 100mg29mee times daily 8. Always keep in mind  that currently taking a hormone or birth control may be a possible trigger or aggravating factor for migraine. 9. Follow up 4 months.  Adam Metta Clines

## 2019-04-30 ENCOUNTER — Encounter: Payer: Self-pay | Admitting: Neurology

## 2019-04-30 ENCOUNTER — Ambulatory Visit (INDEPENDENT_AMBULATORY_CARE_PROVIDER_SITE_OTHER): Payer: 59 | Admitting: Neurology

## 2019-04-30 ENCOUNTER — Other Ambulatory Visit: Payer: Self-pay

## 2019-04-30 VITALS — BP 113/72 | HR 99 | Temp 98.4°F | Ht 67.0 in | Wt 202.4 lb

## 2019-04-30 DIAGNOSIS — G43011 Migraine without aura, intractable, with status migrainosus: Secondary | ICD-10-CM | POA: Diagnosis not present

## 2019-04-30 DIAGNOSIS — G932 Benign intracranial hypertension: Secondary | ICD-10-CM | POA: Diagnosis not present

## 2019-04-30 LAB — HM DIABETES EYE EXAM

## 2019-04-30 MED ORDER — ACETAZOLAMIDE 250 MG PO TABS: 250.0000 mg | ORAL_TABLET | Freq: Two times a day (BID) | ORAL | 3 refills | Status: DC

## 2019-04-30 NOTE — Patient Instructions (Addendum)
1.  Increase acetazolamide to 250mg  twice daily. 2.  Advised to contact the eye doctor and have a repeat eye exam 3.  Will look into approval of the Reyvow 4.  Follow up 4 months

## 2019-05-01 MED ORDER — METOCLOPRAMIDE HCL 5 MG/ML IJ SOLN
10.0000 mg | Freq: Once | INTRAMUSCULAR | Status: AC
  Administered 2019-04-30: 10:00:00 10 mg via INTRAMUSCULAR

## 2019-05-01 MED ORDER — DIPHENHYDRAMINE HCL 50 MG/ML IJ SOLN
25.0000 mg | Freq: Once | INTRAMUSCULAR | Status: AC
  Administered 2019-04-30: 10:00:00 25 mg via INTRAMUSCULAR

## 2019-05-01 MED ORDER — KETOROLAC TROMETHAMINE 60 MG/2ML IM SOLN
60.0000 mg | Freq: Once | INTRAMUSCULAR | Status: AC
  Administered 2019-04-30: 10:00:00 60 mg via INTRAMUSCULAR

## 2019-05-01 NOTE — Progress Notes (Addendum)
Per MD order gave patient a "migraine cocktail" injection IM when in office 8/26 at 0941. This is charted in [facility] and consisted of 60mg  of toradol; 10 mg reglan; 25 mg of benadryl mixed together. This was 40ml combined volume and split into 28ml of the mixture given in patient's LUQ and 24ml in RUQ. Patient tolerated well and had a driver home.  This is a late entry into Epic - verbal order was given by MD during appt 8/26 and was given immediately at 0941. Charting was late and due to delay in charting was unable to enter order for when it was given therefore the later time/date for order.

## 2019-05-01 NOTE — Addendum Note (Signed)
Addended by: Jesse Fall on: 05/01/2019 01:40 PM   Modules accepted: Orders

## 2019-05-27 ENCOUNTER — Other Ambulatory Visit: Payer: Self-pay | Admitting: Nurse Practitioner

## 2019-05-27 ENCOUNTER — Encounter: Payer: Self-pay | Admitting: Nurse Practitioner

## 2019-05-27 DIAGNOSIS — F419 Anxiety disorder, unspecified: Secondary | ICD-10-CM

## 2019-05-27 MED ORDER — CLONAZEPAM 0.5 MG PO TABS: 0.5000 mg | ORAL_TABLET | Freq: Three times a day (TID) | ORAL | 1 refills | Status: DC | PRN

## 2019-05-28 ENCOUNTER — Telehealth: Payer: Self-pay | Admitting: *Deleted

## 2019-05-28 NOTE — Telephone Encounter (Addendum)
FMLA forms had previously been submitted for this patient and then she contacted office and said the company was unable to read everything clearly on the form and she needed it recopied. (Patient stated she already paid the $25 the first time).  Requested patient bring [place] the copy of the FMLA completed as well as a blank new one. Patient sent my chart message today asking if it is completed. Called patient today and it IS the one from May (which was pulled from media tab) that she needs rewritten.  This was done and signed by MD and faxed to number on form [facility] [phone].  Patient called and made aware of above and copy sent to her home address.

## 2019-05-30 ENCOUNTER — Other Ambulatory Visit: Payer: Self-pay | Admitting: Nurse Practitioner

## 2019-05-30 DIAGNOSIS — I10 Essential (primary) hypertension: Secondary | ICD-10-CM

## 2019-05-30 DIAGNOSIS — E119 Type 2 diabetes mellitus without complications: Secondary | ICD-10-CM

## 2019-06-04 ENCOUNTER — Encounter: Payer: Self-pay | Admitting: Nurse Practitioner

## 2019-06-05 ENCOUNTER — Other Ambulatory Visit: Payer: Self-pay

## 2019-06-05 ENCOUNTER — Telehealth: Payer: Self-pay | Admitting: Neurology

## 2019-06-05 ENCOUNTER — Encounter: Payer: Self-pay | Admitting: Nurse Practitioner

## 2019-06-05 ENCOUNTER — Ambulatory Visit (INDEPENDENT_AMBULATORY_CARE_PROVIDER_SITE_OTHER): Payer: 59 | Admitting: Nurse Practitioner

## 2019-06-05 VITALS — BP 110/60 | HR 96 | Temp 98.3°F | Ht 66.4 in | Wt 200.0 lb

## 2019-06-05 DIAGNOSIS — Z634 Disappearance and death of family member: Secondary | ICD-10-CM | POA: Diagnosis not present

## 2019-06-05 DIAGNOSIS — F4321 Adjustment disorder with depressed mood: Secondary | ICD-10-CM

## 2019-06-05 DIAGNOSIS — G43809 Other migraine, not intractable, without status migrainosus: Secondary | ICD-10-CM | POA: Diagnosis not present

## 2019-06-05 DIAGNOSIS — M25512 Pain in left shoulder: Secondary | ICD-10-CM | POA: Diagnosis not present

## 2019-06-05 DIAGNOSIS — G43909 Migraine, unspecified, not intractable, without status migrainosus: Secondary | ICD-10-CM | POA: Insufficient documentation

## 2019-06-05 HISTORY — DX: Pain in left shoulder: M25.512

## 2019-06-05 MED ORDER — KETOROLAC TROMETHAMINE 60 MG/2ML IM SOLN
60.0000 mg | Freq: Once | INTRAMUSCULAR | Status: AC
  Administered 2019-06-05: 60 mg via INTRAMUSCULAR

## 2019-06-05 NOTE — Progress Notes (Signed)
Subjective:     Patient ID: Virginia Douglas , female    DOB: [birth year] , 43 y.o.   MRN: [id number]   Chief Complaint  Patient presents with  . Migraine    patient would like to be taken out of work for a little bit    HPI  She is here today due to having severe migraines since September 13th when her mother was murdered by a family member.  She did return to work initially and has had a severe headache (migraine).  She is being followed by Dr. Rondel Baton for her migraines.  She has contacted her Employee Assistance Program for counseling in which they offer 6 visits.  She does report she has support at home by family.    Migraine  This is a recurrent problem. The current episode started 1 to 4 weeks ago. The problem occurs constantly. The problem has been gradually worsening. The pain is located in the left unilateral region. The pain does not radiate. The pain quality is not similar to prior headaches. The quality of the pain is described as aching. The patient is experiencing no pain. Associated symptoms include photophobia. Pertinent negatives include no coughing, dizziness, fever, numbness or tingling. The symptoms are aggravated by emotional stress. Her past medical history is significant for migraine headaches, obesity and pseudotumor cerebri.  Shoulder Pain  The pain is present in the left shoulder. This is a new problem. The current episode started yesterday. There has been no history of extremity trauma. The problem occurs intermittently. The problem has been gradually worsening. The quality of the pain is described as aching. The pain is at a severity of 9/10. The pain is severe. Pertinent negatives include no fever, itching, numbness, stiffness or tingling. She has tried nothing for the symptoms. Family history does not include gout. There is no history of diabetes.     Past Medical History:  Diagnosis Date  . Anemia    iron deficiency  . Diabetes ([place])   . Edema   . Migraine  headache      Family History  Problem Relation Age of Onset  . Diabetes Mother   . Hypertension Mother   . Diabetes Father   . Hypertension Father   . Kidney failure Father   . Hypertension Brother   . Diabetes Maternal Grandmother   . Breast cancer Maternal Grandmother   . Diabetes Paternal Grandmother   . Breast cancer Paternal Grandmother   . Cancer Maternal Aunt        breast  . Breast cancer Maternal Aunt      Current Outpatient Medications:  .  acetaZOLAMIDE (DIAMOX) 250 MG tablet, Take 1 tablet (250 mg total) by mouth 2 (two) times daily., Disp: 60 tablet, Rfl: 3 .  atorvastatin (LIPITOR) 10 MG tablet, TAKE 1 TABLET BY MOUTH EVERY DAY, Disp: 90 tablet, Rfl: 0 .  Blood Glucose Monitoring Suppl (ONETOUCH VERIO IQ SYSTEM) w/Device KIT, by Does not apply route. Use to check blood sugars twice, Disp: , Rfl:  .  citalopram (CELEXA) 40 MG tablet, Take 0.5 tablets (20 mg total) by mouth at bedtime., Disp: 90 tablet, Rfl: 1 .  clonazePAM (KLONOPIN) 0.5 MG tablet, Take 1 tablet (0.5 mg total) by mouth 3 (three) times daily as needed for anxiety., Disp: 30 tablet, Rfl: 1 .  Erenumab-aooe (AIMOVIG) 140 MG/ML SOAJ, Inject 140 mg into the skin every 30 (thirty) days., Disp: 1 pen, Rfl: 11 .  glucose blood (ONETOUCH VERIO) test strip,  1 each by Other route as needed for other. Insert 1 by subcutaneous route 2 times every day check blood sugar before breakfast and dinner, Disp: , Rfl:  .  Insulin Pen Needle (PEN NEEDLES) 32G X 4 MM MISC, 1 each by Does not apply route once a week., Disp: 30 each, Rfl: 3 .  Lifitegrast (XIIDRA) 5 % SOLN, Place 1 drop into both eyes daily., Disp: , Rfl:  .  lisinopril-hydrochlorothiazide (ZESTORETIC) 10-12.5 MG tablet, TAKE 1 TABLET BY MOUTH EVERY DAY, Disp: 90 tablet, Rfl: 0 .  pregabalin (LYRICA) 100 MG capsule, Take 1 capsule (100 mg total) by mouth 3 (three) times daily., Disp: 90 capsule, Rfl: 6 .  rizatriptan (MAXALT-MLT) 10 MG disintegrating tablet, Take  10 mg by mouth as needed for migraine. Take 1 tablet earliest onset of migraine.  May repeat x1 in 2 hours if needed.  Maximum 2 tablets in 24h, Disp: , Rfl:  .  TURMERIC PO, Take 1 capsule by mouth daily., Disp: , Rfl:  .  ketorolac (TORADOL) 10 MG tablet, Take 1 tablet (10 mg total) by mouth every 6 (six) hours as needed for moderate pain or severe pain., Disp: 20 tablet, Rfl: 0 .  Lasmiditan Succinate (REYVOW) 100 MG TABS, Take 1 tablet by mouth daily as needed. (Patient not taking: Reported on 04/30/2019), Disp: 16 tablet, Rfl: 3 .  metFORMIN (GLUCOPHAGE-XR) 500 MG 24 hr tablet, Take 2 tablets (1,000 mg total) by mouth 2 (two) times daily. (Patient not taking: Reported on 06/05/2019), Disp: 30 tablet, Rfl: 0 .  OZEMPIC, 1 MG/DOSE, 2 MG/1.5ML SOPN, INJECT 1 MG SUBCUTANEOUSLY EVERY WEEK ON THE SAME DAY IN ABDOMEN, THIGH, OR UPPER ARM (ROTATE), Disp: 9 pen, Rfl: 3   No Known Allergies   Review of Systems  Constitutional: Negative.  Negative for fever.  Eyes: Positive for photophobia.  Respiratory: Negative.  Negative for cough.   Cardiovascular: Negative.  Negative for chest pain, palpitations and leg swelling.  Musculoskeletal: Negative for stiffness.  Skin: Negative for itching.  Neurological: Positive for headaches. Negative for dizziness, tingling and numbness.  Psychiatric/Behavioral: Negative for agitation. The patient is not nervous/anxious.      Today's Vitals   06/05/19 1034  BP: 110/60  Pulse: 96  Temp: 98.3 F (36.8 C)  TempSrc: Oral  Weight: 200 lb (90.7 kg)  Height: 5' 6.4" (1.687 m)  PainSc: 10-Worst pain ever  PainLoc: Head   Body mass index is 31.89 kg/m.   Objective:  Physical Exam Constitutional:      Appearance: Normal appearance.  Cardiovascular:     Rate and Rhythm: Normal rate and regular rhythm.     Pulses: Normal pulses.     Heart sounds: Normal heart sounds. No murmur.  Pulmonary:     Effort: Pulmonary effort is normal. No respiratory distress.      Breath sounds: Normal breath sounds.  Skin:    Capillary Refill: Capillary refill takes less than 2 seconds.  Neurological:     General: No focal deficit present.     Mental Status: She is alert and oriented to person, place, and time.  Psychiatric:        Behavior: Behavior normal.        Thought Content: Thought content normal.        Judgment: Judgment normal.     Comments: She is extremely flat and not talking much this visit         Assessment And Plan:     1.  Other migraine without status migrainosus, not intractable  She has been having worsening migraines since the death of her mother  Toradol 29m given in office  She would benefit from time off from work  She is advised to have the counselor with her job to communicate with [place] 2 [place] to evaluate how long she would need to be out of work - ketorolac (TORADOL) injection 60 mg  2. Grief  Loss of her mother traumatic  She needs to seek counseling for grief counselor  She has also had several significant losses in the last year and has gone through a divorce in the last 2 years - Ambulatory referral to Psychology  3. Death of family member  See #2 - Ambulatory referral to Psychology  4. Acute pain of left shoulder  Tenderness to left shoulder with ROM to anterior bursa space  The Toradol should be effective for this pain as well        JMinette Brine FNP    THE PATIENT IS ENCOURAGED TO PRACTICE SOCIAL DISTANCING DUE TO THE COVID-19 PANDEMIC.

## 2019-06-09 ENCOUNTER — Encounter: Payer: Self-pay | Admitting: Nurse Practitioner

## 2019-06-10 NOTE — Addendum Note (Signed)
Addended by: [name] F on: 06/10/2019 10:14 PM   Modules accepted: Level of Service

## 2019-06-11 ENCOUNTER — Encounter: Payer: Self-pay | Admitting: *Deleted

## 2019-06-11 NOTE — Progress Notes (Signed)
Dr. Tomi Likens completed Short Term Disability forms for patient. Faxed to number provided [phone] and fax confirmation received.  Also copy mailed to patient per her request and one to med records to be scanned.

## 2019-06-17 DIAGNOSIS — Z0279 Encounter for issue of other medical certificate: Secondary | ICD-10-CM

## 2019-06-18 ENCOUNTER — Encounter: Payer: Self-pay | Admitting: Nurse Practitioner

## 2019-06-23 NOTE — Telephone Encounter (Signed)
Can you print these out for me please

## 2019-06-27 ENCOUNTER — Encounter: Payer: Self-pay | Admitting: Nurse Practitioner

## 2019-07-02 ENCOUNTER — Encounter: Payer: Self-pay | Admitting: Nurse Practitioner

## 2019-07-11 ENCOUNTER — Encounter: Payer: 59 | Admitting: Nurse Practitioner

## 2019-07-11 ENCOUNTER — Telehealth: Payer: Self-pay | Admitting: Neurology

## 2019-07-11 ENCOUNTER — Other Ambulatory Visit: Payer: Self-pay

## 2019-07-11 ENCOUNTER — Ambulatory Visit (INDEPENDENT_AMBULATORY_CARE_PROVIDER_SITE_OTHER): Payer: 59

## 2019-07-11 DIAGNOSIS — G43011 Migraine without aura, intractable, with status migrainosus: Secondary | ICD-10-CM | POA: Diagnosis not present

## 2019-07-11 MED ORDER — DIPHENHYDRAMINE HCL 50 MG/ML IJ SOLN
25.0000 mg | Freq: Once | INTRAMUSCULAR | Status: AC
  Administered 2019-07-11: 25 mg via INTRAMUSCULAR

## 2019-07-11 MED ORDER — METOCLOPRAMIDE HCL 5 MG/ML IJ SOLN
10.0000 mg | Freq: Once | INTRAMUSCULAR | Status: AC
  Administered 2019-07-11: 10 mg via INTRAMUSCULAR

## 2019-07-11 MED ORDER — KETOROLAC TROMETHAMINE 60 MG/2ML IM SOLN
60.0000 mg | Freq: Once | INTRAMUSCULAR | Status: AC
  Administered 2019-07-11: 15:00:00 60 mg via INTRAMUSCULAR

## 2019-07-11 MED ORDER — METOCLOPRAMIDE HCL 5 MG/5ML PO SOLN: 10.0000 mg | Freq: Once | ORAL | Status: DC

## 2019-07-11 NOTE — Telephone Encounter (Signed)
OK for migraine cocktail.  She will need a driver.

## 2019-07-11 NOTE — Telephone Encounter (Signed)
Patient has been experiencing a bad migraine and wanted to see if she could come in and get a headache cocktail today. Thanks!

## 2019-07-11 NOTE — Progress Notes (Signed)
toradol 60 mg bendyl 25 mg reglan 10 mg  Migraine cocktail given to patient in clinic today. Pt tolerated well she has driver with her.

## 2019-07-11 NOTE — Telephone Encounter (Signed)
Spoke with patient she is schedule for 3:00 pm today. She is aware to bring a driver

## 2019-07-11 NOTE — Telephone Encounter (Signed)
[place] for migraine cocktail last injection was 04/30/19?

## 2019-07-15 ENCOUNTER — Other Ambulatory Visit: Payer: Self-pay

## 2019-07-15 ENCOUNTER — Encounter: Payer: Self-pay | Admitting: Nurse Practitioner

## 2019-07-15 ENCOUNTER — Ambulatory Visit (INDEPENDENT_AMBULATORY_CARE_PROVIDER_SITE_OTHER): Payer: 59 | Admitting: Nurse Practitioner

## 2019-07-15 VITALS — BP 122/70 | HR 96 | Temp 98.3°F | Ht 66.6 in | Wt 198.6 lb

## 2019-07-15 DIAGNOSIS — Z0000 Encounter for general adult medical examination without abnormal findings: Secondary | ICD-10-CM

## 2019-07-15 DIAGNOSIS — Z23 Encounter for immunization: Secondary | ICD-10-CM | POA: Diagnosis not present

## 2019-07-15 DIAGNOSIS — G4709 Other insomnia: Secondary | ICD-10-CM

## 2019-07-15 DIAGNOSIS — F419 Anxiety disorder, unspecified: Secondary | ICD-10-CM

## 2019-07-15 DIAGNOSIS — E119 Type 2 diabetes mellitus without complications: Secondary | ICD-10-CM

## 2019-07-15 DIAGNOSIS — I10 Essential (primary) hypertension: Secondary | ICD-10-CM | POA: Diagnosis not present

## 2019-07-15 DIAGNOSIS — Z114 Encounter for screening for human immunodeficiency virus [HIV]: Secondary | ICD-10-CM | POA: Diagnosis not present

## 2019-07-15 DIAGNOSIS — Z1231 Encounter for screening mammogram for malignant neoplasm of breast: Secondary | ICD-10-CM | POA: Diagnosis not present

## 2019-07-15 DIAGNOSIS — F329 Major depressive disorder, single episode, unspecified: Secondary | ICD-10-CM

## 2019-07-15 DIAGNOSIS — Z139 Encounter for screening, unspecified: Secondary | ICD-10-CM

## 2019-07-15 LAB — POCT URINALYSIS DIPSTICK
Bilirubin, UA: NEGATIVE
Blood, UA: NEGATIVE
Glucose, UA: NEGATIVE
Ketones, UA: NEGATIVE
Leukocytes, UA: NEGATIVE
Nitrite, UA: NEGATIVE
Protein, UA: NEGATIVE
Spec Grav, UA: 1.02
Urobilinogen, UA: 0.2 U/dL
pH, UA: 8.5 — AB

## 2019-07-15 LAB — POCT UA - MICROALBUMIN
Albumin/Creatinine Ratio, Urine, POC: 30
Creatinine, POC: 200 mg/dL
Microalbumin Ur, POC: 10 mg/L

## 2019-07-15 MED ORDER — CITALOPRAM HYDROBROMIDE 40 MG PO TABS: 40.0000 mg | ORAL_TABLET | Freq: Every day | ORAL | 1 refills | Status: DC

## 2019-07-15 NOTE — Patient Instructions (Signed)
Health Maintenance  Topic Date Due  . PAP SMEAR-Modifier  12/08/2018  . FOOT EXAM  07/09/2019  . HEMOGLOBIN A1C  08/06/2019  . OPHTHALMOLOGY EXAM  10/11/2019  . TETANUS/TDAP  07/03/2027  . INFLUENZA VACCINE  Completed  . PNEUMOCOCCAL POLYSACCHARIDE VACCINE AGE 42-64 HIGH RISK  Completed  . HIV Screening  Completed    Health Maintenance, Female Adopting a healthy lifestyle and getting preventive care are important in promoting health and wellness. Ask your health care provider about:  The right schedule for you to have regular tests and exams.  Things you can do on your own to prevent diseases and keep yourself healthy. What should I know about diet, weight, and exercise? Eat a healthy diet   Eat a diet that includes plenty of vegetables, fruits, low-fat dairy products, and lean protein.  Do not eat a lot of foods that are high in solid fats, added sugars, or sodium. Maintain a healthy weight Body mass index (BMI) is used to identify weight problems. It estimates body fat based on height and weight. Your health care provider can help determine your BMI and help you achieve or maintain a healthy weight. Get regular exercise Get regular exercise. This is one of the most important things you can do for your health. Most adults should:  Exercise for at least 150 minutes each week. The exercise should increase your heart rate and make you sweat (moderate-intensity exercise).  Do strengthening exercises at least twice a week. This is in addition to the moderate-intensity exercise.  Spend less time sitting. Even light physical activity can be beneficial. Watch cholesterol and blood lipids Have your blood tested for lipids and cholesterol at 42 years of age, then have this test every 5 years. Have your cholesterol levels checked more often if:  Your lipid or cholesterol levels are high.  You are older than 42 years of age.  You are at high risk for heart disease. What should I know  about cancer screening? Depending on your health history and family history, you may need to have cancer screening at various ages. This may include screening for:  Breast cancer.  Cervical cancer.  Colorectal cancer.  Skin cancer.  Lung cancer. What should I know about heart disease, diabetes, and high blood pressure? Blood pressure and heart disease  High blood pressure causes heart disease and increases the risk of stroke. This is more likely to develop in people who have high blood pressure readings, are of African descent, or are overweight.  Have your blood pressure checked: ? Every 3-5 years if you are 71-41 years of age. ? Every year if you are 57 years old or older. Diabetes Have regular diabetes screenings. This checks your fasting blood sugar level. Have the screening done:  Once every three years after age 64 if you are at a normal weight and have a low risk for diabetes.  More often and at a younger age if you are overweight or have a high risk for diabetes. What should I know about preventing infection? Hepatitis B If you have a higher risk for hepatitis B, you should be screened for this virus. Talk with your health care provider to find out if you are at risk for hepatitis B infection. Hepatitis C Testing is recommended for:  Everyone born from [birth year] through 1964.  Anyone with known risk factors for hepatitis C. Sexually transmitted infections (STIs)  Get screened for STIs, including gonorrhea and chlamydia, if: ? You are sexually active  and are younger than 42 years of age. ? You are older than 42 years of age and your health care provider tells you that you are at risk for this type of infection. ? Your sexual activity has changed since you were last screened, and you are at increased risk for chlamydia or gonorrhea. Ask your health care provider if you are at risk.  Ask your health care provider about whether you are at high risk for HIV. Your health care  provider may recommend a prescription medicine to help prevent HIV infection. If you choose to take medicine to prevent HIV, you should first get tested for HIV. You should then be tested every 3 months for as long as you are taking the medicine. Pregnancy  If you are about to stop having your period (premenopausal) and you may become pregnant, seek counseling before you get pregnant.  Take 400 to 800 micrograms (mcg) of folic acid every day if you become pregnant.  Ask for birth control (contraception) if you want to prevent pregnancy. Osteoporosis and menopause Osteoporosis is a disease in which the bones lose minerals and strength with aging. This can result in bone fractures. If you are 38 years old or older, or if you are at risk for osteoporosis and fractures, ask your health care provider if you should:  Be screened for bone loss.  Take a calcium or vitamin D supplement to lower your risk of fractures.  Be given hormone replacement therapy (HRT) to treat symptoms of menopause. Follow these instructions at home: Lifestyle  Do not use any products that contain nicotine or tobacco, such as cigarettes, e-cigarettes, and chewing tobacco. If you need help quitting, ask your health care provider.  Do not use street drugs.  Do not share needles.  Ask your health care provider for help if you need support or information about quitting drugs. Alcohol use  Do not drink alcohol if: ? Your health care provider tells you not to drink. ? You are pregnant, may be pregnant, or are planning to become pregnant.  If you drink alcohol: ? Limit how much you use to 0-1 drink a day. ? Limit intake if you are breastfeeding.  Be aware of how much alcohol is in your drink. In the [place] one drink equals one 12 oz bottle of beer (355 mL), one 5 oz glass of wine (148 mL), or one 1 oz glass of hard liquor (44 mL). General instructions  Schedule regular health, dental, and eye exams.  Stay current  with your vaccines.  Tell your health care provider if: ? You often feel depressed. ? You have ever been abused or do not feel safe at home. Summary  Adopting a healthy lifestyle and getting preventive care are important in promoting health and wellness.  Follow your health care provider's instructions about healthy diet, exercising, and getting tested or screened for diseases.  Follow your health care provider's instructions on monitoring your cholesterol and blood pressure. This information is not intended to replace advice given to you by your health care provider. Make sure you discuss any questions you have with your health care provider. Document Released: 03/06/2011 Document Revised: 08/14/2018 Document Reviewed: 08/14/2018 Elsevier Patient Education  2020 [employer].

## 2019-07-15 NOTE — Progress Notes (Addendum)
Subjective:     Patient ID: Virginia Douglas , female    DOB: 1976/04/03 , 43 y.o.   MRN: [id number]   Chief Complaint  Patient presents with  . Annual Exam    HPI  Here for HM  Wt Readings from Last 3 Encounters: 07/15/19 : 198 lb 9.6 oz (90.1 kg) 06/05/19 : 200 lb (90.7 kg) 04/30/19 : 202 lb 6.4 oz (91.8 kg)  She seen Dr. Tomi Likens last Friday and given a migraine cocktail.  Low back   Insomnia Primary symptoms: fragmented sleep, sleep disturbance, frequent awakening.  The current episode started more than one month. The onset quality is sudden. The problem occurs nightly. The problem has been gradually worsening since onset. The symptoms are aggravated by family issues (tragic death of her mother). PMH includes: no hypertension, no depression.    The patient states she uses status post hysterectomy for birth control. Last LM .  Mammogram last done 08/20/2018.  Negative for: breast discharge, breast lump(s), breast pain and breast self exam.  Pertinent negatives include abnormal bleeding (hematology), anxiety, decreased libido, depression, difficulty falling sleep, dyspareunia, history of infertility, nocturia, sexual dysfunction, sleep disturbances, urinary incontinence, urinary urgency, vaginal discharge and vaginal itching. Diet regular. The patient states her exercise level is 1-2 times per week.       The patient's tobacco use is:  Social History   Tobacco Use  Smoking Status Former Smoker  . Packs/day: 0.50  . Years: 25.00  . Pack years: 12.50  . Types: Cigarettes  . Start date: 03/24/2019  Smokeless Tobacco Never Used   She has been exposed to passive smoke. The patient's alcohol use is:  Social History   Substance and Sexual Activity  Alcohol Use No  . Alcohol/week: 0.0 standard drinks   Additional information: Last pap Dr. Glo Herring in [place] - 2018, next one scheduled for 2021.   Past Medical History:  Diagnosis Date  . Anemia    iron deficiency  .  Diabetes ([place])   . Edema   . Migraine headache      Family History  Problem Relation Age of Onset  . Diabetes Mother   . Hypertension Mother   . Diabetes Father   . Hypertension Father   . Kidney failure Father   . Hypertension Brother   . Diabetes Maternal Grandmother   . Breast cancer Maternal Grandmother   . Diabetes Paternal Grandmother   . Breast cancer Paternal Grandmother   . Cancer Maternal Aunt        breast  . Breast cancer Maternal Aunt      Current Outpatient Medications:  .  acetaZOLAMIDE (DIAMOX) 250 MG tablet, Take 1 tablet (250 mg total) by mouth 2 (two) times daily., Disp: 60 tablet, Rfl: 3 .  atorvastatin (LIPITOR) 10 MG tablet, TAKE 1 TABLET BY MOUTH EVERY DAY, Disp: 90 tablet, Rfl: 0 .  Blood Glucose Monitoring Suppl (ONETOUCH VERIO IQ SYSTEM) w/Device KIT, by Does not apply route. Use to check blood sugars twice, Disp: , Rfl:  .  citalopram (CELEXA) 40 MG tablet, Take 0.5 tablets (20 mg total) by mouth at bedtime., Disp: 90 tablet, Rfl: 1 .  clonazePAM (KLONOPIN) 0.5 MG tablet, Take 1 tablet (0.5 mg total) by mouth 3 (three) times daily as needed for anxiety., Disp: 30 tablet, Rfl: 1 .  Erenumab-aooe (AIMOVIG) 140 MG/ML SOAJ, Inject 140 mg into the skin every 30 (thirty) days., Disp: 1 pen, Rfl: 11 .  glucose blood (ONETOUCH VERIO) test  strip, 1 each by Other route as needed for other. Insert 1 by subcutaneous route 2 times every day check blood sugar before breakfast and dinner, Disp: , Rfl:  .  Insulin Pen Needle (PEN NEEDLES) 32G X 4 MM MISC, 1 each by Does not apply route once a week., Disp: 30 each, Rfl: 3 .  Lasmiditan Succinate (REYVOW) 100 MG TABS, Take 1 tablet by mouth daily as needed., Disp: 16 tablet, Rfl: 3 .  Lifitegrast (XIIDRA) 5 % SOLN, Place 1 drop into both eyes daily., Disp: , Rfl:  .  lisinopril-hydrochlorothiazide (ZESTORETIC) 10-12.5 MG tablet, TAKE 1 TABLET BY MOUTH EVERY DAY, Disp: 90 tablet, Rfl: 0 .  OZEMPIC, 1 MG/DOSE, 2 MG/1.5ML  SOPN, INJECT 1 MG SUBCUTANEOUSLY EVERY WEEK ON THE SAME DAY IN ABDOMEN, THIGH, OR UPPER ARM (ROTATE), Disp: 9 pen, Rfl: 3 .  pregabalin (LYRICA) 100 MG capsule, Take 1 capsule (100 mg total) by mouth 3 (three) times daily., Disp: 90 capsule, Rfl: 6 .  rizatriptan (MAXALT-MLT) 10 MG disintegrating tablet, Take 10 mg by mouth as needed for migraine. Take 1 tablet earliest onset of migraine.  May repeat x1 in 2 hours if needed.  Maximum 2 tablets in 24h, Disp: , Rfl:  .  TURMERIC PO, Take 1 capsule by mouth daily., Disp: , Rfl:  .  ketorolac (TORADOL) 10 MG tablet, Take 1 tablet (10 mg total) by mouth every 6 (six) hours as needed for moderate pain or severe pain. (Patient not taking: Reported on 07/15/2019), Disp: 20 tablet, Rfl: 0 .  metFORMIN (GLUCOPHAGE-XR) 500 MG 24 hr tablet, Take 2 tablets (1,000 mg total) by mouth 2 (two) times daily. (Patient not taking: Reported on 06/05/2019), Disp: 30 tablet, Rfl: 0   No Known Allergies   Review of Systems  Constitutional: Negative.   HENT: Negative.   Eyes: Negative.   Respiratory: Negative.   Cardiovascular: Negative.   Gastrointestinal: Negative.   Endocrine: Negative.   Genitourinary: Negative.   Musculoskeletal: Negative.   Skin: Negative.   Allergic/Immunologic: Negative.   Neurological: Negative.   Hematological: Negative.   Psychiatric/Behavioral: Positive for sleep disturbance. Negative for depression. The patient has insomnia.      Today's Vitals   07/15/19 0913  BP: 122/70  Pulse: 96  Temp: 98.3 F (36.8 C)  TempSrc: Oral  Weight: 198 lb 9.6 oz (90.1 kg)  Height: 5' 6.6" (1.692 m)  PainSc: 6   PainLoc: Chest   Body mass index is 31.48 kg/m.   Objective:  Physical Exam Constitutional:      Appearance: She is well-developed.  HENT:     Head: Normocephalic and atraumatic.  Eyes:     Conjunctiva/sclera: Conjunctivae normal.     Pupils: Pupils are equal, round, and reactive to light.  Neck:     Musculoskeletal: Normal  range of motion and neck supple.  Cardiovascular:     Rate and Rhythm: Normal rate and regular rhythm.     Pulses: Normal pulses.     Heart sounds: Normal heart sounds. No murmur.  Pulmonary:     Effort: Pulmonary effort is normal. No respiratory distress.     Breath sounds: Normal breath sounds.  Abdominal:     General: Bowel sounds are normal.     Palpations: Abdomen is soft.  Musculoskeletal: Normal range of motion.  Skin:    General: Skin is warm and dry.     Capillary Refill: Capillary refill takes less than 2 seconds.  Neurological:  Mental Status: She is alert and oriented to person, place, and time.  Psychiatric:        Behavior: Behavior normal.         Assessment And Plan:   1. Health maintenance examination .  - CBC no Diff - CMP14 + Anion Gap  2. Type 2 diabetes mellitus without complication, without long-term current use of insulin (HCC)  Chronic, controlled  Continue with current medications  Diabetic foot exam done with decreased sensation to right dorsal foot  Discussed regular foot checks - atorvastatin (LIPITOR) 10 MG tablet; Take 1 tablet (10 mg total) by mouth daily.  Dispense: 90 tablet; Refill: 1 - CMP14 + Anion Gap - Hemoglobin A1c - Lipid Profile  3. Essential hypertension  Chronic, controlled  Continue with current medications - lisinopril-hydrochlorothiazide (PRINZIDE,ZESTORETIC) 10-12.5 MG tablet; Take 1 tablet by mouth daily.  Dispense: 90 tablet; Refill: 1  4. Encounter for screening for malignant neoplasm of breast  Pt instructed on Self Breast Exam.According to ACOG guidelines Women aged 97 and older are recommended to get an annual mammogram. Form completed and given to patient contact the [facility] for appointment scheduing.   Pt encouraged to get annual mammogram - MM Digital Screening; Future  5. Anxiety  She continues to have difficulty with focus  She is going to a psychiatrist and receiving  counseling.  She is back at work at this time.   1. Routine general medical examination at a health care facility . Behavior modifications discussed and diet history reviewed.   . Pt will continue to exercise regularly and modify diet with low GI, plant based foods and decrease intake of processed foods.  . Recommend intake of daily multivitamin, Vitamin D, and calcium.  . Recommend mammogram for preventive screenings, as well as recommend immunizations that include influenza, TDAP, and Shingles  2. Essential hypertension . B/P is controlled.  . CMP ordered to check renal function.  . The importance of regular exercise and dietary modification was stressed to the patient.  - POCT Urinalysis Dipstick (81002) - POCT UA - Microalbumin - EKG 12-Lead - CMP14+EGFR - CBC without diff  3. Type 2 diabetes mellitus without complication, without long-term current use of insulin (HCC)  Chronic, stable.  Continue with current medications  Encouraged to limit intake of sugary foods and drinks  Encouraged to increase physical activity to 150 minutes per week as tolerated when she is feeling better with her mood - Hemoglobin A1c - Lipid Profile  4. Anxiety  Continue as needed clonazepam - citalopram (CELEXA) 40 MG tablet; Take 1 tablet (40 mg total) by mouth at bedtime.  Dispense: 90 tablet; Refill: 1  5. Other insomnia  This is related to her grief, encouraged to continue her medications to help with her anxiety  6. Reactive depression  Continues to struggle with the sudden loss of her mother  She is encouraged to continue with counseling  7. Encounter for screening  - HIV antibody (with reflex)  8. Encounter for immunization  Pneumonia 23 given in office - Pneumococcal polysaccharide vaccine 23-valent greater than or equal to 2yo subcutaneous/IM     [name], FNP

## 2019-07-16 ENCOUNTER — Ambulatory Visit: Payer: 59 | Admitting: Neurology

## 2019-07-16 LAB — LIPID PANEL
Chol/HDL Ratio: 2.4 ratio (ref 0.0–4.4)
Cholesterol, Total: 133 mg/dL (ref 100–199)
HDL: 56 mg/dL (ref >39)
LDL Chol Calc (NIH): 66 mg/dL (ref 0–99)
Triglycerides: 48 mg/dL (ref 0–149)
VLDL Cholesterol Cal: 11 mg/dL (ref 5–40)

## 2019-07-16 LAB — CMP14+EGFR
ALT: 16 IU/L (ref 0–32)
AST: 21 IU/L (ref 0–40)
Albumin/Globulin Ratio: 1.8 (ref 1.2–2.2)
Albumin: 4.8 g/dL (ref 3.8–4.8)
Alkaline Phosphatase: 120 IU/L — ABNORMAL HIGH (ref 39–117)
BUN/Creatinine Ratio: 11 (ref 9–23)
BUN: 7 mg/dL (ref 6–24)
Bilirubin Total: 0.2 mg/dL (ref 0.0–1.2)
CO2: 20 mmol/L (ref 20–29)
Calcium: 10.1 mg/dL (ref 8.7–10.2)
Chloride: 108 mmol/L — ABNORMAL HIGH (ref 96–106)
Creatinine, Ser: 0.62 mg/dL (ref 0.57–1.00)
GFR calc Af Amer: 129 mL/min/{1.73_m2} (ref >59)
GFR calc non Af Amer: 112 mL/min/{1.73_m2} (ref >59)
Globulin, Total: 2.7 g/dL (ref 1.5–4.5)
Glucose: 77 mg/dL (ref 65–99)
Potassium: 4.1 mmol/L (ref 3.5–5.2)
Sodium: 144 mmol/L (ref 134–144)
Total Protein: 7.5 g/dL (ref 6.0–8.5)

## 2019-07-16 LAB — CBC
Hematocrit: 42.5 % (ref 34.0–46.6)
Hemoglobin: 14.2 g/dL (ref 11.1–15.9)
MCH: 29 pg (ref 26.6–33.0)
MCHC: 33.4 g/dL (ref 31.5–35.7)
MCV: 87 fL (ref 79–97)
Platelets: 299 10*3/uL (ref 150–450)
RBC: 4.9 x10E6/uL (ref 3.77–5.28)
RDW: 13.7 % (ref 11.7–15.4)
WBC: 9.4 10*3/uL (ref 3.4–10.8)

## 2019-07-16 LAB — HEMOGLOBIN A1C
Est. average glucose Bld gHb Est-mCnc: 120 mg/dL
Hgb A1c MFr Bld: 5.8 % — ABNORMAL HIGH (ref 4.8–5.6)

## 2019-07-16 LAB — HIV ANTIBODY (ROUTINE TESTING W REFLEX): HIV Screen 4th Generation wRfx: NONREACTIVE

## 2019-07-17 ENCOUNTER — Encounter: Payer: Self-pay | Admitting: Nurse Practitioner

## 2019-07-17 ENCOUNTER — Other Ambulatory Visit: Payer: Self-pay | Admitting: Nurse Practitioner

## 2019-07-17 DIAGNOSIS — F419 Anxiety disorder, unspecified: Secondary | ICD-10-CM

## 2019-07-17 NOTE — Telephone Encounter (Signed)
klonazepam refill

## 2019-07-21 NOTE — Telephone Encounter (Signed)
I filled out the forms in detail regarding headaches.  However, if they need forms filled out for PTSD, then I defer to her PCP.

## 2019-08-02 ENCOUNTER — Ambulatory Visit (HOSPITAL_COMMUNITY)
Admission: EM | Admit: 2019-08-02 | Discharge: 2019-08-02 | Disposition: A | Discharge status: Home / Self Care | Payer: 59 | Attending: Physician Assistant | Admitting: Physician Assistant

## 2019-08-02 ENCOUNTER — Encounter (HOSPITAL_COMMUNITY): Payer: Self-pay

## 2019-08-02 ENCOUNTER — Ambulatory Visit (INDEPENDENT_AMBULATORY_CARE_PROVIDER_SITE_OTHER): Payer: 59

## 2019-08-02 ENCOUNTER — Other Ambulatory Visit: Payer: Self-pay

## 2019-08-02 DIAGNOSIS — S96911A Strain of unspecified muscle and tendon at ankle and foot level, right foot, initial encounter: Secondary | ICD-10-CM

## 2019-08-02 IMAGING — DX DG FOOT COMPLETE 3+V*R*
3 series · 3 of 3 positions shown · non-contrast
Comparison: None.

CLINICAL DATA: Right foot pain for 1 day since a fall down steps.
Initial encounter.

EXAM:
RIGHT FOOT COMPLETE - 3+ VIEW

[foot ap]
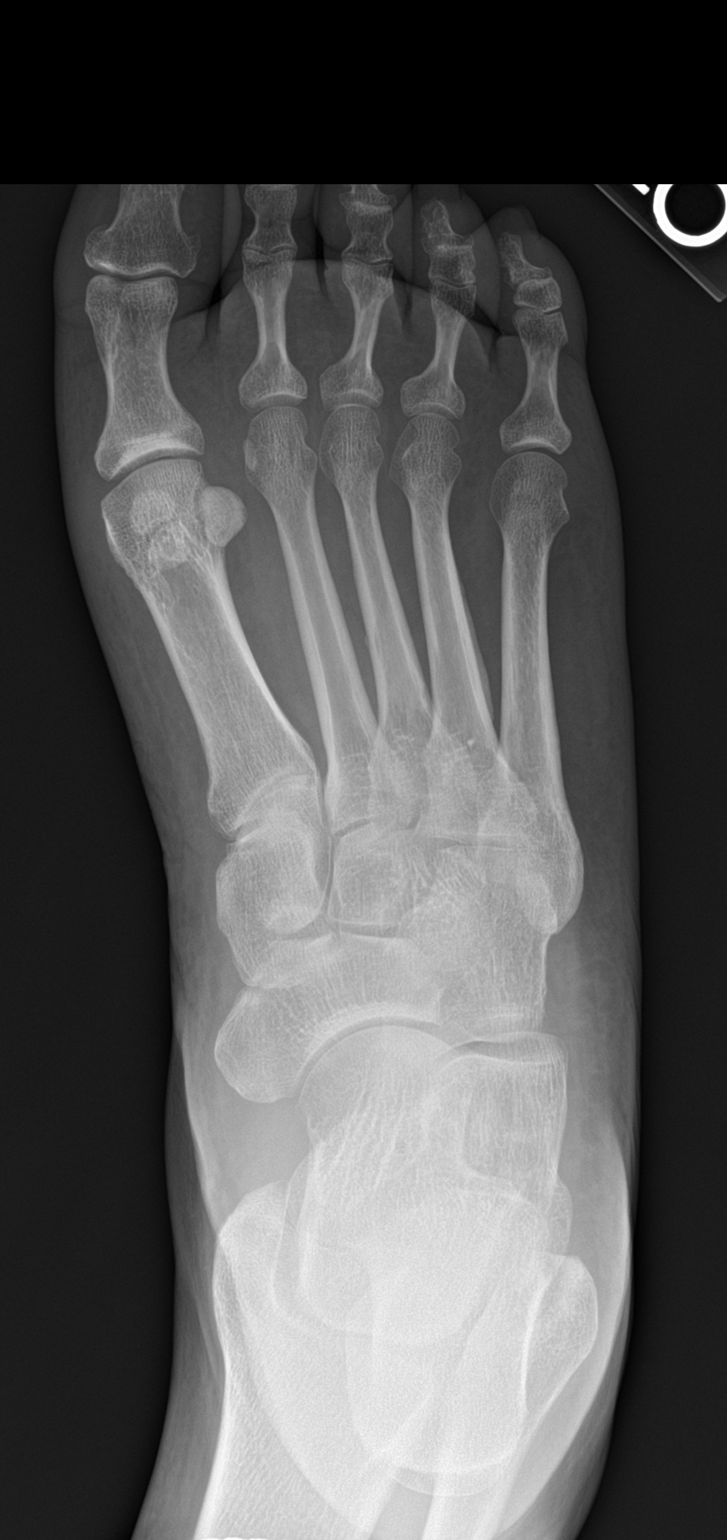

[foot obl]
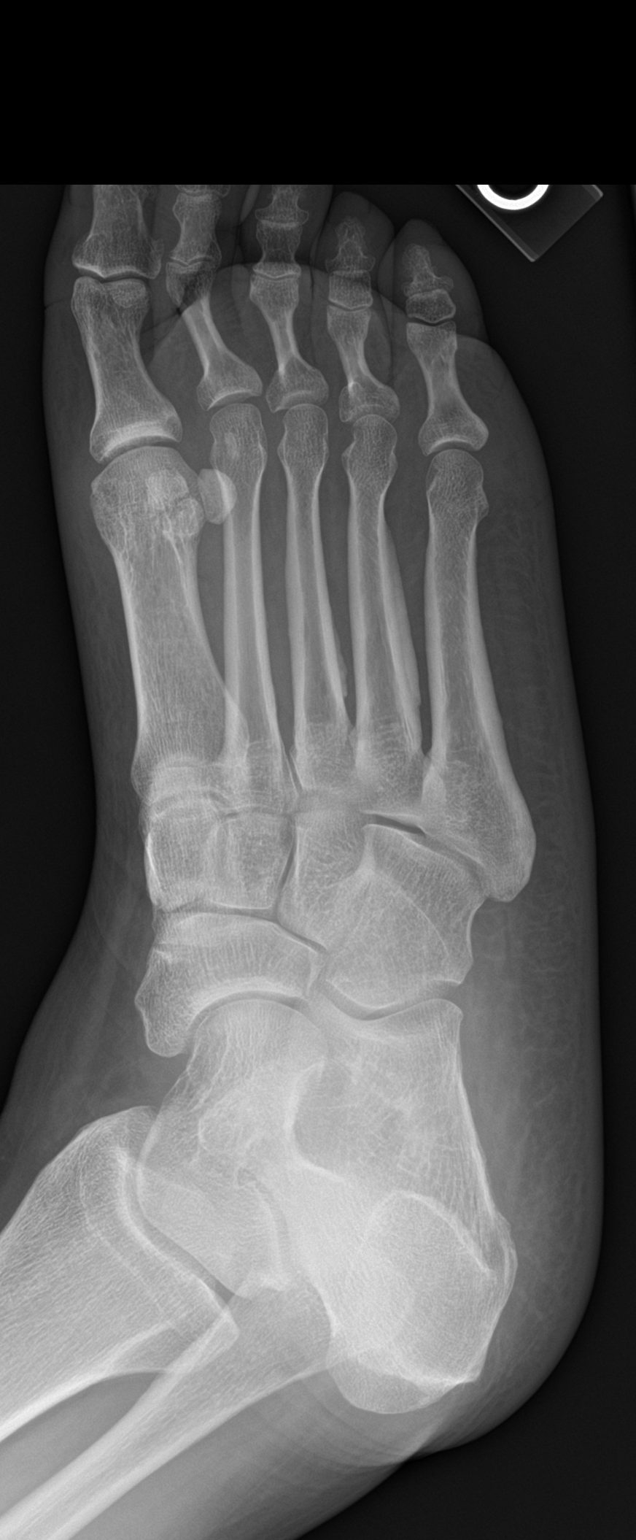

[foot lat]
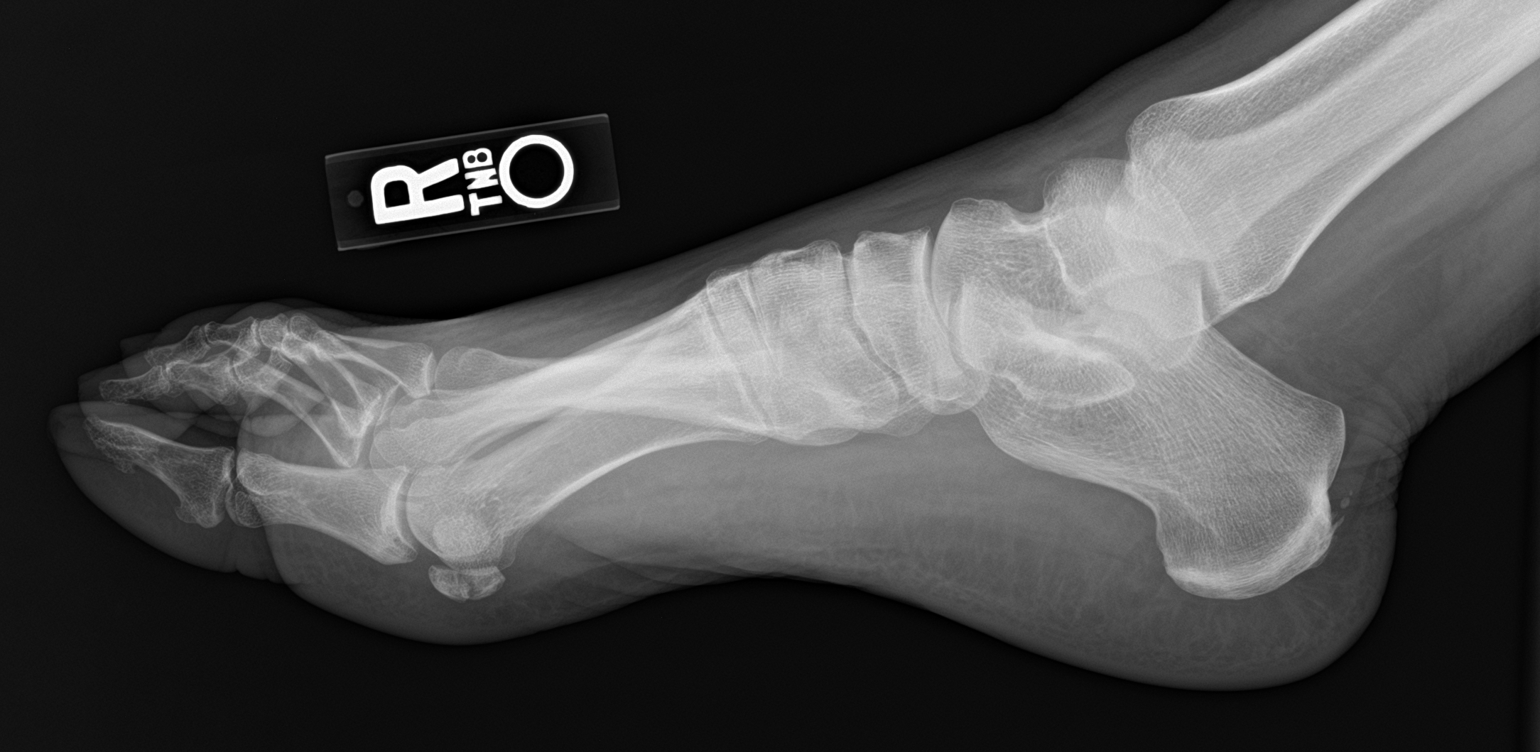

[3 of 3 positions shown; findings below may reference images not displayed]

FINDINGS: There is no evidence of fracture or dislocation. There is no
evidence of arthropathy or other focal bone abnormality. Soft
tissues are unremarkable.
IMPRESSION: Negative exam.

## 2019-08-02 NOTE — ED Provider Notes (Signed)
[place]    CSN: [id number] Arrival date & time: 08/02/19  1341      History   Chief Complaint Chief Complaint  Patient presents with  . Appointment    14:10  . Fall  . Toe Injury    HPI Virginia Douglas is a 43 y.o. female.   Patient here concerned with R foot pain x 1 day . She was at her [employer], she was wearing socks and fell down the stairs, causing pain R foot.  Pain located over 1st / 2nd metatarsal.  Admits pain, tenderness, swelling, ecchymosis, RROM of toes 2/2 pain.  She is not able to ambulate easily due to pain.       Past Medical History:  Diagnosis Date  . Anemia    iron deficiency  . Diabetes ([place])   . Edema   . Migraine headache     Patient Active Problem List   Diagnosis Date Noted  . Acute pain of left shoulder 06/05/2019  . Grief 06/05/2019  . Migraine 06/05/2019  . Type 2 diabetes mellitus without complication, without long-term current use of insulin ([place]) 04/07/2019  . Depression 04/07/2019  . Obesity due to excess calories without serious comorbidity 04/07/2019  . Left leg pain 04/07/2019  . Anxiety 04/07/2019  . Headache 07/06/2018  . Diabetes ([place]) 12/08/2015  . Essential hypertension 12/08/2015  . Smoker 12/08/2015  . LLQ pain 12/08/2015  . Paresthesia 09/16/2015  . Urinary urgency 09/16/2015    Past Surgical History:  Procedure Laterality Date  . ABDOMINAL HYSTERECTOMY    . CHOLECYSTECTOMY      OB History   No obstetric history on file.      Home Medications    Prior to Admission medications   Medication Sig Start Date End Date Taking? Authorizing Provider  acetaZOLAMIDE (DIAMOX) 250 MG tablet Take 1 tablet (250 mg total) by mouth 2 (two) times daily. 04/30/19   Tomi Likens, Adam R, DO  atorvastatin (LIPITOR) 10 MG tablet TAKE 1 TABLET BY MOUTH EVERY DAY 05/30/19   [name], FNP  Blood Glucose Monitoring Suppl (ONETOUCH VERIO IQ SYSTEM) w/Device KIT by Does not apply route. Use to check blood sugars  twice    [provider]  citalopram (CELEXA) 40 MG tablet Take 1 tablet (40 mg total) by mouth at bedtime. 07/15/19   [name], FNP  clonazePAM (KLONOPIN) 0.5 MG tablet TAKE 1 TABLET BY MOUTH 3 TIMES A DAY AS NEEDED FOR ANXIETY 07/17/19   [name], FNP  Erenumab-aooe (AIMOVIG) 140 MG/ML SOAJ Inject 140 mg into the skin every 30 (thirty) days. 11/05/18   Tomi Likens, Adam R, DO  glucose blood (ONETOUCH VERIO) test strip 1 each by Other route as needed for other. Insert 1 by subcutaneous route 2 times every day check blood sugar before breakfast and dinner    [provider]  Insulin Pen Needle (PEN NEEDLES) 32G X 4 MM MISC 1 each by Does not apply route once a week. 11/11/18   [name], FNP  ketorolac (TORADOL) 10 MG tablet Take 1 tablet (10 mg total) by mouth every 6 (six) hours as needed for moderate pain or severe pain. Patient not taking: Reported on 07/15/2019 04/26/19   Lucrezia Starch, MD  Lasmiditan Succinate (REYVOW) 100 MG TABS Take 1 tablet by mouth daily as needed. 03/12/19   Tomi Likens, Adam R, DO  Lifitegrast (XIIDRA) 5 % SOLN Place 1 drop into both eyes daily.    [provider]  lisinopril-hydrochlorothiazide (ZESTORETIC) 10-12.5 MG tablet TAKE 1 TABLET BY MOUTH EVERY DAY 05/30/19   [name], FNP  metFORMIN (GLUCOPHAGE-XR) 500 MG 24 hr tablet Take 2 tablets (1,000 mg total) by mouth 2 (two) times daily. Patient not taking: Reported on 06/05/2019 03/10/16   Long, Wonda Olds, MD  OZEMPIC, 1 MG/DOSE, 2 MG/1.5ML SOPN INJECT 1 MG SUBCUTANEOUSLY EVERY WEEK ON THE SAME DAY IN ABDOMEN, THIGH, OR UPPER ARM (ROTATE) 01/06/19   [name], FNP  pregabalin (LYRICA) 100 MG capsule Take 1 capsule (100 mg total) by mouth 3 (three) times daily. 01/15/19   Pieter Partridge, DO  rizatriptan (MAXALT-MLT) 10 MG disintegrating tablet Take 10 mg by mouth as needed for migraine. Take 1 tablet earliest onset of migraine.  May repeat x1 in 2 hours if needed.  Maximum 2 tablets in 24h     [provider]  TURMERIC PO Take 1 capsule by mouth daily.    [provider]  Phentermine-Topiramate 11.25-69 MG CP24 Take 1 tablet by mouth daily. 02/04/19 04/07/19  [name], FNP    Family History Family History  Problem Relation Age of Onset  . Diabetes Mother   . Hypertension Mother   . Diabetes Father   . Hypertension Father   . Kidney failure Father   . Hypertension Brother   . Diabetes Maternal Grandmother   . Breast cancer Maternal Grandmother   . Diabetes Paternal Grandmother   . Breast cancer Paternal Grandmother   . Cancer Maternal Aunt        breast  . Breast cancer Maternal Aunt     Social History Social History   Tobacco Use  . Smoking status: Former Smoker    Packs/day: 0.50    Years: 25.00    Pack years: 12.50    Types: Cigarettes    Start date: 03/24/2019  . Smokeless tobacco: Never Used  Substance Use Topics  . Alcohol use: No    Alcohol/week: 0.0 standard drinks  . Drug use: No     Allergies   Patient has no known allergies.   Review of Systems Review of Systems  Musculoskeletal: Positive for arthralgias, gait problem and joint swelling.  Skin: Positive for color change. Negative for wound.  Neurological: Positive for weakness. Negative for numbness.  Hematological: Negative for adenopathy. Does not bruise/bleed easily.  Psychiatric/Behavioral: Negative for confusion and sleep disturbance.  All other systems reviewed and are negative.    Physical Exam Triage Vital Signs ED Triage Vitals  Enc Vitals Group     BP 08/02/19 1419 127/72     Pulse Rate 08/02/19 1419 76     Resp 08/02/19 1419 16     Temp 08/02/19 1419 98.1 F (36.7 C)     Temp Source 08/02/19 1419 Oral     SpO2 08/02/19 1419 100 %     Weight --      Height --      Head Circumference --      Peak Flow --      Pain Score 08/02/19 1417 6     Pain Loc --      Pain Edu? --      Excl. in [place]? --    No data found.  Updated Vital Signs BP 127/72 (BP  Location: Left Arm)   Pulse 76   Temp 98.1 F (36.7 C) (Oral)   Resp 16   SpO2 100%   Visual Acuity Right Eye Distance:   Left Eye Distance:   Bilateral  Distance:    Right Eye Near:   Left Eye Near:    Bilateral Near:     Physical Exam Vitals signs and nursing note reviewed.  Constitutional:      General: She is not in acute distress.    Appearance: Normal appearance. She is well-developed.  HENT:     Head: Normocephalic and atraumatic.  Eyes:     General: No scleral icterus.    Conjunctiva/sclera: Conjunctivae normal.  Neck:     Musculoskeletal: Normal range of motion and neck supple.  Musculoskeletal:     Right foot: Normal range of motion and normal capillary refill. Tenderness, bony tenderness (over 1st metatarsal) and swelling (distal 1/3 of foot) present. No deformity.  Skin:    General: Skin is warm and dry.     Capillary Refill: Capillary refill takes less than 2 seconds.  Neurological:     General: No focal deficit present.     Mental Status: She is alert and oriented to person, place, and time.  Psychiatric:        Mood and Affect: Mood normal.        Behavior: Behavior normal.      UC Treatments / Results  Labs (all labs ordered are listed, but only abnormal results are displayed) Labs Reviewed - No data to display  EKG   Radiology Dg Foot Complete Right  Result Date: 08/02/2019 CLINICAL DATA:  Right foot pain for 1 day since a fall down steps. Initial encounter. EXAM: RIGHT FOOT COMPLETE - 3+ VIEW COMPARISON:  None. FINDINGS: There is no evidence of fracture or dislocation. There is no evidence of arthropathy or other focal bone abnormality. Soft tissues are unremarkable. IMPRESSION: Negative exam. Electronically Signed   By: Inge Rise M.D.   On: 08/02/2019 16:30    Procedures Procedures (including critical care time)  Medications Ordered in UC Medications - No data to display  Initial Impression / Assessment and Plan / UC Course  I  have reviewed the triage vital signs and the nursing notes.  Pertinent labs & imaging results that were available during my care of the patient were reviewed by me and considered in my medical decision making (see chart for details).     Xray reviewed by myself, no fracture noted. Final Clinical Impressions(s) / UC Diagnoses   Final diagnoses:  Strain of right foot, initial encounter     Discharge Instructions     Ice foot 15 minutes 4 times per day. Keep foot elevated. Take ibuprofen every 6 to 8 hours as needed.    ED Prescriptions    None     PDMP not reviewed this encounter.   Peri Jefferson, PA-C 08/02/19 1642

## 2019-08-02 NOTE — Discharge Instructions (Signed)
Ice foot 15 minutes 4 times per day. Keep foot elevated. Take ibuprofen every 6 to 8 hours as needed.

## 2019-08-02 NOTE — ED Triage Notes (Signed)
Pt reports she fell yesterday going down the stairs and injured her right big toe.

## 2019-08-04 ENCOUNTER — Other Ambulatory Visit: Payer: Self-pay | Admitting: Nurse Practitioner

## 2019-08-04 ENCOUNTER — Encounter: Payer: Self-pay | Admitting: Nurse Practitioner

## 2019-08-04 DIAGNOSIS — F419 Anxiety disorder, unspecified: Secondary | ICD-10-CM

## 2019-08-04 MED ORDER — BUSPIRONE HCL 5 MG PO TABS: 5.0000 mg | ORAL_TABLET | Freq: Two times a day (BID) | ORAL | 2 refills | Status: DC

## 2019-08-15 ENCOUNTER — Other Ambulatory Visit: Payer: Self-pay

## 2019-08-15 ENCOUNTER — Encounter (HOSPITAL_COMMUNITY): Payer: Self-pay | Admitting: Emergency Medicine

## 2019-08-15 ENCOUNTER — Ambulatory Visit (HOSPITAL_COMMUNITY)
Admission: EM | Admit: 2019-08-15 | Discharge: 2019-08-15 | Disposition: A | Discharge status: Home / Self Care | Payer: 59 | Attending: Family Medicine | Admitting: Family Medicine

## 2019-08-15 DIAGNOSIS — R05 Cough: Secondary | ICD-10-CM | POA: Diagnosis not present

## 2019-08-15 DIAGNOSIS — J069 Acute upper respiratory infection, unspecified: Secondary | ICD-10-CM

## 2019-08-15 DIAGNOSIS — Z20822 Contact with and (suspected) exposure to covid-19: Secondary | ICD-10-CM

## 2019-08-15 DIAGNOSIS — R0602 Shortness of breath: Secondary | ICD-10-CM

## 2019-08-15 DIAGNOSIS — Z20828 Contact with and (suspected) exposure to other viral communicable diseases: Secondary | ICD-10-CM | POA: Insufficient documentation

## 2019-08-15 MED ORDER — BENZONATATE 200 MG PO CAPS: 200.0000 mg | ORAL_CAPSULE | Freq: Two times a day (BID) | ORAL | 0 refills | Status: DC | PRN

## 2019-08-15 NOTE — ED Triage Notes (Signed)
Pt here for cold sx onset 3 days associated w/coughing, sneezing, nasal drainage, body aches, loss of taste, chest discomfort and SOB  Denies fevers  A&O x4... NAD.Marland Kitchen. ambulatory

## 2019-08-15 NOTE — ED Provider Notes (Signed)
[place]    CSN: [id number] Arrival date & time: 08/15/19  1021      History   Chief Complaint Chief Complaint  Patient presents with  . URI    HPI Virginia Douglas is a 43 y.o. female.   HPI  Patient states she is had cough and cold symptoms for 3 to 4 days.  She has postnasal drip, stuffy nose, sneezing, sore throat, loss of taste, mild shortness of breath, pain with coughing.  Decreased appetite.  Sweats and chills with no fever.  Definite fatigue.  No known exposure to coronavirus.  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  She does wear a mask and try to social distance.  She lives alone  Past Medical History:  Diagnosis Date  . Anemia    iron deficiency  . Diabetes ([place])   . Edema   . Migraine headache     Patient Active Problem List   Diagnosis Date Noted  . Acute pain of left shoulder 06/05/2019  . Grief 06/05/2019  . Migraine 06/05/2019  . Type 2 diabetes mellitus without complication, without long-term current use of insulin ([place]) 04/07/2019  . Depression 04/07/2019  . Obesity due to excess calories without serious comorbidity 04/07/2019  . Left leg pain 04/07/2019  . Anxiety 04/07/2019  . Headache 07/06/2018  . Diabetes ([place]) 12/08/2015  . Essential hypertension 12/08/2015  . Smoker 12/08/2015  . LLQ pain 12/08/2015  . Paresthesia 09/16/2015  . Urinary urgency 09/16/2015    Past Surgical History:  Procedure Laterality Date  . ABDOMINAL HYSTERECTOMY    . CHOLECYSTECTOMY      OB History   No obstetric history on file.      Home Medications    Prior to Admission medications   Medication Sig Start Date End Date Taking? Authorizing Provider  acetaZOLAMIDE (DIAMOX) 250 MG tablet Take 1 tablet (250 mg total) by mouth 2 (two) times daily. 04/30/19   Tomi Likens, Adam R, DO  atorvastatin (LIPITOR) 10 MG tablet TAKE 1 TABLET BY MOUTH EVERY DAY 05/30/19   [name], FNP  benzonatate (TESSALON) 200  MG capsule Take 1 capsule (200 mg total) by mouth 2 (two) times daily as needed for cough. 08/15/19   Raylene Everts, MD  Blood Glucose Monitoring Suppl (ONETOUCH VERIO IQ SYSTEM) w/Device KIT by Does not apply route. Use to check blood sugars twice    [provider]  busPIRone (BUSPAR) 5 MG tablet Take 1 tablet (5 mg total) by mouth 2 (two) times daily. 08/04/19   [name], FNP  citalopram (CELEXA) 40 MG tablet Take 1 tablet (40 mg total) by mouth at bedtime. 07/15/19   [name], FNP  clonazePAM (KLONOPIN) 0.5 MG tablet TAKE 1 TABLET BY MOUTH 3 TIMES A DAY AS NEEDED FOR ANXIETY 07/17/19   [name], FNP  Erenumab-aooe (AIMOVIG) 140 MG/ML SOAJ Inject 140 mg into the skin every 30 (thirty) days. 11/05/18   Tomi Likens, Adam R, DO  glucose blood (ONETOUCH VERIO) test strip 1 each by Other route as needed for other. Insert 1 by subcutaneous route 2 times every day check blood sugar before breakfast and dinner    [provider]  Insulin Pen Needle (PEN NEEDLES) 32G X 4 MM MISC 1 each by Does not apply route once a week. 11/11/18   [name], FNP  Lasmiditan Succinate (REYVOW) 100 MG TABS Take 1 tablet by mouth daily as needed. 03/12/19  Jaffe, Adam R, DO  Lifitegrast (XIIDRA) 5 % SOLN Place 1 drop into both eyes daily.    [provider]  lisinopril-hydrochlorothiazide (ZESTORETIC) 10-12.5 MG tablet TAKE 1 TABLET BY MOUTH EVERY DAY 05/30/19   [name], FNP  OZEMPIC, 1 MG/DOSE, 2 MG/1.5ML SOPN INJECT 1 MG SUBCUTANEOUSLY EVERY WEEK ON THE SAME DAY IN ABDOMEN, THIGH, OR UPPER ARM (ROTATE) 01/06/19   [name], FNP  pregabalin (LYRICA) 100 MG capsule Take 1 capsule (100 mg total) by mouth 3 (three) times daily. 01/15/19   Pieter Partridge, DO  rizatriptan (MAXALT-MLT) 10 MG disintegrating tablet Take 10 mg by mouth as needed for migraine. Take 1 tablet earliest onset of migraine.  May repeat x1 in 2 hours if needed.  Maximum 2 tablets in 24h    [provider]   TURMERIC PO Take 1 capsule by mouth daily.    [provider]  metFORMIN (GLUCOPHAGE-XR) 500 MG 24 hr tablet Take 2 tablets (1,000 mg total) by mouth 2 (two) times daily. Patient not taking: Reported on 06/05/2019 03/10/16 08/15/19  [name], MD  Phentermine-Topiramate 11.25-69 MG CP24 Take 1 tablet by mouth daily. 02/04/19 04/07/19  [name], FNP    Family History Family History  Problem Relation Age of Onset  . Diabetes Mother   . Hypertension Mother   . Diabetes Father   . Hypertension Father   . Kidney failure Father   . Hypertension Brother   . Diabetes Maternal Grandmother   . Breast cancer Maternal Grandmother   . Diabetes Paternal Grandmother   . Breast cancer Paternal Grandmother   . Cancer Maternal Aunt        breast  . Breast cancer Maternal Aunt     Social History Social History   Tobacco Use  . Smoking status: Former Smoker    Packs/day: 0.50    Years: 25.00    Pack years: 12.50    Types: Cigarettes    Start date: 03/24/2019  . Smokeless tobacco: Never Used  Substance Use Topics  . Alcohol use: No    Alcohol/week: 0.0 standard drinks  . Drug use: No     Allergies   Patient has no known allergies.   Review of Systems Review of Systems  Constitutional: Positive for appetite change, chills, diaphoresis and fatigue. Negative for fever.  HENT: Positive for congestion, postnasal drip, rhinorrhea, sneezing and sore throat. Negative for hearing loss.   Eyes: Negative for pain.  Respiratory: Positive for cough and shortness of breath.   Cardiovascular: Negative for chest pain and leg swelling.  Gastrointestinal: Negative for abdominal pain, constipation and diarrhea.  Genitourinary: Negative for dysuria and frequency.  Musculoskeletal: Negative for myalgias.  Neurological: Negative for dizziness, seizures and headaches.  Psychiatric/Behavioral: The patient is not nervous/anxious.      Physical Exam Triage Vital Signs ED Triage Vitals   Enc Vitals Group     BP 08/15/19 1031 105/71     Pulse Rate 08/15/19 1031 89     Resp 08/15/19 1031 16     Temp 08/15/19 1031 98.5 F (36.9 C)     Temp Source 08/15/19 1031 Oral     SpO2 08/15/19 1031 98 %     Weight --      Height --      Head Circumference --      Peak Flow --      Pain Score 08/15/19 1032 8     Pain Loc --  Pain Edu? --      Excl. in [place]? --    No data found.  Updated Vital Signs BP 105/71 (BP Location: Right Arm)   Pulse 89   Temp 98.5 F (36.9 C) (Oral)   Resp 16   SpO2 98%      Physical Exam Constitutional:      General: She is not in acute distress.    Appearance: She is well-developed.     Comments: Appears ill.  Tired.  Mildly overweight  HENT:     Head: Normocephalic and atraumatic.     Mouth/Throat:     Comments: Mask in place.  Oropharynx benign. Eyes:     Conjunctiva/sclera: Conjunctivae normal.     Pupils: Pupils are equal, round, and reactive to light.  Cardiovascular:     Rate and Rhythm: Normal rate and regular rhythm.     Heart sounds: Normal heart sounds.  Pulmonary:     Effort: Pulmonary effort is normal. No respiratory distress.     Breath sounds: Normal breath sounds.     Comments: Breath sounds normal Abdominal:     General: There is no distension.     Palpations: Abdomen is soft.  Musculoskeletal:        General: Normal range of motion.     Cervical back: Normal range of motion and neck supple.  Skin:    General: Skin is warm and dry.  Neurological:     Mental Status: She is alert.  Psychiatric:        Mood and Affect: Mood normal.        Behavior: Behavior normal.      UC Treatments / Results  Labs (all labs ordered are listed, but only abnormal results are displayed) Labs Reviewed  NOVEL CORONAVIRUS, NAA (HOSP ORDER, SEND-OUT TO REF LAB; TAT 18-24 HRS)    EKG   Radiology No results found.  Procedures Procedures (including critical care time)  Medications Ordered in UC Medications - No data  to display  Initial Impression / Assessment and Plan / UC Course  I have reviewed the triage vital signs and the nursing notes.  Pertinent labs & imaging results that were available during my care of the patient were reviewed by me and considered in my medical decision making (see chart for details).     Covid testing is done.  Patient is told how to get her results.  Reviewed with symptomatic care at home.  Reasons for return Final Clinical Impressions(s) / UC Diagnoses   Final diagnoses:  Viral URI with cough  Suspected COVID-19 virus infection     Discharge Instructions     Rest and push fluids Take tylenol for pain or fever Take tessalon for cough Your result will be available on MyChart [facility] until you get your test result   ED Prescriptions    Medication Sig Dispense Auth. Provider   benzonatate (TESSALON) 200 MG capsule Take 1 capsule (200 mg total) by mouth 2 (two) times daily as needed for cough. 20 capsule Raylene Everts, MD     PDMP not reviewed this encounter.   Raylene Everts, MD 08/15/19 1201

## 2019-08-15 NOTE — Discharge Instructions (Addendum)
Rest and push fluids Take tylenol for pain or fever Take tessalon for cough Your result will be available on MyChart [facility] until you get your test result

## 2019-08-17 LAB — NOVEL CORONAVIRUS, NAA (HOSP ORDER, SEND-OUT TO REF LAB; TAT 18-24 HRS): SARS-CoV-2, NAA: NOT DETECTED

## 2019-08-21 ENCOUNTER — Other Ambulatory Visit: Payer: Self-pay | Admitting: Nurse Practitioner

## 2019-08-21 DIAGNOSIS — E119 Type 2 diabetes mellitus without complications: Secondary | ICD-10-CM

## 2019-08-21 DIAGNOSIS — I10 Essential (primary) hypertension: Secondary | ICD-10-CM

## 2019-08-26 ENCOUNTER — Other Ambulatory Visit: Payer: Self-pay | Admitting: Neurology

## 2019-08-26 ENCOUNTER — Encounter: Payer: Self-pay | Admitting: Neurology

## 2019-08-26 NOTE — Progress Notes (Signed)
Virtual Visit via Video Note The purpose of this virtual visit is to provide medical care while limiting exposure to the novel coronavirus.    Consent was obtained for video visit:  Yes.   Answered questions that patient had about telehealth interaction:  Yes.   I discussed the limitations, risks, security and privacy concerns of performing an evaluation and management service by telemedicine. I also discussed with the patient that there may be a patient responsible charge related to this service. The patient expressed understanding and agreed to proceed.  Pt location: Home Physician Location: office Name of referring provider:  [name], FNP I connected with Virginia Douglas at patients initiation/request on 08/27/2019 at 10:10 AM EST by video enabled telemedicine application and verified that I am speaking with the correct person using two identifiers. [name]:  [id number] Pt DOB:  07-20-1976 Video Participants:  Virginia Douglas   History of Present Illness:  Virginia Douglas a 43 year old right-handed woman with diabetes and hypertension who follows up for migrainesand lumbar radiculopathy.  UPDATE: Her mother was murdered in September.  Due to this tragic event, she had increased frequency of migraines in which I filled out short term disability papers because she wasn't able to work.  They are now moderate to severe occurring once a week, lasting 4 or 5 hours.     Rescue protocol:Maxalt,Zofran 47m, Robaxin Current NSAIDS:none Current analgesics:none Other current abortive: none Current triptans:Maxalt-MLT 186mCurrent ergotamine:None Current anti-emetic:Zofran 4 mg Current muscle relaxants:Robaxin Current anti-anxiolytic:alprazolam Current sleep aide:none Current Antihypertensive medications:Lisinopril-HCTZ Current Antidepressant medications:Citalopram 2062murrent Anticonvulsant medications:Topiramate 100 mg at bedtime, acetazolamide 250m19mwice daily, Lyrica 100mg34mee times daily. Current anti-CGRP:Aimovig140mg 66ment Vitamins/Herbal/Supplements:turmeric Current Antihistamines/Decongestants:none Other therapy:none Hormone/birth control:no  Caffeine:One soda a week Diet:Hydrates Exercise:Yes Depression:Yes; Anxiety:Yes Other pain:No Sleep hygiene:Varies  HISTORY: Onset:Remote history of migraines. Controlled for many years. Returned in [id number] Kitchenion:Right sided head/ear radiating down right side of neck. Does not radiate down the right arm. Quality:Pounding in head, burning in neck Initial intensity:Severe.Shedenies thunderclap headache or severe headache that wakes herfrom sleep. Aura:no Prodrome:no Postdrome:no Associated symptoms: Nausea, vomiting, photophobia, phonophobia, blurred vision. She denies associated unilateral numbness or weakness. Initial duration:1 day InitialFrequency:2 days a week InitialFrequency of abortive medication:2 days a week Triggers: Emotional stress Relieving factors: Heating pad, Robaxin Activity:Aggravates  She was evaluated in the ED on 02/10/18, where CTA of head and neck was performed and personally reviewed. It demonstrated empty sella but otherwise unremarkable for mass lesion, aneurysm or dissection. She was diagnosed with iidiopathic intracranial hypertension diagnosed in 2019.She is on Diamox 250mg d36m.She is followed by ophthalmology.Exam note from 10/10/18 stated "maybe slight nasal nerve elevation" but no definite signs of papilledema.  She reports left sided low back pain radiating down the side of the left leg with associated numbness in the side of leg and footsince early 2020She had a lumbar X-ray which demonstrated mild dextroscoliosis apex L4. No injury. Gabapentin previously used was ineffective for neuropathic pain.   Past NSAIDS:Ibuprofen, naproxen Past analgesics: tramadol 50mg Pa5mabortive triptans:Sumatriptan 100 mg, rizatriptan 10mg Pas58mscle relaxants: Flexeril Past anti-emetic:no Past antihypertensive medications:no Past antidepressant medications: nortriptyline 50mg(caus32mausea/vomiting, problems sleeping) Past anticonvulsant medications: gabapentin 300mg twice34mly(for neuropathic pain), topiramate 25mg(ineffe63me) Past vitamins/Herbal/Supplements:no Past antihistamines/decongestants:no Other past therapies:no  Family history of headache:no  MRI of cervical spine from 09/22/15 was personally reviewed and was unremarkable.  Past Medical History: Past Medical History:  Diagnosis Date  . Anemia    iron deficiency  .  Diabetes ([place])   . Edema   . Migraine headache     Medications: Outpatient Encounter Medications as of 08/27/2019  Medication Sig Note  . acetaZOLAMIDE (DIAMOX) 250 MG tablet Take 1 tablet (250 mg total) by mouth 2 (two) times daily.   Marland Kitchen atorvastatin (LIPITOR) 10 MG tablet TAKE 1 TABLET BY MOUTH EVERY DAY   . benzonatate (TESSALON) 200 MG capsule Take 1 capsule (200 mg total) by mouth 2 (two) times daily as needed for cough.   . Blood Glucose Monitoring Suppl (ONETOUCH VERIO IQ SYSTEM) w/Device KIT by Does not apply route. Use to check blood sugars twice   . busPIRone (BUSPAR) 5 MG tablet Take 1 tablet (5 mg total) by mouth 2 (two) times daily.   . citalopram (CELEXA) 40 MG tablet Take 1 tablet (40 mg total) by mouth at bedtime.   . clonazePAM (KLONOPIN) 0.5 MG tablet TAKE 1 TABLET BY MOUTH 3 TIMES A DAY AS NEEDED FOR ANXIETY   . Erenumab-aooe (AIMOVIG) 140 MG/ML SOAJ Inject 140 mg into the skin every 30 (thirty) days.   Marland Kitchen glucose blood (ONETOUCH VERIO) test strip 1 each by Other route as needed for other. Insert 1 by subcutaneous route 2 times every day check blood sugar before breakfast and dinner   . Insulin Pen Needle (PEN NEEDLES) 32G X 4 MM MISC 1 each by Does not apply route once a week.   [place]  Succinate (REYVOW) 100 MG TABS Take 1 tablet by mouth daily as needed. 04/30/2019: Has had samples only  . Lifitegrast (XIIDRA) 5 % SOLN Place 1 drop into both eyes daily.   Marland Kitchen lisinopril-hydrochlorothiazide (ZESTORETIC) 10-12.5 MG tablet TAKE 1 TABLET BY MOUTH EVERY DAY   . OZEMPIC, 1 MG/DOSE, 2 MG/1.5ML SOPN INJECT 1 MG SUBCUTANEOUSLY EVERY WEEK ON THE SAME DAY IN ABDOMEN, THIGH, OR UPPER ARM (ROTATE)   . pregabalin (LYRICA) 100 MG capsule Take 1 capsule (100 mg total) by mouth 3 (three) times daily.   . rizatriptan (MAXALT-MLT) 10 MG disintegrating tablet Take 10 mg by mouth as needed for migraine. Take 1 tablet earliest onset of migraine.  May repeat x1 in 2 hours if needed.  Maximum 2 tablets in 24h 04/30/2019: Never got this med  . TURMERIC PO Take 1 capsule by mouth daily.   . [DISCONTINUED] metFORMIN (GLUCOPHAGE-XR) 500 MG 24 hr tablet Take 2 tablets (1,000 mg total) by mouth 2 (two) times daily. (Patient not taking: Reported on 06/05/2019)   . [DISCONTINUED] Phentermine-Topiramate 11.25-69 MG CP24 Take 1 tablet by mouth daily.    No facility-administered encounter medications on file as of 08/27/2019.    Allergies: No Known Allergies  Family History: Family History  Problem Relation Age of Onset  . Diabetes Mother   . Hypertension Mother   . Diabetes Father   . Hypertension Father   . Kidney failure Father   . Hypertension Brother   . Diabetes Maternal Grandmother   . Breast cancer Maternal Grandmother   . Diabetes Paternal Grandmother   . Breast cancer Paternal Grandmother   . Cancer Maternal Aunt        breast  . Breast cancer Maternal Aunt     Social History: Social History   Socioeconomic History  . Marital status: Legally Separated    Spouse name: Jeneen Rinks  . Number of children: 2  . Years of education: 38  . Highest education level: Some college, no degree  Occupational History  . Occupation: Web designer  Employer: [employer]  Tobacco  Use  . Smoking status: Former Smoker    Packs/day: 0.50    Years: 25.00    Pack years: 12.50    Types: Cigarettes    Start date: 03/24/2019  . Smokeless tobacco: Never Used  Substance and Sexual Activity  . Alcohol use: No    Alcohol/week: 0.0 standard drinks  . Drug use: No  . Sexual activity: Yes    Birth control/protection: Surgical  Other Topics Concern  . 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 She lives with her 2 children in a one story house. She drinks one large cup of coffee a day and an occasional tea or soda. She walks daily.      Social Determinants of Health   Financial Resource Strain:   . Difficulty of Paying Living Expenses: Not on file  Food Insecurity:   . Worried About Charity fundraiser in the Last Year: Not on file  . Ran Out of Food in the Last Year: Not on file  Transportation Needs:   . Lack of Transportation (Medical): Not on file  . Lack of Transportation (Non-Medical): Not on file  Physical Activity:   . Days of Exercise per Week: Not on file  . Minutes of Exercise per Session: Not on file  Stress:   . Feeling of Stress : Not on file  Social Connections:   . Frequency of Communication with Friends and Family: Not on file  . Frequency of Social Gatherings with Friends and Family: Not on file  . Attends Religious Services: Not on file  . Active Member of Clubs or Organizations: Not on file  . Attends Archivist Meetings: Not on file  . Marital Status: Not on file  Intimate Partner Violence:   . Fear of Current or Ex-Partner: Not on file  . Emotionally Abused: Not on file  . Physically Abused: Not on file  . Sexually Abused: Not on file    Observations/Objective:   Height 5' 7"  (1.702 m), weight 189 lb (85.7 kg). No acute distress.   Alert and oriented.  Speech fluent and not dysarthric.  Language intact.  Eyes orthophoric on primary gaze.  Face symmetric.  Assessment and Plan:   1.  Migraine without aura, without status migrainosus, intractable.   2.  History of idiopathic intracranial hypertension.  1.  For preventative management, Aimovig 178m monthly 2.  For abortive therapy, will try to get her Reyvow financial assistance program. 3.  Limit use of pain relievers to no more than 2 days out of week to prevent risk of rebound or medication-overuse headache. 4.  Keep headache diary 5.  Exercise, hydration, caffeine cessation, sleep hygiene, monitor for and avoid triggers 6.  Consider:  magnesium citrate 4060mdaily, riboflavin 40049maily, and coenzyme Q10 100m3mree times daily 7. Always keep in mind that currently taking a hormone or birth control may be a possible trigger or aggravating factor for migraine. 8. Follow up 4 months.  Follow Up Instructions:    -I discussed the assessment and treatment plan with the patient. The patient was provided an opportunity to ask questions and all were answered. The patient agreed with the plan and demonstrated an understanding of the instructions.  The patient was advised to call back or seek an in-person evaluation if the symptoms worsen or if the condition fails to improve as anticipated.    Dudley Major, DO

## 2019-08-27 ENCOUNTER — Telehealth (INDEPENDENT_AMBULATORY_CARE_PROVIDER_SITE_OTHER): Payer: 59 | Admitting: Neurology

## 2019-08-27 ENCOUNTER — Other Ambulatory Visit: Payer: Self-pay

## 2019-08-27 ENCOUNTER — Encounter: Payer: Self-pay | Admitting: Neurology

## 2019-08-27 ENCOUNTER — Other Ambulatory Visit: Payer: Self-pay | Admitting: Nurse Practitioner

## 2019-08-27 VITALS — Ht 67.0 in | Wt 189.0 lb

## 2019-08-27 DIAGNOSIS — G43009 Migraine without aura, not intractable, without status migrainosus: Secondary | ICD-10-CM | POA: Diagnosis not present

## 2019-08-27 DIAGNOSIS — F419 Anxiety disorder, unspecified: Secondary | ICD-10-CM

## 2019-09-02 ENCOUNTER — Ambulatory Visit: Payer: 59 | Admitting: Neurology

## 2019-09-19 ENCOUNTER — Other Ambulatory Visit: Payer: Self-pay | Admitting: Nurse Practitioner

## 2019-09-19 DIAGNOSIS — Z1231 Encounter for screening mammogram for malignant neoplasm of breast: Secondary | ICD-10-CM

## 2019-10-02 ENCOUNTER — Other Ambulatory Visit: Payer: Self-pay

## 2019-10-02 DIAGNOSIS — I10 Essential (primary) hypertension: Secondary | ICD-10-CM

## 2019-10-02 DIAGNOSIS — E119 Type 2 diabetes mellitus without complications: Secondary | ICD-10-CM

## 2019-10-02 MED ORDER — ATORVASTATIN CALCIUM 10 MG PO TABS: 10.0000 mg | ORAL_TABLET | Freq: Every day | ORAL | 1 refills | Status: DC

## 2019-10-02 MED ORDER — LISINOPRIL-HYDROCHLOROTHIAZIDE 10-12.5 MG PO TABS: 1.0000 | ORAL_TABLET | Freq: Every day | ORAL | 1 refills | Status: DC

## 2019-10-16 ENCOUNTER — Other Ambulatory Visit: Payer: Self-pay

## 2019-10-16 ENCOUNTER — Encounter: Payer: Self-pay | Admitting: Nurse Practitioner

## 2019-10-16 ENCOUNTER — Telehealth: Payer: Self-pay

## 2019-10-16 ENCOUNTER — Ambulatory Visit (INDEPENDENT_AMBULATORY_CARE_PROVIDER_SITE_OTHER): Payer: 59 | Admitting: Nurse Practitioner

## 2019-10-16 ENCOUNTER — Telehealth: Payer: Self-pay | Admitting: Neurology

## 2019-10-16 VITALS — BP 110/64 | HR 87 | Temp 98.1°F | Ht 68.6 in | Wt 205.2 lb

## 2019-10-16 DIAGNOSIS — R413 Other amnesia: Secondary | ICD-10-CM

## 2019-10-16 DIAGNOSIS — I10 Essential (primary) hypertension: Secondary | ICD-10-CM | POA: Diagnosis not present

## 2019-10-16 DIAGNOSIS — E119 Type 2 diabetes mellitus without complications: Secondary | ICD-10-CM | POA: Diagnosis not present

## 2019-10-16 DIAGNOSIS — E669 Obesity, unspecified: Secondary | ICD-10-CM

## 2019-10-16 DIAGNOSIS — F419 Anxiety disorder, unspecified: Secondary | ICD-10-CM | POA: Diagnosis not present

## 2019-10-16 DIAGNOSIS — R41840 Attention and concentration deficit: Secondary | ICD-10-CM

## 2019-10-16 DIAGNOSIS — F32A Depression, unspecified: Secondary | ICD-10-CM

## 2019-10-16 DIAGNOSIS — F329 Major depressive disorder, single episode, unspecified: Secondary | ICD-10-CM

## 2019-10-16 DIAGNOSIS — Z6830 Body mass index (BMI) 30.0-30.9, adult: Secondary | ICD-10-CM

## 2019-10-16 MED ORDER — NALTREXONE-BUPROPION HCL ER 8-90 MG PO TB12: ORAL_TABLET | ORAL | 0 refills | Status: DC

## 2019-10-16 MED ORDER — NALTREXONE-BUPROPION HCL ER 8-90 MG PO TB12: ORAL_TABLET | ORAL | 1 refills | Status: DC

## 2019-10-16 NOTE — Telephone Encounter (Signed)
PA for contrave has been submitted through covermymeds and we are waiting on the determination. YRL,RMA

## 2019-10-16 NOTE — Telephone Encounter (Signed)
Patient called in needing to let Dr. Tomi Likens know that the Prescription for Reyvow is not at her [facility] in [place]. She would like a new prescription sent in for her please. Thank you

## 2019-10-16 NOTE — Progress Notes (Signed)
Subjective:     Patient ID: Virginia Douglas , female    DOB: 02-Nov-1975 , 44 y.o.   MRN: [id number]   Chief Complaint  Patient presents with  . Diabetes    HPI  Weight check -   Wt Readings from Last 3 Encounters:  10/16/19 : 205 lb 3.2 oz (93.1 kg) 08/26/19 : 189 lb (85.7 kg) 07/15/19 : 198 lb 9.6 oz (90.1 kg)  She feels she is more depressed than anything to get better.   Diabetes She presents for her follow-up diabetic visit. She has type 2 diabetes mellitus. Her disease course has been stable. There are no hypoglycemic associated symptoms. Pertinent negatives for hypoglycemia include no confusion, dizziness or nervousness/anxiousness. There are no diabetic associated symptoms. Pertinent negatives for diabetes include no chest pain, no fatigue, no polydipsia, no polyphagia and no polyuria. There are no hypoglycemic complications. Symptoms are stable. There are no diabetic complications. Risk factors for coronary artery disease include hypertension, diabetes mellitus, sedentary lifestyle and obesity. She is compliant with treatment all of the time. When asked about meal planning, she reported none. She has not had a previous visit with a dietitian. She rarely participates in exercise. There is no change in her home blood glucose trend. (Not checking her blood sugar) An ACE inhibitor/angiotensin II receptor blocker is being taken. Eye exam is not current.     Past Medical History:  Diagnosis Date  . Anemia    iron deficiency  . Diabetes ([place])   . Edema   . Migraine headache      Family History  Problem Relation Age of Onset  . Diabetes Mother   . Hypertension Mother   . Diabetes Father   . Hypertension Father   . Kidney failure Father   . Hypertension Brother   . Diabetes Maternal Grandmother   . Breast cancer Maternal Grandmother   . Diabetes Paternal Grandmother   . Breast cancer Paternal Grandmother   . Cancer Maternal Aunt        breast  . Breast cancer Maternal  Aunt      Current Outpatient Medications:  .  atorvastatin (LIPITOR) 10 MG tablet, Take 1 tablet (10 mg total) by mouth daily., Disp: 90 tablet, Rfl: 1 .  benzonatate (TESSALON) 200 MG capsule, Take 1 capsule (200 mg total) by mouth 2 (two) times daily as needed for cough., Disp: 20 capsule, Rfl: 0 .  Blood Glucose Monitoring Suppl (ONETOUCH VERIO IQ SYSTEM) w/Device KIT, by Does not apply route. Use to check blood sugars twice, Disp: , Rfl:  .  busPIRone (BUSPAR) 5 MG tablet, TAKE 1 TABLET BY MOUTH TWICE A DAY, Disp: 180 tablet, Rfl: 1 .  citalopram (CELEXA) 40 MG tablet, Take 1 tablet (40 mg total) by mouth at bedtime., Disp: 90 tablet, Rfl: 1 .  clonazePAM (KLONOPIN) 0.5 MG tablet, TAKE 1 TABLET BY MOUTH 3 TIMES A DAY AS NEEDED FOR ANXIETY, Disp: 30 tablet, Rfl: 1 .  Erenumab-aooe (AIMOVIG) 140 MG/ML SOAJ, Inject 140 mg into the skin every 30 (thirty) days., Disp: 1 pen, Rfl: 11 .  glucose blood (ONETOUCH VERIO) test strip, 1 each by Other route as needed for other. Insert 1 by subcutaneous route 2 times every day check blood sugar before breakfast and dinner, Disp: , Rfl:  .  Insulin Pen Needle (PEN NEEDLES) 32G X 4 MM MISC, 1 each by Does not apply route once a week., Disp: 30 each, Rfl: 3 .  Lifitegrast (XIIDRA) 5 %  SOLN, Place 1 drop into both eyes daily., Disp: , Rfl:  .  lisinopril-hydrochlorothiazide (ZESTORETIC) 10-12.5 MG tablet, Take 1 tablet by mouth daily., Disp: 90 tablet, Rfl: 1 .  OZEMPIC, 1 MG/DOSE, 2 MG/1.5ML SOPN, INJECT 1 MG SUBCUTANEOUSLY EVERY WEEK ON THE SAME DAY IN ABDOMEN, THIGH, OR UPPER ARM (ROTATE), Disp: 9 pen, Rfl: 3 .  pregabalin (LYRICA) 100 MG capsule, Take 1 capsule (100 mg total) by mouth 3 (three) times daily., Disp: 90 capsule, Rfl: 6 .  rizatriptan (MAXALT-MLT) 10 MG disintegrating tablet, Take 10 mg by mouth as needed for migraine. Take 1 tablet earliest onset of migraine.  May repeat x1 in 2 hours if needed.  Maximum 2 tablets in 24h, Disp: , Rfl:  .   TURMERIC PO, Take 1 capsule by mouth daily., Disp: , Rfl:  .  acetaZOLAMIDE (DIAMOX) 250 MG tablet, TAKE 1 TABLET BY MOUTH TWICE A DAY (Patient not taking: Reported on 10/16/2019), Disp: 180 tablet, Rfl: 1 .  Lasmiditan Succinate (REYVOW) 100 MG TABS, Take 1 tablet by mouth daily as needed. (Patient not taking: Reported on 08/26/2019), Disp: 16 tablet, Rfl: 3   No Known Allergies   Review of Systems  Constitutional: Negative.  Negative for fatigue.  Respiratory: Negative.   Cardiovascular: Negative.  Negative for chest pain.  Gastrointestinal: Negative.   Endocrine: Negative.  Negative for polydipsia, polyphagia and polyuria.  Skin: Negative.   Neurological: Negative.  Negative for dizziness.  Psychiatric/Behavioral: Negative.  Negative for confusion. The patient is not nervous/anxious.      Today's Vitals   10/16/19 0920  BP: 110/64  Pulse: 87  Temp: 98.1 F (36.7 C)  TempSrc: Oral  Weight: 205 lb 3.2 oz (93.1 kg)  Height: 5' 8.6" (1.742 m)  PainSc: 4    Body mass index is 30.66 kg/m.   Objective:  Physical Exam Vitals reviewed.  Constitutional:      General: She is not in acute distress.    Appearance: Normal appearance. She is well-developed. She is obese.  HENT:     Head: Normocephalic and atraumatic.  Eyes:     Pupils: Pupils are equal, round, and reactive to light.  Cardiovascular:     Rate and Rhythm: Normal rate and regular rhythm.     Pulses: Normal pulses.     Heart sounds: Normal heart sounds. No murmur.  Pulmonary:     Effort: Pulmonary effort is normal.     Breath sounds: Normal breath sounds.  Musculoskeletal:        General: Normal range of motion.  Skin:    General: Skin is warm and dry.     Capillary Refill: Capillary refill takes less than 2 seconds.  Neurological:     General: No focal deficit present.     Mental Status: She is alert and oriented to person, place, and time.     Cranial Nerves: No cranial nerve deficit.  Psychiatric:         Mood and Affect: Mood normal.        Behavior: Behavior normal.        Thought Content: Thought content normal.        Judgment: Judgment normal.         Assessment And Plan:     1. Type 2 diabetes mellitus without complication, without long-term current use of insulin (HCC)  Chronic, controlled  Continue with current medications to include Ozempic  Encouraged to limit intake of sugary foods and drinks  Encouraged to increase  physical activity to 150 minutes per week  2. Lack of concentration  She continues to have difficulty with concentration   I will refer her to neuropsych 1 3. Essential hypertension  Chronic, excellent control  Continue with current medications  4. Anxiety  Continues to have episodes of anxiety  She is seeing a counselor regularly  Maintaining on as needed clonazepam  5. Obesity (BMI 30-39.9)  Chronic  Discussed healthy diet and regular exercise options   Encouraged to exercise at least 150 minutes per week with 2 days of strength training  Will try her on contrave - the wellbutrin may be effective for her mood if approved may need to discontinue citalopram     [name], FNP    THE PATIENT IS ENCOURAGED TO PRACTICE SOCIAL DISTANCING DUE TO THE COVID-19 PANDEMIC.

## 2019-10-16 NOTE — Telephone Encounter (Signed)
PA for contrave has been approved both pharmacy and pt made aware. YRL,RMA

## 2019-10-17 ENCOUNTER — Other Ambulatory Visit: Payer: Self-pay

## 2019-10-17 MED ORDER — REYVOW 100 MG PO TABS: 1.0000 | ORAL_TABLET | Freq: Every day | ORAL | 3 refills | Status: DC | PRN

## 2019-10-21 ENCOUNTER — Encounter: Payer: Self-pay | Admitting: Nurse Practitioner

## 2019-10-21 LAB — CMP14+EGFR
ALT: 14 IU/L (ref 0–32)
AST: 18 IU/L (ref 0–40)
Albumin/Globulin Ratio: 1.8 (ref 1.2–2.2)
Albumin: 4.7 g/dL (ref 3.8–4.8)
Alkaline Phosphatase: 113 IU/L (ref 39–117)
BUN/Creatinine Ratio: 11 (ref 9–23)
BUN: 7 mg/dL (ref 6–24)
Bilirubin Total: 0.2 mg/dL (ref 0.0–1.2)
CO2: 19 mmol/L — ABNORMAL LOW (ref 20–29)
Calcium: 10 mg/dL (ref 8.7–10.2)
Chloride: 105 mmol/L (ref 96–106)
Creatinine, Ser: 0.64 mg/dL (ref 0.57–1.00)
GFR calc Af Amer: 127 mL/min/{1.73_m2} (ref >59)
GFR calc non Af Amer: 110 mL/min/{1.73_m2} (ref >59)
Globulin, Total: 2.6 g/dL (ref 1.5–4.5)
Glucose: 88 mg/dL (ref 65–99)
Potassium: 3.7 mmol/L (ref 3.5–5.2)
Sodium: 139 mmol/L (ref 134–144)
Total Protein: 7.3 g/dL (ref 6.0–8.5)

## 2019-10-21 LAB — VITAMIN D 25 HYDROXY (VIT D DEFICIENCY, FRACTURES): Vit D, 25-Hydroxy: 22 ng/mL — ABNORMAL LOW (ref 30.0–100.0)

## 2019-10-21 LAB — TSH: TSH: 0.872 u[IU]/mL (ref 0.450–4.500)

## 2019-10-21 LAB — VITAMIN B12: Vitamin B-12: 388 pg/mL (ref 232–1245)

## 2019-10-21 LAB — LIPID PANEL
Chol/HDL Ratio: 2.3 ratio (ref 0.0–4.4)
Cholesterol, Total: 122 mg/dL (ref 100–199)
HDL: 53 mg/dL (ref >39)
LDL Chol Calc (NIH): 57 mg/dL (ref 0–99)
Triglycerides: 51 mg/dL (ref 0–149)
VLDL Cholesterol Cal: 12 mg/dL (ref 5–40)

## 2019-10-21 LAB — HEMOGLOBIN A1C
Est. average glucose Bld gHb Est-mCnc: 120 mg/dL
Hgb A1c MFr Bld: 5.8 % — ABNORMAL HIGH (ref 4.8–5.6)

## 2019-10-21 MED ORDER — VITAMIN D (ERGOCALCIFEROL) 1.25 MG (50000 UNIT) PO CAPS: 50000.0000 [IU] | ORAL_CAPSULE | ORAL | 0 refills | Status: DC

## 2019-10-23 ENCOUNTER — Other Ambulatory Visit: Payer: Self-pay | Admitting: Nurse Practitioner

## 2019-10-23 DIAGNOSIS — F419 Anxiety disorder, unspecified: Secondary | ICD-10-CM

## 2019-10-23 MED ORDER — CLONAZEPAM 0.5 MG PO TABS: ORAL_TABLET | ORAL | 1 refills | Status: DC

## 2019-10-24 ENCOUNTER — Other Ambulatory Visit: Payer: Self-pay

## 2019-10-24 MED ORDER — PREGABALIN 100 MG PO CAPS: 100.0000 mg | ORAL_CAPSULE | Freq: Three times a day (TID) | ORAL | 6 refills | Status: DC

## 2019-10-27 ENCOUNTER — Telehealth: Payer: Self-pay | Admitting: Neurology

## 2019-10-27 ENCOUNTER — Encounter: Payer: Self-pay | Admitting: Nurse Practitioner

## 2019-10-27 NOTE — Telephone Encounter (Signed)
Virginia Douglas (Key: [id number]) Rx #[id number] Reyvow 100MG  tablets   Form [employer] Electronic PA Form (NCPDP) Created 2 days ago Sent to Plan 3 minutes ago Plan Response 3 minutes ago Submit Clinical Questions less than a minute ago Determination Wait for Determination Please wait for [employer]NCPDP to return a determination.

## 2019-10-28 ENCOUNTER — Other Ambulatory Visit: Payer: Self-pay

## 2019-10-28 ENCOUNTER — Ambulatory Visit
Admission: RE | Admit: 2019-10-28 | Discharge: 2019-10-28 | Disposition: A | Discharge status: Home / Self Care | Payer: 59 | Source: Ambulatory Visit | Attending: Nurse Practitioner | Admitting: Nurse Practitioner

## 2019-10-28 DIAGNOSIS — Z1231 Encounter for screening mammogram for malignant neoplasm of breast: Secondary | ICD-10-CM

## 2019-10-28 IMAGING — MG DIGITAL SCREENING BILAT W/ TOMO W/ CAD
8 series · 8 of 24 positions shown · non-contrast
Comparison: Previous exam(s).

CLINICAL DATA: Screening.

EXAM:
DIGITAL SCREENING BILATERAL MAMMOGRAM WITH TOMO AND CAD

[R CC synth-2D]
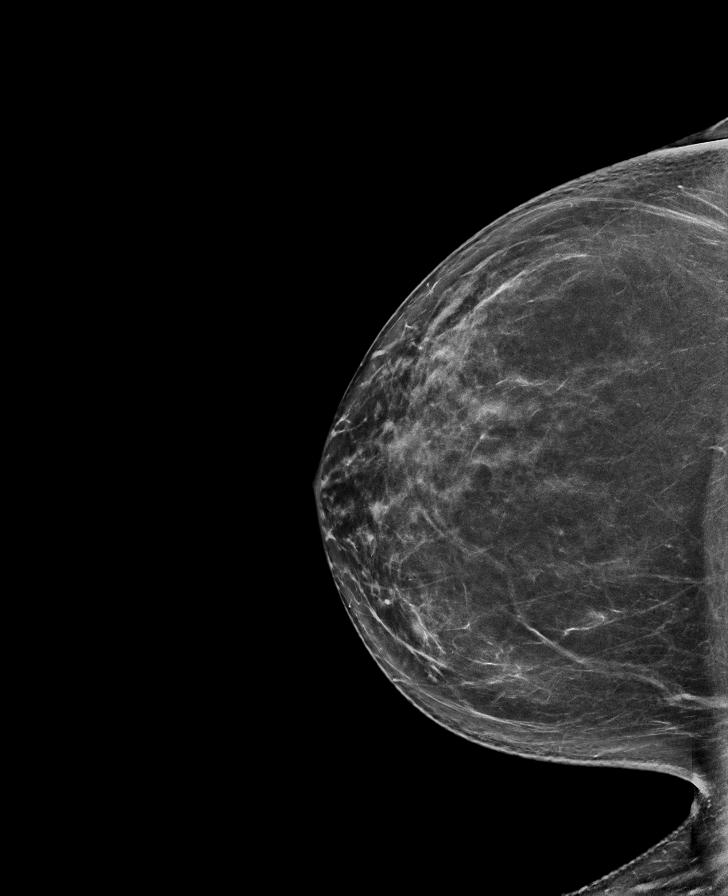

[L MLO synth-2D]
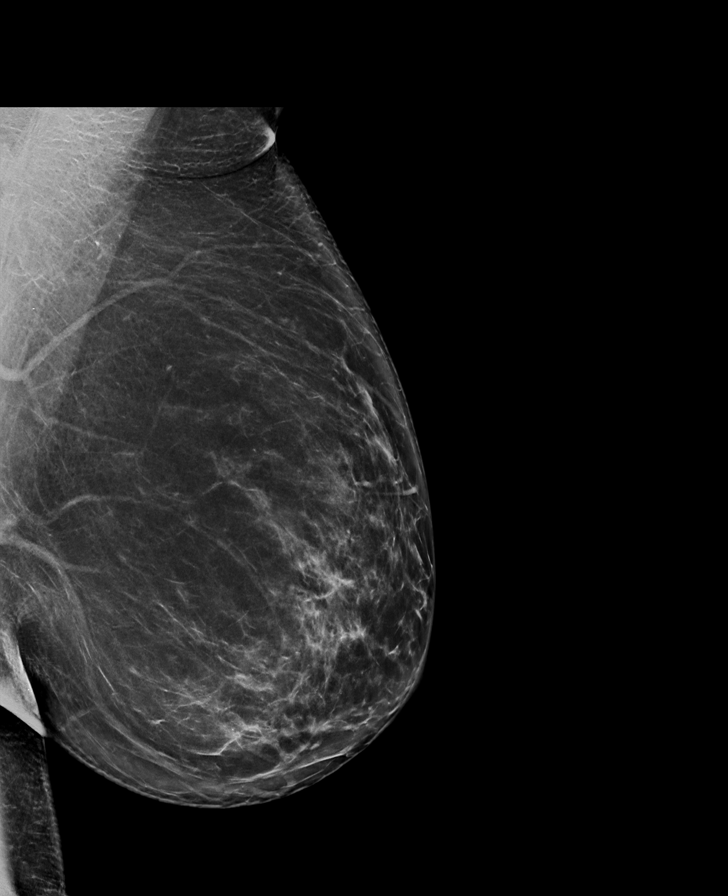

[L CC synth-2D]
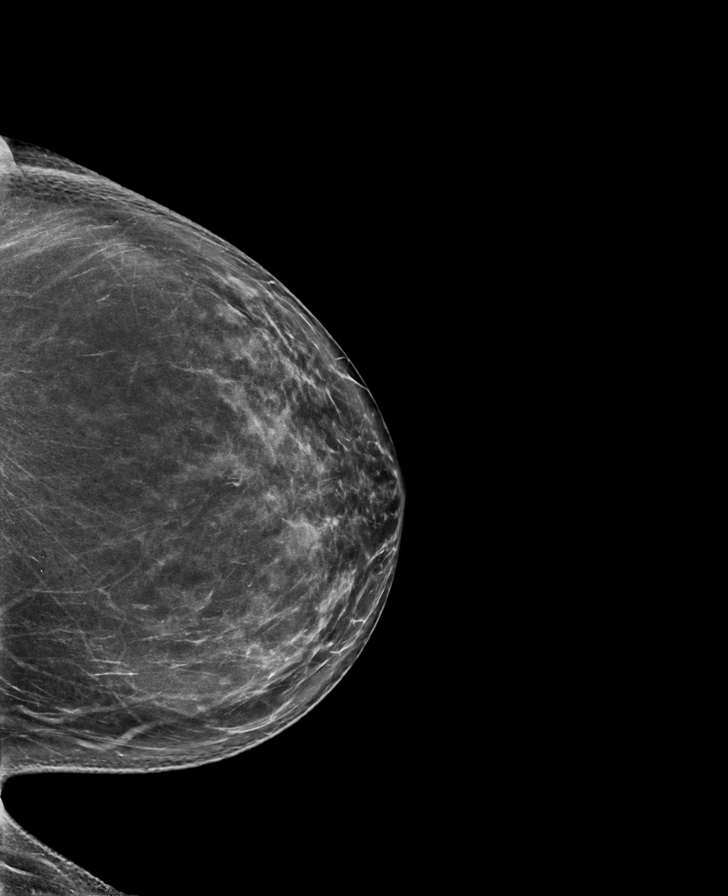

[R MLO synth-2D]
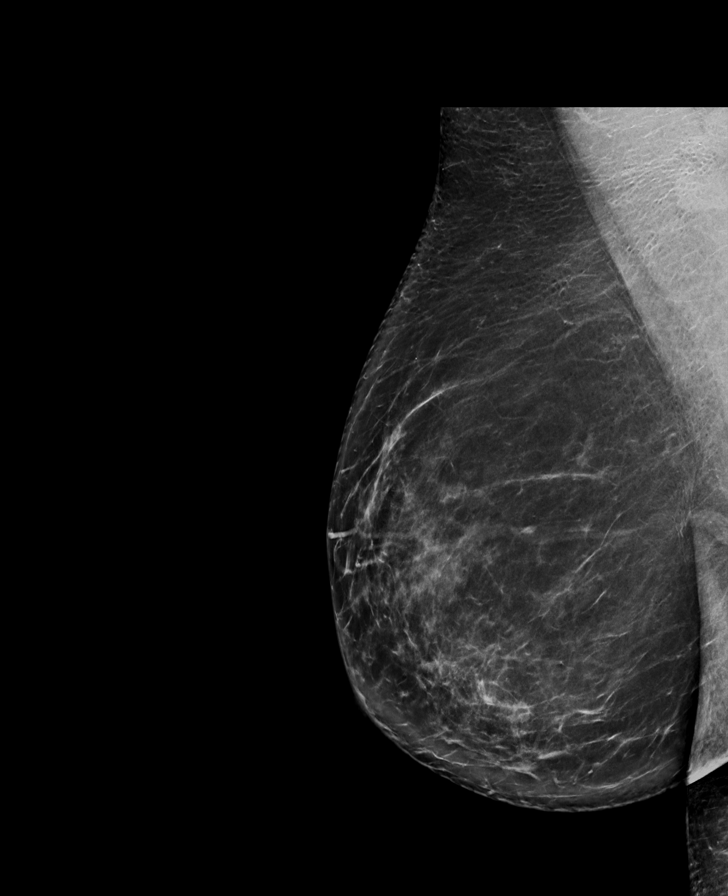

[L MLO tomo · tomo slice 47/92.0]
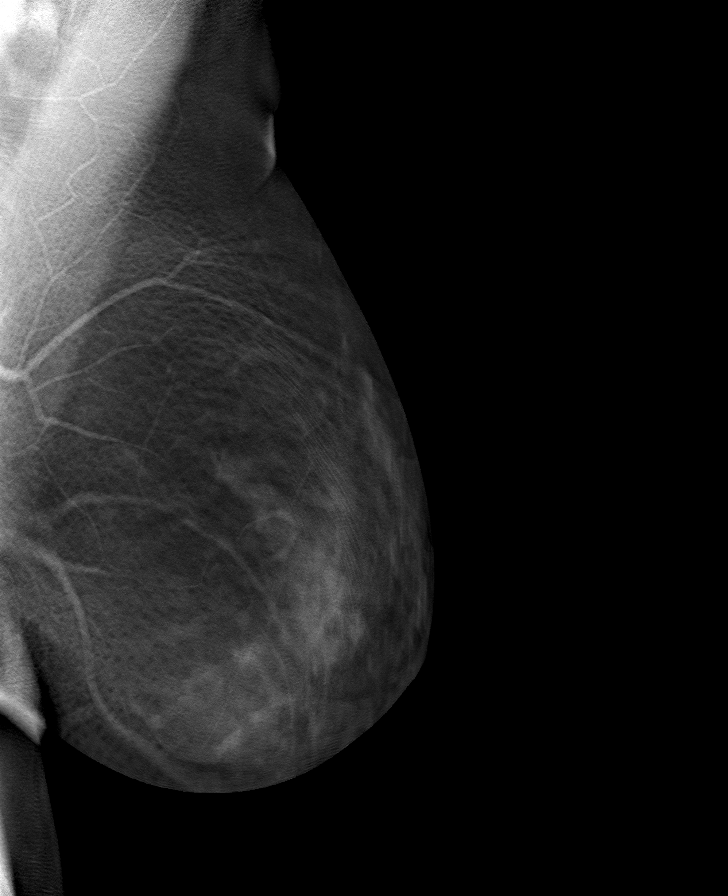

[L CC tomo · tomo slice 43/84.0]
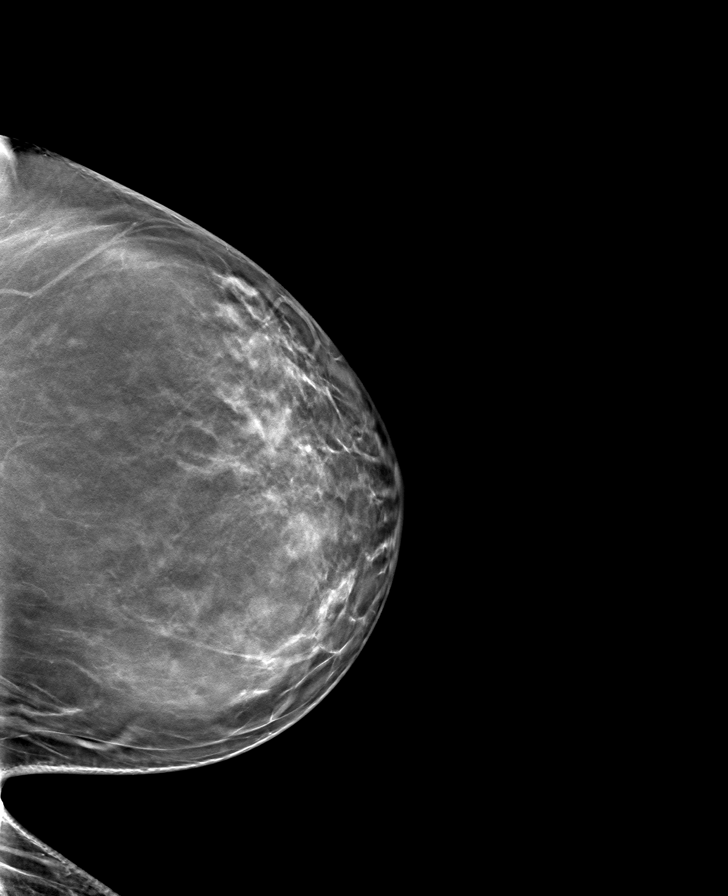

[R MLO tomo · tomo slice 47/94.0]
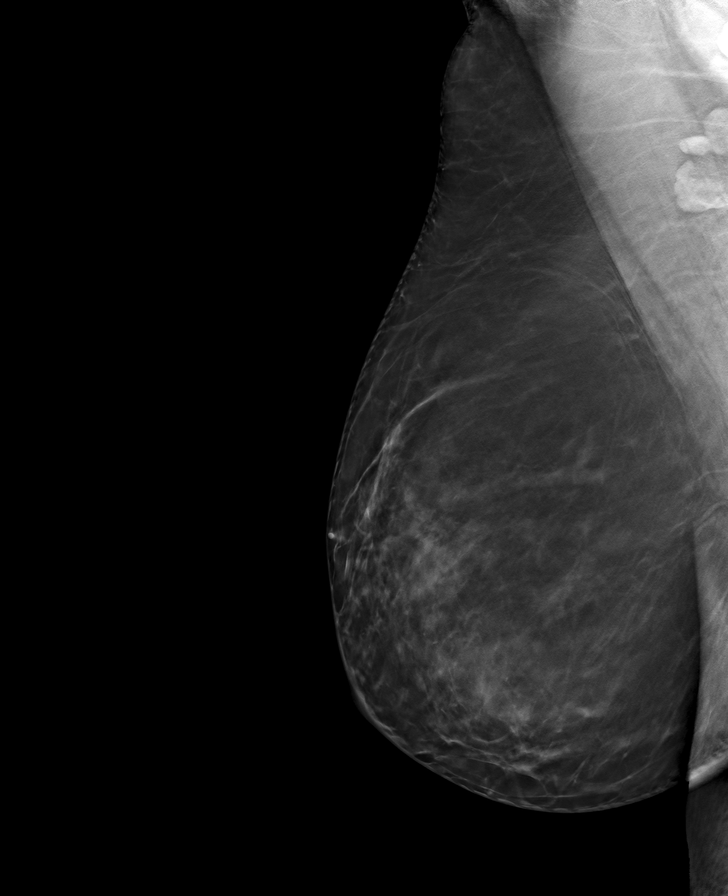

[R CC tomo · tomo slice 41/81.0]
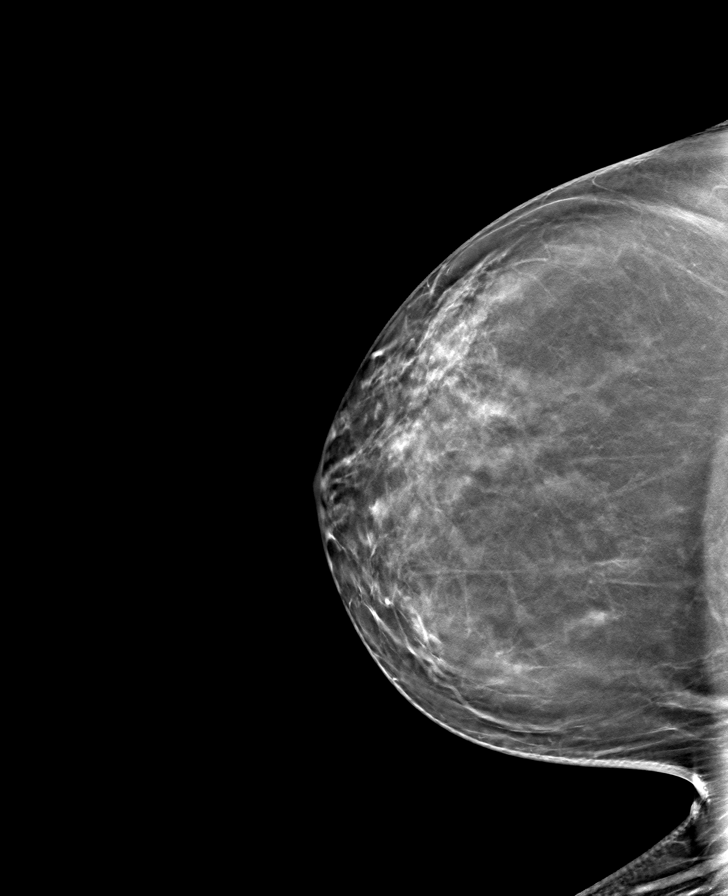

[8 of 24 positions shown; findings below may reference images not displayed]

ACR Breast Density Category b: There are scattered areas of
fibroglandular density.
FINDINGS: There are no findings suspicious for malignancy. Images were
processed with CAD.
IMPRESSION: No mammographic evidence of malignancy. A result letter of this
screening mammogram will be mailed directly to the patient.

RECOMMENDATION:
Screening mammogram in one year. (Code:[TQ])

BI-RADS CATEGORY  1: Negative.

## 2019-11-11 NOTE — Telephone Encounter (Signed)
Virginia Douglas (Key: [id number]) Rx #[id number] Reyvow 100MG  tablets   Form [employer] Electronic PA Form (NCPDP) Created 18 days ago Sent to Plan 15 days ago Plan Response 15 days ago Submit Clinical Questions 15 days ago Determination Unfavorable 15 days ago Prior authorization for Reyvow has been denied. RETURN TO DASHBOARD You may complete an appeal and request a re-evaluation of the coverage determination for this patient. Information about how to complete an appeal will be sent to you shortly. If you would like to start an appeal, please call CoverMyMeds at [phone].  Message from plan: Your PA request has been denied. Additional information will be provided in the denial communication. (Message 1140) Your PA request has been denied. Additional information will be provided in the denial communication. (Message 1140)

## 2019-11-11 NOTE — Telephone Encounter (Addendum)
Virginia Douglas ([id number]) Reyvow 100MG  tablets   Appeal denied- sent paper to Dr. Tomi Likens to see what he would like to do.

## 2019-11-20 ENCOUNTER — Other Ambulatory Visit: Payer: Self-pay | Admitting: Neurology

## 2019-12-01 ENCOUNTER — Other Ambulatory Visit: Payer: Self-pay | Admitting: Nurse Practitioner

## 2019-12-01 DIAGNOSIS — E119 Type 2 diabetes mellitus without complications: Secondary | ICD-10-CM

## 2019-12-01 NOTE — Telephone Encounter (Signed)
Contrave refill

## 2019-12-09 ENCOUNTER — Other Ambulatory Visit: Payer: Self-pay | Admitting: Neurology

## 2019-12-09 ENCOUNTER — Telehealth: Payer: Self-pay | Admitting: Neurology

## 2019-12-09 NOTE — Telephone Encounter (Signed)
Patient's insurance denied Reyvow appeal because she has only tried two triptans (sumatriptan and rizatriptan).  She needs to have tried and failed 3 triptans.  I will start her on eletriptan 40mg .

## 2019-12-10 ENCOUNTER — Other Ambulatory Visit: Payer: Self-pay

## 2019-12-10 DIAGNOSIS — M5430 Sciatica, unspecified side: Secondary | ICD-10-CM | POA: Insufficient documentation

## 2019-12-10 MED ORDER — ELETRIPTAN HYDROBROMIDE 40 MG PO TABS: 40.0000 mg | ORAL_TABLET | ORAL | 3 refills | Status: DC | PRN

## 2019-12-10 NOTE — Telephone Encounter (Signed)
This encounter was created in error - please disregard.

## 2019-12-18 ENCOUNTER — Other Ambulatory Visit: Payer: Self-pay

## 2019-12-18 ENCOUNTER — Ambulatory Visit (INDEPENDENT_AMBULATORY_CARE_PROVIDER_SITE_OTHER): Payer: 59 | Admitting: Nurse Practitioner

## 2019-12-18 ENCOUNTER — Encounter: Payer: Self-pay | Admitting: Nurse Practitioner

## 2019-12-18 VITALS — BP 112/80 | HR 84 | Temp 98.0°F | Ht 65.2 in | Wt 201.2 lb

## 2019-12-18 DIAGNOSIS — E119 Type 2 diabetes mellitus without complications: Secondary | ICD-10-CM | POA: Diagnosis not present

## 2019-12-18 DIAGNOSIS — Z6833 Body mass index (BMI) 33.0-33.9, adult: Secondary | ICD-10-CM

## 2019-12-18 DIAGNOSIS — E669 Obesity, unspecified: Secondary | ICD-10-CM

## 2019-12-18 DIAGNOSIS — M79644 Pain in right finger(s): Secondary | ICD-10-CM

## 2019-12-18 MED ORDER — CONTRAVE 8-90 MG PO TB12: 2.0000 | ORAL_TABLET | Freq: Two times a day (BID) | ORAL | 1 refills | Status: DC

## 2019-12-18 MED ORDER — CEPHALEXIN 500 MG PO CAPS: 500.0000 mg | ORAL_CAPSULE | Freq: Four times a day (QID) | ORAL | 0 refills | Status: AC

## 2019-12-18 NOTE — Patient Instructions (Signed)
   Increase your physical activity to at least 45 minutes 3-4 times a week  Take 2 tablets of contrave two times a day

## 2019-12-18 NOTE — Progress Notes (Signed)
Subjective:     Patient ID: Virginia Douglas , female    DOB: 1975/11/14 , 44 y.o.   MRN: [id number]   Chief Complaint  Patient presents with  . Weight Check    HPI  Here today for weight check - she is taking contrave 2 tablets once a day but does not feel like has been effective.    Wt Readings from Last 3 Encounters: 12/18/19 : 201 lb 3.2 oz (91.3 kg) 10/16/19 : 205 lb 3.2 oz (93.1 kg) 08/26/19 : 189 lb (85.7 kg)  She has been walking around the building at work during lunch.  She will have episodes of not eating enough then at times will overeat.    She had the J&J vaccine on March 21st.   Hand Pain  The incident occurred 2 days ago. There was no injury mechanism. Pain location: right middle finger. The quality of the pain is described as aching. The pain is at a severity of 10/10. The pain has been constant since the incident.   Past Medical History:  Diagnosis Date  . Anemia    iron deficiency  . Diabetes ([place])   . Edema   . Migraine headache      Family History  Problem Relation Age of Onset  . Diabetes Mother   . Hypertension Mother   . Diabetes Father   . Hypertension Father   . Kidney failure Father   . Hypertension Brother   . Diabetes Maternal Grandmother   . Breast cancer Maternal Grandmother   . Diabetes Paternal Grandmother   . Breast cancer Paternal Grandmother   . Cancer Maternal Aunt        breast  . Breast cancer Maternal Aunt      Current Outpatient Medications:  .  acetaZOLAMIDE (DIAMOX) 250 MG tablet, TAKE 1 TABLET BY MOUTH TWICE A DAY, Disp: 180 tablet, Rfl: 1 .  AIMOVIG 140 MG/ML SOAJ, INJECT 140 MG INTO THE SKIN EVERY 30 DAYS., Disp: 1 pen, Rfl: 3 .  atorvastatin (LIPITOR) 10 MG tablet, Take 1 tablet (10 mg total) by mouth daily., Disp: 90 tablet, Rfl: 1 .  benzonatate (TESSALON) 200 MG capsule, Take 1 capsule (200 mg total) by mouth 2 (two) times daily as needed for cough., Disp: 20 capsule, Rfl: 0 .  busPIRone (BUSPAR) 5 MG  tablet, TAKE 1 TABLET BY MOUTH TWICE A DAY, Disp: 180 tablet, Rfl: 1 .  clonazePAM (KLONOPIN) 0.5 MG tablet, TAKE 1 TABLET BY MOUTH 3 TIMES A DAY AS NEEDED FOR ANXIETY, Disp: 30 tablet, Rfl: 1 .  CONTRAVE 8-90 MG TB12, START 1 TABLET EVERY MORNING FOR 7 DAYS, THEN 1 TABLET TWICE DAILY FOR 7 DAYS, THEN 2 TABLETS EVERY MORNING AND ONE IN THE EVENING, Disp: 70 tablet, Rfl: 0 .  eletriptan (RELPAX) 40 MG tablet, Take 1 tablet (40 mg total) by mouth as needed for migraine or headache. May repeat in 2 hours if headache persists or recurs., Disp: 10 tablet, Rfl: 3 .  Insulin Pen Needle (PEN NEEDLES) 32G X 4 MM MISC, 1 each by Does not apply route once a week., Disp: 30 each, Rfl: 3 .  Lifitegrast (XIIDRA) 5 % SOLN, Place 1 drop into both eyes daily., Disp: , Rfl:  .  lisinopril-hydrochlorothiazide (ZESTORETIC) 10-12.5 MG tablet, Take 1 tablet by mouth daily., Disp: 90 tablet, Rfl: 1 .  OZEMPIC, 1 MG/DOSE, 2 MG/1.5ML SOPN, INJECT 1 MG SUBCUTANEOUSLY EVERY WEEK ON THE SAME DAY IN ABDOMEN, THIGH, OR UPPER ARM (  ROTATE), Disp: 9 pen, Rfl: 3 .  pregabalin (LYRICA) 100 MG capsule, Take 1 capsule (100 mg total) by mouth 3 (three) times daily., Disp: 90 capsule, Rfl: 6 .  rizatriptan (MAXALT-MLT) 10 MG disintegrating tablet, Take 10 mg by mouth as needed for migraine. Take 1 tablet earliest onset of migraine.  May repeat x1 in 2 hours if needed.  Maximum 2 tablets in 24h, Disp: , Rfl:  .  TURMERIC PO, Take 1 capsule by mouth daily., Disp: , Rfl:  .  Vitamin D, Ergocalciferol, (DRISDOL) 1.25 MG (50000 UNIT) CAPS capsule, Take 1 capsule (50,000 Units total) by mouth every 7 (seven) days., Disp: 12 capsule, Rfl: 0 .  Blood Glucose Monitoring Suppl (ONETOUCH VERIO IQ SYSTEM) w/Device KIT, by Does not apply route. Use to check blood sugars twice, Disp: , Rfl:  .  glucose blood (ONETOUCH VERIO) test strip, 1 each by Other route as needed for other. Insert 1 by subcutaneous route 2 times every day check blood sugar before  breakfast and dinner, Disp: , Rfl:  .  Lasmiditan Succinate (REYVOW) 100 MG TABS, Take 1 tablet by mouth daily as needed. (Patient not taking: Reported on 12/18/2019), Disp: 16 tablet, Rfl: 3   No Known Allergies   Review of Systems  Constitutional: Negative.   Respiratory: Negative.   Cardiovascular: Negative.   Gastrointestinal: Negative.   Endocrine: Negative.   Skin:       Right 2nd finger lateral cutical with pain for the last 2 days  Neurological: Negative.   Psychiatric/Behavioral: Negative.      Today's Vitals   12/18/19 0919  BP: 112/80  Pulse: 84  Temp: 98 F (36.7 C)  TempSrc: Oral  Weight: 201 lb 3.2 oz (91.3 kg)  Height: 5' 5.2" (1.656 m)  PainSc: 10-Worst pain ever  PainLoc: Finger   Body mass index is 33.28 kg/m.   Objective:  Physical Exam Vitals reviewed.  Constitutional:      General: She is not in acute distress.    Appearance: Normal appearance. She is well-developed. She is obese.  HENT:     Head: Normocephalic and atraumatic.  Eyes:     Pupils: Pupils are equal, round, and reactive to light.  Cardiovascular:     Rate and Rhythm: Normal rate and regular rhythm.     Pulses: Normal pulses.     Heart sounds: Normal heart sounds. No murmur.  Pulmonary:     Effort: Pulmonary effort is normal.     Breath sounds: Normal breath sounds.  Musculoskeletal:        General: No tenderness. Normal range of motion.  Skin:    General: Skin is warm and dry.     Capillary Refill: Capillary refill takes less than 2 seconds.  Neurological:     General: No focal deficit present.     Mental Status: She is alert and oriented to person, place, and time.     Cranial Nerves: No cranial nerve deficit.  Psychiatric:        Mood and Affect: Mood normal.        Behavior: Behavior normal.        Thought Content: Thought content normal.        Judgment: Judgment normal.         Assessment And Plan:     1. Obesity (BMI 30-39.9)  Chronic  Discussed healthy  diet and regular exercise options   Encouraged to exercise at least 150 minutes per week with 2 days of  strength training  She has lost 4 lbs since her last office visit but she is not at maximum dose. She is to take 2 tablets of contrave twice a day and return to office in 2 months  2. Pain in right finger(s)  Right medial finger cuticle area and has tenderness when touch  Paronychia vs cellulitis  Explained to avoid having the nail tech to "dig" into her cuticles - cephALEXin (KEFLEX) 500 MG capsule; Take 1 capsule (500 mg total) by mouth 4 (four) times daily for 10 days.  Dispense: 40 capsule; Refill: 0  3. Type 2 diabetes mellitus without complication, without long-term current use of insulin (HCC)  Chronic  Stable  Advised to monitor her blood sugars in the event she has an infection to her finger        [name], FNP    THE PATIENT IS ENCOURAGED TO PRACTICE SOCIAL DISTANCING DUE TO THE COVID-19 PANDEMIC.

## 2019-12-29 ENCOUNTER — Other Ambulatory Visit: Payer: Self-pay

## 2019-12-29 MED ORDER — FLUCONAZOLE 150 MG PO TABS: ORAL_TABLET | ORAL | 0 refills | Status: DC

## 2019-12-30 ENCOUNTER — Encounter: Payer: Self-pay | Admitting: Neurology

## 2019-12-30 NOTE — Progress Notes (Signed)
Virtual Visit via Video Note The purpose of this virtual visit is to provide medical care while limiting exposure to the novel coronavirus.    Consent was obtained for video visit:  Yes.   Answered questions that patient had about telehealth interaction:  Yes.   I discussed the limitations, risks, security and privacy concerns of performing an evaluation and management service by telemedicine. I also discussed with the patient that there may be a patient responsible charge related to this service. The patient expressed understanding and agreed to proceed.  Pt location: Home Physician Location: office Name of referring provider:  [name], FNP I connected with Virginia Douglas at patients initiation/request on 12/31/2019 at  8:50 AM EDT by video enabled telemedicine application and verified that I am speaking with the correct person using two identifiers. [name]:  [id number] Pt DOB:  [birth year] Video Participants:  Virginia Douglas   History of Present Illness:  Virginia Douglas a 44 year old right-handed woman with diabetes and hypertension who follows up for migrainesand lumbar radiculopathy.  UPDATE: Insurance denied Reyvow because they required failing a third triptan. She was started on eletriptan.  She hasn't been to work since Monday for migraine. Intensity:  severe Duration:  3 days.  Works within 20 minutes with eletriptan but returns.  Does not break with second dose.   Frequency:  Once a week Current NSAIDS:none Current analgesics:none Other current abortive: none Current triptans:eletriptan 31m Current ergotamine:None Current anti-emetic:Zofran 4 mg Current muscle relaxants:Robaxin Current anti-anxiolytic:alprazolam Current sleep aide:none Current Antihypertensive medications:Lisinopril-HCTZ Current Antidepressant medications:Citalopram 291mCurrent Anticonvulsant medications:Topiramate 100 mg at bedtime, acetazolamide 25065mtwice daily, Lyrica 100m62mree times daily. Current anti-CGRP:Aimovig140mg43mrent Vitamins/Herbal/Supplements:turmeric Current Antihistamines/Decongestants:none Other therapy:none Hormone/birth control:no  Caffeine:One soda a week Diet:Hydrates Exercise:Yes Depression:Yes; Anxiety:Yes Other pain:No Sleep hygiene:Varies  HISTORY: Onset:Remote history of migraines. Controlled for many years. Returned in [id number] Kitchention:Right sided head/ear radiating down right side of neck. Does not radiate down the right arm. Quality:Pounding in head, burning in neck Initial intensity:Severe.Shedenies thunderclap headache or severe headache that wakes herfrom sleep. Aura:no Prodrome:no Postdrome:no Associated symptoms: Nausea, vomiting, photophobia, phonophobia, blurred vision. She denies associated unilateral numbness or weakness. Initial duration:1 day InitialFrequency:2 days a week InitialFrequency of abortive medication:2 days a week Triggers: Emotional stress Relieving factors: Heating pad, Robaxin Activity:Aggravates  She was evaluated in the ED on 02/10/18, where CTA of head and neck was performed and personally reviewed. It demonstrated empty sella but otherwise unremarkable for mass lesion, aneurysm or dissection. She was diagnosed with iidiopathic intracranial hypertension diagnosed in 2019.She is on Diamox 250mg 6my.She is followed by ophthalmology.Exam note from 10/10/18 stated "maybe slight nasal nerve elevation" but no definite signs of papilledema.  She reports left sided low back pain radiating down the side of the left leg with associated numbness in the side of leg and footsince early 2020She had a lumbar X-ray which demonstrated mild dextroscoliosis apex L4. No injury. Gabapentin previously used was ineffective for neuropathic pain.   Past NSAIDS:Ibuprofen, naproxen Past analgesics: tramadol 50mg P82m abortive triptans:Sumatriptan 100 mg, rizatriptan 10mg Pa59muscle relaxants: Flexeril Past anti-emetic:no Past antihypertensive medications:no Past antidepressant medications: nortriptyline 50mg(cau25mnausea/vomiting, problems sleeping) Past anticonvulsant medications: gabapentin 300mg twic64mily(for neuropathic pain) Past vitamins/Herbal/Supplements:no Past antihistamines/decongestants:no Other past therapies:no  Family history of headache:no  MRI of cervical spine from 09/22/15 was personally reviewed and was unremarkable.  Past Medical History: Past Medical History:  Diagnosis Date  . Anemia    iron deficiency  . Diabetes (  HCC)   . Edema   . Migraine headache     Medications: Outpatient Encounter Medications as of 12/31/2019  Medication Sig Note  . acetaZOLAMIDE (DIAMOX) 250 MG tablet TAKE 1 TABLET BY MOUTH TWICE A DAY   . AIMOVIG 140 MG/ML SOAJ INJECT 140 MG INTO THE SKIN EVERY 30 DAYS.   Marland Kitchen atorvastatin (LIPITOR) 10 MG tablet Take 1 tablet (10 mg total) by mouth daily.   . benzonatate (TESSALON) 200 MG capsule Take 1 capsule (200 mg total) by mouth 2 (two) times daily as needed for cough.   . Blood Glucose Monitoring Suppl (ONETOUCH VERIO IQ SYSTEM) w/Device KIT by Does not apply route. Use to check blood sugars twice   . busPIRone (BUSPAR) 5 MG tablet TAKE 1 TABLET BY MOUTH TWICE A DAY   . clonazePAM (KLONOPIN) 0.5 MG tablet TAKE 1 TABLET BY MOUTH 3 TIMES A DAY AS NEEDED FOR ANXIETY   . eletriptan (RELPAX) 40 MG tablet Take 1 tablet (40 mg total) by mouth as needed for migraine or headache. May repeat in 2 hours if headache persists or recurs.   . fluconazole (DIFLUCAN) 150 MG tablet Take one tablet at the onset of symptoms and repeat in 5 days   . glucose blood (ONETOUCH VERIO) test strip 1 each by Other route as needed for other. Insert 1 by subcutaneous route 2 times every day check blood sugar before breakfast and dinner   . Insulin Pen Needle (PEN  NEEDLES) 32G X 4 MM MISC 1 each by Does not apply route once a week.   [place] Succinate (REYVOW) 100 MG TABS Take 1 tablet by mouth daily as needed. (Patient not taking: Reported on 12/18/2019)   . Lifitegrast (XIIDRA) 5 % SOLN Place 1 drop into both eyes daily.   Marland Kitchen lisinopril-hydrochlorothiazide (ZESTORETIC) 10-12.5 MG tablet Take 1 tablet by mouth daily.   . Naltrexone-buPROPion HCl ER (CONTRAVE) 8-90 MG TB12 Take 2 tablets by mouth in the morning and at bedtime.   Marland Kitchen OZEMPIC, 1 MG/DOSE, 2 MG/1.5ML SOPN INJECT 1 MG SUBCUTANEOUSLY EVERY WEEK ON THE SAME DAY IN ABDOMEN, THIGH, OR UPPER ARM (ROTATE)   . pregabalin (LYRICA) 100 MG capsule Take 1 capsule (100 mg total) by mouth 3 (three) times daily.   . rizatriptan (MAXALT-MLT) 10 MG disintegrating tablet Take 10 mg by mouth as needed for migraine. Take 1 tablet earliest onset of migraine.  May repeat x1 in 2 hours if needed.  Maximum 2 tablets in 24h 08/26/2019: Pt is taken  . TURMERIC PO Take 1 capsule by mouth daily.   . Vitamin D, Ergocalciferol, (DRISDOL) 1.25 MG (50000 UNIT) CAPS capsule Take 1 capsule (50,000 Units total) by mouth every 7 (seven) days.   . [DISCONTINUED] metFORMIN (GLUCOPHAGE-XR) 500 MG 24 hr tablet Take 2 tablets (1,000 mg total) by mouth 2 (two) times daily. (Patient not taking: Reported on 06/05/2019)   . [DISCONTINUED] Phentermine-Topiramate 11.25-69 MG CP24 Take 1 tablet by mouth daily.    No facility-administered encounter medications on file as of 12/31/2019.    Allergies: No Known Allergies  Family History: Family History  Problem Relation Age of Onset  . Diabetes Mother   . Hypertension Mother   . Diabetes Father   . Hypertension Father   . Kidney failure Father   . Hypertension Brother   . Diabetes Maternal Grandmother   . Breast cancer Maternal Grandmother   . Diabetes Paternal Grandmother   . Breast cancer Paternal Grandmother   . Cancer  Maternal Aunt        breast  . Breast cancer Maternal Aunt       Social History: Social History   Socioeconomic History  . Marital status: Legally Separated    Spouse name: James  . Number of children: 2  . Years of education: 16  . Highest education level: Some college, no degree  Occupational History  . Occupation: Administrative assistant    Employer: [employer]  Tobacco Use  . Smoking status: Former Smoker    Packs/day: 0.50    Years: 25.00    Pack years: 12.50    Types: Cigarettes    Start date: 03/24/2019  . Smokeless tobacco: Never Used  Substance and Sexual Activity  . Alcohol use: No    Alcohol/week: 0.0 standard drinks  . Drug use: No  . Sexual activity: Yes    Birth control/protection: Surgical  Other Topics Concern  . Not on file  Social History Narrative   In relationship, inspector in manufacturing facility, 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 She lives with her 2 children in a one story house. She drinks one large cup of coffee a day and an occasional tea or soda. She walks daily.      Social Determinants of Health   Financial Resource Strain:   . Difficulty of Paying Living Expenses:   Food Insecurity:   . Worried About Running Out of Food in the Last Year:   . Ran Out of Food in the Last Year:   Transportation Needs:   . Lack of Transportation (Medical):   . Lack of Transportation (Non-Medical):   Physical Activity:   . Days of Exercise per Week:   . Minutes of Exercise per Session:   Stress:   . Feeling of Stress :   Social Connections:   . Frequency of Communication with Friends and Family:   . Frequency of Social Gatherings with Friends and Family:   . Attends Religious Services:   . Active Member of Clubs or Organizations:   . Attends Club or Organization Meetings:   . Marital Status:   Intimate Partner Violence:   . Fear of Current or Ex-Partner:   . Emotionally Abused:   .  Physically Abused:   . Sexually Abused:     Observations/Objective:   Height 5' 6.5" (1.689 m), weight 191 lb (86.6 kg). No acute distress.  Alert and oriented.  Speech fluent and not dysarthric.  Language intact.  Eyes orthophoric on primary gaze.  Face symmetric.  Assessment and Plan:   1.  Migraine without aura, without status migrainosus, not intractable 2.  History of idiopathic intracranial hypertension, stable  1.  For preventative management, we will increase topiramate to 125mg at bedtime.  We can increase dose to 150mg in 4 weeks if needed.  Continue Aimovig 140mg.  If headaches not improved by follow up, plan will be to switch from Aimovig to Emgality. 2.  For abortive therapy, will again prescribe Reyvow.  She has now failed sumatriptan, rizatriptan, eletriptan and OTC NSAIDS and analgesics.  Reyvow was previously effective.   3.  Limit use of pain relievers to no more than 2 days out of week to prevent risk of rebound or medication-overuse headache. 4.  Keep headache diary 5.  Exercise, hydration, caffeine cessation, sleep hygiene, monitor for and avoid triggers 6.   Follow up 4 months.   Follow Up Instructions:    -I discussed the assessment and treatment plan with the patient. The patient was provided an opportunity to ask questions and all were answered. The patient agreed with the plan and demonstrated an understanding of the instructions.   The patient was advised to call back or seek an in-person evaluation if the symptoms worsen or if the condition fails to improve as anticipated.   Adam Robert Jaffe, DO  

## 2019-12-31 ENCOUNTER — Telehealth: Payer: Self-pay | Admitting: Neurology

## 2019-12-31 ENCOUNTER — Other Ambulatory Visit: Payer: Self-pay | Admitting: Neurology

## 2019-12-31 ENCOUNTER — Telehealth (INDEPENDENT_AMBULATORY_CARE_PROVIDER_SITE_OTHER): Payer: 59 | Admitting: Neurology

## 2019-12-31 ENCOUNTER — Other Ambulatory Visit: Payer: Self-pay

## 2019-12-31 VITALS — Ht 66.5 in | Wt 191.0 lb

## 2019-12-31 DIAGNOSIS — G43719 Chronic migraine without aura, intractable, without status migrainosus: Secondary | ICD-10-CM | POA: Diagnosis not present

## 2019-12-31 MED ORDER — REYVOW 100 MG PO TABS: 1.0000 | ORAL_TABLET | Freq: Every day | ORAL | 11 refills | Status: DC | PRN

## 2019-12-31 MED ORDER — TOPIRAMATE 50 MG PO TABS
125.0000 mg | ORAL_TABLET | Freq: Every day | ORAL | 3 refills | Status: DC
Start: 2019-12-31 — End: 2020-03-29

## 2019-12-31 MED ORDER — REYVOW 100 MG PO TABS: 1.0000 | ORAL_TABLET | Freq: Every day | ORAL | 3 refills | Status: DC | PRN

## 2019-12-31 MED ORDER — REYVOW 100 MG PO TABS: 1.0000 | ORAL_TABLET | Freq: Every day | ORAL | 5 refills | Status: DC | PRN

## 2019-12-31 NOTE — Telephone Encounter (Signed)
Aware that it needs prior authorization.  She has met her insurance's criteria for the medication.

## 2019-12-31 NOTE — Telephone Encounter (Signed)
Mickel Baas from [facility] @ [phone] called and states that the Gerre Scull is not covered by insurance and needs a prior auth. It was also for 11 refills this is a controlled substance and can only have 5 refills

## 2019-12-31 NOTE — Progress Notes (Signed)
SAVANNA HANDLIN [id number][facility] [id number] Reyvow 50MG  tablets Status: PA Request  Created: April 28th, 2021  Sent: April 28th, 2021

## 2019-12-31 NOTE — Telephone Encounter (Signed)
PA started

## 2020-01-02 NOTE — Progress Notes (Signed)
Virginia Douglas (Key: [id number])  This request has received an Unfavorable outcome. Contact [employer] at [phone] to initiate an Appeal.  Notice of Adverse Determination Date: 10/27/2019 ADAM JAFFE [place] [address] [place], [place] [address] Plan Member Name: Virginia Douglas Plan Member ID: [id number] Plan Name: [place] Prescriber Name: ADAM JAFFE Prescriber Phone: [phone] Prescriber Fax: [phone] [name]: [facility] received a request for coverage of Reyvow 100mg  for you. Your plan has criteria in place for coverage of this medicine. We needed additional clinical information from your prescriber in order to make a decision to either approve or deny the request. We did not receive the additional clinical information within the time allowed to make the decision; therefore, the request was denied. This is the initial adverse determination for this request. The request was denied because: You do not meet the requirements of your plan. Your plan approved VF Formulary Exception (VF Standard) criteria covers this drug when your doctor provides documentation that you meet one of these conditions: - You have a clinical condition for which there is no appropriate formulary alternative - You need a form of the drug that is not on the formulary - You tried the required number of preferred formulary alternatives on your formulary first. These drugs did not work for you or you cannot take them. Your request has been denied based on the information we have. If you still want to ask for coverage for this medicine, you or your prescriber may submit another request, along with the necessary clinical information, to [facility] by mail, phone or fax to: [facility] Prior Authorization (Commercial) [address]This document contains references to brand-name prescription drugs that are trademarks or registered trademarks of [employer]/film/video not  affiliated with [employer]. Your privacy is important to [place]. Our employees are trained regarding the appropriate way to handle your private health information. [id number] TDD/TTY: [place] [address] Phone: [phone] Fax: [phone] You may ask for a free copy of the actual benefit provision, guideline, protocol or other similar criterion used to make the decision and any other information related to this decision by calling Customer Care toll-free at the number on your benefit ID card. You may also choose to purchase this medicine at your own expense. For more information about your prescription benefit, please refer to your benefit plan materials. If you disagree with this decision, you may ask for an appeal. Please mail or fax your appeal to: Prescription Claim [place] [address] Fax: [phone] If your situation is urgent as defined by law, you may ask for an expedited appeal. Urgent requests must be clearly identified as urgent when submitted. Important information about your appeal rights and directions about how to ask for an appeal are provided with this letter. If your prescriber would like to discuss this decision with a clinical reviewer at [facility], your prescriber can call [facility], and we will arrange to make someone available to speak with your prescriber. If you belong to a group plan that is subject to the [place] (ERISA), you may also have the right to bring a civil action under ERISA Section 502(a). If your plan has informed us of its time limit for bringing a civil action under ERISA, the time limit is listed below. If nothing is listed, please refer to your benefit plan materials or contact your plan administrator for more information  on time limits for bringing a civil action. If you have questions, please call Customer Care toll-free  at the number on your benefit ID card or in your benefit plan materials. Sincerely

## 2020-01-02 NOTE — Progress Notes (Signed)
Received approval via fax from [facility]. Valid 01/01/2020-12/31/2020 Sent approval fax to scan into her chart

## 2020-01-03 ENCOUNTER — Other Ambulatory Visit: Payer: Self-pay | Admitting: Nurse Practitioner

## 2020-01-03 DIAGNOSIS — E119 Type 2 diabetes mellitus without complications: Secondary | ICD-10-CM

## 2020-01-07 ENCOUNTER — Encounter: Payer: Self-pay | Admitting: Nurse Practitioner

## 2020-01-08 ENCOUNTER — Encounter (HOSPITAL_COMMUNITY): Payer: Self-pay

## 2020-01-08 ENCOUNTER — Ambulatory Visit (HOSPITAL_COMMUNITY)
Admission: EM | Admit: 2020-01-08 | Discharge: 2020-01-08 | Disposition: A | Discharge status: Home / Self Care | Payer: 59

## 2020-01-08 ENCOUNTER — Other Ambulatory Visit: Payer: Self-pay

## 2020-01-08 ENCOUNTER — Ambulatory Visit (INDEPENDENT_AMBULATORY_CARE_PROVIDER_SITE_OTHER): Payer: 59 | Admitting: Internal Medicine

## 2020-01-08 DIAGNOSIS — L03019 Cellulitis of unspecified finger: Secondary | ICD-10-CM

## 2020-01-08 DIAGNOSIS — E119 Type 2 diabetes mellitus without complications: Secondary | ICD-10-CM | POA: Diagnosis not present

## 2020-01-08 DIAGNOSIS — L03011 Cellulitis of right finger: Secondary | ICD-10-CM

## 2020-01-08 MED ORDER — LIDOCAINE HCL (PF) 2 % IJ SOLN
INTRAMUSCULAR | Status: AC
  Filled 2020-01-08: qty 5

## 2020-01-08 NOTE — ED Provider Notes (Signed)
[place]    CSN: [id number] Arrival date & time: 01/08/20  1129      History   Chief Complaint Chief Complaint  Patient presents with  . Hand Pain    HPI Virginia Douglas is a 44 y.o. female.   HPI  Patient presents for evaluation of right middle finger swelling x1 month.  Patient was treated for paronychia infection by her PCP with 7 days of Keflex.  Patient reports swelling of middle finger has only gotten worse and increasingly tender.  Patient suffers from type 2 diabetes although it is well controlled with an A1c of 5.8 approximately 5 months ago.  She grew concerned as the skin in the medial lateral aspect of the right digit has become discolored.  She denies any known injury to finger or damage to her artificial nail.  The pain is most on the posterior DIP region of the right middle finger. The affected area is erythematous and warm to touch. Patient is afebrile. Past Medical History:  Diagnosis Date  . Anemia    iron deficiency  . Diabetes ([place])   . Edema   . Migraine headache     Patient Active Problem List   Diagnosis Date Noted  . Sciatic nerve pain 12/10/2019  . Acute pain of left shoulder 06/05/2019  . Grief 06/05/2019  . Migraine 06/05/2019  . Type 2 diabetes mellitus without complication, without long-term current use of insulin ([place]) 04/07/2019  . Depression 04/07/2019  . Obesity due to excess calories without serious comorbidity 04/07/2019  . Left leg pain 04/07/2019  . Anxiety 04/07/2019  . Headache 07/06/2018  . Diabetes ([place]) 12/08/2015  . Essential hypertension 12/08/2015  . Smoker 12/08/2015  . LLQ pain 12/08/2015  . Paresthesia 09/16/2015  . Urinary urgency 09/16/2015    Past Surgical History:  Procedure Laterality Date  . ABDOMINAL HYSTERECTOMY    . CHOLECYSTECTOMY      OB History   No obstetric history on file.      Home Medications    Prior to Admission medications   Medication Sig Start Date End Date  Taking? Authorizing Provider  acetaZOLAMIDE (DIAMOX) 250 MG tablet TAKE 1 TABLET BY MOUTH TWICE A DAY 08/26/19   Jaffe, Adam R, DO  AIMOVIG 140 MG/ML SOAJ INJECT 140 MG INTO THE SKIN EVERY 30 DAYS. 11/20/19   Tomi Likens, Adam R, DO  atorvastatin (LIPITOR) 10 MG tablet TAKE 1 TABLET BY MOUTH EVERY DAY 01/05/20   [name], FNP  benzonatate (TESSALON) 200 MG capsule Take 1 capsule (200 mg total) by mouth 2 (two) times daily as needed for cough. Patient not taking: Reported on 01/08/2020 08/15/19   Raylene Everts, MD  Blood Glucose Monitoring Suppl (ONETOUCH VERIO IQ SYSTEM) w/Device KIT by Does not apply route. Use to check blood sugars twice    [provider]  busPIRone (BUSPAR) 5 MG tablet TAKE 1 TABLET BY MOUTH TWICE A DAY 08/27/19   [name], FNP  clonazePAM (KLONOPIN) 0.5 MG tablet TAKE 1 TABLET BY MOUTH 3 TIMES A DAY AS NEEDED FOR ANXIETY 10/23/19   [name], FNP  glucose blood (ONETOUCH VERIO) test strip 1 each by Other route as needed for other. Insert 1 by subcutaneous route 2 times every day check blood sugar before breakfast and dinner    [provider]  Insulin Pen Needle (PEN NEEDLES) 32G X 4 MM MISC 1 each by Does not apply route once a week. 11/11/18   [name], [place]  Lasmiditan Succinate (REYVOW) 100 MG TABS Take 1 tablet by mouth daily as needed. 12/31/19   Tomi Likens, Adam R, DO  Lifitegrast (XIIDRA) 5 % SOLN Place 1 drop into both eyes daily.    [provider]  lisinopril-hydrochlorothiazide (ZESTORETIC) 10-12.5 MG tablet Take 1 tablet by mouth daily. 10/02/19   [name], FNP  Naltrexone-buPROPion HCl ER (CONTRAVE) 8-90 MG TB12 Take 2 tablets by mouth in the morning and at bedtime. 12/18/19   [name], FNP  OZEMPIC, 1 MG/DOSE, 2 MG/1.5ML SOPN INJECT 1 MG SUBCUTANEOUSLY EVERY WEEK ON THE SAME DAY IN ABDOMEN, THIGH, OR UPPER ARM (ROTATE) 01/06/19   [name], FNP  pregabalin (LYRICA) 100 MG capsule Take 1 capsule (100 mg total) by mouth 3  (three) times daily. 10/24/19   Pieter Partridge, DO  topiramate (TOPAMAX) 50 MG tablet Take 2.5 tablets (125 mg total) by mouth at bedtime. 12/31/19   Pieter Partridge, DO  TURMERIC PO Take 1 capsule by mouth daily.    [provider]  Vitamin D, Ergocalciferol, (DRISDOL) 1.25 MG (50000 UNIT) CAPS capsule Take 1 capsule (50,000 Units total) by mouth every 7 (seven) days. 10/21/19   [name], FNP  metFORMIN (GLUCOPHAGE-XR) 500 MG 24 hr tablet Take 2 tablets (1,000 mg total) by mouth 2 (two) times daily. Patient not taking: Reported on 06/05/2019 03/10/16 08/15/19  [name], MD  Phentermine-Topiramate 11.25-69 MG CP24 Take 1 tablet by mouth daily. 02/04/19 04/07/19  [name], FNP    Family History Family History  Problem Relation Age of Onset  . Diabetes Mother   . Hypertension Mother   . Diabetes Father   . Hypertension Father   . Kidney failure Father   . Hypertension Brother   . Diabetes Maternal Grandmother   . Breast cancer Maternal Grandmother   . Diabetes Paternal Grandmother   . Breast cancer Paternal Grandmother   . Cancer Maternal Aunt        breast  . Breast cancer Maternal Aunt     Social History Social History   Tobacco Use  . Smoking status: Former Smoker    Packs/day: 0.50    Years: 25.00    Pack years: 12.50    Types: Cigarettes    Start date: 03/24/2019  . Smokeless tobacco: Never Used  Substance Use Topics  . Alcohol use: No    Alcohol/week: 0.0 standard drinks  . Drug use: No     Allergies   Patient has no known allergies.   Review of Systems Review of Systems Pertinent negatives listed in HPI Physical Exam Triage Vital Signs ED Triage Vitals  Enc Vitals Group     BP 01/08/20 1216 107/66     Pulse Rate 01/08/20 1216 82     Resp 01/08/20 1216 18     Temp 01/08/20 1216 98.4 F (36.9 C)     Temp Source 01/08/20 1216 Oral     SpO2 01/08/20 1216 100 %     Weight 01/08/20 1218 199 lb (90.3 kg)     Height --      Head Circumference  --      Peak Flow --      Pain Score 01/08/20 1218 10     Pain Loc --      Pain Edu? --      Excl. in [place]? --    No data found.  Updated Vital Signs BP 107/66 (BP Location: Right Arm)   Pulse 82   Temp 98.4  F (36.9 C) (Oral)   Resp 18   Wt 199 lb (90.3 kg)   SpO2 100%   BMI 31.64 kg/m   Visual Acuity Right Eye Distance:   Left Eye Distance:   Bilateral Distance:    Right Eye Near:   Left Eye Near:    Bilateral Near:     Physical Exam  General appearance: alert, well developed, well nourished, cooperative and in no distress Head: Normocephalic, without obvious abnormality, atraumatic Respiratory: Respirations even and unlabored, normal respiratory rate Heart: rate and rhythm normal. No gallop or murmurs noted on exam  Extremities: Right middle digit diffuse edema, erythema finger pad at the DIP, palpable tenderness w/fluctuance finger pad, exterior nail bed ecchymotic discoloration present Skin: Skin color, texture, turgor normal. No rashes seen  Psych: Appropriate mood and affect.  UC Treatments / Results  Labs (all labs ordered are listed, but only abnormal results are displayed) Labs Reviewed - No data to display  EKG   Radiology No results found.  Procedures Procedures (including critical care time)  Medications Ordered in UC Medications - No data to display  Initial Impression / Assessment and Plan / UC Course  I have reviewed the triage vital signs and the nursing notes.  Pertinent labs & imaging results that were available during my care of the patient were reviewed by me and considered in my medical decision making (see chart for details).    Felon of finger right middle finger Consulted with Dr. Lanny Cramp who agreed patient warrants speciality evaluation.Patient was referred to [facility] to follow-up with hand specialist for evaluation of possible I&D of felon. She has failed resolution with one round of antibiotics prescribed by PCP. Will  defer any further treatment and management to hand specialist. Work note provided.  Final Clinical Impressions(s) / UC Diagnoses   Final diagnoses:  Felon of finger right middle finger     Discharge Instructions     Go to [facility] walk-in clinic for evaluations of possible felon middle right finger     ED Prescriptions    None     PDMP not reviewed this encounter.   Scot Jun, [place] 01/09/20 [phone]

## 2020-01-08 NOTE — Progress Notes (Signed)
This visit occurred during the SARS-CoV-2 public health emergency.  Safety protocols were in place, including screening questions prior to the visit, additional usage of staff PPE, and extensive cleaning of exam room while observing appropriate contact time as indicated for disinfecting solutions.  Subjective:     Patient ID: Virginia Douglas , female    DOB: Sep 29, 1975 , 44 y.o.   MRN: [id number]   Chief Complaint  Patient presents with  . FINGER PAIN    patient is f/u because her finger has gotten worse since she was last here her finger is really swollen and turning green she would like her finger to be wrapped so she dont hit it against anything  . med check    patient would like her dose of buspar and clonzepam to     HPI  Pt has been having swelling and pain of her medial distal R middle finger x 1 month. Has been on antibiotics for this but she is not better. She is not getting better. Had her nails done last week and that person did not see a hang nail. She woke up one day with swelling and tenderness and denies an injury to this area.   Past Medical History:  Diagnosis Date  . Anemia    iron deficiency  . Diabetes ([place])   . Edema   . Migraine headache      Family History  Problem Relation Age of Onset  . Diabetes Mother   . Hypertension Mother   . Diabetes Father   . Hypertension Father   . Kidney failure Father   . Hypertension Brother   . Diabetes Maternal Grandmother   . Breast cancer Maternal Grandmother   . Diabetes Paternal Grandmother   . Breast cancer Paternal Grandmother   . Cancer Maternal Aunt        breast  . Breast cancer Maternal Aunt      Current Outpatient Medications:  .  acetaZOLAMIDE (DIAMOX) 250 MG tablet, TAKE 1 TABLET BY MOUTH TWICE A DAY, Disp: 180 tablet, Rfl: 1 .  AIMOVIG 140 MG/ML SOAJ, INJECT 140 MG INTO THE SKIN EVERY 30 DAYS., Disp: 1 pen, Rfl: 3 .  atorvastatin (LIPITOR) 10 MG tablet, TAKE 1 TABLET BY MOUTH EVERY DAY, Disp:  90 tablet, Rfl: 1 .  Blood Glucose Monitoring Suppl (ONETOUCH VERIO IQ SYSTEM) w/Device KIT, by Does not apply route. Use to check blood sugars twice, Disp: , Rfl:  .  busPIRone (BUSPAR) 5 MG tablet, TAKE 1 TABLET BY MOUTH TWICE A DAY, Disp: 180 tablet, Rfl: 1 .  clonazePAM (KLONOPIN) 0.5 MG tablet, TAKE 1 TABLET BY MOUTH 3 TIMES A DAY AS NEEDED FOR ANXIETY, Disp: 30 tablet, Rfl: 1 .  glucose blood (ONETOUCH VERIO) test strip, 1 each by Other route as needed for other. Insert 1 by subcutaneous route 2 times every day check blood sugar before breakfast and dinner, Disp: , Rfl:  .  Insulin Pen Needle (PEN NEEDLES) 32G X 4 MM MISC, 1 each by Does not apply route once a week., Disp: 30 each, Rfl: 3 .  Lasmiditan Succinate (REYVOW) 100 MG TABS, Take 1 tablet by mouth daily as needed., Disp: 16 tablet, Rfl: 5 .  Lifitegrast (XIIDRA) 5 % SOLN, Place 1 drop into both eyes daily., Disp: , Rfl:  .  lisinopril-hydrochlorothiazide (ZESTORETIC) 10-12.5 MG tablet, Take 1 tablet by mouth daily., Disp: 90 tablet, Rfl: 1 .  Naltrexone-buPROPion HCl ER (CONTRAVE) 8-90 MG TB12, Take 2 tablets by  mouth in the morning and at bedtime., Disp: 120 tablet, Rfl: 1 .  OZEMPIC, 1 MG/DOSE, 2 MG/1.5ML SOPN, INJECT 1 MG SUBCUTANEOUSLY EVERY WEEK ON THE SAME DAY IN ABDOMEN, THIGH, OR UPPER ARM (ROTATE), Disp: 9 pen, Rfl: 3 .  pregabalin (LYRICA) 100 MG capsule, Take 1 capsule (100 mg total) by mouth 3 (three) times daily., Disp: 90 capsule, Rfl: 6 .  topiramate (TOPAMAX) 50 MG tablet, Take 2.5 tablets (125 mg total) by mouth at bedtime., Disp: 75 tablet, Rfl: 3 .  TURMERIC PO, Take 1 capsule by mouth daily., Disp: , Rfl:  .  Vitamin D, Ergocalciferol, (DRISDOL) 1.25 MG (50000 UNIT) CAPS capsule, Take 1 capsule (50,000 Units total) by mouth every 7 (seven) days., Disp: 12 capsule, Rfl: 0 .  benzonatate (TESSALON) 200 MG capsule, Take 1 capsule (200 mg total) by mouth 2 (two) times daily as needed for cough. (Patient not taking:  Reported on 01/08/2020), Disp: 20 capsule, Rfl: 0   No Known Allergies   Review of Systems  +R middle finger pain, swelling. No paresthesia There were no vitals filed for this visit. There is no height or weight on file to calculate BMI.   Objective:  Physical Exam Vitals reviewed.  Constitutional:      Appearance: She is obese.  HENT:     Right Ear: External ear normal.     Left Ear: External ear normal.  Eyes:     General: No scleral icterus.    Conjunctiva/sclera: Conjunctivae normal.  Pulmonary:     Effort: Pulmonary effort is normal.  Musculoskeletal:        General: Normal range of motion.     Cervical back: Neck supple.     Comments: R MIDDLE FINGER- has moderate swelling and tenderness on distal medial and volar finger area. Has ecchymotic area and hardening of the area that was thought to be perionychia on last visit. Is is a little warm, and there is mild erythema below nail bed. ROM is normal. Has nl sensation  Skin:    General: Skin is warm and dry.  Neurological:     Mental Status: She is alert and oriented to person, place, and time.  Psychiatric:        Mood and Affect: Mood normal.        Behavior: Behavior normal.        Thought Content: Thought content normal.        Judgment: Judgment normal.     Assessment And Plan:    1. Paronychia of right middle finger- she was sent to urgent care for further eval. I am concerned she may have FB, and needs I&D done.    Lakeia Bradshaw RODRIGUEZ-SOUTHWORTH, PA-C    THE PATIENT IS ENCOURAGED TO PRACTICE SOCIAL DISTANCING DUE TO THE COVID-19 PANDEMIC.

## 2020-01-08 NOTE — ED Triage Notes (Signed)
Pt is here with right hand pain that started a month ago, states her PCP sent her here and attached notes for the provider to read. Pt has not taken anything to relieve discomfort.

## 2020-01-08 NOTE — Discharge Instructions (Signed)
Go to [facility] walk-in clinic for evaluations of possible felon middle right finger

## 2020-01-09 ENCOUNTER — Other Ambulatory Visit: Payer: Self-pay | Admitting: Nurse Practitioner

## 2020-02-09 ENCOUNTER — Encounter: Payer: Self-pay | Admitting: Nurse Practitioner

## 2020-02-14 ENCOUNTER — Other Ambulatory Visit: Payer: Self-pay | Admitting: Nurse Practitioner

## 2020-02-14 DIAGNOSIS — E669 Obesity, unspecified: Secondary | ICD-10-CM

## 2020-02-24 ENCOUNTER — Other Ambulatory Visit: Payer: Self-pay

## 2020-02-24 MED ORDER — SAXENDA 18 MG/3ML ~~LOC~~ SOPN: 3.0000 mg | PEN_INJECTOR | Freq: Every day | SUBCUTANEOUS | 1 refills | Status: DC

## 2020-03-02 ENCOUNTER — Telehealth: Payer: Self-pay

## 2020-03-02 NOTE — Telephone Encounter (Signed)
Pa sent to plan for saxenda   

## 2020-03-11 ENCOUNTER — Telehealth: Payer: Self-pay

## 2020-03-11 NOTE — Telephone Encounter (Signed)
PA for Kirke Shaggy has been approved and pt picked up med on 03/03/20. YL,RMA

## 2020-03-15 ENCOUNTER — Ambulatory Visit (INDEPENDENT_AMBULATORY_CARE_PROVIDER_SITE_OTHER): Payer: No Typology Code available for payment source | Admitting: Nurse Practitioner

## 2020-03-15 ENCOUNTER — Other Ambulatory Visit: Payer: Self-pay

## 2020-03-15 ENCOUNTER — Encounter: Payer: Self-pay | Admitting: Nurse Practitioner

## 2020-03-15 VITALS — BP 110/64 | HR 72 | Temp 98.2°F | Ht 65.2 in | Wt 201.8 lb

## 2020-03-15 DIAGNOSIS — E669 Obesity, unspecified: Secondary | ICD-10-CM | POA: Diagnosis not present

## 2020-03-15 DIAGNOSIS — E119 Type 2 diabetes mellitus without complications: Secondary | ICD-10-CM

## 2020-03-15 DIAGNOSIS — Z1159 Encounter for screening for other viral diseases: Secondary | ICD-10-CM | POA: Diagnosis not present

## 2020-03-15 DIAGNOSIS — Z800 Family history of malignant neoplasm of digestive organs: Secondary | ICD-10-CM

## 2020-03-15 DIAGNOSIS — F909 Attention-deficit hyperactivity disorder, unspecified type: Secondary | ICD-10-CM

## 2020-03-15 DIAGNOSIS — Z1211 Encounter for screening for malignant neoplasm of colon: Secondary | ICD-10-CM

## 2020-03-15 MED ORDER — AMPHETAMINE-DEXTROAMPHETAMINE 20 MG PO TABS: 20.0000 mg | ORAL_TABLET | Freq: Every day | ORAL | 0 refills | Status: DC

## 2020-03-15 NOTE — Progress Notes (Signed)
Subjective:     Patient ID: Virginia Douglas , female    DOB: 07-28-1976 , 45 y.o.   MRN: [id number]   Chief Complaint  Patient presents with  . Weight Check  . Diabetes    HPI  Here today for weight check and DM II follow up. She reports that she is doing well with the saxenda but does report some burning and increase in reflux. She reports that her CBG's are slightly elevated in the 200's in the morning. She does reports some anxiety and stress over the last two months. She eats one meal per day usually at 2:00 pm and  reports not drinking water but is drinking sodas everyday. She is not exercising and is feeling exhausted and tired lately for a bout a week. She started adderall 40 mg per day in June by her Psychiatrist. She is sleeping better now but didn't sleep for four days last week after the increase of Adderall.     Wt Readings from Last 3 Encounters: 03/15/20 : 201 lb 12.8 oz (91.5 kg) 01/08/20 : 199 lb (90.3 kg) 12/30/19 : 191 lb (86.6 kg)  She   Diabetes She presents for her follow-up diabetic visit. She has type 2 diabetes mellitus. Her disease course has been stable. There are no hypoglycemic associated symptoms. Pertinent negatives for hypoglycemia include no dizziness or headaches. There are no diabetic associated symptoms. Pertinent negatives for diabetes include no chest pain and no fatigue. There are no hypoglycemic complications. There are no diabetic complications. Current diabetic treatment includes oral agent (monotherapy). She is compliant with treatment most of the time. When asked about meal planning, she reported none. She has not had a previous visit with a dietitian. She rarely participates in exercise. An ACE inhibitor/angiotensin II receptor blocker is being taken. She does not see a podiatrist.Eye exam is current.   Past Medical History:  Diagnosis Date  . Anemia    iron deficiency  . Diabetes ([place])   . Edema   . Migraine headache      Family  History  Problem Relation Age of Onset  . Diabetes Mother   . Hypertension Mother   . Diabetes Father   . Hypertension Father   . Kidney failure Father   . Hypertension Brother   . Diabetes Maternal Grandmother   . Breast cancer Maternal Grandmother   . Diabetes Paternal Grandmother   . Breast cancer Paternal Grandmother   . Cancer Maternal Aunt        breast  . Breast cancer Maternal Aunt      Current Outpatient Medications:  .  acetaZOLAMIDE (DIAMOX) 250 MG tablet, TAKE 1 TABLET BY MOUTH TWICE A DAY, Disp: 180 tablet, Rfl: 1 .  AIMOVIG 140 MG/ML SOAJ, INJECT 140 MG INTO THE SKIN EVERY 30 DAYS., Disp: 1 pen, Rfl: 3 .  atorvastatin (LIPITOR) 10 MG tablet, TAKE 1 TABLET BY MOUTH EVERY DAY, Disp: 90 tablet, Rfl: 1 .  Blood Glucose Monitoring Suppl (ONETOUCH VERIO IQ SYSTEM) w/Device KIT, by Does not apply route. Use to check blood sugars twice, Disp: , Rfl:  .  busPIRone (BUSPAR) 5 MG tablet, TAKE 1 TABLET BY MOUTH TWICE A DAY, Disp: 180 tablet, Rfl: 1 .  clonazePAM (KLONOPIN) 0.5 MG tablet, TAKE 1 TABLET BY MOUTH 3 TIMES A DAY AS NEEDED FOR ANXIETY, Disp: 30 tablet, Rfl: 1 .  glucose blood (ONETOUCH VERIO) test strip, 1 each by Other route as needed for other. Insert 1 by subcutaneous route  2 times every day check blood sugar before breakfast and dinner, Disp: , Rfl:  .  Insulin Pen Needle (PEN NEEDLES) 32G X 4 MM MISC, 1 each by Does not apply route once a week., Disp: 30 each, Rfl: 3 .  Lasmiditan Succinate (REYVOW) 100 MG TABS, Take 1 tablet by mouth daily as needed., Disp: 16 tablet, Rfl: 5 .  Lifitegrast (XIIDRA) 5 % SOLN, Place 1 drop into both eyes daily., Disp: , Rfl:  .  Liraglutide -Weight Management (SAXENDA) 18 MG/3ML SOPN, Inject 0.5 mLs (3 mg total) into the skin daily., Disp: 3 mL, Rfl: 1 .  lisinopril-hydrochlorothiazide (ZESTORETIC) 10-12.5 MG tablet, Take 1 tablet by mouth daily., Disp: 90 tablet, Rfl: 1 .  OZEMPIC, 1 MG/DOSE, 2 MG/1.5ML SOPN, INJECT 1 MG  SUBCUTANEOUSLY EVERY WEEK ON THE SAME DAY IN ABDOMEN, THIGH, OR UPPER ARM (ROTATE), Disp: 9 pen, Rfl: 1 .  pregabalin (LYRICA) 100 MG capsule, Take 1 capsule (100 mg total) by mouth 3 (three) times daily., Disp: 90 capsule, Rfl: 6 .  topiramate (TOPAMAX) 50 MG tablet, Take 2.5 tablets (125 mg total) by mouth at bedtime., Disp: 75 tablet, Rfl: 3 .  TURMERIC PO, Take 1 capsule by mouth daily., Disp: , Rfl:  .  Vitamin D, Ergocalciferol, (DRISDOL) 1.25 MG (50000 UNIT) CAPS capsule, TAKE 1 CAPSULE (50,000 UNITS TOTAL) BY MOUTH EVERY 7 (SEVEN) DAYS., Disp: 12 capsule, Rfl: 0 .  benzonatate (TESSALON) 200 MG capsule, Take 1 capsule (200 mg total) by mouth 2 (two) times daily as needed for cough. (Patient not taking: Reported on 01/08/2020), Disp: 20 capsule, Rfl: 0 .  CONTRAVE 8-90 MG TB12, TAKE 2 TABLETS BY MOUTH IN THE MORNING AND AT BEDTIME. (Patient not taking: Reported on 03/15/2020), Disp: 120 tablet, Rfl: 1   No Known Allergies   Review of Systems  Constitutional: Negative.  Negative for fatigue.  HENT: Negative.   Respiratory: Negative.  Negative for cough.   Cardiovascular: Negative.  Negative for chest pain, palpitations and leg swelling.  Gastrointestinal: Negative for abdominal pain.       Reflux  Endocrine: Negative.   Genitourinary: Negative.   Musculoskeletal: Negative.   Skin: Negative.   Neurological: Negative.  Negative for dizziness and headaches.  Psychiatric/Behavioral: Negative.      Today's Vitals   03/15/20 0840  BP: 110/64  Pulse: 72  Temp: 98.2 F (36.8 C)  TempSrc: Oral  Weight: 201 lb 12.8 oz (91.5 kg)  Height: 5' 5.2" (1.656 m)  PainSc: 0-No pain   Body mass index is 33.38 kg/m.   Objective:  Physical Exam Vitals reviewed.  Constitutional:      General: She is not in acute distress.    Appearance: Normal appearance. She is well-developed. She is obese.  HENT:     Head: Normocephalic and atraumatic.  Eyes:     Pupils: Pupils are equal, round, and  reactive to light.  Cardiovascular:     Rate and Rhythm: Normal rate and regular rhythm.     Pulses: Normal pulses.     Heart sounds: Normal heart sounds. No murmur heard.   Pulmonary:     Effort: Pulmonary effort is normal.     Breath sounds: Normal breath sounds.  Abdominal:     General: Bowel sounds are normal.     Comments: Reflux in the morning and at night  Musculoskeletal:        General: No tenderness. Normal range of motion.  Skin:    General: Skin is  warm and dry.     Capillary Refill: Capillary refill takes less than 2 seconds.  Neurological:     General: No focal deficit present.     Mental Status: She is alert and oriented to person, place, and time.     Cranial Nerves: No cranial nerve deficit.  Psychiatric:        Mood and Affect: Mood normal.        Behavior: Behavior normal.        Thought Content: Thought content normal.        Judgment: Judgment normal.         Assessment And Plan:     1. Type 2 diabetes mellitus without complication, without long-term current use of insulin (HCC)  Chronic, she is to stop the Ozempic weekly she will be on Saxenda, she responded well to this previously - Hemoglobin A1c - BMP8+eGFR  2. Obesity (BMI 30-39.9)  Weight is stable.  Advised to avoid drinking sodas due to the high sugar and carbohydrate content.   Also encouraged to increase her physical activity  3. Attention deficit hyperactivity disorder (ADHD), unspecified ADHD type  She has had an increase to 40 mg in the last couple weeks however she stayed up for 3 days straight last week  Advised her to contact the psychiatrist may need a change to her medication - amphetamine-dextroamphetamine (ADDERALL) 20 MG tablet; Take 1 tablet (20 mg total) by mouth daily.  Dispense: 30 tablet; Refill: 0  4. Encounter for hepatitis C screening test for low risk patient Will check Hepatitis C screening due to recent recommendations to screen all adults 18 years and older -  Hepatitis C antibody  5. Encounter for screening colonoscopy  According to USPTF Colorectal cancer Screening guidelines. Colonoscopy is recommended every 10 years, starting at age 64years.  Will refer to GI for colon cancer screening.  Reports a family history of colon cancer - Ambulatory referral to Gastroenterology  6. Family history of colon cancer  - Ambulatory referral to Gastroenterology     Marylu Lund, RN    THE PATIENT IS ENCOURAGED TO PRACTICE SOCIAL DISTANCING DUE TO THE COVID-19 PANDEMIC.

## 2020-03-16 ENCOUNTER — Other Ambulatory Visit: Payer: Self-pay | Admitting: Nurse Practitioner

## 2020-03-16 DIAGNOSIS — F419 Anxiety disorder, unspecified: Secondary | ICD-10-CM

## 2020-03-16 LAB — BMP8+EGFR
BUN/Creatinine Ratio: 17 (ref 9–23)
BUN: 11 mg/dL (ref 6–24)
CO2: 22 mmol/L (ref 20–29)
Calcium: 10.1 mg/dL (ref 8.7–10.2)
Chloride: 101 mmol/L (ref 96–106)
Creatinine, Ser: 0.64 mg/dL (ref 0.57–1.00)
GFR calc Af Amer: 126 mL/min/{1.73_m2} (ref >59)
GFR calc non Af Amer: 110 mL/min/{1.73_m2} (ref >59)
Glucose: 75 mg/dL (ref 65–99)
Potassium: 3.9 mmol/L (ref 3.5–5.2)
Sodium: 139 mmol/L (ref 134–144)

## 2020-03-16 LAB — HEMOGLOBIN A1C
Est. average glucose Bld gHb Est-mCnc: 123 mg/dL
Hgb A1c MFr Bld: 5.9 % — ABNORMAL HIGH (ref 4.8–5.6)

## 2020-03-16 LAB — HEPATITIS C ANTIBODY: Hep C Virus Ab: <0.1 s/co ratio (ref 0.0–0.9)

## 2020-03-17 NOTE — Telephone Encounter (Signed)
Clonazepam refill 

## 2020-03-26 ENCOUNTER — Other Ambulatory Visit: Payer: Self-pay | Admitting: Nurse Practitioner

## 2020-03-29 ENCOUNTER — Other Ambulatory Visit: Payer: Self-pay | Admitting: Neurology

## 2020-04-14 ENCOUNTER — Other Ambulatory Visit: Payer: Self-pay | Admitting: Neurology

## 2020-04-15 LAB — HM DIABETES EYE EXAM

## 2020-05-04 NOTE — Progress Notes (Signed)
NEUROLOGY FOLLOW UP OFFICE NOTE  Jevon Littlepage Standen [id number]  HISTORY OF PRESENT ILLNESS: Jasmarie Pruntyis a 44 year old right-handed woman with diabetes and hypertension who follows up for migraines.  UPDATE: Topiramate was increased in April.  She is feeling better. On Lyrica for lumbosacral radiculopathy.  Still with left lower back pain radiating down posterior leg to calf.  Also with left knee pain, sometimes swelling.  Intensity:  severe Duration:  30 to 60 minutes with Reyvow Frequency:  Once a week Current NSAIDS:none Current analgesics:none Other current abortive: none Current triptans:none Current ergotamine:None Current anti-emetic:Zofran 4 mg Current muscle relaxants:Robaxin Current anti-anxiolytic:alprazolam Current sleep aide:none Current Antihypertensive medications:Lisinopril-HCTZ Current Antidepressant medications:Citalopram 67m Current Anticonvulsant medications:Topiramate 125 mg at bedtime, acetazolamide 2542mwicedaily, Lyrica 1007mhree times daily. Current anti-CGRP:Aimovig140m36mrrent Vitamins/Herbal/Supplements:turmeric Current Antihistamines/Decongestants:none Other therapy:Reyvow (rescue) Hormone/birth control:no  Caffeine:One soda a week Diet:Hydrates Exercise:Yes Depression:Yes; Anxiety:Yes Other pain:No Sleep hygiene:Varies  HISTORY: Onset:Remote history of migraines. Controlled for many years. Returned in [id number] Kitchenation:Right sided head/ear radiating down right side of neck. Does not radiate down the right arm. Quality:Pounding in head, burning in neck Initial intensity:Severe.Shedenies thunderclap headache or severe headache that wakes herfrom sleep. Aura:no Prodrome:no Postdrome:no Associated symptoms: Nausea, vomiting, photophobia, phonophobia, blurred vision. She denies associated unilateral numbness or weakness. Initial duration:1  day InitialFrequency:2 days a week InitialFrequency of abortive medication:2 days a week Triggers: Emotional stress Relieving factors: Heating pad, Robaxin Activity:Aggravates  She was evaluated in the ED on 02/10/18, where CTA of head and neck was performed and personally reviewed. It demonstrated empty sella but otherwise unremarkable for mass lesion, aneurysm or dissection. She was diagnosed with iidiopathic intracranial hypertension diagnosed in 2019.She is on Diamox 250mg75mly.She is followed by ophthalmology.Exam note from 10/10/18 stated "maybe slight nasal nerve elevation" but no definite signs of papilledema.  She reports left sided low back pain radiating down the side of the left leg with associated numbness in the side of leg and footsince early 2020She had a lumbar X-ray which demonstrated mild dextroscoliosis apex L4. No injury. Gabapentin previously used was ineffective for neuropathic pain.   Past NSAIDS:Ibuprofen, naproxen Past analgesics: tramadol 50mg 17m abortive triptans:Sumatriptan 100 mg, rizatriptan 10mg, 55mriptan 40mg Pa16muscle relaxants: Flexeril Past anti-emetic:no Past antihypertensive medications:no Past antidepressant medications: nortriptyline 50mg(cau49mnausea/vomiting, problems sleeping) Past anticonvulsant medications: gabapentin 300mg twic79mily(for neuropathic pain) Past vitamins/Herbal/Supplements:no Past antihistamines/decongestants:no Other past therapies:no  Family history of headache:no  MRI of cervical spine from 09/22/15 was personally reviewed and was unremarkable.  PAST MEDICAL HISTORY: Past Medical History:  Diagnosis Date  . Anemia    iron deficiency  . Diabetes (HCC)   . [place]   . Migraine headache     MEDICATIONS: Current Outpatient Medications on File Prior to Visit  Medication Sig Dispense Refill  . acetaZOLAMIDE (DIAMOX) 250 MG tablet TAKE 1 TABLET BY MOUTH TWICE A DAY  180 tablet 1  . AIMOVIG 140 MG/ML SOAJ INJECT 140 MG INTO THE SKIN EVERY 30 DAYS. 1 mL 3  . amphetamine-dextroamphetamine (ADDERALL) 20 MG tablet Take 1 tablet (20 mg total) by mouth daily. 30 tablet 0  . atorvastatin (LIPITOR) 10 MG tablet TAKE 1 TABLET BY MOUTH EVERY DAY 90 tablet 1  . benzonatate (TESSALON) 200 MG capsule Take 1 capsule (200 mg total) by mouth 2 (two) times daily as needed for cough. (Patient not taking: Reported on 01/08/2020) 20 capsule 0  . Blood Glucose Monitoring Suppl (ONETOUCH VERIO IQ SYSTEM) w/Device KIT by Does not apply route. Use to  check blood sugars twice    . busPIRone (BUSPAR) 5 MG tablet TAKE 1 TABLET BY MOUTH TWICE A DAY 180 tablet 1  . clonazePAM (KLONOPIN) 0.5 MG tablet TAKE 1 TABLET BY MOUTH THREE TIMES A DAY AS NEEDED FOR ANXIETY 30 tablet 1  . CONTRAVE 8-90 MG TB12 TAKE 2 TABLETS BY MOUTH IN THE MORNING AND AT BEDTIME. (Patient not taking: Reported on 03/15/2020) 120 tablet 1  . glucose blood (ONETOUCH VERIO) test strip 1 each by Other route as needed for other. Insert 1 by subcutaneous route 2 times every day check blood sugar before breakfast and dinner    . Insulin Pen Needle (PEN NEEDLES) 32G X 4 MM MISC 1 each by Does not apply route once a week. 30 each 3  . Lasmiditan Succinate (REYVOW) 100 MG TABS Take 1 tablet by mouth daily as needed. 16 tablet 5  . Lifitegrast (XIIDRA) 5 % SOLN Place 1 drop into both eyes daily.    . Liraglutide -Weight Management (SAXENDA) 18 MG/3ML SOPN Inject 0.5 mLs (3 mg total) into the skin daily. 3 mL 1  . lisinopril-hydrochlorothiazide (ZESTORETIC) 10-12.5 MG tablet Take 1 tablet by mouth daily. 90 tablet 1  . pregabalin (LYRICA) 100 MG capsule Take 1 capsule (100 mg total) by mouth 3 (three) times daily. 90 capsule 6  . topiramate (TOPAMAX) 50 MG tablet TAKE 2.5 TABLETS (125 MG TOTAL) BY MOUTH AT BEDTIME. 225 tablet 1  . TURMERIC PO Take 1 capsule by mouth daily.    . Vitamin D, Ergocalciferol, (DRISDOL) 1.25 MG (50000  UNIT) CAPS capsule TAKE 1 CAPSULE (50,000 UNITS TOTAL) BY MOUTH EVERY 7 (SEVEN) DAYS. 12 capsule 0  . [DISCONTINUED] metFORMIN (GLUCOPHAGE-XR) 500 MG 24 hr tablet Take 2 tablets (1,000 mg total) by mouth 2 (two) times daily. (Patient not taking: Reported on 06/05/2019) 30 tablet 0  . [DISCONTINUED] Phentermine-Topiramate 11.25-69 MG CP24 Take 1 tablet by mouth daily. 30 capsule 1   No current facility-administered medications on file prior to visit.    ALLERGIES: No Known Allergies  FAMILY HISTORY: Family History  Problem Relation Age of Onset  . Diabetes Mother   . Hypertension Mother   . Diabetes Father   . Hypertension Father   . Kidney failure Father   . Hypertension Brother   . Diabetes Maternal Grandmother   . Breast cancer Maternal Grandmother   . Diabetes Paternal Grandmother   . Breast cancer Paternal Grandmother   . Cancer Maternal Aunt        breast  . Breast cancer Maternal Aunt    SOCIAL HISTORY: Social History   Socioeconomic History  . Marital status: Legally Separated    Spouse name: Jeneen Rinks  . Number of children: 2  . Years of education: 73  . Highest education level: Some college, no degree  Occupational History  . 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[employer]  Tobacco Use  . Smoking status: Former Smoker    Packs/day: 0.50    Years: 25.00    Pack years: 12.50    Types: Cigarettes    Start date: 03/24/2019  . Smokeless tobacco: Never Used  Vaping Use  . Vaping Use: Never used  Substance and Sexual Activity  . Alcohol use: No    Alcohol/week: 0.0 standard drinks  . Drug use: No  . Sexual activity: Yes    Birth control/protection: Surgical  Other Topics Concern  . 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 She lives with her 2  children in a one story house. She drinks one large cup of coffee a day and an occasional tea or soda. She walks daily.      Social Determinants of Health   Financial Resource Strain:   . Difficulty of Paying Living Expenses: Not on file  Food Insecurity:   . Worried About Charity fundraiser in the Last Year: Not on file  . Ran Out of Food in the Last Year: Not on file  Transportation Needs:   . Lack of Transportation (Medical): Not on file  . Lack of Transportation (Non-Medical): Not on file  Physical Activity:   . Days of Exercise per Week: Not on file  . Minutes of Exercise per Session: Not on file  Stress:   . Feeling of Stress : Not on file  Social Connections:   . Frequency of Communication with Friends and Family: Not on file  . Frequency of Social Gatherings with Friends and Family: Not on file  . Attends Religious Services: Not on file  . Active Member of Clubs or Organizations: Not on file  . Attends Archivist Meetings: Not on file  . Marital Status: Not on file  Intimate Partner Violence:   . Fear of Current or Ex-Partner: Not on file  . Emotionally Abused: Not on file  . Physically Abused: Not on file  . Sexually Abused: Not on file    PHYSICAL EXAM: Blood pressure 103/68, pulse 95, height _0  (1.702 m), weight 208 lb 12.8 oz (94.7 kg), SpO2 100 %. General: No acute distress.  Patient appears well-groomed.   Head:  Normocephalic/atraumatic Eyes:  Fundi examined but not visualized Neck: supple, no paraspinal tenderness, full range of motion Heart:  Regular rate and rhythm Lungs:  Clear to auscultation bilaterally Back: No paraspinal tenderness Neurological Exam: alert and oriented to person, place, and time. Attention span and concentration intact, recent and remote memory intact, fund of knowledge intact.  Speech fluent and not dysarthric, language intact.  CN II-XII intact. Bulk and tone normal, muscle strength 5/5 throughout.  Sensation to light  touch, temperature and vibration intact.  Deep tendon reflexes 2+ throughout, toes downgoing.  Finger to nose and heel to shin testing intact.  Gait normal, Romberg negative.  IMPRESSION: 1.  Migraine without aura, without status migrainosus, not intractable 2.  History of idiopathic intracranial hypertension, stable 3.  Left sided lumbar radiculopathy.  PLAN: 1.  For preventative management, Aimovig 121m monthly, topiramate 1268mat bedtime 2.  For lumbar radiculopathy, Lyrica 10051mID.  Still painful.  Check MRI of lumbar spine.  Advised to follow up with PCP regarding knee pain. 3.  For abortive therapy, Reyvow 4.  Limit use of pain relievers to no more than 2 days out of week to prevent risk of rebound or medication-overuse headache. 5.  Keep headache diary 6.  Exercise, hydration, caffeine cessation, sleep hygiene, monitor for and avoid triggers 7.  Follow up 6 months   AdaMetta ClinesO  CC: JanMinette BrineNP

## 2020-05-05 ENCOUNTER — Other Ambulatory Visit: Payer: Self-pay

## 2020-05-05 ENCOUNTER — Encounter: Payer: Self-pay | Admitting: Neurology

## 2020-05-05 ENCOUNTER — Ambulatory Visit (INDEPENDENT_AMBULATORY_CARE_PROVIDER_SITE_OTHER): Payer: No Typology Code available for payment source | Admitting: Neurology

## 2020-05-05 VITALS — BP 103/68 | HR 95 | Ht 67.0 in | Wt 208.8 lb

## 2020-05-05 DIAGNOSIS — G932 Benign intracranial hypertension: Secondary | ICD-10-CM | POA: Diagnosis not present

## 2020-05-05 DIAGNOSIS — G43009 Migraine without aura, not intractable, without status migrainosus: Secondary | ICD-10-CM

## 2020-05-05 DIAGNOSIS — M5416 Radiculopathy, lumbar region: Secondary | ICD-10-CM

## 2020-05-05 NOTE — Patient Instructions (Addendum)
1.   No change in management at this time 2.  Will check MRI of lumbar spine. We have sent a referral to [facility] for your MRI and they will call you directly to schedule your appointment. They are located at [place]. If you need to contact them directly please call [phone].  3.  Follow up with PCP regarding knee pain 4.  Follow up in 6 months.

## 2020-05-09 ENCOUNTER — Other Ambulatory Visit: Payer: Self-pay

## 2020-05-09 ENCOUNTER — Ambulatory Visit
Admission: RE | Admit: 2020-05-09 | Discharge: 2020-05-09 | Disposition: A | Discharge status: Home / Self Care | Payer: No Typology Code available for payment source | Source: Ambulatory Visit | Attending: Neurology | Admitting: Neurology

## 2020-05-09 ENCOUNTER — Other Ambulatory Visit: Payer: Self-pay | Admitting: Nurse Practitioner

## 2020-05-09 DIAGNOSIS — M5416 Radiculopathy, lumbar region: Secondary | ICD-10-CM

## 2020-05-09 IMAGING — MR MR LUMBAR SPINE W/O CM
4 of 5 series · 27 of 48 positions shown · non-contrast
Comparison: None.

CLINICAL DATA: Low back pain with left leg pain. Numbness and
tingling.

EXAM:
MRI LUMBAR SPINE WITHOUT CONTRAST
TECHNIQUE: Multiplanar, multisequence MR imaging of the lumbar spine was
performed. No intravenous contrast was administered.

[Series 2: T2 · sagittal · 4.0mm · 1.09mm/px · 6 of 16 slices shown (1 of 2)]
[im 1/16]
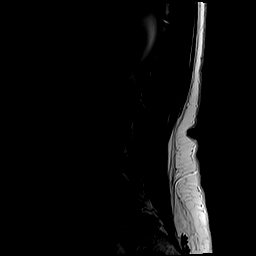
[im 4/16]
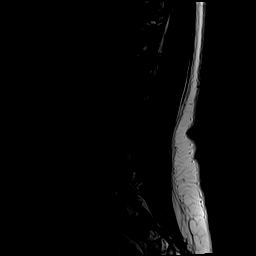
[im 7/16]
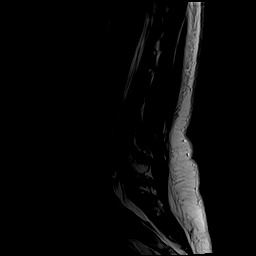
[im 10/16]
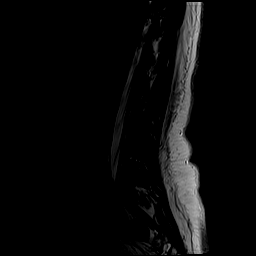
[im 13/16]
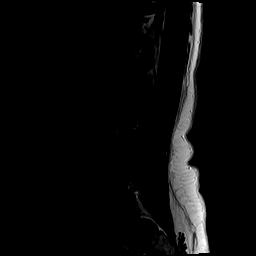
[im 16/16]
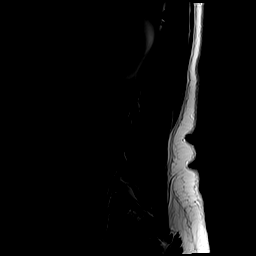

[Series 4: T1 · sagittal · 4.0mm · 1.09mm/px · 6 of 16 slices shown (1 of 2)]
[im 1/16]
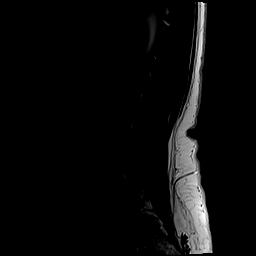
[im 4/16]
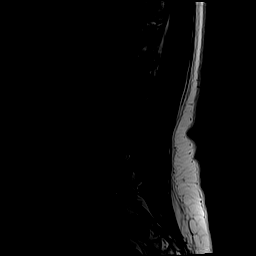
[im 7/16]
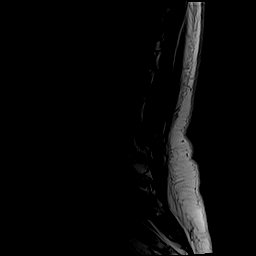
[im 10/16]
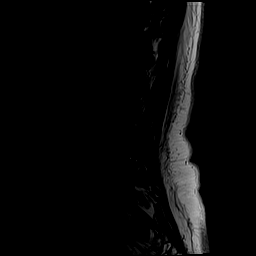
[im 13/16]
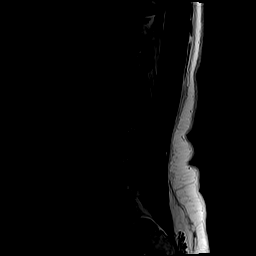
[im 16/16]
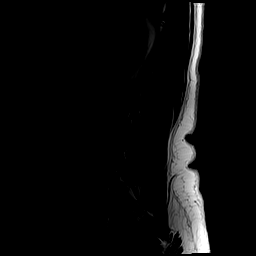

[Series 5: T2 · axial · 4.0mm · 0.39mm/px · z∈[-99,+100]mm · 9 of 39 slices shown (2 of 2)]
[im 1/39]
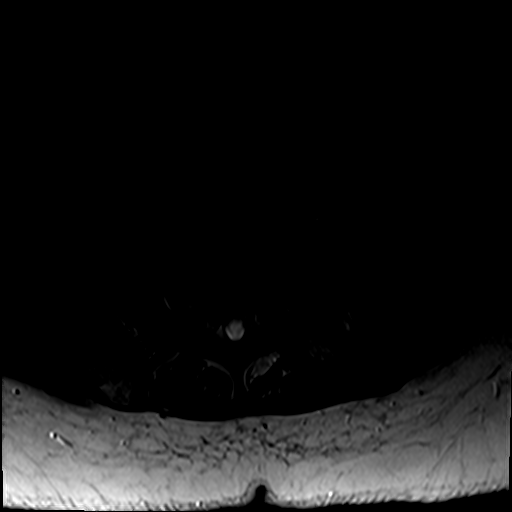
[im 6/39]
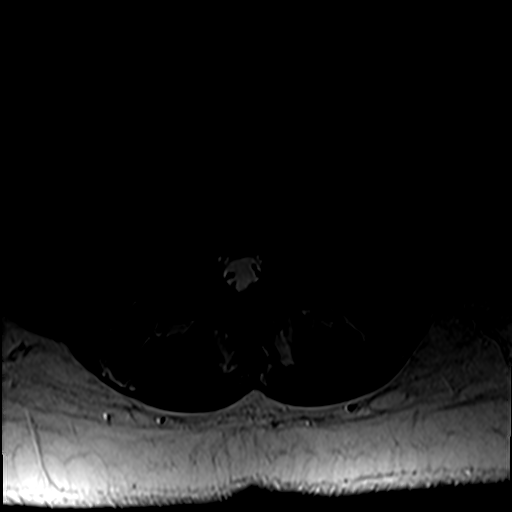
[im 11/39]
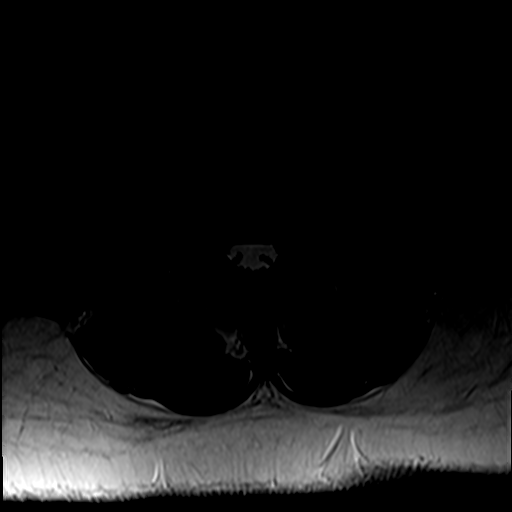
[im 17/39]
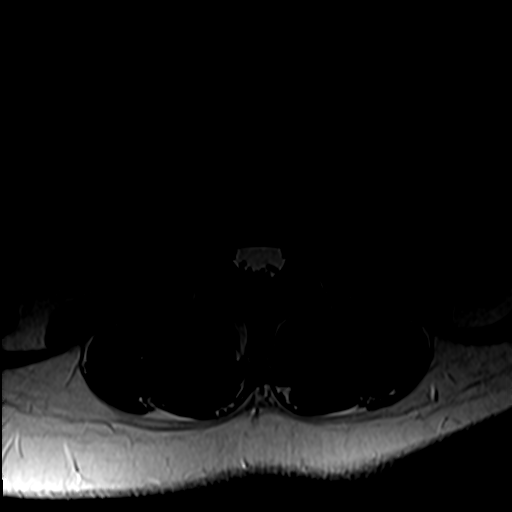
[im 20/39]
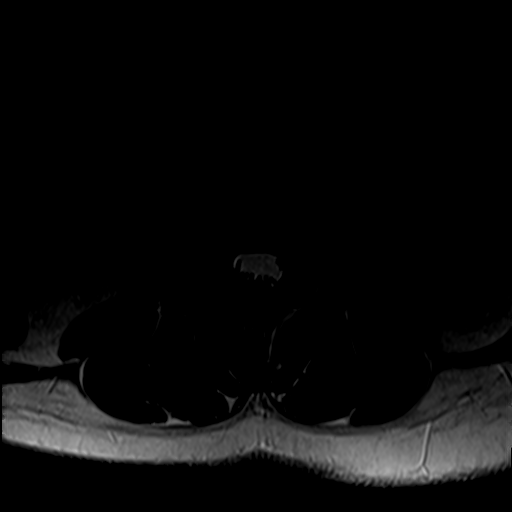
[im 22/39]
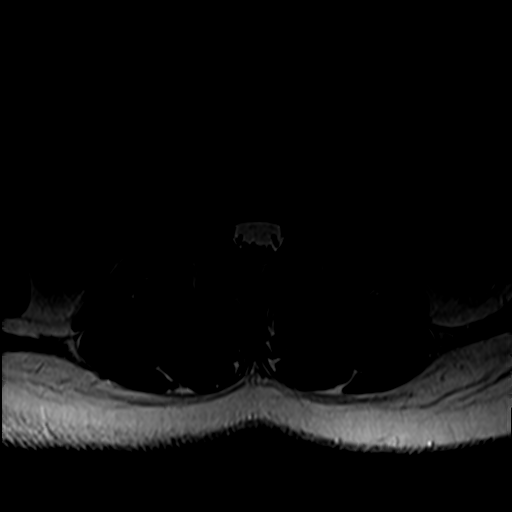
[im 28/39]
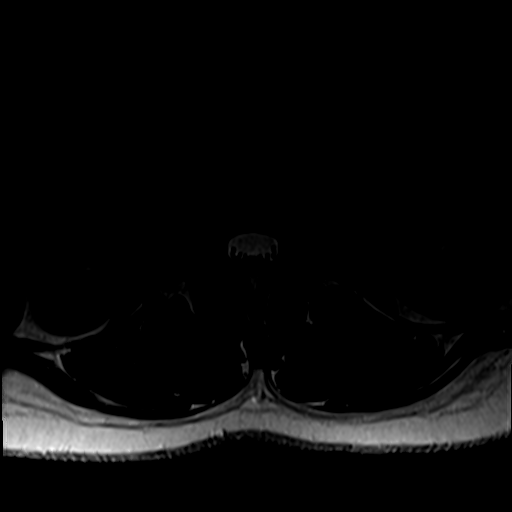
[im 33/39]
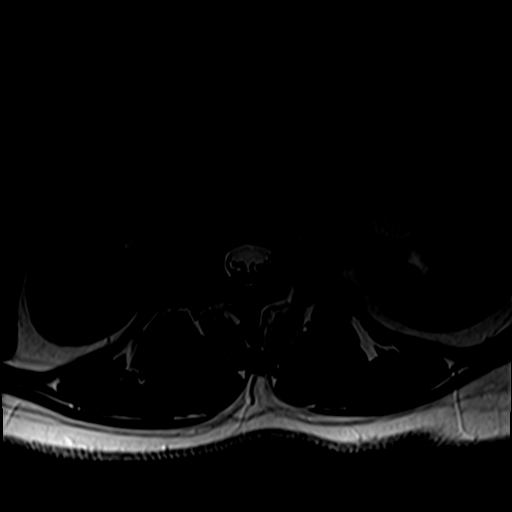
[im 39/39]
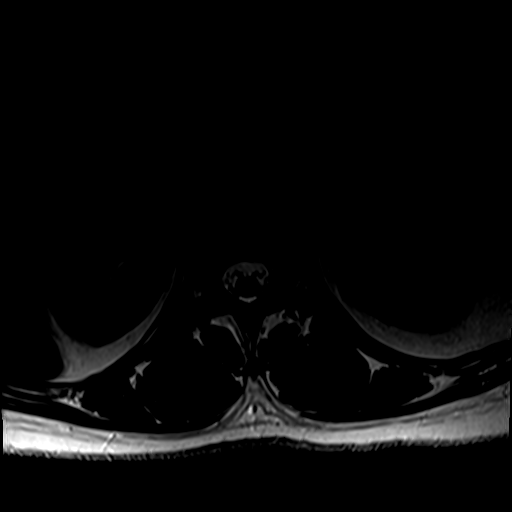

[Series 6: T1 · axial · 4.0mm · 0.39mm/px · z∈[-99,+71]mm · 6 of 39 slices shown (2 of 2)]
[im 1/39]
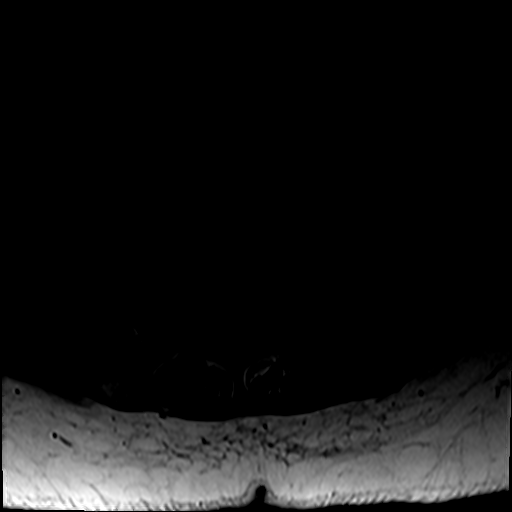
[im 6/39]
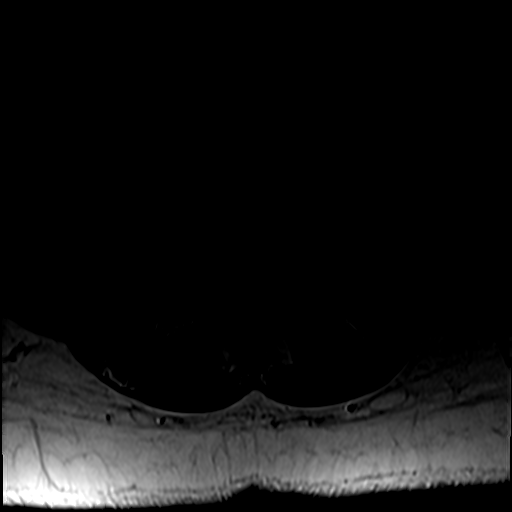
[im 11/39]
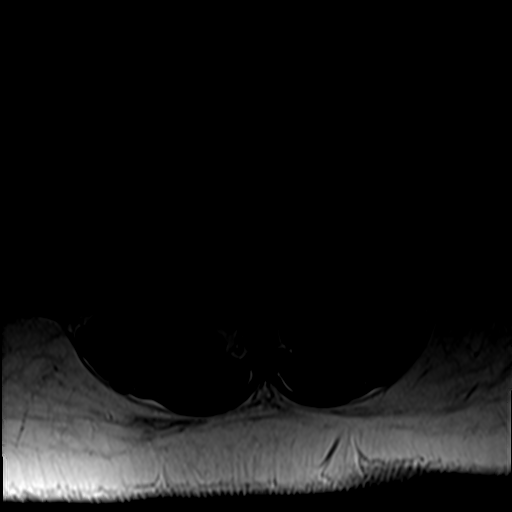
[im 17/39]
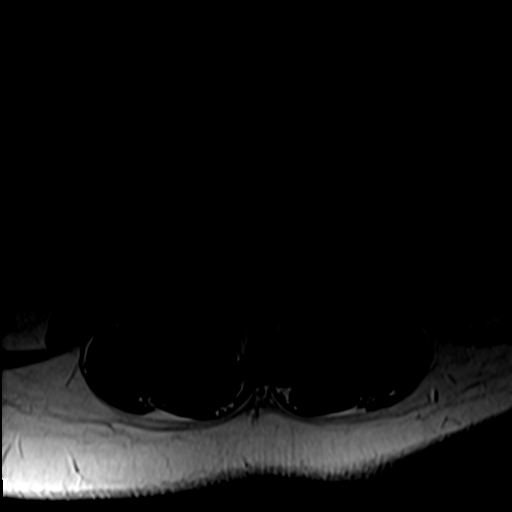
[im 20/39]
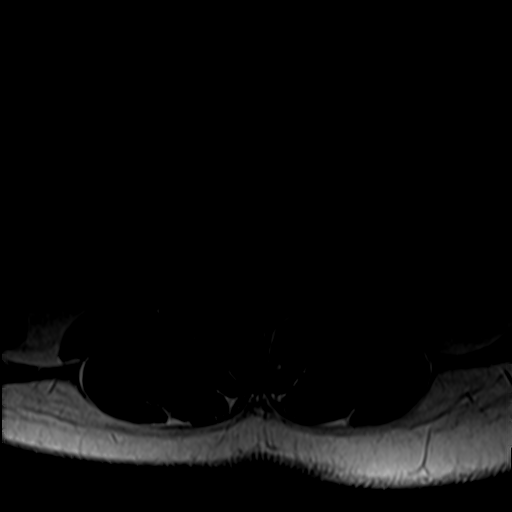
[im 33/39]
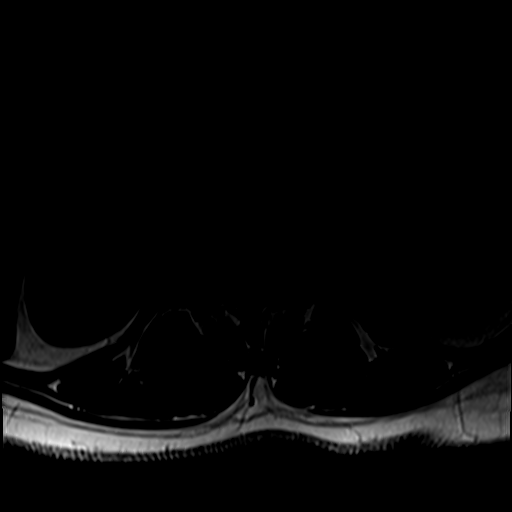

[27 of 48 positions shown; findings below may reference images not displayed]

FINDINGS: Segmentation: The lowest lumbar type non-rib-bearing vertebra is
labeled as L5.

Alignment:  Mild levoconvex lumbar scoliosis without subluxation.

Vertebrae:  No significant vertebral marrow edema is identified.

Conus medullaris and cauda equina: Conus extends to the L1 level.
Conus and cauda equina appear normal.

Paraspinal and other soft tissues: Unremarkable

Disc levels:

T12-L1: Unremarkable.

L1-2: Unremarkable.

L2-3: Minimal disc bulge, no impingement.

L3-4: Minimal disc bulge, no impingement.

L4-5: Mild disc bulge, no impingement.

L5-S1: Borderline bilateral foraminal stenosis due to disc bulge and
mild facet arthropathy
IMPRESSION: 1. Mild lumbar spondylosis and degenerative disc disease, causing
borderline bilateral foraminal stenosis at L5-S1.
2. Mild levoconvex lumbar scoliosis without subluxation.

## 2020-05-11 ENCOUNTER — Other Ambulatory Visit: Payer: Self-pay | Admitting: Neurology

## 2020-05-11 ENCOUNTER — Telehealth: Payer: Self-pay

## 2020-05-11 DIAGNOSIS — M5416 Radiculopathy, lumbar region: Secondary | ICD-10-CM

## 2020-05-11 MED ORDER — DULOXETINE HCL 30 MG PO CPEP: ORAL_CAPSULE | ORAL | 0 refills | Status: DC

## 2020-05-11 NOTE — Telephone Encounter (Signed)
Pt advised of her MRI results. Pt wants to go ahead with Pt Will add PT referral.   Pt states she still having pain, unable to lay on her right side and unable to lift. Is there a way something for pain?

## 2020-05-11 NOTE — Telephone Encounter (Signed)
Script sent to [facility] in [place]

## 2020-05-11 NOTE — Telephone Encounter (Signed)
Pt states she is no longer taken citalopram. Pt would like to have Cymbalta called into the pharmacy.

## 2020-05-11 NOTE — Telephone Encounter (Signed)
-----   Message from Pieter Partridge, DO sent at 05/11/2020  7:40 AM EDT ----- MRI shows a disc bulge that may be mildly irritating a nerve in the back.  If she hasn't undergone physical therapy yet, I would do that first (I did order it a year ago but does not seem that patient went for therapy).

## 2020-05-11 NOTE — Telephone Encounter (Signed)
If she is no longer on an antidepressant (she was previously on citalopram), then we can start Cymbalta 30mg  daily for one week, then increase to 60mg  daily.  If she has not had any benefit to Lyrica, then I would stop it.

## 2020-05-18 ENCOUNTER — Ambulatory Visit: Payer: No Typology Code available for payment source | Attending: Neurology | Admitting: Physical Therapy

## 2020-05-18 ENCOUNTER — Other Ambulatory Visit: Payer: Self-pay

## 2020-05-18 ENCOUNTER — Encounter: Payer: Self-pay | Admitting: Physical Therapy

## 2020-05-18 DIAGNOSIS — G8929 Other chronic pain: Secondary | ICD-10-CM | POA: Diagnosis present

## 2020-05-18 DIAGNOSIS — M5442 Lumbago with sciatica, left side: Secondary | ICD-10-CM | POA: Insufficient documentation

## 2020-05-18 DIAGNOSIS — R293 Abnormal posture: Secondary | ICD-10-CM | POA: Diagnosis present

## 2020-05-18 DIAGNOSIS — M6281 Muscle weakness (generalized): Secondary | ICD-10-CM | POA: Diagnosis present

## 2020-05-18 NOTE — Therapy (Signed)
[facility] [place], [place], [address] Phone: [phone]   Fax:  [phone]  Physical Therapy Evaluation  Patient Details  Name: Virginia Douglas MRN: [id number] Date of Birth: 1976/09/04 Referring Provider (PT): Metta Clines, DO   Encounter Date: 05/18/2020   PT End of Session - 05/18/20 2033    Visit Number 1    Number of Visits 12    Date for PT Re-Evaluation 07/06/20    PT Start Time 5462    PT Stop Time 1556    PT Time Calculation (min) 45 min    Activity Tolerance Patient limited by pain    Behavior During Therapy [facility] for tasks assessed/performed           Past Medical History:  Diagnosis Date  . Anemia    iron deficiency  . Diabetes ([place])   . Edema   . Migraine headache     Past Surgical History:  Procedure Laterality Date  . ABDOMINAL HYSTERECTOMY    . CHOLECYSTECTOMY      There were no vitals filed for this visit.    Subjective Assessment - 05/18/20 1950    Subjective COVID-19 screening performed upon arrival.Patient arrives to physical therapy with a chronic history of low back pain that radiates down to left foot with intense pain in medial knee region. Patient has pain with ADLs such as dressing and showering activities. Patient has pain with sitting and standing which limits her ability to perform her job. Patient's pain at worst is rated at 12/10 and pain at best is 5/10. Patient's goals are to decrease pain, improve movement, improve sitting and standing tolerance, and have less difficulties with work activities.    Pertinent History HTN, DM    Limitations Standing;Sitting;House hold activities    How long can you sit comfortably? 45 mins    How long can you stand comfortably? 30 mins    How long can you walk comfortably? longer distances increases pain    Diagnostic tests MRI: bulging discs; see media for full report    Patient Stated Goals decrease pain    Currently in Pain? Yes     Pain Score 8     Pain Location Back    Pain Orientation Left    Pain Descriptors / Indicators Aching;Throbbing;Sore;Shooting;Numbness    Pain Type Chronic pain    Pain Radiating Towards left foot    Pain Onset More than a month ago    Pain Frequency Constant    Aggravating Factors  "sitting/standing"    Pain Relieving Factors "sitting/standing"    Effect of Pain on Daily Activities pain with ADLs and work activities              [facility] PT Assessment - 05/18/20 0001      Assessment   Medical Diagnosis Lumbar Radiculopathy    Referring Provider (PT) Metta Clines, DO    Onset Date/Surgical Date --   ongoing   Next MD Visit "6 months"    Prior Therapy no      Precautions   Precautions None      Restrictions   Weight Bearing Restrictions No      Balance Screen   Has the patient fallen in the past 6 months Yes    How many times? 1   "missed a step"   Has the patient had a decrease in activity level because of a fear of falling?  No    Is the patient reluctant to  leave their home because of a fear of falling?  No      Home Ecologist residence      Prior Function   Level of Independence Independent    Vocation Full time employment    Vocation Requirements Administration      ROM / Strength   AROM / PROM / Strength Strength      Strength   Strength Assessment Site Knee;Hip    Right/Left Hip Right;Left    Right Hip Flexion 4/5    Right Hip Extension 4-/5    Right Hip ABduction 4-/5    Left Hip Flexion 3+/5    Left Hip Extension 3+/5    Left Hip ABduction 3/5    Right/Left Knee Right;Left    Right Knee Flexion 4/5    Right Knee Extension 4/5    Left Knee Flexion 3+/5    Left Knee Extension 3+/5      Palpation   Palpation comment tender to palpation to left lumbar paraspinals, QL and glutes; increased lumbar paraspinal and QL tone      Transfers   Five time sit to stand comments  21.33 seconds modified with UE support    Comments  slow transitions from sit to stand; left leg extended when coming to standing       Ambulation/Gait   Gait Pattern Step-through pattern;Decreased stride length;Decreased stance time - left;Decreased step length - left;Decreased weight shift to left;Antalgic;Trunk flexed                      Objective measurements completed on examination: See above findings.               PT Education - 05/18/20 2005    Education Details draw in, supine marching, prone on elbows, single knee to chest stretch    Person(s) Educated Patient    Methods Explanation;Demonstration;Handout    Comprehension Verbalized understanding;Returned demonstration               PT Long Term Goals - 05/18/20 2033      PT LONG TERM GOAL #1   Title Patient will be independent with HEP    Time 6    Period Weeks    Status New      PT LONG TERM GOAL #2   Title Patient will demonstrate 4/5 or greater L LE MMT to improve stability during functional tasks.    Time 6    Period Weeks    Status New      PT LONG TERM GOAL #3   Title Patient will report a centralization or elimiation  of L neurological symptoms to indicate decreased nerve irritation.    Time 6    Period Weeks    Status New      PT LONG TERM GOAL #4   Title Patient will report ability to perform ADLs, home activities and work activities with low back pain less than or equal to 3/10.    Time 6    Period Weeks    Status New      PT LONG TERM GOAL #5   Title Patient will report ability to sit for 1 hr or greater with low back pain less than or equal to 3/10 to perform work activities.    Time 6    Period Weeks    Status New                  Plan -  05/18/20 2102    Clinical Impression Statement Patient is a 44 year old female who presents to physical therapy with a chronic history of left sided low back pain with neurological symptoms that radiate to left foot. Patient with decreased L LE MMT in comparison to Right.  Patient very tender to palpation to left lumbar paraspinals, QL and glute with increased tone. Patient ambulates with an antalgic gait pattern with decreased L stance time. Patient and PT discussed POC and HEP to which patient reported understanding. Patient would benefit from skilled physical therapy to address deficits and address patient's goals.    Personal Factors and Comorbidities Comorbidity 2;Time since onset of injury/illness/exacerbation    Comorbidities HTN, DM    Examination-Activity Limitations Dressing;Bathing;Locomotion Level;Transfers;Sit;Sleep;Stand;Stairs    Examination-Participation Restrictions Occupation    Stability/Clinical Decision Making Stable/Uncomplicated    Clinical Decision Making Low    Rehab Potential Fair    PT Frequency 2x / week    PT Duration 6 weeks    PT Treatment/Interventions ADLs/Self Care Home Management;Cryotherapy;Electrical Stimulation;Ultrasound;Traction;Moist Heat;Gait training;Stair training;Functional mobility training;Therapeutic activities;Therapeutic exercise;Balance training;Neuromuscular re-education;Manual techniques;Passive range of motion;Patient/family education    PT Next Visit Plan nustep, core stability and strengthening, LE strengthening; STW/M, combo to left low back; modalities PRN for pain relief    PT Home Exercise Plan see patient education section    Consulted and Agree with Plan of Care Patient           Patient will benefit from skilled therapeutic intervention in order to improve the following deficits and impairments:  Abnormal gait, Decreased activity tolerance, Decreased mobility, Decreased strength, Postural dysfunction, Pain, Difficulty walking, Decreased range of motion  Visit Diagnosis: Chronic left-sided low back pain with left-sided sciatica  Muscle weakness (generalized)  Abnormal posture     Problem List Patient Active Problem List   Diagnosis Date Noted  . Obesity (BMI 30-39.9) 03/15/2020  . Sciatic  nerve pain 12/10/2019  . Acute pain of left shoulder 06/05/2019  . Grief 06/05/2019  . Migraine 06/05/2019  . Type 2 diabetes mellitus without complication, without long-term current use of insulin ([place]) 04/07/2019  . Depression 04/07/2019  . Obesity due to excess calories without serious comorbidity 04/07/2019  . Left leg pain 04/07/2019  . Anxiety 04/07/2019  . Headache 07/06/2018  . Diabetes ([place]) 12/08/2015  . Essential hypertension 12/08/2015  . Smoker 12/08/2015  . LLQ pain 12/08/2015  . Paresthesia 09/16/2015  . Urinary urgency 09/16/2015    Gabriela Eves, PT, DPT 05/18/2020, 9:10 PM  [facility] [address] [place], [place], [address] Phone: [phone]   Fax:  [phone]  Name: Ariday Brinker MRN: [id number] Date of Birth: 1976-01-01

## 2020-05-25 ENCOUNTER — Telehealth: Payer: Self-pay

## 2020-05-25 ENCOUNTER — Other Ambulatory Visit: Payer: Self-pay

## 2020-05-25 ENCOUNTER — Ambulatory Visit: Payer: No Typology Code available for payment source | Admitting: Physical Therapy

## 2020-05-25 DIAGNOSIS — G8929 Other chronic pain: Secondary | ICD-10-CM

## 2020-05-25 DIAGNOSIS — M6281 Muscle weakness (generalized): Secondary | ICD-10-CM

## 2020-05-25 DIAGNOSIS — M5442 Lumbago with sciatica, left side: Secondary | ICD-10-CM | POA: Diagnosis not present

## 2020-05-25 DIAGNOSIS — R293 Abnormal posture: Secondary | ICD-10-CM

## 2020-05-25 NOTE — Therapy (Signed)
[facility] [place], [place], [address] Phone: [phone]   Fax:  [phone]  Physical Therapy Treatment  Patient Details  Name: Virginia Douglas MRN: [id number] Date of Birth: 1976/01/13 Referring Provider (PT): Metta Clines, DO   Encounter Date: 05/25/2020   PT End of Session - 05/25/20 1647    Visit Number 2    Number of Visits 12    Date for PT Re-Evaluation 07/06/20    PT Start Time 0402    PT Stop Time 0450    PT Time Calculation (min) 48 min    Activity Tolerance Patient tolerated treatment well    Behavior During Therapy [facility] for tasks assessed/performed           Past Medical History:  Diagnosis Date  . Anemia    iron deficiency  . Diabetes ([place])   . Edema   . Migraine headache     Past Surgical History:  Procedure Laterality Date  . ABDOMINAL HYSTERECTOMY    . CHOLECYSTECTOMY      There were no vitals filed for this visit.   Subjective Assessment - 05/25/20 1638    Subjective COVID-19 screen performed prior to patient entering clinic. Pain at a 10/10 in low back and left knee hurting a lot.    Pertinent History HTN, DM    Limitations Standing;Sitting;House hold activities    Currently in Pain? Yes    Pain Score 10-Worst pain ever    Pain Location Back    Pain Orientation Left    Pain Descriptors / Indicators Aching;Throbbing;Numbness    Pain Type Chronic pain    Pain Onset More than a month ago                             [facility] Adult PT Treatment/Exercise - 05/25/20 0001      Modalities   Modalities Electrical Stimulation;Moist Heat;Ultrasound      Moist Heat Therapy   Number Minutes Moist Heat 15 Minutes    Moist Heat Location Lumbar Spine      Electrical Stimulation   Electrical Stimulation Location Left LB    Electrical Stimulation Action Low-level Pre-mod at 80-150 Hz x 15 minutes.    Electrical Stimulation Goals Pain      Ultrasound   Ultrasound  Location Left low back    Ultrasound Parameters Patient in right sdly position with pillow between knees for comfort:  [place] at 1.50 W/CM2 x 12 minutes.      Manual Therapy   Manual Therapy Soft tissue mobilization    Soft tissue mobilization STW/M x 11 minutes to patient's left lower back musculature including QL.                       PT Long Term Goals - 05/18/20 2033      PT LONG TERM GOAL #1   Title Patient will be independent with HEP    Time 6    Period Weeks    Status New      PT LONG TERM GOAL #2   Title Patient will demonstrate 4/5 or greater L LE MMT to improve stability during functional tasks.    Time 6    Period Weeks    Status New      PT LONG TERM GOAL #3   Title Patient will report a centralization or elimiation  of L neurological symptoms to indicate decreased nerve irritation.  Time 6    Period Weeks    Status New      PT LONG TERM GOAL #4   Title Patient will report ability to perform ADLs, home activities and work activities with low back pain less than or equal to 3/10.    Time 6    Period Weeks    Status New      PT LONG TERM GOAL #5   Title Patient will report ability to sit for 1 hr or greater with low back pain less than or equal to 3/10 to perform work activities.    Time 6    Period Weeks    Status New                 Plan - 05/25/20 1642    Clinical Impression Statement Patient reporting a 10/10 pain-level upon presenting to the clinic today in her left low back region and left knee.  Patient was very tender over her L5-S1 region and left QL.  Patient tolerated treatment well today.  Normal modality response upon removal of modality.    Personal Factors and Comorbidities Comorbidity 2;Time since onset of injury/illness/exacerbation    Comorbidities HTN, DM    Examination-Activity Limitations Dressing;Bathing;Locomotion Level;Transfers;Sit;Sleep;Stand;Stairs    Examination-Participation Restrictions Occupation     Stability/Clinical Decision Making Stable/Uncomplicated    Rehab Potential Fair    PT Frequency 2x / week    PT Duration 6 weeks    PT Treatment/Interventions ADLs/Self Care Home Management;Cryotherapy;Electrical Stimulation;Ultrasound;Traction;Moist Heat;Gait training;Stair training;Functional mobility training;Therapeutic activities;Therapeutic exercise;Balance training;Neuromuscular re-education;Manual techniques;Passive range of motion;Patient/family education    PT Next Visit Plan nustep, core stability and strengthening, LE strengthening; STW/M, combo to left low back; modalities PRN for pain relief    Consulted and Agree with Plan of Care Patient           Patient will benefit from skilled therapeutic intervention in order to improve the following deficits and impairments:  Abnormal gait, Decreased activity tolerance, Decreased mobility, Decreased strength, Postural dysfunction, Pain, Difficulty walking, Decreased range of motion  Visit Diagnosis: Chronic left-sided low back pain with left-sided sciatica  Muscle weakness (generalized)  Abnormal posture     Problem List Patient Active Problem List   Diagnosis Date Noted  . Obesity (BMI 30-39.9) 03/15/2020  . Sciatic nerve pain 12/10/2019  . Acute pain of left shoulder 06/05/2019  . Grief 06/05/2019  . Migraine 06/05/2019  . Type 2 diabetes mellitus without complication, without long-term current use of insulin ([place]) 04/07/2019  . Depression 04/07/2019  . Obesity due to excess calories without serious comorbidity 04/07/2019  . Left leg pain 04/07/2019  . Anxiety 04/07/2019  . Headache 07/06/2018  . Diabetes ([place]) 12/08/2015  . Essential hypertension 12/08/2015  . Smoker 12/08/2015  . LLQ pain 12/08/2015  . Paresthesia 09/16/2015  . Urinary urgency 09/16/2015    Virginia Douglas, Virginia Douglas 05/25/2020, 4:57 PM  [facility] [address] [place], [place], [address] Phone:  [phone]   Fax:  [phone]  Name: Virginia Douglas MRN: [id number] Date of Birth: [birth year]

## 2020-05-25 NOTE — Telephone Encounter (Signed)
I called patient to see if she would be ok going to a weight loss center due to her not being able to take contrave. She declined at this time. YL,RMA

## 2020-05-26 ENCOUNTER — Other Ambulatory Visit: Payer: Self-pay | Admitting: Nurse Practitioner

## 2020-05-26 DIAGNOSIS — I10 Essential (primary) hypertension: Secondary | ICD-10-CM

## 2020-05-27 ENCOUNTER — Other Ambulatory Visit: Payer: Self-pay

## 2020-05-27 ENCOUNTER — Ambulatory Visit: Payer: No Typology Code available for payment source | Admitting: Physical Therapy

## 2020-05-27 ENCOUNTER — Encounter: Payer: Self-pay | Admitting: Physical Therapy

## 2020-05-27 DIAGNOSIS — M5416 Radiculopathy, lumbar region: Secondary | ICD-10-CM

## 2020-05-27 DIAGNOSIS — G8929 Other chronic pain: Secondary | ICD-10-CM

## 2020-05-27 DIAGNOSIS — M5442 Lumbago with sciatica, left side: Secondary | ICD-10-CM | POA: Diagnosis not present

## 2020-05-27 DIAGNOSIS — R293 Abnormal posture: Secondary | ICD-10-CM

## 2020-05-27 DIAGNOSIS — M6281 Muscle weakness (generalized): Secondary | ICD-10-CM

## 2020-05-27 NOTE — Therapy (Addendum)
[facility] [place], [place], [address] Phone: [phone]   Fax:  [phone]  Physical Therapy Treatment PHYSICAL THERAPY DISCHARGE SUMMARY  Visits from Start of Care: 3  Current functional level related to goals / functional outcomes: See below   Remaining deficits: See goals   Education / Equipment: HEP Plan: Patient agrees to discharge.  Patient goals were not met. Patient is being discharged due to not returning since the last visit.  ?????      Patient Details  Name: Virginia Douglas MRN: [id number] Date of Birth: [birth year] Referring Provider (PT): Metta Clines, DO   Encounter Date: 05/27/2020   PT End of Session - 05/27/20 1603    Visit Number 3    Number of Visits 12    Date for PT Re-Evaluation 07/06/20    PT Start Time 1603    PT Stop Time 1646    PT Time Calculation (min) 43 min    Activity Tolerance Patient tolerated treatment well    Behavior During Therapy [facility] for tasks assessed/performed           Past Medical History:  Diagnosis Date  . Anemia    iron deficiency  . Diabetes ([place])   . Edema   . Migraine headache     Past Surgical History:  Procedure Laterality Date  . ABDOMINAL HYSTERECTOMY    . CHOLECYSTECTOMY      There were no vitals filed for this visit.   Subjective Assessment - 05/27/20 1602    Subjective COVID-19 screen performed prior to patient entering clinic. Pain at a 10/10 in low back and left knee hurting a lot.    Pertinent History HTN, DM    Limitations Standing;Sitting;House hold activities    How long can you sit comfortably? 45 mins    How long can you stand comfortably? 30 mins    How long can you walk comfortably? longer distances increases pain    Diagnostic tests MRI: bulging discs; see media for full report    Patient Stated Goals decrease pain    Currently in Pain? Yes    Pain Score 10-Worst pain ever    Pain Location Back    Pain Orientation  Left;Lower;Right    Pain Descriptors / Indicators Discomfort    Pain Type Chronic pain    Pain Radiating Towards LLE    Pain Onset More than a month ago    Pain Frequency Constant              [facility] PT Assessment - 05/27/20 0001      Assessment   Medical Diagnosis Lumbar Radiculopathy    Referring Provider (PT) Metta Clines, DO    Next MD Visit "6 months"    Prior Therapy no      Precautions   Precautions None      Restrictions   Weight Bearing Restrictions No                         [facility] Adult PT Treatment/Exercise - 05/27/20 0001      Modalities   Modalities Electrical Stimulation;Moist Heat;Ultrasound      Moist Heat Therapy   Number Minutes Moist Heat 15 Minutes    Moist Heat Location Lumbar Spine      Electrical Stimulation   Electrical Stimulation Location B low back    Electrical Stimulation Action Pre-Mod    Electrical Stimulation Parameters 80-150 hz x15 min  Electrical Stimulation Goals Pain;Tone      Ultrasound   Ultrasound Location L lumbar paraspinals    Ultrasound Parameters Combo 1.5 w/cm2, 100%, 1 mhz x10 min   patient in R SL   Ultrasound Goals Pain      Manual Therapy   Manual Therapy Soft tissue mobilization    Soft tissue mobilization STW/MFR to L lumbar paraspinals and QL to reduce tone and pain                       PT Long Term Goals - 05/27/20 1656      PT LONG TERM GOAL #1   Title Patient will be independent with HEP    Time 6    Period Weeks    Status On-going      PT LONG TERM GOAL #2   Title Patient will demonstrate 4/5 or greater L LE MMT to improve stability during functional tasks.    Time 6    Period Weeks    Status On-going      PT LONG TERM GOAL #3   Title Patient will report a centralization or elimiation  of L neurological symptoms to indicate decreased nerve irritation.    Time 6    Period Weeks    Status On-going      PT LONG TERM GOAL #4   Title Patient will report ability to  perform ADLs, home activities and work activities with low back pain less than or equal to 3/10.    Time 6    Period Weeks    Status On-going      PT LONG TERM GOAL #5   Title Patient will report ability to sit for 1 hr or greater with low back pain less than or equal to 3/10 to perform work activities.    Time 6    Period Weeks    Status On-going                 Plan - 05/27/20 1649    Clinical Impression Statement Patient presented in clinic with reports of 10/10 LBP across lumbar spine but especially L low back. Patient continues to have lateral LE and medial L knee pain to L foot. Patient presented with increased tone of L lumbar paraspinals with moderate release following manual therapy. Normal modalities response noted following removal of the modalities.    Personal Factors and Comorbidities Comorbidity 2;Time since onset of injury/illness/exacerbation    Comorbidities HTN, DM    Examination-Activity Limitations Dressing;Bathing;Locomotion Level;Transfers;Sit;Sleep;Stand;Stairs    Examination-Participation Restrictions Occupation    Stability/Clinical Decision Making Stable/Uncomplicated    Rehab Potential Fair    PT Frequency 2x / week    PT Duration 6 weeks    PT Treatment/Interventions ADLs/Self Care Home Management;Cryotherapy;Electrical Stimulation;Ultrasound;Traction;Moist Heat;Gait training;Stair training;Functional mobility training;Therapeutic activities;Therapeutic exercise;Balance training;Neuromuscular re-education;Manual techniques;Passive range of motion;Patient/family education    PT Next Visit Plan nustep, core stability and strengthening, LE strengthening; STW/M, combo to left low back; modalities PRN for pain relief    PT Home Exercise Plan see patient education section    Consulted and Agree with Plan of Care Patient           Patient will benefit from skilled therapeutic intervention in order to improve the following deficits and impairments:   Abnormal gait, Decreased activity tolerance, Decreased mobility, Decreased strength, Postural dysfunction, Pain, Difficulty walking, Decreased range of motion  Visit Diagnosis: Chronic left-sided low back pain with left-sided sciatica  Muscle weakness (  generalized)  Abnormal posture     Problem List Patient Active Problem List   Diagnosis Date Noted  . Obesity (BMI 30-39.9) 03/15/2020  . Sciatic nerve pain 12/10/2019  . Acute pain of left shoulder 06/05/2019  . Grief 06/05/2019  . Migraine 06/05/2019  . Type 2 diabetes mellitus without complication, without long-term current use of insulin ([place]) 04/07/2019  . Depression 04/07/2019  . Obesity due to excess calories without serious comorbidity 04/07/2019  . Left leg pain 04/07/2019  . Anxiety 04/07/2019  . Headache 07/06/2018  . Diabetes ([place]) 12/08/2015  . Essential hypertension 12/08/2015  . Smoker 12/08/2015  . LLQ pain 12/08/2015  . Paresthesia 09/16/2015  . Urinary urgency 09/16/2015    Standley Brooking, PTA 05/27/2020, 4:57 PM  [facility] [address] [place], [place], [address] Phone: [phone]   Fax:  [phone]  Name: Deletha Jaffee MRN: [id number] Date of Birth: June 01, 1976

## 2020-06-01 ENCOUNTER — Telehealth: Payer: Self-pay

## 2020-06-01 NOTE — Telephone Encounter (Signed)
Message left by Virginia Douglas With [facility]; Please fax over MRI Results.   Mri of Lumbar spine faxed over to [phone]

## 2020-06-02 ENCOUNTER — Encounter: Payer: Self-pay | Admitting: Nurse Practitioner

## 2020-06-02 ENCOUNTER — Ambulatory Visit (INDEPENDENT_AMBULATORY_CARE_PROVIDER_SITE_OTHER): Payer: No Typology Code available for payment source | Admitting: Nurse Practitioner

## 2020-06-02 ENCOUNTER — Other Ambulatory Visit: Payer: Self-pay

## 2020-06-02 VITALS — BP 124/80 | HR 98 | Temp 97.6°F | Ht 66.6 in | Wt 199.4 lb

## 2020-06-02 DIAGNOSIS — M25472 Effusion, left ankle: Secondary | ICD-10-CM

## 2020-06-02 DIAGNOSIS — E669 Obesity, unspecified: Secondary | ICD-10-CM

## 2020-06-02 DIAGNOSIS — R0789 Other chest pain: Secondary | ICD-10-CM | POA: Diagnosis not present

## 2020-06-02 DIAGNOSIS — E119 Type 2 diabetes mellitus without complications: Secondary | ICD-10-CM | POA: Diagnosis not present

## 2020-06-02 DIAGNOSIS — F419 Anxiety disorder, unspecified: Secondary | ICD-10-CM

## 2020-06-02 DIAGNOSIS — Z23 Encounter for immunization: Secondary | ICD-10-CM | POA: Diagnosis not present

## 2020-06-02 DIAGNOSIS — F909 Attention-deficit hyperactivity disorder, unspecified type: Secondary | ICD-10-CM

## 2020-06-02 MED ORDER — ATORVASTATIN CALCIUM 10 MG PO TABS: 10.0000 mg | ORAL_TABLET | Freq: Every day | ORAL | 1 refills | Status: DC

## 2020-06-02 NOTE — Progress Notes (Signed)
This visit occurred during the SARS-CoV-2 public health emergency.  Safety protocols were in place, including screening questions prior to the visit, additional usage of staff PPE, and extensive cleaning of exam room while observing appropriate contact time as indicated for disinfecting solutions.  Subjective:     Patient ID: Virginia Douglas , female    DOB: August 11, 1976 , 44 y.o.   MRN: [id number]   Chief Complaint  Patient presents with  . Weight Check    HPI  Left ankle swelling - started about 1 year ago. This is worse after working during the day. Denies having pain. Does admit to wearing flat shoes and not well supported shoes.  She does have bulging disc to her lumbar spine with radiculopathy.    Here weight at home was 196 lbs  She does report intermittent sharp chest pain lasting approximately 5 seconds occurring approximately every 2 weeks. Has not had to take any medications for the discomfort.  Reports "just sitting watching TV". She does report she feels her "nerves" are not good, no longer seeing the counselor but feels she needs to start back.      Past Medical History:  Diagnosis Date  . Anemia    iron deficiency  . Diabetes ([place])   . Edema   . Migraine headache      Family History  Problem Relation Age of Onset  . Diabetes Mother   . Hypertension Mother   . Diabetes Father   . Hypertension Father   . Kidney failure Father   . Hypertension Brother   . Diabetes Maternal Grandmother   . Breast cancer Maternal Grandmother   . Diabetes Paternal Grandmother   . Breast cancer Paternal Grandmother   . Cancer Maternal Aunt        breast  . Breast cancer Maternal Aunt      Current Outpatient Medications:  .  acetaZOLAMIDE (DIAMOX) 250 MG tablet, TAKE 1 TABLET BY MOUTH TWICE A DAY, Disp: 180 tablet, Rfl: 1 .  AIMOVIG 140 MG/ML SOAJ, INJECT 140 MG INTO THE SKIN EVERY 30 DAYS., Disp: 1 mL, Rfl: 3 .  amphetamine-dextroamphetamine (ADDERALL) 20 MG tablet,  Take 1 tablet (20 mg total) by mouth daily., Disp: 30 tablet, Rfl: 0 .  atorvastatin (LIPITOR) 10 MG tablet, Take 1 tablet (10 mg total) by mouth daily., Disp: 90 tablet, Rfl: 1 .  Blood Glucose Monitoring Suppl (ONETOUCH VERIO IQ SYSTEM) w/Device KIT, by Does not apply route. Use to check blood sugars twice, Disp: , Rfl:  .  busPIRone (BUSPAR) 5 MG tablet, TAKE 1 TABLET BY MOUTH TWICE A DAY, Disp: 180 tablet, Rfl: 1 .  clonazePAM (KLONOPIN) 0.5 MG tablet, TAKE 1 TABLET BY MOUTH THREE TIMES A DAY AS NEEDED FOR ANXIETY, Disp: 30 tablet, Rfl: 1 .  benzonatate (TESSALON) 200 MG capsule, Take 1 capsule (200 mg total) by mouth 2 (two) times daily as needed for cough. (Patient not taking: Reported on 01/08/2020), Disp: 20 capsule, Rfl: 0 .  CONTRAVE 8-90 MG TB12, TAKE 2 TABLETS BY MOUTH IN THE MORNING AND AT BEDTIME. (Patient not taking: Reported on 03/15/2020), Disp: 120 tablet, Rfl: 1 .  DULoxetine (CYMBALTA) 30 MG capsule, Take 75m daily for one week, then 672mdaily., Disp: 60 capsule, Rfl: 0 .  glucose blood (ONETOUCH VERIO) test strip, 1 each by Other route as needed for other. Insert 1 by subcutaneous route 2 times every day check blood sugar before breakfast and dinner, Disp: , Rfl:  .  Insulin Pen Needle (PEN NEEDLES) 32G X 4 MM MISC, 1 each by Does not apply route once a week., Disp: 30 each, Rfl: 3 .  Lasmiditan Succinate (REYVOW) 100 MG TABS, Take 1 tablet by mouth daily as needed., Disp: 16 tablet, Rfl: 5 .  Lifitegrast (XIIDRA) 5 % SOLN, Place 1 drop into both eyes daily., Disp: , Rfl:  .  lisinopril-hydrochlorothiazide (ZESTORETIC) 10-12.5 MG tablet, TAKE 1 TABLET BY MOUTH EVERY DAY, Disp: 90 tablet, Rfl: 1 .  pregabalin (LYRICA) 100 MG capsule, Take 1 capsule (100 mg total) by mouth 3 (three) times daily. (Patient not taking: Reported on 05/18/2020), Disp: 90 capsule, Rfl: 6 .  SAXENDA 18 MG/3ML SOPN, INJECT 0.5 MLS (3 MG TOTAL) INTO THE SKIN DAILY., Disp: 9 mL, Rfl: 1 .  topiramate (TOPAMAX)  50 MG tablet, TAKE 2.5 TABLETS (125 MG TOTAL) BY MOUTH AT BEDTIME., Disp: 225 tablet, Rfl: 1 .  TURMERIC PO, Take 1 capsule by mouth daily., Disp: , Rfl:  .  Vitamin D, Ergocalciferol, (DRISDOL) 1.25 MG (50000 UNIT) CAPS capsule, TAKE 1 CAPSULE (50,000 UNITS TOTAL) BY MOUTH EVERY 7 (SEVEN) DAYS., Disp: 12 capsule, Rfl: 0   No Known Allergies   Review of Systems  Constitutional: Negative.   Respiratory: Negative.   Cardiovascular: Positive for chest pain (intermittent sharp pain to left side of chest lasting 5 seconds).  Psychiatric/Behavioral: Negative for agitation. The patient is nervous/anxious.      Today's Vitals   06/02/20 0943  BP: 124/80  Pulse: 98  Temp: 97.6 F (36.4 C)  TempSrc: Oral  Weight: 199 lb 6.4 oz (90.4 kg)  Height: 5' 6.6" (1.692 m)  PainSc: 8    Body mass index is 31.61 kg/m.   Objective:  Physical Exam Constitutional:      General: She is not in acute distress.    Appearance: Normal appearance.  Cardiovascular:     Rate and Rhythm: Normal rate and regular rhythm.     Pulses: Normal pulses.     Heart sounds: Normal heart sounds. No murmur heard.   Pulmonary:     Effort: Pulmonary effort is normal. No respiratory distress.     Breath sounds: Normal breath sounds.  Skin:    General: Skin is warm.  Neurological:     General: No focal deficit present.     Mental Status: She is alert and oriented to person, place, and time.     Cranial Nerves: No cranial nerve deficit.  Psychiatric:        Mood and Affect: Mood normal.        Behavior: Behavior normal.        Thought Content: Thought content normal.        Judgment: Judgment normal.         Assessment And Plan:     1. Type 2 diabetes mellitus without complication, without long-term current use of insulin (H[facility])  Chronic, stable  No labs due at this time - atorvastatin (LIPITOR) 10 MG tablet; Take 1 tablet (10 mg total) by mouth daily.  Dispense: 90 tablet; Refill: 1  2. Need for  influenza vaccination  Influenza vaccine administered  Encouraged to take Tylenol as needed for fever or muscle aches. - Flu Vaccine QUAD 6+ mos PF IM (Fluarix Quad PF)  3. Left ankle swelling  No swelling currently however continues to have episodes  Encouraged to wear support socks while at work or sneakers with a good support - VAS [place] ABI WITH/WO TBI; Future  4. Other chest pain - EKG 12-Lead  5. Anxiety   6. Attention deficit hyperactivity disorder (ADHD), unspecified ADHD type  Chronic, she is seeing a psychiatrist taking Adderral   7. Obesity (BMI 30-39.9)  Chronic, she is not currently taking a weight loss medication. Contrave is contraindicated due to taking Adderral, she is already on Ozempic which has a side effect of weight loss.  Mancel Parsons would be her best option however at this time insurance does not cover.  She has lost 11 lbs since starting her Adderral     Patient was given opportunity to ask questions. Patient verbalized understanding of the plan and was able to repeat key elements of the plan. All questions were answered to their satisfaction.   Teola Bradley, FNP, have reviewed all documentation for this visit. The documentation on 06/02/20 for the exam, diagnosis, procedures, and orders are all accurate and complete.  THE PATIENT IS ENCOURAGED TO PRACTICE SOCIAL DISTANCING DUE TO THE COVID-19 PANDEMIC.

## 2020-06-03 ENCOUNTER — Other Ambulatory Visit: Payer: Self-pay | Admitting: Neurology

## 2020-06-16 ENCOUNTER — Other Ambulatory Visit: Payer: Self-pay | Admitting: Nurse Practitioner

## 2020-06-17 ENCOUNTER — Other Ambulatory Visit: Payer: Self-pay

## 2020-06-17 ENCOUNTER — Ambulatory Visit (HOSPITAL_COMMUNITY)
Admission: RE | Admit: 2020-06-17 | Discharge: 2020-06-17 | Disposition: A | Discharge status: Home / Self Care | Payer: No Typology Code available for payment source | Source: Ambulatory Visit | Attending: Internal Medicine | Admitting: Internal Medicine

## 2020-06-17 DIAGNOSIS — M25472 Effusion, left ankle: Secondary | ICD-10-CM | POA: Diagnosis not present

## 2020-06-17 DIAGNOSIS — R600 Localized edema: Secondary | ICD-10-CM

## 2020-07-04 ENCOUNTER — Other Ambulatory Visit: Payer: Self-pay | Admitting: Nurse Practitioner

## 2020-07-15 ENCOUNTER — Encounter: Payer: Self-pay | Admitting: Nurse Practitioner

## 2020-07-15 ENCOUNTER — Ambulatory Visit (INDEPENDENT_AMBULATORY_CARE_PROVIDER_SITE_OTHER): Payer: No Typology Code available for payment source | Admitting: Nurse Practitioner

## 2020-07-15 ENCOUNTER — Other Ambulatory Visit: Payer: Self-pay

## 2020-07-15 VITALS — BP 122/68 | HR 101 | Temp 97.9°F | Ht 66.6 in | Wt 195.6 lb

## 2020-07-15 DIAGNOSIS — E119 Type 2 diabetes mellitus without complications: Secondary | ICD-10-CM | POA: Diagnosis not present

## 2020-07-15 DIAGNOSIS — Z0000 Encounter for general adult medical examination without abnormal findings: Secondary | ICD-10-CM | POA: Diagnosis not present

## 2020-07-15 DIAGNOSIS — G4709 Other insomnia: Secondary | ICD-10-CM | POA: Diagnosis not present

## 2020-07-15 DIAGNOSIS — E559 Vitamin D deficiency, unspecified: Secondary | ICD-10-CM

## 2020-07-15 DIAGNOSIS — I10 Essential (primary) hypertension: Secondary | ICD-10-CM | POA: Diagnosis not present

## 2020-07-15 DIAGNOSIS — F32A Depression, unspecified: Secondary | ICD-10-CM

## 2020-07-15 LAB — POCT URINALYSIS DIPSTICK
Bilirubin, UA: NEGATIVE
Blood, UA: NEGATIVE
Glucose, UA: NEGATIVE
Ketones, UA: NEGATIVE
Leukocytes, UA: NEGATIVE
Nitrite, UA: NEGATIVE
Protein, UA: NEGATIVE
Spec Grav, UA: 1.02 (ref 1.010–1.025)
Urobilinogen, UA: 1 E.U./dL
pH, UA: 7 (ref 5.0–8.0)

## 2020-07-15 LAB — POCT UA - MICROALBUMIN
Albumin/Creatinine Ratio, Urine, POC: 30
Creatinine, POC: 300 mg/dL
Microalbumin Ur, POC: 10 mg/L

## 2020-07-15 MED ORDER — HYDROXYZINE PAMOATE 50 MG PO CAPS: 50.0000 mg | ORAL_CAPSULE | Freq: Every evening | ORAL | 2 refills | Status: DC | PRN

## 2020-07-15 NOTE — Patient Instructions (Signed)
Health Maintenance, Female Adopting a healthy lifestyle and getting preventive care are important in promoting health and wellness. Ask your health care provider about:  The right schedule for you to have regular tests and exams.  Things you can do on your own to prevent diseases and keep yourself healthy. What should I know about diet, weight, and exercise? Eat a healthy diet   Eat a diet that includes plenty of vegetables, fruits, low-fat dairy products, and lean protein.  Do not eat a lot of foods that are high in solid fats, added sugars, or sodium. Maintain a healthy weight Body mass index (BMI) is used to identify weight problems. It estimates body fat based on height and weight. Your health care provider can help determine your BMI and help you achieve or maintain a healthy weight. Get regular exercise Get regular exercise. This is one of the most important things you can do for your health. Most adults should:  Exercise for at least 150 minutes each week. The exercise should increase your heart rate and make you sweat (moderate-intensity exercise).  Do strengthening exercises at least twice a week. This is in addition to the moderate-intensity exercise.  Spend less time sitting. Even light physical activity can be beneficial. Watch cholesterol and blood lipids Have your blood tested for lipids and cholesterol at 43 years of age, then have this test every 5 years. Have your cholesterol levels checked more often if:  Your lipid or cholesterol levels are high.  You are older than 43 years of age.  You are at high risk for heart disease. What should I know about cancer screening? Depending on your health history and family history, you may need to have cancer screening at various ages. This may include screening for:  Breast cancer.  Cervical cancer.  Colorectal cancer.  Skin cancer.  Lung cancer. What should I know about heart disease, diabetes, and high blood  pressure? Blood pressure and heart disease  High blood pressure causes heart disease and increases the risk of stroke. This is more likely to develop in people who have high blood pressure readings, are of African descent, or are overweight.  Have your blood pressure checked: ? Every 3-5 years if you are 18-39 years of age. ? Every year if you are 40 years old or older. Diabetes Have regular diabetes screenings. This checks your fasting blood sugar level. Have the screening done:  Once every three years after age 40 if you are at a normal weight and have a low risk for diabetes.  More often and at a younger age if you are overweight or have a high risk for diabetes. What should I know about preventing infection? Hepatitis B If you have a higher risk for hepatitis B, you should be screened for this virus. Talk with your health care provider to find out if you are at risk for hepatitis B infection. Hepatitis C Testing is recommended for:  Everyone born from 1945 through 1965.  Anyone with known risk factors for hepatitis C. Sexually transmitted infections (STIs)  Get screened for STIs, including gonorrhea and chlamydia, if: ? You are sexually active and are younger than 43 years of age. ? You are older than 43 years of age and your health care provider tells you that you are at risk for this type of infection. ? Your sexual activity has changed since you were last screened, and you are at increased risk for chlamydia or gonorrhea. Ask your health care provider if   you are at risk.  Ask your health care provider about whether you are at high risk for HIV. Your health care provider may recommend a prescription medicine to help prevent HIV infection. If you choose to take medicine to prevent HIV, you should first get tested for HIV. You should then be tested every 3 months for as long as you are taking the medicine. Pregnancy  If you are about to stop having your period (premenopausal) and  you may become pregnant, seek counseling before you get pregnant.  Take 400 to 800 micrograms (mcg) of folic acid every day if you become pregnant.  Ask for birth control (contraception) if you want to prevent pregnancy. Osteoporosis and menopause Osteoporosis is a disease in which the bones lose minerals and strength with aging. This can result in bone fractures. If you are 65 years old or older, or if you are at risk for osteoporosis and fractures, ask your health care provider if you should:  Be screened for bone loss.  Take a calcium or vitamin D supplement to lower your risk of fractures.  Be given hormone replacement therapy (HRT) to treat symptoms of menopause. Follow these instructions at home: Lifestyle  Do not use any products that contain nicotine or tobacco, such as cigarettes, e-cigarettes, and chewing tobacco. If you need help quitting, ask your health care provider.  Do not use street drugs.  Do not share needles.  Ask your health care provider for help if you need support or information about quitting drugs. Alcohol use  Do not drink alcohol if: ? Your health care provider tells you not to drink. ? You are pregnant, may be pregnant, or are planning to become pregnant.  If you drink alcohol: ? Limit how much you use to 0-1 drink a day. ? Limit intake if you are breastfeeding.  Be aware of how much alcohol is in your drink. In the [place] one drink equals one 12 oz bottle of beer (355 mL), one 5 oz glass of wine (148 mL), or one 1 oz glass of hard liquor (44 mL). General instructions  Schedule regular health, dental, and eye exams.  Stay current with your vaccines.  Tell your health care provider if: ? You often feel depressed. ? You have ever been abused or do not feel safe at home. Summary  Adopting a healthy lifestyle and getting preventive care are important in promoting health and wellness.  Follow your health care provider's instructions about healthy  diet, exercising, and getting tested or screened for diseases.  Follow your health care provider's instructions on monitoring your cholesterol and blood pressure. This information is not intended to replace advice given to you by your health care provider. Make sure you discuss any questions you have with your health care provider. Document Revised: 08/14/2018 Document Reviewed: 08/14/2018 Elsevier Patient Education  2020 [employer]  

## 2020-07-15 NOTE — Progress Notes (Signed)
I,Virginia Douglas as a Education administrator for [employer], FNP.,have documented all relevant documentation on the behalf of Virginia Brine, FNP,as directed by  Virginia Brine, FNP while in the presence of Virginia Douglas, Virginia Douglas. This visit occurred during the SARS-CoV-2 public health emergency.  Safety protocols were in place, including screening questions prior to the visit, additional usage of staff PPE, and extensive cleaning of exam room while observing appropriate contact time as indicated for disinfecting solutions.  Subjective:     Patient ID: Virginia Douglas , female    DOB: 02-19-1976 , 44 y.o.   MRN: [id number]   Chief Complaint  Patient presents with  . Annual Exam    HPI  Patient here for HM.  She is taking duloxetine, buspirone, and adderall (ADD) - she does see a big difference in work since taking adderall.  She has challenges with focusing on completing tasks mostly at home.   She has a trial coming up for her mothers murder by her family member.  She feels she may be stressed about that process.   Diabetes She presents for her follow-up diabetic visit. She has type 2 diabetes mellitus. Her disease course has been stable. There are no hypoglycemic associated symptoms. Pertinent negatives for hypoglycemia include no confusion, dizziness or nervousness/anxiousness. There are no diabetic associated symptoms. Pertinent negatives for diabetes include no chest pain, no fatigue, no polydipsia, no polyphagia and no polyuria. There are no hypoglycemic complications. Symptoms are stable. There are no diabetic complications. Risk factors for coronary artery disease include hypertension, diabetes mellitus, sedentary lifestyle and obesity. She is compliant with treatment all of the time. She is following a generally healthy diet. When asked about meal planning, she reported none. She has not had a previous visit with a dietitian. She rarely participates in exercise. There is no change in her  home blood glucose trend. (Not checking her blood sugar) An ACE inhibitor/angiotensin II receptor blocker is being taken. Eye exam current: she is seeing an opthalmologist.     Past Medical History:  Diagnosis Date  . Anemia    iron deficiency  . Diabetes (Virginia Douglas)   . Edema   . Migraine headache      Family History  Problem Relation Age of Onset  . Diabetes Mother   . Hypertension Mother   . Diabetes Father   . Hypertension Father   . Kidney failure Father   . Hypertension Brother   . Diabetes Maternal Grandmother   . Breast cancer Maternal Grandmother   . Diabetes Paternal Grandmother   . Breast cancer Paternal Grandmother   . Cancer Maternal Aunt        breast  . Breast cancer Maternal Aunt      Current Outpatient Medications:  .  acetaZOLAMIDE (DIAMOX) 250 MG tablet, TAKE 1 TABLET BY MOUTH TWICE A DAY, Disp: 180 tablet, Rfl: 1 .  amphetamine-dextroamphetamine (ADDERALL) 20 MG tablet, Take 1 tablet (20 mg total) by mouth daily., Disp: 30 tablet, Rfl: 0 .  atorvastatin (LIPITOR) 10 MG tablet, Take 1 tablet (10 mg total) by mouth daily., Disp: 90 tablet, Rfl: 1 .  Blood Glucose Monitoring Suppl (ONETOUCH VERIO IQ SYSTEM) w/Device KIT, by Does not apply route. Use to check blood sugars twice, Disp: , Rfl:  .  busPIRone (BUSPAR) 5 MG tablet, TAKE 1 TABLET BY MOUTH TWICE A DAY, Disp: 180 tablet, Rfl: 1 .  clonazePAM (KLONOPIN) 0.5 MG tablet, TAKE 1 TABLET BY MOUTH THREE TIMES A DAY AS NEEDED FOR  ANXIETY, Disp: 30 tablet, Rfl: 1 .  DULoxetine (CYMBALTA) 30 MG capsule, TAKE 1 CAPSULE BY MOUTH DAILY FOR ONE WEEK, THEN 2CAPSULES BY MOUTH DAILY., Disp: 180 capsule, Rfl: 1 .  glucose blood (ONETOUCH VERIO) test strip, 1 each by Other route as needed for other. Insert 1 by subcutaneous route 2 times every day check blood sugar before breakfast and dinner, Disp: , Rfl:  .  HYDROcodone-acetaminophen (NORCO/VICODIN) 5-325 MG tablet, Take 1 tablet by mouth every 6 (six) hours as needed for  moderate pain., Disp: , Rfl:  .  Insulin Pen Needle (PEN NEEDLES) 32G X 4 MM MISC, 1 each by Does not apply route once a week., Disp: 30 each, Rfl: 3 .  Lasmiditan Succinate (REYVOW) 100 MG TABS, Take 1 tablet by mouth daily as needed., Disp: 16 tablet, Rfl: 5 .  Lifitegrast (XIIDRA) 5 % SOLN, Place 1 drop into both eyes daily., Disp: , Rfl:  .  lisinopril-hydrochlorothiazide (ZESTORETIC) 10-12.5 MG tablet, TAKE 1 TABLET BY MOUTH EVERY DAY, Disp: 90 tablet, Rfl: 1 .  SAXENDA 18 MG/3ML SOPN, INJECT 0.5 MLS (3 MG TOTAL) INTO THE SKIN DAILY., Disp: 15 mL, Rfl: 1 .  topiramate (TOPAMAX) 50 MG tablet, TAKE 2.5 TABLETS (125 MG TOTAL) BY MOUTH AT BEDTIME., Disp: 225 tablet, Rfl: 1 .  TURMERIC PO, Take 1 capsule by mouth daily., Disp: , Rfl:  .  Vitamin D, Ergocalciferol, (DRISDOL) 1.25 MG (50000 UNIT) CAPS capsule, TAKE 1 CAPSULE (50,000 UNITS TOTAL) BY MOUTH EVERY 7 (SEVEN) DAYS., Disp: 12 capsule, Rfl: 0 .  AIMOVIG 140 MG/ML SOAJ, INJECT 140 MG INTO THE SKIN EVERY 30 DAYS., Disp: 1.12 mL, Rfl: 6 .  hydrOXYzine (VISTARIL) 50 MG capsule, Take 1 capsule (50 mg total) by mouth at bedtime as needed., Disp: 30 capsule, Rfl: 2   No Known Allergies    The patient states she is  status post hysterectomy. Negative for Dysmenorrhea and Negative for Menorrhagia. Negative for: breast discharge, breast lump(s), breast pain and breast self exam. Associated symptoms include abnormal vaginal bleeding. Pertinent negatives include abnormal bleeding (hematology), anxiety, decreased libido, depression, difficulty falling sleep, dyspareunia, history of infertility, nocturia, sexual dysfunction, sleep disturbances, urinary incontinence, urinary urgency, vaginal discharge and vaginal itching. Diet regular; she is trying to avoid sodas and bread (unless wheat), increased vegetables, she has cut back on her sweets.  The patient states her exercise level is none.  She is having back pain so this has limited her with her exercise.   She is drinking approximately 64 oz water a day. She is currently taking 3 mg Saxenda daily.   Wt Readings from Last 3 Encounters:  07/15/20 195 lb 9.6 oz (88.7 kg)  06/02/20 199 lb 6.4 oz (90.4 kg)  05/05/20 208 lb 12.8 oz (94.7 kg)    The patient's tobacco use is:  Social History   Tobacco Use  Smoking Status Former Smoker  . Packs/day: 0.50  . Years: 25.00  . Pack years: 12.50  . Types: Cigarettes  . Start date: 03/24/2019  Smokeless Tobacco Never Used   She has been exposed to passive smoke. The patient's alcohol use is:  Social History   Substance and Sexual Activity  Alcohol Use No  . Alcohol/week: 0.0 standard drinks   Additional information: Last pap hysterectomy.     Review of Systems  Constitutional: Negative.  Negative for fatigue.  HENT: Negative.   Eyes: Negative.   Respiratory: Negative.   Cardiovascular: Negative.  Negative for chest pain.  Gastrointestinal: Negative.   Endocrine: Negative.  Negative for polydipsia, polyphagia and polyuria.  Genitourinary: Negative.   Musculoskeletal: Negative.   Skin: Negative.   Allergic/Immunologic: Negative.   Neurological: Negative.  Negative for dizziness.  Hematological: Negative.   Psychiatric/Behavioral: Positive for sleep disturbance (she is going days without sleeping. She had her covid booster on Sun or Mon - so had been sleepy during that time.  she has tried over the counter sleep aids that are not effective. ). Negative for confusion. The patient is not nervous/anxious.      Today's Vitals   07/15/20 0918  BP: 122/68  Pulse: (!) 101  Temp: 97.9 F (36.6 C)  Weight: 195 lb 9.6 oz (88.7 kg)  Height: 5' 6.6" (1.692 m)  PainSc: 3    Body mass index is 31 kg/m.   Objective:  Physical Exam Constitutional:      General: She is not in acute distress.    Appearance: Normal appearance. She is well-developed. She is obese.  HENT:     Head: Normocephalic and atraumatic.     Right Ear: Hearing,  tympanic membrane, ear canal and external ear normal. There is no impacted cerumen.     Left Ear: Hearing, tympanic membrane, ear canal and external ear normal. There is no impacted cerumen.     Nose:     Comments: Deferred - masked    Mouth/Throat:     Comments: Deferred - masked Eyes:     General: Lids are normal.     Extraocular Movements: Extraocular movements intact.     Conjunctiva/sclera: Conjunctivae normal.     Pupils: Pupils are equal, round, and reactive to light.     Funduscopic exam:    Right eye: No papilledema.        Left eye: No papilledema.  Neck:     Thyroid: No thyroid mass.     Vascular: No carotid bruit.  Cardiovascular:     Rate and Rhythm: Normal rate and regular rhythm.     Pulses: Normal pulses.     Heart sounds: Normal heart sounds. No murmur heard.   Pulmonary:     Effort: Pulmonary effort is normal.     Breath sounds: Normal breath sounds.  Chest:     Chest wall: No mass.     Breasts: Tanner Score is 5.        Right: Normal. No mass or tenderness.        Left: Normal. No mass or tenderness.  Abdominal:     General: Abdomen is flat. Bowel sounds are normal. There is no distension.     Palpations: Abdomen is soft.     Tenderness: There is no abdominal tenderness.  Genitourinary:    Rectum: Guaiac result negative.  Musculoskeletal:        General: No swelling. Normal range of motion.     Cervical back: Full passive range of motion without pain, normal range of motion and neck supple.     Right lower leg: No edema.     Left lower leg: No edema.  Lymphadenopathy:     Upper Body:     Right upper body: No supraclavicular, axillary or pectoral adenopathy.     Left upper body: No supraclavicular, axillary or pectoral adenopathy.  Skin:    General: Skin is warm and dry.     Capillary Refill: Capillary refill takes less than 2 seconds.  Neurological:     General: No focal deficit present.     Mental  Status: She is alert and oriented to person,  place, and time.     Cranial Nerves: No cranial nerve deficit.     Sensory: No sensory deficit.  Psychiatric:        Mood and Affect: Mood normal.        Behavior: Behavior normal.        Thought Content: Thought content normal.        Judgment: Judgment normal.         Assessment And Plan:     1. Encounter for general adult medical examination w/o abnormal findings . Behavior modifications discussed and diet history reviewed.   . Pt will continue to exercise regularly and modify diet with low GI, plant based foods and decrease intake of processed foods.  . Recommend intake of daily multivitamin, Vitamin D, and calcium.  . Recommend for preventive screenings, as well as recommend immunizations that include influenza, TDAP - CBC  2. Essential hypertension . B/P is well controlled.  . CMP ordered to check renal function.  . The importance of regular exercise and dietary modification was stressed to the patient.   - POCT Urinalysis Dipstick (81002) - CMP14+EGFR  3. Type 2 diabetes mellitus without complication, without long-term current use of insulin (HCC)  Chronic, controlled  Continue with current medications  Encouraged to limit intake of sugary foods and drinks  Encouraged to increase physical activity to 150 minutes per week  Diabetic foot exam done, decreased sensation bilateral feet - POCT UA - Microalbumin - CMP14+EGFR - Hemoglobin A1c - Lipid panel  4. Other insomnia Will start her on vistaril to help her to sleep and anxiety - hydrOXYzine (VISTARIL) 50 MG capsule; Take 1 capsule (50 mg total) by mouth at bedtime as needed.  Dispense: 30 capsule; Refill: 2  5. Depression, unspecified depression type  She is currently going through a court case for the murder of her mother which has increased her depression  I have encouraged her to see her counselor especially during this time  54. Vitamin D deficiency  Will check vitamin D level and supplement as needed.      Also encouraged to spend 15 minutes in the sun daily.  - VITAMIN D 25 Hydroxy (Vit-D Deficiency, Fractures)     Patient was given opportunity to ask questions. Patient verbalized understanding of the plan and was able to repeat key elements of the plan. All questions were answered to their satisfaction.    Teola Bradley, FNP, have reviewed all documentation for this visit. The documentation on 08/03/20 for the exam, diagnosis, procedures, and orders are all accurate and complete.  THE PATIENT IS ENCOURAGED TO PRACTICE SOCIAL DISTANCING DUE TO THE COVID-19 PANDEMIC.

## 2020-07-16 LAB — CBC
Hematocrit: 42.7 % (ref 34.0–46.6)
Hemoglobin: 14.2 g/dL (ref 11.1–15.9)
MCH: 28.9 pg (ref 26.6–33.0)
MCHC: 33.3 g/dL (ref 31.5–35.7)
MCV: 87 fL (ref 79–97)
Platelets: 299 10*3/uL (ref 150–450)
RBC: 4.91 x10E6/uL (ref 3.77–5.28)
RDW: 14.4 % (ref 11.7–15.4)
WBC: 13.6 10*3/uL — ABNORMAL HIGH (ref 3.4–10.8)

## 2020-07-16 LAB — CMP14+EGFR
ALT: 22 IU/L (ref 0–32)
AST: 20 IU/L (ref 0–40)
Albumin/Globulin Ratio: 1.8 (ref 1.2–2.2)
Albumin: 4.9 g/dL — ABNORMAL HIGH (ref 3.8–4.8)
Alkaline Phosphatase: 115 IU/L (ref 44–121)
BUN/Creatinine Ratio: 10 (ref 9–23)
BUN: 9 mg/dL (ref 6–24)
Bilirubin Total: 0.4 mg/dL (ref 0.0–1.2)
CO2: 17 mmol/L — ABNORMAL LOW (ref 20–29)
Calcium: 10.2 mg/dL (ref 8.7–10.2)
Chloride: 101 mmol/L (ref 96–106)
Creatinine, Ser: 0.91 mg/dL (ref 0.57–1.00)
GFR calc Af Amer: 89 mL/min/{1.73_m2} (ref >59)
GFR calc non Af Amer: 78 mL/min/{1.73_m2} (ref >59)
Globulin, Total: 2.7 g/dL (ref 1.5–4.5)
Glucose: 97 mg/dL (ref 65–99)
Potassium: 3.6 mmol/L (ref 3.5–5.2)
Sodium: 137 mmol/L (ref 134–144)
Total Protein: 7.6 g/dL (ref 6.0–8.5)

## 2020-07-16 LAB — VITAMIN D 25 HYDROXY (VIT D DEFICIENCY, FRACTURES): Vit D, 25-Hydroxy: 45 ng/mL (ref 30.0–100.0)

## 2020-07-16 LAB — HEMOGLOBIN A1C
Est. average glucose Bld gHb Est-mCnc: 137 mg/dL
Hgb A1c MFr Bld: 6.4 % — ABNORMAL HIGH (ref 4.8–5.6)

## 2020-07-19 ENCOUNTER — Encounter: Payer: Self-pay | Admitting: Nurse Practitioner

## 2020-07-23 LAB — LIPID PANEL

## 2020-07-23 LAB — SPECIMEN STATUS REPORT

## 2020-08-01 ENCOUNTER — Other Ambulatory Visit: Payer: Self-pay | Admitting: Neurology

## 2020-08-03 ENCOUNTER — Encounter: Payer: Self-pay | Admitting: Nurse Practitioner

## 2020-08-06 ENCOUNTER — Other Ambulatory Visit: Payer: Self-pay | Admitting: Nurse Practitioner

## 2020-08-06 DIAGNOSIS — G4709 Other insomnia: Secondary | ICD-10-CM

## 2020-08-24 ENCOUNTER — Encounter: Payer: Self-pay | Admitting: Nurse Practitioner

## 2020-09-07 ENCOUNTER — Other Ambulatory Visit: Payer: Self-pay | Admitting: Neurology

## 2020-09-13 ENCOUNTER — Other Ambulatory Visit: Payer: Self-pay | Admitting: Nurse Practitioner

## 2020-09-18 ENCOUNTER — Ambulatory Visit: Payer: Self-pay

## 2020-09-18 ENCOUNTER — Ambulatory Visit (HOSPITAL_COMMUNITY): Payer: Self-pay

## 2020-09-22 ENCOUNTER — Other Ambulatory Visit: Payer: Self-pay | Admitting: Neurology

## 2020-09-22 NOTE — Telephone Encounter (Signed)
Please verify that she is still taking eletriptan.  If so, we can refill it

## 2020-09-29 ENCOUNTER — Encounter: Payer: Self-pay | Admitting: Nurse Practitioner

## 2020-09-29 ENCOUNTER — Other Ambulatory Visit: Payer: Self-pay

## 2020-09-29 ENCOUNTER — Ambulatory Visit
Admission: RE | Admit: 2020-09-29 | Discharge: 2020-09-29 | Disposition: A | Discharge status: Home / Self Care | Payer: No Typology Code available for payment source | Source: Ambulatory Visit | Attending: Nurse Practitioner | Admitting: Nurse Practitioner

## 2020-09-29 ENCOUNTER — Ambulatory Visit (INDEPENDENT_AMBULATORY_CARE_PROVIDER_SITE_OTHER): Payer: No Typology Code available for payment source | Admitting: Nurse Practitioner

## 2020-09-29 VITALS — BP 118/68 | HR 76 | Temp 98.0°F | Ht 66.6 in | Wt 205.0 lb

## 2020-09-29 DIAGNOSIS — M25561 Pain in right knee: Secondary | ICD-10-CM

## 2020-09-29 DIAGNOSIS — H6690 Otitis media, unspecified, unspecified ear: Secondary | ICD-10-CM | POA: Diagnosis not present

## 2020-09-29 IMAGING — CR DG KNEE COMPLETE 4+V*R*
4 series · 4 of 4 positions shown · non-contrast
Comparison: [DATE]

CLINICAL DATA: Knee pain and swelling

EXAM:
RIGHT KNEE - COMPLETE 4+ VIEW

[t knee ap right]
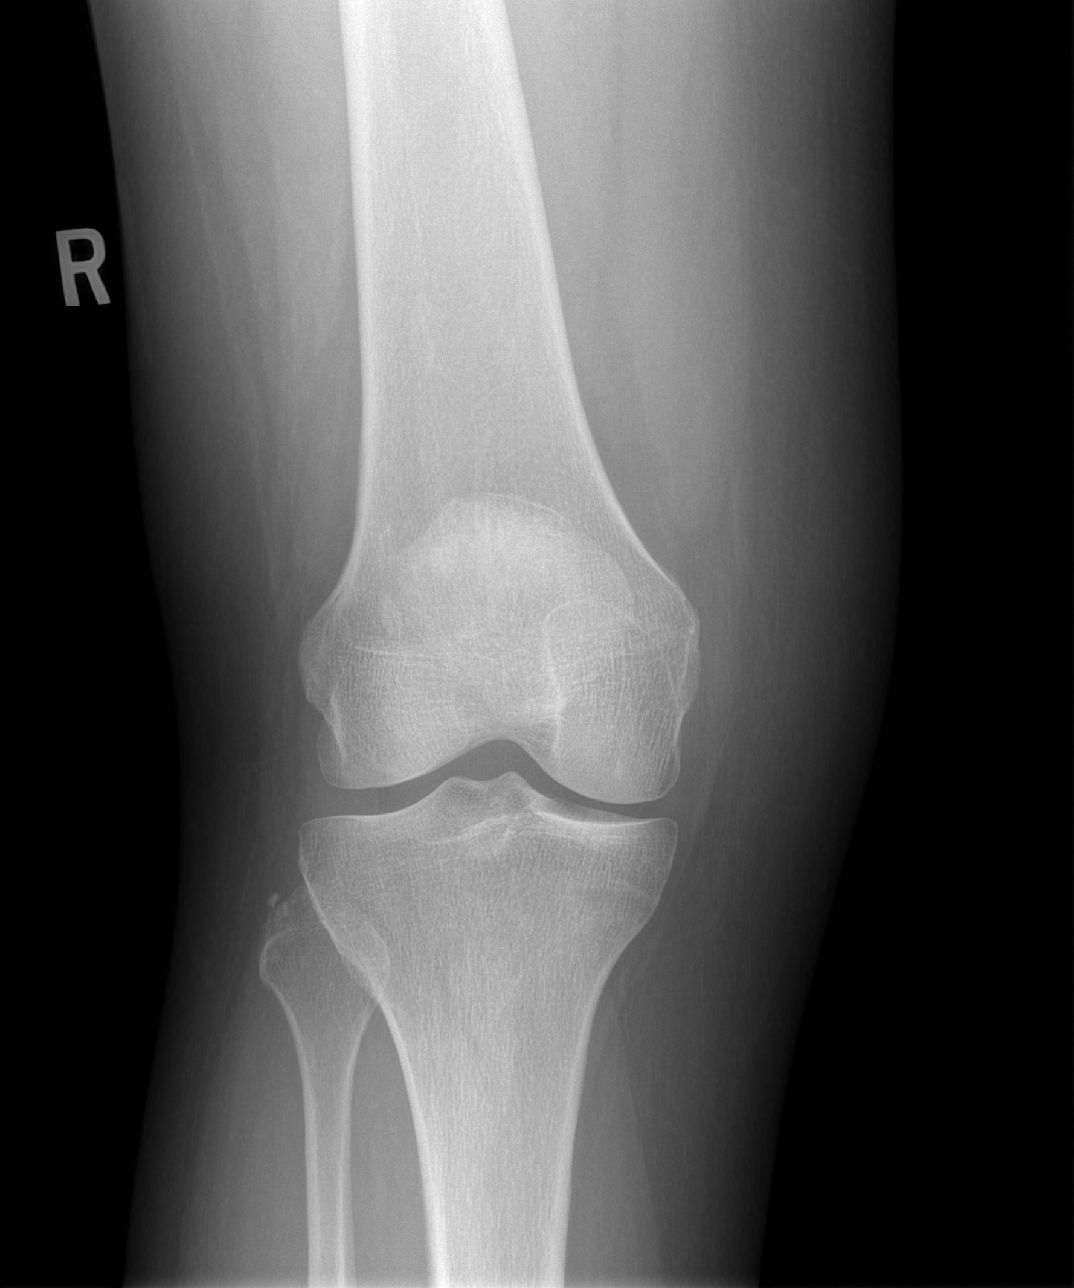

[t knee oblique right (1 of 2)]
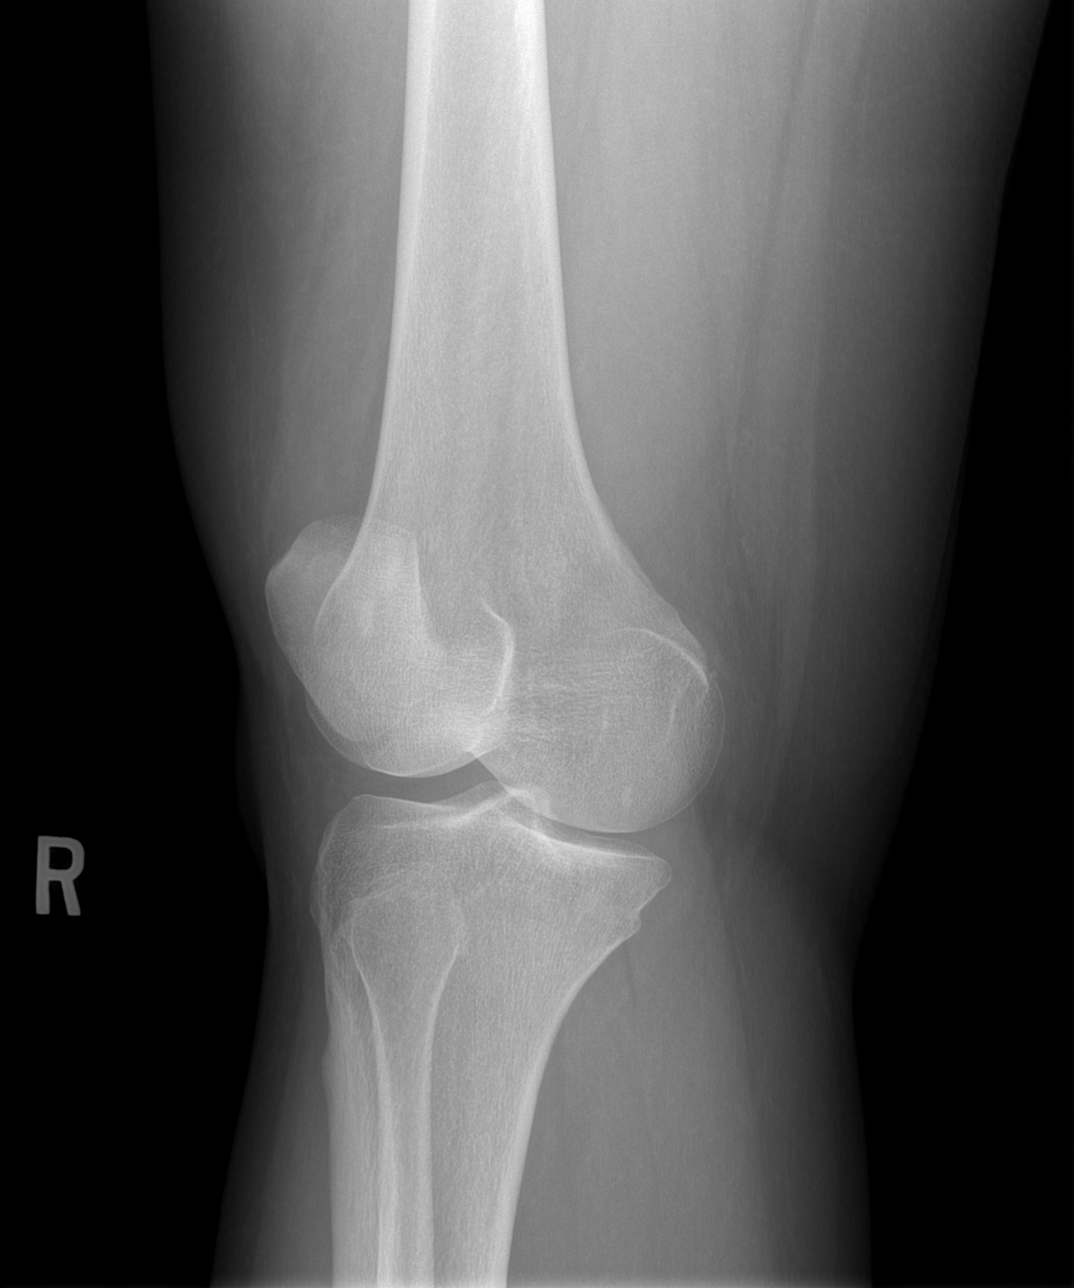

[t knee oblique right (2 of 2)]
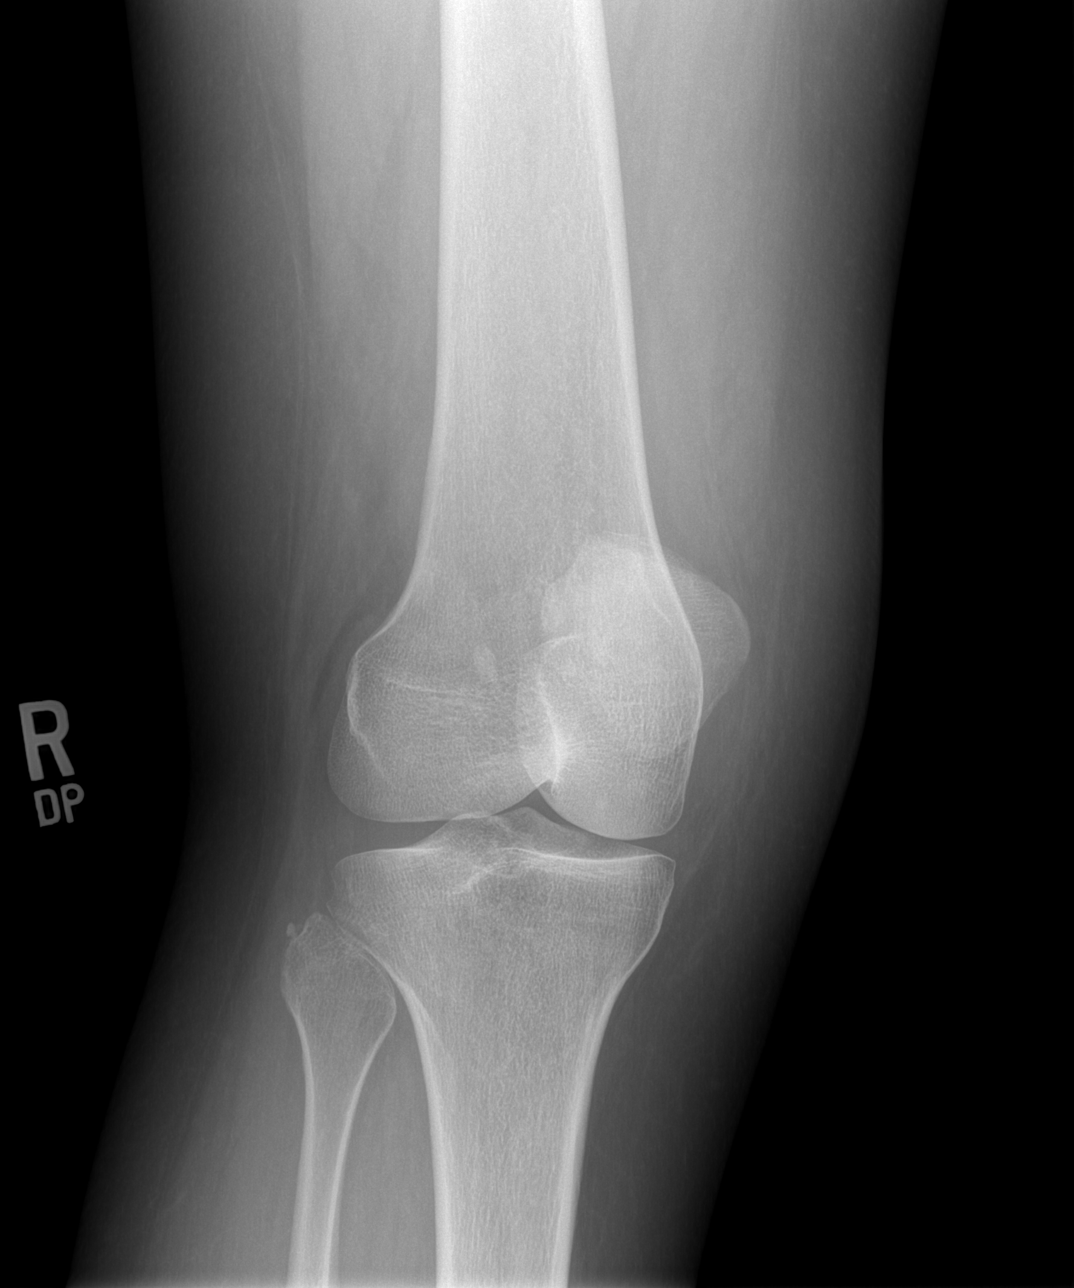

[t knee lat right]
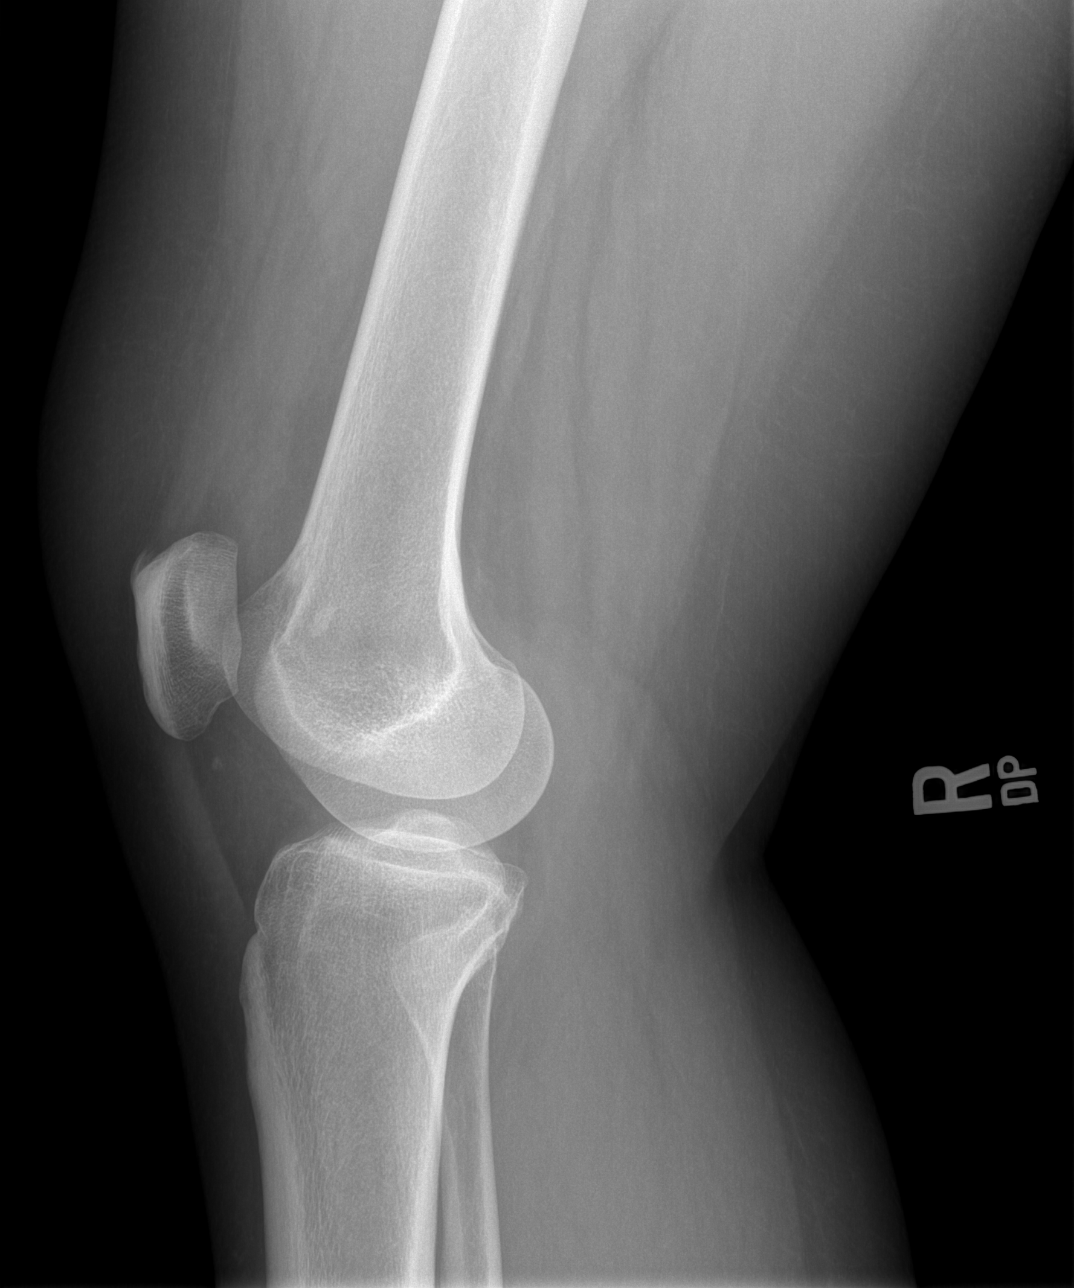

[4 of 4 positions shown; findings below may reference images not displayed]

FINDINGS: No evidence of fracture, dislocation, or joint effusion. No evidence
of arthropathy or other focal bone abnormality. Soft tissues are
unremarkable.
IMPRESSION: Negative.

## 2020-09-29 MED ORDER — AMOXICILLIN 875 MG PO TABS: 875.0000 mg | ORAL_TABLET | Freq: Two times a day (BID) | ORAL | 0 refills | Status: DC

## 2020-09-29 MED ORDER — KETOROLAC TROMETHAMINE 60 MG/2ML IM SOLN
60.0000 mg | Freq: Once | INTRAMUSCULAR | Status: AC
  Administered 2020-09-29: 60 mg via INTRAMUSCULAR

## 2020-09-29 NOTE — Progress Notes (Signed)
I,[name] as a Education administrator for [employer], FNP.,have documented all relevant documentation on the behalf of [name], FNP,as directed by  [name], FNP while in the presence of [name], [place]. This visit occurred during the SARS-CoV-2 public health emergency.  Safety protocols were in place, including screening questions prior to the visit, additional usage of staff PPE, and extensive cleaning of exam room while observing appropriate contact time as indicated for disinfecting solutions.  Subjective:     Patient ID: Virginia Douglas , female    DOB: [birth year] , 45 y.o.   MRN: [id number]   Chief Complaint  Patient presents with  . Knee Pain    Patient stated she has been having right knee pain since last week. She has a constant sharp pain . She said when she tried to bend her knee it hurt really bad     HPI  Patient presents today for right knee pain. Her knee pain is worse when going up. She is going to a spine provider for her back and knee pain.    Knee Pain  The incident occurred more than 1 week ago. Incident location: while squatting. The injury mechanism is unknown. The pain is present in the right knee. The quality of the pain is described as aching. The pain has been constant since onset. Pertinent negatives include no inability to bear weight, loss of motion, numbness or tingling. Nothing aggravates the symptoms. She has tried NSAIDs (voltaren gel) for the symptoms. The treatment provided moderate relief.     Past Medical History:  Diagnosis Date  . Anemia    iron deficiency  . Diabetes ([place])   . Edema   . Migraine headache      Family History  Problem Relation Age of Onset  . Diabetes Mother   . Hypertension Mother   . Diabetes Father   . Hypertension Father   . Kidney failure Father   . Hypertension Brother   . Diabetes Maternal Grandmother   . Breast cancer Maternal Grandmother   . Diabetes Paternal Grandmother   . Breast cancer  Paternal Grandmother   . Cancer Maternal Aunt        breast  . Breast cancer Maternal Aunt      Current Outpatient Medications:  .  acetaZOLAMIDE (DIAMOX) 250 MG tablet, TAKE 1 TABLET BY MOUTH TWICE A DAY, Disp: 180 tablet, Rfl: 0 .  AIMOVIG 140 MG/ML SOAJ, INJECT 140 MG INTO THE SKIN EVERY 30 DAYS., Disp: 1.12 mL, Rfl: 6 .  amoxicillin (AMOXIL) 875 MG tablet, Take 1 tablet (875 mg total) by mouth 2 (two) times daily., Disp: 14 tablet, Rfl: 0 .  amphetamine-dextroamphetamine (ADDERALL) 20 MG tablet, Take 1 tablet (20 mg total) by mouth daily., Disp: 30 tablet, Rfl: 0 .  atorvastatin (LIPITOR) 10 MG tablet, Take 1 tablet (10 mg total) by mouth daily., Disp: 90 tablet, Rfl: 1 .  Blood Glucose Monitoring Suppl (ONETOUCH VERIO IQ SYSTEM) w/Device KIT, by Does not apply route. Use to check blood sugars twice, Disp: , Rfl:  .  busPIRone (BUSPAR) 5 MG tablet, TAKE 1 TABLET BY MOUTH TWICE A DAY, Disp: 180 tablet, Rfl: 1 .  clonazePAM (KLONOPIN) 0.5 MG tablet, TAKE 1 TABLET BY MOUTH THREE TIMES A DAY AS NEEDED FOR ANXIETY, Disp: 30 tablet, Rfl: 1 .  DULoxetine (CYMBALTA) 30 MG capsule, TAKE 1 CAPSULE BY MOUTH DAILY FOR ONE WEEK, THEN 2CAPSULES BY MOUTH DAILY., Disp: 180 capsule, Rfl: 1 .  eletriptan (  RELPAX) 40 MG tablet, TAKE 1 TABLET AS NEEDED FOR MIGRAINE/ HEADACHE. MAY REPEAT IN 2 HRS IF HEADACHE PERSISTS OR RECURS., Disp: 10 tablet, Rfl: 1 .  glucose blood test strip, 1 each by Other route as needed for other. Insert 1 by subcutaneous route 2 times every day check blood sugar before breakfast and dinner, Disp: , Rfl:  .  HYDROcodone-acetaminophen (NORCO/VICODIN) 5-325 MG tablet, Take 1 tablet by mouth every 6 (six) hours as needed for moderate pain., Disp: , Rfl:  .  hydrOXYzine (VISTARIL) 50 MG capsule, TAKE 1 CAPSULE (50 MG TOTAL) BY MOUTH AT BEDTIME AS NEEDED., Disp: 90 capsule, Rfl: 1 .  Insulin Pen Needle (PEN NEEDLES) 32G X 4 MM MISC, 1 each by Does not apply route once a week., Disp: 30 each,  Rfl: 3 .  Lasmiditan Succinate (REYVOW) 100 MG TABS, Take 1 tablet by mouth daily as needed., Disp: 16 tablet, Rfl: 5 .  Lifitegrast (XIIDRA) 5 % SOLN, Place 1 drop into both eyes daily., Disp: , Rfl:  .  lisinopril-hydrochlorothiazide (ZESTORETIC) 10-12.5 MG tablet, TAKE 1 TABLET BY MOUTH EVERY DAY, Disp: 90 tablet, Rfl: 1 .  SAXENDA 18 MG/3ML SOPN, INJECT 0.5 MLS (3 MG TOTAL) INTO THE SKIN DAILY., Disp: 15 mL, Rfl: 1 .  TURMERIC PO, Take 1 capsule by mouth daily., Disp: , Rfl:  .  Vitamin D, Ergocalciferol, (DRISDOL) 1.25 MG (50000 UNIT) CAPS capsule, TAKE 1 CAPSULE (50,000 UNITS TOTAL) BY MOUTH EVERY 7 (SEVEN) DAYS., Disp: 12 capsule, Rfl: 0 .  ADDERALL XR 30 MG 24 hr capsule, Take 30 mg by mouth at bedtime., Disp: , Rfl:  .  famotidine (PEPCID) 20 MG tablet, Take 1 tablet (20 mg total) by mouth 2 (two) times daily., Disp: 10 tablet, Rfl: 0 .  HYDROcodone-acetaminophen (NORCO) 10-325 MG tablet, Take 1 tablet by mouth 5 (five) times daily., Disp: , Rfl:  .  topiramate (TOPAMAX) 50 MG tablet, TAKE 2 AND 1/2 TABLETS (125 MG TOTAL) BY MOUTH AT BEDTIME., Disp: 225 tablet, Rfl: 0   No Known Allergies   Review of Systems  Constitutional: Negative.   HENT: Positive for ear pain.   Respiratory: Negative.   Cardiovascular: Negative.  Negative for chest pain, palpitations and leg swelling.  Musculoskeletal: Positive for joint swelling (right knee ).  Neurological: Negative for dizziness, tingling, numbness and headaches.     Today's Vitals   09/29/20 1213  BP: 118/68  Pulse: 76  Temp: 98 F (36.7 C)  TempSrc: Oral  Weight: 205 lb (93 kg)  Height: 5' 6.6" (1.692 m)  PainSc: 10-Worst pain ever  PainLoc: Knee   Body mass index is 32.49 kg/m.   Objective:  Physical Exam Constitutional:      General: She is not in acute distress.    Appearance: Normal appearance.  Cardiovascular:     Pulses: Normal pulses.     Heart sounds: Normal heart sounds. No murmur heard.   Pulmonary:      Effort: No respiratory distress.     Breath sounds: No wheezing.  Musculoskeletal:        General: Swelling (right knee) and tenderness (right medial and lateral knee) present.     Comments: Decreased ROM with movement  Neurological:     General: No focal deficit present.     Mental Status: She is alert and oriented to person, place, and time.  Psychiatric:        Mood and Affect: Mood normal.  Behavior: Behavior normal.        Thought Content: Thought content normal.        Judgment: Judgment normal.         Assessment And Plan:     1. Acute pain of right knee  Tenderness medial and lateral knee with swelling - DG Knee Complete 4 Views Right; Future - ketorolac (TORADOL) injection 60 mg  2. Acute otitis media, unspecified otitis media type  Erythema to canal will treat with amoxicillin - amoxicillin (AMOXIL) 875 MG tablet; Take 1 tablet (875 mg total) by mouth 2 (two) times daily.  Dispense: 14 tablet; Refill: 0     Patient was given opportunity to ask questions. Patient verbalized understanding of the plan and was able to repeat key elements of the plan. All questions were answered to their satisfaction.  [name], FNP   I, [name], FNP, have reviewed all documentation for this visit. The documentation on 10/25/20 for the exam, diagnosis, procedures, and orders are all accurate and complete.   THE PATIENT IS ENCOURAGED TO PRACTICE SOCIAL DISTANCING DUE TO THE COVID-19 PANDEMIC.

## 2020-09-30 ENCOUNTER — Encounter: Payer: Self-pay | Admitting: Nurse Practitioner

## 2020-10-01 ENCOUNTER — Encounter: Payer: Self-pay | Admitting: Nurse Practitioner

## 2020-10-02 ENCOUNTER — Encounter: Payer: Self-pay | Admitting: Nurse Practitioner

## 2020-10-02 DIAGNOSIS — M2550 Pain in unspecified joint: Secondary | ICD-10-CM

## 2020-10-04 ENCOUNTER — Other Ambulatory Visit: Payer: Self-pay | Admitting: Nurse Practitioner

## 2020-10-04 DIAGNOSIS — M2550 Pain in unspecified joint: Secondary | ICD-10-CM

## 2020-10-04 NOTE — Telephone Encounter (Signed)
Called patient to inquire about her concerns of her multiple joint pain and she reports having intermittent swelling to her lower extremities.  I have ordered labs to check for inflammation and will refer to Rheumatology as necessary.

## 2020-10-05 ENCOUNTER — Other Ambulatory Visit: Payer: Self-pay

## 2020-10-05 ENCOUNTER — Other Ambulatory Visit: Payer: No Typology Code available for payment source

## 2020-10-05 DIAGNOSIS — M2550 Pain in unspecified joint: Secondary | ICD-10-CM

## 2020-10-06 ENCOUNTER — Encounter: Payer: Self-pay | Admitting: Nurse Practitioner

## 2020-10-06 LAB — RHEUMATOID FACTOR: Rheumatoid fact SerPl-aCnc: <10 IU/mL (ref <14.0)

## 2020-10-06 LAB — SEDIMENTATION RATE: Sed Rate: 13 mm/hr (ref 0–32)

## 2020-10-06 LAB — AUTOIMMUNE PROFILE
Anti Nuclear Antibody (ANA): NEGATIVE
Complement C3, Serum: 187 mg/dL — ABNORMAL HIGH (ref 82–167)
dsDNA Ab: <1 IU/mL (ref 0–9)

## 2020-10-06 LAB — C-REACTIVE PROTEIN: CRP: 10 mg/L (ref 0–10)

## 2020-10-12 ENCOUNTER — Encounter (HOSPITAL_COMMUNITY): Payer: Self-pay | Admitting: Emergency Medicine

## 2020-10-12 ENCOUNTER — Emergency Department (HOSPITAL_COMMUNITY): Payer: No Typology Code available for payment source

## 2020-10-12 ENCOUNTER — Emergency Department (HOSPITAL_COMMUNITY)
Admission: EM | Admit: 2020-10-12 | Discharge: 2020-10-12 | Disposition: A | Discharge status: Home / Self Care | Payer: No Typology Code available for payment source | Attending: Emergency Medicine | Admitting: Emergency Medicine

## 2020-10-12 ENCOUNTER — Other Ambulatory Visit: Payer: Self-pay

## 2020-10-12 DIAGNOSIS — Z79899 Other long term (current) drug therapy: Secondary | ICD-10-CM | POA: Insufficient documentation

## 2020-10-12 DIAGNOSIS — R0789 Other chest pain: Secondary | ICD-10-CM

## 2020-10-12 DIAGNOSIS — Z87891 Personal history of nicotine dependence: Secondary | ICD-10-CM | POA: Diagnosis not present

## 2020-10-12 DIAGNOSIS — K2970 Gastritis, unspecified, without bleeding: Secondary | ICD-10-CM | POA: Diagnosis not present

## 2020-10-12 DIAGNOSIS — I10 Essential (primary) hypertension: Secondary | ICD-10-CM | POA: Diagnosis not present

## 2020-10-12 DIAGNOSIS — E119 Type 2 diabetes mellitus without complications: Secondary | ICD-10-CM | POA: Insufficient documentation

## 2020-10-12 DIAGNOSIS — Z7984 Long term (current) use of oral hypoglycemic drugs: Secondary | ICD-10-CM | POA: Insufficient documentation

## 2020-10-12 DIAGNOSIS — R079 Chest pain, unspecified: Secondary | ICD-10-CM | POA: Diagnosis present

## 2020-10-12 DIAGNOSIS — K2900 Acute gastritis without bleeding: Secondary | ICD-10-CM

## 2020-10-12 LAB — HEPATIC FUNCTION PANEL
ALT: 18 U/L (ref 0–44)
AST: 21 U/L (ref 15–41)
Albumin: 4.4 g/dL (ref 3.5–5.0)
Alkaline Phosphatase: 107 U/L (ref 38–126)
Bilirubin, Direct: 0.1 mg/dL (ref 0.0–0.2)
Indirect Bilirubin: 0.3 mg/dL (ref 0.3–0.9)
Total Bilirubin: 0.4 mg/dL (ref 0.3–1.2)
Total Protein: 7.9 g/dL (ref 6.5–8.1)

## 2020-10-12 LAB — BASIC METABOLIC PANEL
Anion gap: 14 (ref 5–15)
BUN: 8 mg/dL (ref 6–20)
CO2: 19 mmol/L — ABNORMAL LOW (ref 22–32)
Calcium: 9.8 mg/dL (ref 8.9–10.3)
Chloride: 104 mmol/L (ref 98–111)
Creatinine, Ser: 0.65 mg/dL (ref 0.44–1.00)
GFR, Estimated: >60 mL/min (ref >60)
Glucose, Bld: 106 mg/dL — ABNORMAL HIGH (ref 70–99)
Potassium: 3.3 mmol/L — ABNORMAL LOW (ref 3.5–5.1)
Sodium: 137 mmol/L (ref 135–145)

## 2020-10-12 LAB — CBC
HCT: 41.8 % (ref 36.0–46.0)
Hemoglobin: 13.8 g/dL (ref 12.0–15.0)
MCH: 28.9 pg (ref 26.0–34.0)
MCHC: 33 g/dL (ref 30.0–36.0)
MCV: 87.6 fL (ref 80.0–100.0)
Platelets: 336 10*3/uL (ref 150–400)
RBC: 4.77 MIL/uL (ref 3.87–5.11)
RDW: 14.2 % (ref 11.5–15.5)
WBC: 9.6 10*3/uL (ref 4.0–10.5)
nRBC: 0 % (ref 0.0–0.2)

## 2020-10-12 LAB — TROPONIN I (HIGH SENSITIVITY): Troponin I (High Sensitivity): <2 ng/L (ref <18)

## 2020-10-12 LAB — LIPASE, BLOOD: Lipase: 32 U/L (ref 11–51)

## 2020-10-12 IMAGING — CR DG CHEST 2V
2 series · 2 of 2 positions shown · non-contrast
Comparison: [DATE]

CLINICAL DATA: Sudden onset chest pain.

EXAM:
CHEST - 2 VIEW

[w chest pa]
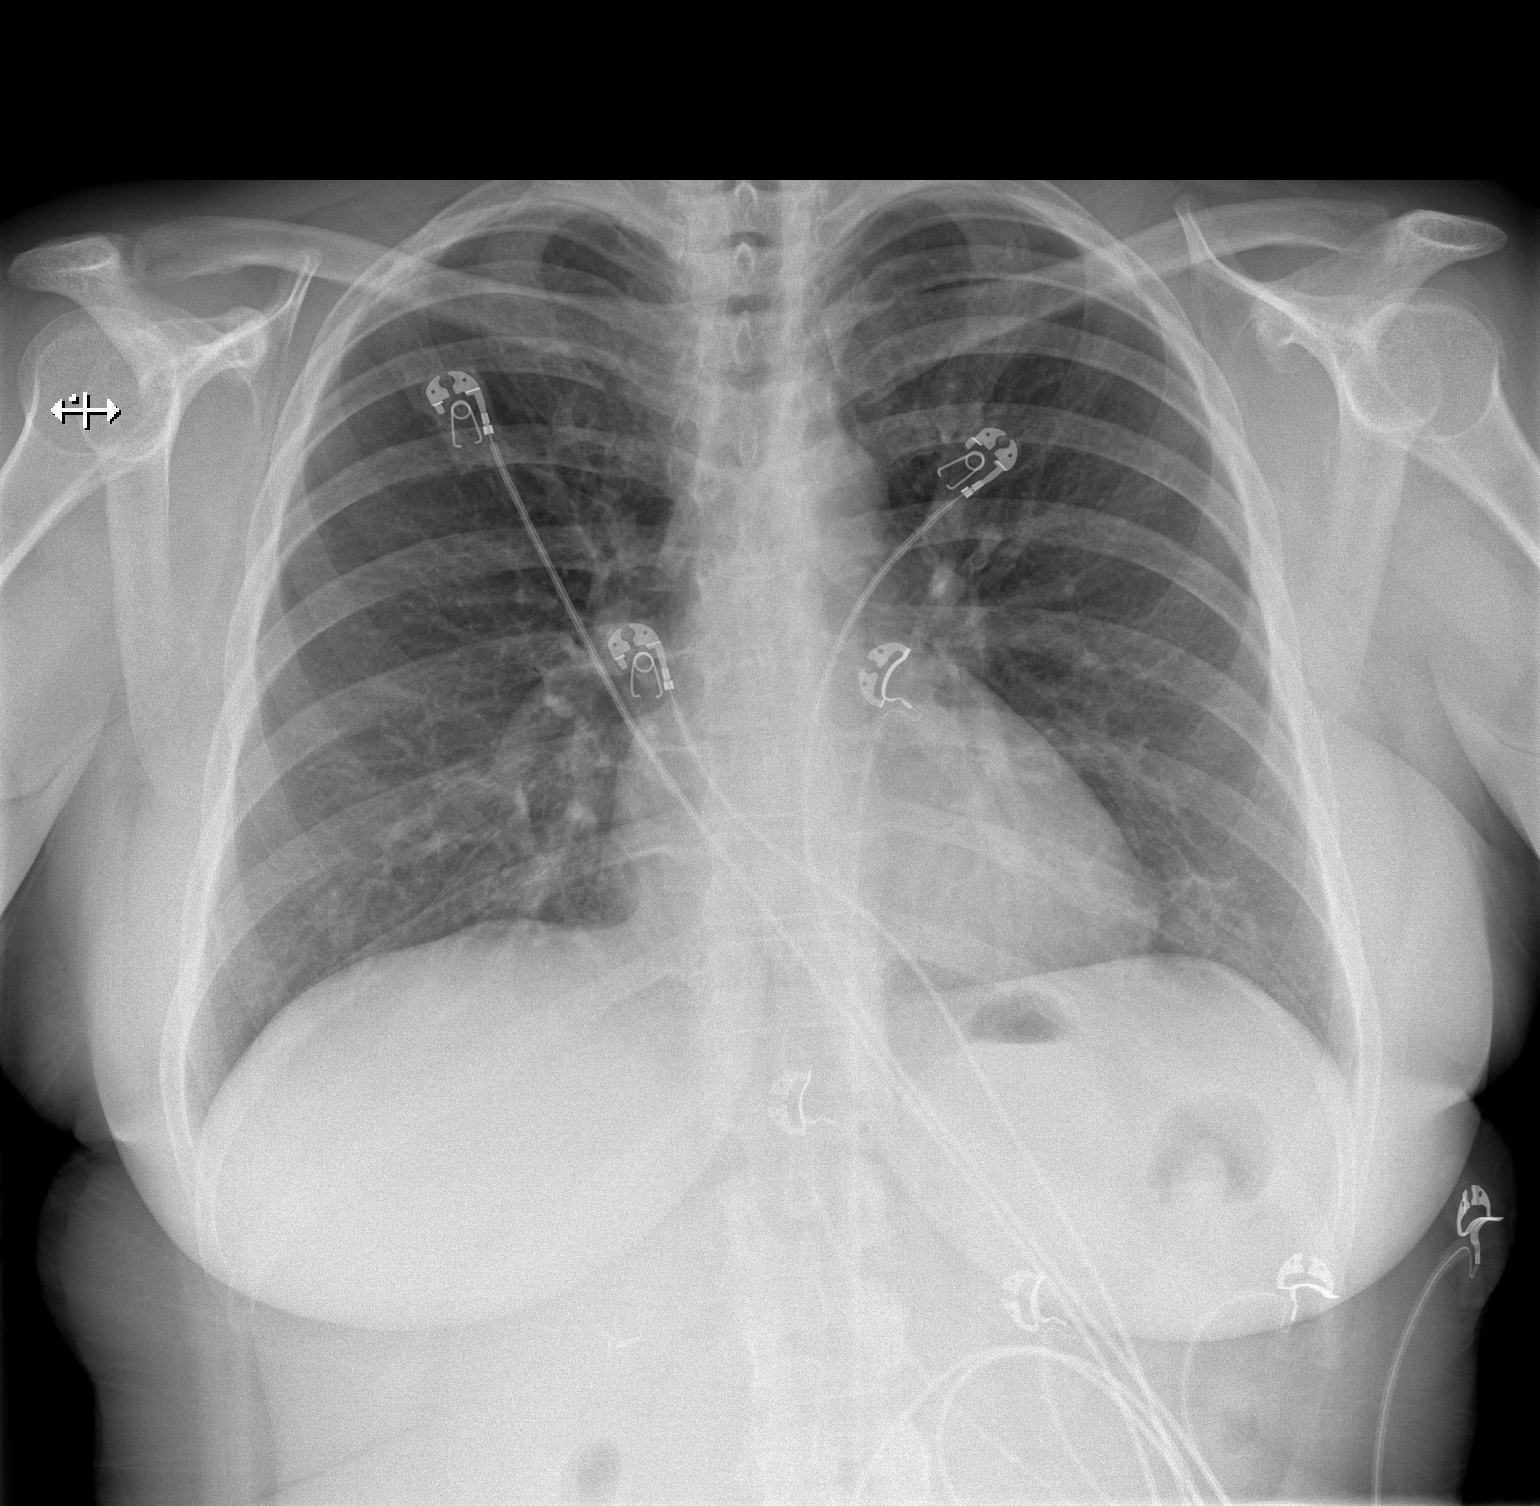

[w chest lat]
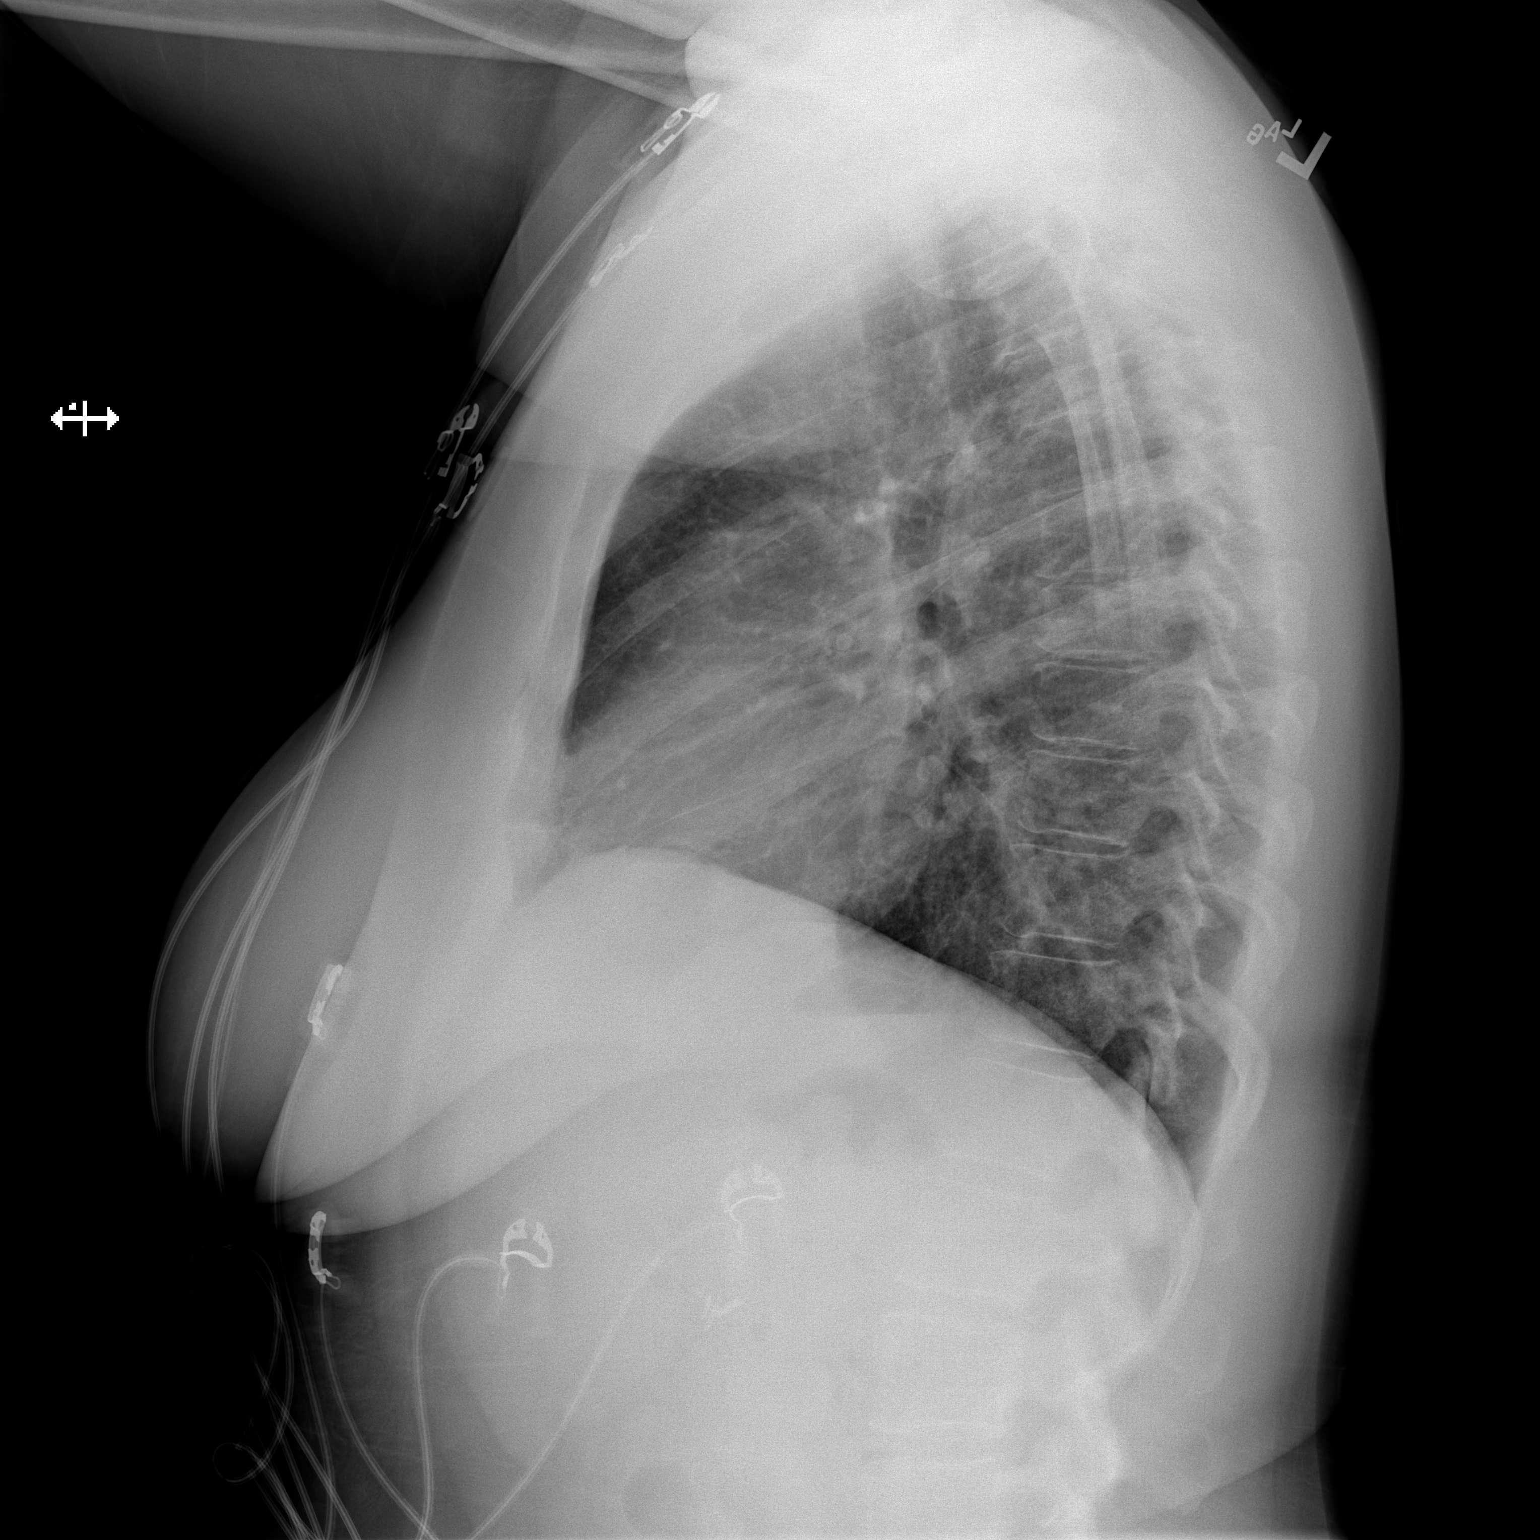

[2 of 2 positions shown; findings below may reference images not displayed]

FINDINGS: The lungs are clear without focal pneumonia, edema, pneumothorax or
pleural effusion. Chronic atelectasis or scarring at the left base
is similar to prior. The cardiopericardial silhouette is within
normal limits for size. The visualized bony structures of the thorax
show no acute abnormality. Telemetry leads overlie the chest.
IMPRESSION: Stable chronic atelectasis or scarring at the left base. No new or
acute cardiopulmonary findings.

## 2020-10-12 MED ORDER — ALUM & MAG HYDROXIDE-SIMETH 200-200-20 MG/5ML PO SUSP
30.0000 mL | Freq: Once | ORAL | Status: AC
  Administered 2020-10-12: 30 mL via ORAL
  Filled 2020-10-12: qty 30

## 2020-10-12 MED ORDER — SODIUM CHLORIDE 0.9 % IV BOLUS
500.0000 mL | Freq: Once | INTRAVENOUS | Status: AC
  Administered 2020-10-12: 500 mL via INTRAVENOUS

## 2020-10-12 MED ORDER — FAMOTIDINE IN NACL 20-0.9 MG/50ML-% IV SOLN
20.0000 mg | Freq: Once | INTRAVENOUS | Status: AC
  Administered 2020-10-12: 20 mg via INTRAVENOUS
  Filled 2020-10-12: qty 50

## 2020-10-12 MED ORDER — FAMOTIDINE 20 MG PO TABS: 20.0000 mg | ORAL_TABLET | Freq: Two times a day (BID) | ORAL | 0 refills | Status: DC

## 2020-10-12 NOTE — ED Notes (Signed)
Pt watching tv, respirations even and unlabored. Denies any needs at this time.

## 2020-10-12 NOTE — ED Provider Notes (Signed)
[facility] DEPT Provider Note   CSN: [id number] Arrival date & time: 10/12/20  0551     History Chief Complaint  Patient presents with  . Chest Pain    Virginia Douglas is a 45 y.o. female with a past medical history of diabetes presenting to the ED with a chief complaint of chest pain.  Yesterday when she got off work had several episodes of nonbloody, nonbilious emesis and diarrhea.  She is unsure what would have caused this.  Shortly after started experiencing left-sided chest pain radiating to her left upper abdomen.  Pain is constant.  She has not tried medications to help with her symptoms.  Denies any shortness of breath or cough that is different from her baseline.  No sick contacts with similar symptoms.  No dysuria or hematuria.  Reports history of similar chest pain several weeks ago which resolved on its own.  Denies history of DVT or PE, recent immobilization.  No leg swelling or OCP use.  HPI     Past Medical History:  Diagnosis Date  . Anemia    iron deficiency  . Diabetes ([place])   . Edema   . Migraine headache     Patient Active Problem List   Diagnosis Date Noted  . Obesity (BMI 30-39.9) 03/15/2020  . Sciatic nerve pain 12/10/2019  . Acute pain of left shoulder 06/05/2019  . Grief 06/05/2019  . Migraine 06/05/2019  . Type 2 diabetes mellitus without complication, without long-term current use of insulin ([place]) 04/07/2019  . Depression 04/07/2019  . Obesity due to excess calories without serious comorbidity 04/07/2019  . Left leg pain 04/07/2019  . Anxiety 04/07/2019  . Headache 07/06/2018  . Diabetes ([place]) 12/08/2015  . Essential hypertension 12/08/2015  . Smoker 12/08/2015  . LLQ pain 12/08/2015  . Paresthesia 09/16/2015  . Urinary urgency 09/16/2015    Past Surgical History:  Procedure Laterality Date  . ABDOMINAL HYSTERECTOMY    . CHOLECYSTECTOMY       OB History   No obstetric history on file.      Family History  Problem Relation Age of Onset  . Diabetes Mother   . Hypertension Mother   . Diabetes Father   . Hypertension Father   . Kidney failure Father   . Hypertension Brother   . Diabetes Maternal Grandmother   . Breast cancer Maternal Grandmother   . Diabetes Paternal Grandmother   . Breast cancer Paternal Grandmother   . Cancer Maternal Aunt        breast  . Breast cancer Maternal Aunt     Social History   Tobacco Use  . Smoking status: Former Smoker    Packs/day: 0.50    Years: 25.00    Pack years: 12.50    Types: Cigarettes    Start date: 03/24/2019  . Smokeless tobacco: Never Used  Vaping Use  . Vaping Use: Never used  Substance Use Topics  . Alcohol use: No    Alcohol/week: 0.0 standard drinks  . Drug use: No    Home Medications Prior to Admission medications   Medication Sig Start Date End Date Taking? Authorizing Provider  famotidine (PEPCID) 20 MG tablet Take 1 tablet (20 mg total) by mouth 2 (two) times daily. 10/12/20  Yes Naydene Kamrowski, PA-C  acetaZOLAMIDE (DIAMOX) 250 MG tablet TAKE 1 TABLET BY MOUTH TWICE A DAY 09/07/20   Jaffe, Adam R, DO  ADDERALL XR 30 MG 24 hr capsule Take 30 mg by mouth  at bedtime. 09/29/20   [provider]  AIMOVIG 140 MG/ML SOAJ INJECT 140 MG INTO THE SKIN EVERY 30 DAYS. 08/02/20   Pieter Partridge, DO  amoxicillin (AMOXIL) 875 MG tablet Take 1 tablet (875 mg total) by mouth 2 (two) times daily. 09/29/20   [name], FNP  amphetamine-dextroamphetamine (ADDERALL) 20 MG tablet Take 1 tablet (20 mg total) by mouth daily. 03/15/20   [name], FNP  atorvastatin (LIPITOR) 10 MG tablet Take 1 tablet (10 mg total) by mouth daily. 06/02/20   [name], FNP  Blood Glucose Monitoring Suppl (ONETOUCH VERIO IQ SYSTEM) w/Device KIT by Does not apply route. Use to check blood sugars twice    [provider]  busPIRone (BUSPAR) 5 MG tablet TAKE 1 TABLET BY MOUTH TWICE A DAY 08/27/19   [name], FNP  clonazePAM  (KLONOPIN) 0.5 MG tablet TAKE 1 TABLET BY MOUTH THREE TIMES A DAY AS NEEDED FOR ANXIETY 03/17/20   [name], FNP  DULoxetine (CYMBALTA) 30 MG capsule TAKE 1 CAPSULE BY MOUTH DAILY FOR ONE WEEK, THEN 2CAPSULES BY MOUTH DAILY. 06/03/20   Tomi Likens, Adam R, DO  eletriptan (RELPAX) 40 MG tablet TAKE 1 TABLET AS NEEDED FOR MIGRAINE/ HEADACHE. MAY REPEAT IN 2 HRS IF HEADACHE PERSISTS OR RECURS. 09/22/20   Metta Clines R, DO  glucose blood test strip 1 each by Other route as needed for other. Insert 1 by subcutaneous route 2 times every day check blood sugar before breakfast and dinner    [provider]  HYDROcodone-acetaminophen (NORCO) 10-325 MG tablet Take 1 tablet by mouth 5 (five) times daily. 09/17/20   [provider]  HYDROcodone-acetaminophen (NORCO/VICODIN) 5-325 MG tablet Take 1 tablet by mouth every 6 (six) hours as needed for moderate pain.    [provider]  hydrOXYzine (VISTARIL) 50 MG capsule TAKE 1 CAPSULE (50 MG TOTAL) BY MOUTH AT BEDTIME AS NEEDED. 08/09/20   [name], FNP  Insulin Pen Needle (PEN NEEDLES) 32G X 4 MM MISC 1 each by Does not apply route once a week. 11/11/18   [name], FNP  Lasmiditan Succinate (REYVOW) 100 MG TABS Take 1 tablet by mouth daily as needed. 12/31/19   Tomi Likens, Adam R, DO  Lifitegrast (XIIDRA) 5 % SOLN Place 1 drop into both eyes daily.    [provider]  lisinopril-hydrochlorothiazide (ZESTORETIC) 10-12.5 MG tablet TAKE 1 TABLET BY MOUTH EVERY DAY 05/26/20   Glendale Chard, MD  SAXENDA 18 MG/3ML SOPN INJECT 0.5 MLS (3 MG TOTAL) INTO THE SKIN DAILY. 09/13/20   [name], FNP  topiramate (TOPAMAX) 50 MG tablet TAKE 2.5 TABLETS (125 MG TOTAL) BY MOUTH AT BEDTIME. 03/29/20   Tomi Likens, Adam R, DO  TURMERIC PO Take 1 capsule by mouth daily.    [provider]  Vitamin D, Ergocalciferol, (DRISDOL) 1.25 MG (50000 UNIT) CAPS capsule TAKE 1 CAPSULE (50,000 UNITS TOTAL) BY MOUTH EVERY 7 (SEVEN) DAYS. 06/16/20   [name],  FNP  metFORMIN (GLUCOPHAGE-XR) 500 MG 24 hr tablet Take 2 tablets (1,000 mg total) by mouth 2 (two) times daily. Patient not taking: Reported on 06/05/2019 03/10/16 08/15/19  [name], MD  Phentermine-Topiramate 11.25-69 MG CP24 Take 1 tablet by mouth daily. 02/04/19 04/07/19  [name], FNP    Allergies    Patient has no known allergies.  Review of Systems   Review of Systems  Constitutional: Negative for appetite change, chills and fever.  HENT: Negative for ear pain, rhinorrhea, sneezing and sore  throat.   Eyes: Negative for photophobia and visual disturbance.  Respiratory: Negative for cough, chest tightness, shortness of breath and wheezing.   Cardiovascular: Positive for chest pain. Negative for palpitations.  Gastrointestinal: Positive for abdominal pain, diarrhea, nausea and vomiting. Negative for blood in stool and constipation.  Genitourinary: Negative for dysuria, hematuria and urgency.  Musculoskeletal: Negative for myalgias.  Skin: Negative for rash.  Neurological: Negative for dizziness, weakness and light-headedness.    Physical Exam Updated Vital Signs BP (!) 113/100   Pulse 78   Temp 98.3 F (36.8 C) (Oral)   Resp 12   Ht 5' 6"  (1.676 m)   Wt 91.6 kg   SpO2 100%   BMI 32.60 kg/m   Physical Exam Vitals and nursing note reviewed.  Constitutional:      General: She is not in acute distress.    Appearance: She is well-developed and well-nourished.     Comments: Speaking in complete sentences without difficulty.  No signs of respiratory distress.  HENT:     Head: Normocephalic and atraumatic.     Nose: Nose normal.  Eyes:     General: No scleral icterus.       Left eye: No discharge.     Extraocular Movements: EOM normal.     Conjunctiva/sclera: Conjunctivae normal.  Cardiovascular:     Rate and Rhythm: Normal rate and regular rhythm.     Pulses: Intact distal pulses.     Heart sounds: Normal heart sounds. No murmur heard. No friction rub. No  gallop.   Pulmonary:     Effort: Pulmonary effort is normal. No respiratory distress.     Breath sounds: Normal breath sounds.  Chest:     Chest wall: Tenderness present.    Abdominal:     General: Bowel sounds are normal. There is no distension.     Palpations: Abdomen is soft.     Tenderness: There is no abdominal tenderness. There is no guarding.  Musculoskeletal:        General: No edema. Normal range of motion.     Cervical back: Normal range of motion and neck supple.     Comments: No lower extremity edema, erythema or calf tenderness bilaterally.  Skin:    General: Skin is warm and dry.     Findings: No rash.  Neurological:     Mental Status: She is alert.     Motor: No abnormal muscle tone.     Coordination: Coordination normal.  Psychiatric:        Mood and Affect: Mood and affect normal.     ED Results / Procedures / Treatments   Labs (all labs ordered are listed, but only abnormal results are displayed) Labs Reviewed  BASIC METABOLIC PANEL - Abnormal; Notable for the following components:      Result Value   Potassium 3.3 (*)    CO2 19 (*)    Glucose, Bld 106 (*)    All other components within normal limits  CBC  HEPATIC FUNCTION PANEL  LIPASE, BLOOD  TROPONIN I (HIGH SENSITIVITY)    EKG None  Radiology DG Chest 2 View  Result Date: 10/12/2020 CLINICAL DATA:  Sudden onset chest pain. EXAM: CHEST - 2 VIEW COMPARISON:  09/26/2013 FINDINGS: The lungs are clear without focal pneumonia, edema, pneumothorax or pleural effusion. Chronic atelectasis or scarring at the left base is similar to prior. The cardiopericardial silhouette is within normal limits for size. The visualized bony structures of the thorax show no acute  abnormality. Telemetry leads overlie the chest. IMPRESSION: Stable chronic atelectasis or scarring at the left base. No new or acute cardiopulmonary findings. Electronically Signed   By: Misty Stanley M.D.   On: 10/12/2020 06:31     Procedures Procedures   Medications Ordered in ED Medications  alum & mag hydroxide-simeth (MAALOX/MYLANTA) 200-200-20 MG/5ML suspension 30 mL (30 mLs Oral Given 10/12/20 0818)  sodium chloride 0.9 % bolus 500 mL (500 mLs Intravenous New Bag/Given 10/12/20 0816)  famotidine (PEPCID) IVPB 20 mg premix (0 mg Intravenous Stopped 10/12/20 0834)    ED Course  I have reviewed the triage vital signs and the nursing notes.  Pertinent labs & imaging results that were available during my care of the patient were reviewed by me and considered in my medical decision making (see chart for details).  Clinical Course as of 10/12/20 0913  Tue Oct 12, 2020  0754 Troponin I (High Sensitivity): <2 [HK]  1025 Hepatic function panel Normal. [HK]    Clinical Course User Index [HK] Delia Heady, PA-C   MDM Rules/Calculators/A&P                          45 year old female with past medical history of diabetes presenting to the ED with a chief complaint of chest pain.  Reports several episodes of nonbloody, nonbilious emesis and diarrhea yesterday when she got off work.  Reports left-sided chest pain rating to left abdomen.  No leg swelling, history of DVT or PE, recent immobilization, history of MI.  On exam patient is overall well-appearing.  Abdomen is soft, nontender nondistended.  She does have some tenderness of the left chest wall.  No signs of respiratory distress noted.  She is speaking in complete sentences without difficulty.  Lungs are clear to auscultation bilaterally.  No lower extremity edema, erythema or calf tenderness bilaterally.  EKG shows normal sinus rhythm, no ischemic changes, no STEMI.  Chest x-ray shows chronic scarring but no acute findings.  CBC, CMP and lipase unremarkable.  Troponin is negative x1.  Patient given IV fluids, Maalox and Pepcid here with some improvement in her symptoms.  She is resting comfortably on my recheck.  States that she is still having some discomfort but  overall appears well.  Explained the results to the patient.  She is relieved of her reassuring work-up and is requesting discharge.  I feel that her symptoms could be musculoskeletal versus GERD versus gastritis.  She is PERC negative.  She is low risk by heart score for ACS based on her work-up and her comorbidities.  No structural cause found on x-ray.  Repeat abdominal exams remain benign so doubt acute surgical intra-abdominal abnormality.  Will treat symptomatically at home.  Return precautions given.  All imaging, if done today, including plain films, CT scans, and ultrasounds, independently reviewed by me, and interpretations confirmed via formal radiology reads.  Patient is hemodynamically stable, in NAD, and able to ambulate in the ED. Evaluation does not show pathology that would require ongoing emergent intervention or inpatient treatment. I explained the diagnosis to the patient. Pain has been managed and has no complaints prior to discharge. Patient is comfortable with above plan and is stable for discharge at this time. All questions were answered prior to disposition. Strict return precautions for returning to the ED were discussed. Encouraged follow up with PCP.   An After Visit Summary was printed and given to the patient.   Portions of this note  were generated with [employer]. Dictation errors may occur despite best attempts at proofreading.  Final Clinical Impression(s) / ED Diagnoses Final diagnoses:  Chest wall pain  Acute gastritis without hemorrhage, unspecified gastritis type    Rx / DC Orders ED Discharge Orders         Ordered    famotidine (PEPCID) 20 MG tablet  2 times daily        10/12/20 0905           Delia Heady, PA-C 10/12/20 0913    Lacretia Leigh, MD 10/13/20 1351

## 2020-10-12 NOTE — ED Triage Notes (Signed)
Patient complaining of left chest pain that woke her up out of her sleep around one am. Patient states she has not taking anything for it.

## 2020-10-12 NOTE — Discharge Instructions (Addendum)
Take the medications as needed to help with your symptoms. Make sure you are drinking plenty of fluids. Follow-up with your primary care provider. Return to the ER if you start to experience worsening abdominal pain, chest pain, inability to tolerate nothing by mouth, leg swelling or trouble breathing.

## 2020-10-13 ENCOUNTER — Other Ambulatory Visit: Payer: Self-pay | Admitting: Neurology

## 2020-10-18 ENCOUNTER — Encounter: Payer: Self-pay | Admitting: Nurse Practitioner

## 2020-11-01 ENCOUNTER — Other Ambulatory Visit: Payer: Self-pay | Admitting: Internal Medicine

## 2020-11-01 ENCOUNTER — Other Ambulatory Visit: Payer: Self-pay | Admitting: Nurse Practitioner

## 2020-11-01 ENCOUNTER — Other Ambulatory Visit: Payer: Self-pay | Admitting: Neurology

## 2020-11-01 DIAGNOSIS — I10 Essential (primary) hypertension: Secondary | ICD-10-CM

## 2020-11-01 DIAGNOSIS — E119 Type 2 diabetes mellitus without complications: Secondary | ICD-10-CM

## 2020-11-01 NOTE — Telephone Encounter (Signed)
Enough given until march 8

## 2020-11-08 NOTE — Progress Notes (Addendum)
NEUROLOGY FOLLOW UP OFFICE NOTE  Virginia Douglas [id number]  Assessment/Plan:   1.  Migraine without aura, without status migrainosus, not intractable 2.  Numbness and tingling - longstanding history.  Precedes starting acetazolamide and topiramate.  Suspect numbness in hands carpal tunnel syndrome (involves first 2 digits, positive Phalen).   3.  History of idiopathic intracranial hypertension - currently no active disease 4.  Left sided lumbar radiculopathy  1.  For migraine:  Aimovig 145m Q4w, topiramate 124mdaily 2.  For IIH:  Acetazolamide 2508mID 3.  For migraine rescue:  Reyvow 4.  Headache cocktail today - with driver 5.  NCV-EMG right upper and lower extremities 6.  Cymbalta 30m17mily for neuropathic pain 7.  Follow up in 6 months.  Subjective:  Virginia Sieg4 y24r old right-handed woman with diabetes and hypertension who follows up for migraines.  UPDATE: Due to ongoing right leg pain, MRI of lumbar spine was performed on 05/09/2020 which was personally reviewed and showed mild lumbar spondylosis and degenerative disc disease with very mild bilateral foraminal stenosis at L5-S1.  She followed up with her PCP regarding right knee pain and swelling.  X-ray on 09/29/2020 was negative.  She continues to have generalized pain and numbness and has appointment with rheumatology.  She reports numbness in the hands.  If she holds something, she may drop it.    Overall, headaches have been manageable Intensity:severe Duration: 30 to 60 minutes with Reyvow Frequency: 2 days a month However, she has a migraine today that has not aborted.  She does report recent stressors affecting her headaches. Current NSAIDS:none Current analgesics:none Other current abortive: none Current triptans:none Current ergotamine:None Current anti-emetic:Zofran 4 mg Current muscle relaxants:Robaxin Current anti-anxiolytic:alprazolam Current sleep  aide:none Current Antihypertensive medications:Lisinopril-HCTZ Current Antidepressant medications:Cymbalta 30mg44mly Current Anticonvulsant medications:Topiramate 125 mg at bedtime, acetazolamide 250mgt1mdaily, Lyrica 100mg t51m times daily. Current anti-CGRP:Aimovig140mg Cu89mt Vitamins/Herbal/Supplements:turmeric Current Antihistamines/Decongestants:none Other therapy:Reyvow (rescue) Hormone/birth control:no  Caffeine:One soda a week Diet:Hydrates Exercise:Yes Depression:Yes; Anxiety:Yes Other pain:No Sleep hygiene:Varies  HISTORY: Onset:Remote history of migraines. Controlled for many years. Returned in May2019.[id number] Kitchenn:Right sided head/ear radiating down right side of neck. Does not radiate down the right arm. Quality:Pounding in head, burning in neck Initial intensity:Severe.Shedenies thunderclap headache or severe headache that wakes herfrom sleep. Aura:no Prodrome:no Postdrome:no Associated symptoms: Nausea, vomiting, photophobia, phonophobia, blurred vision. She denies associated unilateral numbness or weakness. Initial duration:1 day InitialFrequency:2 days a week InitialFrequency of abortive medication:2 days a week Triggers: Emotional stress Relieving factors: Heating pad, Robaxin Activity:Aggravates  She was evaluated in the ED on 02/10/18, where CTA of head and neck was performed and personally reviewed. It demonstrated empty sella but otherwise unremarkable for mass lesion, aneurysm or dissection. She was diagnosed with idiopathic intracranial hypertension diagnosed in 2019.She is on Diamox 250mg dai37mhe is followed by ophthalmology.Exam note from 10/10/18 stated "maybe slight nasal nerve elevation" but no definite signs of papilledema.  She has longstanding history of numbness and tingling in the feet.  NCV-EMG of lower extremities in 2017 was normal.  She reports left sided low back pain  radiating down the side of the left leg with associated numbness in the side of leg and footsince early 2020She had a lumbar X-ray which demonstrated mild dextroscoliosis apex L4. No injury. She reports generalized pain and numbness and tingling.  Labs from 2021 include B12 388,  TSH  0.872, ANA negative, negative dsDNA, sed rate 13, CRP 10, RF negative.  Hgb A1c has increased over 2021 from  5.8 to 6.4.  Gabapentin previously used was ineffective for neuropathic pain.   Past NSAIDS:Ibuprofen, naproxen Past analgesics: tramadol 16m Past abortive triptans:Sumatriptan 100 mg, rizatriptan 179m eletriptan 4057mast muscle relaxants: Flexeril Past anti-emetic:no Past antihypertensive medications:no Past antidepressant medications: nortriptyline 65m22mused nausea/vomiting, problems sleeping) Past anticonvulsant medications: gabapentin 300mg31mce daily(for neuropathic pain) Past vitamins/Herbal/Supplements:no Past antihistamines/decongestants:no Other past therapies:no  Family history of headache:no  MRI of cervical spine from 09/22/15 was personally reviewed and was unremarkable.  PAST MEDICAL HISTORY: Past Medical History:  Diagnosis Date  . Anemia    iron deficiency  . Diabetes ([facility]) [place] Edema   . Migraine headache     MEDICATIONS: Current Outpatient Medications on File Prior to Visit  Medication Sig Dispense Refill  . acetaZOLAMIDE (DIAMOX) 250 MG tablet TAKE 1 TABLET BY MOUTH TWICE A DAY 180 tablet 0  . ADDERALL XR 30 MG 24 hr capsule Take 30 mg by mouth at bedtime.    . AIMMarland KitchenVIG 140 MG/ML SOAJ INJECT 140 MG INTO THE SKIN EVERY 30 DAYS. 1.12 mL 6  . amoxicillin (AMOXIL) 875 MG tablet Take 1 tablet (875 mg total) by mouth 2 (two) times daily. 14 tablet 0  . amphetamine-dextroamphetamine (ADDERALL) 20 MG tablet Take 1 tablet (20 mg total) by mouth daily. 30 tablet 0  . atorvastatin (LIPITOR) 10 MG tablet TAKE 1 TABLET BY MOUTH EVERY DAY 90 tablet 1   . Blood Glucose Monitoring Suppl (ONETOUCH VERIO IQ SYSTEM) w/Device KIT by Does not apply route. Use to check blood sugars twice    . busPIRone (BUSPAR) 5 MG tablet TAKE 1 TABLET BY MOUTH TWICE A DAY 180 tablet 1  . clonazePAM (KLONOPIN) 0.5 MG tablet TAKE 1 TABLET BY MOUTH THREE TIMES A DAY AS NEEDED FOR ANXIETY 30 tablet 1  . DULoxetine (CYMBALTA) 30 MG capsule TAKE 1 CAPSULE BY MOUTH DAILY FOR ONE WEEK, THEN 2CAPSULES BY MOUTH DAILY. 16 capsule 0  . eletriptan (RELPAX) 40 MG tablet TAKE 1 TABLET AS NEEDED FOR MIGRAINE/ HEADACHE. MAY REPEAT IN 2 HRS IF HEADACHE PERSISTS OR RECURS. 10 tablet 1  . famotidine (PEPCID) 20 MG tablet Take 1 tablet (20 mg total) by mouth 2 (two) times daily. 10 tablet 0  . glucose blood test strip 1 each by Other route as needed for other. Insert 1 by subcutaneous route 2 times every day check blood sugar before breakfast and dinner    . HYDROcodone-acetaminophen (NORCO) 10-325 MG tablet Take 1 tablet by mouth 5 (five) times daily.    . HYDMarland KitchenOcodone-acetaminophen (NORCO/VICODIN) 5-325 MG tablet Take 1 tablet by mouth every 6 (six) hours as needed for moderate pain.    . hydrOXYzine (VISTARIL) 50 MG capsule TAKE 1 CAPSULE (50 MG TOTAL) BY MOUTH AT BEDTIME AS NEEDED. 90 capsule 1  . Insulin Pen Needle (PEN NEEDLES) 32G X 4 MM MISC 1 each by Does not apply route once a week. 30 each 3  . Lasmiditan Succinate (REYVOW) 100 MG TABS Take 1 tablet by mouth daily as needed. 16 tablet 5  . Lifitegrast (XIIDRA) 5 % SOLN Place 1 drop into both eyes daily.    . lisMarland Kitchennopril-hydrochlorothiazide (ZESTORETIC) 10-12.5 MG tablet TAKE 1 TABLET BY MOUTH EVERY DAY 90 tablet 1  . SAXENDA 18 MG/3ML SOPN INJECT 0.5 MLS (3 MG TOTAL) INTO THE SKIN DAILY. 15 mL 1  . topiramate (TOPAMAX) 50 MG tablet TAKE 2 AND 1/2 TABLETS (125 MG TOTAL) BY MOUTH AT BEDTIME. 225 tablet 0  . TURMERIC  PO Take 1 capsule by mouth daily.    . Vitamin D, Ergocalciferol, (DRISDOL) 1.25 MG (50000 UNIT) CAPS capsule TAKE 1  CAPSULE (50,000 UNITS TOTAL) BY MOUTH EVERY 7 (SEVEN) DAYS 12 capsule 0  . [DISCONTINUED] metFORMIN (GLUCOPHAGE-XR) 500 MG 24 hr tablet Take 2 tablets (1,000 mg total) by mouth 2 (two) times daily. (Patient not taking: Reported on 06/05/2019) 30 tablet 0  . [DISCONTINUED] Phentermine-Topiramate 11.25-69 MG CP24 Take 1 tablet by mouth daily. 30 capsule 1   No current facility-administered medications on file prior to visit.    ALLERGIES: No Known Allergies  FAMILY HISTORY: Family History  Problem Relation Age of Onset  . Diabetes Mother   . Hypertension Mother   . Diabetes Father   . Hypertension Father   . Kidney failure Father   . Hypertension Brother   . Diabetes Maternal Grandmother   . Breast cancer Maternal Grandmother   . Diabetes Paternal Grandmother   . Breast cancer Paternal Grandmother   . Cancer Maternal Aunt        breast  . Breast cancer Maternal Aunt       Objective:  Blood pressure 126/77, pulse (!) 102, resp. rate 18, height 5' 6" (1.676 m), weight 207 lb (93.9 kg), SpO2 96 %. General: No acute distress.  Patient appears well-groomed.   Head:  Normocephalic/atraumatic Eyes:  Fundi examined but not visualized Neck: supple, no paraspinal tenderness, full range of motion Back: No paraspinal tenderness Neurological Exam: alert and oriented to person, place, and time. Attention span and concentration intact, recent and remote memory intact, fund of knowledge intact.  Speech fluent and not dysarthric, language intact.  CN II-XII intact. Bulk and tone normal, muscle strength 5/5 throughout.  Sensation to pinprick reduced in first 2 digits of both hands, vibratory slightly reduced in toes. Positive Phalen sign bilaterally. Deep tendon reflexes 2+ throughout, toes downgoing.  Finger to nose and heel to shin testing intact.  Gait normal, Romberg negative.   Virginia Clines, DO  CC: [name], FNP

## 2020-11-09 ENCOUNTER — Other Ambulatory Visit: Payer: Self-pay

## 2020-11-09 ENCOUNTER — Encounter: Payer: Self-pay | Admitting: Neurology

## 2020-11-09 ENCOUNTER — Ambulatory Visit (INDEPENDENT_AMBULATORY_CARE_PROVIDER_SITE_OTHER): Payer: No Typology Code available for payment source | Admitting: Neurology

## 2020-11-09 VITALS — BP 126/77 | HR 102 | Resp 18 | Ht 66.0 in | Wt 207.0 lb

## 2020-11-09 DIAGNOSIS — G932 Benign intracranial hypertension: Secondary | ICD-10-CM | POA: Diagnosis not present

## 2020-11-09 DIAGNOSIS — R200 Anesthesia of skin: Secondary | ICD-10-CM | POA: Diagnosis not present

## 2020-11-09 DIAGNOSIS — R202 Paresthesia of skin: Secondary | ICD-10-CM | POA: Diagnosis not present

## 2020-11-09 DIAGNOSIS — G43009 Migraine without aura, not intractable, without status migrainosus: Secondary | ICD-10-CM | POA: Diagnosis not present

## 2020-11-09 MED ORDER — DIPHENHYDRAMINE HCL 50 MG/ML IJ SOLN
25.0000 mg | Freq: Once | INTRAMUSCULAR | Status: AC
  Administered 2020-11-09: 25 mg via INTRAMUSCULAR

## 2020-11-09 MED ORDER — KETOROLAC TROMETHAMINE 60 MG/2ML IM SOLN
60.0000 mg | Freq: Once | INTRAMUSCULAR | Status: AC
  Administered 2020-11-09: 60 mg via INTRAMUSCULAR

## 2020-11-09 MED ORDER — METOCLOPRAMIDE HCL 5 MG/ML IJ SOLN
10.0000 mg | Freq: Once | INTRAMUSCULAR | Status: AC
  Administered 2020-11-09: 10 mg via INTRAMUSCULAR

## 2020-11-09 NOTE — Patient Instructions (Signed)
1.  Continue Aimovig 140mg  every 4 weeks, topiramate 125mg  at bedtime, and acetazolamide 250mg  twice daily 2  Reyvow as needed.  Limit use of pain relievers to no more than 2 days out of week to prevent risk of rebound or medication-overuse headache.  Do not drive for 8 hours after taking 3.  Check nerve conduction study right arm and leg 4.  Follow up 6 months.

## 2020-11-10 ENCOUNTER — Other Ambulatory Visit: Payer: Self-pay | Admitting: Nurse Practitioner

## 2020-11-11 ENCOUNTER — Other Ambulatory Visit: Payer: Self-pay | Admitting: Physician Assistant

## 2020-11-11 ENCOUNTER — Ambulatory Visit
Admission: RE | Admit: 2020-11-11 | Discharge: 2020-11-11 | Disposition: A | Discharge status: Home / Self Care | Payer: No Typology Code available for payment source | Source: Ambulatory Visit | Attending: Physician Assistant | Admitting: Physician Assistant

## 2020-11-11 DIAGNOSIS — M25562 Pain in left knee: Secondary | ICD-10-CM

## 2020-11-11 DIAGNOSIS — M25561 Pain in right knee: Secondary | ICD-10-CM

## 2020-11-11 DIAGNOSIS — R52 Pain, unspecified: Secondary | ICD-10-CM

## 2020-11-11 IMAGING — CR DG KNEE 1-2V*L*
2 series · 2 of 2 positions shown · non-contrast
Comparison: None.

CLINICAL DATA: Pain.

EXAM:
LEFT KNEE - 1-2 VIEW

[t knee ap left]
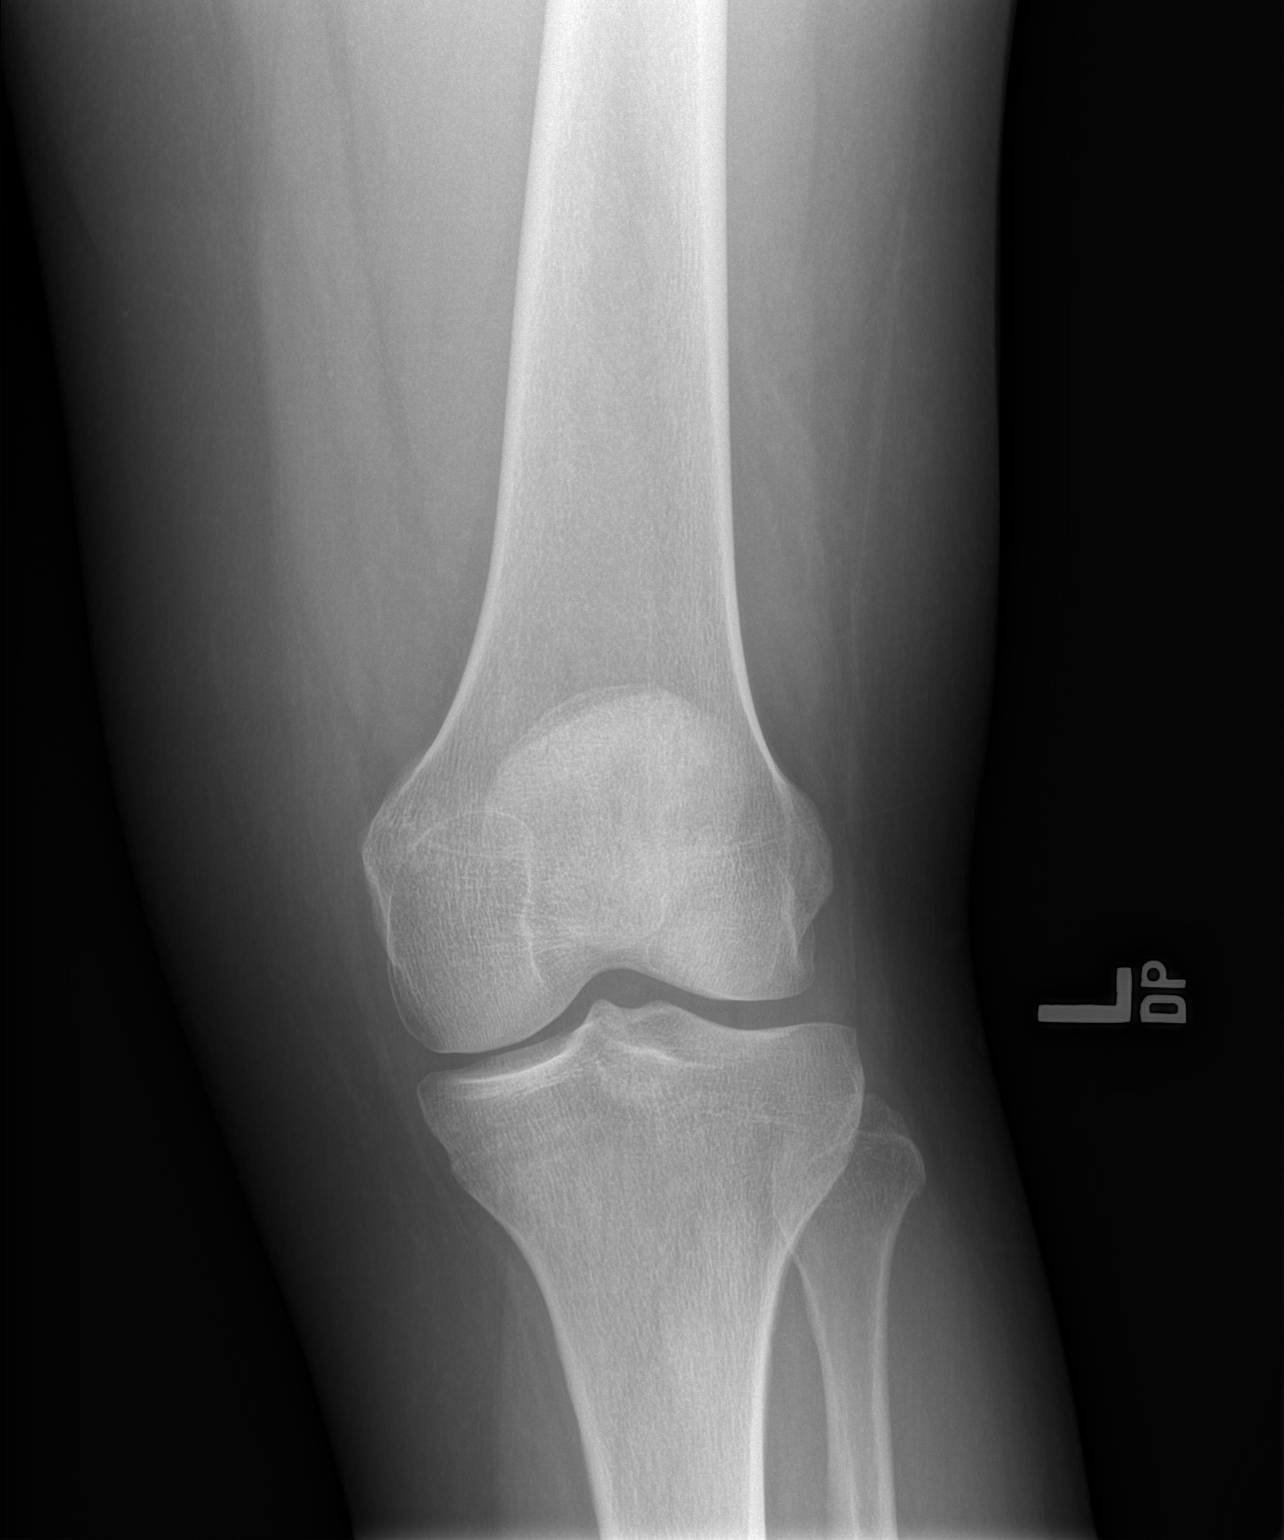

[t knee lat left]
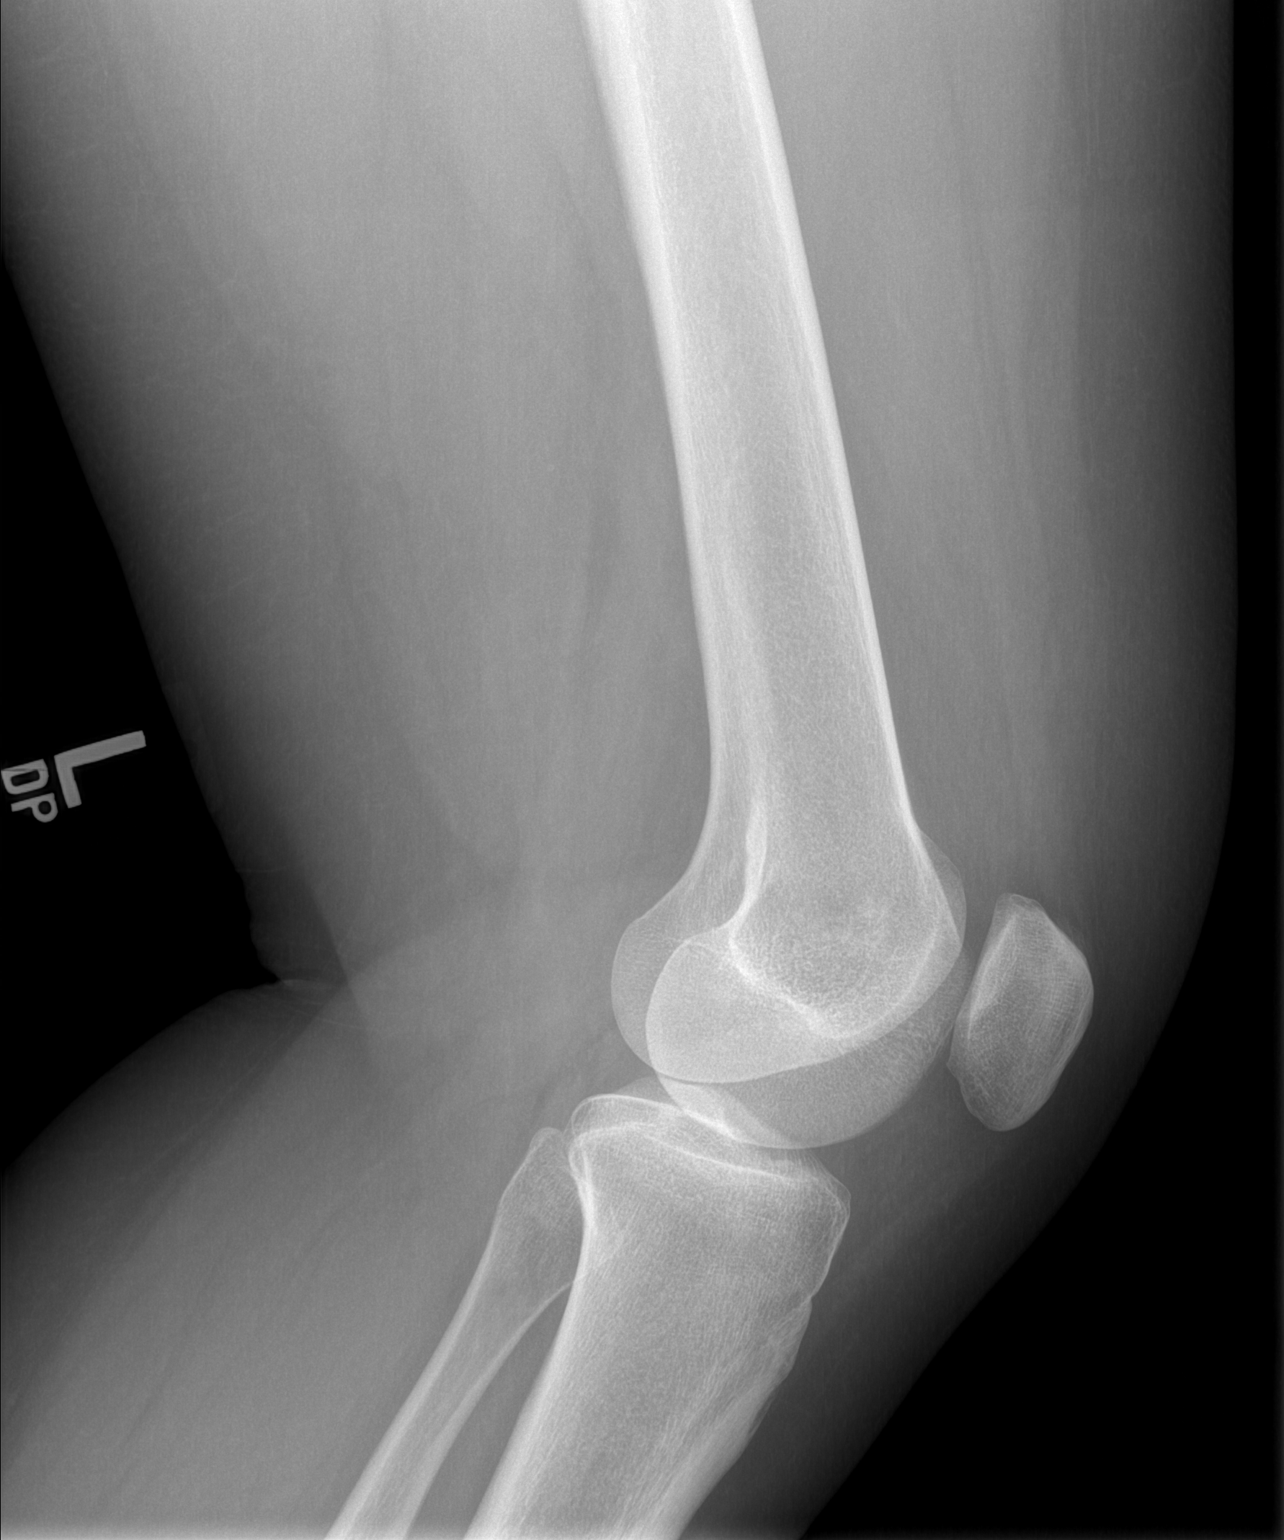

[2 of 2 positions shown; findings below may reference images not displayed]

FINDINGS: No evidence of fracture, dislocation, or joint effusion. No evidence
of arthropathy or other focal bone abnormality. Soft tissues are
unremarkable.
IMPRESSION: Negative.

## 2020-11-11 IMAGING — CR DG CERVICAL SPINE 2 OR 3 VIEWS
3 series · 3 of 3 positions shown · non-contrast
Comparison: None

CLINICAL DATA: Cervical pain.  No known injury.

EXAM:
CERVICAL SPINE - 2-3 VIEW

[w c-spine lat]
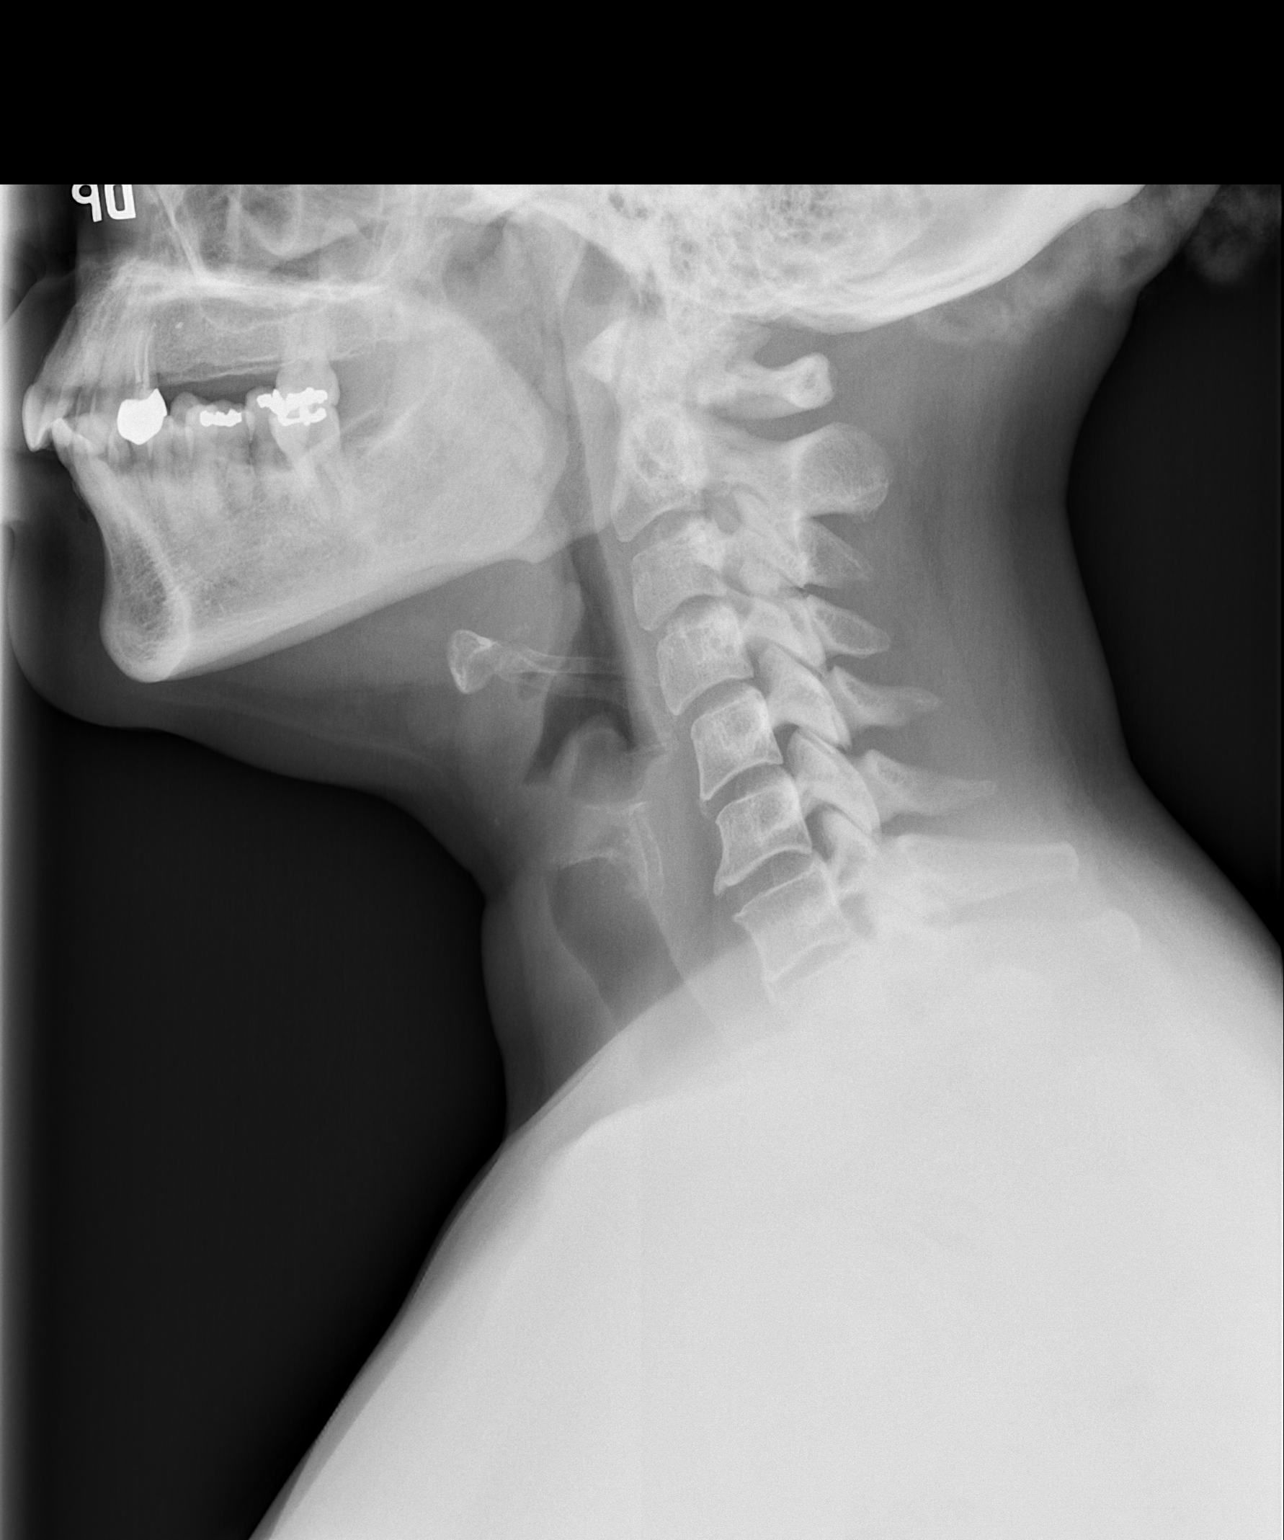

[w c-spine a.p. *]
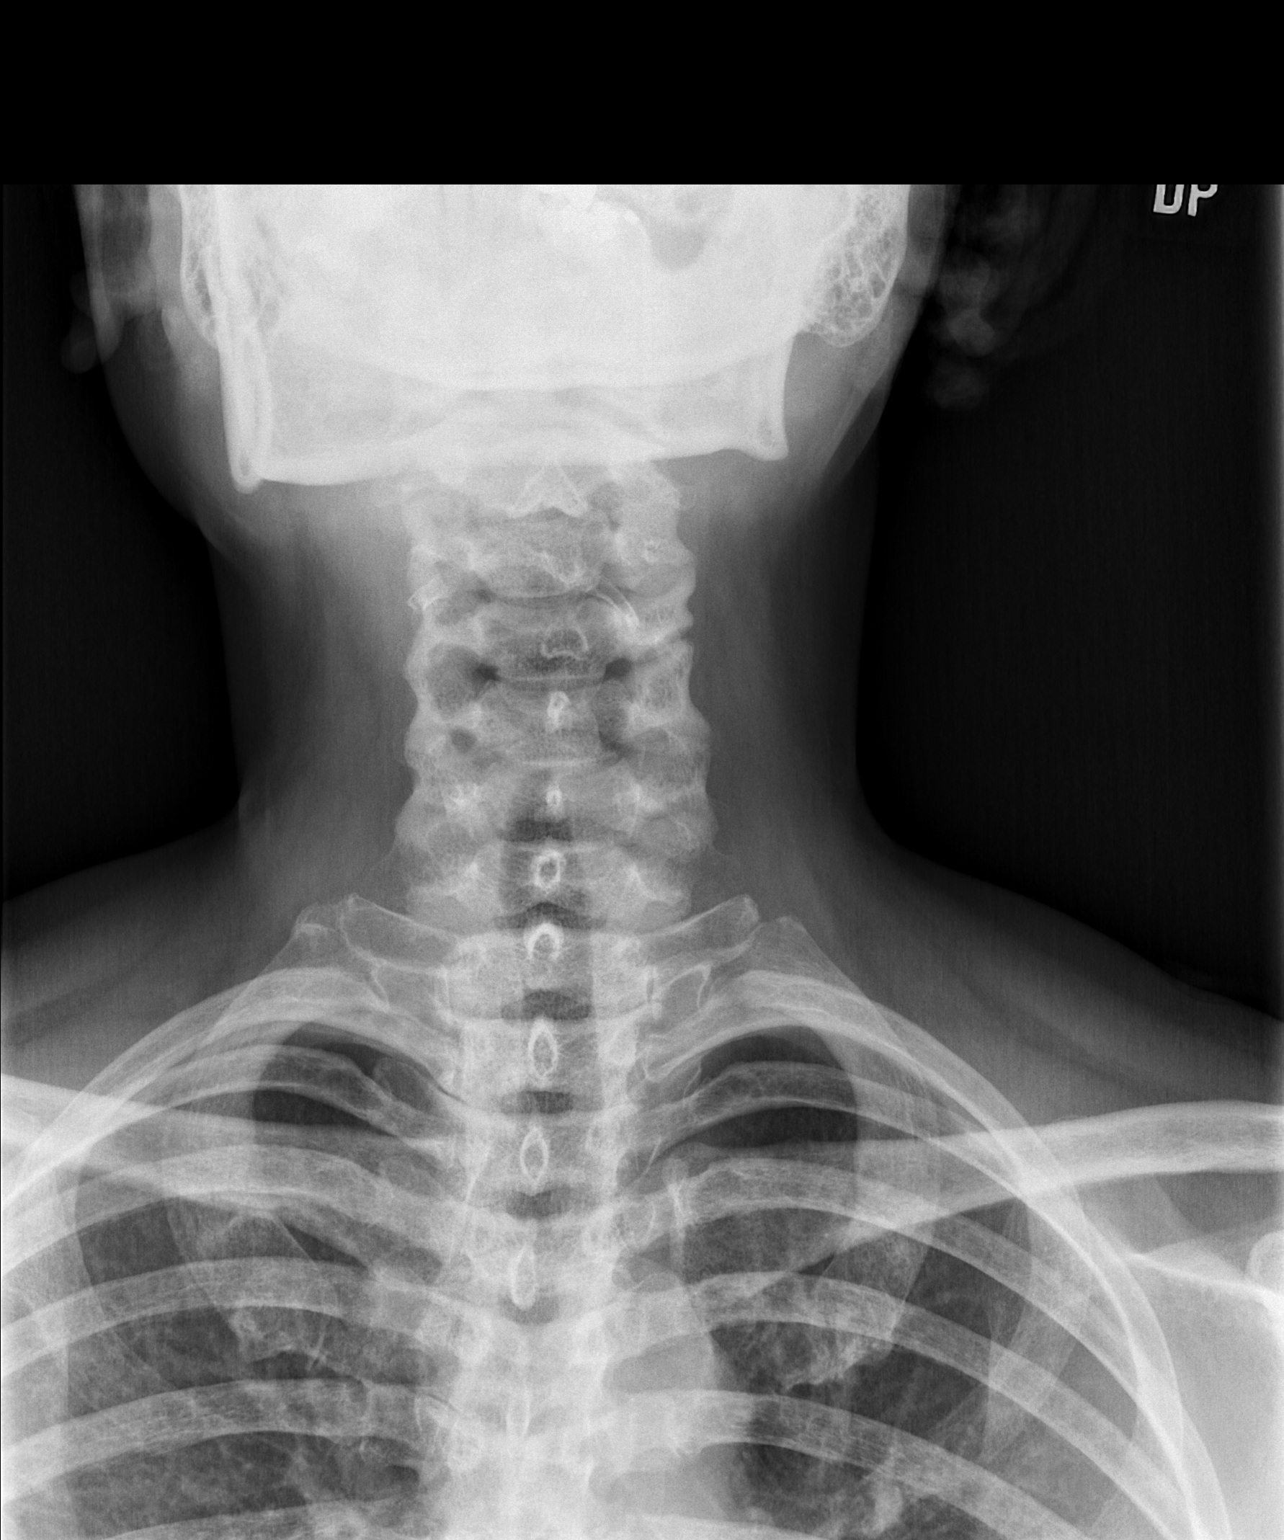

[w c-spine odontoid *]
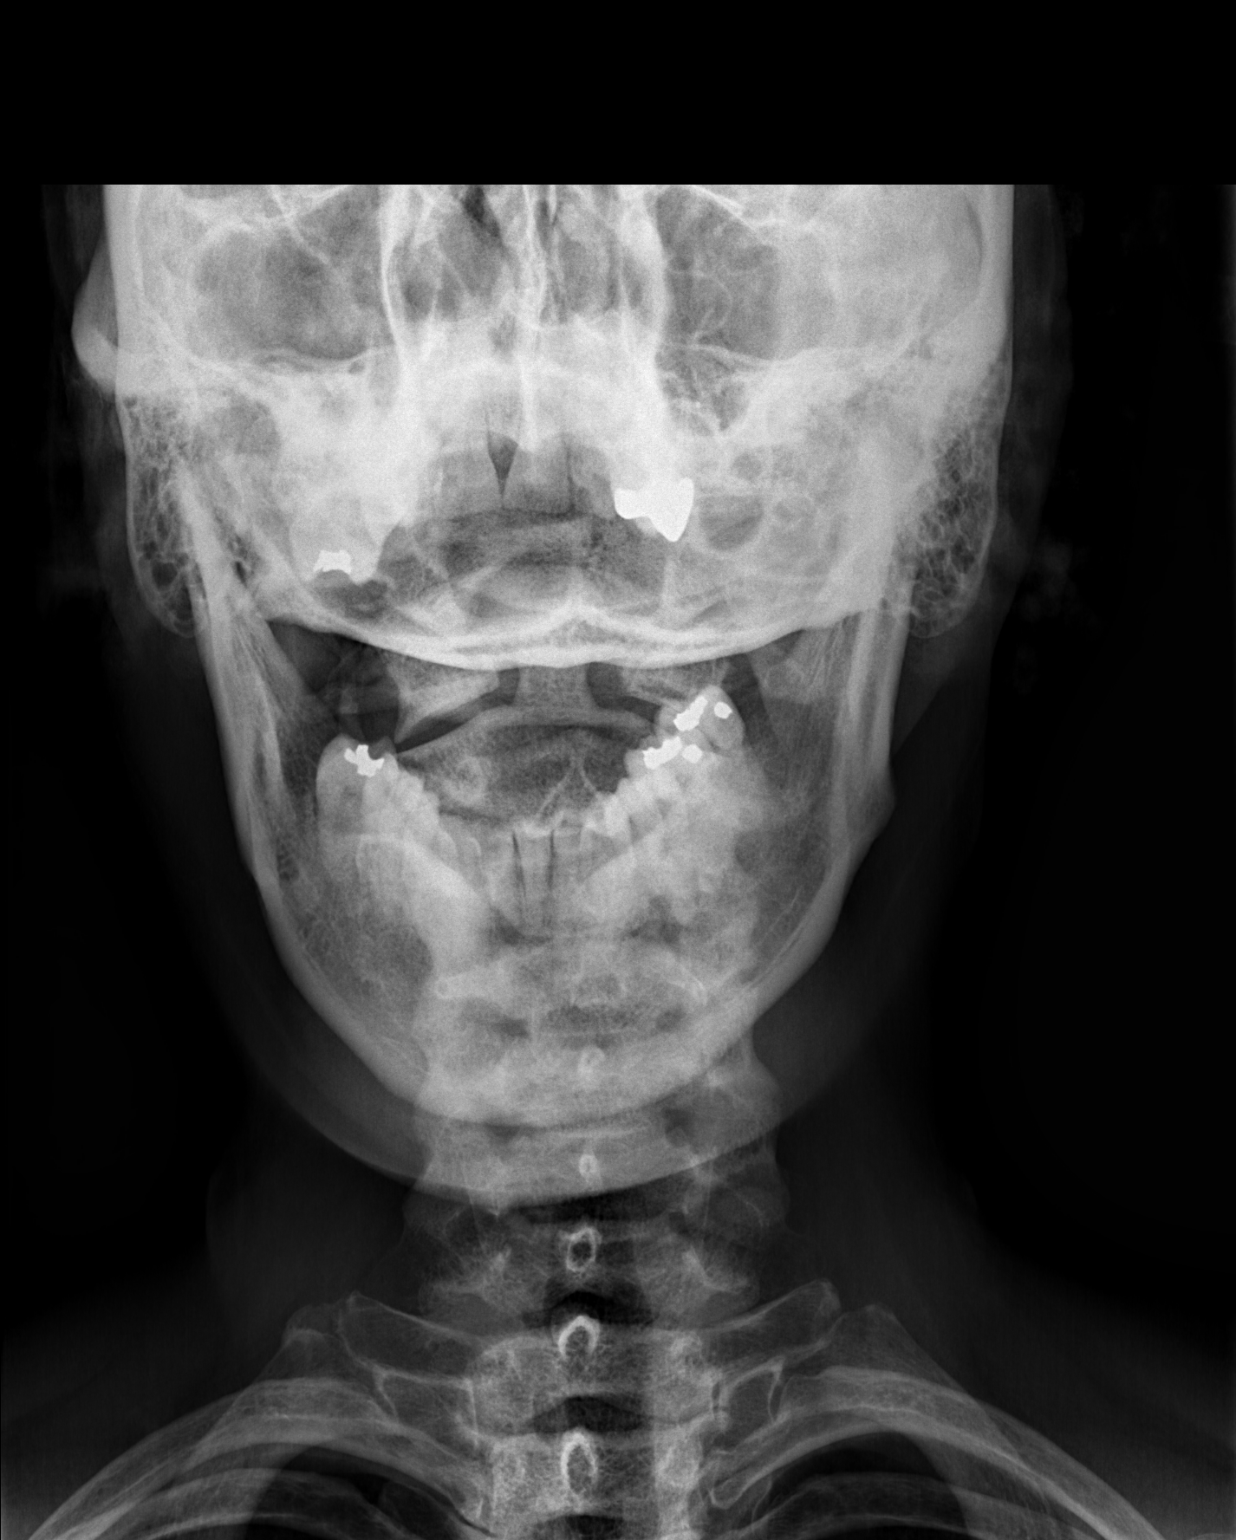

[3 of 3 positions shown; findings below may reference images not displayed]

FINDINGS: Straightening of normal lordosis. No other malalignment. No
fractures. Mild degenerative disc disease with small anterior
osteophytes at C5-6, C6-7, and C7-T1.

The lung apices are normal.

No other abnormalities.
IMPRESSION: Mild degenerative changes.  No other abnormalities.

## 2020-11-11 IMAGING — CR DG KNEE 1-2V*R*
2 series · 2 of 2 positions shown · non-contrast
Comparison: None.

CLINICAL DATA: Bilateral knee pain.

EXAM:
RIGHT KNEE - 1-2 VIEW

[t knee ap right]
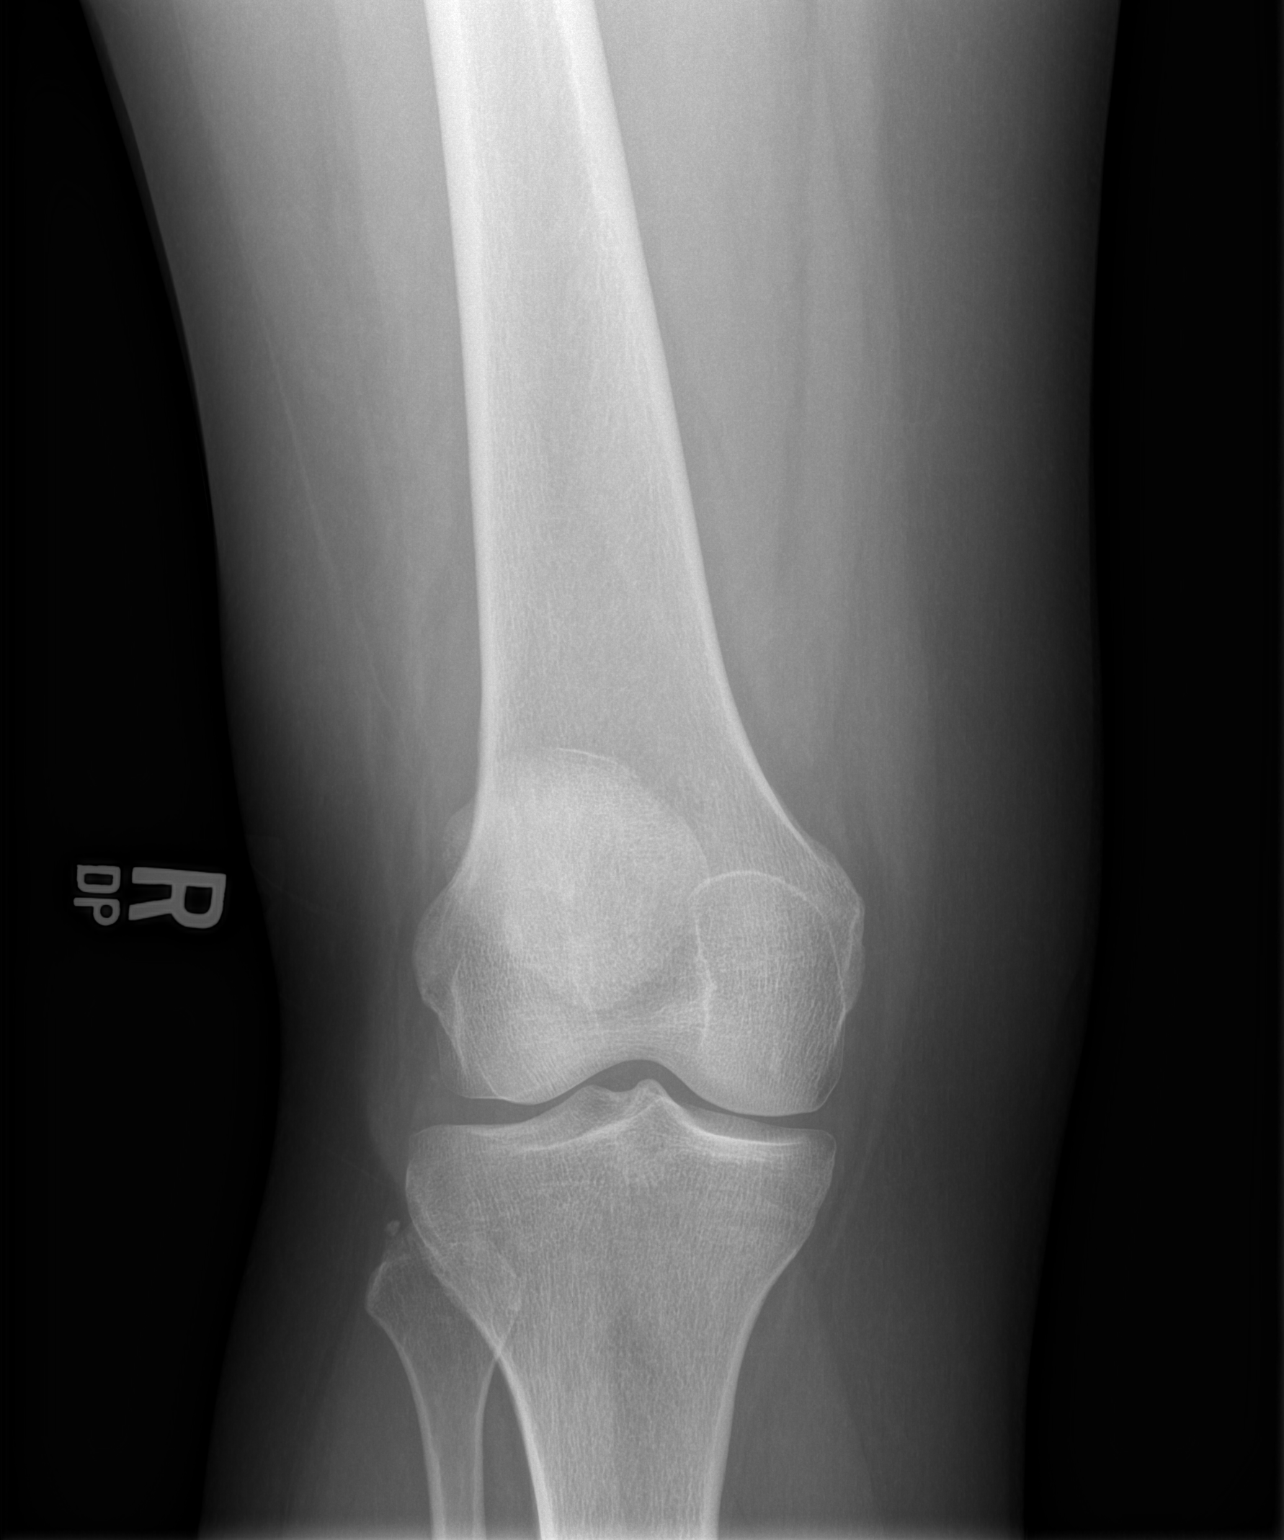

[t knee lat right]
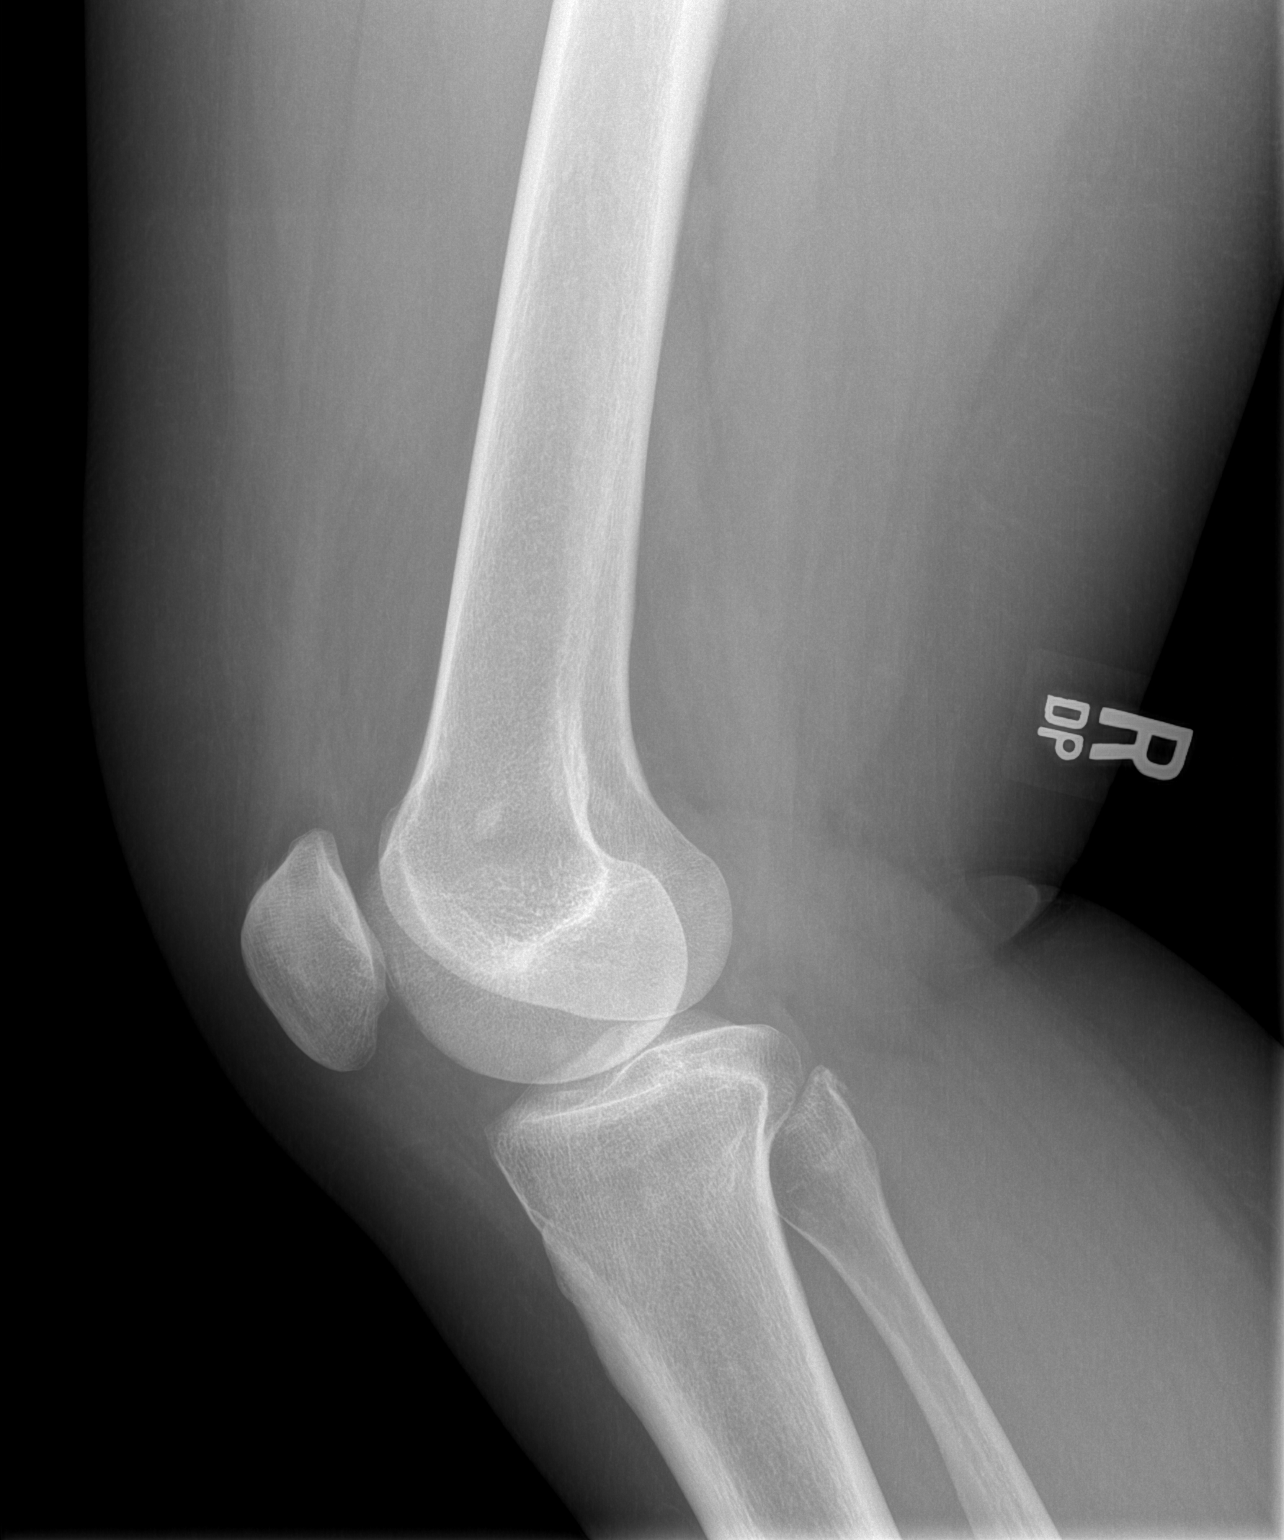

[2 of 2 positions shown; findings below may reference images not displayed]

FINDINGS: No evidence of fracture, dislocation, or joint effusion. No evidence
of arthropathy or other focal bone abnormality. Soft tissues are
unremarkable.
IMPRESSION: Negative.

## 2020-11-16 ENCOUNTER — Encounter: Payer: Self-pay | Admitting: Nurse Practitioner

## 2020-11-16 ENCOUNTER — Other Ambulatory Visit: Payer: Self-pay

## 2020-11-16 ENCOUNTER — Ambulatory Visit (INDEPENDENT_AMBULATORY_CARE_PROVIDER_SITE_OTHER): Payer: No Typology Code available for payment source | Admitting: Nurse Practitioner

## 2020-11-16 VITALS — BP 124/80 | HR 101 | Temp 98.2°F | Ht 66.0 in | Wt 206.4 lb

## 2020-11-16 DIAGNOSIS — L918 Other hypertrophic disorders of the skin: Secondary | ICD-10-CM | POA: Diagnosis not present

## 2020-11-16 DIAGNOSIS — Z6833 Body mass index (BMI) 33.0-33.9, adult: Secondary | ICD-10-CM

## 2020-11-16 DIAGNOSIS — E669 Obesity, unspecified: Secondary | ICD-10-CM | POA: Diagnosis not present

## 2020-11-16 DIAGNOSIS — F32A Depression, unspecified: Secondary | ICD-10-CM | POA: Diagnosis not present

## 2020-11-16 DIAGNOSIS — R6882 Decreased libido: Secondary | ICD-10-CM

## 2020-11-16 MED ORDER — BUPROPION HCL ER (XL) 150 MG PO TB24: 150.0000 mg | ORAL_TABLET | ORAL | 2 refills | Status: DC

## 2020-11-16 NOTE — Progress Notes (Signed)
I,Virginia Douglas as a Education administrator for [employer], FNP.,have documented all relevant documentation on the behalf of Virginia Brine, FNP,as directed by  Virginia Brine, FNP while in the presence of Virginia Douglas, [place]. This visit occurred during the SARS-CoV-2 public health emergency.  Safety protocols were in place, including screening questions prior to the visit, additional usage of staff PPE, and extensive cleaning of exam room while observing appropriate contact time as indicated for disinfecting solutions.  Subjective:     Patient ID: Virginia Douglas , female    DOB: September 11, 1975 , 45 y.o.   MRN: [id number]   Chief Complaint  Patient presents with  . decrease libido  . skin tag removal    HPI  Patient presents today to discuss her decreased libido. She would also like for you to remove a skin tag on her arm. She was prescribed linzess due to constipation from pain medications.   saxenda 3 mg daily.  She is not eating a lot at one time, continues to drink sodas. She is not exercising until yesterday.    Wt Readings from Last 3 Encounters: 11/16/20 : 206 lb 6.4 oz (93.6 kg) 11/09/20 : 207 lb (93.9 kg) 10/12/20 : 202 lb (91.6 kg)    Past Medical History:  Diagnosis Date  . Anemia    iron deficiency  . Diabetes ([place])   . Edema   . Migraine headache      Family History  Problem Relation Age of Onset  . Diabetes Mother   . Hypertension Mother   . Diabetes Father   . Hypertension Father   . Kidney failure Father   . Hypertension Brother   . Diabetes Maternal Grandmother   . Breast cancer Maternal Grandmother   . Diabetes Paternal Grandmother   . Breast cancer Paternal Grandmother   . Cancer Maternal Aunt        breast  . Breast cancer Maternal Aunt      Current Outpatient Medications:  .  acetaZOLAMIDE (DIAMOX) 250 MG tablet, TAKE 1 TABLET BY MOUTH TWICE A DAY, Disp: 180 tablet, Rfl: 0 .  ADDERALL XR 30 MG 24 hr capsule, Take 30 mg by mouth at bedtime.,  Disp: , Rfl:  .  AIMOVIG 140 MG/ML SOAJ, INJECT 140 MG INTO THE SKIN EVERY 30 DAYS., Disp: 1.12 mL, Rfl: 6 .  amphetamine-dextroamphetamine (ADDERALL) 20 MG tablet, Take 1 tablet (20 mg total) by mouth daily., Disp: 30 tablet, Rfl: 0 .  atorvastatin (LIPITOR) 10 MG tablet, TAKE 1 TABLET BY MOUTH EVERY DAY, Disp: 90 tablet, Rfl: 1 .  Blood Glucose Monitoring Suppl (ONETOUCH VERIO IQ SYSTEM) w/Device KIT, by Does not apply route. Use to check blood sugars twice, Disp: , Rfl:  .  buPROPion (WELLBUTRIN XL) 150 MG 24 hr tablet, Take 1 tablet (150 mg total) by mouth every morning., Disp: 30 tablet, Rfl: 2 .  busPIRone (BUSPAR) 5 MG tablet, TAKE 1 TABLET BY MOUTH TWICE A DAY, Disp: 180 tablet, Rfl: 1 .  clonazePAM (KLONOPIN) 0.5 MG tablet, TAKE 1 TABLET BY MOUTH THREE TIMES A DAY AS NEEDED FOR ANXIETY, Disp: 30 tablet, Rfl: 1 .  DULoxetine (CYMBALTA) 30 MG capsule, TAKE 1 CAPSULE BY MOUTH DAILY FOR ONE WEEK, THEN 2CAPSULES BY MOUTH DAILY., Disp: 16 capsule, Rfl: 0 .  eletriptan (RELPAX) 40 MG tablet, TAKE 1 TABLET AS NEEDED FOR MIGRAINE/ HEADACHE. MAY REPEAT IN 2 HRS IF HEADACHE PERSISTS OR RECURS., Disp: 10 tablet, Rfl: 1 .  famotidine (PEPCID) 20 MG  tablet, Take 1 tablet (20 mg total) by mouth 2 (two) times daily., Disp: 10 tablet, Rfl: 0 .  glucose blood test strip, 1 each by Other route as needed for other. Insert 1 by subcutaneous route 2 times every day check blood sugar before breakfast and dinner, Disp: , Rfl:  .  hydrOXYzine (VISTARIL) 50 MG capsule, TAKE 1 CAPSULE (50 MG TOTAL) BY MOUTH AT BEDTIME AS NEEDED., Disp: 90 capsule, Rfl: 1 .  Insulin Pen Needle (PEN NEEDLES) 32G X 4 MM MISC, 1 each by Does not apply route once a week., Disp: 30 each, Rfl: 3 .  Lasmiditan Succinate (REYVOW) 100 MG TABS, Take 1 tablet by mouth daily as needed., Disp: 16 tablet, Rfl: 5 .  Lifitegrast (XIIDRA) 5 % SOLN, Place 1 drop into both eyes daily., Disp: , Rfl:  .  lisinopril-hydrochlorothiazide (ZESTORETIC) 10-12.5  MG tablet, TAKE 1 TABLET BY MOUTH EVERY DAY, Disp: 90 tablet, Rfl: 1 .  topiramate (TOPAMAX) 50 MG tablet, TAKE 2 AND 1/2 TABLETS (125 MG TOTAL) BY MOUTH AT BEDTIME., Disp: 225 tablet, Rfl: 0 .  TURMERIC PO, Take 1 capsule by mouth daily., Disp: , Rfl:  .  Vitamin D, Ergocalciferol, (DRISDOL) 1.25 MG (50000 UNIT) CAPS capsule, TAKE 1 CAPSULE (50,000 UNITS TOTAL) BY MOUTH EVERY 7 (SEVEN) DAYS, Disp: 12 capsule, Rfl: 0 .  HYDROcodone-acetaminophen (NORCO) 10-325 MG tablet, Take 1 tablet by mouth 5 (five) times daily. (Patient not taking: No sig reported), Disp: , Rfl:  .  HYDROcodone-acetaminophen (NORCO/VICODIN) 5-325 MG tablet, Take 1 tablet by mouth every 6 (six) hours as needed for moderate pain. (Patient not taking: No sig reported), Disp: , Rfl:    No Known Allergies   Review of Systems  Constitutional: Negative.  Negative for fatigue.  Respiratory: Negative.  Negative for cough and wheezing.   Cardiovascular: Negative for chest pain, palpitations and leg swelling.  Psychiatric/Behavioral: Negative.      Today's Vitals   11/16/20 1643  BP: 124/80  Pulse: (!) 101  Temp: 98.2 F (36.8 C)  TempSrc: Oral  Weight: 206 lb 6.4 oz (93.6 kg)  Height: 5' 6"  (1.676 m)  PainSc: 0-No pain   Body mass index is 33.31 kg/m.   Objective:  Physical Exam Constitutional:      General: She is not in acute distress.    Appearance: Normal appearance.  Cardiovascular:     Rate and Rhythm: Normal rate and regular rhythm.     Pulses: Normal pulses.     Heart sounds: Normal heart sounds. No murmur heard.   Pulmonary:     Effort: Pulmonary effort is normal. No respiratory distress.     Breath sounds: Normal breath sounds. No wheezing.  Skin:    Capillary Refill: Capillary refill takes less than 2 seconds.     Comments: Skin tag to left deltoid area, negative bleeding or discoloration  Neurological:     General: No focal deficit present.     Mental Status: She is alert and oriented to person,  place, and time.     Cranial Nerves: No cranial nerve deficit.  Psychiatric:        Mood and Affect: Mood normal.        Behavior: Behavior normal.        Thought Content: Thought content normal.        Judgment: Judgment normal.         Assessment And Plan:     1. Decreased libido  This could be related  to her mood as her depression score is 27 up from 15, I will refer to psychology for counseling since her job is not covering any longer  I would not expect this to be related to menopause due to age  Depression screen [facility] 2/9 11/16/2020 07/15/2020 12/18/2019 10/16/2019 07/15/2019  Decreased Interest 3 3 1 3 3   Down, Depressed, Hopeless 3 2 1 1 3   PHQ - 2 Score 6 5 2 4 6   Altered sleeping 3 3 3 3 3   Tired, decreased energy 3 3 3 3 3   Change in appetite 3 2 3 3 3   Feeling bad or failure about yourself  3 0 1 0 1  Trouble concentrating 3 0 3 3 3   Moving slowly or fidgety/restless 3 2 0 3 0  Suicidal thoughts 3 0 0 0 0  PHQ-9 Score 27 15 15 19 19   Difficult doing work/chores Very difficult Somewhat difficult Very difficult Very difficult Very difficult  Some recent data might be hidden    2. Depression, unspecified depression type  Depression score was 27 will start her on wellbutrin and refer to psychiatry  Discussed side effects of wellbutrin and if has any different thoughts to call to office - Ambulatory referral to Psychiatry - buPROPion (WELLBUTRIN XL) 150 MG 24 hr tablet; Take 1 tablet (150 mg total) by mouth every morning.  Dispense: 30 tablet; Refill: 2  3. Skin tag Removed skin tag from left deltoid area after cleansing with betadine and using scalpel, applied nitrate stick to keep from bleeding. Tolerated well.   4. Obesity (BMI 30-39.9)  Chronic  Discussed healthy diet and regular exercise options   Encouraged to exercise at least 150 minutes per week with 2 days of strength training     Patient was given opportunity to ask questions. Patient verbalized  understanding of the plan and was able to repeat key elements of the plan. All questions were answered to their satisfaction.  Virginia Brine, FNP    I, Virginia Brine, FNP, have reviewed all documentation for this visit. The documentation on 11/16/20 for the exam, diagnosis, procedures, and orders are all accurate and complete.  IF YOU HAVE BEEN REFERRED TO A SPECIALIST, IT MAY TAKE 1-2 WEEKS TO SCHEDULE/PROCESS THE REFERRAL. IF YOU HAVE NOT HEARD FROM US/SPECIALIST IN TWO WEEKS, PLEASE GIVE [place] A CALL AT [phone].   THE PATIENT IS ENCOURAGED TO PRACTICE SOCIAL DISTANCING DUE TO THE COVID-19 PANDEMIC.

## 2020-11-17 ENCOUNTER — Encounter: Payer: Self-pay | Admitting: Nurse Practitioner

## 2020-11-21 ENCOUNTER — Encounter: Payer: Self-pay | Admitting: Nurse Practitioner

## 2020-11-22 NOTE — Progress Notes (Signed)
Office Visit Note  Patient: Virginia Douglas             Date of Birth: 10-13-1975           MRN: [id number]             PCP: [name], FNP Referring: [name], FNP Visit Date: 12/06/2020 Occupation: @GUAROCC @  Subjective:  Pain in all the joints and muscles.   History of Present Illness: Virginia Douglas is a 45 y.o. female seen in the consultation per request of her PCP.  According to patient 4 years ago she fell and landed on her knees and her chest.  A week later she started having pain in her knee joints and lower back.  She was taking anti-inflammatories over-the-counter.  She is been also experiencing tingling in her hands and her feet.  She states she has noticed intermittent swelling in her feet.  She has been seeing Dr. Tomi Likens for migraine headaches.  She mentions lower back pain to have and had MRI of her lumbar spine in the past which showed degenerative changes.  She recently started having a lot of neck pain and stiffness.  She states the x-rays done showed some degenerative changes but she is still have difficulty rotating her head.  She also has lot of discomfort in the trapezius region.  She states that she has been under a lot of stress over the last few years.  She lost her grandparents and her father few years back.  And her mother was murdered couple of years ago.  She is concerned that she may have fibromyalgia.  She has generalized pain and hyperalgesia.  Activities of Daily Living:  Patient reports morning stiffness for 30 minutes.   Patient Reports nocturnal pain.  Difficulty dressing/grooming: Reports Difficulty climbing stairs: Reports Difficulty getting out of chair: Reports Difficulty using hands for taps, buttons, cutlery, and/or writing: Reports  Review of Systems  Constitutional: Positive for fatigue. Negative for night sweats, weight gain and weight loss.  HENT: Positive for mouth dryness and nose dryness. Negative for mouth sores,  trouble swallowing, trouble swallowing and sore tongue.   Eyes: Positive for dryness. Negative for pain, redness, itching and visual disturbance.  Respiratory: Negative for cough, shortness of breath and difficulty breathing.   Cardiovascular: Negative for chest pain, palpitations, hypertension, irregular heartbeat and swelling in legs/feet.  Gastrointestinal: Negative for blood in stool, constipation and diarrhea.  Endocrine: Negative for increased urination.  Genitourinary: Negative for difficulty urinating and vaginal dryness.  Musculoskeletal: Positive for arthralgias, joint pain, myalgias, morning stiffness, muscle tenderness and myalgias. Negative for joint swelling and muscle weakness.  Skin: Negative for color change, rash, hair loss, redness, skin tightness, ulcers and sensitivity to sunlight.  Allergic/Immunologic: Positive for susceptible to infections.  Neurological: Positive for dizziness, headaches, parasthesias and memory loss. Negative for night sweats and weakness.  Hematological: Negative for bruising/bleeding tendency and swollen glands.  Psychiatric/Behavioral: Positive for depressed mood and sleep disturbance. Negative for confusion. The patient is nervous/anxious.     PMFS History:  Patient Active Problem List   Diagnosis Date Noted  . DDD (degenerative disc disease), cervical 12/06/2020  . DDD (degenerative disc disease), lumbar 12/06/2020  . Gastroesophageal reflux disease with esophagitis without hemorrhage 12/06/2020  . Increased intracranial pressure 12/06/2020  . PTSD (post-traumatic stress disorder) 12/06/2020  . Attention deficit hyperactivity disorder (ADHD), predominantly inattentive type 12/06/2020  . Vitamin D deficiency 12/06/2020  . Obesity (BMI 30-39.9) 03/15/2020  . Sciatic nerve  pain 12/10/2019  . Acute pain of left shoulder 06/05/2019  . Grief 06/05/2019  . Migraine 06/05/2019  . Type 2 diabetes mellitus without complication, without long-term  current use of insulin ([place]) 04/07/2019  . Depression 04/07/2019  . Obesity due to excess calories without serious comorbidity 04/07/2019  . Left leg pain 04/07/2019  . Anxiety 04/07/2019  . Headache 07/06/2018  . Diabetes ([place]) 12/08/2015  . Essential hypertension 12/08/2015  . Smoker 12/08/2015  . LLQ pain 12/08/2015  . Paresthesia 09/16/2015  . Urinary urgency 09/16/2015    Past Medical History:  Diagnosis Date  . Anemia    iron deficiency  . Diabetes ([place])   . Edema   . Migraine headache     Family History  Problem Relation Age of Onset  . Diabetes Mother   . Hypertension Mother   . Diabetes Father   . Hypertension Father   . Kidney failure Father   . Hypertension Brother   . Diabetes Maternal Grandmother   . Pancreatic cancer Maternal Grandmother   . Diabetes Paternal Grandmother   . Breast cancer Paternal Grandmother   . Stroke Paternal Grandfather   . Cancer Maternal Aunt        breast  . Breast cancer Maternal Aunt   . Healthy Son   . Healthy Son    Past Surgical History:  Procedure Laterality Date  . ABDOMINAL HYSTERECTOMY    . CHOLECYSTECTOMY    . 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 She lives with her 2 children in a one story house. She drinks one large cup of coffee a day and an occasional tea or soda. She walks daily.      One story home      Immunization History  Administered Date(s) Administered  . HPV 9-valent 04/11/2019  . Influenza Inj Mdck Quad Pf 05/18/2018  . Influenza,inj,Quad PF,6+ Mos 05/20/2017, 05/14/2019, 06/02/2020  . Influenza-Unspecified 05/11/2017, 05/15/2018, 05/08/2019  . Janssen (J&J) SARS-COV-2 Vaccination 11/23/2019  . PFIZER(Purple Top)SARS-COV-2 Vaccination 07/11/2020  . Pneumococcal  Polysaccharide-23 04/08/2019, 07/15/2019  . Tdap 07/02/2017, 04/09/2019     Objective: Vital Signs: BP 122/82 (BP Location: Right Arm, Patient Position: Sitting, Cuff Size: Normal)   Pulse 79   Resp 15   Ht 5' 6.5" (1.689 m)   Wt 220 lb (99.8 kg)   BMI 34.98 kg/m    Physical Exam Vitals and nursing note reviewed.  Constitutional:      Appearance: She is well-developed.  HENT:     Head: Normocephalic and atraumatic.  Eyes:     Conjunctiva/sclera: Conjunctivae normal.  Cardiovascular:     Rate and Rhythm: Normal rate and regular rhythm.     Heart sounds: Normal heart sounds.  Pulmonary:     Effort: Pulmonary effort is normal.     Breath sounds: Normal breath sounds.  Abdominal:     General: Bowel sounds are normal.     Palpations: Abdomen is soft.  Musculoskeletal:     Cervical back: Normal range of motion.  Lymphadenopathy:     Cervical: No cervical adenopathy.  Skin:    General: Skin is warm and dry.     Capillary Refill: Capillary refill takes less than 2 seconds.  Neurological:  Mental Status: She is alert and oriented to person, place, and time.  Psychiatric:        Behavior: Behavior normal.      Musculoskeletal Exam: She has limited lateral rotation of her cervical spine with a stiffness.  She had bilateral trapezius spasm.  She has limited range of motion of thoracic and lumbar spine.  Shoulder joints, elbow joints, wrist joints, MCPs PIPs and DIPs with good range of motion with no synovitis.  Hip joints, knee joints, ankles, MTPs and PIPs with good range of motion with no synovitis.  She had generalized hyperalgesia and positive tender points.  CDAI Exam: CDAI Score: -- Patient Global: --; Provider Global: -- Swollen: --; Tender: -- Joint Exam 12/06/2020   No joint exam has been documented for this visit   There is currently no information documented on the homunculus. Go to the Rheumatology activity and complete the homunculus joint  exam.  Investigation: No additional findings.  Imaging: DG Cervical Spine 2 or 3 views  Result Date: 11/13/2020 CLINICAL DATA:  Cervical pain.  No known injury. EXAM: CERVICAL SPINE - 2-3 VIEW COMPARISON:  None FINDINGS: Straightening of normal lordosis. No other malalignment. No fractures. Mild degenerative disc disease with small anterior osteophytes at C5-6, C6-7, and C7-T1. The lung apices are normal. No other abnormalities. IMPRESSION: Mild degenerative changes.  No other abnormalities. Electronically Signed   By: Dorise Bullion III M.D   On: 11/13/2020 14:47   DG Knee 1-2 Views Left  Result Date: 11/13/2020 CLINICAL DATA:  Pain. EXAM: LEFT KNEE - 1-2 VIEW COMPARISON:  None. FINDINGS: No evidence of fracture, dislocation, or joint effusion. No evidence of arthropathy or other focal bone abnormality. Soft tissues are unremarkable. IMPRESSION: Negative. Electronically Signed   By: Dorise Bullion III M.D   On: 11/13/2020 14:46   DG Knee 1-2 Views Right  Result Date: 11/13/2020 CLINICAL DATA:  Bilateral knee pain. EXAM: RIGHT KNEE - 1-2 VIEW COMPARISON:  None. FINDINGS: No evidence of fracture, dislocation, or joint effusion. No evidence of arthropathy or other focal bone abnormality. Soft tissues are unremarkable. IMPRESSION: Negative. Electronically Signed   By: Dorise Bullion III M.D   On: 11/13/2020 14:45    Recent Labs: Lab Results  Component Value Date   WBC 9.6 10/12/2020   HGB 13.8 10/12/2020   PLT 336 10/12/2020   NA 137 10/12/2020   K 3.3 (L) 10/12/2020   CL 104 10/12/2020   CO2 19 (L) 10/12/2020   GLUCOSE 106 (H) 10/12/2020   BUN 8 10/12/2020   CREATININE 0.65 10/12/2020   BILITOT 0.4 10/12/2020   ALKPHOS 107 10/12/2020   AST 21 10/12/2020   ALT 18 10/12/2020   PROT 7.9 10/12/2020   ALBUMIN 4.4 10/12/2020   CALCIUM 9.8 10/12/2020   GFRAA 89 07/15/2020    Speciality Comments: No specialty comments available.  Procedures:  No procedures performed Allergies:  Patient has no known allergies.   Assessment / Plan:     Visit Diagnoses: Polyarthralgia -he complains of pain and discomfort in multiple joints.  All autoimmune work-up has been negative.  10/05/20: ESR 13, RF<10, CRP 10, dsDNA<1, C3 187, ANA negative  Myofascial pain-she is concerned that she may have fibromyalgia.  She has generalized pain, hyperalgesia and discomfort all over.  Her clinical findings are consistent with fibromyalgia syndrome.  I will refer her to physical therapy.  Need for regular exercise and stretching was emphasized.  Water aerobics were also discussed.  She is already on  Cymbalta and Topamax which should be helpful.  Myalgia -she complains of muscle tenderness.  She had difficulty getting out of the chair and from the squatting position due to lower back pain.  Plan: CK, TSH, Serum protein electrophoresis with reflex  DDD (degenerative disc disease), cervical -she complains of neck discomfort.  I reviewed her x-rays from November 11, 2020.  X-ray of the cervical spine showed mild anterior osteophytes.  A handout on neck exercises was given.  DDD (degenerative disc disease), lumbar -I reviewed her x-rays from May 09, 2020.  MRI of the lumbar spine showed mild disc disease and L5-S1 bilateral foraminal stenosis.  Mild levoscoliosis.  A handout on back exercises was given.  Chronic pain of both knees -she also complains of bilateral knee joint discomfort for the last few years since her fall.  I reviewed her x-rays from November 11, 2020.  X-ray of bilateral knee joints were unremarkable.  Essential hypertension-her blood pressure is within normal limits.  Type 2 diabetes mellitus without complication, without long-term current use of insulin ([facility])  Paresthesia-she has longstanding history of paresthesias in her hands and feet.  Gastroesophageal reflux disease with esophagitis without hemorrhage-she is on Pepcid but is not having many symptoms currently.  Hx of migraine  headaches-she has been seeing Dr. Tomi Likens for last few years.  Increased intracranial pressure-followed by Dr. Tomi Likens  Anxiety and depression-she states her symptoms are manageable.  PTSD (post-traumatic stress disorder)-she has been under a lot of stress due to murder of her mother recently.  Attention deficit hyperactivity disorder (ADHD), predominantly inattentive type-she takes Adderall.  Vitamin D deficiency-she is on vitamin D supplement.  Former smoker - 1/2 PPDx 25 years, quit 2021  Orders: Orders Placed This Encounter  Procedures  . CK  . TSH  . Serum protein electrophoresis with reflex   No orders of the defined types were placed in this encounter.  .  Follow-Up Instructions: Return for Myalgia, arthralgia.   Bo Merino, MD  Note - This record has been created using Editor, commissioning.  Chart creation errors have been sought, but may not always  have been located. Such creation errors do not reflect on  the standard of medical care.

## 2020-11-23 ENCOUNTER — Other Ambulatory Visit: Payer: Self-pay | Admitting: Nurse Practitioner

## 2020-11-23 ENCOUNTER — Telehealth: Payer: Self-pay | Admitting: Nurse Practitioner

## 2020-11-23 DIAGNOSIS — Z800 Family history of malignant neoplasm of digestive organs: Secondary | ICD-10-CM

## 2020-11-23 DIAGNOSIS — R1310 Dysphagia, unspecified: Secondary | ICD-10-CM

## 2020-11-23 NOTE — Telephone Encounter (Signed)
Called to confirm the request for a colonoscopy. Reports her grandmother died from colon and pancreatic cancer. Her aunt is now being treated for colon cancer and her father had rectal cancer. She had a colonoscopy done with Dr Collene Mares but would like to go to a GI specialist closer to home and has scheduled at [facility].  She is also having difficulty with swallowing. Will make referral to [facility] at the patient's request. Will try to obtain records from Dr. Collene Mares from her previous colonoscopy.

## 2020-12-03 ENCOUNTER — Ambulatory Visit (INDEPENDENT_AMBULATORY_CARE_PROVIDER_SITE_OTHER): Payer: No Typology Code available for payment source

## 2020-12-03 ENCOUNTER — Telehealth: Payer: Self-pay | Admitting: Neurology

## 2020-12-03 ENCOUNTER — Other Ambulatory Visit: Payer: Self-pay

## 2020-12-03 ENCOUNTER — Other Ambulatory Visit: Payer: Self-pay | Admitting: Nurse Practitioner

## 2020-12-03 ENCOUNTER — Telehealth: Payer: Self-pay

## 2020-12-03 DIAGNOSIS — G43009 Migraine without aura, not intractable, without status migrainosus: Secondary | ICD-10-CM | POA: Diagnosis not present

## 2020-12-03 DIAGNOSIS — Z1231 Encounter for screening mammogram for malignant neoplasm of breast: Secondary | ICD-10-CM

## 2020-12-03 MED ORDER — METOCLOPRAMIDE HCL 5 MG/ML IJ SOLN
10.0000 mg | Freq: Once | INTRAMUSCULAR | Status: AC
  Administered 2020-12-03: 10 mg via INTRAMUSCULAR

## 2020-12-03 MED ORDER — KETOROLAC TROMETHAMINE 60 MG/2ML IM SOLN
60.0000 mg | Freq: Once | INTRAMUSCULAR | Status: AC
  Administered 2020-12-03: 60 mg via INTRAMUSCULAR

## 2020-12-03 MED ORDER — DIPHENHYDRAMINE HCL 50 MG/ML IJ SOLN
25.0000 mg | Freq: Once | INTRAMUSCULAR | Status: AC
  Administered 2020-12-03: 25 mg via INTRAMUSCULAR

## 2020-12-03 NOTE — Telephone Encounter (Signed)
ok 

## 2020-12-03 NOTE — Telephone Encounter (Signed)
Patient called requesting to come in for a headache cocktail today. She does have a driver.

## 2020-12-03 NOTE — Telephone Encounter (Signed)
Patient came in today for headache cocktail.   She dropped off FMLA papers that she states she will need faxed by 12/15/20.   She also requested Dr Virginia Douglas order a MRI on her Brain because she thinks there is fluid in her ear and it may be causing headaches.   I advised her that I would speak with Dr Virginia Douglas and we would get back to her. She voiced understanding.

## 2020-12-03 NOTE — Telephone Encounter (Signed)
Spoke with patient and made her aware that Dr Tomi Likens is agreeable with the patient coming in for headache cocktail.   Patient notified to bring driver in with her and voiced understanding.

## 2020-12-05 NOTE — Telephone Encounter (Signed)
If she is reporting fluid in her ear, then she needs to see her PCP or ask PCP for ENT referral

## 2020-12-06 ENCOUNTER — Other Ambulatory Visit: Payer: Self-pay | Admitting: Nurse Practitioner

## 2020-12-06 ENCOUNTER — Encounter: Payer: Self-pay | Admitting: Rheumatology

## 2020-12-06 ENCOUNTER — Ambulatory Visit (INDEPENDENT_AMBULATORY_CARE_PROVIDER_SITE_OTHER): Payer: No Typology Code available for payment source | Admitting: Rheumatology

## 2020-12-06 ENCOUNTER — Other Ambulatory Visit: Payer: Self-pay

## 2020-12-06 VITALS — BP 122/82 | HR 79 | Resp 15 | Ht 66.5 in | Wt 220.0 lb

## 2020-12-06 DIAGNOSIS — F17200 Nicotine dependence, unspecified, uncomplicated: Secondary | ICD-10-CM

## 2020-12-06 DIAGNOSIS — G932 Benign intracranial hypertension: Secondary | ICD-10-CM

## 2020-12-06 DIAGNOSIS — I10 Essential (primary) hypertension: Secondary | ICD-10-CM

## 2020-12-06 DIAGNOSIS — K2100 Gastro-esophageal reflux disease with esophagitis, without bleeding: Secondary | ICD-10-CM

## 2020-12-06 DIAGNOSIS — M51369 Other intervertebral disc degeneration, lumbar region without mention of lumbar back pain or lower extremity pain: Secondary | ICD-10-CM

## 2020-12-06 DIAGNOSIS — M791 Myalgia, unspecified site: Secondary | ICD-10-CM

## 2020-12-06 DIAGNOSIS — M5030 Other cervical disc degeneration, unspecified cervical region: Secondary | ICD-10-CM

## 2020-12-06 DIAGNOSIS — E559 Vitamin D deficiency, unspecified: Secondary | ICD-10-CM

## 2020-12-06 DIAGNOSIS — Z1231 Encounter for screening mammogram for malignant neoplasm of breast: Secondary | ICD-10-CM

## 2020-12-06 DIAGNOSIS — M5136 Other intervertebral disc degeneration, lumbar region: Secondary | ICD-10-CM

## 2020-12-06 DIAGNOSIS — M2550 Pain in unspecified joint: Secondary | ICD-10-CM

## 2020-12-06 DIAGNOSIS — E119 Type 2 diabetes mellitus without complications: Secondary | ICD-10-CM

## 2020-12-06 DIAGNOSIS — Z87891 Personal history of nicotine dependence: Secondary | ICD-10-CM

## 2020-12-06 DIAGNOSIS — M25512 Pain in left shoulder: Secondary | ICD-10-CM

## 2020-12-06 DIAGNOSIS — F419 Anxiety disorder, unspecified: Secondary | ICD-10-CM

## 2020-12-06 DIAGNOSIS — M7918 Myalgia, other site: Secondary | ICD-10-CM | POA: Diagnosis not present

## 2020-12-06 DIAGNOSIS — M25562 Pain in left knee: Secondary | ICD-10-CM

## 2020-12-06 DIAGNOSIS — R202 Paresthesia of skin: Secondary | ICD-10-CM

## 2020-12-06 DIAGNOSIS — G8929 Other chronic pain: Secondary | ICD-10-CM

## 2020-12-06 DIAGNOSIS — F4310 Post-traumatic stress disorder, unspecified: Secondary | ICD-10-CM

## 2020-12-06 DIAGNOSIS — Z8669 Personal history of other diseases of the nervous system and sense organs: Secondary | ICD-10-CM

## 2020-12-06 DIAGNOSIS — M25561 Pain in right knee: Secondary | ICD-10-CM

## 2020-12-06 DIAGNOSIS — F900 Attention-deficit hyperactivity disorder, predominantly inattentive type: Secondary | ICD-10-CM | POA: Insufficient documentation

## 2020-12-06 DIAGNOSIS — F32A Depression, unspecified: Secondary | ICD-10-CM

## 2020-12-06 NOTE — Telephone Encounter (Signed)
LM for pt to call the office back.

## 2020-12-06 NOTE — Addendum Note (Signed)
Addended by: Earnestine Mealing on: 12/06/2020 02:38 PM   Modules accepted: Orders

## 2020-12-06 NOTE — Patient Instructions (Signed)
Cervical Strain and Sprain Rehab Ask your health care provider which exercises are safe for you. Do exercises exactly as told by your health care provider and adjust them as directed. It is normal to feel mild stretching, pulling, tightness, or discomfort as you do these exercises. Stop right away if you feel sudden pain or your pain gets worse. Do not begin these exercises until told by your health care provider. Stretching and range-of-motion exercises Cervical side bending 1. Using good posture, sit on a stable chair or stand up. 2. Without moving your shoulders, slowly tilt your left / right ear to your shoulder until you feel a stretch in the opposite side neck muscles. You should be looking straight ahead. 3. Hold for __________ seconds. 4. Repeat with the other side of your neck. Repeat __________ times. Complete this exercise __________ times a day.   Cervical rotation 1. Using good posture, sit on a stable chair or stand up. 2. Slowly turn your head to the side as if you are looking over your left / right shoulder. ? Keep your eyes level with the ground. ? Stop when you feel a stretch along the side and the back of your neck. 3. Hold for __________ seconds. 4. Repeat this by turning to your other side. Repeat __________ times. Complete this exercise __________ times a day.   Thoracic extension and pectoral stretch 1. Roll a towel or a small blanket so it is about 4 inches (10 cm) in diameter. 2. Lie down on your back on a firm surface. 3. Put the towel lengthwise, under your spine in the middle of your back. It should not be under your shoulder blades. The towel should line up with your spine from your middle back to your lower back. 4. Put your hands behind your head and let your elbows fall out to your sides. 5. Hold for __________ seconds. Repeat __________ times. Complete this exercise __________ times a day. Strengthening exercises Isometric upper cervical flexion 1. Lie on  your back with a thin pillow behind your head and a small rolled-up towel under your neck. 2. Gently tuck your chin toward your chest and nod your head down to look toward your feet. Do not lift your head off the pillow. 3. Hold for __________ seconds. 4. Release the tension slowly. Relax your neck muscles completely before you repeat this exercise. Repeat __________ times. Complete this exercise __________ times a day. Isometric cervical extension 1. Stand about 6 inches (15 cm) away from a wall, with your back facing the wall. 2. Place a soft object, about 6-8 inches (15-20 cm) in diameter, between the back of your head and the wall. A soft object could be a small pillow, a ball, or a folded towel. 3. Gently tilt your head back and press into the soft object. Keep your jaw and forehead relaxed. 4. Hold for __________ seconds. 5. Release the tension slowly. Relax your neck muscles completely before you repeat this exercise. Repeat __________ times. Complete this exercise __________ times a day.   Posture and body mechanics Body mechanics refers to the movements and positions of your body while you do your daily activities. Posture is part of body mechanics. Good posture and healthy body mechanics can help to relieve stress in your body's tissues and joints. Good posture means that your spine is in its natural S-curve position (your spine is neutral), your shoulders are pulled back slightly, and your head is not tipped forward. The following are general guidelines  for applying improved posture and body mechanics to your everyday activities. Sitting 1. When sitting, keep your spine neutral and keep your feet flat on the floor. Use a footrest, if necessary, and keep your thighs parallel to the floor. Avoid rounding your shoulders, and avoid tilting your head forward. 2. When working at a desk or a computer, keep your desk at a height where your hands are slightly lower than your elbows. Slide your  chair under your desk so you are close enough to maintain good posture. 3. When working at a computer, place your monitor at a height where you are looking straight ahead and you do not have to tilt your head forward or downward to look at the screen.   Standing  When standing, keep your spine neutral and keep your feet about hip-width apart. Keep a slight bend in your knees. Your ears, shoulders, and hips should line up.  When you do a task in which you stand in one place for a long time, place one foot up on a stable object that is 2-4 inches (5-10 cm) high, such as a footstool. This helps keep your spine neutral.   Resting When lying down and resting, avoid positions that are most painful for you. Try to support your neck in a neutral position. You can use a contour pillow or a small rolled-up towel. Your pillow should support your neck but not push on it. This information is not intended to replace advice given to you by your health care provider. Make sure you discuss any questions you have with your health care provider. Document Revised: 12/11/2018 Document Reviewed: 05/22/2018 Elsevier Patient Education  2021 [place]. Back Exercises The following exercises strengthen the muscles that help to support the trunk and back. They also help to keep the lower back flexible. Doing these exercises can help to prevent back pain or lessen existing pain.  If you have back pain or discomfort, try doing these exercises 2-3 times each day or as told by your health care provider.  As your pain improves, do them once each day, but increase the number of times that you repeat the steps for each exercise (do more repetitions).  To prevent the recurrence of back pain, continue to do these exercises once each day or as told by your health care provider. Do exercises exactly as told by your health care provider and adjust them as directed. It is normal to feel mild stretching, pulling, tightness, or  discomfort as you do these exercises, but you should stop right away if you feel sudden pain or your pain gets worse. Exercises Single knee to chest Repeat these steps 3-5 times for each leg: 5. Lie on your back on a firm bed or the floor with your legs extended. 6. Bring one knee to your chest. Your other leg should stay extended and in contact with the floor. 7. Hold your knee in place by grabbing your knee or thigh with both hands and hold. 8. Pull on your knee until you feel a gentle stretch in your lower back or buttocks. 9. Hold the stretch for 10-30 seconds. 10. Slowly release and straighten your leg. Pelvic tilt Repeat these steps 5-10 times: 6. Lie on your back on a firm bed or the floor with your legs extended. 7. Bend your knees so they are pointing toward the ceiling and your feet are flat on the floor. 8. Tighten your lower abdominal muscles to press your lower back against the  floor. This motion will tilt your pelvis so your tailbone points up toward the ceiling instead of pointing to your feet or the floor. 9. With gentle tension and even breathing, hold this position for 5-10 seconds. Cat-cow Repeat these steps until your lower back becomes more flexible: 5. Get into a hands-and-knees position on a firm surface. Keep your hands under your shoulders, and keep your knees under your hips. You may place padding under your knees for comfort. 6. Let your head hang down toward your chest. Contract your abdominal muscles and point your tailbone toward the floor so your lower back becomes rounded like the back of a cat. 7. Hold this position for 5 seconds. 8. Slowly lift your head, let your abdominal muscles relax and point your tailbone up toward the ceiling so your back forms a sagging arch like the back of a cow. 9. Hold this position for 5 seconds.   Press-ups Repeat these steps 5-10 times: 6. Lie on your abdomen (face-down) on the floor. 7. Place your palms near your head, about  shoulder-width apart. 8. Keeping your back as relaxed as possible and keeping your hips on the floor, slowly straighten your arms to raise the top half of your body and lift your shoulders. Do not use your back muscles to raise your upper torso. You may adjust the placement of your hands to make yourself more comfortable. 9. Hold this position for 5 seconds while you keep your back relaxed. 10. Slowly return to lying flat on the floor.   Bridges Repeat these steps 10 times: 4. Lie on your back on a firm surface. 5. Bend your knees so they are pointing toward the ceiling and your feet are flat on the floor. Your arms should be flat at your sides, next to your body. 6. Tighten your buttocks muscles and lift your buttocks off the floor until your waist is at almost the same height as your knees. You should feel the muscles working in your buttocks and the back of your thighs. If you do not feel these muscles, slide your feet 1-2 inches farther away from your buttocks. 7. Hold this position for 3-5 seconds. 8. Slowly lower your hips to the starting position, and allow your buttocks muscles to relax completely. If this exercise is too easy, try doing it with your arms crossed over your chest.   Abdominal crunches Repeat these steps 5-10 times: 1. Lie on your back on a firm bed or the floor with your legs extended. 2. Bend your knees so they are pointing toward the ceiling and your feet are flat on the floor. 3. Cross your arms over your chest. 4. Tip your chin slightly toward your chest without bending your neck. 5. Tighten your abdominal muscles and slowly raise your trunk (torso) high enough to lift your shoulder blades a tiny bit off the floor. Avoid raising your torso higher than that because it can put too much stress on your low back and does not help to strengthen your abdominal muscles. 6. Slowly return to your starting position. Back lifts Repeat these steps 5-10 times: 1. Lie on your  abdomen (face-down) with your arms at your sides, and rest your forehead on the floor. 2. Tighten the muscles in your legs and your buttocks. 3. Slowly lift your chest off the floor while you keep your hips pressed to the floor. Keep the back of your head in line with the curve in your back. Your eyes should be looking  at the floor. 4. Hold this position for 3-5 seconds. 5. Slowly return to your starting position. Contact a health care provider if:  Your back pain or discomfort gets much worse when you do an exercise.  Your worsening back pain or discomfort does not lessen within 2 hours after you exercise. If you have any of these problems, stop doing these exercises right away. Do not do them again unless your health care provider says that you can. Get help right away if:  You develop sudden, severe back pain. If this happens, stop doing the exercises right away. Do not do them again unless your health care provider says that you can. This information is not intended to replace advice given to you by your health care provider. Make sure you discuss any questions you have with your health care provider. Document Revised: 12/26/2018 Document Reviewed: 05/23/2018 Elsevier Patient Education  [place].

## 2020-12-06 NOTE — Telephone Encounter (Signed)
Dr. Venetia Maxon needs to first send me his note.

## 2020-12-08 ENCOUNTER — Other Ambulatory Visit: Payer: Self-pay | Admitting: Neurology

## 2020-12-08 LAB — PROTEIN ELECTROPHORESIS, SERUM, WITH REFLEX
Albumin ELP: 3.8 g/dL (ref 3.8–4.8)
Alpha 1: 0.3 g/dL (ref 0.2–0.3)
Alpha 2: 1 g/dL — ABNORMAL HIGH (ref 0.5–0.9)
Beta 2: 0.3 g/dL (ref 0.2–0.5)
Beta Globulin: 0.4 g/dL (ref 0.4–0.6)
Gamma Globulin: 0.7 g/dL — ABNORMAL LOW (ref 0.8–1.7)
Total Protein: 6.5 g/dL (ref 6.1–8.1)

## 2020-12-08 LAB — TSH: TSH: 1.14 mIU/L

## 2020-12-08 LAB — CK: Total CK: 117 U/L (ref 29–143)

## 2020-12-08 NOTE — Progress Notes (Signed)
All the labs are within normal limits.  I will discuss results at the follow-up visit.

## 2020-12-09 ENCOUNTER — Other Ambulatory Visit: Payer: Self-pay | Admitting: Neurology

## 2020-12-09 ENCOUNTER — Other Ambulatory Visit: Payer: Self-pay | Admitting: Nurse Practitioner

## 2020-12-09 DIAGNOSIS — F32A Depression, unspecified: Secondary | ICD-10-CM

## 2020-12-09 MED ORDER — DULOXETINE HCL 60 MG PO CPEP: 60.0000 mg | ORAL_CAPSULE | Freq: Every day | ORAL | 5 refills | Status: DC

## 2020-12-09 NOTE — Telephone Encounter (Signed)
Discontinue duloxetine 30mg  pill.  Prescribe duloxetine 60mg  capsule once daily

## 2020-12-11 ENCOUNTER — Encounter: Payer: Self-pay | Admitting: Rheumatology

## 2020-12-13 NOTE — Telephone Encounter (Signed)
Dr. Estanislado Pandy will further discuss the additional symptoms the patient is experiencing at her upcoming new patient follow up visit.   She can try using oral gel OTC to alleviate some of the pain from the mouth sores.

## 2020-12-21 ENCOUNTER — Encounter: Payer: Self-pay | Admitting: Rheumatology

## 2020-12-21 ENCOUNTER — Encounter: Payer: Self-pay | Admitting: Nurse Practitioner

## 2020-12-21 NOTE — Telephone Encounter (Signed)
Patient needs an appt for a referral to Neurology

## 2020-12-22 ENCOUNTER — Telehealth: Payer: Self-pay | Admitting: Neurology

## 2020-12-22 NOTE — Telephone Encounter (Signed)
This isn't a new issue.  She is being worked up for numbness.  I had ordered an EMG which should be done prior to follow up.

## 2020-12-22 NOTE — Telephone Encounter (Signed)
Pt is scheduled for Emg first and to follow with Dr. Tomi Likens

## 2020-12-22 NOTE — Telephone Encounter (Signed)
Patient called and said she recently came out of a [employer] and had the tips of her fingers and toes went numb and were very painful. She was unable to drive home, she said, and had to call someone to come and get her.   Patient scheduled a follow up appointment about this on 01/07/21 at 8:50AM

## 2020-12-24 ENCOUNTER — Other Ambulatory Visit: Payer: Self-pay | Admitting: Neurology

## 2020-12-28 ENCOUNTER — Encounter: Payer: Self-pay | Admitting: Nurse Practitioner

## 2020-12-28 ENCOUNTER — Other Ambulatory Visit: Payer: Self-pay

## 2020-12-28 ENCOUNTER — Ambulatory Visit (INDEPENDENT_AMBULATORY_CARE_PROVIDER_SITE_OTHER): Payer: No Typology Code available for payment source | Admitting: Nurse Practitioner

## 2020-12-28 VITALS — BP 128/70 | HR 109 | Temp 98.7°F | Ht 66.5 in | Wt 221.8 lb

## 2020-12-28 DIAGNOSIS — F32A Depression, unspecified: Secondary | ICD-10-CM | POA: Diagnosis not present

## 2020-12-28 DIAGNOSIS — E119 Type 2 diabetes mellitus without complications: Secondary | ICD-10-CM

## 2020-12-28 DIAGNOSIS — Z6835 Body mass index (BMI) 35.0-35.9, adult: Secondary | ICD-10-CM | POA: Diagnosis not present

## 2020-12-28 HISTORY — PX: COLONOSCOPY: SHX174

## 2020-12-28 MED ORDER — OZEMPIC (1 MG/DOSE) 4 MG/3ML ~~LOC~~ SOPN: 1.0000 mg | PEN_INJECTOR | SUBCUTANEOUS | 1 refills | Status: DC

## 2020-12-28 NOTE — Patient Instructions (Signed)

## 2020-12-28 NOTE — Progress Notes (Signed)
I,Virginia Douglas as a Education administrator for [employer], FNP.,have documented all relevant documentation on the behalf of Virginia Brine, FNP,as directed by  Virginia Brine, FNP while in the presence of Virginia Douglas, [place].  This visit occurred during the SARS-CoV-2 public health emergency.  Safety protocols were in place, including screening questions prior to the visit, additional usage of staff PPE, and extensive cleaning of exam room while observing appropriate contact time as indicated for disinfecting solutions.  Subjective:     Patient ID: Virginia Douglas , female    DOB: Jun 29, 1976 , 45 y.o.   MRN: [id number]   Chief Complaint  Patient presents with  . Depression    HPI  Patient presents today for a f/u on her depression. She was recently started on Wellbutrin. She is tolerating the medication well. She feels like her appetite is the same.  She will eat yogurt for breakfast. She will go to chicken salad chick. Then she will wait to eat at night and lay down.  She had cut sodas out but will have once every 3 days. She will drink an increased amount of tea. She has just came out of her brace for her back and neck and was unable to exercise. She is in PT until the end of May.    Wt Readings from Last 3 Encounters: 12/28/20 : 221 lb 12.8 oz (100.6 kg) 12/06/20 : 220 lb (99.8 kg) 11/16/20 : 206 lb 6.4 oz (93.6 kg)  She had an endoscopy today - had her esophagus stretched  She has oxygen on standby  Depression        This is a recurrent problem.  The current episode started 1 to 4 weeks ago.   The onset quality is gradual.   Associated symptoms include no fatigue.     Exacerbated by: continues to have grief related to the loss of multiple family members.     Past Medical History:  Diagnosis Date  . Anemia    iron deficiency  . Diabetes ([place])   . Edema   . Migraine headache      Family History  Problem Relation Age of Onset  . Diabetes Mother   . Hypertension Mother    . Diabetes Father   . Hypertension Father   . Kidney failure Father   . Hypertension Brother   . Diabetes Maternal Grandmother   . Pancreatic cancer Maternal Grandmother   . Diabetes Paternal Grandmother   . Breast cancer Paternal Grandmother   . Stroke Paternal Grandfather   . Cancer Maternal Aunt        breast  . Breast cancer Maternal Aunt   . Healthy Son   . Healthy Son      Current Outpatient Medications:  .  Semaglutide, 1 MG/DOSE, (OZEMPIC, 1 MG/DOSE,) 4 MG/3ML SOPN, Inject 1 mg into the skin once a week., Disp: 4.5 mL, Rfl: 1 .  acetaZOLAMIDE (DIAMOX) 250 MG tablet, TAKE 1 TABLET BY MOUTH TWICE A DAY, Disp: 60 tablet, Rfl: 0 .  ADDERALL XR 30 MG 24 hr capsule, Take 30 mg by mouth daily., Disp: , Rfl:  .  AIMOVIG 140 MG/ML SOAJ, INJECT 140 MG INTO THE SKIN EVERY 30 DAYS., Disp: 1 mL, Rfl: 5 .  atorvastatin (LIPITOR) 10 MG tablet, TAKE 1 TABLET BY MOUTH EVERY DAY, Disp: 90 tablet, Rfl: 1 .  Blood Glucose Monitoring Suppl (ONETOUCH VERIO IQ SYSTEM) w/Device KIT, by Does not apply route. Use to check blood sugars twice, Disp: ,  Rfl:  .  buPROPion (WELLBUTRIN XL) 150 MG 24 hr tablet, TAKE 1 TABLET BY MOUTH EVERY DAY IN THE MORNING, Disp: 90 tablet, Rfl: 1 .  clonazePAM (KLONOPIN) 0.5 MG tablet, TAKE 1 TABLET BY MOUTH THREE TIMES A DAY AS NEEDED FOR ANXIETY, Disp: 30 tablet, Rfl: 1 .  DULoxetine (CYMBALTA) 60 MG capsule, Take 1 capsule (60 mg total) by mouth daily., Disp: 30 capsule, Rfl: 5 .  eletriptan (RELPAX) 40 MG tablet, TAKE 1 TABLET AS NEEDED FOR MIGRAINE/ HEADACHE. MAY REPEAT IN 2 HRS IF HEADACHE PERSISTS OR RECURS., Disp: 10 tablet, Rfl: 1 .  famotidine (PEPCID) 20 MG tablet, Take 1 tablet (20 mg total) by mouth 2 (two) times daily., Disp: 10 tablet, Rfl: 0 .  glucose blood test strip, 1 each by Other route as needed for other. Insert 1 by subcutaneous route 2 times every day check blood sugar before breakfast and dinner, Disp: , Rfl:  .  HYDROcodone-acetaminophen (NORCO)  10-325 MG tablet, Take 1 tablet by mouth as needed., Disp: , Rfl:  .  hydrOXYzine (VISTARIL) 50 MG capsule, TAKE 1 CAPSULE BY MOUTH AT BEDTIME AS NEEDED., Disp: 90 capsule, Rfl: 1 .  Insulin Pen Needle (PEN NEEDLES) 32G X 4 MM MISC, 1 each by Does not apply route once a week., Disp: 30 each, Rfl: 3 .  Lasmiditan Succinate (REYVOW) 100 MG TABS, Take 1 tablet by mouth daily as needed., Disp: 16 tablet, Rfl: 5 .  lisinopril-hydrochlorothiazide (ZESTORETIC) 10-12.5 MG tablet, TAKE 1 TABLET BY MOUTH EVERY DAY, Disp: 90 tablet, Rfl: 1 .  Loteprednol-Tobramycin (ZYLET OP), Apply to eye., Disp: , Rfl:  .  topiramate (TOPAMAX) 50 MG tablet, TAKE 2 AND 1/2 TABLETS (125 MG TOTAL) BY MOUTH AT BEDTIME., Disp: 225 tablet, Rfl: 0 .  TURMERIC PO, Take 1 capsule by mouth daily., Disp: , Rfl:  .  Varenicline Tartrate 0.03 MG/ACT SOLN, Place into the nose., Disp: , Rfl:  .  Vitamin D, Ergocalciferol, (DRISDOL) 1.25 MG (50000 UNIT) CAPS capsule, TAKE 1 CAPSULE (50,000 UNITS TOTAL) BY MOUTH EVERY 7 (SEVEN) DAYS, Disp: 12 capsule, Rfl: 0   No Known Allergies   Review of Systems  Constitutional: Negative.  Negative for fatigue.  Respiratory: Negative.   Cardiovascular: Negative.  Negative for chest pain, palpitations and leg swelling.  Gastrointestinal: Negative.   Neurological: Negative.   Psychiatric/Behavioral: Positive for depression.     Today's Vitals   12/28/20 1538  BP: 128/70  Pulse: (!) 109  Temp: 98.7 F (37.1 C)  TempSrc: Oral  Weight: 221 lb 12.8 oz (100.6 kg)  Height: 5' 6.5" (1.689 m)   Body mass index is 35.26 kg/m.  Wt Readings from Last 3 Encounters:  12/28/20 221 lb 12.8 oz (100.6 kg)  12/06/20 220 lb (99.8 kg)  11/16/20 206 lb 6.4 oz (93.6 kg)    Objective:  Physical Exam Vitals reviewed.  Constitutional:      General: She is not in acute distress.    Appearance: Normal appearance. She is well-developed. She is obese.  HENT:     Head: Normocephalic and atraumatic.  Eyes:      Pupils: Pupils are equal, round, and reactive to light.  Cardiovascular:     Rate and Rhythm: Normal rate and regular rhythm.     Pulses: Normal pulses.     Heart sounds: Normal heart sounds. No murmur heard.   Pulmonary:     Effort: Pulmonary effort is normal.     Breath sounds: Normal breath  sounds.  Abdominal:     General: Bowel sounds are normal.     Comments: Reflux in the morning and at night  Musculoskeletal:        General: No tenderness. Normal range of motion.  Skin:    General: Skin is warm and dry.     Capillary Refill: Capillary refill takes less than 2 seconds.  Neurological:     General: No focal deficit present.     Mental Status: She is alert and oriented to person, place, and time.     Cranial Nerves: No cranial nerve deficit.  Psychiatric:        Mood and Affect: Mood normal.        Behavior: Behavior normal.        Thought Content: Thought content normal.        Judgment: Judgment normal.     Depression screen [facility] 2/9 11/16/2020 07/15/2020 12/18/2019 10/16/2019 07/15/2019  Decreased Interest 3 3 1 3 3   Down, Depressed, Hopeless 3 2 1 1 3   PHQ - 2 Score 6 5 2 4 6   Altered sleeping 3 3 3 3 3   Tired, decreased energy 3 3 3 3 3   Change in appetite 3 2 3 3 3   Feeling bad or failure about yourself  3 0 1 0 1  Trouble concentrating 3 0 3 3 3   Moving slowly or fidgety/restless 3 2 0 3 0  Suicidal thoughts 3 0 0 0 0  PHQ-9 Score 27 15 15 19 19   Difficult doing work/chores Very difficult Somewhat difficult Very difficult Very difficult Very difficult  Some recent data might be hidden       Assessment And Plan:     1. Depression, unspecified depression type  She is tolerating wellbutrin well   2. Class 2 severe obesity due to excess calories with serious comorbidity and body mass index (BMI) of 35.0 to 35.9 in adult [facility])  I will refer to weight management as we have tried several medications to include phentermine, contrave and saxenda (insurance not  covered)  At this point will refer to weight management for other options.  She is encouraged to strive for BMI less than 30 to decrease cardiac risk. Advised to aim for at least 150 minutes of exercise per week. - Semaglutide, 1 MG/DOSE, (OZEMPIC, 1 MG/DOSE,) 4 MG/3ML SOPN; Inject 1 mg into the skin once a week.  Dispense: 4.5 mL; Refill: 1 - Amb Ref to Medical Weight Management  3. Type 2 diabetes mellitus without complication, without long-term current use of insulin (HCC)  Chronic, controlled  Continue with current medications  Encouraged to limit intake of sugary foods and drinks - Semaglutide, 1 MG/DOSE, (OZEMPIC, 1 MG/DOSE,) 4 MG/3ML SOPN; Inject 1 mg into the skin once a week.  Dispense: 4.5 mL; Refill: 1     Patient was given opportunity to ask questions. Patient verbalized understanding of the plan and was able to repeat key elements of the plan. All questions were answered to their satisfaction.  Virginia Brine, FNP   I, Virginia Brine, FNP, have reviewed all documentation for this visit. The documentation on 01/17/21 for the exam, diagnosis, procedures, and orders are all accurate and complete.   IF YOU HAVE BEEN REFERRED TO A SPECIALIST, IT MAY TAKE 1-2 WEEKS TO SCHEDULE/PROCESS THE REFERRAL. IF YOU HAVE NOT HEARD FROM US/SPECIALIST IN TWO WEEKS, PLEASE GIVE [place] A CALL AT [phone] X [phone].   THE PATIENT IS ENCOURAGED TO PRACTICE SOCIAL DISTANCING DUE TO  THE COVID-19 PANDEMIC.

## 2020-12-30 ENCOUNTER — Encounter: Payer: Self-pay | Admitting: Rheumatology

## 2020-12-30 NOTE — Progress Notes (Deleted)
Office Visit Note  Patient: Virginia Douglas             Date of Birth: 1976-07-24           MRN: [id number]             PCP: [name], FNP Referring: [name], FNP Visit Date: 01/03/2021 Occupation: _0 @  Subjective:  No chief complaint on file.   History of Present Illness: Virginia Douglas is a 45 y.o. female ***   Activities of Daily Living:  Patient reports morning stiffness for *** {minute/hour:19697}.   Patient {ACTIONS;DENIES/REPORTS:21021675::"Denies"} nocturnal pain.  Difficulty dressing/grooming: {ACTIONS;DENIES/REPORTS:21021675::"Denies"} Difficulty climbing stairs: {ACTIONS;DENIES/REPORTS:21021675::"Denies"} Difficulty getting out of chair: {ACTIONS;DENIES/REPORTS:21021675::"Denies"} Difficulty using hands for taps, buttons, cutlery, and/or writing: {ACTIONS;DENIES/REPORTS:21021675::"Denies"}  No Rheumatology ROS completed.   PMFS History:  Patient Active Problem List   Diagnosis Date Noted  . DDD (degenerative disc disease), cervical 12/06/2020  . DDD (degenerative disc disease), lumbar 12/06/2020  . Gastroesophageal reflux disease with esophagitis without hemorrhage 12/06/2020  . Increased intracranial pressure 12/06/2020  . PTSD (post-traumatic stress disorder) 12/06/2020  . Attention deficit hyperactivity disorder (ADHD), predominantly inattentive type 12/06/2020  . Vitamin D deficiency 12/06/2020  . Obesity (BMI 30-39.9) 03/15/2020  . Sciatic nerve pain 12/10/2019  . Acute pain of left shoulder 06/05/2019  . Grief 06/05/2019  . Migraine 06/05/2019  . Type 2 diabetes mellitus without complication, without long-term current use of insulin ([place]) 04/07/2019  . Depression 04/07/2019  . Obesity due to excess calories without serious comorbidity 04/07/2019  . Left leg pain 04/07/2019  . Anxiety 04/07/2019  . Headache 07/06/2018  . Diabetes ([place]) 12/08/2015  . Essential hypertension 12/08/2015  . Smoker 12/08/2015  . LLQ pain  12/08/2015  . Paresthesia 09/16/2015  . Urinary urgency 09/16/2015    Past Medical History:  Diagnosis Date  . Anemia    iron deficiency  . Diabetes ([place])   . Edema   . Migraine headache     Family History  Problem Relation Age of Onset  . Diabetes Mother   . Hypertension Mother   . Diabetes Father   . Hypertension Father   . Kidney failure Father   . Hypertension Brother   . Diabetes Maternal Grandmother   . Pancreatic cancer Maternal Grandmother   . Diabetes Paternal Grandmother   . Breast cancer Paternal Grandmother   . Stroke Paternal Grandfather   . Cancer Maternal Aunt        breast  . Breast cancer Maternal Aunt   . Healthy Son   . Healthy Son    Past Surgical History:  Procedure Laterality Date  . ABDOMINAL HYSTERECTOMY    . CHOLECYSTECTOMY    . 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 She lives with her 2 children in a one story house. She drinks one large cup of coffee a day and an occasional tea or soda. She walks daily.      One story home      Immunization History  Administered Date(s) Administered  . HPV 9-valent 04/11/2019  . Influenza Inj Mdck Quad Pf 05/18/2018  . Influenza,inj,Quad PF,6+ Mos 05/20/2017, 05/14/2019, 06/02/2020  . Influenza-Unspecified 05/11/2017, 05/15/2018, 05/08/2019  . Janssen (J&J) SARS-COV-2 Vaccination 11/23/2019  .  PFIZER(Purple Top)SARS-COV-2 Vaccination 07/11/2020  . Pneumococcal Polysaccharide-23 04/08/2019, 07/15/2019  . Tdap 07/02/2017, 04/09/2019     Objective: Vital Signs: There were no vitals taken for this visit.   Physical Exam   Musculoskeletal Exam: ***  CDAI Exam: CDAI Score: -- Patient Global: --; Provider Global: -- Swollen: --; Tender: -- Joint Exam 01/03/2021   No  joint exam has been documented for this visit   There is currently no information documented on the homunculus. Go to the Rheumatology activity and complete the homunculus joint exam.  Investigation: No additional findings.  Imaging: No results found.  Recent Labs: Lab Results  Component Value Date   WBC 9.6 10/12/2020   HGB 13.8 10/12/2020   PLT 336 10/12/2020   NA 137 10/12/2020   K 3.3 (L) 10/12/2020   CL 104 10/12/2020   CO2 19 (L) 10/12/2020   GLUCOSE 106 (H) 10/12/2020   BUN 8 10/12/2020   CREATININE 0.65 10/12/2020   BILITOT 0.4 10/12/2020   ALKPHOS 107 10/12/2020   AST 21 10/12/2020   ALT 18 10/12/2020   PROT 6.5 12/06/2020   ALBUMIN 4.4 10/12/2020   CALCIUM 9.8 10/12/2020   GFRAA 89 07/15/2020   December 06, 2020 SPEP normal, TSH normal  10/05/20: ESR 13, RF<10, CRP 10, dsDNA<1, C3 187, ANA negative  Speciality Comments: No specialty comments available.  Procedures:  No procedures performed Allergies: Patient has no known allergies.   Assessment / Plan:     Visit Diagnoses: No diagnosis found.  Orders: No orders of the defined types were placed in this encounter.  No orders of the defined types were placed in this encounter.   Face-to-face time spent with patient was *** minutes. Greater than 50% of time was spent in counseling and coordination of care.  Follow-Up Instructions: No follow-ups on file.   Bo Merino, MD  Note - This record has been created using Editor, commissioning.  Chart creation errors have been sought, but may not always  have been located. Such creation errors do not reflect on  the standard of medical care.

## 2020-12-31 ENCOUNTER — Encounter: Payer: Self-pay | Admitting: Nurse Practitioner

## 2020-12-31 ENCOUNTER — Other Ambulatory Visit: Payer: Self-pay | Admitting: Nurse Practitioner

## 2020-12-31 ENCOUNTER — Other Ambulatory Visit: Payer: Self-pay | Admitting: Neurology

## 2020-12-31 DIAGNOSIS — G4709 Other insomnia: Secondary | ICD-10-CM

## 2021-01-01 ENCOUNTER — Other Ambulatory Visit: Payer: Self-pay | Admitting: Neurology

## 2021-01-03 ENCOUNTER — Ambulatory Visit: Payer: No Typology Code available for payment source | Admitting: Rheumatology

## 2021-01-03 ENCOUNTER — Encounter: Payer: Self-pay | Admitting: Neurology

## 2021-01-03 DIAGNOSIS — F32A Depression, unspecified: Secondary | ICD-10-CM

## 2021-01-03 DIAGNOSIS — M2550 Pain in unspecified joint: Secondary | ICD-10-CM

## 2021-01-03 DIAGNOSIS — E559 Vitamin D deficiency, unspecified: Secondary | ICD-10-CM

## 2021-01-03 DIAGNOSIS — E119 Type 2 diabetes mellitus without complications: Secondary | ICD-10-CM

## 2021-01-03 DIAGNOSIS — M5030 Other cervical disc degeneration, unspecified cervical region: Secondary | ICD-10-CM

## 2021-01-03 DIAGNOSIS — M791 Myalgia, unspecified site: Secondary | ICD-10-CM

## 2021-01-03 DIAGNOSIS — M5136 Other intervertebral disc degeneration, lumbar region: Secondary | ICD-10-CM

## 2021-01-03 DIAGNOSIS — F900 Attention-deficit hyperactivity disorder, predominantly inattentive type: Secondary | ICD-10-CM

## 2021-01-03 DIAGNOSIS — R202 Paresthesia of skin: Secondary | ICD-10-CM

## 2021-01-03 DIAGNOSIS — G932 Benign intracranial hypertension: Secondary | ICD-10-CM

## 2021-01-03 DIAGNOSIS — I10 Essential (primary) hypertension: Secondary | ICD-10-CM

## 2021-01-03 DIAGNOSIS — M7918 Myalgia, other site: Secondary | ICD-10-CM

## 2021-01-03 DIAGNOSIS — G8929 Other chronic pain: Secondary | ICD-10-CM

## 2021-01-03 DIAGNOSIS — F4310 Post-traumatic stress disorder, unspecified: Secondary | ICD-10-CM

## 2021-01-03 DIAGNOSIS — Z8669 Personal history of other diseases of the nervous system and sense organs: Secondary | ICD-10-CM

## 2021-01-03 DIAGNOSIS — K2100 Gastro-esophageal reflux disease with esophagitis, without bleeding: Secondary | ICD-10-CM

## 2021-01-03 DIAGNOSIS — Z87891 Personal history of nicotine dependence: Secondary | ICD-10-CM

## 2021-01-03 LAB — HM COLONOSCOPY

## 2021-01-03 NOTE — Progress Notes (Signed)
Virginia Douglas (Key: [id number]) Reyvow 50MG  tablets   Form [employer] Electronic PA Form (2017 NCPDP) Created 3 days ago Sent to Plan 3 days ago Plan Response 3 days ago Submit Clinical Questions 3 days ago Determination Favorable 21 minutes ago Message from [employer] Your PA request has been approved. Additional information will be provided in the approval communication. (Message [id number])  Valid from 01/03/21 to 01/03/22.

## 2021-01-04 ENCOUNTER — Telehealth: Payer: Self-pay

## 2021-01-04 ENCOUNTER — Other Ambulatory Visit: Payer: Self-pay

## 2021-01-04 ENCOUNTER — Ambulatory Visit (INDEPENDENT_AMBULATORY_CARE_PROVIDER_SITE_OTHER): Payer: No Typology Code available for payment source | Admitting: Neurology

## 2021-01-04 DIAGNOSIS — R200 Anesthesia of skin: Secondary | ICD-10-CM | POA: Diagnosis not present

## 2021-01-04 DIAGNOSIS — R202 Paresthesia of skin: Secondary | ICD-10-CM | POA: Diagnosis not present

## 2021-01-04 NOTE — Telephone Encounter (Signed)
I have cancelled the appt for 01-07-21

## 2021-01-04 NOTE — Procedures (Signed)
[facility]  [address], [address]  [place], [place] [address] Tel: [phone] Fax:  [phone] Test Date:  01/04/2021  Patient: Virginia Douglas DOB: October 21, 1975 Physician: Narda Amber, DO  Sex: Female Height: 5\' 6"  Ref Phys: Metta Clines, D.O.  ID#: [id number]   Technician:    Patient Complaints: This is a 45 year old female referred for evaluation of numbness in the hands and feet.  NCV & EMG Findings: Extensive electrodiagnostic testing of the right upper and lower extremity shows: 1. All sensory responses including the right median, ulnar, mixed palmar, sural, and superficial peroneal nerves are within normal limits. 2. All motor responses including the right median, ulnar, peroneal, and tibial nerves are within normal limits. 3. Right tibial H reflex study is within normal limits. 4. There is no evidence of active or chronic motor axonal loss changes affecting any of the tested muscles.  Motor unit configuration and recruitment pattern is within normal limits.  Impression: This is a normal study of the right upper and lower extremities.  In particular, there is no evidence of a sensorimotor polyneuropathy, carpal tunnel syndrome, or cervical/lumbosacral radiculopathy.   ___________________________ Narda Amber, DO    Nerve Conduction Studies Anti Sensory Summary Table   Stim Site NR Peak (ms) Norm Peak (ms) P-T Amp (V) Norm P-T Amp  Right Median Anti Sensory (2nd Digit)  32C  Wrist    3.3 <3.4 34.4 >20  Right Sup Peroneal Anti Sensory (Ant Lat Mall)  32C  12 cm    1.9 <4.5 11.1 >5  Right Sural Anti Sensory (Lat Mall)  32C  Calf    3.1 <4.5 23.3 >5  Right Ulnar Anti Sensory (5th Digit)  32C  Wrist    2.6 <3.1 42.1 >12   Motor Summary Table   Stim Site NR Onset (ms) Norm Onset (ms) O-P Amp (mV) Norm O-P Amp Site1 Site2 Delta-0 (ms) Dist (cm) Vel (m/s) Norm Vel (m/s)  Right Median Motor (Abd Poll Brev)  32C  Wrist    3.5 <3.9 9.6 >6 Elbow Wrist  4.9 29.0 59 >50  Elbow    8.4  9.1         Right Peroneal Motor (Ext Dig Brev)  32C  Ankle    4.5 <5.5 6.5 >3 B Fib Ankle 7.6 38.0 50 >40  B Fib    12.1  6.3  Poplt B Fib 1.2 8.0 67 >40  Poplt    13.3  6.3         Right Tibial Motor (Abd Hall Brev)  32C  Ankle    4.8 <6.0 14.2 >8 Knee Ankle 8.4 44.0 52 >40  Knee    13.2  11.6         Right Ulnar Motor (Abd Dig Minimi)  32C  Wrist    2.3 <3.1 9.6 >7 B Elbow Wrist 3.9 25.0 64 >50  B Elbow    6.2  9.4  A Elbow B Elbow 1.3 10.0 77 >50  A Elbow    7.5  8.7          Comparison Summary Table   Stim Site NR Peak (ms) Norm Peak (ms) P-T Amp (V) Site1 Site2 Delta-P (ms) Norm Delta (ms)  Right Median/Ulnar Palm Comparison (Wrist - 8cm)  32C  Median Palm    1.9 <2.2 44.2 Median Palm Ulnar Palm 0.3   Ulnar Palm    1.6 <2.2 12.9       H Reflex Studies   NR  H-Lat (ms) Lat Norm (ms) L-R H-Lat (ms)  Right Tibial (Gastroc)  32C     31.16 <35    EMG   Side Muscle Ins Act Fibs Psw Fasc Number Recrt Dur Dur. Amp Amp. Poly Poly. Comment  Right AntTibialis Nml Nml Nml Nml Nml Nml Nml Nml Nml Nml Nml Nml N/A  Right Gastroc Nml Nml Nml Nml Nml Nml Nml Nml Nml Nml Nml Nml N/A  Right Flex Dig Long Nml Nml Nml Nml Nml Nml Nml Nml Nml Nml Nml Nml N/A  Right RectFemoris Nml Nml Nml Nml Nml Nml Nml Nml Nml Nml Nml Nml N/A  Right GluteusMed Nml Nml Nml Nml Nml Nml Nml Nml Nml Nml Nml Nml N/A  Right 1stDorInt Nml Nml Nml Nml Nml Nml Nml Nml Nml Nml Nml Nml N/A  Right PronatorTeres Nml Nml Nml Nml Nml Nml Nml Nml Nml Nml Nml Nml N/A  Right Biceps Nml Nml Nml Nml Nml Nml Nml Nml Nml Nml Nml Nml N/A  Right Triceps Nml Nml Nml Nml Nml Nml Nml Nml Nml Nml Nml Nml N/A  Right Deltoid Nml Nml Nml Nml Nml Nml Nml Nml Nml Nml Nml Nml N/A      Waveforms:

## 2021-01-04 NOTE — Telephone Encounter (Signed)
Pt advised of her EMG. Per jaffe okay to cancel her f/u appt 01/07/21. Front desk if you could cancel that appt.  Dr.Jaffe pt wanted know If she could get a refill on her Cymbalta and Aimovig.

## 2021-01-04 NOTE — Telephone Encounter (Signed)
-----   Message from Pieter Partridge, DO sent at 01/04/2021 12:50 PM EDT ----- Nerve study is normal.  No evidence of any nerve damage

## 2021-01-07 ENCOUNTER — Ambulatory Visit: Payer: No Typology Code available for payment source | Admitting: Neurology

## 2021-01-07 ENCOUNTER — Other Ambulatory Visit: Payer: Self-pay | Admitting: Neurology

## 2021-01-13 ENCOUNTER — Encounter: Payer: Self-pay | Admitting: Nurse Practitioner

## 2021-01-14 ENCOUNTER — Ambulatory Visit
Admission: RE | Admit: 2021-01-14 | Discharge: 2021-01-14 | Disposition: A | Discharge status: Home / Self Care | Payer: No Typology Code available for payment source | Source: Ambulatory Visit

## 2021-01-14 ENCOUNTER — Other Ambulatory Visit: Payer: Self-pay

## 2021-01-14 DIAGNOSIS — Z1231 Encounter for screening mammogram for malignant neoplasm of breast: Secondary | ICD-10-CM

## 2021-01-14 IMAGING — MG MM DIGITAL SCREENING BILAT W/ TOMO AND CAD
8 series · 8 of 24 positions shown · non-contrast
Comparison: Previous exam(s).

CLINICAL DATA: Screening.

EXAM:
DIGITAL SCREENING BILATERAL MAMMOGRAM WITH TOMOSYNTHESIS AND CAD
TECHNIQUE: Bilateral screening digital craniocaudal and mediolateral oblique
mammograms were obtained. Bilateral screening digital breast
tomosynthesis was performed. The images were evaluated with
computer-aided detection.

[R MLO synth-2D]
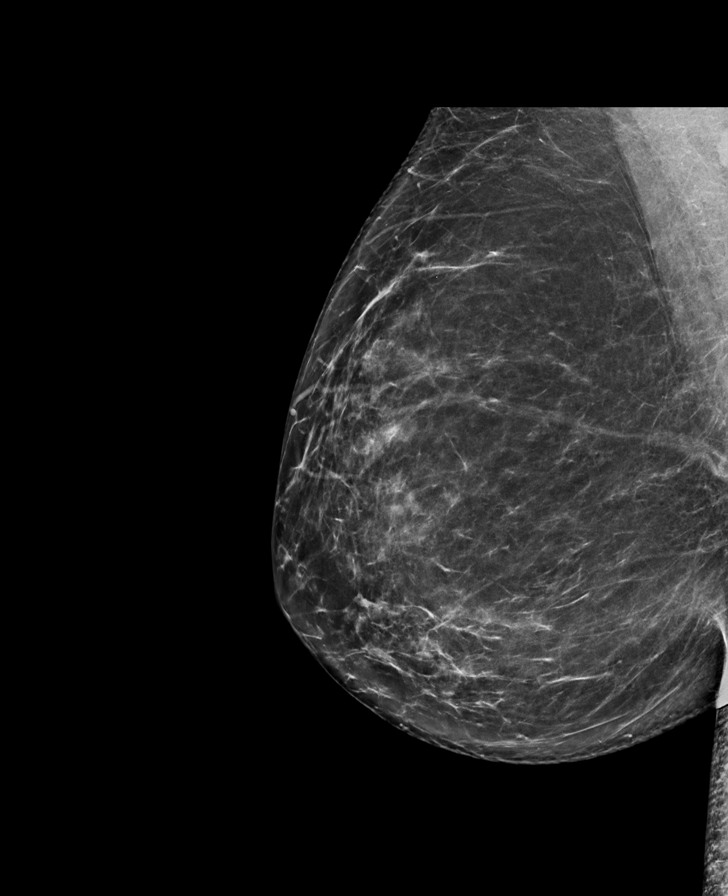

[L MLO synth-2D]
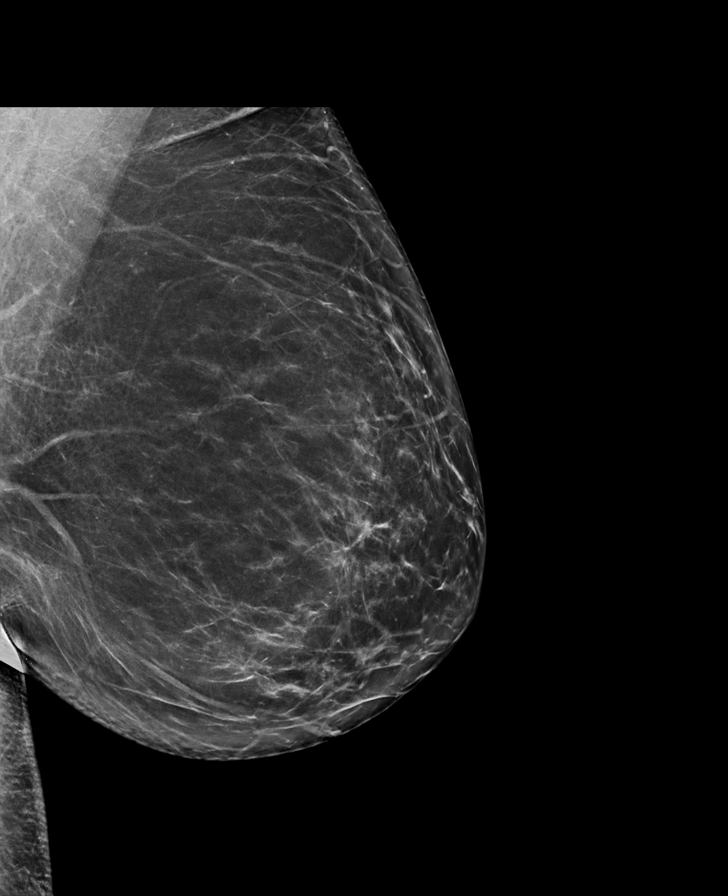

[R CC synth-2D]
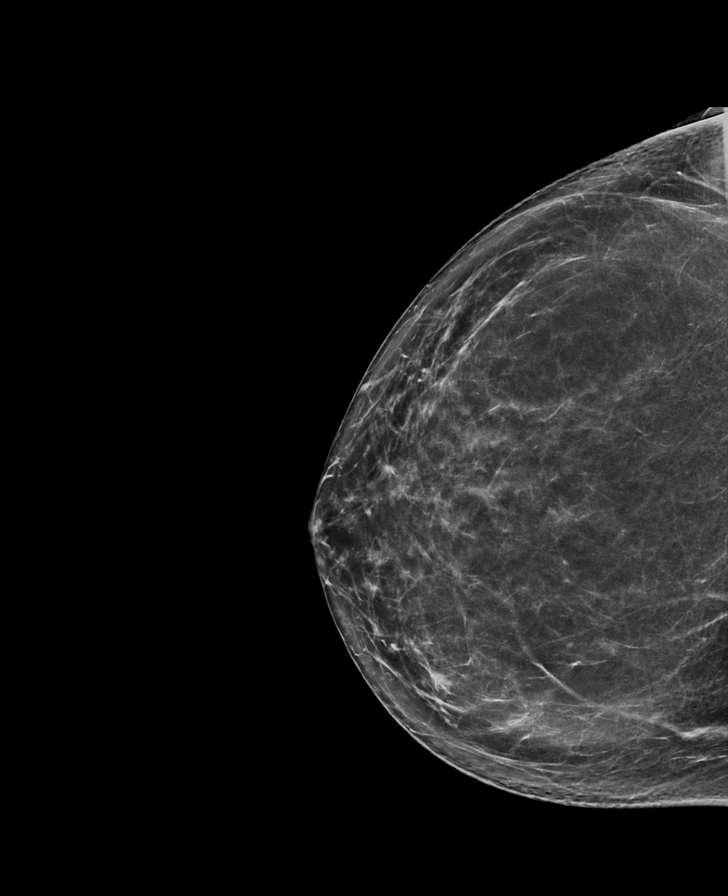

[L CC synth-2D]
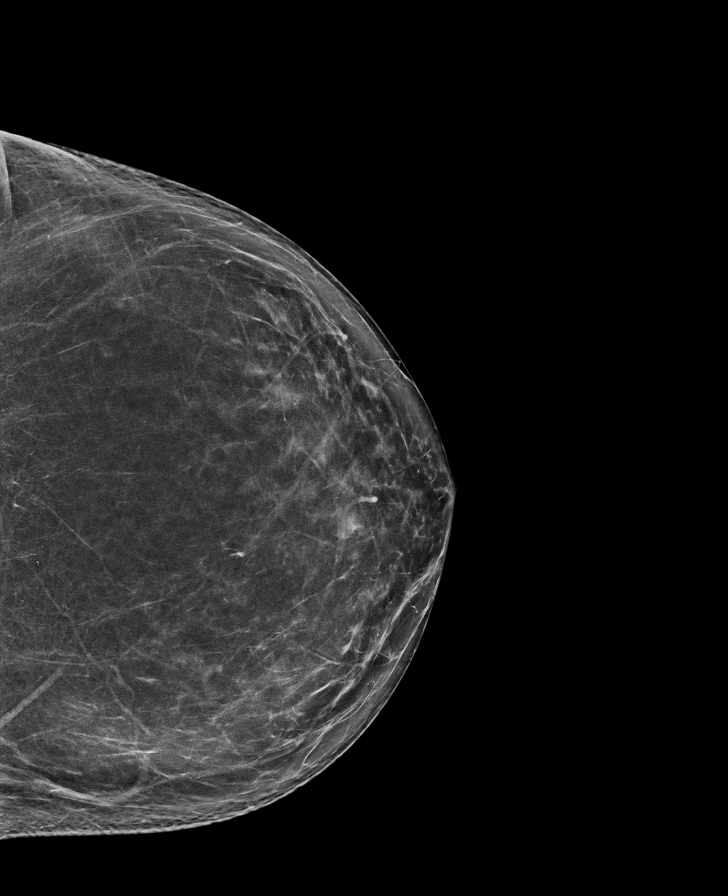

[L CC tomo · tomo slice 41/80.0]
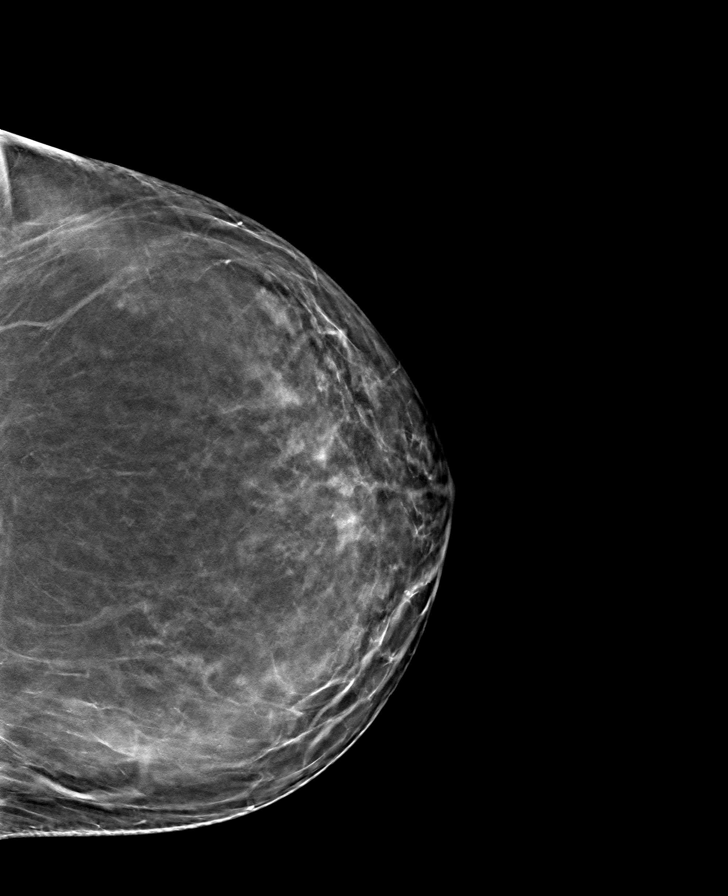

[R CC tomo · tomo slice 41/80.0]
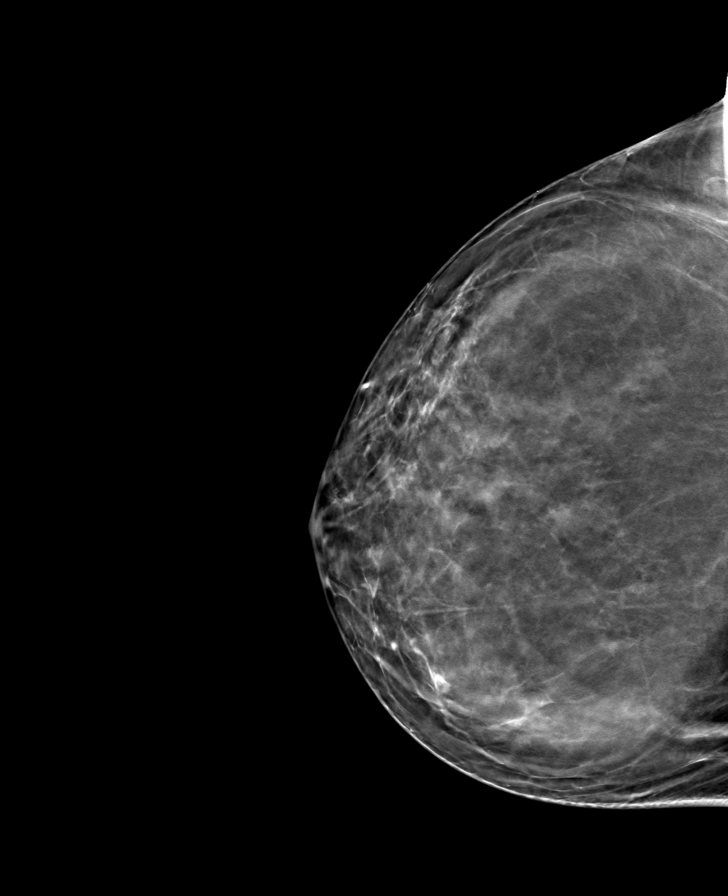

[L MLO tomo · tomo slice 43/86.0]
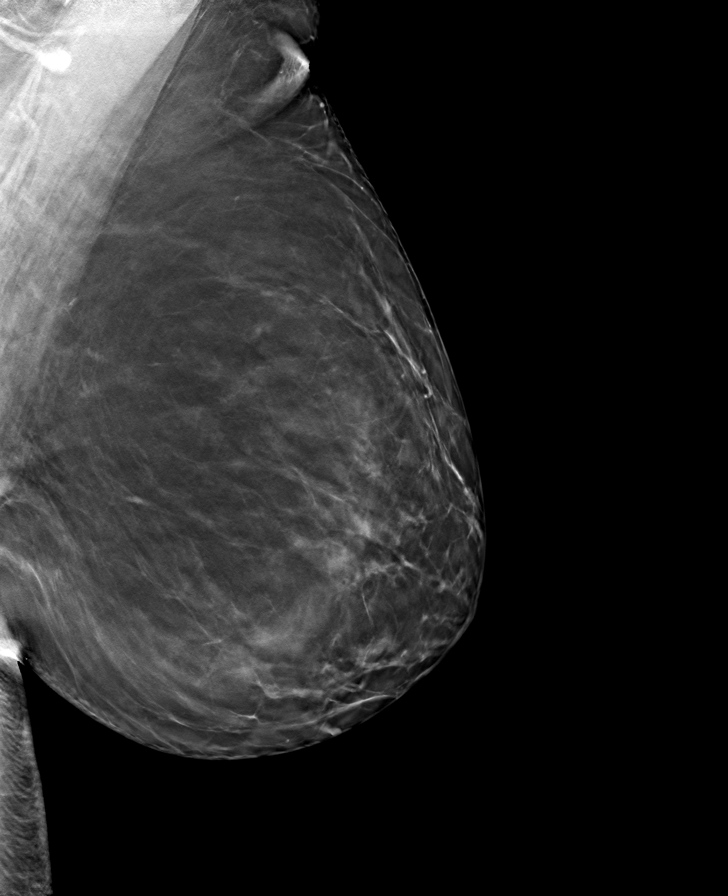

[R MLO tomo · tomo slice 43/84.0]
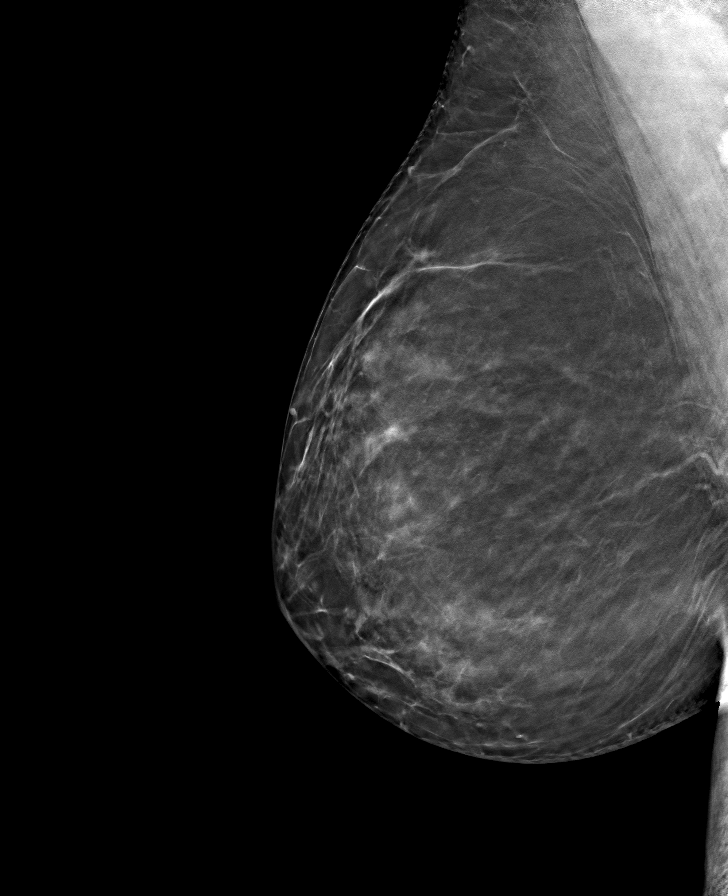

[8 of 24 positions shown; findings below may reference images not displayed]

ACR Breast Density Category b: There are scattered areas of
fibroglandular density.
FINDINGS: There are no findings suspicious for malignancy. The images were
evaluated with computer-aided detection.
IMPRESSION: No mammographic evidence of malignancy. A result letter of this
screening mammogram will be mailed directly to the patient.

RECOMMENDATION:
Screening mammogram in one year. (Code:[OD])

BI-RADS CATEGORY  1: Negative.

## 2021-01-18 ENCOUNTER — Encounter: Payer: Self-pay | Admitting: Nurse Practitioner

## 2021-01-19 ENCOUNTER — Other Ambulatory Visit: Payer: Self-pay

## 2021-01-19 ENCOUNTER — Encounter: Payer: Self-pay | Admitting: Rheumatology

## 2021-01-19 ENCOUNTER — Ambulatory Visit (INDEPENDENT_AMBULATORY_CARE_PROVIDER_SITE_OTHER): Payer: No Typology Code available for payment source | Admitting: Rheumatology

## 2021-01-19 VITALS — BP 120/82 | HR 99 | Resp 16 | Ht 66.5 in | Wt 223.6 lb

## 2021-01-19 DIAGNOSIS — Z8669 Personal history of other diseases of the nervous system and sense organs: Secondary | ICD-10-CM

## 2021-01-19 DIAGNOSIS — I7300 Raynaud's syndrome without gangrene: Secondary | ICD-10-CM | POA: Diagnosis not present

## 2021-01-19 DIAGNOSIS — I10 Essential (primary) hypertension: Secondary | ICD-10-CM

## 2021-01-19 DIAGNOSIS — F4310 Post-traumatic stress disorder, unspecified: Secondary | ICD-10-CM

## 2021-01-19 DIAGNOSIS — E119 Type 2 diabetes mellitus without complications: Secondary | ICD-10-CM

## 2021-01-19 DIAGNOSIS — M25562 Pain in left knee: Secondary | ICD-10-CM

## 2021-01-19 DIAGNOSIS — Z87891 Personal history of nicotine dependence: Secondary | ICD-10-CM

## 2021-01-19 DIAGNOSIS — F900 Attention-deficit hyperactivity disorder, predominantly inattentive type: Secondary | ICD-10-CM

## 2021-01-19 DIAGNOSIS — M7918 Myalgia, other site: Secondary | ICD-10-CM

## 2021-01-19 DIAGNOSIS — M25561 Pain in right knee: Secondary | ICD-10-CM

## 2021-01-19 DIAGNOSIS — K2100 Gastro-esophageal reflux disease with esophagitis, without bleeding: Secondary | ICD-10-CM

## 2021-01-19 DIAGNOSIS — M5030 Other cervical disc degeneration, unspecified cervical region: Secondary | ICD-10-CM | POA: Diagnosis not present

## 2021-01-19 DIAGNOSIS — M5136 Other intervertebral disc degeneration, lumbar region: Secondary | ICD-10-CM

## 2021-01-19 DIAGNOSIS — G8929 Other chronic pain: Secondary | ICD-10-CM

## 2021-01-19 DIAGNOSIS — F419 Anxiety disorder, unspecified: Secondary | ICD-10-CM

## 2021-01-19 DIAGNOSIS — R202 Paresthesia of skin: Secondary | ICD-10-CM

## 2021-01-19 DIAGNOSIS — G932 Benign intracranial hypertension: Secondary | ICD-10-CM

## 2021-01-19 DIAGNOSIS — F32A Depression, unspecified: Secondary | ICD-10-CM

## 2021-01-19 NOTE — Progress Notes (Signed)
Office Visit Note  Patient: Virginia Douglas             Date of Birth: 11/03/1975           MRN: [id number]             PCP: [name], FNP Referring: [name], FNP Visit Date: 01/19/2021 Occupation: @GUAROCC @  Subjective:  Discoloration of hands and feet.   History of Present Illness: Virginia Douglas is a 45 y.o. female with history of degenerative disc disease and myofascial pain.  She states she has been going to physical therapy on a regular basis which is helped her neck and lower back pain.  She continues to have some generalized pain and discomfort.  She states since her last visit she had an episode when her fingers and toes turn blue and extremely painful.  She had symptoms of Raynauds for the last many years.  She denies any history of digital ulcers.  Activities of Daily Living:  Patient reports morning stiffness for 15 minutes.   Patient Reports nocturnal pain.  Difficulty dressing/grooming: Denies Difficulty climbing stairs: Denies Difficulty getting out of chair: Denies Difficulty using hands for taps, buttons, cutlery, and/or writing: Reports  Review of Systems  Constitutional: Positive for fatigue and night sweats. Negative for weight gain and weight loss.  HENT: Positive for mouth dryness and nose dryness. Negative for mouth sores, trouble swallowing, trouble swallowing and sore tongue.   Eyes: Positive for dryness. Negative for pain, redness, itching and visual disturbance.  Respiratory: Negative for cough, shortness of breath and difficulty breathing.   Cardiovascular: Negative for chest pain, palpitations, hypertension, irregular heartbeat and swelling in legs/feet.  Gastrointestinal: Negative for blood in stool, constipation and diarrhea.  Endocrine: Negative for increased urination.  Genitourinary: Negative for difficulty urinating and vaginal dryness.  Musculoskeletal: Positive for arthralgias, joint pain, myalgias, morning stiffness,  muscle tenderness and myalgias. Negative for joint swelling and muscle weakness.  Skin: Positive for color change. Negative for rash, hair loss, redness, skin tightness, ulcers and sensitivity to sunlight.  Allergic/Immunologic: Positive for susceptible to infections.  Neurological: Positive for numbness, headaches, night sweats and weakness. Negative for dizziness and memory loss.  Hematological: Negative for bruising/bleeding tendency and swollen glands.  Psychiatric/Behavioral: Negative for depressed mood, confusion and sleep disturbance. The patient is not nervous/anxious.     PMFS History:  Patient Active Problem List   Diagnosis Date Noted  . DDD (degenerative disc disease), cervical 12/06/2020  . DDD (degenerative disc disease), lumbar 12/06/2020  . Gastroesophageal reflux disease with esophagitis without hemorrhage 12/06/2020  . Increased intracranial pressure 12/06/2020  . PTSD (post-traumatic stress disorder) 12/06/2020  . Attention deficit hyperactivity disorder (ADHD), predominantly inattentive type 12/06/2020  . Vitamin D deficiency 12/06/2020  . Obesity (BMI 30-39.9) 03/15/2020  . Sciatic nerve pain 12/10/2019  . Acute pain of left shoulder 06/05/2019  . Grief 06/05/2019  . Migraine 06/05/2019  . Type 2 diabetes mellitus without complication, without long-term current use of insulin ([place]) 04/07/2019  . Depression 04/07/2019  . Obesity due to excess calories without serious comorbidity 04/07/2019  . Left leg pain 04/07/2019  . Anxiety 04/07/2019  . Headache 07/06/2018  . Diabetes ([place]) 12/08/2015  . Essential hypertension 12/08/2015  . Smoker 12/08/2015  . LLQ pain 12/08/2015  . Paresthesia 09/16/2015  . Urinary urgency 09/16/2015    Past Medical History:  Diagnosis Date  . Anemia    iron deficiency  . Diabetes ([place])   . Edema   .  Migraine headache     Family History  Problem Relation Age of Onset  . Diabetes Mother   . Hypertension Mother   . Diabetes  Father   . Hypertension Father   . Kidney failure Father   . Hypertension Brother   . Diabetes Maternal Grandmother   . Pancreatic cancer Maternal Grandmother   . Diabetes Paternal Grandmother   . Breast cancer Paternal Grandmother   . Stroke Paternal Grandfather   . Cancer Maternal Aunt        breast  . Breast cancer Maternal Aunt   . Healthy Son   . Healthy Son    Past Surgical History:  Procedure Laterality Date  . ABDOMINAL HYSTERECTOMY    . CHOLECYSTECTOMY    . COLONOSCOPY  12/28/2020  . 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 She lives with her 2 children in a one story house. She drinks one large cup of coffee a day and an occasional tea or soda. She walks daily.      One story home      Immunization History  Administered Date(s) Administered  . HPV 9-valent 04/11/2019  . Influenza Inj Mdck Quad Pf 05/18/2018  . Influenza,inj,Quad PF,6+ Mos 05/20/2017, 05/14/2019, 06/02/2020  . Influenza-Unspecified 05/11/2017, 05/15/2018, 05/08/2019  . Janssen (J&J) SARS-COV-2 Vaccination 11/23/2019  . PFIZER(Purple Top)SARS-COV-2 Vaccination 07/11/2020  . Pneumococcal Polysaccharide-23 04/08/2019, 07/15/2019  . Tdap 07/02/2017, 04/09/2019     Objective: Vital Signs: BP 120/82 (BP Location: Left Arm, Patient Position: Sitting, Cuff Size: Normal)   Pulse 99   Resp 16   Ht 5' 6.5" (1.689 m)   Wt 223 lb 9.6 oz (101.4 kg)   BMI 35.55 kg/m    Physical Exam Vitals and nursing note reviewed.  Constitutional:      Appearance: She is well-developed.  HENT:     Head: Normocephalic and atraumatic.  Eyes:     Conjunctiva/sclera: Conjunctivae normal.  Cardiovascular:     Rate and Rhythm: Normal rate and regular rhythm.     Heart sounds: Normal heart  sounds.  Pulmonary:     Effort: Pulmonary effort is normal.     Breath sounds: Normal breath sounds.  Abdominal:     General: Bowel sounds are normal.     Palpations: Abdomen is soft.  Musculoskeletal:     Cervical back: Normal range of motion.  Lymphadenopathy:     Cervical: No cervical adenopathy.  Skin:    General: Skin is warm and dry.     Capillary Refill: Capillary refill takes more than 3 seconds.     Comments: No sclerodactyly was noted.  No nailbed capillary changes were noted.  No telangiectasias were noted.  Neurological:     Mental Status: She is alert and oriented to person, place, and time.  Psychiatric:        Behavior: Behavior normal.      Musculoskeletal Exam: She has painful range of motion of her cervical and lumbar spine.  Shoulder joints, elbow joints, wrist joints, MCPs PIPs and DIPs with good range of motion with no synovitis.  Hip joints, knee joints, ankles, MTPs and PIPs with good range of motion with no synovitis.  CDAI Exam: CDAI Score: -- Patient Global: --;  Provider Global: -- Swollen: --; Tender: -- Joint Exam 01/19/2021   No joint exam has been documented for this visit   There is currently no information documented on the homunculus. Go to the Rheumatology activity and complete the homunculus joint exam.  Investigation: No additional findings.  Imaging: NCV with EMG(electromyography)  Result Date: 01/04/2021 Alda Berthold, DO     01/04/2021 12:08 PM [facility] [place], [place]  [place], [place] [address] Tel: [phone] Fax:  [phone] Test Date:  01/04/2021 Patient: Duha Abair DOB: 01-Mar-1976 Physician: Narda Amber, DO Sex: Female Height: 5' 6"  Ref Phys: Metta Clines, D.O. ID#: [id number]   Technician:  Patient Complaints: This is a 45 year old female referred for evaluation of numbness in the hands and feet. NCV & EMG Findings: Extensive electrodiagnostic testing of the right upper and lower extremity shows: 1.  All sensory responses including the right median, ulnar, mixed palmar, sural, and superficial peroneal nerves are within normal limits. 2. All motor responses including the right median, ulnar, peroneal, and tibial nerves are within normal limits. 3. Right tibial H reflex study is within normal limits. 4. There is no evidence of active or chronic motor axonal loss changes affecting any of the tested muscles.  Motor unit configuration and recruitment pattern is within normal limits. Impression: This is a normal study of the right upper and lower extremities.  In particular, there is no evidence of a sensorimotor polyneuropathy, carpal tunnel syndrome, or cervical/lumbosacral radiculopathy. ___________________________ Narda Amber, DO Nerve Conduction Studies Anti Sensory Summary Table  Stim Site NR Peak (ms) Norm Peak (ms) P-T Amp (V) Norm P-T Amp Right Median Anti Sensory (2nd Digit)  32C Wrist    3.3 <3.4 34.4 >20 Right Sup Peroneal Anti Sensory (Ant Lat Mall)  32C 12 cm    1.9 <4.5 11.1 >5 Right Sural Anti Sensory (Lat Mall)  32C Calf    3.1 <4.5 23.3 >5 Right Ulnar Anti Sensory (5th Digit)  32C Wrist    2.6 <3.1 42.1 >12 Motor Summary Table  Stim Site NR Onset (ms) Norm Onset (ms) O-P Amp (mV) Norm O-P Amp Site1 Site2 Delta-0 (ms) Dist (cm) Vel (m/s) Norm Vel (m/s) Right Median Motor (Abd Poll Brev)  32C Wrist    3.5 <3.9 9.6 >6 Elbow Wrist 4.9 29.0 59 >50 Elbow    8.4  9.1        Right Peroneal Motor (Ext Dig Brev)  32C Ankle    4.5 <5.5 6.5 >3 B Fib Ankle 7.6 38.0 50 >40 B Fib    12.1  6.3  Poplt B Fib 1.2 8.0 67 >40 Poplt    13.3  6.3        Right Tibial Motor (Abd Hall Brev)  32C Ankle    4.8 <6.0 14.2 >8 Knee Ankle 8.4 44.0 52 >40 Knee    13.2  11.6        Right Ulnar Motor (Abd Dig Minimi)  32C Wrist    2.3 <3.1 9.6 >7 B Elbow Wrist 3.9 25.0 64 >50 B Elbow    6.2  9.4  A Elbow B Elbow 1.3 10.0 77 >50 A Elbow    7.5  8.7        Comparison Summary Table  Stim Site NR Peak (ms) Norm Peak (ms) P-T Amp  (V) Site1 Site2 Delta-P (ms) Norm Delta (ms) Right Median/Ulnar Palm Comparison (Wrist - 8cm)  32C Median Palm    1.9 <2.2 44.2 Median Palm  Ulnar Palm 0.3  Ulnar Palm    1.6 <2.2 12.9     H Reflex Studies  NR H-Lat (ms) Lat Norm (ms) L-R H-Lat (ms) Right Tibial (Gastroc)  32C    31.16 <35  EMG  Side Muscle Ins Act Fibs Psw Fasc Number Recrt Dur Dur. Amp Amp. Poly Poly. Comment Right AntTibialis Nml Nml Nml Nml Nml Nml Nml Nml Nml Nml Nml Nml N/A Right Gastroc Nml Nml Nml Nml Nml Nml Nml Nml Nml Nml Nml Nml N/A Right Flex Dig Long Nml Nml Nml Nml Nml Nml Nml Nml Nml Nml Nml Nml N/A Right RectFemoris Nml Nml Nml Nml Nml Nml Nml Nml Nml Nml Nml Nml N/A Right GluteusMed Nml Nml Nml Nml Nml Nml Nml Nml Nml Nml Nml Nml N/A Right 1stDorInt Nml Nml Nml Nml Nml Nml Nml Nml Nml Nml Nml Nml N/A Right PronatorTeres Nml Nml Nml Nml Nml Nml Nml Nml Nml Nml Nml Nml N/A Right Biceps Nml Nml Nml Nml Nml Nml Nml Nml Nml Nml Nml Nml N/A Right Triceps Nml Nml Nml Nml Nml Nml Nml Nml Nml Nml Nml Nml N/A Right Deltoid Nml Nml Nml Nml Nml Nml Nml Nml Nml Nml Nml Nml N/A Waveforms:             MM 3D SCREEN BREAST BILATERAL  Result Date: 01/14/2021 CLINICAL DATA:  Screening. EXAM: DIGITAL SCREENING BILATERAL MAMMOGRAM WITH TOMOSYNTHESIS AND CAD TECHNIQUE: Bilateral screening digital craniocaudal and mediolateral oblique mammograms were obtained. Bilateral screening digital breast tomosynthesis was performed. The images were evaluated with computer-aided detection. COMPARISON:  Previous exam(s). ACR Breast Density Category b: There are scattered areas of fibroglandular density. FINDINGS: There are no findings suspicious for malignancy. The images were evaluated with computer-aided detection. IMPRESSION: No mammographic evidence of malignancy. A result letter of this screening mammogram will be mailed directly to the patient. RECOMMENDATION: Screening mammogram in one year. (Code:SM-B-01Y) BI-RADS CATEGORY  1: Negative. Electronically  Signed   By: Ammie Ferrier M.D.   On: 01/14/2021 10:47    Recent Labs: Lab Results  Component Value Date   WBC 9.6 10/12/2020   HGB 13.8 10/12/2020   PLT 336 10/12/2020   NA 137 10/12/2020   K 3.3 (L) 10/12/2020   CL 104 10/12/2020   CO2 19 (L) 10/12/2020   GLUCOSE 106 (H) 10/12/2020   BUN 8 10/12/2020   CREATININE 0.65 10/12/2020   BILITOT 0.4 10/12/2020   ALKPHOS 107 10/12/2020   AST 21 10/12/2020   ALT 18 10/12/2020   PROT 6.5 12/06/2020   ALBUMIN 4.4 10/12/2020   CALCIUM 9.8 10/12/2020   GFRAA 89 07/15/2020    10/05/20: ESR 13, RF<10, CRP 10, dsDNA<1, C3 187, ANA negative 11/05/2020 CK117, TSH normal, SPEP normal  Speciality Comments: No specialty comments available.  Procedures:  No procedures performed Allergies: Patient has no known allergies.   Assessment / Plan:     Visit Diagnoses: Raynaud's disease without gangrene -patient's with history of discoloration of her hands and feet for many years.  She states the symptoms has progressively getting worse.  She had an episode recently when it she has severe pain and discomfort in her hands and feet after being cold.  She denies any history of digital ulcers.  She has decreased capillary refill.  No digital ulcers were noted.  She has no nailbed capillary changes or sclerodactyly.  ANA was negative in the past.  I will obtain additional labs today.  Detailed counseling guarding raynaud's phenominon was  provided.  Keeping core temperature warm and keeping her extremities warm was discussed.  Most likely explanation of her Raynaud's phenomenon is history of smoking for many years.  Plan: Cryoglobulin, Beta-2 glycoprotein antibodies, Cardiolipin antibodies, IgG, IgM, IgA, Lupus Anticoagulant Eval w/Reflex, Anti-scleroderma antibody, Pan-ANCA  Myofascial pain-she continues to have some generalized pain and discomfort.  She has positive tender points and hyperalgesia.  She is on Cymbalta now.  She is also on Topamax which should  help her pain symptoms.  DDD (degenerative disc disease), cervical - X-rays from November 11, 2020 showed mild degenerative disc disease.  She has noted improvement with physical therapy.  DDD (degenerative disc disease), lumbar - L5-S1 bilateral foraminal stenosis was noted on her MRI May 09, 2020.  Levoscoliosis was noted.  Physical therapy is helping her symptoms.  Chronic pain of both knees - X-rays of bilateral knee joints were unremarkable.  She states the knee joint discomfort has improved.  Essential hypertension  Other medical problems are listed as follows:  Type 2 diabetes mellitus without complication, without long-term current use of insulin ([facility])  Gastroesophageal reflux disease with esophagitis without hemorrhage  Increased intracranial pressure  Hx of migraine headaches  Paresthesia  Anxiety and depression  PTSD (post-traumatic stress disorder)  Attention deficit hyperactivity disorder (ADHD), predominantly inattentive type  Former smoker - 1/2 PPDx 25 years, quit 2021  Orders: Orders Placed This Encounter  Procedures  . Cryoglobulin  . Beta-2 glycoprotein antibodies  . Cardiolipin antibodies, IgG, IgM, IgA  . Lupus Anticoagulant Eval w/Reflex  . Anti-scleroderma antibody  . Pan-ANCA   No orders of the defined types were placed in this encounter.     Follow-Up Instructions: Return in about 2 months (around 03/21/2021) for Raynauds.   Bo Merino, MD  Note - This record has been created using Editor, commissioning.  Chart creation errors have been sought, but may not always  have been located. Such creation errors do not reflect on  the standard of medical care.

## 2021-01-19 NOTE — Patient Instructions (Signed)
 Raynaud Phenomenon  Raynaud phenomenon is a condition that affects the blood vessels (arteries) that carry blood to your fingers and toes. The arteries that supply blood to your ears, lips, nipples, or the tip of your nose might also be affected. Raynaud phenomenon causes the arteries to become narrow temporarily (spasm). As a result, the flow of blood to the affected areas is temporarily decreased. This usually occurs in response to cold temperatures or stress. During an attack, the skin in the affected areas turns white, then blue, and finally red. You may also feel tingling or numbness in those areas. Attacks usually last for only a brief period, and then the blood flow to the area returns to normal. In most cases, Raynaud phenomenon does not cause serious health problems. What are the causes? In many cases, the cause of this condition is not known. The condition may occur on its own (primary Raynaud phenomenon) or may be associated with other diseases or factors (secondary Raynaud phenomenon). Possible causes may include:  Diseases or medical conditions that damage the arteries.  Injuries and repetitive actions that hurt the hands or feet.  Being exposed to certain chemicals.  Taking medicines that narrow the arteries.  Other medical conditions, such as lupus, scleroderma, rheumatoid arthritis, thyroid problems, blood disorders, Sjogren syndrome, or atherosclerosis. What increases the risk? The following factors may make you more likely to develop this condition:  Being 20-40 years old.  Being female.  Having a family history of Raynaud phenomenon.  Living in a cold climate.  Smoking. What are the signs or symptoms? Symptoms of this condition usually occur when you are exposed to cold temperatures or when you have emotional stress. The symptoms may last for a few minutes or up to several hours. They usually affect your fingers but may also affect your toes, nipples, lips, ears,  or the tip of your nose. Symptoms may include:  Changes in skin color. The skin in the affected areas will turn pale or white. The skin may then change from white to bluish to red as normal blood flow returns to the area.  Numbness, tingling, or pain in the affected areas. In severe cases, symptoms may include:  Skin sores.  Tissues decaying and dying (gangrene). How is this diagnosed? This condition may be diagnosed based on:  Your symptoms and medical history.  A physical exam. During the exam, you may be asked to put your hands in cold water to check for a reaction to cold temperature.  Tests, such as: ? Blood tests to check for other diseases or conditions. ? A test to check the movement of blood through your arteries and veins (vascular ultrasound). ? A test in which the skin at the base of your fingernail is examined under a microscope (nailfold capillaroscopy). How is this treated? Treatment for this condition often involves making lifestyle changes and taking steps to control your exposure to cold temperatures. For more severe cases, medicine (calcium channel blockers) may be used to improve blood flow. Surgery is sometimes done to block the nerves that control the affected arteries, but this is rare. Follow these instructions at home: Avoiding cold temperatures Take these steps to avoid exposure to cold:  If possible, stay indoors during cold weather.  When you go outside during cold weather, dress in layers and wear mittens, a hat, a scarf, and warm footwear.  Wear mittens or gloves when handling ice or frozen food.  Use holders for glasses or cans containing cold drinks.    Let warm water run for a while before taking a shower or bath.  Warm up the car before driving in cold weather. Lifestyle  If possible, avoid stressful and emotional situations. Try to find ways to manage your stress, such as: ? Exercise. ? Yoga. ? Meditation. ? Biofeedback.  Do not use any  products that contain nicotine or tobacco, such as cigarettes and e-cigarettes. If you need help quitting, ask your health care provider.  Avoid secondhand smoke.  Limit your use of caffeine. ? Switch to decaffeinated coffee, tea, and soda. ? Avoid chocolate.  Avoid vibrating tools and machinery. General instructions  Protect your hands and feet from injuries, cuts, or bruises.  Avoid wearing tight rings or wristbands.  Wear loose fitting socks and comfortable, roomy shoes.  Take over-the-counter and prescription medicines only as told by your health care provider. Contact a health care provider if:  Your discomfort becomes worse despite lifestyle changes.  You develop sores on your fingers or toes that do not heal.  Your fingers or toes turn black.  You have breaks in the skin on your fingers or toes.  You have a fever.  You have pain or swelling in your joints.  You have a rash.  Your symptoms occur on only one side of your body. Summary  Raynaud phenomenon is a condition that affects the arteries that carry blood to your fingers, toes, ears, lips, nipples, or the tip of your nose.  In many cases, the cause of this condition is not known.  Symptoms of this condition include changes in skin color, and numbness and tingling of the affected area.  Treatment for this condition includes lifestyle changes, reducing exposure to cold temperatures, and using medicines for severe cases of the condition.  Contact your health care provider if your condition worsens despite treatment. This information is not intended to replace advice given to you by your health care provider. Make sure you discuss any questions you have with your health care provider. Document Revised: 01/01/2020 Document Reviewed: 01/01/2020 Elsevier Patient Education  2021 [employer].  

## 2021-01-20 ENCOUNTER — Other Ambulatory Visit: Payer: Self-pay | Admitting: Nurse Practitioner

## 2021-01-20 DIAGNOSIS — Z1231 Encounter for screening mammogram for malignant neoplasm of breast: Secondary | ICD-10-CM

## 2021-01-26 LAB — CARDIOLIPIN ANTIBODIES, IGG, IGM, IGA
Anticardiolipin IgA: <2 APL-U/mL
Anticardiolipin IgG: <2 GPL-U/mL
Anticardiolipin IgM: 2.2 MPL-U/mL

## 2021-01-26 LAB — LUPUS ANTICOAGULANT EVAL W/ REFLEX
PTT-LA Screen: 41 s — ABNORMAL HIGH (ref <=40)
dRVVT: 34 s (ref <=45)

## 2021-01-26 LAB — PAN-ANCA
ANCA Screen: NEGATIVE
Myeloperoxidase Abs: <1 AI
Serine Protease 3: <1 AI

## 2021-01-26 LAB — ANTI-SCLERODERMA ANTIBODY: Scleroderma (Scl-70) (ENA) Antibody, IgG: <1 AI

## 2021-01-26 LAB — RFLX HEXAGONAL PHASE CONFIRM: Hexagonal Phase Conf: NEGATIVE

## 2021-01-26 LAB — BETA-2 GLYCOPROTEIN ANTIBODIES
Beta-2 Glyco 1 IgA: <2 U/mL
Beta-2 Glyco 1 IgM: 2.6 U/mL
Beta-2 Glyco I IgG: <2 U/mL

## 2021-01-26 LAB — CRYOGLOBULIN: Cryoglobulin, Qualitative Analysis: NOT DETECTED

## 2021-01-26 NOTE — Progress Notes (Signed)
Please notify patient that all autoimmune labs obtained were negative.

## 2021-01-28 ENCOUNTER — Telehealth: Payer: Self-pay | Admitting: Neurology

## 2021-01-28 ENCOUNTER — Other Ambulatory Visit: Payer: Self-pay | Admitting: Neurology

## 2021-01-28 MED ORDER — AJOVY 225 MG/1.5ML ~~LOC~~ SOAJ: 225.0000 mg | SUBCUTANEOUS | 5 refills | Status: DC

## 2021-01-28 NOTE — Telephone Encounter (Signed)
It appears that her insurance will cover Ajovy or Emgality.  Please let patient know that I sent prescription for Ajovy to the [facility] in [place].

## 2021-01-28 NOTE — Telephone Encounter (Signed)
Pt advised of Dr.Jaffe note.

## 2021-02-01 ENCOUNTER — Telehealth: Payer: Self-pay

## 2021-02-01 NOTE — Telephone Encounter (Signed)
I left pt vm to let her know her forms have been completed and she can come pick them up. The forms will be placed up front YL,RMA

## 2021-02-02 ENCOUNTER — Other Ambulatory Visit: Payer: Self-pay | Admitting: Neurology

## 2021-02-03 ENCOUNTER — Ambulatory Visit: Payer: No Typology Code available for payment source | Admitting: Rheumatology

## 2021-02-28 ENCOUNTER — Other Ambulatory Visit: Payer: Self-pay

## 2021-02-28 ENCOUNTER — Encounter: Payer: Self-pay | Admitting: Nurse Practitioner

## 2021-02-28 ENCOUNTER — Ambulatory Visit (INDEPENDENT_AMBULATORY_CARE_PROVIDER_SITE_OTHER): Payer: No Typology Code available for payment source | Admitting: Nurse Practitioner

## 2021-02-28 VITALS — BP 132/84 | Temp 98.7°F | Ht 66.0 in | Wt 234.6 lb

## 2021-02-28 DIAGNOSIS — E669 Obesity, unspecified: Secondary | ICD-10-CM | POA: Diagnosis not present

## 2021-02-28 DIAGNOSIS — R21 Rash and other nonspecific skin eruption: Secondary | ICD-10-CM

## 2021-02-28 DIAGNOSIS — E1169 Type 2 diabetes mellitus with other specified complication: Secondary | ICD-10-CM | POA: Diagnosis not present

## 2021-02-28 DIAGNOSIS — F32A Depression, unspecified: Secondary | ICD-10-CM | POA: Diagnosis not present

## 2021-02-28 DIAGNOSIS — E119 Type 2 diabetes mellitus without complications: Secondary | ICD-10-CM

## 2021-02-28 DIAGNOSIS — Z23 Encounter for immunization: Secondary | ICD-10-CM

## 2021-02-28 MED ORDER — PHENTERMINE HCL 15 MG PO CAPS
15.0000 mg | ORAL_CAPSULE | ORAL | 1 refills | Status: DC
Start: 2021-02-28 — End: 2021-04-21

## 2021-02-28 MED ORDER — OZEMPIC (2 MG/DOSE) 8 MG/3ML ~~LOC~~ SOPN: 2.0000 mg | PEN_INJECTOR | SUBCUTANEOUS | 1 refills | Status: DC

## 2021-02-28 NOTE — Progress Notes (Signed)
I,Virginia Douglas as a Education administrator for [employer], FNP.,have documented all relevant documentation on the behalf of Virginia Brine, FNP,as directed by  Virginia Brine, FNP while in the presence of Virginia Douglas, [place].  This visit occurred during the SARS-CoV-2 public health emergency.  Safety protocols were in place, including screening questions prior to the visit, additional usage of staff PPE, and extensive cleaning of exam room while observing appropriate contact time as indicated for disinfecting solutions.    Subjective:     Patient ID: Virginia Douglas , female    DOB: June 19, 1976 , 45 y.o.   MRN: [id number]   Chief Complaint  Patient presents with   Depression   Diabetes    HPI  Patient presents today for a f/u on her depression and diabetes (her blood sugar was up to 300's one morning and she had increase in urination).    Wt Readings from Last 3 Encounters: 02/28/21 : 234 lb 9.6 oz (106.4 kg) 01/19/21 : 223 lb 9.6 oz (101.4 kg) 12/28/20 : 221 lb 12.8 oz (100.6 kg)    Depression        This is a recurrent problem.  The current episode started 1 to 4 weeks ago.   The onset quality is gradual.   Associated symptoms include no fatigue.     Exacerbated by: continues to have grief related to the loss of multiple family members.   Past treatments include SSRIs - Selective serotonin reuptake inhibitors.  Compliance with treatment is good.   Past Medical History:  Diagnosis Date   Anemia    iron deficiency   Diabetes ([place])    Edema    Migraine headache      Family History  Problem Relation Age of Onset   Diabetes Mother    Hypertension Mother    Diabetes Father    Hypertension Father    Kidney failure Father    Hypertension Brother    Diabetes Maternal Grandmother    Pancreatic cancer Maternal Grandmother    Diabetes Paternal Grandmother    Breast cancer Paternal Grandmother    Stroke Paternal Grandfather    Cancer Maternal Aunt        breast   Breast  cancer Maternal Aunt    Healthy Son    Healthy Son      Current Outpatient Medications:    acetaZOLAMIDE (DIAMOX) 250 MG tablet, TAKE 1 TABLET BY MOUTH TWICE A DAY, Disp: 60 tablet, Rfl: 0   ADDERALL XR 30 MG 24 hr capsule, Take 30 mg by mouth daily., Disp: , Rfl:    atorvastatin (LIPITOR) 10 MG tablet, TAKE 1 TABLET BY MOUTH EVERY DAY, Disp: 90 tablet, Rfl: 1   Blood Glucose Monitoring Suppl (ONETOUCH VERIO IQ SYSTEM) w/Device KIT, by Does not apply route. Use to check blood sugars twice, Disp: , Rfl:    buPROPion (WELLBUTRIN XL) 150 MG 24 hr tablet, TAKE 1 TABLET BY MOUTH EVERY DAY IN THE MORNING, Disp: 90 tablet, Rfl: 1   clonazePAM (KLONOPIN) 0.5 MG tablet, TAKE 1 TABLET BY MOUTH THREE TIMES A DAY AS NEEDED FOR ANXIETY, Disp: 30 tablet, Rfl: 1   DULoxetine (CYMBALTA) 60 MG capsule, TAKE 1 CAPSULE BY MOUTH EVERY DAY, Disp: 90 capsule, Rfl: 0   eletriptan (RELPAX) 40 MG tablet, TAKE 1 TABLET AS NEEDED FOR MIGRAINE/ HEADACHE. MAY REPEAT IN 2 HRS IF HEADACHE PERSISTS OR RECURS., Disp: 10 tablet, Rfl: 1   famotidine (PEPCID) 20 MG tablet, Take 1 tablet (20 mg total)  by mouth 2 (two) times daily., Disp: 10 tablet, Rfl: 0   Fremanezumab-vfrm (AJOVY) 225 MG/1.5ML SOAJ, Inject 225 mg into the skin every 28 (twenty-eight) days., Disp: 1.68 mL, Rfl: 5   glucose blood test strip, 1 each by Other route as needed for other. Insert 1 by subcutaneous route 2 times every day check blood sugar before breakfast and dinner, Disp: , Rfl:    HYDROcodone-acetaminophen (NORCO) 10-325 MG tablet, Take 1 tablet by mouth as needed., Disp: , Rfl:    hydrOXYzine (VISTARIL) 50 MG capsule, TAKE 1 CAPSULE BY MOUTH AT BEDTIME AS NEEDED., Disp: 90 capsule, Rfl: 1   Insulin Pen Needle (PEN NEEDLES) 32G X 4 MM MISC, 1 each by Does not apply route once a week., Disp: 30 each, Rfl: 3   Lasmiditan Succinate (REYVOW) 100 MG TABS, Take 1 tablet by mouth daily as needed., Disp: 16 tablet, Rfl: 5   lisinopril-hydrochlorothiazide  (ZESTORETIC) 10-12.5 MG tablet, TAKE 1 TABLET BY MOUTH EVERY DAY, Disp: 90 tablet, Rfl: 1   Loteprednol-Tobramycin (ZYLET OP), Apply to eye., Disp: , Rfl:    phentermine 15 MG capsule, Take 1 capsule (15 mg total) by mouth every morning., Disp: 30 capsule, Rfl: 1   Semaglutide, 2 MG/DOSE, (OZEMPIC, 2 MG/DOSE,) 8 MG/3ML SOPN, Inject 2 mg into the skin once a week., Disp: 9 mL, Rfl: 1   topiramate (TOPAMAX) 50 MG tablet, TAKE 2 AND 1/2 TABLETS (125 MG TOTAL) BY MOUTH AT BEDTIME., Disp: 225 tablet, Rfl: 0   TURMERIC PO, Take 1 capsule by mouth daily., Disp: , Rfl:    Varenicline Tartrate 0.03 MG/ACT SOLN, Place into the nose., Disp: , Rfl:    Vitamin D, Ergocalciferol, (DRISDOL) 1.25 MG (50000 UNIT) CAPS capsule, TAKE 1 CAPSULE (50,000 UNITS TOTAL) BY MOUTH EVERY 7 (SEVEN) DAYS, Disp: 12 capsule, Rfl: 0   No Known Allergies   Review of Systems  Constitutional: Negative.  Negative for fatigue.  Respiratory: Negative.    Cardiovascular: Negative.  Negative for chest pain, palpitations and leg swelling.  Gastrointestinal: Negative.   Neurological: Negative.   Psychiatric/Behavioral:  Positive for depression.     Today's Vitals   02/28/21 0922  BP: 132/84  Temp: 98.7 F (37.1 C)  Weight: 234 lb 9.6 oz (106.4 kg)  Height: 5' 6"  (1.676 m)  PainSc: 0-No pain   Body mass index is 37.87 kg/m.   Objective:  Physical Exam Vitals reviewed.  Constitutional:      General: She is not in acute distress.    Appearance: Normal appearance. She is well-developed. She is obese.  HENT:     Head: Normocephalic and atraumatic.  Eyes:     Pupils: Pupils are equal, round, and reactive to light.  Cardiovascular:     Rate and Rhythm: Normal rate and regular rhythm.     Pulses: Normal pulses.     Heart sounds: Normal heart sounds. No murmur heard. Pulmonary:     Effort: Pulmonary effort is normal. No respiratory distress.     Breath sounds: Normal breath sounds. No wheezing.  Musculoskeletal:         General: No tenderness. Normal range of motion.  Skin:    General: Skin is warm and dry.     Capillary Refill: Capillary refill takes less than 2 seconds.  Neurological:     General: No focal deficit present.     Mental Status: She is alert and oriented to person, place, and time.     Cranial Nerves: No cranial  nerve deficit.  Psychiatric:        Mood and Affect: Mood normal.        Behavior: Behavior normal.        Thought Content: Thought content normal.        Judgment: Judgment normal.        Assessment And Plan:     1. Depression, unspecified depression type Chronic, she feels the wellbutrin has been effective.   2. Type 2 diabetes mellitus without complication, without long-term current use of insulin ([place]) She has had an elevated blood sugar since her last visit will check her HgbA1c I will also increase her Ozempic to 2 mg weekly - Hemoglobin A1c - Semaglutide, 2 MG/DOSE, (OZEMPIC, 2 MG/DOSE,) 8 MG/3ML SOPN; Inject 2 mg into the skin once a week.  Dispense: 9 mL; Refill: 1  3. Obesity (BMI 30-39.9) I will start her on a 1 month round of phentermine with one refill, I have explained to her in detail the importance of exercising regularly at least 150 minutes a week and eating a healthy diet to avoid rebound weight gain.  I have also given her the phone number to [facility] to sign up for the seminar and follow up on her referral I sent in April - phentermine 15 MG capsule; Take 1 capsule (15 mg total) by mouth every morning.  Dispense: 30 capsule; Refill: 1  4. Rash and nonspecific skin eruption She has fine, raised papules to posterior arms  She is requesting a referral to Dermatology - Ambulatory referral to Dermatology  5. Encounter for immunization Rx sent to pharmacy - Pneumococcal conjugate vaccine 20-valent (Prevnar 20)     Patient was given opportunity to ask questions. Patient verbalized understanding of the plan and was able to repeat key  elements of the plan. All questions were answered to their satisfaction.  Virginia Brine, FNP   I, Virginia Brine, FNP, have reviewed all documentation for this visit. The documentation on 02/28/21 for the exam, diagnosis, procedures, and orders are all accurate and complete.   IF YOU HAVE BEEN REFERRED TO A SPECIALIST, IT MAY TAKE 1-2 WEEKS TO SCHEDULE/PROCESS THE REFERRAL. IF YOU HAVE NOT HEARD FROM US/SPECIALIST IN TWO WEEKS, PLEASE GIVE [place] A CALL AT [phone].   THE PATIENT IS ENCOURAGED TO PRACTICE SOCIAL DISTANCING DUE TO THE COVID-19 PANDEMIC.

## 2021-02-28 NOTE — Patient Instructions (Addendum)
Long discussion on fluctuating weight and to have a goal to increase physical activity I encourage you to incorporate strength training at least 2 days a week.

## 2021-03-01 LAB — HEMOGLOBIN A1C
Est. average glucose Bld gHb Est-mCnc: 148 mg/dL
Hgb A1c MFr Bld: 6.8 % — ABNORMAL HIGH (ref 4.8–5.6)

## 2021-03-09 NOTE — Progress Notes (Signed)
Office Visit Note  Patient: Virginia Douglas             Date of Birth: [birth year]           MRN: [id number]             PCP: [name], FNP Referring: [name], FNP Visit Date: 03/23/2021 Occupation: @GUAROCC @  Subjective:  Raynaud's   History of Present Illness: Virginia Douglas is a 45 y.o. female with history of Raynaud's disease, myofascial pain, and DDD.  Patient presents today to discuss lab work obtained at her last office visit.  She states she continues to experience symptoms of Raynaud's several days a week.  She denies any digital ulcerations or signs of gangrene.  She has not noticed any increased skin tightness or thickening.  She has not had any recent rashes.  Patient reports that she continues to follow-up with pain management and has been getting lower back injections.  She states that recently she has been experiencing increased neck pain and at times it is difficult for her to hold her head up.  She states that she tried physical therapy but did not notice much improvement in her symptoms.  She states that Dr. Vira Blanco ordered an MRI which was performed on 03/20/2021.  She has an upcoming appointment scheduled to review results and the next treatment options.  She continues to experience migraines on a weekly basis which typically last several days at a time.  She remains on ajoy injections as prescribed. She states about 2.5 weeks ago she woke up with some numbness on the right side of her face.  She was not evaluated emergently at that time.  She denies any pain or rashes on the right side of her face during that time.  She did not experience any vision changes.  She has not had any recurrence of symptoms. She plans on calling her neurologist to schedule a sooner office visit for further evaluation.  She continues to experience myalgias and muscle tenderness due to underlying myofascial pain.  She is also had some increased brain fog recently.   Activities  of Daily Living:  Patient reports morning stiffness for 30 minutes.   Patient Reports nocturnal pain.  Difficulty dressing/grooming: Reports Difficulty climbing stairs: Reports Difficulty getting out of chair: Reports Difficulty using hands for taps, buttons, cutlery, and/or writing: Reports  Review of Systems  Constitutional:  Positive for fatigue.  HENT:  Positive for mouth dryness and nose dryness. Negative for mouth sores.   Eyes:  Positive for pain, itching and dryness. Negative for visual disturbance.  Respiratory:  Negative for cough, hemoptysis, shortness of breath and difficulty breathing.   Cardiovascular:  Positive for palpitations. Negative for hypertension and swelling in legs/feet.  Gastrointestinal:  Negative for blood in stool, constipation and diarrhea.  Endocrine: Negative for increased urination.  Genitourinary:  Negative for difficulty urinating and painful urination.  Musculoskeletal:  Positive for joint pain, joint pain, joint swelling, myalgias, morning stiffness, muscle tenderness and myalgias. Negative for muscle weakness.  Skin:  Positive for color change. Negative for pallor, rash, hair loss, nodules/bumps, redness, skin tightness, ulcers and sensitivity to sunlight.  Neurological:  Positive for headaches.  Hematological:  Negative for swollen glands.  Psychiatric/Behavioral:  Positive for confusion (Brain fog). Negative for depressed mood and sleep disturbance. The patient is not nervous/anxious.    PMFS History:  Patient Active Problem List   Diagnosis Date Noted   DDD (degenerative disc disease), cervical 12/06/2020  DDD (degenerative disc disease), lumbar 12/06/2020   Gastroesophageal reflux disease with esophagitis without hemorrhage 12/06/2020   Increased intracranial pressure 12/06/2020   PTSD (post-traumatic stress disorder) 12/06/2020   Attention deficit hyperactivity disorder (ADHD), predominantly inattentive type 12/06/2020   Vitamin D deficiency  12/06/2020   Obesity (BMI 30-39.9) 03/15/2020   Sciatic nerve pain 12/10/2019   Acute pain of left shoulder 06/05/2019   Grief 06/05/2019   Migraine 06/05/2019   Type 2 diabetes mellitus without complication, without long-term current use of insulin ([place]) 04/07/2019   Depression 04/07/2019   Obesity due to excess calories without serious comorbidity 04/07/2019   Left leg pain 04/07/2019   Anxiety 04/07/2019   Headache 07/06/2018   Diabetes ([place]) 12/08/2015   Essential hypertension 12/08/2015   Smoker 12/08/2015   LLQ pain 12/08/2015   Paresthesia 09/16/2015   Urinary urgency 09/16/2015    Past Medical History:  Diagnosis Date   Anemia    iron deficiency   Diabetes ([place])    Edema    Migraine headache     Family History  Problem Relation Age of Onset   Diabetes Mother    Hypertension Mother    Diabetes Father    Hypertension Father    Kidney failure Father    Hypertension Brother    Diabetes Maternal Grandmother    Pancreatic cancer Maternal Grandmother    Diabetes Paternal Grandmother    Breast cancer Paternal Grandmother    Stroke Paternal Grandfather    Cancer Maternal Aunt        breast   Breast cancer Maternal Aunt    Healthy Son    Healthy Son    Past Surgical History:  Procedure Laterality Date   ABDOMINAL HYSTERECTOMY     CHOLECYSTECTOMY     COLONOSCOPY  12/28/2020   ESOPHAGEAL DILATION     OVARIAN CYST REMOVAL     Social History   Social History Narrative   In relationship, 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 She lives with her 2 children in a one story house. She drinks one large cup of coffee a day and an occasional tea or soda. She walks daily.      One story home      Immunization History  Administered Date(s) Administered   HPV 9-valent 04/11/2019   Influenza Inj Mdck Quad Pf 05/18/2018    Influenza,inj,Quad PF,6+ Mos 05/20/2017, 05/14/2019, 06/02/2020   Influenza-Unspecified 05/11/2017, 05/15/2018, 05/08/2019   Janssen (J&J) SARS-COV-2 Vaccination 11/23/2019   PFIZER(Purple Top)SARS-COV-2 Vaccination 07/11/2020   Pneumococcal Polysaccharide-23 04/08/2019, 07/15/2019   Tdap 07/02/2017, 04/09/2019     Objective: Vital Signs: BP 116/76 (BP Location: Left Arm, Patient Position: Sitting, Cuff Size: Large)   Pulse 96   Resp 16   Ht 5\' 6"  (1.676 m)   Wt 222 lb 12.8 oz (101.1 kg)   BMI 35.96 kg/m    Physical Exam Vitals and nursing note reviewed.  Constitutional:      Appearance: She is well-developed.  HENT:     Head: Normocephalic and atraumatic.  Eyes:     Conjunctiva/sclera: Conjunctivae normal.  Pulmonary:     Effort: Pulmonary effort is normal.  Abdominal:     Palpations: Abdomen is soft.  Musculoskeletal:     Cervical back: Normal range of motion.  Skin:    General: Skin is warm  and dry.     Capillary Refill: Capillary refill takes less than 2 seconds.     Comments: No digital ulcerations or signs of gangrene. No signs of sclerodactyly.   Neurological:     Mental Status: She is alert and oriented to person, place, and time.  Psychiatric:        Behavior: Behavior normal.     Musculoskeletal Exam: C-spine painful ROM.  Trapezius muscle tension and tenderness bilaterally.  Painful ROM of lumbar spine.  Shoulder joints, elbow joints, wrist joints, MCPs, PIPs, and DIPs good ROM.  Complete fist formation bilaterally.  Hip joints, knee joints, and ankle joints good ROM with no discomfort.  No warmth or effusion of knee joints.  No tenderness or swelling of ankle joints.   CDAI Exam: CDAI Score: -- Patient Global: --; Provider Global: -- Swollen: --; Tender: -- Joint Exam 03/23/2021   No joint exam has been documented for this visit   There is currently no information documented on the homunculus. Go to the Rheumatology activity and complete the homunculus  joint exam.  Investigation: No additional findings.  Imaging: MR CERVICAL SPINE WO CONTRAST  Result Date: 03/20/2021 CLINICAL DATA:  Initial evaluation for neck pain for 6 months. EXAM: MRI CERVICAL SPINE WITHOUT CONTRAST TECHNIQUE: Multiplanar, multisequence MR imaging of the cervical spine was performed. No intravenous contrast was administered. COMPARISON:  Radiograph from 11/11/2020 as well as previous MRI from 09/23/2015. FINDINGS: Alignment: Straightening of the normal cervical lordosis. No listhesis. Vertebrae: Vertebral body height maintained without acute or chronic fracture. Bone marrow signal intensity within normal limits. No discrete or worrisome osseous lesions. No abnormal marrow edema. Cord: Normal signal morphology. Posterior Fossa, vertebral arteries, paraspinal tissues: Empty sella partially visualized. Visualized brain and posterior fossa otherwise unremarkable. Craniocervical junction normal. Paraspinous and prevertebral soft tissues within normal limits. Normal flow voids seen within the vertebral arteries bilaterally. Disc levels: C2-C3: Negative interspace. Minimal right greater than left facet hypertrophy. No canal or foraminal stenosis. C3-C4:  Unremarkable. C4-C5:  Unremarkable. C5-C6: Minimal anterior reactive endplate spurring without significant disc bulge. No stenosis. C6-C7: Mild anterior reactive endplate spurring. No significant disc bulge. No canal or foraminal stenosis. C7-T1: Mild reactive endplate spurring without significant disc bulge. No canal or foraminal stenosis. Visualized upper thoracic spine demonstrates no significant finding. IMPRESSION: 1. No significant disc pathology, stenosis, or evidence for neural impingement within the cervical spine. 2. Mild anterior reactive endplate spurring at P3-8 through C7-T1. 3. Empty sella. While this finding is often incidental in nature and of no clinical significance, this can also be seen in the setting of idiopathic  intracranial hypertension. Electronically Signed   By: Jeannine Boga M.D.   On: 03/20/2021 20:38    Recent Labs: Lab Results  Component Value Date   WBC 9.6 10/12/2020   HGB 13.8 10/12/2020   PLT 336 10/12/2020   NA 137 10/12/2020   K 3.3 (L) 10/12/2020   CL 104 10/12/2020   CO2 19 (L) 10/12/2020   GLUCOSE 106 (H) 10/12/2020   BUN 8 10/12/2020   CREATININE 0.65 10/12/2020   BILITOT 0.4 10/12/2020   ALKPHOS 107 10/12/2020   AST 21 10/12/2020   ALT 18 10/12/2020   PROT 6.5 12/06/2020   ALBUMIN 4.4 10/12/2020   CALCIUM 9.8 10/12/2020   GFRAA 89 07/15/2020    Speciality Comments: No specialty comments available.  Procedures:  No procedures performed Allergies: Patient has no known allergies.   Assessment / Plan:     Visit  Diagnoses: Raynaud's disease without gangrene - She continues to have intermittent symptoms of Raynaud's several times per week.  She has not noticed any new or worsening of symptoms.  No digital ulcerations or signs of gangrene were noted.  No signs of sclerodactyly noted.  Lab work from 01/19/2021 was reviewed with the patient today in the office: cryoglobulin not detected, beta-2 glycoprotein antibodies negative, cardiolipin antibodies negative, SCL 70 negative, lupus anticoagulant not detected, and ANCA screen negative.  All questions were addressed.  She has no other clinical features of autoimmune disease at this time. Discussed the importance of avoiding triggers such as tobacco smoke, exposure to cold temperatures, and stress.  She was advised to notify us if she develops any new or worsening symptoms.  She will follow-up in the office in 6 months.  Myofascial pain: She has ongoing myalgias and muscle tenderness due to underlying myofascial pain.  She has trapezius muscle tension and tenderness bilaterally.  She continues to follow-up with pain management on a regular basis.  She has noticed some increased brain fog.  We discussed the importance of  regular exercise and good sleep hygiene.  She will remain on Cymbalta as prescribed.  DDD (degenerative disc disease), cervical -She continues to have chronic neck pain and stiffness.  X-rays from November 11, 2020 showed mild degenerative disc disease.  She was referred to physical therapy by her pain management specialist.  She did not notice much improvement in her symptoms with PT so an MRI of the C-spine was ordered on 03/20/2021.  MRI results were reviewed today in the office: No significant disc pathology, stenosis, or evidence of for neural impingement within the C-spine was noted.  She does have mild anterior reactive endplate spurring at Q2-I2 through C7 and T1.  DDD (degenerative disc disease), lumbar - L5-S1 bilateral foraminal stenosis was noted on her MRI May 09, 2020.  Levoscoliosis was noted.  She continues to have chronic lower back pain and stiffness.  She is followed by pain management on a regular basis.  She has had injections in the past which alleviated her discomfort.  She is also going to physical therapy.  Chronic pain of both knees - X-rays of bilateral knee joints were unremarkable on 11/11/2020.  She continues to experience intermittent pain in both knee joints.  No warmth or effusion was noted on examination today.  Hx of migraine headaches: She continues to have migraines on a weekly basis.  Her migraines typically last several days.  She remains on ajoy as prescribed.   Paresthesia: About 2 and half weeks ago she experienced numbness on the right side of her face which was brief and self resolving.  She did not experience any vision changes at that time.  She did not notice a rash or pain on the side of her face.  She has not had any recurrence of symptoms.  She has not evaluated in the emergency room or by any other providers during that time.  We discussed the importance of calling her neurologist to schedule an urgent appointment for further evaluation.  Other medical  conditions are listed as follows:  Essential hypertension: Blood pressure is 116/76 today in the office.  Type 2 diabetes mellitus without complication, without long-term current use of insulin ([facility])  Increased intracranial pressure: She had an MRI of the C-spine on 03/20/2020 which revealed: Empty sella which can often be incidental and have no clinical significance but it can also be seen in the setting of idiopathic  intracranial hypertension.  Gastroesophageal reflux disease with esophagitis without hemorrhage  PTSD (post-traumatic stress disorder)  Attention deficit hyperactivity disorder (ADHD), predominantly inattentive type  Anxiety and depression  Former smoker - 1/2 PPDx 25 years, quit 2021  Orders: No orders of the defined types were placed in this encounter.  No orders of the defined types were placed in this encounter.     Follow-Up Instructions: Return in about 6 months (around 09/23/2021) for Raynaud's disease, myofascial pain, DDD.   Ofilia Neas, PA-C  Note - This record has been created using Dragon software.  Chart creation errors have been sought, but may not always  have been located. Such creation errors do not reflect on  the standard of medical care.

## 2021-03-17 ENCOUNTER — Other Ambulatory Visit: Payer: Self-pay | Admitting: Pain Medicine

## 2021-03-17 DIAGNOSIS — M25519 Pain in unspecified shoulder: Secondary | ICD-10-CM

## 2021-03-17 DIAGNOSIS — M542 Cervicalgia: Secondary | ICD-10-CM

## 2021-03-17 DIAGNOSIS — R519 Headache, unspecified: Secondary | ICD-10-CM

## 2021-03-17 DIAGNOSIS — G8929 Other chronic pain: Secondary | ICD-10-CM

## 2021-03-20 ENCOUNTER — Ambulatory Visit
Admission: RE | Admit: 2021-03-20 | Discharge: 2021-03-20 | Disposition: A | Discharge status: Home / Self Care | Payer: No Typology Code available for payment source | Source: Ambulatory Visit | Attending: Pain Medicine | Admitting: Pain Medicine

## 2021-03-20 DIAGNOSIS — M542 Cervicalgia: Secondary | ICD-10-CM

## 2021-03-20 DIAGNOSIS — G8929 Other chronic pain: Secondary | ICD-10-CM

## 2021-03-20 DIAGNOSIS — M25519 Pain in unspecified shoulder: Secondary | ICD-10-CM

## 2021-03-20 DIAGNOSIS — R519 Headache, unspecified: Secondary | ICD-10-CM

## 2021-03-20 IMAGING — MR MR CERVICAL SPINE W/O CM
4 of 5 series · 29 of 48 positions shown · non-contrast
Comparison: Radiograph from [DATE] as well as previous MRI from
[DATE].

CLINICAL DATA: Initial evaluation for neck pain for 6 months.

EXAM:
MRI CERVICAL SPINE WITHOUT CONTRAST
TECHNIQUE: Multiplanar, multisequence MR imaging of the cervical spine was
performed. No intravenous contrast was administered.

[Series 2: T2 · sagittal · 3.0mm · 0.66mm/px · 8 of 18 slices shown (1 of 2)]
[im 1/18]
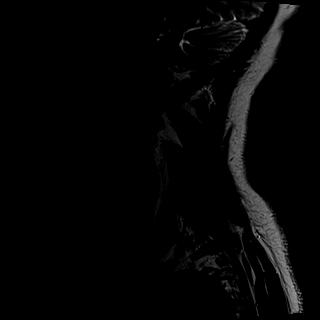
[im 3/18]
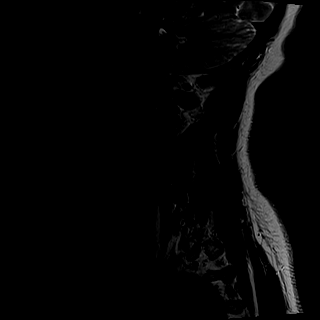
[im 5/18]
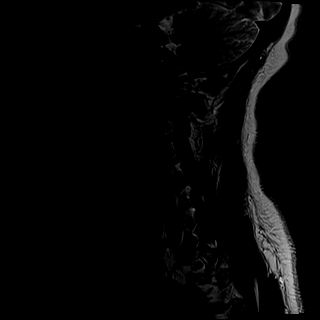
[im 8/18]
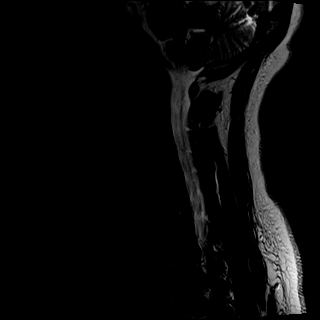
[im 10/18]
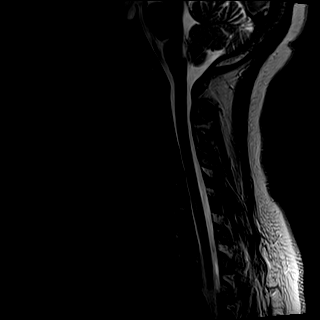
[im 13/18]
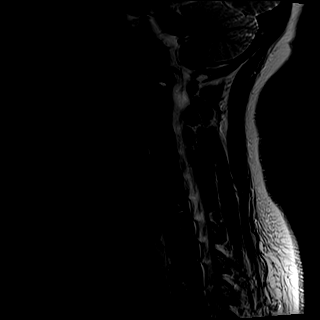
[im 15/18]
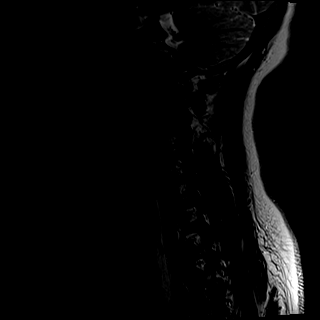
[im 18/18]
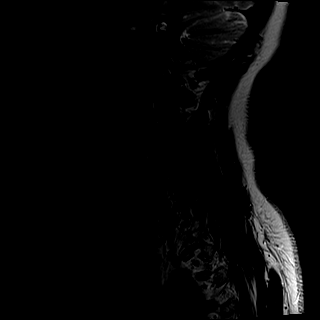

[Series 3: T1 · sagittal · 3.0mm · 0.41mm/px · 8 of 18 slices shown]
[im 1/18]
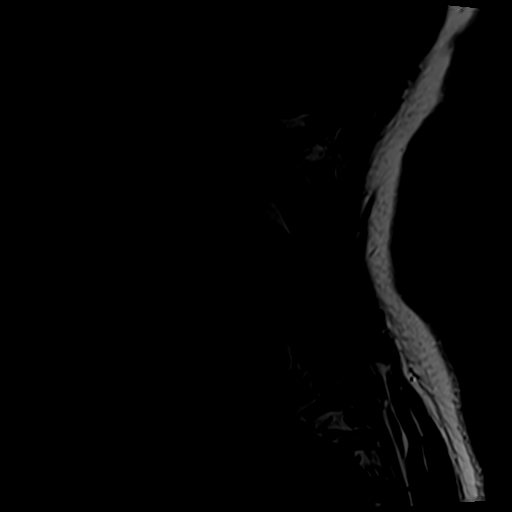
[im 3/18]
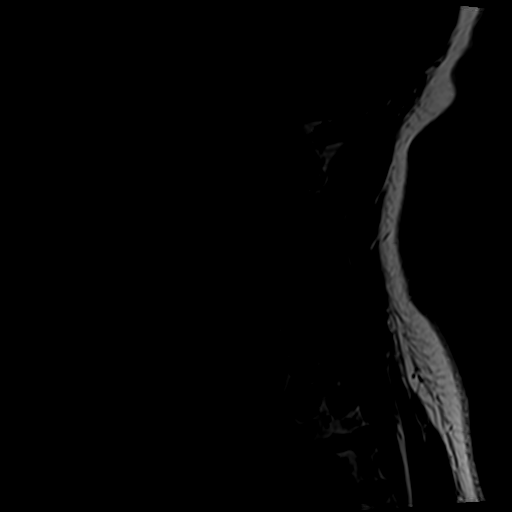
[im 5/18]
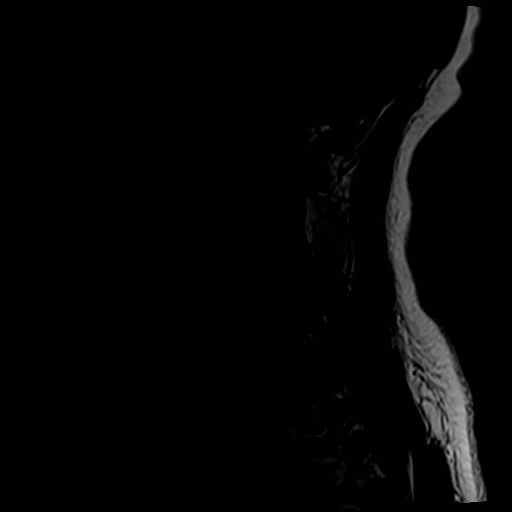
[im 8/18]
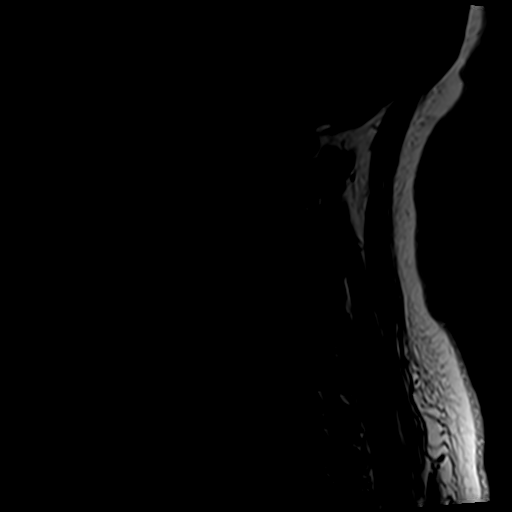
[im 10/18]
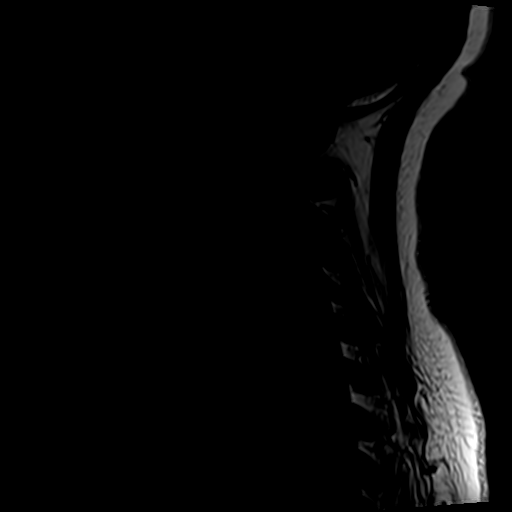
[im 13/18]
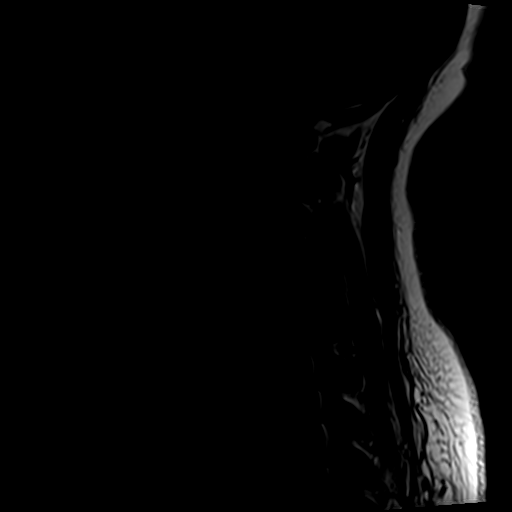
[im 15/18]
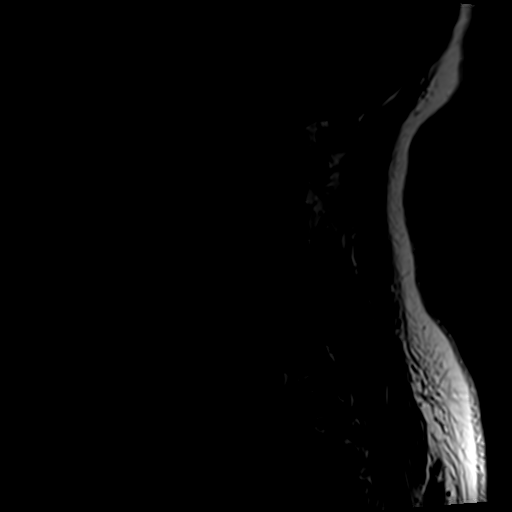
[im 18/18]
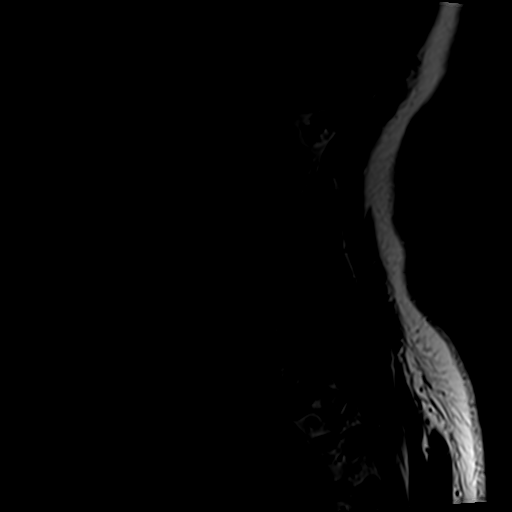

[Series 4: tir sag · sagittal · 3.0mm · 0.41mm/px · 4 of 18 slices shown]
[im 1/18]
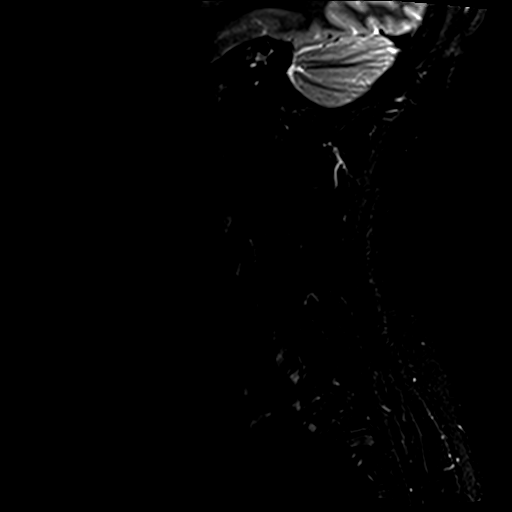
[im 3/18]
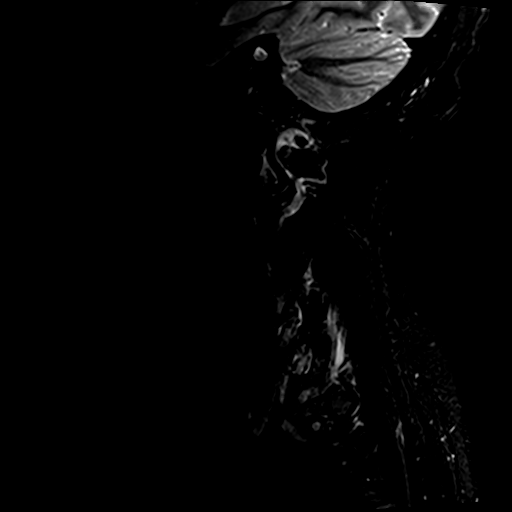
[im 10/18]
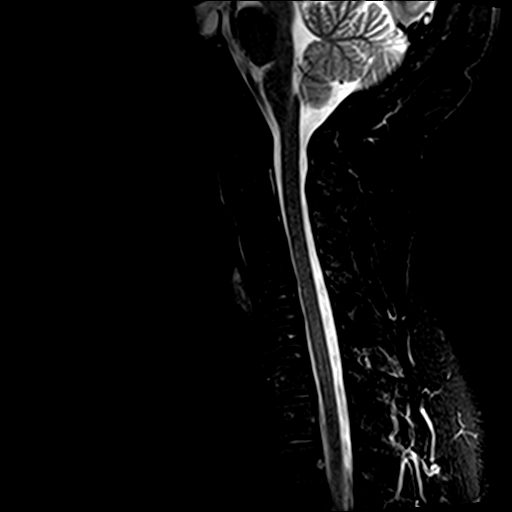
[im 15/18]
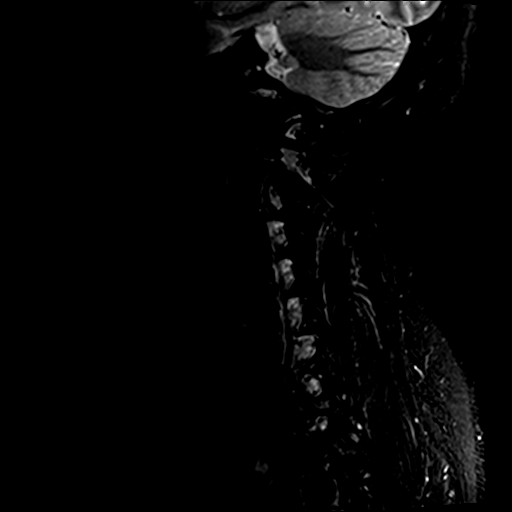

[Series 6: T2 · axial · 3.0mm · 0.70mm/px · z∈[-49,+30]mm · 9 of 25 slices shown (2 of 2)]
[im 1/25]
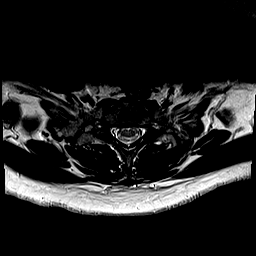
[im 5/25]
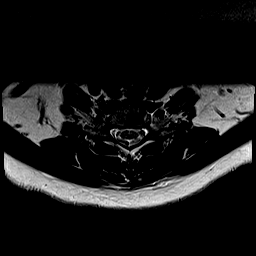
[im 7/25]
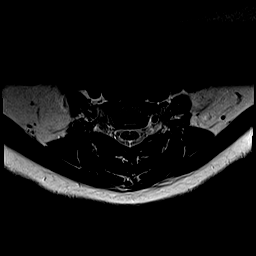
[im 11/25]
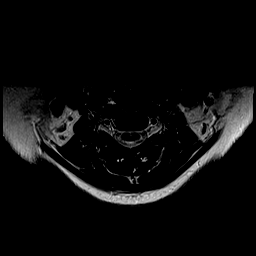
[im 14/25]
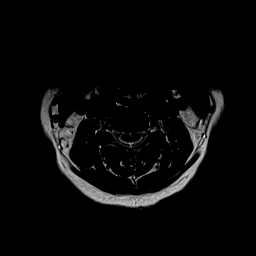
[im 18/25]
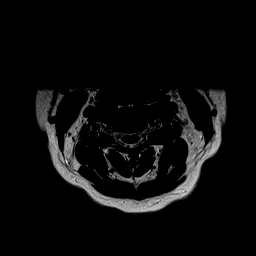
[im 20/25]
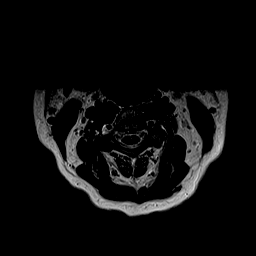
[im 22/25]
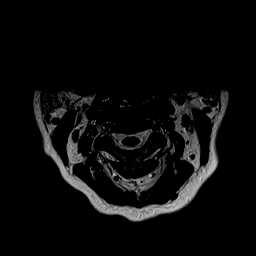
[im 25/25]
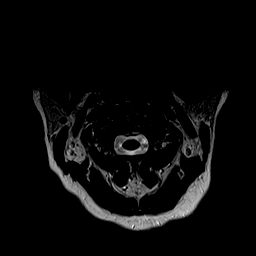

[29 of 48 positions shown; findings below may reference images not displayed]

FINDINGS: Alignment: Straightening of the normal cervical lordosis. No
listhesis.

Vertebrae: Vertebral body height maintained without acute or chronic
fracture. Bone marrow signal intensity within normal limits. No
discrete or worrisome osseous lesions. No abnormal marrow edema.

Cord: Normal signal morphology.

Posterior Fossa, vertebral arteries, paraspinal tissues: Empty sella
partially visualized. Visualized brain and posterior fossa otherwise
unremarkable. Craniocervical junction normal. Paraspinous and
prevertebral soft tissues within normal limits. Normal flow voids
seen within the vertebral arteries bilaterally.

Disc levels:

C2-C3: Negative interspace. Minimal right greater than left facet
hypertrophy. No canal or foraminal stenosis.

C3-C4:  Unremarkable.

C4-C5:  Unremarkable.

C5-C6: Minimal anterior reactive endplate spurring without
significant disc bulge. No stenosis.

C6-C7: Mild anterior reactive endplate spurring. No significant disc
bulge. No canal or foraminal stenosis.

C7-T1: Mild reactive endplate spurring without significant disc
bulge. No canal or foraminal stenosis.

Visualized upper thoracic spine demonstrates no significant finding.
IMPRESSION: 1. No significant disc pathology, stenosis, or evidence for neural
impingement within the cervical spine.
2. Mild anterior reactive endplate spurring at C5-6 through C7-T1.
3. Empty sella. While this finding is often incidental in nature and
of no clinical significance, this can also be seen in the setting of
idiopathic intracranial hypertension.

## 2021-03-21 ENCOUNTER — Encounter: Payer: Self-pay | Admitting: Nurse Practitioner

## 2021-03-22 ENCOUNTER — Other Ambulatory Visit: Payer: No Typology Code available for payment source

## 2021-03-23 ENCOUNTER — Ambulatory Visit (INDEPENDENT_AMBULATORY_CARE_PROVIDER_SITE_OTHER): Payer: No Typology Code available for payment source | Admitting: Physician Assistant

## 2021-03-23 ENCOUNTER — Other Ambulatory Visit: Payer: Self-pay | Admitting: Neurology

## 2021-03-23 ENCOUNTER — Other Ambulatory Visit: Payer: Self-pay

## 2021-03-23 ENCOUNTER — Encounter: Payer: Self-pay | Admitting: Physician Assistant

## 2021-03-23 VITALS — BP 116/76 | HR 96 | Resp 16 | Ht 66.0 in | Wt 222.8 lb

## 2021-03-23 DIAGNOSIS — M5030 Other cervical disc degeneration, unspecified cervical region: Secondary | ICD-10-CM

## 2021-03-23 DIAGNOSIS — Z8669 Personal history of other diseases of the nervous system and sense organs: Secondary | ICD-10-CM

## 2021-03-23 DIAGNOSIS — M5136 Other intervertebral disc degeneration, lumbar region: Secondary | ICD-10-CM | POA: Diagnosis not present

## 2021-03-23 DIAGNOSIS — E119 Type 2 diabetes mellitus without complications: Secondary | ICD-10-CM

## 2021-03-23 DIAGNOSIS — I7300 Raynaud's syndrome without gangrene: Secondary | ICD-10-CM | POA: Diagnosis not present

## 2021-03-23 DIAGNOSIS — I10 Essential (primary) hypertension: Secondary | ICD-10-CM

## 2021-03-23 DIAGNOSIS — G932 Benign intracranial hypertension: Secondary | ICD-10-CM

## 2021-03-23 DIAGNOSIS — Z87891 Personal history of nicotine dependence: Secondary | ICD-10-CM

## 2021-03-23 DIAGNOSIS — F419 Anxiety disorder, unspecified: Secondary | ICD-10-CM

## 2021-03-23 DIAGNOSIS — R202 Paresthesia of skin: Secondary | ICD-10-CM

## 2021-03-23 DIAGNOSIS — F32A Depression, unspecified: Secondary | ICD-10-CM

## 2021-03-23 DIAGNOSIS — F4310 Post-traumatic stress disorder, unspecified: Secondary | ICD-10-CM

## 2021-03-23 DIAGNOSIS — M25562 Pain in left knee: Secondary | ICD-10-CM

## 2021-03-23 DIAGNOSIS — M7918 Myalgia, other site: Secondary | ICD-10-CM

## 2021-03-23 DIAGNOSIS — K2100 Gastro-esophageal reflux disease with esophagitis, without bleeding: Secondary | ICD-10-CM

## 2021-03-23 DIAGNOSIS — M25561 Pain in right knee: Secondary | ICD-10-CM

## 2021-03-23 DIAGNOSIS — G8929 Other chronic pain: Secondary | ICD-10-CM

## 2021-03-23 DIAGNOSIS — F900 Attention-deficit hyperactivity disorder, predominantly inattentive type: Secondary | ICD-10-CM

## 2021-04-02 ENCOUNTER — Other Ambulatory Visit: Payer: Self-pay | Admitting: Nurse Practitioner

## 2021-04-07 ENCOUNTER — Other Ambulatory Visit: Payer: Self-pay | Admitting: Neurology

## 2021-04-21 ENCOUNTER — Other Ambulatory Visit: Payer: Self-pay

## 2021-04-21 ENCOUNTER — Ambulatory Visit (INDEPENDENT_AMBULATORY_CARE_PROVIDER_SITE_OTHER): Payer: No Typology Code available for payment source | Admitting: Nurse Practitioner

## 2021-04-21 ENCOUNTER — Encounter: Payer: Self-pay | Admitting: Nurse Practitioner

## 2021-04-21 VITALS — BP 114/68 | HR 104 | Temp 97.9°F | Ht 66.0 in | Wt 224.4 lb

## 2021-04-21 DIAGNOSIS — I10 Essential (primary) hypertension: Secondary | ICD-10-CM

## 2021-04-21 DIAGNOSIS — Z6836 Body mass index (BMI) 36.0-36.9, adult: Secondary | ICD-10-CM | POA: Diagnosis not present

## 2021-04-21 DIAGNOSIS — G4709 Other insomnia: Secondary | ICD-10-CM | POA: Diagnosis not present

## 2021-04-21 MED ORDER — HYDROXYZINE PAMOATE 50 MG PO CAPS: ORAL_CAPSULE | ORAL | 1 refills | Status: DC

## 2021-04-21 MED ORDER — PHENTERMINE HCL 15 MG PO CAPS: 15.0000 mg | ORAL_CAPSULE | ORAL | 1 refills | Status: DC

## 2021-04-21 NOTE — Progress Notes (Signed)
I,Katawbba Wiggins,acting as a Education administrator for [employer], FNP.,have documented all relevant documentation on the behalf of [name], FNP,as directed by  [name], FNP while in the presence of [name], [place].   This visit occurred during the SARS-CoV-2 public health emergency.  Safety protocols were in place, including screening questions prior to the visit, additional usage of staff PPE, and extensive cleaning of exam room while observing appropriate contact time as indicated for disinfecting solutions.  Subjective:     Patient ID: Virginia Douglas , female    DOB: 1975-09-07 , 45 y.o.   MRN: [id number]   Chief Complaint  Patient presents with   Hypertension     HPI  The patient is here today for a blood pressure f/u.  Wt Readings from Last 3 Encounters: 04/21/21 : 224 lb 6.4 oz (101.8 kg) 03/23/21 : 222 lb 12.8 oz (101.1 kg) 02/28/21 : 234 lb 9.6 oz (106.4 kg)  She is taking phentermine 15 mg daily, takes first thing in the morning.   Hydroxyzine has been effective however since the passing of her grandmother she has been having more trouble sleeping. She would like to increase the dose    Hypertension This is a chronic problem. The current episode started more than 1 year ago. The problem is unchanged. The problem is controlled. There are no associated agents to hypertension. Risk factors for coronary artery disease include obesity and sedentary lifestyle. Past treatments include diuretics and ACE inhibitors. The current treatment provides significant improvement. Compliance problems include exercise (unable due to back pain).  There is no history of chronic renal disease.  Insomnia Primary symptoms: fragmented sleep, frequent awakening.   The current episode started more than one month. The onset quality is sudden. The problem occurs nightly. The problem has been gradually worsening since onset. The symptoms are aggravated by family issues (tragic death of her  mother). PMH includes: no hypertension.     Past Medical History:  Diagnosis Date   Anemia    iron deficiency   Diabetes ([place])    Edema    Migraine headache      Family History  Problem Relation Age of Onset   Diabetes Mother    Hypertension Mother    Diabetes Father    Hypertension Father    Kidney failure Father    Hypertension Brother    Diabetes Maternal Grandmother    Pancreatic cancer Maternal Grandmother    Diabetes Paternal Grandmother    Breast cancer Paternal Grandmother    Stroke Paternal Grandfather    Cancer Maternal Aunt        breast   Breast cancer Maternal Aunt    Healthy Son    Healthy Son      Current Outpatient Medications:    acetaZOLAMIDE (DIAMOX) 250 MG tablet, TAKE 1 TABLET BY MOUTH TWICE A DAY, Disp: 60 tablet, Rfl: 2   ADDERALL XR 30 MG 24 hr capsule, Take 30 mg by mouth daily., Disp: , Rfl:    atorvastatin (LIPITOR) 10 MG tablet, TAKE 1 TABLET BY MOUTH EVERY DAY, Disp: 90 tablet, Rfl: 1   Blood Glucose Monitoring Suppl (ONETOUCH VERIO IQ SYSTEM) w/Device KIT, by Does not apply route. Use to check blood sugars twice, Disp: , Rfl:    buPROPion (WELLBUTRIN XL) 150 MG 24 hr tablet, TAKE 1 TABLET BY MOUTH EVERY DAY IN THE MORNING, Disp: 90 tablet, Rfl: 1   clonazePAM (KLONOPIN) 0.5 MG tablet, TAKE 1 TABLET BY MOUTH THREE TIMES A  DAY AS NEEDED FOR ANXIETY, Disp: 30 tablet, Rfl: 1   DULoxetine (CYMBALTA) 60 MG capsule, TAKE 1 CAPSULE BY MOUTH EVERY DAY, Disp: 90 capsule, Rfl: 0   eletriptan (RELPAX) 40 MG tablet, TAKE 1 TABLET AS NEEDED FOR MIGRAINE/ HEADACHE. MAY REPEAT IN 2 HRS IF HEADACHE PERSISTS OR RECURS., Disp: 10 tablet, Rfl: 1   Fremanezumab-vfrm (AJOVY) 225 MG/1.5ML SOAJ, Inject 225 mg into the skin every 28 (twenty-eight) days., Disp: 1.68 mL, Rfl: 5   HYDROcodone-acetaminophen (NORCO) 10-325 MG tablet, Take 1 tablet by mouth as needed., Disp: , Rfl:    Lasmiditan Succinate (REYVOW) 100 MG TABS, Take 1 tablet by mouth daily as needed., Disp:  16 tablet, Rfl: 5   lisinopril-hydrochlorothiazide (ZESTORETIC) 10-12.5 MG tablet, TAKE 1 TABLET BY MOUTH EVERY DAY, Disp: 90 tablet, Rfl: 1   Loteprednol-Tobramycin (ZYLET OP), Apply to eye., Disp: , Rfl:    Semaglutide, 2 MG/DOSE, (OZEMPIC, 2 MG/DOSE,) 8 MG/3ML SOPN, Inject 2 mg into the skin once a week., Disp: 9 mL, Rfl: 1   topiramate (TOPAMAX) 50 MG tablet, TAKE 2 AND 1/2 TABLETS (125 MG TOTAL) BY MOUTH AT BEDTIME., Disp: 75 tablet, Rfl: 0   TURMERIC PO, Take 1 capsule by mouth daily., Disp: , Rfl:    Vitamin D, Ergocalciferol, (DRISDOL) 1.25 MG (50000 UNIT) CAPS capsule, TAKE 1 CAPSULE (50,000 UNITS TOTAL) BY MOUTH EVERY 7 (SEVEN) DAYS, Disp: 12 capsule, Rfl: 0   famotidine (PEPCID) 20 MG tablet, Take 1 tablet (20 mg total) by mouth 2 (two) times daily. (Patient not taking: Reported on 04/21/2021), Disp: 10 tablet, Rfl: 0   glucose blood test strip, 1 each by Other route as needed for other. Insert 1 by subcutaneous route 2 times every day check blood sugar before breakfast and dinner, Disp: , Rfl:    hydrOXYzine (VISTARIL) 50 MG capsule, Take 1-2 capsules by mouth as needed for sleep at bedtime, Disp: 90 capsule, Rfl: 1   Insulin Pen Needle (PEN NEEDLES) 32G X 4 MM MISC, 1 each by Does not apply route once a week., Disp: 30 each, Rfl: 3   phentermine 15 MG capsule, Take 1 capsule (15 mg total) by mouth every morning., Disp: 30 capsule, Rfl: 1   No Known Allergies   Review of Systems  Constitutional: Negative.   Respiratory: Negative.    Cardiovascular: Negative.   Gastrointestinal: Negative.   All other systems reviewed and are negative.   Today's Vitals   04/21/21 0920  BP: 114/68  Pulse: (!) 104  Temp: 97.9 F (36.6 C)  TempSrc: Oral  Weight: 224 lb 6.4 oz (101.8 kg)  Height: _0  (1.676 m)   Body mass index is 36.22 kg/m.  Wt Readings from Last 3 Encounters:  04/21/21 224 lb 6.4 oz (101.8 kg)  03/23/21 222 lb 12.8 oz (101.1 kg)  02/28/21 234 lb 9.6 oz (106.4 kg)     BP Readings from Last 3 Encounters:  04/21/21 114/68  03/23/21 116/76  02/28/21 132/84    Objective:  Physical Exam      Assessment And Plan:     1. Essential hypertension - BMP8+EGFR  2. Other insomnia Comments: Doing well with vistaril however increased dose to 1-2 tabs as needed Worsening may be related to loss of her grandmother - hydrOXYzine (VISTARIL) 50 MG capsule; Take 1-2 capsules by mouth as needed for sleep at bedtime  Dispense: 90 capsule; Refill: 1  3. Class 2 severe obesity due to excess calories with serious comorbidity and body mass  index (BMI) of 36.0 to 36.9 in adult [facility]) Comments: Congratulated on her 10 lb weight loss - phentermine 15 MG capsule; Take 1 capsule (15 mg total) by mouth every morning.  Dispense: 30 capsule; Refill: 1  She is encouraged to strive for BMI less than 30 to decrease cardiac risk. Advised to aim for at least 150 minutes of exercise per week.   Patient was given opportunity to ask questions. Patient verbalized understanding of the plan and was able to repeat key elements of the plan. All questions were answered to their satisfaction.  [name], FNP   I, [name], FNP, have reviewed all documentation for this visit. The documentation on 04/21/21 for the exam, diagnosis, procedures, and orders are all accurate and complete.   IF YOU HAVE BEEN REFERRED TO A SPECIALIST, IT MAY TAKE 1-2 WEEKS TO SCHEDULE/PROCESS THE REFERRAL. IF YOU HAVE NOT HEARD FROM US/SPECIALIST IN TWO WEEKS, PLEASE GIVE [place] A CALL AT [phone].   THE PATIENT IS ENCOURAGED TO PRACTICE SOCIAL DISTANCING DUE TO THE COVID-19 PANDEMIC.

## 2021-04-21 NOTE — Patient Instructions (Signed)
Cooking With Less Salt Cooking with less salt is one way to reduce the amount of sodium you get from food. Sodium is one of the elements that make up salt. It is found naturally in foods and is also added to certain foods. Depending on your condition and overall health, your health care provider or dietitian may recommend that you reduce your sodium intake. Most people should have less than 2,300 milligrams (mg) of sodium each day. If you have high blood pressure (hypertension), you may need to limit your sodium to 1,500 mg each day. Follow the tipsbelow to help reduce your sodium intake. What are tips for eating less sodium? Reading food labels  Check the food label before buying or using packaged ingredients. Always check the label for the serving size and sodium content. Look for products with no more than 140 mg of sodium in one serving. Check the % Daily Value column to see what percent of the daily recommended amount of sodium is provided in one serving of the product. Foods with 5% or less in this column are considered low in sodium. Foods with 20% or higher are considered high in sodium. Do not choose foods with salt as one of the first three ingredients on the ingredients list. If salt is one of the first three ingredients, it usually means the item is high in sodium.  Shopping Buy sodium-free or low-sodium products. Look for the following words on food labels: Low-sodium. Sodium-free. Reduced-sodium. No salt added. Unsalted. Always check the sodium content even if foods are labeled as low-sodium or no salt added. Buy fresh foods. Cooking Use herbs, seasonings without salt, and spices as substitutes for salt. Use sodium-free baking soda when baking. Grill, braise, or roast foods to add flavor with less salt. Avoid adding salt to pasta, rice, or hot cereals. Drain and rinse canned vegetables, beans, and meat before use. Avoid adding salt when cooking sweets and desserts. Cook with  low-sodium ingredients. What foods are high in sodium? Vegetables Regular canned vegetables (not low-sodium or reduced-sodium). Sauerkraut, pickled vegetables, and relishes. Olives. French fries. Onion rings. Regular canned tomato sauce and paste. Regular tomato and vegetable juice. Frozenvegetables in sauces. Grains Instant hot cereals. Bread stuffing, pancake, and biscuit mixes. Croutons. Seasoned rice or pasta mixes. Noodle soup cups. Boxed or frozen macaroni and cheese. Regular salted crackers. Self-rising flour. Rolls. Bagels. Flourtortillas and wraps. Meats and other proteins Meat or fish that is salted, canned, smoked, cured, spiced, or pickled. This includes bacon, ham, sausages, hot dogs, corned beef, chipped beef, meat loaves, salt pork, jerky, pickled herring, anchovies, regular canned tuna, andsardines. Salted nuts. Dairy Processed cheese and cheese spreads. Cheese curds. Blue cheese. Feta cheese.String cheese. Regular cottage cheese. Buttermilk. Canned milk. The items listed above may not be a complete list of foods high in sodium. Actual amounts of sodium may be different depending on processing. Contact a dietitian for more information. What foods are low in sodium? Fruits Fresh, frozen, or canned fruit with no sauce added. Fruit juice. Vegetables Fresh or frozen vegetables with no sauce added. "No salt added" canned vegetables. "No salt added" tomato sauce and paste. Low-sodium orreduced-sodium tomato and vegetable juice. Grains Noodles, pasta, quinoa, rice. Shredded or puffed wheat or puffed rice. Regular or quick oats (not instant). Low-sodium crackers. Low-sodium bread. Whole-grainbread and whole-grain pasta. Unsalted popcorn. Meats and other proteins Fresh or frozen whole meats, poultry (not injected with sodium), and fish with no sauce added. Unsalted nuts. Dried peas, beans, and   lentils without added salt. Unsalted canned beans. Eggs. Unsalted nut butters. Low-sodium canned  tunaor chicken. Dairy Milk. Soy milk. Yogurt. Low-sodium cheeses, such as Swiss, Monterey Jack, mozzarella, and ricotta. Sherbet or ice cream (keep to  cup per serving).Cream cheese. Fats and oils Unsalted butter or margarine. Other foods Homemade pudding. Sodium-free baking soda and baking powder. Herbs and spices.Low-sodium seasoning mixes. Beverages Coffee and tea. Carbonated beverages. The items listed above may not be a complete list of foods low in sodium. Actual amounts of sodium may be different depending on processing. Contact a dietitian for more information. What are some salt alternatives when cooking? The following are herbs, seasonings, and spices that can be used instead of salt to flavor your food. Herbs should be fresh or dried. Do not choose packaged mixes. Next to the name of the herb, spice, or seasoning aresome examples of foods you can pair it with. Herbs [place] leaves - Soups, meat and vegetable dishes, and spaghetti sauce. Basil - Italian dishes, soups, pasta, and fish dishes. Cilantro - Meat, poultry, and vegetable dishes. Chili powder - Marinades and Mexican dishes. Chives - Salad dressings and potato dishes. Cumin - Mexican dishes, couscous, and meat dishes. Dill - Fish dishes, sauces, and salads. Fennel - Meat and vegetable dishes, breads, and cookies. Garlic (do not use garlic salt) - Italian dishes, meat dishes, salad dressings, and sauces. Marjoram - Soups, potato dishes, and meat dishes. Oregano - Pizza and spaghetti sauce. Parsley - Salads, soups, pasta, and meat dishes. Rosemary - Italian dishes, salad dressings, soups, and red meats. Saffron - Fish dishes, pasta, and some poultry dishes. Sage - Stuffings and sauces. Tarragon - Fish and poultry dishes. Thyme - Stuffing, meat, and fish dishes. Seasonings Lemon juice - Fish dishes, poultry dishes, vegetables, and salads. Vinegar - Salad dressings, vegetables, and fish dishes. Spices Cinnamon - Sweet  dishes, such as cakes, cookies, and puddings. Cloves - Gingerbread, puddings, and marinades for meats. Curry - Vegetable dishes, fish and poultry dishes, and stir-fry dishes. Ginger - Vegetable dishes, fish dishes, and stir-fry dishes. Nutmeg - Pasta, vegetables, poultry, fish dishes, and custard. Summary Cooking with less salt is one way to reduce the amount of sodium that you get from food. Buy sodium-free or low-sodium products. Check the food label before using or buying packaged ingredients. Use herbs, seasonings without salt, and spices as substitutes for salt in foods. This information is not intended to replace advice given to you by your health care provider. Make sure you discuss any questions you have with your healthcare provider. Document Revised: 08/13/2019 Document Reviewed: 08/13/2019 Elsevier Patient Education  2022 [employer]  

## 2021-04-22 LAB — BMP8+EGFR
BUN/Creatinine Ratio: 11 (ref 9–23)
BUN: 8 mg/dL (ref 6–24)
CO2: 19 mmol/L — ABNORMAL LOW (ref 20–29)
Calcium: 9.6 mg/dL (ref 8.7–10.2)
Chloride: 102 mmol/L (ref 96–106)
Creatinine, Ser: 0.76 mg/dL (ref 0.57–1.00)
Glucose: 90 mg/dL (ref 65–99)
Sodium: 139 mmol/L (ref 134–144)
eGFR: 99 mL/min/{1.73_m2} (ref >59)

## 2021-04-24 ENCOUNTER — Other Ambulatory Visit: Payer: Self-pay | Admitting: Internal Medicine

## 2021-04-24 DIAGNOSIS — I10 Essential (primary) hypertension: Secondary | ICD-10-CM

## 2021-04-26 ENCOUNTER — Other Ambulatory Visit: Payer: Self-pay | Admitting: Neurology

## 2021-04-26 ENCOUNTER — Other Ambulatory Visit: Payer: Self-pay | Admitting: Nurse Practitioner

## 2021-04-26 DIAGNOSIS — E119 Type 2 diabetes mellitus without complications: Secondary | ICD-10-CM

## 2021-04-28 ENCOUNTER — Ambulatory Visit (INDEPENDENT_AMBULATORY_CARE_PROVIDER_SITE_OTHER): Payer: No Typology Code available for payment source | Admitting: Neurology

## 2021-04-28 ENCOUNTER — Encounter: Payer: Self-pay | Admitting: Neurology

## 2021-04-28 ENCOUNTER — Other Ambulatory Visit: Payer: Self-pay

## 2021-04-28 DIAGNOSIS — G932 Benign intracranial hypertension: Secondary | ICD-10-CM

## 2021-04-28 DIAGNOSIS — R200 Anesthesia of skin: Secondary | ICD-10-CM

## 2021-04-28 DIAGNOSIS — R29810 Facial weakness: Secondary | ICD-10-CM

## 2021-04-28 DIAGNOSIS — R202 Paresthesia of skin: Secondary | ICD-10-CM

## 2021-04-28 DIAGNOSIS — G43009 Migraine without aura, not intractable, without status migrainosus: Secondary | ICD-10-CM

## 2021-04-28 NOTE — Progress Notes (Signed)
NEUROLOGY FOLLOW UP OFFICE NOTE  Aroura Vasudevan [id number]  Assessment/Plan:   Migraine without aura, without status migrainosus, not intractable Episode of right facial weakness and numbness.  Still endorses numbness.  She looks to have trouble fully moving the right side of her mouth, but appears functional.  Will need to evaluate further. Numbness and tingling/neuralgia -longstanding history.  Precedes starting acetazolamide and topiramate.  Suspect numbness in hands carpal tunnel syndrome (involves first 2 digits, positive Phalen).   History of idiopathic intracranial hypertension Chronic low back pain with history of bilateral lumbosacral radiculopathy  MRI brain with and without contrast for right sided facial numbness/weakness Migraine prevention:  Ajovy Q4wks, topiramate 17m daily Migraine rescue:  Reyvow IIH therapy:  acetazolamide 2544mBID Neuropathic pain:  Cymbalta 6092maily Limit use of pain relievers to no more than 2 days out of week to prevent risk of rebound or medication-overuse headache. Keep headache diary Follow up 6 months.   Subjective:  LasTanganyika Bowlds a [age]ar old right-handed woman with diabetes and hypertension who follows up for migraines.   UPDATE: She had a repeat NCV-EMG of the right upper and lower extremities on 01/04/2021, which was normal.   In June, she woke up one morning and noted the right sided of her face was numb and mouth twisted.  She had difficulty drinking fluids and would drip out the side of her mouth.  She is not sure if she had trouble closing her eye or raising her eyebrow.  She had a slight headache but no facial pain, slurred speech, language difficulty or involvement of arm or leg.  It lasted about a week or a little bit longer.    In May, her insurance no longer would cover Aimovig.  She was instead started on Ajovy.   Intensity:  severe Duration:  30 to 60 minutes with Reyvow Frequency:  3 to 5 days a  month However, she has a migraine today that has not aborted.  She does report recent stressors affecting her headaches. Current NSAIDS:  none Current analgesics:  none Other current abortive:  none Current triptans: none Current ergotamine: None Current anti-emetic: Zofran 4 mg Current muscle relaxants: Robaxin Current anti-anxiolytic:  alprazolam Current sleep aide:  none Current Antihypertensive medications:  Lisinopril-HCTZ Current Antidepressant medications:  Cymbalta 66m104mily Current Anticonvulsant medications: Topiramate 125mg70mbedtime, acetazolamide 250mg 66me daily, Lyrica 100mg t24m times daily. Current anti-CGRP:  Ajovy Current Vitamins/Herbal/Supplements:  turmeric Current Antihistamines/Decongestants:  none Other therapy:  Reyvow (rescue) Hormone/birth control:  no    Caffeine: One soda a week Diet: Hydrates Exercise: Yes Depression: Yes; Anxiety: Yes Other pain: No Sleep hygiene: Varies   HISTORY: Onset: Remote history of migraines.  Controlled for many years.  Returned in May 2019.. LocatMarland Kitchenon:  Right sided head/ear radiating down right side of neck.  Does not radiate down the right arm. Quality:  Pounding in head, burning in neck Initial intensity:  Severe.  She denies  thunderclap headache or severe headache that wakes her from sleep. Aura:  no Prodrome:  no Postdrome:  no Associated symptoms:  Nausea, vomiting, photophobia, phonophobia, blurred vision.  She denies associated unilateral numbness or weakness. Initial duration:  1 day Initial Frequency:  2 days a week Initial Frequency of abortive medication: 2 days a week Triggers:  Emotional stress Relieving factors:  Heating pad, Robaxin Activity:  Aggravates   She was evaluated in the ED on 02/10/18, where CTA of head and neck was performed and personally reviewed.  It demonstrated empty sella but otherwise unremarkable for mass lesion, aneurysm or dissection.  She was diagnosed with idiopathic  intracranial hypertension diagnosed in 2019.  She is on Diamox 262m daily.  She is followed by ophthalmology.  Exam note from 10/10/18 stated "maybe slight nasal nerve elevation" but no definite signs of papilledema.   She has longstanding history of numbness and tingling in the feet.  NCV-EMG of lower extremities in 2017 was normal.  She reports left sided low back pain radiating down the side of the left leg with associated numbness in the side of leg and foot since early 2020  She had a lumbar X-ray which demonstrated mild dextroscoliosis apex L4.  No injury.  She also has endorsed right sided leg pain as well.  MRI of lumbar spine was performed on 05/09/2020 which was personally reviewed and showed mild lumbar spondylosis and degenerative disc disease with very mild bilateral foraminal stenosis at L5-S1.  She followed up with her PCP regarding right knee pain and swelling.  X-ray on 09/29/2020 was negative.  She reports generalized pain and numbness and tingling.  If she holds something, she may drop it.  Labs from 2021 include B12 388,  TSH  0.872, ANA negative, negative dsDNA, sed rate 13, CRP 10, RF negative.  Hgb A1c has increased over 2021 from 5.8 to 6.4.  Gabapentin previously used was ineffective for neuropathic pain.     Past NSAIDS:  Ibuprofen, naproxen Past analgesics:  tramadol 536mPast abortive triptans: Sumatriptan 100 mg, rizatriptan 1060meletriptan 26m27mst muscle relaxants:  Flexeril Past anti-emetic:  no Past antihypertensive medications:  no Past antidepressant medications:  nortriptyline 50mg88mused nausea/vomiting, problems sleeping) Past anticonvulsant medications:  gabapentin 300mg 20me daily (for neuropathic pain) Past CGRP inhibitor:  Aimovig 126mg (52mctive but insurance no longer would cover) Past vitamins/Herbal/Supplements:  no Past antihistamines/decongestants:  no Other past therapies:  no   Family history of headache:  no   MRI of cervical spine from 09/22/15  was personally reviewed and was unremarkable.    PAST MEDICAL HISTORY: Past Medical History:  Diagnosis Date   Anemia    iron deficiency   Diabetes (HCC)   Monroviaema    Migraine headache     MEDICATIONS: Current Outpatient Medications on File Prior to Visit  Medication Sig Dispense Refill   acetaZOLAMIDE (DIAMOX) 250 MG tablet TAKE 1 TABLET BY MOUTH TWICE A DAY 60 tablet 2   ADDERALL XR 30 MG 24 hr capsule Take 30 mg by mouth daily.     atorvastatin (LIPITOR) 10 MG tablet TAKE 1 TABLET BY MOUTH EVERY DAY 90 tablet 1   Blood Glucose Monitoring Suppl (ONETOUCH VERIO IQ SYSTEM) w/Device KIT by Does not apply route. Use to check blood sugars twice     buPROPion (WELLBUTRIN XL) 150 MG 24 hr tablet TAKE 1 TABLET BY MOUTH EVERY DAY IN THE MORNING 90 tablet 1   clonazePAM (KLONOPIN) 0.5 MG tablet TAKE 1 TABLET BY MOUTH THREE TIMES A DAY AS NEEDED FOR ANXIETY 30 tablet 1   DULoxetine (CYMBALTA) 60 MG capsule TAKE 1 CAPSULE BY MOUTH EVERY DAY 90 capsule 0   eletriptan (RELPAX) 40 MG tablet TAKE 1 TABLET AS NEEDED FOR MIGRAINE/ HEADACHE. MAY REPEAT IN 2 HRS IF HEADACHE PERSISTS OR RECURS. 10 tablet 1   famotidine (PEPCID) 20 MG tablet Take 1 tablet (20 mg total) by mouth 2 (two) times daily. (Patient not taking: Reported on 04/21/2021) 10 tablet 0   Fremanezumab-vfrm (AJOVY)  225 MG/1.5ML SOAJ Inject 225 mg into the skin every 28 (twenty-eight) days. 1.68 mL 5   glucose blood test strip 1 each by Other route as needed for other. Insert 1 by subcutaneous route 2 times every day check blood sugar before breakfast and dinner     HYDROcodone-acetaminophen (NORCO) 10-325 MG tablet Take 1 tablet by mouth as needed.     hydrOXYzine (VISTARIL) 50 MG capsule Take 1-2 capsules by mouth as needed for sleep at bedtime 90 capsule 1   Insulin Pen Needle (PEN NEEDLES) 32G X 4 MM MISC 1 each by Does not apply route once a week. 30 each 3   Lasmiditan Succinate (REYVOW) 100 MG TABS Take 1 tablet by mouth daily as  needed. 16 tablet 5   lisinopril-hydrochlorothiazide (ZESTORETIC) 10-12.5 MG tablet TAKE 1 TABLET BY MOUTH EVERY DAY 90 tablet 1   Loteprednol-Tobramycin (ZYLET OP) Apply to eye.     phentermine 15 MG capsule Take 1 capsule (15 mg total) by mouth every morning. 30 capsule 1   Semaglutide, 2 MG/DOSE, (OZEMPIC, 2 MG/DOSE,) 8 MG/3ML SOPN Inject 2 mg into the skin once a week. 9 mL 1   topiramate (TOPAMAX) 50 MG tablet TAKE 2 AND 1/2 TABLETS (125 MG TOTAL) BY MOUTH AT BEDTIME. 75 tablet 0   TURMERIC PO Take 1 capsule by mouth daily.     Vitamin D, Ergocalciferol, (DRISDOL) 1.25 MG (50000 UNIT) CAPS capsule TAKE 1 CAPSULE (50,000 UNITS TOTAL) BY MOUTH EVERY 7 (SEVEN) DAYS 12 capsule 0   [DISCONTINUED] metFORMIN (GLUCOPHAGE-XR) 500 MG 24 hr tablet Take 2 tablets (1,000 mg total) by mouth 2 (two) times daily. (Patient not taking: Reported on 06/05/2019) 30 tablet 0   [DISCONTINUED] Phentermine-Topiramate 11.25-69 MG CP24 Take 1 tablet by mouth daily. 30 capsule 1   No current facility-administered medications on file prior to visit.    ALLERGIES: No Known Allergies  FAMILY HISTORY: Family History  Problem Relation Age of Onset   Diabetes Mother    Hypertension Mother    Diabetes Father    Hypertension Father    Kidney failure Father    Hypertension Brother    Diabetes Maternal Grandmother    Pancreatic cancer Maternal Grandmother    Diabetes Paternal Grandmother    Breast cancer Paternal Grandmother    Stroke Paternal Grandfather    Cancer Maternal Aunt        breast   Breast cancer Maternal Aunt    Healthy Son    Healthy Son       Objective:  Blood pressure (!) 145/88, pulse (!) 101, resp. rate 18, height _0  (1.676 m), weight 224 lb (101.6 kg), SpO2 98 %. General: No acute distress.  Patient appears well-groomed.   Head:  Normocephalic/atraumatic Eyes:  Fundi examined but not visualized Neck: supple, no paraspinal tenderness, full range of motion Heart:  Regular rate and  rhythm Lungs:  Clear to auscultation bilaterally Back: No paraspinal tenderness Neurological Exam: alert and oriented to person, place, and time.  Speech fluent and not dysarthric, language intact.  Decreased sensation right V1-V3.  Difficulty moving up the right side of her mouth.  Exhibits right eyelid weakness when I manually open them against resistance, however forehead muscle movement intact and symmetric.  Otherwise, CN II-XII intact. Bulk and tone normal, muscle strength 5/5 throughout.  Sensation to pinprick and vibration slightly reduced on right upper and lower extremities.  Deep tendon reflexes 2+ throughout, toes downgoing.  Finger to nose testing intact.  Gait normal, Romberg negative.   Metta Clines, DO  CC: [name], FNP

## 2021-04-28 NOTE — Patient Instructions (Signed)
MRI of brain with and without contrast Continue Ajovy and topiramate Reyvow as needed

## 2021-04-30 ENCOUNTER — Other Ambulatory Visit: Payer: Self-pay | Admitting: Neurology

## 2021-05-03 ENCOUNTER — Other Ambulatory Visit: Payer: Self-pay | Admitting: Nurse Practitioner

## 2021-05-03 DIAGNOSIS — E119 Type 2 diabetes mellitus without complications: Secondary | ICD-10-CM

## 2021-05-05 ENCOUNTER — Other Ambulatory Visit: Payer: Self-pay | Admitting: Nurse Practitioner

## 2021-05-05 MED ORDER — OZEMPIC (1 MG/DOSE) 4 MG/3ML ~~LOC~~ SOPN
1.0000 mg | PEN_INJECTOR | SUBCUTANEOUS | 1 refills | Status: DC
Start: 2021-05-05 — End: 2021-11-18

## 2021-05-16 ENCOUNTER — Encounter: Payer: Self-pay | Admitting: Nurse Practitioner

## 2021-05-18 ENCOUNTER — Ambulatory Visit: Payer: No Typology Code available for payment source | Admitting: Neurology

## 2021-05-18 ENCOUNTER — Encounter: Payer: Self-pay | Admitting: Nurse Practitioner

## 2021-05-19 ENCOUNTER — Ambulatory Visit
Admission: RE | Admit: 2021-05-19 | Discharge: 2021-05-19 | Disposition: A | Discharge status: Home / Self Care | Payer: No Typology Code available for payment source | Source: Ambulatory Visit | Attending: Neurology | Admitting: Neurology

## 2021-05-19 DIAGNOSIS — R29810 Facial weakness: Secondary | ICD-10-CM

## 2021-05-19 DIAGNOSIS — R202 Paresthesia of skin: Secondary | ICD-10-CM

## 2021-05-19 DIAGNOSIS — R200 Anesthesia of skin: Secondary | ICD-10-CM

## 2021-05-19 IMAGING — MR MR HEAD WO/W CM
14 series · 48 of 48 positions shown · IV contrast (20 ml multihance)
Comparison: Cervical spine MRI [DATE]. CTA head and neck
[DATE]. Outside cervical spine MRI [DATE]. Head CT
[DATE].

CLINICAL DATA: 45-year-old female with headaches, brain fog, right
side facial weakness and numbness.

EXAM:
MRI HEAD WITHOUT AND WITH CONTRAST
TECHNIQUE: Multiplanar, multiecho pulse sequences of the brain and surrounding
structures were obtained without and with intravenous contrast.
CONTRAST:  20mL MULTIHANCE GADOBENATE DIMEGLUMINE 529 MG/ML IV SOLN

[Series 3: T1 · sagittal · 5.0mm · 0.45mm/px · 2 of 23 slices shown]
[im 1/23]
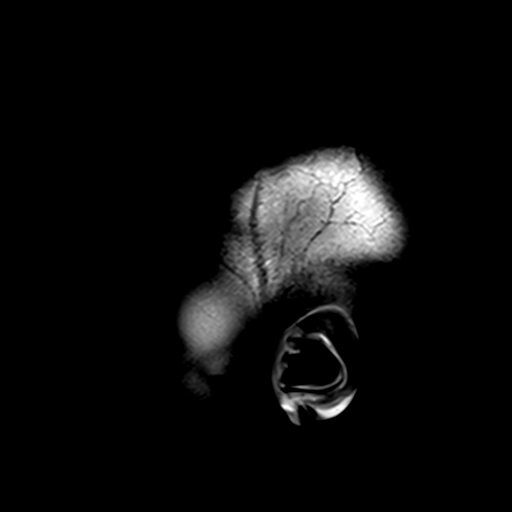
[im 23/23]
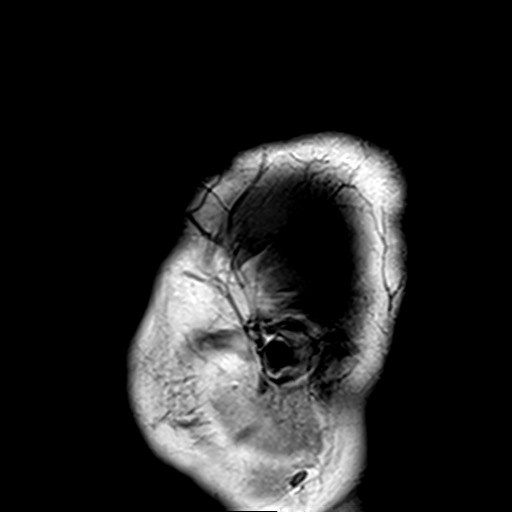

[Series 4: DWI · axial · 3.0mm · 1.80mm/px · z∈[-70,+85]mm · 7 of 106 slices shown (1 of 4)]
[im 1/106]
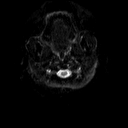
[im 18/106]
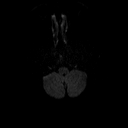
[im 36/106]
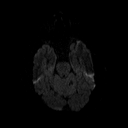
[im 53/106]
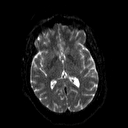
[im 71/106]
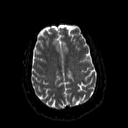
[im 88/106]
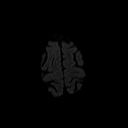
[im 106/106]
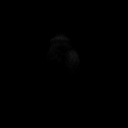

[Series 5: DWI · axial · 3.0mm · 1.80mm/px · z∈[-70,+85]mm · 3 of 50 slices shown (2 of 4)]
[im 1/50]
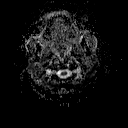
[im 25/50]
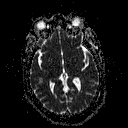
[im 50/50]
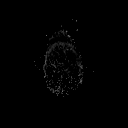

[Series 6: T2 · axial · 5.0mm · 0.60mm/px · 1 of 22 slices shown (1 of 2)]
[im 1/22]
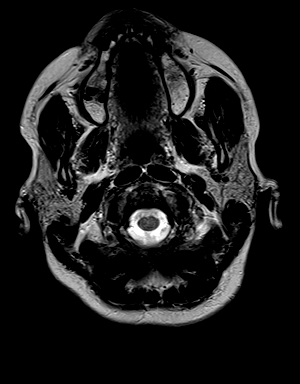

[Series 7: mip_images(sw) · axial · 32.0mm · 0.90mm/px · z∈[-48,+63]mm · 2 of 29 slices shown]
[im 1/29]
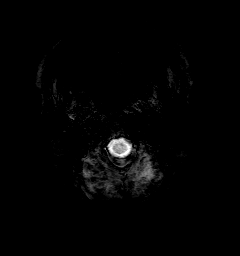
[im 29/29]
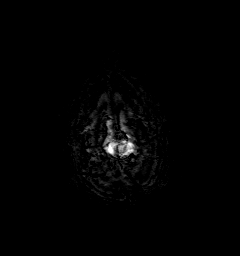

[Series 8: swi_images · axial · 4.0mm · 0.90mm/px · z∈[-62,+77]mm · 2 of 36 slices shown]
[im 1/36]
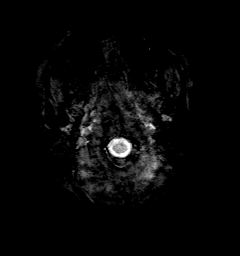
[im 36/36]
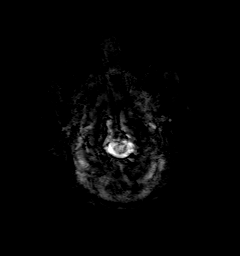

[Series 9: FLAIR · axial · 3.0mm · 0.45mm/px · z∈[-59,+75]mm · 2 of 30 slices shown]
[im 1/30]
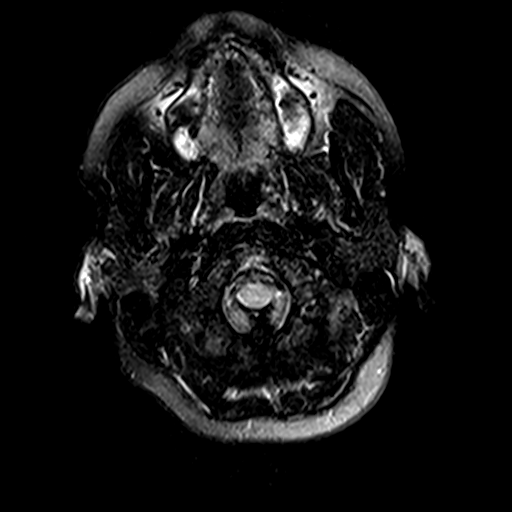
[im 30/30]
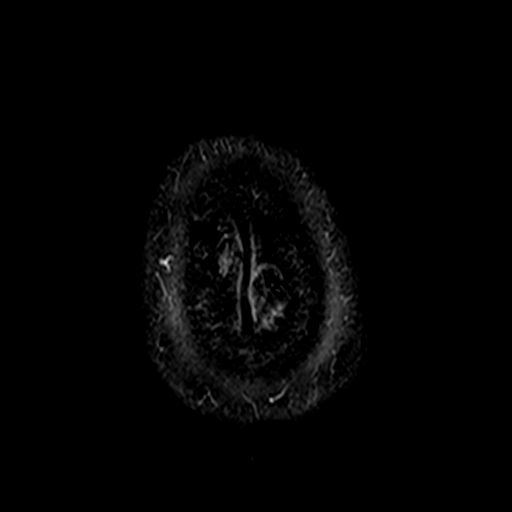

[Series 10: DWI · coronal · 5.0mm · 1.80mm/px · 4 of 70 slices shown (3 of 4)]
[im 1/70]
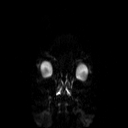
[im 24/70]
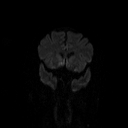
[im 47/70]
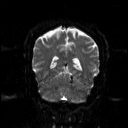
[im 70/70]
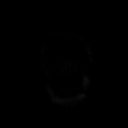

[Series 11: DWI · coronal · 5.0mm · 1.80mm/px · 2 of 35 slices shown (4 of 4)]
[im 1/35]
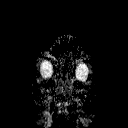
[im 35/35]
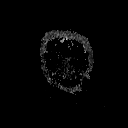

[Series 12: t1_mpr_tra · axial · 1.0mm · 0.75mm/px · z∈[-72,+70]mm · 9 of 144 slices shown]
[im 1/144]
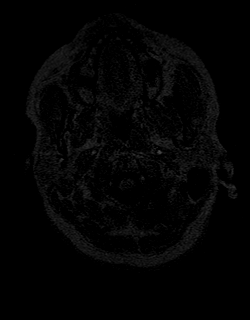
[im 18/144]
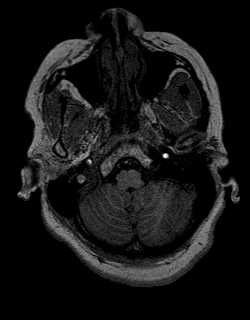
[im 36/144]
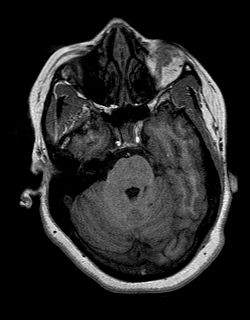
[im 54/144]
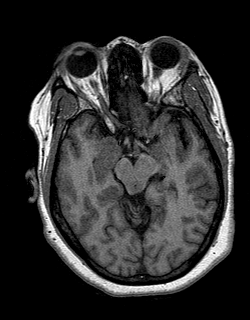
[im 72/144]
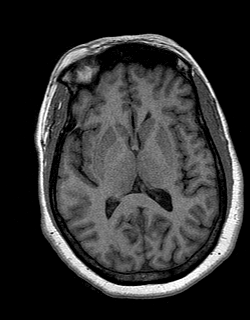
[im 90/144]
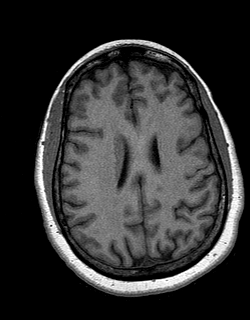
[im 108/144]
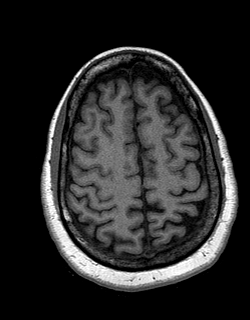
[im 126/144]
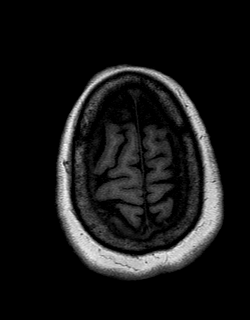
[im 144/144]
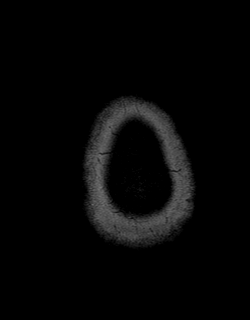

[Series 13: T2 · coronal · 5.0mm · 0.45mm/px · 2 of 27 slices shown (2 of 2)]
[im 1/27]
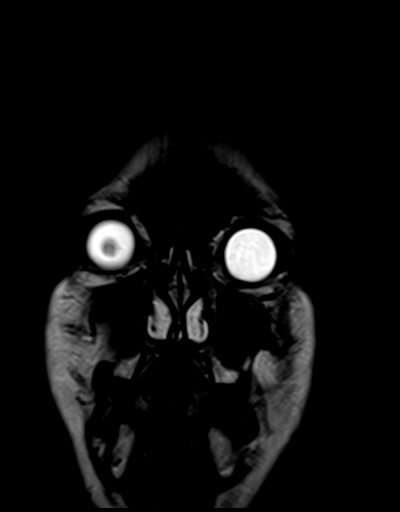
[im 27/27]
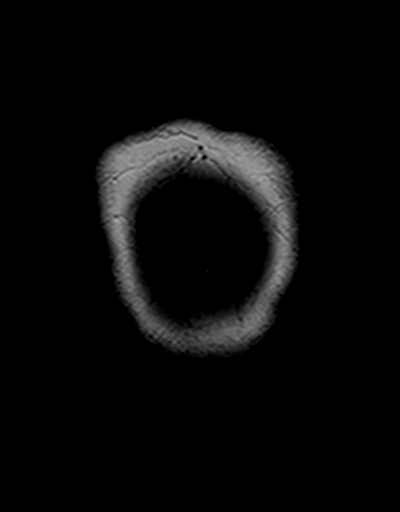

[Series 14: t1_mpr_tra post · axial · 1.0mm · 0.75mm/px · z∈[-72,+70]mm · 9 of 144 slices shown]
[im 1/144]
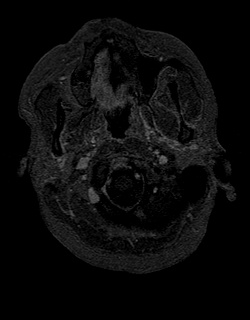
[im 18/144]
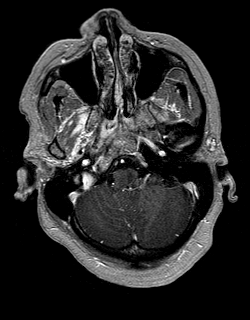
[im 36/144]
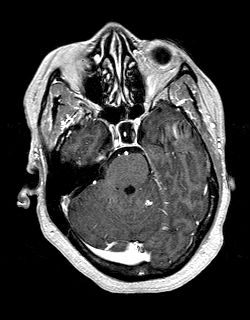
[im 54/144]
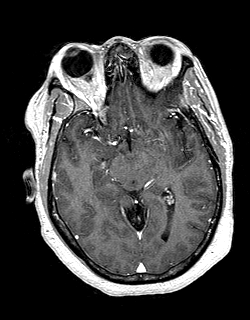
[im 72/144]
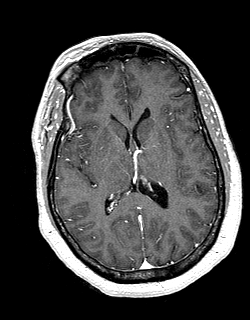
[im 90/144]
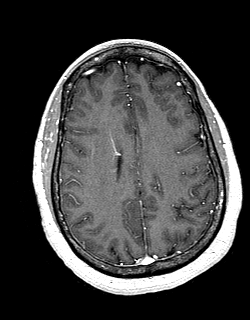
[im 108/144]
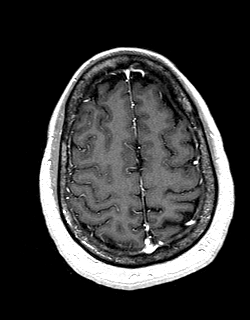
[im 126/144]
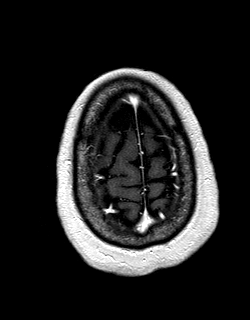
[im 144/144]
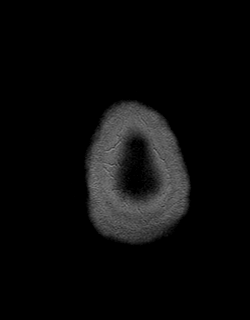

[Series 15: post cor · coronal · 5.0mm · 0.45mm/px · 2 of 27 slices shown]
[im 1/27]
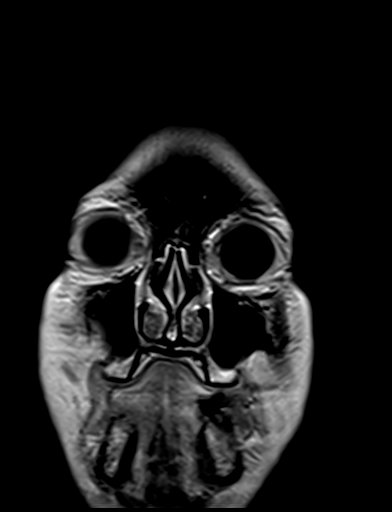
[im 27/27]
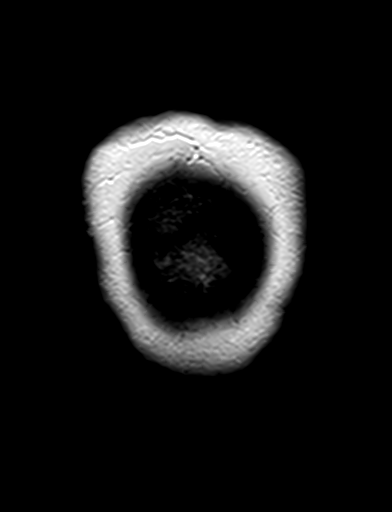

[Series 16: T1 post-contrast · sagittal · 5.0mm · 0.45mm/px · 1 of 23 slices shown]
[im 1/23]
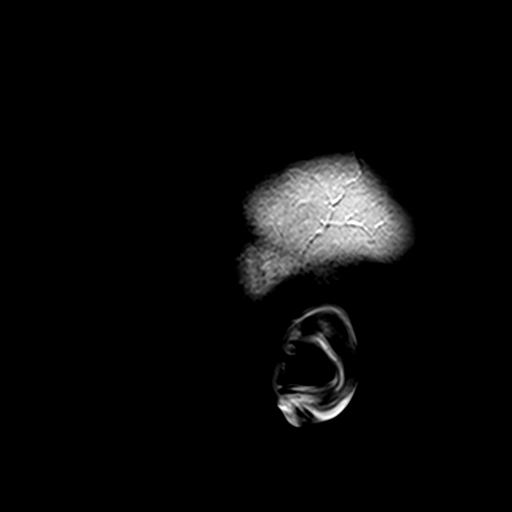

[48 of 48 positions shown; findings below may reference images not displayed]

FINDINGS: Brain: Within the left superior cerebellum near midline there is a
small round and enhancing 7-8 mm lesion which has associated dark
T1/T2, and FLAIR signal, in part suggesting small vascular flow
voids, and an associated enhancing vessel which extends cephalad
toward the tentorial venous structures. Enhancement of the lesion
itself suggests a combination of slow and high-flow (series 16,
image 13).

There was subtle evidence of this lesion on the [PE] CTA. No
calcification of the lesion at that time. Subtle evidence of the
lesion on the [PE] cervical MRI (series 4, image 8 of that exam)
with enlargement since that time on the CALEB cervical exam (series
3, image 12).

There is no regional edema or significant mass effect. The lesion
demonstrates susceptibility, and there is trace internal T2
heterogeneity (series 6, image 8), although no well delineated
hemosiderin ring. No discrete cystic component. No adjacent dural
thickening. Diffusion is facilitated.

Chronic partially empty sella.

No restricted diffusion to suggest acute infarction. No midline
shift, mass effect, evidence of mass lesion, ventriculomegaly,
extra-axial collection or acute intracranial hemorrhage.
Cervicomedullary junction is within normal limits.

Outside of the left superior cerebellum gray and white matter signal
is within normal limits throughout the brain. No other hemosiderin
or chronic cerebral blood products. No cortical encephalomalacia. No
other abnormal intracranial enhancement. No dural thickening.

Vascular: Major intracranial vascular flow voids are preserved. The
major dural venous sinuses are enhancing and appear to be patent.
There is an effaced appearance of the junction of the transverse and
sigmoid sinuses.

Skull and upper cervical spine: Negative visible cervical spine.
Visualized bone marrow signal is within normal limits.

Sinuses/Orbits: Mildly increased CSF in the bilateral optic nerve
root sleeves. No papilledema is evident. Paranasal sinuses and
mastoids are stable and well aerated.

Other: Visible internal auditory structures appear normal. Negative
visible scalp and face.
IMPRESSION: 1. Small roughly 8 mm mixed nodular and serpiginous enhancing lesion
of the left superior cerebellum is highly vascular, with evidence of
both fast and slow flow components.
Subtle evidence of this in [PE], with enlargement since that time.
No significant mass effect or edema.
Differential considerations include acquired vascular AVM or dural
venous fistula. Small cerebellar Hemangioblastoma is also possible.
Cavernous venous malformation with small DVA were also considered
but less likely. And meningioma is felt highly unlikely.
Conventional cerebral angiogram might be the next best step in
evaluation.

2. Chronic partially empty sella, and other features raising the
possibility of idiopathic intracranial hypertension (pseudotumor
cerebri).

## 2021-05-19 MED ORDER — GADOBENATE DIMEGLUMINE 529 MG/ML IV SOLN
20.0000 mL | Freq: Once | INTRAVENOUS | Status: AC | PRN
  Administered 2021-05-19: 20 mL via INTRAVENOUS

## 2021-05-24 ENCOUNTER — Telehealth: Payer: Self-pay

## 2021-05-24 DIAGNOSIS — R29810 Facial weakness: Secondary | ICD-10-CM

## 2021-05-24 DIAGNOSIS — R200 Anesthesia of skin: Secondary | ICD-10-CM

## 2021-05-24 NOTE — Telephone Encounter (Signed)
-----   Message from Pieter Partridge, DO sent at 05/20/2021  1:34 PM EDT ----- I called and spoke with Ms. Sachdeva regarding the MRI results.  No evidence of MS or anything else that would be a cause for the facial numbness/weakness.  I did explain that there was an incidental finding of a vascular anatomic variant that I would like further evaluated with a cerebral angiogram.  I would like to refer her to Dr. Luanne Bras for further evaluation.  She is in agreement of the plan.  I answered all questions to the best of my ability.

## 2021-05-24 NOTE — Telephone Encounter (Signed)
I called patient and notified her that her forms have been completed and faxed. I have placed a copy in the mail per pt request. Virginia Douglas

## 2021-05-24 NOTE — Telephone Encounter (Signed)
Referral added

## 2021-05-25 ENCOUNTER — Telehealth (HOSPITAL_COMMUNITY): Payer: Self-pay

## 2021-05-25 NOTE — Telephone Encounter (Signed)
Called to schedule consult with Dr. Estanislado Pandy, no answer, vm full. AW

## 2021-05-26 ENCOUNTER — Other Ambulatory Visit: Payer: Self-pay | Admitting: Nurse Practitioner

## 2021-05-26 ENCOUNTER — Other Ambulatory Visit (HOSPITAL_COMMUNITY): Payer: Self-pay | Admitting: Interventional Radiology

## 2021-05-26 DIAGNOSIS — R29810 Facial weakness: Secondary | ICD-10-CM

## 2021-05-26 DIAGNOSIS — R200 Anesthesia of skin: Secondary | ICD-10-CM

## 2021-05-26 DIAGNOSIS — F32A Depression, unspecified: Secondary | ICD-10-CM

## 2021-05-30 ENCOUNTER — Other Ambulatory Visit: Payer: Self-pay | Admitting: Radiology

## 2021-05-30 ENCOUNTER — Encounter: Payer: Self-pay | Admitting: Nurse Practitioner

## 2021-05-30 ENCOUNTER — Telehealth: Payer: Self-pay

## 2021-05-30 NOTE — Telephone Encounter (Signed)
I left the pt a message that I was giving her a call, the pt sent a mychart message requesting a call.

## 2021-05-31 ENCOUNTER — Other Ambulatory Visit: Payer: Self-pay

## 2021-05-31 ENCOUNTER — Other Ambulatory Visit (HOSPITAL_COMMUNITY): Payer: Self-pay | Admitting: Interventional Radiology

## 2021-05-31 ENCOUNTER — Encounter (HOSPITAL_COMMUNITY): Payer: Self-pay

## 2021-05-31 ENCOUNTER — Ambulatory Visit (HOSPITAL_COMMUNITY)
Admission: RE | Admit: 2021-05-31 | Discharge: 2021-05-31 | Disposition: A | Discharge status: Home / Self Care | Payer: No Typology Code available for payment source | Source: Ambulatory Visit | Attending: Interventional Radiology | Admitting: Interventional Radiology

## 2021-05-31 DIAGNOSIS — R200 Anesthesia of skin: Secondary | ICD-10-CM | POA: Insufficient documentation

## 2021-05-31 DIAGNOSIS — R29810 Facial weakness: Secondary | ICD-10-CM

## 2021-05-31 DIAGNOSIS — Z8249 Family history of ischemic heart disease and other diseases of the circulatory system: Secondary | ICD-10-CM | POA: Diagnosis not present

## 2021-05-31 DIAGNOSIS — D649 Anemia, unspecified: Secondary | ICD-10-CM | POA: Insufficient documentation

## 2021-05-31 DIAGNOSIS — Q282 Arteriovenous malformation of cerebral vessels: Secondary | ICD-10-CM | POA: Insufficient documentation

## 2021-05-31 DIAGNOSIS — Q2730 Arteriovenous malformation, site unspecified: Secondary | ICD-10-CM | POA: Diagnosis not present

## 2021-05-31 DIAGNOSIS — Z794 Long term (current) use of insulin: Secondary | ICD-10-CM | POA: Insufficient documentation

## 2021-05-31 DIAGNOSIS — E119 Type 2 diabetes mellitus without complications: Secondary | ICD-10-CM | POA: Insufficient documentation

## 2021-05-31 DIAGNOSIS — I10 Essential (primary) hypertension: Secondary | ICD-10-CM | POA: Diagnosis not present

## 2021-05-31 DIAGNOSIS — R600 Localized edema: Secondary | ICD-10-CM | POA: Diagnosis not present

## 2021-05-31 DIAGNOSIS — Z87891 Personal history of nicotine dependence: Secondary | ICD-10-CM | POA: Insufficient documentation

## 2021-05-31 DIAGNOSIS — Z79899 Other long term (current) drug therapy: Secondary | ICD-10-CM | POA: Insufficient documentation

## 2021-05-31 HISTORY — PX: IR ANGIO VERTEBRAL SEL VERTEBRAL UNI R MOD SED: IMG5368

## 2021-05-31 HISTORY — PX: IR ANGIO INTRA EXTRACRAN SEL COM CAROTID INNOMINATE BILAT MOD SED: IMG5360

## 2021-05-31 HISTORY — PX: IR ANGIO EXTRACRAN SEL COM CAROTID INNOMINATE UNI R MOD SED: IMG5356

## 2021-05-31 HISTORY — PX: IR US GUIDE VASC ACCESS RIGHT: IMG2390

## 2021-05-31 LAB — BASIC METABOLIC PANEL
Anion gap: 9 (ref 5–15)
BUN: 6 mg/dL (ref 6–20)
CO2: 25 mmol/L (ref 22–32)
Calcium: 9.6 mg/dL (ref 8.9–10.3)
Chloride: 104 mmol/L (ref 98–111)
Creatinine, Ser: 0.64 mg/dL (ref 0.44–1.00)
GFR, Estimated: >60 mL/min (ref >60)
Glucose, Bld: 96 mg/dL (ref 70–99)
Potassium: 3.6 mmol/L (ref 3.5–5.1)
Sodium: 138 mmol/L (ref 135–145)

## 2021-05-31 LAB — CBC
HCT: 38.9 % (ref 36.0–46.0)
Hemoglobin: 12.7 g/dL (ref 12.0–15.0)
MCH: 27.7 pg (ref 26.0–34.0)
MCHC: 32.6 g/dL (ref 30.0–36.0)
MCV: 84.9 fL (ref 80.0–100.0)
Platelets: 323 10*3/uL (ref 150–400)
RBC: 4.58 MIL/uL (ref 3.87–5.11)
RDW: 15.1 % (ref 11.5–15.5)
WBC: 14.2 10*3/uL — ABNORMAL HIGH (ref 4.0–10.5)
nRBC: 0 % (ref 0.0–0.2)

## 2021-05-31 LAB — PROTIME-INR
INR: 1 (ref 0.8–1.2)
Prothrombin Time: 13.5 seconds (ref 11.4–15.2)

## 2021-05-31 LAB — GLUCOSE, CAPILLARY: Glucose-Capillary: 91 mg/dL (ref 70–99)

## 2021-05-31 LAB — APTT: aPTT: 32 seconds (ref 24–36)

## 2021-05-31 IMAGING — XA IR CAROTID CERVICAL UNILAT RIGHT (MS)
1 series · 12 of 24 positions shown · IV contrast (IODINE)
Comparison: MRI of brain [DATE], and CT angiogram of
the head and neck [DATE].

CLINICAL DATA: History of chronic headaches. History of bilateral
pulsatile tinnitus right greater than left, history of idiopathic
benign intracranial hypertension and recently discovered vascular
abnormality involving the left cerebellar hemisphere superiorly.

EXAM:
IR ANGIO EXTRACRAN SELECT COM CAROTID INNOMINATE UNI*R* MOD SED
TECHNIQUE: Informed written consent was obtained from the patient after a
thorough discussion of the procedural risks, benefits and
alternatives. All questions were addressed. Maximal Sterile Barrier
Technique was utilized including caps, mask, sterile gowns, sterile
gloves, sterile drape, hand hygiene and skin antiseptic. A timeout
was performed prior to the initiation of the procedure.

[Series 300: dr. (person_name) · 12 of 414 slices shown]
[im 18/414]
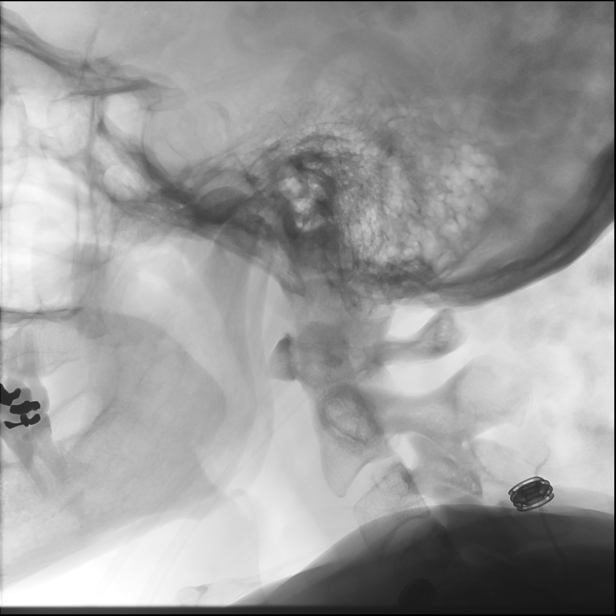
[im 54/414]
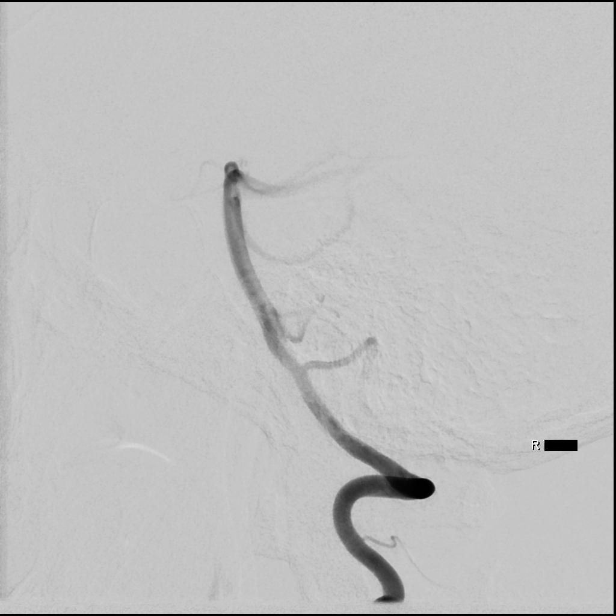
[im 90/414]
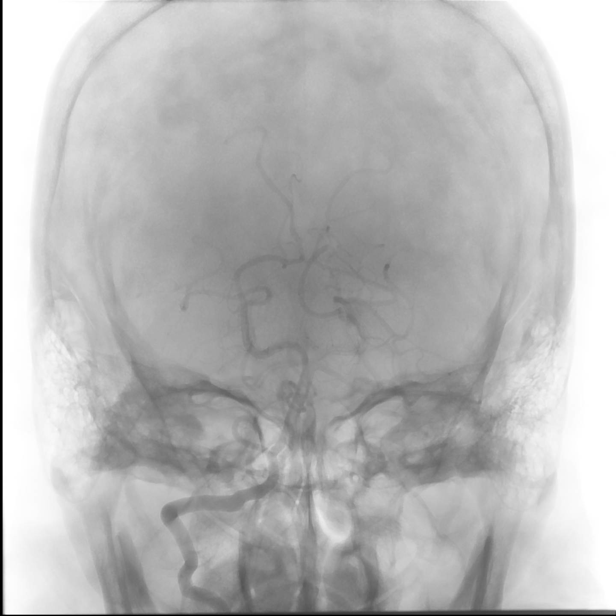
[im 126/414]
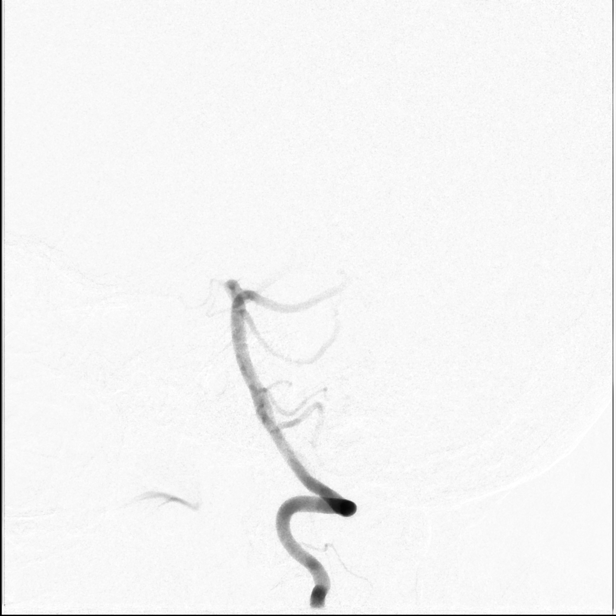
[im 162/414]
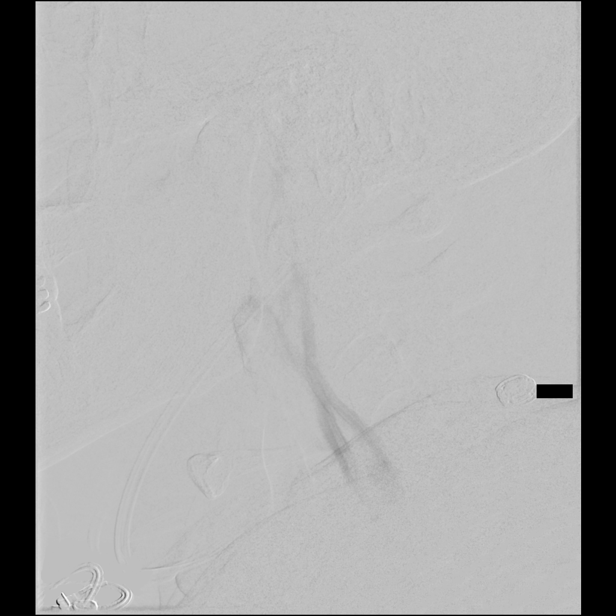
[im 198/414]
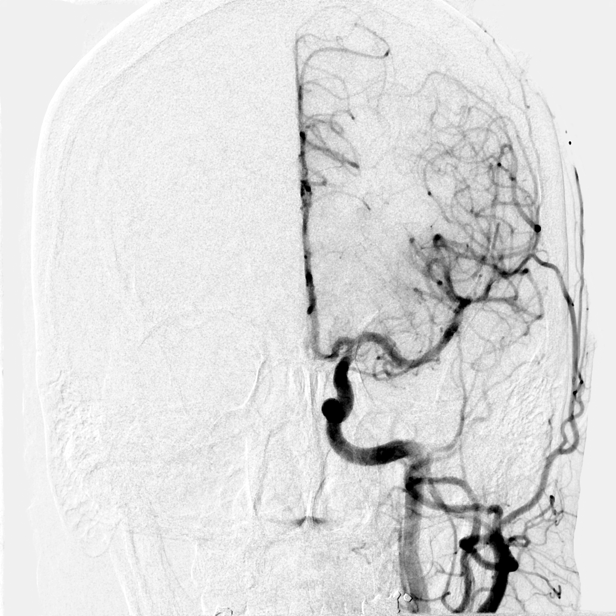
[im 234/414]
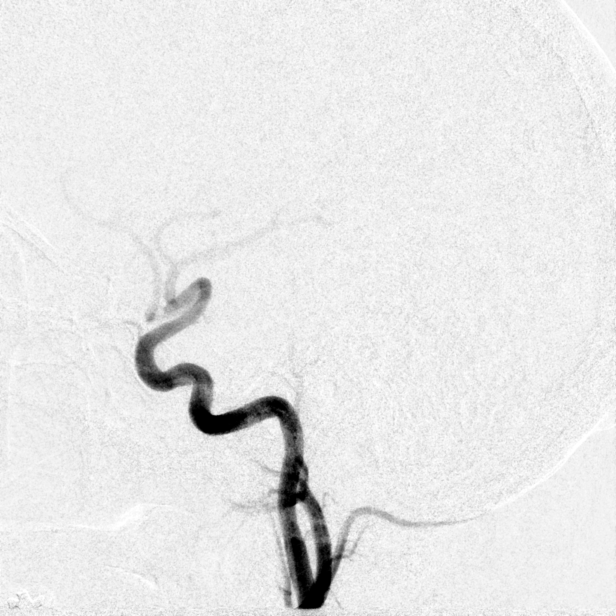
[im 270/414]
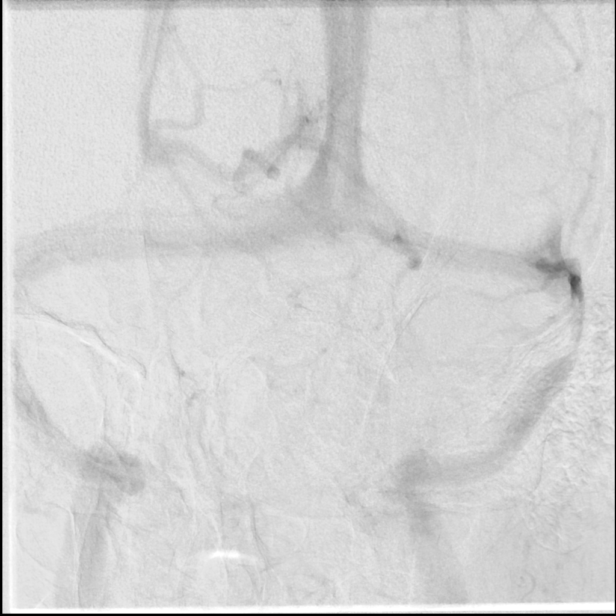
[im 306/414]
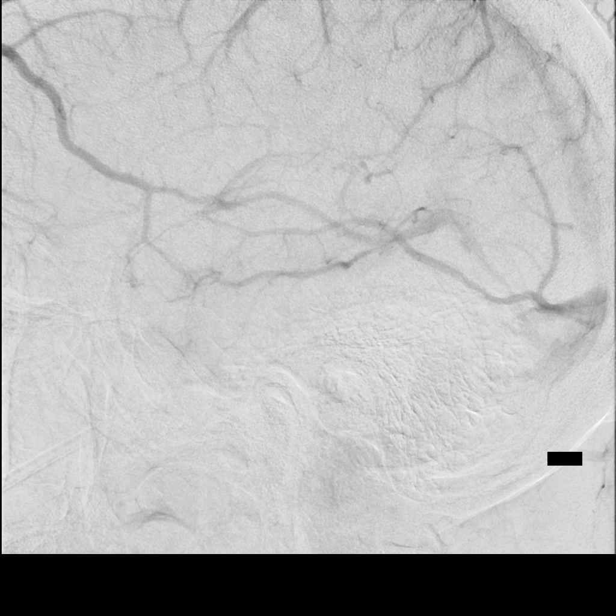
[im 342/414]
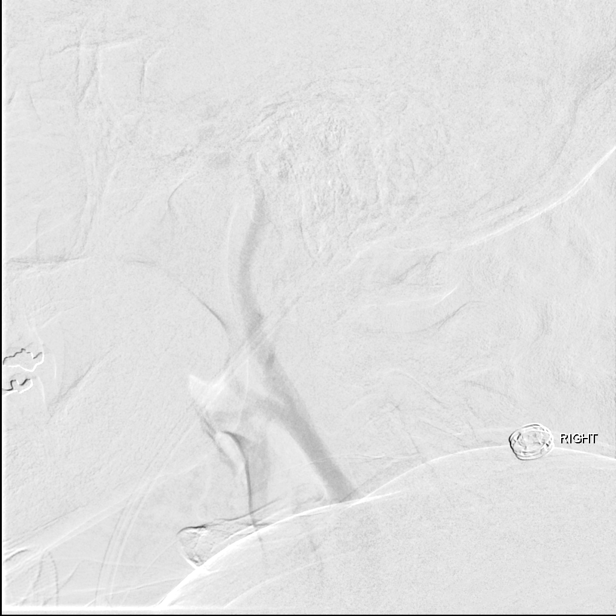
[im 378/414]
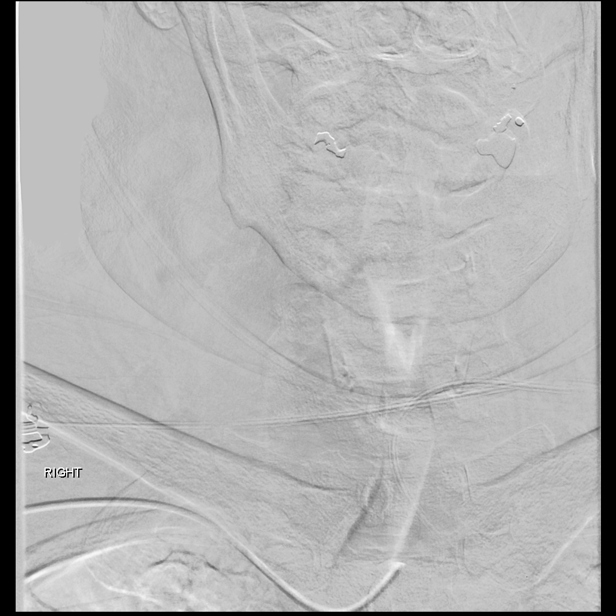
[im 414/414]
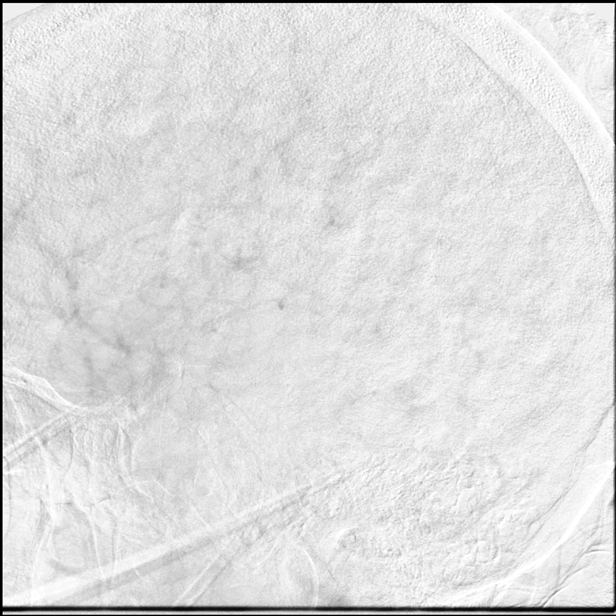

[12 of 24 positions shown; findings below may reference images not displayed]

MEDICATIONS:
Heparin [VV] units IA. No antibiotic was administered within 1 hour
of the procedure.

ANESTHESIA/SEDATION:
Versed 1 mg IV; Fentanyl 50 mcg IV

Moderate Sedation Time:  66 minutes

The patient was continuously monitored during the procedure by the
interventional radiology nurse under my direct supervision.

CONTRAST:  60 mL Omnipaque 240, 30 mL Omnipaque 350.

FLUOROSCOPY TIME:  Fluoroscopy Time: 23 minutes 30 seconds ([VV]
mGy).

COMPLICATIONS:
None immediate.
The right forearm to the wrist was prepped and draped in the usual
sterile manner. The right radial artery was identified with
ultrasound, and its morphology documented. A dorsal palmar
anastomosis was verified to be present. Using ultrasound guidance
radial access was obtained using a micropuncture set via an 018 inch
micro guidewire. A [DATE] French radial sheath was then introduced
without event. The obturator, and the guidewire were removed. Good
aspiration obtained from the side port of the sheath. A cocktail of
[VV] units of heparin, 2.5 mg of verapamil, and 200 mcg of
nitroglycerin was then infused in diluted form without event. A
right radial arteriogram was performed.

Over a 0.035 inch standard guidewire, a 5 AUJLA 2
diagnostic catheter was advanced to the aortic arch region and
selectively positioned in the right vertebral artery, the left
common carotid artery, and the innominate artery. Following the
procedure hemostasis was obtained using a wrist band at the right
radial puncture site. Distal right radial pulse was verified to be
present.
FINDINGS: The right vertebral artery origin is widely patent.

The vessel is seen to opacify to the cranial skull base. Wide
patency is seen of the right vertebrobasilar junction and the right
posterior-inferior cerebellar artery.

The posterior cerebral arteries, the superior cerebellar arteries
and the anterior-inferior cerebellar arteries opacify into the
capillary and venous phases. Transient retrograde opacification of
the left vertebrobasilar junction to the left posterior-inferior
cerebellar artery, is noted from the right vertebral artery
injection.

Also demonstrated is the presence of an approximately 3.4 mm x
mm nidus being supplied by the left posterior cerebral artery in the
proximal P3 segment. This is associated with early opacification of
a dilated lobulated vein measuring approximately 11.1 mm in length
and 4.7 mm in a lobulated segment. Egress is seen into the adjacent
superior cerebellar vein.

The venous phase demonstrates significant attenuation of the
proximal left sigmoid sinus, at the transverse sinus sigmoid sinus
junction.

The left common carotid arteriogram demonstrates the left external
carotid artery and its major branches to be widely patent.

The left internal carotid artery at the bulb to the cranial skull
base is widely patent as well.

The petrous, the cavernous and the supraclinoid segments are intact.

The left middle cerebral artery and the left anterior cerebral
artery opacify into the capillary and venous phases. Venous phase
again demonstrates the high-grade stenosis of the proximal left
sigmoid sinus and of a moderate to severe stenosis of the proximal
right sigmoid sinus.

The innominate arteriogram demonstrates patency of the innominate
artery, and proximal right subclavian artery.

The right common carotid artery origin is widely patent.

The right external carotid artery origin and its visualized branches
are also widely patent.

The right internal carotid artery at the bulb to the cranial skull
base is noted to be patent.

The visualized petrous, cavernous and supraclinoid segments and the
proximal right middle cerebral artery is widely patent on the
lateral projection. The visualized branches of the right middle
cerebral artery demonstrate gross patency.
IMPRESSION: A high-flow arteriovenous malformation involving the left posterior
cerebral artery in the P3 segment, with the nidus measuring the
mm x 4.3 mm with an early lobulated dilated vein measuring
proximally 11.1 mm in length and 4.7 mm in width.

High-grade stenosis of the left sigmoid sinus proximally, and
moderate to severe stenosis of the right sigmoid sinus proximally.

PLAN:
Findings reviewed with the patient.

Patient to return to the clinic in the next few days for discussion
regarding the above angiographic findings.

## 2021-05-31 MED ORDER — LIDOCAINE HCL 1 % IJ SOLN
INTRAMUSCULAR | Status: AC
  Filled 2021-05-31: qty 20

## 2021-05-31 MED ORDER — IOHEXOL 240 MG/ML SOLN: 50.0000 mL | Freq: Once | INTRAMUSCULAR | Status: DC | PRN

## 2021-05-31 MED ORDER — VERAPAMIL HCL 2.5 MG/ML IV SOLN: INTRA_ARTERIAL | Status: DC | PRN

## 2021-05-31 MED ORDER — ACETAMINOPHEN 325 MG PO TABS
650.0000 mg | ORAL_TABLET | Freq: Four times a day (QID) | ORAL | Status: DC | PRN
  Filled 2021-05-31: qty 2

## 2021-05-31 MED ORDER — FENTANYL CITRATE (PF) 100 MCG/2ML IJ SOLN
INTRAMUSCULAR | Status: DC | PRN
  Administered 2021-05-31 (×2): 25 ug via INTRAVENOUS

## 2021-05-31 MED ORDER — LIDOCAINE HCL 1 % IJ SOLN
INTRAMUSCULAR | Status: DC | PRN
  Administered 2021-05-31: 10 mL via INTRADERMAL

## 2021-05-31 MED ORDER — FENTANYL CITRATE (PF) 100 MCG/2ML IJ SOLN
INTRAMUSCULAR | Status: AC
  Filled 2021-05-31: qty 4

## 2021-05-31 MED ORDER — MIDAZOLAM HCL 2 MG/2ML IJ SOLN
INTRAMUSCULAR | Status: DC | PRN
Start: 2021-05-31 — End: 2021-06-01
  Administered 2021-05-31: 1 mg via INTRAVENOUS

## 2021-05-31 MED ORDER — IOHEXOL 350 MG/ML SOLN: 100.0000 mL | Freq: Once | INTRAVENOUS | Status: DC | PRN

## 2021-05-31 MED ORDER — VERAPAMIL HCL 2.5 MG/ML IV SOLN
INTRAVENOUS | Status: AC
  Filled 2021-05-31: qty 2

## 2021-05-31 MED ORDER — SODIUM CHLORIDE 0.9 % IV SOLN: INTRAVENOUS | Status: DC

## 2021-05-31 MED ORDER — HEPARIN SODIUM (PORCINE) 1000 UNIT/ML IJ SOLN
INTRAMUSCULAR | Status: AC
  Filled 2021-05-31: qty 1

## 2021-05-31 MED ORDER — NITROGLYCERIN 1 MG/10 ML FOR IR/CATH LAB
INTRA_ARTERIAL | Status: DC | PRN
  Administered 2021-05-31 (×2): 200 ug via INTRA_ARTERIAL

## 2021-05-31 MED ORDER — MIDAZOLAM HCL 2 MG/2ML IJ SOLN
INTRAMUSCULAR | Status: AC
  Filled 2021-05-31: qty 4

## 2021-05-31 MED ORDER — NITROGLYCERIN 1 MG/10 ML FOR IR/CATH LAB
INTRA_ARTERIAL | Status: AC
  Filled 2021-05-31: qty 10

## 2021-05-31 MED ORDER — HEPARIN SODIUM (PORCINE) 1000 UNIT/ML IJ SOLN
INTRAMUSCULAR | Status: DC | PRN
  Administered 2021-05-31: 2000 [IU] via INTRAVENOUS

## 2021-05-31 MED ORDER — ACETAMINOPHEN 325 MG PO TABS
ORAL_TABLET | ORAL | Status: AC
  Administered 2021-05-31: 650 mg via ORAL
  Filled 2021-05-31: qty 2

## 2021-05-31 MED ORDER — SODIUM CHLORIDE 0.9 % IV SOLN: INTRAVENOUS | Status: AC

## 2021-05-31 NOTE — Progress Notes (Signed)
On arrival pt c/o right arm pain. Valene Bors RN stated it was hurting and he gave additional IV fentanyl, VSS, 2+ right radial pulse no edema in forearm, hand is warm, no signs of swelling. Gave tylenol after getting order from Dolliver PA IR / will monitor

## 2021-05-31 NOTE — Sedation Documentation (Signed)
Doctor notified patient has pain headache 10/10 achy and right arm 10/10 achy.

## 2021-05-31 NOTE — Sedation Documentation (Signed)
Nurse transported patient to short stay 10 TRband intact radial pulse +2.

## 2021-05-31 NOTE — Procedures (Signed)
S/P RT VA ,LT CCA and inniminate artrriograms. RT rad approach. Findings. 1.approx 3.4 mm x 4.3 mm nidus of a fast flow AVM arising fronm the Lt PCA P2-P3 seg ,associated with a 11.74mm x 4.63mm lobulated venous dilatation. 2.high grade Lt SS and  mod RT SS stenosis. S.Regena Delucchi MD

## 2021-05-31 NOTE — Sedation Documentation (Signed)
Doctor at bedside speaking with patient.

## 2021-05-31 NOTE — H&P (Addendum)
Chief Complaint: Patient was seen in consultation today for image guided diagnostic cerebral angiogram with moderate sedation at the request of Deveshwar,Sanjeev  Referring Physician(s): Deveshwar,Sanjeev  Supervising Physician: Luanne Bras  Patient Status: [facility] - Out-pt  History of Present Illness: Virginia Douglas is a 45 y.o. female with PMH of anemia, DM type II, edema, HTN, migraines. Patient had MRI 05/19/2021 that noted left superior cerebellar lesion with enlargement since 2017. Study recommend cerebral angiogram for evaluation.  Patient is here today for an image guided diagnostic cerebral angiogram for increased intracranial hypertension with facial weakness and numbness at the request of Dr. Arlean Hopping.  MRI 05/19/21: IMPRESSION: 1. Small roughly 8 mm mixed nodular and serpiginous enhancing lesion of the left superior cerebellum is highly vascular, with evidence of both fast and slow flow components. Subtle evidence of this in 2017, with enlargement since that time. No significant mass effect or edema. Differential considerations include acquired vascular AVM or dural venous fistula. Small cerebellar Hemangioblastoma is also possible. Cavernous venous malformation with small DVA were also considered but less likely. And meningioma is felt highly unlikely. Conventional cerebral angiogram might be the next best step in evaluation.   2. Chronic partially empty sella, and other features raising the possibility of idiopathic intracranial hypertension (pseudotumor cerebri).  Past Medical History:  Diagnosis Date   Anemia    iron deficiency   Diabetes ([place])    Edema    Migraine headache     Past Surgical History:  Procedure Laterality Date   ABDOMINAL HYSTERECTOMY     CHOLECYSTECTOMY     COLONOSCOPY  12/28/2020   ESOPHAGEAL DILATION     OVARIAN CYST REMOVAL      Allergies: Patient has no known allergies.  Medications: Prior to Admission  medications   Medication Sig Start Date End Date Taking? Authorizing Provider  acetaZOLAMIDE (DIAMOX) 250 MG tablet TAKE 1 TABLET BY MOUTH TWICE A DAY 03/23/21   Jaffe, Adam R, DO  ADDERALL XR 30 MG 24 hr capsule Take 30 mg by mouth daily. 09/29/20   [provider]  atorvastatin (LIPITOR) 10 MG tablet TAKE 1 TABLET BY MOUTH EVERY DAY 04/26/21   [name], FNP  Blood Glucose Monitoring Suppl (ONETOUCH VERIO IQ SYSTEM) w/Device KIT by Does not apply route. Use to check blood sugars twice    [provider]  buPROPion (WELLBUTRIN XL) 150 MG 24 hr tablet TAKE 1 TABLET BY MOUTH EVERY DAY IN THE MORNING 05/26/21   [name], FNP  clonazePAM (KLONOPIN) 0.5 MG tablet TAKE 1 TABLET BY MOUTH THREE TIMES A DAY AS NEEDED FOR ANXIETY 03/17/20   [name], FNP  DULoxetine (CYMBALTA) 60 MG capsule TAKE 1 CAPSULE BY MOUTH EVERY DAY 02/02/21   Tomi Likens, Adam R, DO  eletriptan (RELPAX) 40 MG tablet TAKE 1 TABLET AS NEEDED FOR MIGRAINE/ HEADACHE. MAY REPEAT IN 2 HRS IF HEADACHE PERSISTS OR RECURS. 09/22/20   Pieter Partridge, DO  famotidine (PEPCID) 20 MG tablet Take 1 tablet (20 mg total) by mouth 2 (two) times daily. 10/12/20   Khatri, Hina, PA-C  Fremanezumab-vfrm (AJOVY) 225 MG/1.5ML SOAJ Inject 225 mg into the skin every 28 (twenty-eight) days. 01/28/21   Tomi Likens, Adam R, DO  glucose blood test strip 1 each by Other route as needed for other. Insert 1 by subcutaneous route 2 times every day check blood sugar before breakfast and dinner    [provider]  HYDROcodone-acetaminophen (NORCO) 10-325 MG tablet Take 1 tablet by mouth  as needed. 09/17/20   [provider]  hydrOXYzine (VISTARIL) 50 MG capsule Take 1-2 capsules by mouth as needed for sleep at bedtime 04/21/21   [name], FNP  Insulin Pen Needle (PEN NEEDLES) 32G X 4 MM MISC 1 each by Does not apply route once a week. 11/11/18   [name], FNP  Lasmiditan Succinate (REYVOW) 100 MG TABS Take 1 tablet by mouth daily as  needed. 12/31/19   Pieter Partridge, DO  lisinopril-hydrochlorothiazide (ZESTORETIC) 10-12.5 MG tablet TAKE 1 TABLET BY MOUTH EVERY DAY 04/25/21   Glendale Chard, MD  Lita Mains (ZYLET OP) Apply to eye.    [provider]  phentermine 15 MG capsule Take 1 capsule (15 mg total) by mouth every morning. 04/21/21   [name], FNP  Semaglutide, 1 MG/DOSE, (OZEMPIC, 1 MG/DOSE,) 4 MG/3ML SOPN Inject 1 mg into the skin once a week. 05/05/21   [name], FNP  topiramate (TOPAMAX) 50 MG tablet TAKE 2 AND 1/2 TABLETS (125 MG TOTAL) BY MOUTH AT BEDTIME. 04/07/21   Tomi Likens, Adam R, DO  TURMERIC PO Take 1 capsule by mouth daily.    [provider]  Vitamin D, Ergocalciferol, (DRISDOL) 1.25 MG (50000 UNIT) CAPS capsule TAKE 1 CAPSULE (50,000 UNITS TOTAL) BY MOUTH EVERY 7 (SEVEN) DAYS 04/04/21   [name], FNP  metFORMIN (GLUCOPHAGE-XR) 500 MG 24 hr tablet Take 2 tablets (1,000 mg total) by mouth 2 (two) times daily. Patient not taking: Reported on 06/05/2019 03/10/16 08/15/19  [name], MD  Phentermine-Topiramate 11.25-69 MG CP24 Take 1 tablet by mouth daily. 02/04/19 04/07/19  [name], FNP     Family History  Problem Relation Age of Onset   Diabetes Mother    Hypertension Mother    Diabetes Father    Hypertension Father    Kidney failure Father    Hypertension Brother    Diabetes Maternal Grandmother    Pancreatic cancer Maternal Grandmother    Diabetes Paternal Grandmother    Breast cancer Paternal Grandmother    Stroke Paternal Grandfather    Cancer Maternal Aunt        breast   Breast cancer Maternal Aunt    Healthy Son    Healthy Son     Social History   Socioeconomic History   Marital status: Legally Separated    Spouse name: Jeneen Rinks   Number of children: 2   Years of education: 16   Highest education level: Some college, no degree  Occupational History   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[employer]  Tobacco Use   Smoking  status: Former    Packs/day: 0.50    Years: 25.00    Pack years: 12.50    Types: Cigarettes    Start date: 03/24/2019   Smokeless tobacco: Never  Vaping Use   Vaping Use: Never used  Substance and Sexual Activity   Alcohol use: No    Alcohol/week: 0.0 standard drinks   Drug use: No   Sexual activity: Yes    Birth control/protection: Surgical  Other Topics Concern   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 She lives with her 2 children in a one story house. She drinks one large cup of coffee a day and an occasional tea  or soda. She walks daily.      One story home      Social Determinants of Health   Financial Resource Strain: Not on file  Food Insecurity: Not on file  Transportation Needs: Not on file  Physical Activity: Not on file  Stress: Not on file  Social Connections: Not on file     Review of Systems: A 12 point ROS discussed and pertinent positives are indicated in the HPI above.  All other systems are negative.  Review of Systems  Constitutional:  Positive for chills. Negative for appetite change and fever.       Patient reports chills and hot flashes.  States she feels she may be going through menopause.  Respiratory:  Negative for cough and shortness of breath.   Cardiovascular:  Negative for chest pain and leg swelling.  Gastrointestinal:  Positive for nausea and vomiting. Negative for abdominal pain, blood in stool and diarrhea.       Patient reports intermittent nausea with vomiting.  Patient states she has had CT and seeing GI for this issue.   Vital Signs: BP (!) 138/92 (BP Location: Right Arm)   Pulse 89   Temp 98 F (36.7 C) (Oral)   Ht 5' 6"  (1.676 m)   Wt 228 lb (103.4 kg)   SpO2 99%   BMI 36.80 kg/m   Physical Exam Constitutional:       Appearance: Normal appearance. She is not ill-appearing.  HENT:     Head: Normocephalic and atraumatic.     Mouth/Throat:     Mouth: Mucous membranes are moist.     Pharynx: Oropharynx is clear.  Eyes:     Pupils: Pupils are equal, round, and reactive to light.  Cardiovascular:     Rate and Rhythm: Normal rate and regular rhythm.     Pulses: Normal pulses.     Heart sounds: Normal heart sounds. No murmur heard.   No gallop.  Pulmonary:     Effort: No respiratory distress.     Breath sounds: Normal breath sounds. No stridor. No wheezing, rhonchi or rales.  Abdominal:     General: There is no distension.     Palpations: Abdomen is soft.     Tenderness: There is no abdominal tenderness. There is no guarding.  Musculoskeletal:     Cervical back: Normal range of motion.     Right lower leg: No edema.     Left lower leg: No edema.  Skin:    General: Skin is warm and dry.  Neurological:     Mental Status: She is alert and oriented to person, place, and time.     Sensory: Sensory deficit present.     Motor: Motor function is intact. No weakness, tremor or pronator drift.     Comments: Patient reports decreased sensation to left side of face and forehead  Psychiatric:        Mood and Affect: Mood normal.        Behavior: Behavior normal.        Thought Content: Thought content normal.        Judgment: Judgment normal.    Imaging: MR BRAIN W WO CONTRAST  Result Date: 05/20/2021 CLINICAL DATA:  45 year old female with headaches, brain fog, right side facial weakness and numbness. EXAM: MRI HEAD WITHOUT AND WITH CONTRAST TECHNIQUE: Multiplanar, multiecho pulse sequences of the brain and surrounding structures were obtained without and with intravenous contrast. CONTRAST:  12m MULTIHANCE GADOBENATE DIMEGLUMINE 529 MG/ML IV  SOLN COMPARISON:  Cervical spine MRI 03/20/2021. CTA head and neck 02/10/2018. Outside cervical spine MRI 09/22/2015. Head CT 03/11/2013. FINDINGS: Brain: Within the  left superior cerebellum near midline there is a small round and enhancing 7-8 mm lesion which has associated dark T1/T2, and FLAIR signal, in part suggesting small vascular flow voids, and an associated enhancing vessel which extends cephalad toward the tentorial venous structures. Enhancement of the lesion itself suggests a combination of slow and high-flow (series 16, image 13). There was subtle evidence of this lesion on the 2019 CTA. No calcification of the lesion at that time. Subtle evidence of the lesion on the 2017 cervical MRI (series 4, image 8 of that exam) with enlargement since that time on the July cervical exam (series 3, image 12). There is no regional edema or significant mass effect. The lesion demonstrates susceptibility, and there is trace internal T2 heterogeneity (series 6, image 8), although no well delineated hemosiderin ring. No discrete cystic component. No adjacent dural thickening. Diffusion is facilitated. Chronic partially empty sella. No restricted diffusion to suggest acute infarction. No midline shift, mass effect, evidence of mass lesion, ventriculomegaly, extra-axial collection or acute intracranial hemorrhage. Cervicomedullary junction is within normal limits. Outside of the left superior cerebellum gray and white matter signal is within normal limits throughout the brain. No other hemosiderin or chronic cerebral blood products. No cortical encephalomalacia. No other abnormal intracranial enhancement. No dural thickening. Vascular: Major intracranial vascular flow voids are preserved. The major dural venous sinuses are enhancing and appear to be patent. There is an effaced appearance of the junction of the transverse and sigmoid sinuses. Skull and upper cervical spine: Negative visible cervical spine. Visualized bone marrow signal is within normal limits. Sinuses/Orbits: Mildly increased CSF in the bilateral optic nerve root sleeves. No papilledema is evident. Paranasal sinuses  and mastoids are stable and well aerated. Other: Visible internal auditory structures appear normal. Negative visible scalp and face. IMPRESSION: 1. Small roughly 8 mm mixed nodular and serpiginous enhancing lesion of the left superior cerebellum is highly vascular, with evidence of both fast and slow flow components. Subtle evidence of this in 2017, with enlargement since that time. No significant mass effect or edema. Differential considerations include acquired vascular AVM or dural venous fistula. Small cerebellar Hemangioblastoma is also possible. Cavernous venous malformation with small DVA were also considered but less likely. And meningioma is felt highly unlikely. Conventional cerebral angiogram might be the next best step in evaluation. 2. Chronic partially empty sella, and other features raising the possibility of idiopathic intracranial hypertension (pseudotumor cerebri). Electronically Signed   By: Genevie Ann M.D.   On: 05/20/2021 09:51    Labs:  CBC: Recent Labs    07/15/20 1052 10/12/20 0633  WBC 13.6* 9.6  HGB 14.2 13.8  HCT 42.7 41.8  PLT 299 336    COAGS: No results for input(s): INR, APTT in the last 8760 hours.  BMP: Recent Labs    07/15/20 1052 10/12/20 0633 04/21/21 0947  NA 137 137 139  K 3.6 3.3* CANCELED  CL 101 104 102  CO2 17* 19* 19*  GLUCOSE 97 106* 90  BUN 9 8 8   CALCIUM 10.2 9.8 9.6  CREATININE 0.91 0.65 0.76  GFRNONAA 78 >60  --   GFRAA 89  --   --     LIVER FUNCTION TESTS: Recent Labs    07/15/20 1052 10/12/20 0633 12/06/20 0921  BILITOT 0.4 0.4  --   AST 20 21  --  ALT 22 18  --   ALKPHOS 115 107  --   PROT 7.6 7.9 6.5  ALBUMIN 4.9* 4.4  --     TUMOR MARKERS: No results for input(s): AFPTM, CEA, CA199, CHROMGRNA in the last 8760 hours.  Assessment and Plan:  History of anemia, DM type II, edema, HTN, migraines. Patient had MRI 05/19/2021 that noted left superior cerebellar lesion with enlargement since 2017. Study recommend  cerebral angiogram for evaluation.  Patient is here today for an image guided diagnostic cerebral angiogram for increased intracranial hypertension with facial weakness and numbness at the request of Dr. Arlean Hopping.  Patient resting in bed. She is alert and oriented, calm and pleasant.  Patient states she is NPO per order. Ordered labs are pending. She does not take blood thinners. She is in no apparent distress.  Risks and benefits of diagnostic angiogram were discussed with the patient including, but not limited to bleeding, infection, vascular injury or contrast induced renal failure.  This interventional procedure involves the use of X-rays and because of the nature of the planned procedure, it is possible that we will have prolonged use of X-ray fluoroscopy.  Potential radiation risks to you include (but are not limited to) the following: - A slightly elevated risk for cancer  several years later in life. This risk is typically less than 0.5% percent. This risk is low in comparison to the normal incidence of human cancer, which is 33% for women and 50% for men according to the [employer]. - Radiation induced injury can include skin redness, resembling a rash, tissue breakdown / ulcers and hair loss (which can be temporary or permanent).   The likelihood of either of these occurring depends on the difficulty of the procedure and whether you are sensitive to radiation due to previous procedures, disease, or genetic conditions.   IF your procedure requires a prolonged use of radiation, you will be notified and given written instructions for further action.  It is your responsibility to monitor the irradiated area for the 2 weeks following the procedure and to notify your physician if you are concerned that you have suffered a radiation induced injury.    All of the patient's questions were answered, patient is agreeable to proceed.  Consent signed and in chart.    Thank  you for this interesting consult.  I greatly enjoyed meeting Virginia Douglas and look forward to participating in their care.  A copy of this report was sent to the requesting provider on this date.  Electronically Signed: Tyson Alias, NP 05/31/2021, 8:36 AM   I spent a total of 30 minutes in face to face in clinical consultation, greater than 50% of which was counseling/coordinating care for image guided diagnostic cerebral angiogram with moderate sedation.

## 2021-06-02 ENCOUNTER — Other Ambulatory Visit: Payer: Self-pay | Admitting: Neurology

## 2021-06-02 ENCOUNTER — Other Ambulatory Visit: Payer: Self-pay

## 2021-06-02 ENCOUNTER — Ambulatory Visit (HOSPITAL_COMMUNITY)
Admission: RE | Admit: 2021-06-02 | Discharge: 2021-06-02 | Disposition: A | Discharge status: Home / Self Care | Payer: No Typology Code available for payment source | Source: Ambulatory Visit | Attending: Interventional Radiology | Admitting: Interventional Radiology

## 2021-06-02 DIAGNOSIS — R29810 Facial weakness: Secondary | ICD-10-CM

## 2021-06-02 DIAGNOSIS — R200 Anesthesia of skin: Secondary | ICD-10-CM

## 2021-06-02 DIAGNOSIS — Q2730 Arteriovenous malformation, site unspecified: Secondary | ICD-10-CM

## 2021-06-03 HISTORY — PX: IR RADIOLOGIST EVAL & MGMT: IMG5224

## 2021-06-06 ENCOUNTER — Other Ambulatory Visit: Payer: Self-pay | Admitting: Nurse Practitioner

## 2021-06-06 DIAGNOSIS — G4709 Other insomnia: Secondary | ICD-10-CM

## 2021-06-07 ENCOUNTER — Telehealth: Payer: Self-pay | Admitting: Student

## 2021-06-07 ENCOUNTER — Other Ambulatory Visit (HOSPITAL_COMMUNITY): Payer: Self-pay | Admitting: Interventional Radiology

## 2021-06-07 DIAGNOSIS — R200 Anesthesia of skin: Secondary | ICD-10-CM

## 2021-06-07 DIAGNOSIS — I771 Stricture of artery: Secondary | ICD-10-CM

## 2021-06-07 MED ORDER — CLOPIDOGREL BISULFATE 75 MG PO TABS
75.0000 mg | ORAL_TABLET | Freq: Every day | ORAL | 3 refills | Status: AC
Start: 2021-06-13 — End: 2021-10-11

## 2021-06-07 NOTE — Telephone Encounter (Signed)
75 mg Plavix daily x 30 days with 3 refills e-prescribed to patient's pharmacy [facility]. She is to start taking Plavix 75 mg daily plus Asprin 81 mg daily starting 06/13/21. Patient notified via phone call and she understands these instructions.   Patient asked about taking a leave from work and when she should start. She has a procedure planned for 06/27/21 but states her headaches are bad. I advised her to talk with her employer to find out how much time she can take off and to let us know if we need to fill out any paperwork for her.  Soyla Dryer, [place] [phone] 06/07/2021, 1:57 PM

## 2021-06-08 ENCOUNTER — Other Ambulatory Visit (HOSPITAL_COMMUNITY): Payer: Self-pay | Admitting: Interventional Radiology

## 2021-06-08 ENCOUNTER — Encounter (HOSPITAL_COMMUNITY): Payer: Self-pay

## 2021-06-08 DIAGNOSIS — R200 Anesthesia of skin: Secondary | ICD-10-CM

## 2021-06-08 DIAGNOSIS — R29810 Facial weakness: Secondary | ICD-10-CM

## 2021-06-09 ENCOUNTER — Encounter: Payer: Self-pay | Admitting: Nurse Practitioner

## 2021-06-10 ENCOUNTER — Other Ambulatory Visit: Payer: Self-pay | Admitting: Nurse Practitioner

## 2021-06-10 DIAGNOSIS — F419 Anxiety disorder, unspecified: Secondary | ICD-10-CM

## 2021-06-10 MED ORDER — CLONAZEPAM 0.5 MG PO TABS: ORAL_TABLET | ORAL | 1 refills | Status: DC

## 2021-06-20 ENCOUNTER — Other Ambulatory Visit (HOSPITAL_COMMUNITY): Payer: Self-pay

## 2021-06-20 ENCOUNTER — Encounter: Payer: Self-pay | Admitting: Rheumatology

## 2021-06-20 ENCOUNTER — Other Ambulatory Visit (HOSPITAL_COMMUNITY)
Admission: RE | Admit: 2021-06-20 | Discharge: 2021-06-20 | Disposition: A | Discharge status: Home / Self Care | Payer: No Typology Code available for payment source | Source: Ambulatory Visit | Attending: Interventional Radiology | Admitting: Interventional Radiology

## 2021-06-20 ENCOUNTER — Other Ambulatory Visit (HOSPITAL_COMMUNITY): Payer: Self-pay | Admitting: Interventional Radiology

## 2021-06-20 DIAGNOSIS — I771 Stricture of artery: Secondary | ICD-10-CM

## 2021-06-23 ENCOUNTER — Other Ambulatory Visit: Payer: Self-pay | Admitting: Radiology

## 2021-06-24 ENCOUNTER — Other Ambulatory Visit: Payer: Self-pay | Admitting: Student

## 2021-06-24 ENCOUNTER — Other Ambulatory Visit (HOSPITAL_COMMUNITY): Payer: Self-pay | Admitting: Interventional Radiology

## 2021-06-24 ENCOUNTER — Encounter (HOSPITAL_COMMUNITY): Payer: Self-pay | Admitting: Interventional Radiology

## 2021-06-24 LAB — PLATELET INHIBITION P2Y12

## 2021-06-24 NOTE — Anesthesia Preprocedure Evaluation (Addendum)
Anesthesia Evaluation  Patient identified by MRN, date of birth, ID band Patient awake    Reviewed: Allergy & Precautions, NPO status , Patient's Chart, lab work & pertinent test results  History of Anesthesia Complications Negative for: history of anesthetic complications  Airway Mallampati: II  TM Distance: >3 FB Neck ROM: Full    Dental  (+)    Pulmonary former smoker,    Pulmonary exam normal        Cardiovascular hypertension, Pt. on medications Normal cardiovascular exam     Neuro/Psych  Headaches, Anxiety Depression    GI/Hepatic Neg liver ROS, GERD  ,  Endo/Other  diabetes, Type 2  Renal/GU negative Renal ROS  negative genitourinary   Musculoskeletal  (+) Arthritis ,   Abdominal   Peds  Hematology negative hematology ROS (+)   Anesthesia Other Findings Day of surgery medications reviewed with patient.  Reproductive/Obstetrics negative OB ROS                           Anesthesia Physical Anesthesia Plan  ASA: 3  Anesthesia Plan: General   Post-op Pain Management:    Induction: Intravenous  PONV Risk Score and Plan: 3 and Midazolam, Treatment may vary due to age or medical condition, Ondansetron and Dexamethasone  Airway Management Planned: Oral ETT  Additional Equipment: Arterial line  Intra-op Plan:   Post-operative Plan: Extubation in OR  Informed Consent: I have reviewed the patients History and Physical, chart, labs and discussed the procedure including the risks, benefits and alternatives for the proposed anesthesia with the patient or authorized representative who has indicated his/her understanding and acceptance.     Dental advisory given  Plan Discussed with: CRNA  Anesthesia Plan Comments: (PAT note written 06/24/2021 by Myra Gianotti, PA-C. )      Anesthesia Quick Evaluation

## 2021-06-24 NOTE — Telephone Encounter (Signed)
We can order SSA antibody (Ro) if she would like to come in for the labs.  Patient is on multiple medications which can cause dry mouth and dry eyes.

## 2021-06-24 NOTE — Progress Notes (Addendum)
Ms Carstens denies shortness of breath or chest pain. Patient denies having any s/s of Covid in her household.  Patient denies any known exposure to Covid.  Ms Dewalt has type II diabetes, patient reports that she only checks CBG when she feels like it may below.   I instructed patient to check CBG after awaking and every 2 hours until arrival  to the hospital.  I Instructed patient if CBG is less than 70 to take 4 Glucose Tablets or 1 tube of Glucose Gel or 1/2 cup of a clear juice. Recheck CBG in 15 minutes if CBG is not over 70 call, pre- op desk at [phone] for further instructions. A1C was 6.8 in June, 2022.   Ms Steinberg's PCP is [name], FNP, Neurologist Dr. Pleas Koch,  patient sees Dr. Arlean Hopping for arthritis.  Ms Piontek reports that her smile is not quite right, patient denies any numbness at this time.  I instructed patient to shower with antibiotic soap, if it is available.  Dry off with a clean towel. Do not put lotion, powder, cologne or deodorant or makeup.No jewelry or piercings. Men may shave their face and neck. Woman should not shave. No nail polish, artificial or acrylic nails. Wear clean clothes, brush your teeth. Glasses, contact lens,dentures or partials may not be worn in the OR. If you need to wear them, please bring a case for glasses, do not wear contacts or bring a case, the hospital does not have contact cases, dentures or partials will have to be removed , make sure they are clean, we will provide a denture cup to put them in.

## 2021-06-24 NOTE — Progress Notes (Signed)
Anesthesia Chart Review: Virginia Douglas  Case: [id number] Date/Time: 06/27/21 0815   Procedure: STENTING   Anesthesia type: General   Pre-op diagnosis: STENOSIS   Location: [facility] OR   Surgeons: Luanne Bras, MD       DISCUSSION: Patient is a 45 year old female scheduled for the IR procedure under anesthesia.  History includes former smoker, anemia, DM2, edema, HTN (Denied), ADHD, migraines, hysterectomy (08/30/10). Neurology notes indicate that she was diagnosed with idiopathic intracranial hypertension in 2019 (empty sella on imaging) that was treated with Diamox.  Rheumatology notes indicate history of Raynaud's phenomenon---she had "negative" autoimmune labs drawn on 01/19/21.  She reported episode of right facial weakness and numbness to neurologist Dr. Tomi Likens on 04/28/21 which lead to a brain MRI on 05/19/21 showed subtle enlargement (~ 8 mm size) of a left superior cerebellum lesion since 2017. S/p cerebral arteriogram 05/31/21 which showed: 1.approx 3.4 mm x 4.3 mm nidus of a fast flow AVM arising fronm the Lt PCA P2-P3 seg ,associated with a 11.66m x 4.745mlobulated venous dilatation. 2.high grade Lt SS and  mod RT SS stenosis.  IR felt severe stenosis in the proximal left sigmoid sinus and probably the right sigmoid sinus proximally was likely responsible for her severe debilitating pulsatile tinnitus. Endovascular revascularization of the stenosed dural sigmoid sinuses was discussed, likely one side at a time. She is to start DAPT at least 10 days prior to the procedure. ASA 81 mg and Plavix 75 mg are listed. P2Y12 104 on 06/20/21 [facility]  Anest[facility] team to evaluate on the day of surgery.   VS: Ht 5' 6"  (1.676 m)   Wt 104.3 kg   BMI 37.12 kg/m  BP Readings from Last 3 Encounters:  05/31/21 117/70  04/28/21 (!) 145/88  04/21/21 114/68   Pulse Readings from Last 3 Encounters:  05/31/21 85  04/28/21 (!) 101  04/21/21 (!) 104      PROVIDERS: MoMinette BrineFNP is PCP  DeBo MerinoMD is rheumatologist JaMetta ClinesDO is neurologist  LABS: For day of procedure as indicated by Dr. DeEstanislado PandyAs of 05/31/21, H/H 12.7/38.9, PLT 323, Cr 0.64, glucose 96. A1c 6.8% 02/28/21. Normal PT/PTT 05/31/21.    IMAGES: MRI Brain 05/19/21: IMPRESSION: 1. Small roughly 8 mm mixed nodular and serpiginous enhancing lesion of the left superior cerebellum is highly vascular, with evidence of both fast and slow flow components. Subtle evidence of this in 2017, with enlargement since that time. No significant mass effect or edema. Differential considerations include acquired vascular AVM or dural venous fistula. Small cerebellar Hemangioblastoma is also possible. Cavernous venous malformation with small DVA were also considered but less likely. And meningioma is felt highly unlikely. Conventional cerebral angiogram might be the next best step in evaluation. 2. Chronic partially empty sella, and other features raising the possibility of idiopathic intracranial hypertension (pseudotumor cerebri).    MRI C-spine 03/20/21: IMPRESSION: 1. No significant disc pathology, stenosis, or evidence for neural impingement within the cervical spine. 2. Mild anterior reactive endplate spurring at C5W3-8hrough C7-T1. 3. Empty sella. While this finding is often incidental in nature and of no clinical significance, this can also be seen in the setting of idiopathic intracranial hypertension.   EKG: EKG 10/12/20: SR at 96 bpm. First degree AV block.   CV: N/A  Past Medical History:  Diagnosis Date   ADHD (attention deficit hyperactivity disorder)    Anemia    iron deficiency   as  a teenager   Anxiety    Arthritis    Diabetes ([place])    Edema    GERD (gastroesophageal reflux disease)    Hypertension    patient denies- patient stated that the PCP said it was for heart prevention.   Migraine headache     Past Surgical History:   Procedure Laterality Date   ABDOMINAL HYSTERECTOMY     CHOLECYSTECTOMY     COLONOSCOPY  12/28/2020   polyps   ESOPHAGEAL DILATION     IR ANGIO INTRA EXTRACRAN SEL COM CAROTID INNOMINATE BILAT MOD SED  05/31/2021   IR ANGIO VERTEBRAL SEL VERTEBRAL UNI R MOD SED  05/31/2021   IR RADIOLOGIST EVAL & MGMT  06/03/2021   IR US GUIDE VASC ACCESS RIGHT  05/31/2021   OVARIAN CYST REMOVAL      MEDICATIONS: No current facility-administered medications for this encounter.    acetaZOLAMIDE (DIAMOX) 250 MG tablet   ADDERALL XR 30 MG 24 hr capsule   aspirin EC 81 MG tablet   atorvastatin (LIPITOR) 10 MG tablet   Biotin (BIOTIN 5000) 5 MG CAPS   buPROPion (WELLBUTRIN XL) 150 MG 24 hr tablet   cetirizine (ZYRTEC) 10 MG tablet   clonazePAM (KLONOPIN) 0.5 MG tablet   clopidogrel (PLAVIX) 75 MG tablet   DULoxetine (CYMBALTA) 60 MG capsule   famotidine (PEPCID) 20 MG tablet   Fremanezumab-vfrm (AJOVY) 225 MG/1.5ML SOAJ   HYDROcodone-acetaminophen (NORCO) 10-325 MG tablet   hydrOXYzine (VISTARIL) 50 MG capsule   Lasmiditan Succinate (REYVOW) 100 MG TABS   Lifitegrast (XIIDRA) 5 % SOLN   linaclotide (LINZESS) 72 MCG capsule   lisinopril-hydrochlorothiazide (ZESTORETIC) 10-12.5 MG tablet   Multiple Vitamins-Minerals (MULTIPLE VITAMINS/WOMENS PO)   niacin 50 MG tablet   omeprazole (PRILOSEC) 40 MG capsule   ondansetron (ZOFRAN) 4 MG tablet   saccharomyces boulardii (FLORASTOR) 250 MG capsule   Semaglutide, 1 MG/DOSE, (OZEMPIC, 1 MG/DOSE,) 4 MG/3ML SOPN   topiramate (TOPAMAX) 50 MG tablet   TURMERIC PO   Vitamin D, Ergocalciferol, (DRISDOL) 1.25 MG (50000 UNIT) CAPS capsule   Blood Glucose Monitoring Suppl (ONETOUCH VERIO IQ SYSTEM) w/Device KIT   eletriptan (RELPAX) 40 MG tablet   glucose blood test strip   Insulin Pen Needle (PEN NEEDLES) 32G X 4 MM MISC   phentermine 15 MG capsule   Medication list indicates phentermine has been discontinued.   Myra Gianotti, PA-C Surgical Short  Stay/Anesthesiology [facility] Phone [phone] [facility] Phone [phone] 06/24/2021 4:50 PM

## 2021-06-25 LAB — SARS CORONAVIRUS 2 (TAT 6-24 HRS): SARS Coronavirus 2: NEGATIVE

## 2021-06-27 ENCOUNTER — Encounter (HOSPITAL_COMMUNITY)
Admission: AD | Disposition: A | Discharge status: Home / Self Care | Payer: Self-pay | Source: Home / Self Care | Attending: Interventional Radiology

## 2021-06-27 ENCOUNTER — Ambulatory Visit (HOSPITAL_COMMUNITY)
Admission: RE | Admit: 2021-06-27 | Discharge: 2021-06-27 | Disposition: A | Discharge status: Home / Self Care | Payer: No Typology Code available for payment source | Source: Ambulatory Visit | Attending: Interventional Radiology | Admitting: Interventional Radiology

## 2021-06-27 ENCOUNTER — Ambulatory Visit (HOSPITAL_COMMUNITY): Payer: No Typology Code available for payment source | Admitting: Anesthesiology

## 2021-06-27 ENCOUNTER — Inpatient Hospital Stay (HOSPITAL_COMMUNITY)
Admission: AD | Admit: 2021-06-27 | Discharge: 2021-06-28 | DRG: 254 | Disposition: A | Discharge status: Home / Self Care | Payer: No Typology Code available for payment source | Attending: Interventional Radiology | Admitting: Interventional Radiology

## 2021-06-27 ENCOUNTER — Encounter (HOSPITAL_COMMUNITY): Payer: Self-pay | Admitting: Interventional Radiology

## 2021-06-27 ENCOUNTER — Other Ambulatory Visit: Payer: Self-pay

## 2021-06-27 DIAGNOSIS — R200 Anesthesia of skin: Secondary | ICD-10-CM

## 2021-06-27 DIAGNOSIS — Z7902 Long term (current) use of antithrombotics/antiplatelets: Secondary | ICD-10-CM | POA: Diagnosis not present

## 2021-06-27 DIAGNOSIS — H93A3 Pulsatile tinnitus, bilateral: Secondary | ICD-10-CM | POA: Diagnosis present

## 2021-06-27 DIAGNOSIS — Z823 Family history of stroke: Secondary | ICD-10-CM

## 2021-06-27 DIAGNOSIS — Z8249 Family history of ischemic heart disease and other diseases of the circulatory system: Secondary | ICD-10-CM | POA: Diagnosis not present

## 2021-06-27 DIAGNOSIS — F909 Attention-deficit hyperactivity disorder, unspecified type: Secondary | ICD-10-CM | POA: Diagnosis present

## 2021-06-27 DIAGNOSIS — G932 Benign intracranial hypertension: Secondary | ICD-10-CM | POA: Diagnosis present

## 2021-06-27 DIAGNOSIS — Z87891 Personal history of nicotine dependence: Secondary | ICD-10-CM | POA: Diagnosis not present

## 2021-06-27 DIAGNOSIS — K219 Gastro-esophageal reflux disease without esophagitis: Secondary | ICD-10-CM | POA: Diagnosis present

## 2021-06-27 DIAGNOSIS — E611 Iron deficiency: Secondary | ICD-10-CM | POA: Diagnosis present

## 2021-06-27 DIAGNOSIS — Z833 Family history of diabetes mellitus: Secondary | ICD-10-CM

## 2021-06-27 DIAGNOSIS — I871 Compression of vein: Principal | ICD-10-CM | POA: Diagnosis present

## 2021-06-27 DIAGNOSIS — F419 Anxiety disorder, unspecified: Secondary | ICD-10-CM | POA: Diagnosis present

## 2021-06-27 DIAGNOSIS — Z803 Family history of malignant neoplasm of breast: Secondary | ICD-10-CM

## 2021-06-27 DIAGNOSIS — E119 Type 2 diabetes mellitus without complications: Secondary | ICD-10-CM | POA: Diagnosis present

## 2021-06-27 DIAGNOSIS — I771 Stricture of artery: Secondary | ICD-10-CM

## 2021-06-27 DIAGNOSIS — Z7984 Long term (current) use of oral hypoglycemic drugs: Secondary | ICD-10-CM | POA: Diagnosis not present

## 2021-06-27 DIAGNOSIS — Z7982 Long term (current) use of aspirin: Secondary | ICD-10-CM | POA: Diagnosis not present

## 2021-06-27 DIAGNOSIS — Z841 Family history of disorders of kidney and ureter: Secondary | ICD-10-CM | POA: Diagnosis not present

## 2021-06-27 DIAGNOSIS — Z7985 Long-term (current) use of injectable non-insulin antidiabetic drugs: Secondary | ICD-10-CM | POA: Diagnosis not present

## 2021-06-27 HISTORY — DX: Attention-deficit hyperactivity disorder, unspecified type: F90.9

## 2021-06-27 HISTORY — DX: Anxiety disorder, unspecified: F41.9

## 2021-06-27 HISTORY — PX: IR US GUIDE VASC ACCESS RIGHT: IMG2390

## 2021-06-27 HISTORY — PX: IR INTRA CRAN STENT: IMG2345

## 2021-06-27 HISTORY — DX: Gastro-esophageal reflux disease without esophagitis: K21.9

## 2021-06-27 HISTORY — PX: IR CT HEAD LTD: IMG2386

## 2021-06-27 HISTORY — DX: Unspecified osteoarthritis, unspecified site: M19.90

## 2021-06-27 HISTORY — PX: IR ANGIO INTRA EXTRACRAN SEL COM CAROTID INNOMINATE BILAT MOD SED: IMG5360

## 2021-06-27 HISTORY — DX: Essential (primary) hypertension: I10

## 2021-06-27 HISTORY — PX: RADIOLOGY WITH ANESTHESIA: SHX6223

## 2021-06-27 LAB — CBC WITH DIFFERENTIAL/PLATELET
Abs Immature Granulocytes: 0.06 10*3/uL (ref 0.00–0.07)
Basophils Absolute: 0.1 10*3/uL (ref 0.0–0.1)
Basophils Relative: 1 %
Eosinophils Absolute: 0.2 10*3/uL (ref 0.0–0.5)
Eosinophils Relative: 1 %
HCT: 40.1 % (ref 36.0–46.0)
Hemoglobin: 13.1 g/dL (ref 12.0–15.0)
Immature Granulocytes: 1 %
Lymphocytes Relative: 26 %
Lymphs Abs: 2.8 10*3/uL (ref 0.7–4.0)
MCH: 28.1 pg (ref 26.0–34.0)
MCHC: 32.7 g/dL (ref 30.0–36.0)
MCV: 86.1 fL (ref 80.0–100.0)
Monocytes Absolute: 0.9 10*3/uL (ref 0.1–1.0)
Monocytes Relative: 8 %
Neutro Abs: 6.9 10*3/uL (ref 1.7–7.7)
Neutrophils Relative %: 63 %
Platelets: 329 10*3/uL (ref 150–400)
RBC: 4.66 MIL/uL (ref 3.87–5.11)
RDW: 15.9 % — ABNORMAL HIGH (ref 11.5–15.5)
WBC: 10.9 10*3/uL — ABNORMAL HIGH (ref 4.0–10.5)
nRBC: 0 % (ref 0.0–0.2)

## 2021-06-27 LAB — PROTIME-INR
INR: 1 (ref 0.8–1.2)
Prothrombin Time: 12.9 seconds (ref 11.4–15.2)

## 2021-06-27 LAB — GLUCOSE, CAPILLARY
Glucose-Capillary: 116 mg/dL — ABNORMAL HIGH (ref 70–99)
Glucose-Capillary: 157 mg/dL — ABNORMAL HIGH (ref 70–99)

## 2021-06-27 LAB — BASIC METABOLIC PANEL
Anion gap: 12 (ref 5–15)
BUN: 9 mg/dL (ref 6–20)
CO2: 21 mmol/L — ABNORMAL LOW (ref 22–32)
Calcium: 9.3 mg/dL (ref 8.9–10.3)
Chloride: 106 mmol/L (ref 98–111)
Creatinine, Ser: 0.62 mg/dL (ref 0.44–1.00)
GFR, Estimated: >60 mL/min (ref >60)
Glucose, Bld: 98 mg/dL (ref 70–99)
Potassium: 3.5 mmol/L (ref 3.5–5.1)
Sodium: 139 mmol/L (ref 135–145)

## 2021-06-27 LAB — POCT ACTIVATED CLOTTING TIME
Activated Clotting Time: 208 seconds
Activated Clotting Time: 237 seconds

## 2021-06-27 LAB — HEPARIN LEVEL (UNFRACTIONATED): Heparin Unfractionated: <0.1 IU/mL — ABNORMAL LOW (ref 0.30–0.70)

## 2021-06-27 LAB — MRSA NEXT GEN BY PCR, NASAL: MRSA by PCR Next Gen: NOT DETECTED

## 2021-06-27 IMAGING — US IR INTRACRANIAL STENT (INCL PTA)
1 of 2 series · 8 of 24 positions shown · non-contrast
Comparison: Diagnostic catheter arteriogram [DATE].

CLINICAL DATA: History of benign intracranial hypertension. Failed
medical treatment. Patient with bilateral intractable pulsatile
tinnitus left greater than right. Stenosis of both sigmoid sinuses
proximally left greater than right on previous diagnostic catheter
arteriogram.

EXAM:
INTRACRANIAL STENT (INCL PTA)
TECHNIQUE: Informed written consent was obtained from the patient after a
thorough discussion of the procedural risks, benefits and
alternatives. All questions were addressed. Maximal Sterile Barrier
Technique was utilized including caps, mask, sterile gowns, sterile
gloves, sterile drape, hand hygiene and skin antiseptic. A timeout
was performed prior to the initiation of the procedure.

[Series 300: ir pta intracranial · 8 of 357 slices shown]
[im 1/357]
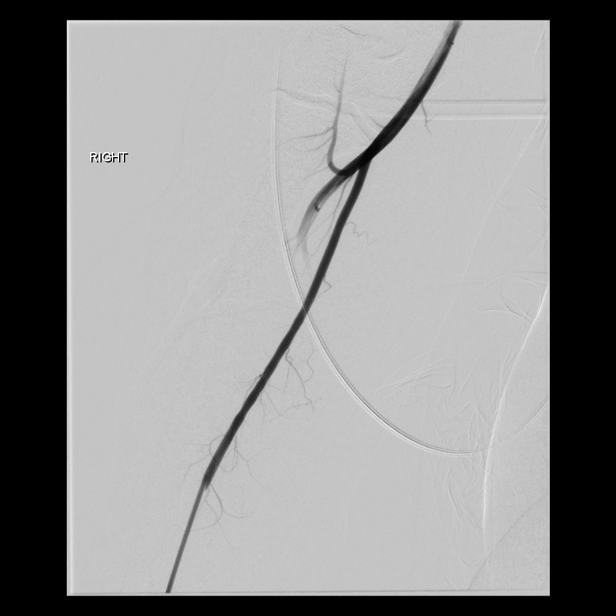
[im 49/357]
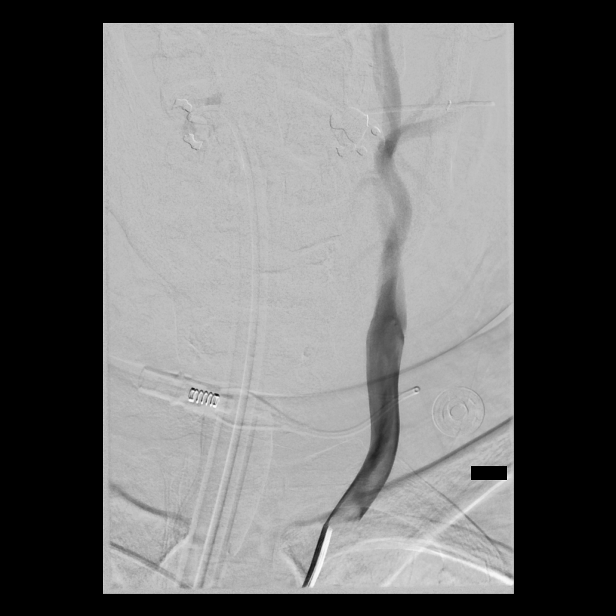
[im 98/357]
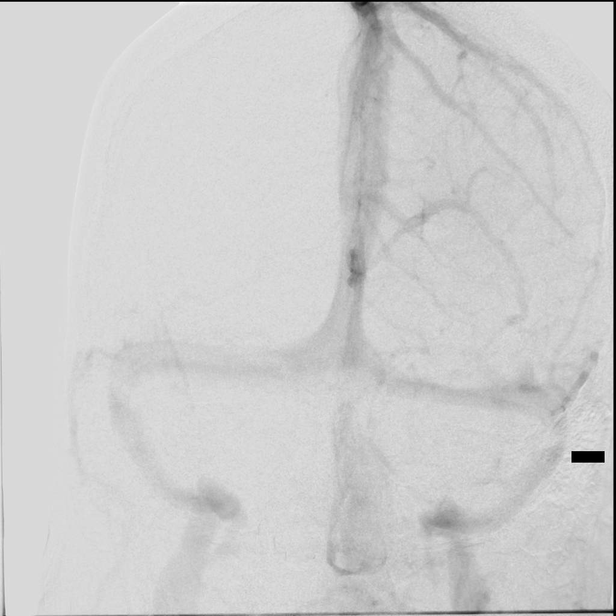
[im 146/357]
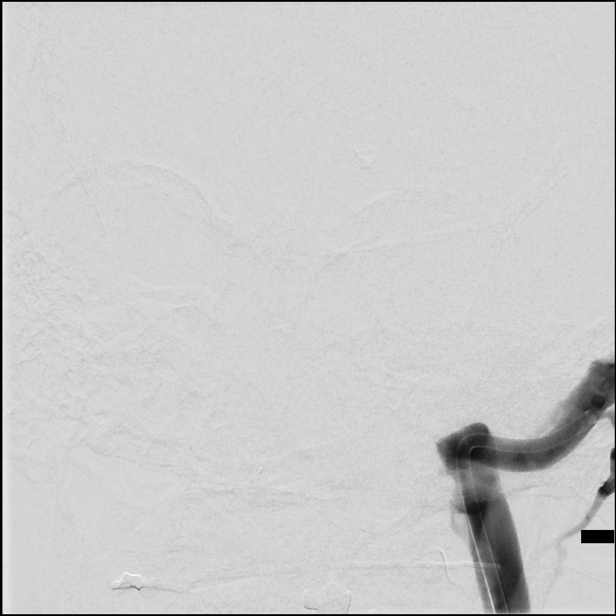
[im 195/357]
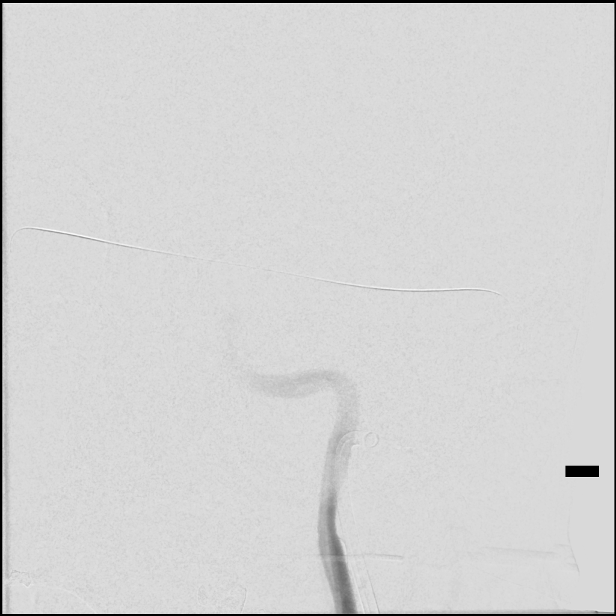
[im 243/357]
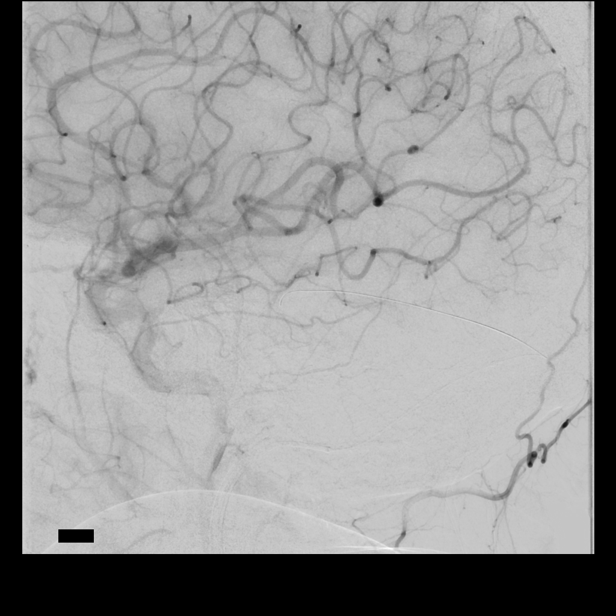
[im 292/357]
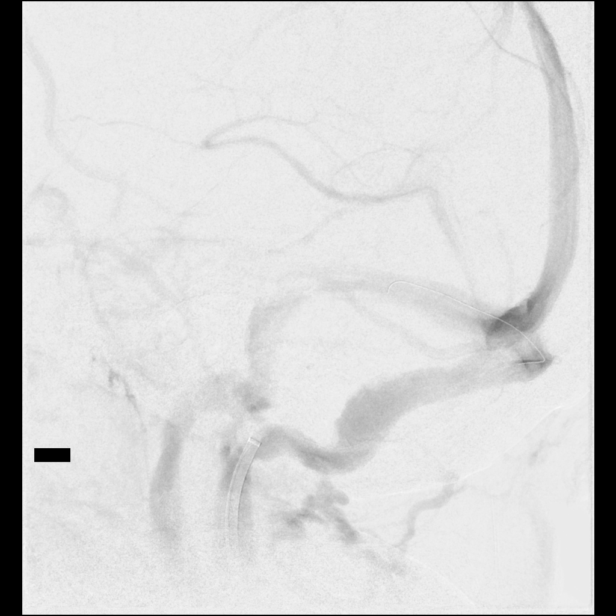
[im 340/357]
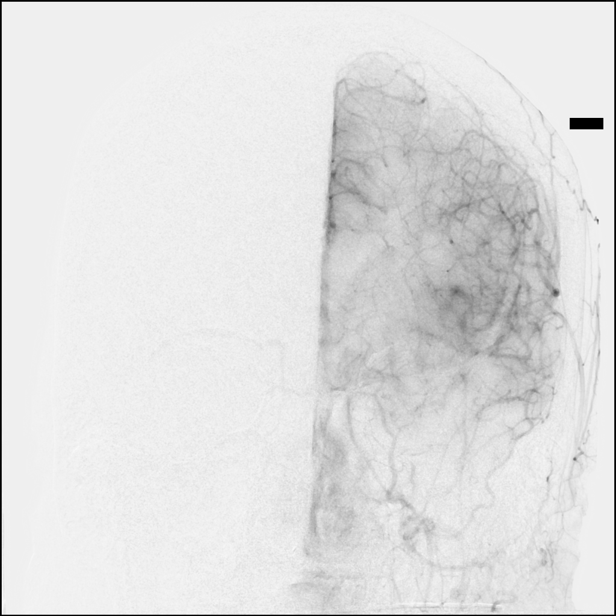

[8 of 24 positions shown; findings below may reference images not displayed]

MEDICATIONS:
Heparin 5,000 units IV. Ancef 2 g IV antibiotic was administered
within 1 hour of the procedure.

ANESTHESIA/SEDATION:
General anesthesia.

CONTRAST:  Omnipaque 300 approximately 120 mL.

FLUOROSCOPY TIME:  Fluoroscopy Time: 39 minutes 30 seconds ([53]
mGy).

COMPLICATIONS:
None immediate.
The right forearm was prepped and draped in the usual sterile
manner. The right radial artery was then identified with ultrasound,
and its morphology documented. A dorsal palmar anastomosis was
verified to be present. Using ultrasound guidance and a
micropuncture set access into the right radial artery was obtained
over a 0.018 inch micro guidewire. A [DATE] French radial sheath was
then inserted without event. The micro guidewire, and obturator were
removed. Good aspiration obtained from the side port of the radial
sheath. A cocktail of [53] units of heparin, 200 mcg of
nitroglycerin, 2.5 mg of verapamil was then infused in diluted form
without event. A right radial arteriogram was then performed.

Over a 0.035 inch glidewire a 5 RENSCHI 2 diagnostic catheter
was advanced to the aortic arch region, and selected position in the
right common carotid artery and the left common carotid artery.

The right groin was prepped and draped in the usual sterile fashion.
Thereafter using modified Seldinger technique, transfemoral access
into the right common femoral vein was obtained without difficulty.
Over a 0.035 inch guidewire, a 5 French Pinnacle sheath was
inserted. Through this, and also over 0.035 inch guidewire, a 5
French JB 1 catheter was advanced over a 0.035 inch a J-tip wire to
the left brachiocephalic vein and positioned just proximal to the
entry point of the left internal jugular vein which had been
identified via a left common carotid arteriogram into the delayed
venous phase.
FINDINGS: The right common carotid arteriogram demonstrates the right external
carotid artery and its major branches to be widely patent.

The right internal carotid artery at the bulb to the cranial skull
base is widely patent.

The petrous, cavernous and supraclinoid segments are widely patent.

The right middle cerebral artery and the right anterior cerebral
artery opacify into the capillary and venous phases.

The venous phase again identifies a high-grade stenosis of the
sigmoid sinuses bilaterally left greater than right.

Left common carotid arteriogram demonstrates the left external
carotid artery and its major branches to be widely patent.

The left internal carotid artery at the bulb to the cranial skull
base is widely patent.

The petrous, the cavernous and the supraclinoid segments are widely
patent.

The left middle cerebral artery and the left anterior cerebral
artery opacify into the capillary and venous phases with the venous
phase again demonstrating the high-grade stenosis of both sigmoid
sinuses proximally left greater than right.

The RENSCHI 2 diagnostic catheter in the left common carotid artery
was then connected to continuous heparinized saline infusion.

ENDOVASCULAR STENT ASSISTED ANGIOPLASTY OF SEVERE STENOSIS OF THE
LEFT SIGMOID SINUS

The diagnostic JB 1 catheter in the left brachiocephalic vein was
then advanced using biplane roadmap technique and constant
fluoroscopic guidance over a 0.035 inch Roadrunner guidewire to the
skull base. Guidewire was removed. Good aspiration was obtained from
the hub of the diagnostic catheter. This in turn was then exchanged
over a 0.035 inch Terumo 260 cm exchange guidewire for a 9 French
Neuron Max sheath which was advanced under fluoroscopic guidance
without difficulty to the left internal jugular vein at the cranial
skull base. Exchange guidewire was then gently manipulated without
difficulty and advanced passed the left internal jugular bulb and
into the proximal sigmoid sinus followed by the 8 French Neuron Max
sheath. The guidewire was removed. Good aspiration obtained from the
hub of the Neuron Max sheath. Gentle control venogram performed
demonstrated tip of the Neuron Max sheath to be in the left sigmoid
sinus. No evidence of dissections or vasospasm was noted. This in
turn was then connected to continuous heparinized saline infusion.

Over a 0.014 inch standard Synchro micro guidewire with a moderate J
configuration, a Trevo ProVue microcatheter was then advanced to the
distal end of the Neuron Max sheath in the left sigmoid sinus. The
micro guidewire was then gently advanced under constant fluoroscopic
guidance through the severely stenotic proximal sigmoid sinus into
the proximal transverse sinus and subsequently across to the right
transverse sinus followed by the microcatheter. The guidewire was
removed. Good aspiration obtained from the hub of the microcatheter.
Gentle control arteriogram performed through the microcatheter
demonstrated brisk flow through the right transverse sinus
antegradely into the right internal jugular vein again revealing a
high-grade stenosis of the proximal right sigmoid sinus. The
microcatheter was then exchanged for a 014 inch 300 cm Zoom exchange
micro guidewire under constant fluoroscopic guidance. The distal end
of the exchange micro guidewire was stabilized in the distal right
transverse sinus.

Measurements were then performed of the normal appearing sigmoid
sinus proximally and distally at the desired landing zones.

It was elected to proceed first with a balloon angioplasty using a 6
mm x 40 mm RENSCHI balloon angioplasty catheter.

The balloon angioplasty was prepped antegradely with heparinized
saline infusion, and also with 60% contrast and 40% heparinized
saline infusion.

Balloon was then advanced over the exchange micro guidewire and
advanced without difficulty to just distal to the tip of the Neuron
Max sheath. This was then gently advanced such that the distal
marker on the angioplasty balloon catheter was just proximal to the
right transverse sinus sigmoid sinus junction with the proximal
marker also covering the sigmoid sinus just proximal to the left
internal jugular bulb.

Thereafter, a controlled inflation was performed using micro
inflation syringe device via micro tubing to 7 atmospheres where it
was maintained for approximately 40 seconds. Thereafter balloon was
deflated and retrieved slightly proximal. Control arteriogram
performed through the left common carotid artery into the delayed
venous phase demonstrated significantly improved caliber of the
previously noted stenotic left sigmoid sinus proximally.

However, there remained a moderate waist more proximally. A second
angioplasty was then performed with the balloon inflated to 8
atmospheres where it was maintained for approximately 30 seconds.
Balloon was deflated and retrieved proximally. A control arteriogram
performed through the left common carotid artery into the delayed
venous phase demonstrated significantly improved caliber of the left
sigmoid sinus stenosis.

???? was then performed again of the left sigmoid sinus proximally
and distally its most normal appearing segments.

It was decided to place a 8 mm x 40 mm Precise stent. Stent delivery
catheter was then prepped retrogradely and antegradely with
heparinized saline infusion.

Thereafter using the rapid exchange technique, it was advanced
without difficulty to the distal end of the 8 French Neuron Max
sheath in the left sigmoid sinus and advanced without difficulty
such that it covered the entire previously noted stenotic left
sigmoid sinus. The most proximal portion of the stent was just
distal to the left transverse sinus sigmoid sinus junction.

Constant fluoroscopic guidance slow delivery of the stent was then
undertaken without difficulty. The delivery apparatus was retrieved
and removed with the exchange wire maintained in the right
transverse sinus. A control arteriogram performed through the left
common carotid artery into the delayed venous phase again
demonstrated excellent flow with complete resolution of the
previously noted stenosis. Free flow was noted into the left
transverse sinus, sigmoid sinus and internal jugular vein.

Control arteriograms were then performed at 15 and 30 minutes post
deployment of the stent. These continued to demonstrate excellent
flow through the stented segment of the left sigmoid sinus and the
right transverse sinus.

No evidence of intraluminal filling defects or of stent
abnormalities were seen.

Under fluoroscopic guidance exchange guidewire was removed. The 8
French Neuron Max sheath was then retrieved into the left internal
jugular vein. A final control arteriogram performed through the left
common carotid artery into the delayed venous phase demonstrated
excellent flow through the stented left sigmoid sinus with again
noted significant stenosis of the right sigmoid sinus.

Vein of [place] and the left posterior temporal veins demonstrated
antegrade flow into the distal left transverse sinus.

The diagnostic catheter in the left common femoral artery was then
retrieved and removed. A wrist band was then applied for hemostasis
at the right radial puncture site. Distal right radial pulse was
verified to be present.

At the right common femoral vein puncture site, pressure was held
with the clot for approximately 15 minutes with hemostasis. Distal
pulses remained Dopplerable in both feet unchanged.

A CT of the brain demonstrated no evidence of intracranial
hemorrhage or mass effect.

The patient was then extubated. Upon recovery, the patient reported
soreness of the throat. Otherwise, she denied any headaches, nausea
or vomiting. She denied any vision difficulties. She moved all fours
equally spontaneously and to command.

She was transferred to PACU and then neuro ICU. In the late
afternoon, the patient reported a [DATE] generalized headache which
responded to Tylenol. She reports no visual symptoms. Overnight, the
patient's blood pressure remained stable. She complained of back
ache which was treated with oxycodone p.o. Toradol was added for
headache as necessary.

She was able to tolerate liquids and solids throughout the night.
The following morning, the IV heparin was stopped. The patient was
able to tolerate solids and liquids. She was started on aspirin 325
mg a day, and Plavix 75 mg a day.

Neurologically, she did complain of a generalized headache which
apparently responded to Toradol. No nausea or vomiting or visual
changes.

She had no motor abnormalities on neurologic examination. She
reported no symptoms of left-sided pulsatile tinnitus.

Patient was ambulated with assistance and then independently. She
was then discharged home under the care of her family. Specific
instructions were to refrain from stooping, bending or lifting
weights above 10 pounds for 2 weeks. Strongly advised to maintain
adequate hydration and to continue her home medications.

She was advised not to drive for 2 weeks. Patient was advised to
call [phone] should she experience stroke-like symptoms. Patient
expressed understanding and agreement with the above management
plan.
IMPRESSION: Status post endovascular stent assisted angioplasty of symptomatic
high-grade stenosis of the left sigmoid sinus proximally for
intractable pulsatile tinnitus.

Patient also noted to have significant right proximal sigmoid sinus
stenosis.

PLAN:
Follow-up in the clinic 2 weeks post discharge to discuss possible
right sigmoid sinus stenosis treatment.

## 2021-06-27 SURGERY — IR WITH ANESTHESIA
Anesthesia: General

## 2021-06-27 MED ORDER — CLOPIDOGREL BISULFATE 75 MG PO TABS
75.0000 mg | ORAL_TABLET | ORAL | Status: AC
  Administered 2021-06-27: 75 mg via ORAL
  Filled 2021-06-27: qty 1

## 2021-06-27 MED ORDER — PHENYLEPHRINE HCL (PRESSORS) 10 MG/ML IV SOLN
INTRAVENOUS | Status: DC | PRN
  Administered 2021-06-27: 80 ug via INTRAVENOUS

## 2021-06-27 MED ORDER — HEPARIN (PORCINE) 25000 UT/250ML-% IV SOLN
INTRAVENOUS | Status: AC
  Filled 2021-06-27: qty 250

## 2021-06-27 MED ORDER — MIDAZOLAM HCL 5 MG/5ML IJ SOLN
INTRAMUSCULAR | Status: DC | PRN
  Administered 2021-06-27: 2 mg via INTRAVENOUS

## 2021-06-27 MED ORDER — CEFAZOLIN SODIUM-DEXTROSE 2-4 GM/100ML-% IV SOLN
2.0000 g | INTRAVENOUS | Status: AC
  Administered 2021-06-27: 2 g via INTRAVENOUS
  Filled 2021-06-27: qty 100

## 2021-06-27 MED ORDER — DEXAMETHASONE SODIUM PHOSPHATE 10 MG/ML IJ SOLN
INTRAMUSCULAR | Status: DC | PRN
  Administered 2021-06-27: 10 mg via INTRAVENOUS

## 2021-06-27 MED ORDER — ORAL CARE MOUTH RINSE: 15.0000 mL | Freq: Once | OROMUCOSAL | Status: AC

## 2021-06-27 MED ORDER — IOHEXOL 300 MG/ML  SOLN
100.0000 mL | Freq: Once | INTRAMUSCULAR | Status: AC | PRN
  Administered 2021-06-27: 75 mL via INTRA_ARTERIAL

## 2021-06-27 MED ORDER — CLOPIDOGREL BISULFATE 75 MG PO TABS
75.0000 mg | ORAL_TABLET | Freq: Every day | ORAL | Status: DC
  Administered 2021-06-28: 75 mg via ORAL
  Filled 2021-06-27: qty 1

## 2021-06-27 MED ORDER — NITROGLYCERIN 1 MG/10 ML FOR IR/CATH LAB
INTRA_ARTERIAL | Status: AC
  Filled 2021-06-27: qty 20

## 2021-06-27 MED ORDER — CLEVIDIPINE BUTYRATE 0.5 MG/ML IV EMUL
0.0000 mg/h | INTRAVENOUS | Status: DC
Start: 2021-06-27 — End: 2021-06-28
  Administered 2021-06-27: 4 mg/h via INTRAVENOUS
  Administered 2021-06-27: 11 mg/h via INTRAVENOUS
  Administered 2021-06-27: 8 mg/h via INTRAVENOUS
  Administered 2021-06-27: 11 mg/h via INTRAVENOUS
  Administered 2021-06-27: 8 mg/h via INTRAVENOUS
  Administered 2021-06-28: 11 mg/h via INTRAVENOUS
  Administered 2021-06-28: 9 mg/h via INTRAVENOUS
  Filled 2021-06-27 (×8): qty 50

## 2021-06-27 MED ORDER — CLOPIDOGREL BISULFATE 75 MG PO TABS: 75.0000 mg | ORAL_TABLET | Freq: Every day | ORAL | Status: DC

## 2021-06-27 MED ORDER — DEXMEDETOMIDINE (PRECEDEX) IN NS 20 MCG/5ML (4 MCG/ML) IV SYRINGE
PREFILLED_SYRINGE | INTRAVENOUS | Status: DC | PRN
  Administered 2021-06-27: 12 ug via INTRAVENOUS
  Administered 2021-06-27: 4 ug via INTRAVENOUS
  Administered 2021-06-27: 12 ug via INTRAVENOUS
  Administered 2021-06-27: 4 ug via INTRAVENOUS

## 2021-06-27 MED ORDER — HEPARIN (PORCINE) 25000 UT/250ML-% IV SOLN
800.0000 [IU]/h | INTRAVENOUS | Status: DC
  Administered 2021-06-27: 800 [IU]/h via INTRAVENOUS
  Filled 2021-06-27: qty 250

## 2021-06-27 MED ORDER — CHLORHEXIDINE GLUCONATE 0.12 % MT SOLN: 15.0000 mL | Freq: Once | OROMUCOSAL | Status: AC

## 2021-06-27 MED ORDER — ACETAMINOPHEN 160 MG/5ML PO SOLN: 650.0000 mg | ORAL | Status: DC | PRN

## 2021-06-27 MED ORDER — ACETAMINOPHEN 500 MG PO TABS
1000.0000 mg | ORAL_TABLET | Freq: Once | ORAL | Status: AC
  Administered 2021-06-27: 1000 mg via ORAL
  Filled 2021-06-27: qty 2

## 2021-06-27 MED ORDER — SODIUM CHLORIDE 0.9 % IV SOLN: INTRAVENOUS | Status: DC

## 2021-06-27 MED ORDER — FENTANYL CITRATE (PF) 250 MCG/5ML IJ SOLN
INTRAMUSCULAR | Status: AC
  Filled 2021-06-27: qty 5

## 2021-06-27 MED ORDER — VERAPAMIL HCL 2.5 MG/ML IV SOLN
INTRAVENOUS | Status: AC
  Filled 2021-06-27: qty 2

## 2021-06-27 MED ORDER — ASPIRIN 81 MG PO CHEW: 324.0000 mg | CHEWABLE_TABLET | Freq: Every day | ORAL | Status: DC

## 2021-06-27 MED ORDER — NITROGLYCERIN 1 MG/10 ML FOR IR/CATH LAB
INTRA_ARTERIAL | Status: DC | PRN
  Administered 2021-06-27: 200 ug via INTRA_ARTERIAL

## 2021-06-27 MED ORDER — KETOROLAC TROMETHAMINE 30 MG/ML IJ SOLN
30.0000 mg | Freq: Three times a day (TID) | INTRAMUSCULAR | Status: DC | PRN
  Administered 2021-06-27 – 2021-06-28 (×2): 30 mg via INTRAVENOUS
  Filled 2021-06-27 (×2): qty 1

## 2021-06-27 MED ORDER — ONDANSETRON HCL 4 MG/2ML IJ SOLN
INTRAMUSCULAR | Status: DC | PRN
  Administered 2021-06-27: 4 mg via INTRAVENOUS

## 2021-06-27 MED ORDER — ASPIRIN 325 MG PO TABS
325.0000 mg | ORAL_TABLET | Freq: Every day | ORAL | Status: DC
  Administered 2021-06-28: 325 mg via ORAL
  Filled 2021-06-27: qty 1

## 2021-06-27 MED ORDER — SUGAMMADEX SODIUM 200 MG/2ML IV SOLN
INTRAVENOUS | Status: DC | PRN
  Administered 2021-06-27: 400 mg via INTRAVENOUS

## 2021-06-27 MED ORDER — CLONAZEPAM 0.5 MG PO TABS: 0.5000 mg | ORAL_TABLET | Freq: Three times a day (TID) | ORAL | Status: DC | PRN

## 2021-06-27 MED ORDER — HYDROXYZINE PAMOATE 50 MG PO CAPS: 50.0000 mg | ORAL_CAPSULE | ORAL | Status: DC

## 2021-06-27 MED ORDER — LASMIDITAN SUCCINATE 100 MG PO TABS: 1.0000 | ORAL_TABLET | Freq: Every day | ORAL | Status: DC | PRN

## 2021-06-27 MED ORDER — CHLORHEXIDINE GLUCONATE CLOTH 2 % EX PADS: 6.0000 | MEDICATED_PAD | Freq: Every day | CUTANEOUS | Status: DC

## 2021-06-27 MED ORDER — LIDOCAINE HCL (CARDIAC) PF 100 MG/5ML IV SOSY
PREFILLED_SYRINGE | INTRAVENOUS | Status: DC | PRN
  Administered 2021-06-27: 100 mg via INTRAVENOUS

## 2021-06-27 MED ORDER — ONDANSETRON HCL 4 MG PO TABS: 4.0000 mg | ORAL_TABLET | Freq: Three times a day (TID) | ORAL | Status: DC | PRN

## 2021-06-27 MED ORDER — HEPARIN SODIUM (PORCINE) 1000 UNIT/ML IJ SOLN
INTRAMUSCULAR | Status: DC | PRN
  Administered 2021-06-27: 2000 [IU] via INTRAVENOUS
  Administered 2021-06-27: 1000 [IU] via INTRAVENOUS

## 2021-06-27 MED ORDER — PHENYLEPHRINE HCL-NACL 20-0.9 MG/250ML-% IV SOLN
INTRAVENOUS | Status: DC | PRN
  Administered 2021-06-27: 20 ug/min via INTRAVENOUS

## 2021-06-27 MED ORDER — FENTANYL CITRATE (PF) 100 MCG/2ML IJ SOLN: 25.0000 ug | INTRAMUSCULAR | Status: DC | PRN

## 2021-06-27 MED ORDER — ROCURONIUM BROMIDE 100 MG/10ML IV SOLN
INTRAVENOUS | Status: DC | PRN
  Administered 2021-06-27: 30 mg via INTRAVENOUS
  Administered 2021-06-27: 20 mg via INTRAVENOUS
  Administered 2021-06-27: 70 mg via INTRAVENOUS

## 2021-06-27 MED ORDER — MIDAZOLAM HCL 2 MG/2ML IJ SOLN
INTRAMUSCULAR | Status: AC
  Filled 2021-06-27: qty 2

## 2021-06-27 MED ORDER — CHLORHEXIDINE GLUCONATE 0.12 % MT SOLN
OROMUCOSAL | Status: AC
  Administered 2021-06-27: 15 mL via OROMUCOSAL
  Filled 2021-06-27: qty 15

## 2021-06-27 MED ORDER — HEPARIN (PORCINE) 25000 UT/250ML-% IV SOLN
900.0000 [IU]/h | INTRAVENOUS | Status: DC
  Filled 2021-06-27: qty 250

## 2021-06-27 MED ORDER — LIDOCAINE HCL 1 % IJ SOLN
INTRAMUSCULAR | Status: AC
  Filled 2021-06-27: qty 20

## 2021-06-27 MED ORDER — PROMETHAZINE HCL 25 MG/ML IJ SOLN: 6.2500 mg | INTRAMUSCULAR | Status: DC | PRN

## 2021-06-27 MED ORDER — HEPARIN (PORCINE) 25000 UT/250ML-% IV SOLN
500.0000 [IU]/h | INTRAVENOUS | Status: DC
  Administered 2021-06-27: 500 [IU]/h via INTRAVENOUS
  Filled 2021-06-27: qty 250

## 2021-06-27 MED ORDER — HEPARIN SODIUM (PORCINE) 1000 UNIT/ML IJ SOLN
INTRAMUSCULAR | Status: AC
  Filled 2021-06-27: qty 1

## 2021-06-27 MED ORDER — FENTANYL CITRATE (PF) 100 MCG/2ML IJ SOLN
INTRAMUSCULAR | Status: DC | PRN
  Administered 2021-06-27 (×2): 50 ug via INTRAVENOUS

## 2021-06-27 MED ORDER — CLEVIDIPINE BUTYRATE 0.5 MG/ML IV EMUL
INTRAVENOUS | Status: AC
  Filled 2021-06-27: qty 50

## 2021-06-27 MED ORDER — ACETAMINOPHEN 650 MG RE SUPP: 650.0000 mg | RECTAL | Status: DC | PRN

## 2021-06-27 MED ORDER — ASPIRIN EC 325 MG PO TBEC: 325.0000 mg | DELAYED_RELEASE_TABLET | ORAL | Status: DC

## 2021-06-27 MED ORDER — NIMODIPINE 30 MG PO CAPS: 0.0000 mg | ORAL_CAPSULE | ORAL | Status: DC

## 2021-06-27 MED ORDER — VERAPAMIL HCL 2.5 MG/ML IV SOLN: INTRA_ARTERIAL | Status: DC | PRN

## 2021-06-27 MED ORDER — ACETAMINOPHEN 325 MG PO TABS
650.0000 mg | ORAL_TABLET | ORAL | Status: DC | PRN
  Administered 2021-06-27 – 2021-06-28 (×3): 650 mg via ORAL
  Filled 2021-06-27 (×3): qty 2

## 2021-06-27 MED ORDER — EPTIFIBATIDE 20 MG/10ML IV SOLN
INTRAVENOUS | Status: AC
  Filled 2021-06-27: qty 10

## 2021-06-27 MED ORDER — HYDROCODONE-ACETAMINOPHEN 10-325 MG PO TABS
1.0000 | ORAL_TABLET | Freq: Three times a day (TID) | ORAL | Status: DC | PRN
  Administered 2021-06-27 – 2021-06-28 (×2): 1 via ORAL
  Filled 2021-06-27 (×2): qty 1

## 2021-06-27 MED ORDER — LIDOCAINE HCL (PF) 1 % IJ SOLN
INTRAMUSCULAR | Status: DC | PRN
  Administered 2021-06-27: 10 mL

## 2021-06-27 MED ORDER — PANTOPRAZOLE SODIUM 40 MG PO TBEC
40.0000 mg | DELAYED_RELEASE_TABLET | Freq: Every day | ORAL | Status: DC
  Administered 2021-06-27 – 2021-06-28 (×2): 40 mg via ORAL
  Filled 2021-06-27 (×2): qty 1

## 2021-06-27 MED ORDER — PROPOFOL 10 MG/ML IV BOLUS
INTRAVENOUS | Status: DC | PRN
  Administered 2021-06-27: 200 mg via INTRAVENOUS

## 2021-06-27 NOTE — H&P (Signed)
Chief Complaint: Patient was seen in consultation today for Cerebral angiogram with possible angioplasty/stent sigmoid sinus stenosis at the request of Dr Liborio Nixon   Supervising Physician: Luanne Bras  Patient Status: [facility] - Out-pt  History of Present Illness: Virginia Douglas is a 45 y.o. female   Was seen in consultation with Dr Estanislado Pandy 06/02/21 for Bilateral pulsatile tinnitus Debilitating ;loses sleep; affects work and life Seems right worse than left--But both are bad Still with headache almost daily Denies N/V No speech changes No numbness or tingling Gait steady  Arteriogram 05/31/21:  Severe stenosis in the proximal left sigmoid sinus and probably the right sigmoid sinus proximally.  Dr Estanislado Pandy Consult note:  It was felt this was probably responsible for her severe debilitating pulsatile tinnitus. Failure of medical management and medication, the option of endovascular revascularization of the stenosed dural sigmoid sinuses was discussed in detail. Procedure would be under general anesthesia via the trans venous and transarterial diagnostic approach. One side would be treated first depending on the severity of the stenosis on diagnostic venograms. The patient would have to be started on dual antiplatelets at least 10 days prior to the procedure. The patient would stay overnight in the neuro ICU with subsequent management plan at that time.  Started ASA 81 and Plavix 75 mg  approx 2 weeks ago per Dr Estanislado Pandy [id number]  104  06/20/21  Scheduled now for NIR procedure   Past Medical History:  Diagnosis Date   ADHD (attention deficit hyperactivity disorder)    Anemia    iron deficiency   as a teenager   Anxiety    Arthritis    Diabetes ([place])    Edema    GERD (gastroesophageal reflux disease)    Hypertension    patient denies- patient stated that the PCP said it was for heart prevention.   Migraine headache     Past Surgical History:   Procedure Laterality Date   ABDOMINAL HYSTERECTOMY     CHOLECYSTECTOMY     COLONOSCOPY  12/28/2020   polyps   ESOPHAGEAL DILATION     IR ANGIO INTRA EXTRACRAN SEL COM CAROTID INNOMINATE BILAT MOD SED  05/31/2021   IR ANGIO VERTEBRAL SEL VERTEBRAL UNI R MOD SED  05/31/2021   IR RADIOLOGIST EVAL & MGMT  06/03/2021   IR US GUIDE VASC ACCESS RIGHT  05/31/2021   OVARIAN CYST REMOVAL      Allergies: Patient has no known allergies.  Medications: Prior to Admission medications   Medication Sig Start Date End Date Taking? Authorizing Provider  acetaZOLAMIDE (DIAMOX) 250 MG tablet TAKE 1 TABLET BY MOUTH TWICE A DAY Patient taking differently: Take 500 mg by mouth daily. 03/23/21  Yes Jaffe, Adam R, DO  ADDERALL XR 30 MG 24 hr capsule Take 30 mg by mouth daily. 09/29/20  Yes [provider]  aspirin EC 81 MG tablet Take 81 mg by mouth daily. Swallow whole.   Yes [provider]  atorvastatin (LIPITOR) 10 MG tablet TAKE 1 TABLET BY MOUTH EVERY DAY 04/26/21  Yes [name], FNP  Biotin (BIOTIN 5000) 5 MG CAPS Take 0.5 mg by mouth.   Yes [provider]  buPROPion (WELLBUTRIN XL) 150 MG 24 hr tablet TAKE 1 TABLET BY MOUTH EVERY DAY IN THE MORNING 05/26/21  Yes [name], FNP  cetirizine (ZYRTEC) 10 MG tablet Take 10 mg by mouth daily as needed for allergies.   Yes [provider]  clonazePAM (KLONOPIN) 0.5 MG tablet TAKE 1  TABLET BY MOUTH THREE TIMES A DAY AS NEEDED FOR ANXIETY 06/10/21  Yes [name], FNP  clopidogrel (PLAVIX) 75 MG tablet Take 1 tablet (75 mg total) by mouth daily. 06/13/21 10/11/21 Yes Covington, Roselyn Reef R, NP  DULoxetine (CYMBALTA) 60 MG capsule TAKE 1 CAPSULE BY MOUTH EVERY DAY 06/02/21  Yes Jaffe, Adam R, DO  famotidine (PEPCID) 20 MG tablet Take 1 tablet (20 mg total) by mouth 2 (two) times daily. Patient taking differently: Take 20 mg by mouth daily as needed for heartburn or indigestion. 10/12/20  Yes Khatri, Hina, PA-C   Fremanezumab-vfrm (AJOVY) 225 MG/1.5ML SOAJ Inject 225 mg into the skin every 28 (twenty-eight) days. 01/28/21  Yes Tomi Likens, Adam R, DO  HYDROcodone-acetaminophen (NORCO) 10-325 MG tablet Take 1 tablet by mouth every 6 (six) hours as needed for severe pain. 09/17/20  Yes [provider]  hydrOXYzine (VISTARIL) 50 MG capsule TAKE 1-2 CAPSULES BY MOUTH AS NEEDED FOR SLEEP AT BEDTIME 06/08/21  Yes [name], FNP  Lasmiditan Succinate (REYVOW) 100 MG TABS Take 1 tablet by mouth daily as needed. 12/31/19  Yes Jaffe, Adam R, DO  Lifitegrast (XIIDRA) 5 % SOLN Place 1 drop into both eyes daily.   Yes [provider]  linaclotide (LINZESS) 72 MCG capsule Take 72 mcg by mouth daily as needed.   Yes [provider]  lisinopril-hydrochlorothiazide (ZESTORETIC) 10-12.5 MG tablet TAKE 1 TABLET BY MOUTH EVERY DAY 04/25/21  Yes Glendale Chard, MD  Multiple Vitamins-Minerals (MULTIPLE VITAMINS/WOMENS PO) Take 1 tablet by mouth daily. Gummy   Yes [provider]  niacin 50 MG tablet Take 50 mg by mouth 3 (three) times a week.   Yes [provider]  omeprazole (PRILOSEC) 40 MG capsule Take 40 mg by mouth daily.   Yes [provider]  ondansetron (ZOFRAN) 4 MG tablet Take 4 mg by mouth every 8 (eight) hours as needed for nausea or vomiting.   Yes [provider]  saccharomyces boulardii (FLORASTOR) 250 MG capsule Take 250 mg by mouth daily. gummy   Yes [provider]  Semaglutide, 1 MG/DOSE, (OZEMPIC, 1 MG/DOSE,) 4 MG/3ML SOPN Inject 1 mg into the skin once a week. Patient taking differently: Inject 1 mg into the skin every Sunday. 05/05/21  Yes [name], FNP  topiramate (TOPAMAX) 50 MG tablet TAKE 2 AND 1/2 TABLETS (125 MG TOTAL) BY MOUTH AT BEDTIME. 04/07/21  Yes Jaffe, Adam R, DO  TURMERIC PO Take 1,000 mg by mouth daily.   Yes [provider]  Vitamin D, Ergocalciferol, (DRISDOL) 1.25 MG (50000 UNIT) CAPS capsule TAKE 1 CAPSULE (50,000  UNITS TOTAL) BY MOUTH EVERY 7 (SEVEN) DAYS Patient taking differently: Take 50,000 Units by mouth every Sunday. 04/04/21  Yes [name], FNP  Blood Glucose Monitoring Suppl (ONETOUCH VERIO IQ SYSTEM) w/Device KIT by Does not apply route. Use to check blood sugars twice    [provider]  eletriptan (RELPAX) 40 MG tablet TAKE 1 TABLET AS NEEDED FOR MIGRAINE/ HEADACHE. MAY REPEAT IN 2 HRS IF HEADACHE PERSISTS OR RECURS. Patient not taking: No sig reported 09/22/20   Metta Clines R, DO  glucose blood test strip 1 each by Other route as needed for other. Insert 1 by subcutaneous route 2 times every day check blood sugar before breakfast and dinner    [provider]  Insulin Pen Needle (PEN NEEDLES) 32G X 4 MM MISC 1 each by Does not apply route once a week. 11/11/18   [name], [place]  phentermine 15 MG capsule Take 1 capsule (15 mg total) by mouth every morning. Patient taking differently: Take 15 mg by mouth every morning. Has not taken in a few months. 04/21/21   [name], FNP  metFORMIN (GLUCOPHAGE-XR) 500 MG 24 hr tablet Take 2 tablets (1,000 mg total) by mouth 2 (two) times daily. Patient not taking: Reported on 06/05/2019 03/10/16 08/15/19  [name], MD  Phentermine-Topiramate 11.25-69 MG CP24 Take 1 tablet by mouth daily. 02/04/19 04/07/19  [name], FNP     Family History  Problem Relation Age of Onset   Diabetes Mother    Hypertension Mother    Diabetes Father    Hypertension Father    Kidney failure Father    Hypertension Brother    Diabetes Maternal Grandmother    Pancreatic cancer Maternal Grandmother    Diabetes Paternal Grandmother    Breast cancer Paternal Grandmother    Stroke Paternal Grandfather    Cancer Maternal Aunt        breast   Breast cancer Maternal Aunt    Healthy Son    Healthy Son     Social History   Socioeconomic History   Marital status: Legally Separated    Spouse name: Jeneen Rinks   Number of children: 2   Years of  education: 16   Highest education level: Some college, no degree  Occupational History   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[employer]  Tobacco Use   Smoking status: Former    Packs/day: 0.50    Years: 25.00    Pack years: 12.50    Types: Cigarettes    Start date: 03/24/2019   Smokeless tobacco: Never   Tobacco comments:    06/24/21- quit over a year ago  Vaping Use   Vaping Use: Never used  Substance and Sexual Activity   Alcohol use: No    Alcohol/week: 0.0 standard drinks   Drug use: No   Sexual activity: Yes    Birth control/protection: Surgical  Other Topics Concern   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 She lives with her 2 children in a one story house. She drinks one large cup of coffee a day and an occasional tea or soda. She walks daily.      One story home      Social Determinants of Health   Financial Resource Strain: Not on file  Food Insecurity: Not on file  Transportation Needs: Not on file  Physical Activity: Not on file  Stress: Not on file  Social Connections: Not on file    Review of Systems: A 12 point ROS discussed and pertinent positives are indicated in the HPI above.  All other systems are negative.  Review of Systems  Constitutional:  Negative for activity change, fatigue, fever and unexpected weight change.  HENT:  Positive for tinnitus. Negative for trouble swallowing.   Eyes:  Positive for visual disturbance.  Respiratory:  Negative for cough and shortness of breath.   Cardiovascular:  Negative for chest pain.  Gastrointestinal:  Negative for abdominal pain, nausea and vomiting.  Musculoskeletal:  Negative for back pain and gait problem.  Neurological:  Positive for headaches. Negative for dizziness, tremors,  seizures, syncope, facial asymmetry, speech difficulty, weakness, light-headedness and  numbness.  Psychiatric/Behavioral:  Negative for behavioral problems and confusion.    Vital Signs: BP 132/83   Pulse 99   Temp 98 F (36.7 C)   Resp 17   Ht _0  (1.676 m)   Wt 230 lb (104.3 kg)   SpO2 95%   BMI 37.12 kg/m   Physical Exam Vitals reviewed.  HENT:     Mouth/Throat:     Mouth: Mucous membranes are moist.  Cardiovascular:     Rate and Rhythm: Normal rate and regular rhythm.     Heart sounds: Normal heart sounds.  Pulmonary:     Effort: Pulmonary effort is normal.     Breath sounds: Normal breath sounds.  Abdominal:     General: There is no distension.     Palpations: Abdomen is soft.     Tenderness: There is no abdominal tenderness.  Musculoskeletal:        General: Normal range of motion.     Right lower leg: No edema.     Left lower leg: No edema.  Skin:    General: Skin is warm.  Neurological:     Mental Status: She is alert and oriented to person, place, and time.  Psychiatric:        Mood and Affect: Mood normal.        Behavior: Behavior normal.        Thought Content: Thought content normal.        Judgment: Judgment normal.    Imaging: IR Radiologist Eval & Mgmt  Result Date: 06/03/2021 EXAM: ESTABLISHED PATIENT OFFICE VISIT CHIEF COMPLAINT: Patient with persistent severe generalized headaches with bilateral disabling pulsatile tinnitus. Current Pain Level: 1-10 HISTORY OF PRESENT ILLNESS: Patient is a 45 year old right handed lady who returns in follow-up to discuss the findings of her recent cerebral arteriogram of 05/31/2021. Briefly, the patient reports the presence of generalized severe debilitating headaches which started in 2019. She was started on Diamox on which she had been on for about 2 years without much relief of her headaches as per patient. Patient reports the incessant presence of debilitating bilateral pulsatile tinnitus seemingly worse in the  right side than the left side. Patient reports swishing sound punctuated with a pulsatile character. Of course this being constant it interferes and a quality of life and also her work. Patient is unable to concentrate at work. It has affected her sleeping pattern whereby she has found it difficult to go to sleep being frequently woken from sleep with the due to the pulsatile tinnitus. She continues to complain of a generalized headache which varies in intensity from 8/10 to 5/10. Sometimes the headaches are associated with visual blurring though has not experienced frank diplopia, or blindness in either eye. There is no associated nausea, vomiting, speech difficulties, limb weakness, paresthesias, ambulating difficulties, or of episodes of near loss of consciousness or of seizure-like activity. The patient reports no amelioration with medication. During her first episode 2 years ago lumbar puncture was suggested. However, the patient declined to have the procedure. Diagnosis * : Date . * : Anemia * : * : iron deficiency . * : Diabetes ([place]) * : . * : Edema * : . * : Migraine headache * : Past Surgical History: Procedure * Clinical features of benign intracranial hypertension as per neurology note in 2019. : Laterality * : Date . * Clinical features of benign intracranial hypertension as per neurology note in 2019. : ABDOMINAL HYSTERECTOMY * : * : . *  Clinical features of benign intracranial hypertension as per neurology note in 2019. : CHOLECYSTECTOMY * : * : . * Clinical features of benign intracranial hypertension as per neurology note in 2019. : COLONOSCOPY * : * : 12/28/2020 . * Clinical features of benign intracranial hypertension as per neurology note in 2019. : ESOPHAGEAL DILATION * : * : . * Clinical features of benign intracranial hypertension as per neurology note in 2019. : OVARIAN CYST REMOVAL * : * : Allergies: Patient has no known allergies. Medications: Prior to Admission medications Medication * :  Sig * : Start Date * : End Date * : Taking? * : Authorizing Provider acetaZOLAMIDE (DIAMOX) 250 MG tablet * : TAKE 1 TABLET BY MOUTH TWICE A DAY * : 03/23/21 * : * : * Tomi Likens, Adam R, DO ADDERALL XR 30 MG 24 hr capsule * : Take 30 mg by mouth daily. * : 09/29/20 * : * : * : [provider] atorvastatin (LIPITOR) 10 MG tablet * : TAKE 1 TABLET BY MOUTH EVERY DAY * : 04/26/21 * : * : * : [name], FNP Blood Glucose Monitoring Suppl (ONETOUCH VERIO IQ SYSTEM) w/Device KIT * : by Does not apply route. Use to check blood sugars twice * : * : * : * : [provider] buPROPion (WELLBUTRIN XL) 150 MG 24 hr tablet * : TAKE 1 TABLET BY MOUTH EVERY DAY IN THE MORNING * : 05/26/21 * : * : * : [name], FNP clonazePAM (KLONOPIN) 0.5 MG tablet * : TAKE 1 TABLET BY MOUTH THREE TIMES A DAY AS NEEDED FOR ANXIETY * : 03/17/20 * : * : * [name], FNP DULoxetine (CYMBALTA) 60 MG capsule * : TAKE 1 CAPSULE BY MOUTH EVERY DAY * : 02/02/21 * : * : * Tomi Likens, Adam R, DO eletriptan (RELPAX) 40 MG tablet * : TAKE 1 TABLET AS NEEDED FOR MIGRAINE/ HEADACHE. MAY REPEAT IN 2 HRS IF HEADACHE PERSISTS OR RECURS. * : 09/22/20 * : * : * Tomi Likens, Adam R, DO famotidine (PEPCID) 20 MG tablet * : Take 1 tablet (20 mg total) by mouth 2 (two) times daily. * : 10/12/20 * : * : * : Khatri, Hina, PA-C Fremanezumab-vfrm (AJOVY) 225 MG/1.5ML SOAJ * : Inject 225 mg into the skin every 28 (twenty-eight) days. * : 01/28/21 * : * : * Tomi Likens, Adam R, DO glucose blood test strip * : 1 each by Other route as needed for other. Insert 1 by subcutaneous route 2 times every day check blood sugar before breakfast and dinner * : * : * : * : [provider] HYDROcodone-acetaminophen (NORCO) 10-325 MG tablet * : Take 1 tablet by mouth as needed. * : 09/17/20 * : * : * : [provider] hydrOXYzine (VISTARIL) 50 MG capsule * : Take 1-2 capsules by mouth as needed for sleep at bedtime * : 04/21/21 * : * : * : [name], FNP Insulin  Pen Needle (PEN NEEDLES) 32G X 4 MM MISC * : 1 each by Does not apply route once a week. * : 11/11/18 * : * : * [name], FNP Lasmiditan Succinate (REYVOW) 100 MG TABS * : Take 1 tablet by mouth daily as needed. * : 12/31/19 * : * : * Tomi Likens, Adam R, DO lisinopril-hydrochlorothiazide (ZESTORETIC) 10-12.5 MG tablet * : TAKE 1 TABLET BY MOUTH EVERY DAY * : 04/25/21 * : * : * :  Glendale Chard, MD Lita Mains (ZYLET OP) * : Apply to eye. * : * : * : * : [provider] phentermine 15 MG capsule * : Take 1 capsule (15 mg total) by mouth every morning. * : 04/21/21 * : * : * [name], FNP Semaglutide, 1 MG/DOSE, (OZEMPIC, 1 MG/DOSE,) 4 MG/3ML SOPN * : Inject 1 mg into the skin once a week. * : 05/05/21 * : * : * [name], FNP topiramate (TOPAMAX) 50 MG tablet * : TAKE 2 AND 1/2 TABLETS (125 MG TOTAL) BY MOUTH AT BEDTIME. * : 04/07/21 * : * : * Tomi Likens, Adam R, DO TURMERIC PO * : Take 1 capsule by mouth daily. * : * : * : * : [provider] Vitamin D, Ergocalciferol, (DRISDOL) 1.25 MG (50000 UNIT) CAPS capsule * : TAKE 1 CAPSULE (50,000 UNITS TOTAL) BY MOUTH EVERY 7 (SEVEN) DAYS * : 04/04/21 * : * : * [name], FNP metFORMIN (GLUCOPHAGE-XR) 500 MG 24 hr tablet * : Take 2 tablets (1,000 mg total) by mouth 2 (two) times daily.Patient not taking: Reported on 06/05/2019 * : 03/10/16 * : 08/15/19 * : * : [name], MD Phentermine-Topiramate 11.25-69 MG CP24 * : Take 1 tablet by mouth daily. * : 02/04/19 * : 04/07/19 * : * : [name], FNP Family History Problem * : Relation * : Age of Onset . * : Diabetes * : Mother * : . * : Hypertension * : Mother * : . * : Diabetes * : Father * : . * : Hypertension * : Father * : . * : Kidney failure * : Father * : . * : Hypertension * : Brother * : . * : Diabetes * : Maternal Grandmother * : . * : Pancreatic cancer * : Maternal Grandmother * : . * : Diabetes * : Paternal Grandmother * : . * : Breast cancer * : Paternal Grandmother * : . * :  Stroke * : Paternal Grandfather * : . * : Cancer * : Maternal Aunt * : * :     breast . * : Breast cancer * : Maternal Aunt * : . * : Healthy * : Son * : . * : Healthy * : Son * : Socioeconomic History . * : Marital status: * : Legally Separated * : * : Spouse name: * : Jeneen Rinks . * : Number of children: * : 2 . * : Years of education: * : 15 . * : Highest education level: * : Some college, no degree Occupational History . * : Occupation: * : Web designer * : * : Employer: * : [employer] Tobacco Use . * : Smoking status: * : Former * : * : Packs/day: * : 0.50 * : * : Years: * : 25.00 * : * : Pack years: * : 12.50 * : * : Types: * : Cigarettes * : * : Start date: * : 03/24/2019 . * : Smokeless tobacco: * : Never Vaping Use . * : Vaping Use: * : Never used Substance and Sexual Activity . * : Alcohol use: * : No * : * : Alcohol/week: * : 0.0 standard drinks . * : Drug use: * : No . * : Sexual activity: * : Yes * : * : Birth control/protection: * : Surgical Other Topics * : Concern . 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 She lives with her 2 children in a one story house. She drinks one large cup of coffee a day and an occasional tea or soda. She walks daily. * : * : One story home * : Food Insecurity: Not on file Transportation Needs: Not on file Physical Activity: Not on file Stress: Not on file Social Connections: Not on file REVIEW OF SYSTEMS: There is a documented history of bilateral lower extremity paresthesias. Nerve conduction studies and an EMG study of 2017 was reportedly normal. Denies recent chest pain, shortness of breath, cough, wheezing, sputum production. Weight is steady. Normal appetite. No abdominal pain, constipation, diarrhea or melena. Denies any hematuria, polyuria, or  dysuria. PHYSICAL EXAMINATION: Grossly, the patient in moderate distress due to her constant persistent headache, and pulsatile tinnitus. Otherwise, alert, awake, oriented to time, place, space. Speech and comprehension normal. Normal affect. Normal eye contact. No gross lateralizing neurological abnormalities. ASSESSMENT AND PLAN: The patient's recent arteriogram findings were reviewed. She was informed of the severe stenosis in the proximal left sigmoid sinus and probably the right sigmoid sinus proximally. It was felt this was probably responsible for her severe debilitating pulsatile tinnitus. Failure of medical management and medication, the option of endovascular revascularization of the stenosed dural sigmoid sinuses was discussed in detail. Procedure would be under general anesthesia via the trans venous and transarterial diagnostic approach. One side would be treated first depending on the severity of the stenosis on diagnostic venograms. The patient would have to be started on dual antiplatelets at least 10 days prior to the procedure. The patient would stay overnight in the neuro ICU with subsequent management plan at that time. 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 It was unlikely that the patient's headaches were in any way related to this finding. Nevertheless, given the suggestion of slightly increased prominence compared to a study in 2017 a surgery consultation will be initiated following the treatment for her debilitating pulsatile tinnitus. This was discussed with the patient to which she is agreeable. Sigmoid sinus revascularization will be scheduled as soon as possible. The patient leaves with good understanding and agreement with the above management plan. She was asked to call should she have any concerns or questions. Electronically Signed   By:  Luanne Bras M.D.   On: 06/02/2021 15:38    Labs:  CBC: Recent Labs    07/15/20 1052 10/12/20 0633 05/31/21 0853  WBC 13.6* 9.6 14.2*  HGB 14.2 13.8 12.7  HCT 42.7 41.8 38.9  PLT 299 336 323    COAGS: Recent Labs    05/31/21 0853  INR 1.0  APTT 32    BMP: Recent Labs    07/15/20 1052 10/12/20 0633 04/21/21 0947 05/31/21 0853  NA 137 137 139 138  K 3.6 3.3* CANCELED 3.6  CL 101 104 102 104  CO2 17* 19* 19* 25  GLUCOSE 97 106* 90 96  BUN _0 CALCIUM 10.2 9.8 9.6 9.6  CREATININE 0.91 0.65 0.76 0.64  GFRNONAA 78 >60  --  >60  GFRAA 89  --   --   --  LIVER FUNCTION TESTS: Recent Labs    07/15/20 1052 10/12/20 0633 12/06/20 0921  BILITOT 0.4 0.4  --   AST 20 21  --   ALT 22 18  --   ALKPHOS 115 107  --   PROT 7.6 7.9 6.5  ALBUMIN 4.9* 4.4  --     TUMOR MARKERS: No results for input(s): AFPTM, CEA, CA199, CHROMGRNA in the last 8760 hours.  Assessment and Plan:  Bilateral tinnitus Debilitating and life interrupting Bilateral sigmoid sinus stenosis per imaging Scheduled today for cerebral angiogram with sigmoid sinus angioplasty/stent placement today with Dr Dellia Beckwith Risks and benefits of cerebral angiogram with intervention were discussed with the patient including, but not limited to bleeding, infection, vascular injury, contrast induced renal failure, stroke or even death.  This interventional procedure involves the use of X-rays and because of the nature of the planned procedure, it is possible that we will have prolonged use of X-ray fluoroscopy.  Potential radiation risks to you include (but are not limited to) the following: - A slightly elevated risk for cancer  several years later in life. This risk is typically less than 0.5% percent. This risk is low in comparison to the normal incidence of human cancer, which is 33% for women and 50% for men according to the [place]. - Radiation induced injury can include  skin redness, resembling a rash, tissue breakdown / ulcers and hair loss (which can be temporary or permanent).   The likelihood of either of these occurring depends on the difficulty of the procedure and whether you are sensitive to radiation due to previous procedures, disease, or genetic conditions.   IF your procedure requires a prolonged use of radiation, you will be notified and given written instructions for further action.  It is your responsibility to monitor the irradiated area for the 2 weeks following the procedure and to notify your physician if you are concerned that you have suffered a radiation induced injury.    All of the patient's questions were answered, patient is agreeable to proceed.  Consent signed and in chart.  Pt is aware, if intervention is performed she will be admitted overnight  in [facility] ICU and planned for discharge in morning. She is agreeable to proceed.    Thank you for this interesting consult.  I greatly enjoyed meeting Meaghann Choo [place] and look forward to participating in their care.  A copy of this report was sent to the requesting provider on this date.  Electronically Signed: Lavonia Drafts, PA-C 06/27/2021, 7:02 AM   I spent a total of    25 Minutes in face to face in clinical consultation, greater than 50% of which was counseling/coordinating care for Cerebral angiogram with sigmoid sinus revascularization

## 2021-06-27 NOTE — Sedation Documentation (Signed)
Right radial arterial sheath removed. TR band applied with 11 cc of air at 1110am.

## 2021-06-27 NOTE — Transfer of Care (Signed)
Immediate Anesthesia Transfer of Care Note  Patient: Virginia Douglas  Procedure(s) Performed: STENTING  Patient Location: PACU  Anesthesia Type:General  Level of Consciousness: drowsy  Airway & Oxygen Therapy: Patient Spontanous Breathing and Patient connected to nasal cannula oxygen  Post-op Assessment: Report given to RN and Post -op Vital signs reviewed and stable  Post vital signs: Reviewed and stable  Last Vitals:  Vitals Value Taken Time  BP 108/70 06/27/21 1140  Temp    Pulse 88 06/27/21 1145  Resp 16 06/27/21 1145  SpO2 98 % 06/27/21 1145  Vitals shown include unvalidated device data.  Last Pain:  Vitals:   06/27/21 0707  PainSc: 7       Patients Stated Pain Goal: 4 (90/21/11 5520)  Complications: No notable events documented.

## 2021-06-27 NOTE — Progress Notes (Signed)
[place] for heparin Indication:  post-interventional neuroradiology procedure  Heparin Dosing Weight: 83.2 kg  Labs: Recent Labs    06/27/21 0720  HGB 13.1  HCT 40.1  PLT 329  LABPROT 12.9  INR 1.0  CREATININE 0.62    Assessment: 76 yof presenting for cerebral angiogram with possible angioplasty/stent sigmoid sinus stenosis. Patient is not on anticoagulation PTA. CBC wnl. No active bleed issues reported per discussion with RN.  Goal of Therapy:  Heparin level 0.1-0.25 units/ml Monitor platelets by anticoagulation protocol: Yes   Plan:  No bolus. Increase heparin to 800 units/hr per protocol Check 6hr heparin level Monitor daily CBC, s/sx bleeding Heparin off at 0800 per protocol   Arturo Morton, PharmD, BCPS Clinical Pharmacist 06/27/2021 1:23 PM

## 2021-06-27 NOTE — Anesthesia Postprocedure Evaluation (Signed)
Anesthesia Post Note  Patient: Virginia Douglas  Procedure(s) Performed: STENTING     Patient location during evaluation: PACU Anesthesia Type: General Level of consciousness: awake and alert and oriented Pain management: pain level controlled Vital Signs Assessment: post-procedure vital signs reviewed and stable Respiratory status: spontaneous breathing, nonlabored ventilation and respiratory function stable Cardiovascular status: blood pressure returned to baseline Postop Assessment: no apparent nausea or vomiting Anesthetic complications: no   No notable events documented.  Last Vitals:  Vitals:   06/27/21 1300 06/27/21 1315  BP: 106/68   Pulse: 94 97  Resp: 12 14  Temp:    SpO2: 95% 95%    Last Pain:  Vitals:   06/27/21 1300  PainSc: [place]

## 2021-06-27 NOTE — Progress Notes (Signed)
Patient transported to [facility] room 26. Receiving RN at bedside, no questions or concerns. VS stable. Family notified.  Rowe Pavy, RN

## 2021-06-27 NOTE — Progress Notes (Signed)
[place] for heparin Indication:  post-interventional neuroradiology procedure  Heparin Dosing Weight: 83.2 kg  Labs: Recent Labs    06/27/21 0720 06/27/21 2155  HGB 13.1  --   HCT 40.1  --   PLT 329  --   LABPROT 12.9  --   INR 1.0  --   HEPARINUNFRC  --  <0.10*  CREATININE 0.62  --      Assessment: 3 yof presenting for cerebral angiogram with possible angioplasty/stent sigmoid sinus stenosis. Patient is not on anticoagulation PTA. CBC wnl. No active bleed issues reported per discussion with RN.  PM f/u > heparin level undetectable on 800 units/hr.  No known issues with IV infusion, no overt bleeding or complications noted.  Goal of Therapy:  Heparin level 0.1-0.25 units/ml Monitor platelets by anticoagulation protocol: Yes   Plan:  No bolus. Increase heparin to 900 units/hr per protocol Check 6hr heparin level Monitor daily CBC, s/sx bleeding Heparin off at 0800 per protocol   [place] Crane, Vena Austria, BCPS, [facility] Clinical Pharmacist  06/27/2021 10:37 PM   [facility] pharmacy phone numbers are listed on [email or web address]

## 2021-06-27 NOTE — Anesthesia Procedure Notes (Signed)
Arterial Line Insertion Start/End10/24/2022 8:00 AM, 06/27/2021 8:15 AM Performed by: Brennan Bailey, MD, Vonna Drafts, CRNA, CRNA  Patient location: Pre-op. Preanesthetic checklist: patient identified, IV checked, site marked, risks and benefits discussed, surgical consent, monitors and equipment checked, pre-op evaluation, timeout performed and anesthesia consent Lidocaine 1% used for infiltration and patient sedated Left, radial was placed Catheter size: 20 G Hand hygiene performed  and maximum sterile barriers used   Attempts: 1 Procedure performed without using ultrasound guided technique. Following insertion, Biopatch and dressing applied. Post procedure assessment: normal  Patient tolerated the procedure well with no immediate complications.

## 2021-06-27 NOTE — Progress Notes (Signed)
45 y.o. female inpatient. History of HTN, DM CAD s/p CABG, left MCA/ACA and MCA/PCA infarcts in 2019. Presented to the ED at [facility] on 10.22.22 with persistent and worsening right hand and foot numbness X 3 days with lightheadedness and headaches. CT Angio neck from 10.22.22 reads Diminutive left common carotid artery and internal carotid artery, which have decreased in size slightly since prior exam, possibly due to atherosclerotic disease. The intracranial left internal carotid artery is only intermittently minimally opacified past the cavernous segment and is completely non-opacified at the terminus, which is new from the prior exam. 2. Occlusion of the proximal left MCA. Mild contrast is seen in the expected location of more distal MCA segment, which could be secondary to collaterals or may reflect venous opacification, new from the prior exam. 3. Nonvisualization of the majority of the left A1, unchanged. Otherwise normal ACAs. 4. Occlusion of the left V4, distal to the takeoff of the left PICA, new from the prior exam. 5. Redemonstrated severe stenosis at the origin of the left external carotid artery.  NIR performed a four vessel cerebral arteriogram via a right CFA approach.  Findings. 1.Occluded LT  ICA just distal to Lt ophthalmic artery. 2.Prominent  LT anterior choroidal A. 3.Occluded LT VA at LT PICA.  4.60 to 70 % stenosis LT VA just  distal to origin.  Patient seen at bedside with Dr. Estanislado Pandy. Patient reports a "throbbing left sided headache that she rated 7/10/ Ms Lanae Boast was given tylenol and reports an improvement in her headache that she states is now"dull ad rates her pain 3/10.  Alert, aware and oriented X 3 Speech and comprehension is intact.  PERRL bilaterally No facial droop noted Tongue midline Can spontaneously move all 4 extremities. Hand grip strength equal bilaterally. Right dorsiflexion 5/5 bilaterally. Plantar flection 5/5 bilaterally.  Fine motor and coordination slow  but intact.   Speech, cognition and language  are generally intact.  Comprehension and fluency are normal.  Judgment and insight normal  Negative pronator drift. Fine motor and coordination grossly in tact Gait not assessed Romberg not assessed Heel to toe not assessed Distal pulses not assessed  Patient reports tenderness to right femoral access site. Site is soft with no active bleeding and no appreciable pseudoaneurysm. Dressing is C/D/I

## 2021-06-27 NOTE — Anesthesia Procedure Notes (Signed)
Procedure Name: Intubation Date/Time: 06/27/2021 8:54 AM Performed by: Vonna Drafts, CRNA Pre-anesthesia Checklist: Patient identified, Emergency Drugs available, Suction available and Patient being monitored Patient Re-evaluated:Patient Re-evaluated prior to induction Oxygen Delivery Method: Circle system utilized Preoxygenation: Pre-oxygenation with 100% oxygen Induction Type: IV induction Ventilation: Mask ventilation without difficulty Laryngoscope Size: Mac and 3 Grade View: Grade I Tube type: Oral Tube size: 7.0 mm Number of attempts: 1 Airway Equipment and Method: Stylet and Oral airway Placement Confirmation: ETT inserted through vocal cords under direct vision, positive ETCO2 and breath sounds checked- equal and bilateral Secured at: 22 cm Tube secured with: Tape Dental Injury: Teeth and Oropharynx as per pre-operative assessment

## 2021-06-27 NOTE — Procedures (Addendum)
S/P Lt common carotid and Lt  sogmoid sinus and RT transverse sinus venograms followed by stent assisted angioplasty of severe Lt sigmoid sinus. Severe RT sigmoid sinus stenosis also noted. Post CT No ICH  Extubated . Denies any H/A. Says throat hurts. Pupils 2 to 3 mm Rt = Lt No facial asymmetry. Tongue midline. Moved all 4s to command and spontaneously. RT CFV  puncture site hemostasis with manual compression and quick clot. Wrist band to Rt rad access site. S.Lincy Belles MD

## 2021-06-28 ENCOUNTER — Encounter (HOSPITAL_COMMUNITY): Payer: Self-pay | Admitting: Interventional Radiology

## 2021-06-28 LAB — CBC WITH DIFFERENTIAL/PLATELET
Abs Immature Granulocytes: 0.09 10*3/uL — ABNORMAL HIGH (ref 0.00–0.07)
Basophils Absolute: 0 10*3/uL (ref 0.0–0.1)
Basophils Relative: 0 %
Eosinophils Absolute: 0 10*3/uL (ref 0.0–0.5)
Eosinophils Relative: 0 %
HCT: 34.9 % — ABNORMAL LOW (ref 36.0–46.0)
Hemoglobin: 11.5 g/dL — ABNORMAL LOW (ref 12.0–15.0)
Immature Granulocytes: 1 %
Lymphocytes Relative: 18 %
Lymphs Abs: 2.8 10*3/uL (ref 0.7–4.0)
MCH: 27.8 pg (ref 26.0–34.0)
MCHC: 33 g/dL (ref 30.0–36.0)
MCV: 84.5 fL (ref 80.0–100.0)
Monocytes Absolute: 1.1 10*3/uL — ABNORMAL HIGH (ref 0.1–1.0)
Monocytes Relative: 7 %
Neutro Abs: 11.9 10*3/uL — ABNORMAL HIGH (ref 1.7–7.7)
Neutrophils Relative %: 74 %
Platelets: 302 10*3/uL (ref 150–400)
RBC: 4.13 MIL/uL (ref 3.87–5.11)
RDW: 15.8 % — ABNORMAL HIGH (ref 11.5–15.5)
WBC: 15.9 10*3/uL — ABNORMAL HIGH (ref 4.0–10.5)
nRBC: 0 % (ref 0.0–0.2)

## 2021-06-28 LAB — BASIC METABOLIC PANEL
Anion gap: 8 (ref 5–15)
BUN: 6 mg/dL (ref 6–20)
CO2: 20 mmol/L — ABNORMAL LOW (ref 22–32)
Calcium: 8.5 mg/dL — ABNORMAL LOW (ref 8.9–10.3)
Chloride: 110 mmol/L (ref 98–111)
Creatinine, Ser: 0.56 mg/dL (ref 0.44–1.00)
GFR, Estimated: >60 mL/min (ref >60)
Glucose, Bld: 160 mg/dL — ABNORMAL HIGH (ref 70–99)
Potassium: 3.3 mmol/L — ABNORMAL LOW (ref 3.5–5.1)
Sodium: 138 mmol/L (ref 135–145)

## 2021-06-28 MED ORDER — BUTALBITAL-APAP-CAFFEINE 50-325-40 MG PO TABS
1.0000 | ORAL_TABLET | Freq: Four times a day (QID) | ORAL | Status: DC | PRN
  Administered 2021-06-28: 1 via ORAL
  Filled 2021-06-28: qty 1

## 2021-06-28 NOTE — Discharge Summary (Signed)
Patient ID: Virginia Douglas MRN: [id number] DOB/AGE: 44/30/1977 43 y.o.  Admit date: 06/27/2021 Discharge date: 06/28/2021  Supervising Physician: Luanne Bras  Patient Status: [facility] - In-pt  Admission Diagnoses: Intracranial hypertension S/p left common carotid, left sigmoid sinus and right transverse sinus venograms followed by stent-assisted angioplasty of severe left sigmoid sinus stenosis.   Discharge Diagnoses:  Active Problems:   Pulsatile tinnitus, bilateral  Discharged Condition: good  Hospital Course: Patient was initially referred to Neuro Interventional Radiology for evaluation of migraines and an MRI that was positive for a left superior cerebellar lesion with enlargement since prior imaging in 2017. She underwent a diagnostic cerebral angiogram with Dr. Estanislado Pandy 05/31/21 with identification of a left PCA AVM, high grade left sigmoid sinus stenosis and moderate right sigmoid sinus stenosis.   She presented to [facility] 06/27/21 for an elective cerebral angiogram with possible intervention and this was done under general anesthesia with planned overnight observation. Dr. Estanislado Pandy performed left common carotid, left sigmoid sinus and right transverse sinus venograms followed by stent-assisted angioplasty of severe left sigmoid sinus stenosis. This was done via right common femoral vein and right radial artery. She was extubated post-procedure and admitted to the Neuro ICU for overnight observation.   The patient was assessed this morning at the bedside with Dr. Estanislado Pandy. She was sitting up in bed eating breakfast. She complained of a severe headache overnight which she rated a 10/10 at times. PRN pain medicines helped a little but not enough for the patient to get a good night's rest. She said the pain felt similar to her experiences pre-procedure. She was advised that a strong headache is not uncommon following a cerebral intervention and a trial dose of  Fioricet was ordered for this morning.  The patient was discharged home today around 12 pm. She tolerated her meals, ambulated in the room, voided post foley removal and reported an improvement in her headache following the dose of Fioricet. She was advised to let her neurologist know about her response to Fioricet.   [name] will follow up with Dr. Estanislado Pandy in approximately two weeks for post-procedure follow up and to discuss possible intervention for the right sigmoid sinus stenosis; possible referral to a specialist for treatment of the left PCA AVM.  A scheduler from our office will call her with a date/time. She was advised to speak with her PCP regarding her current medication regimen to discuss possible elimination of any unnecessary medications. She knows to continue taking Plavix and Aspirin daily. She was advised to call our office with any questions/concerns.   Consults: None  Significant Diagnostic Studies: IR Radiologist Eval & Mgmt  Result Date: 06/03/2021 EXAM: ESTABLISHED PATIENT OFFICE VISIT CHIEF COMPLAINT: Patient with persistent severe generalized headaches with bilateral disabling pulsatile tinnitus. Current Pain Level: 1-10 HISTORY OF PRESENT ILLNESS: Patient is a 45 year old right handed lady who returns in follow-up to discuss the findings of her recent cerebral arteriogram of 05/31/2021. Briefly, the patient reports the presence of generalized severe debilitating headaches which started in 2019. She was started on Diamox on which she had been on for about 2 years without much relief of her headaches as per patient. Patient reports the incessant presence of debilitating bilateral pulsatile tinnitus seemingly worse in the right side than the left side. Patient reports swishing sound punctuated with a pulsatile character. Of course this being constant it interferes and a quality of life and also her work. Patient is unable to concentrate at work. It  has affected her sleeping  pattern whereby she has found it difficult to go to sleep being frequently woken from sleep with the due to the pulsatile tinnitus. She continues to complain of a generalized headache which varies in intensity from 8/10 to 5/10. Sometimes the headaches are associated with visual blurring though has not experienced frank diplopia, or blindness in either eye. There is no associated nausea, vomiting, speech difficulties, limb weakness, paresthesias, ambulating difficulties, or of episodes of near loss of consciousness or of seizure-like activity. The patient reports no amelioration with medication. During her first episode 2 years ago lumbar puncture was suggested. However, the patient declined to have the procedure. Diagnosis * : Date . * : Anemia * : * : iron deficiency . * : Diabetes ([place]) * : . * : Edema * : . * : Migraine headache * : Past Surgical History: Procedure * Clinical features of benign intracranial hypertension as per neurology note in 2019. : Laterality * : Date . * Clinical features of benign intracranial hypertension as per neurology note in 2019. : ABDOMINAL HYSTERECTOMY * : * : . * Clinical features of benign intracranial hypertension as per neurology note in 2019. : CHOLECYSTECTOMY * : * : . * Clinical features of benign intracranial hypertension as per neurology note in 2019. : COLONOSCOPY * : * : 12/28/2020 . * Clinical features of benign intracranial hypertension as per neurology note in 2019. : ESOPHAGEAL DILATION * : * : . * Clinical features of benign intracranial hypertension as per neurology note in 2019. : OVARIAN CYST REMOVAL * : * : Allergies: Patient has no known allergies. Medications: Prior to Admission medications Medication * : Sig * : Start Date * : End Date * : Taking? * : Authorizing Provider acetaZOLAMIDE (DIAMOX) 250 MG tablet * : TAKE 1 TABLET BY MOUTH TWICE A DAY * : 03/23/21 * : * : * Tomi Likens, Adam R, DO ADDERALL XR 30 MG 24 hr capsule * : Take 30 mg by mouth daily. * :  09/29/20 * : * : * : [provider] atorvastatin (LIPITOR) 10 MG tablet * : TAKE 1 TABLET BY MOUTH EVERY DAY * : 04/26/21 * : * : * : [name], FNP Blood Glucose Monitoring Suppl (ONETOUCH VERIO IQ SYSTEM) w/Device KIT * : by Does not apply route. Use to check blood sugars twice * : * : * : * : [provider] buPROPion (WELLBUTRIN XL) 150 MG 24 hr tablet * : TAKE 1 TABLET BY MOUTH EVERY DAY IN THE MORNING * : 05/26/21 * : * : * : [name], FNP clonazePAM (KLONOPIN) 0.5 MG tablet * : TAKE 1 TABLET BY MOUTH THREE TIMES A DAY AS NEEDED FOR ANXIETY * : 03/17/20 * : * : * [name], FNP DULoxetine (CYMBALTA) 60 MG capsule * : TAKE 1 CAPSULE BY MOUTH EVERY DAY * : 02/02/21 * : * : * Tomi Likens, Adam R, DO eletriptan (RELPAX) 40 MG tablet * : TAKE 1 TABLET AS NEEDED FOR MIGRAINE/ HEADACHE. MAY REPEAT IN 2 HRS IF HEADACHE PERSISTS OR RECURS. * : 09/22/20 * : * : * Tomi Likens, Adam R, DO famotidine (PEPCID) 20 MG tablet * : Take 1 tablet (20 mg total) by mouth 2 (two) times daily. * : 10/12/20 * : * : * : Khatri, Hina, PA-C Fremanezumab-vfrm (AJOVY) 225 MG/1.5ML SOAJ * : Inject 225 mg into the skin every 28 (twenty-eight) days. * :  01/28/21 * : * : * Tomi Likens, Adam R, DO glucose blood test strip * : 1 each by Other route as needed for other. Insert 1 by subcutaneous route 2 times every day check blood sugar before breakfast and dinner * : * : * : * : [provider] HYDROcodone-acetaminophen (NORCO) 10-325 MG tablet * : Take 1 tablet by mouth as needed. * : 09/17/20 * : * : * : [provider] hydrOXYzine (VISTARIL) 50 MG capsule * : Take 1-2 capsules by mouth as needed for sleep at bedtime * : 04/21/21 * : * : * : [name], FNP Insulin Pen Needle (PEN NEEDLES) 32G X 4 MM MISC * : 1 each by Does not apply route once a week. * : 11/11/18 * : * : * [name], FNP Lasmiditan Succinate (REYVOW) 100 MG TABS * : Take 1 tablet by mouth daily as needed. * : 12/31/19 * : * : * Tomi Likens, Adam  R, DO lisinopril-hydrochlorothiazide (ZESTORETIC) 10-12.5 MG tablet * : TAKE 1 TABLET BY MOUTH EVERY DAY * : 04/25/21 * : * : * : Glendale Chard, MD Loteprednol-Tobramycin (ZYLET OP) * : Apply to eye. * : * : * : * : [provider] phentermine 15 MG capsule * : Take 1 capsule (15 mg total) by mouth every morning. * : 04/21/21 * : * : * [name], FNP Semaglutide, 1 MG/DOSE, (OZEMPIC, 1 MG/DOSE,) 4 MG/3ML SOPN * : Inject 1 mg into the skin once a week. * : 05/05/21 * : * : * [name], FNP topiramate (TOPAMAX) 50 MG tablet * : TAKE 2 AND 1/2 TABLETS (125 MG TOTAL) BY MOUTH AT BEDTIME. * : 04/07/21 * : * : * Tomi Likens, Adam R, DO TURMERIC PO * : Take 1 capsule by mouth daily. * : * : * : * : [provider] Vitamin D, Ergocalciferol, (DRISDOL) 1.25 MG (50000 UNIT) CAPS capsule * : TAKE 1 CAPSULE (50,000 UNITS TOTAL) BY MOUTH EVERY 7 (SEVEN) DAYS * : 04/04/21 * : * : * [name], FNP metFORMIN (GLUCOPHAGE-XR) 500 MG 24 hr tablet * : Take 2 tablets (1,000 mg total) by mouth 2 (two) times daily.Patient not taking: Reported on 06/05/2019 * : 03/10/16 * : 08/15/19 * : * : [name], MD Phentermine-Topiramate 11.25-69 MG CP24 * : Take 1 tablet by mouth daily. * : 02/04/19 * : 04/07/19 * : * : [name], FNP Family History Problem * : Relation * : Age of Onset . * : Diabetes * : Mother * : . * : Hypertension * : Mother * : . * : Diabetes * : Father * : . * : Hypertension * : Father * : . * : Kidney failure * : Father * : . * : Hypertension * : Brother * : . * : Diabetes * : Maternal Grandmother * : . * : Pancreatic cancer * : Maternal Grandmother * : . * : Diabetes * : Paternal Grandmother * : . * : Breast cancer * : Paternal Grandmother * : . * : Stroke * : Paternal Grandfather * : . * : Cancer * : Maternal Aunt * : * :     breast . * : Breast cancer * : Maternal Aunt * : . * : Healthy * : Son * : . * : Healthy * : Son * : Socioeconomic History . * :  Marital status: * : Legally Separated * : * :  Spouse name: * : Jeneen Rinks . * : Number of children: * : 2 . * : Years of education: * : 79 . * : Highest education level: * : Some college, no degree Occupational History . * : Occupation: * : Web designer * : * : Employer: * : [employer] Tobacco Use . * : Smoking status: * : Former * : * : Packs/day: * : 0.50 * : * : Years: * : 25.00 * : * : Pack years: * : 12.50 * : * : Types: * : Cigarettes * : * : Start date: * : 03/24/2019 . * : Smokeless tobacco: * : Never Vaping Use . * : Vaping Use: * : Never used Substance and Sexual Activity . * : Alcohol use: * : No * : * : Alcohol/week: * : 0.0 standard drinks . * : Drug use: * : No . * : Sexual activity: * : Yes * : * : Birth control/protection: * : Surgical Other Topics * : Concern . 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 She lives with her 2 children in a one story house. She drinks one large cup of coffee a day and an occasional tea or soda. She walks daily. * : * : One story home * : Food Insecurity: Not on file Transportation Needs: Not on file Physical Activity: Not on file Stress: Not on file Social Connections: Not on file REVIEW OF SYSTEMS: There is a documented history of bilateral lower extremity paresthesias. Nerve conduction studies and an EMG study of 2017 was reportedly normal. Denies recent chest pain, shortness of breath, cough, wheezing, sputum production. Weight is steady. Normal appetite. No abdominal pain, constipation, diarrhea or melena. Denies any hematuria, polyuria, or dysuria. PHYSICAL EXAMINATION: Grossly, the patient in moderate distress due to her constant persistent headache, and pulsatile tinnitus. Otherwise, alert, awake, oriented to time, place, space. Speech and comprehension normal. Normal affect. Normal eye  contact. No gross lateralizing neurological abnormalities. ASSESSMENT AND PLAN: The patient's recent arteriogram findings were reviewed. She was informed of the severe stenosis in the proximal left sigmoid sinus and probably the right sigmoid sinus proximally. It was felt this was probably responsible for her severe debilitating pulsatile tinnitus. Failure of medical management and medication, the option of endovascular revascularization of the stenosed dural sigmoid sinuses was discussed in detail. Procedure would be under general anesthesia via the trans venous and transarterial diagnostic approach. One side would be treated first depending on the severity of the stenosis on diagnostic venograms. The patient would have to be started on dual antiplatelets at least 10 days prior to the procedure. The patient would stay overnight in the neuro ICU with subsequent management plan at that time. 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 It was unlikely that the patient's headaches were in any way related to this finding. Nevertheless, given the suggestion of slightly increased prominence compared to a study in 2017 a surgery consultation will be initiated following the treatment for her debilitating pulsatile tinnitus. This was discussed with the patient to  which she is agreeable. Sigmoid sinus revascularization will be scheduled as soon as possible. The patient leaves with good understanding and agreement with the above management plan. She was asked to call should she have any concerns or questions. Electronically Signed   By: Luanne Bras M.D.   On: 06/02/2021 15:38    Treatments: Overnight observation.   Discharge Exam: Blood pressure 109/69, pulse 80, temperature 98 F (36.7 C), temperature source Oral, resp. rate (!) 9, height 5' 6" (1.676 m), weight 230 lb  (104.3 kg), SpO2 97 %. Physical Exam Constitutional:      General: She is not in acute distress. HENT:     Mouth/Throat:     Mouth: Mucous membranes are moist.     Pharynx: Oropharynx is clear.  Cardiovascular:     Rate and Rhythm: Normal rate and regular rhythm.     Comments: Vascular access sites clean and dry.  Pulmonary:     Effort: Pulmonary effort is normal.  Neurological:     Mental Status: She is alert and oriented to person, place, and time.  Psychiatric:        Mood and Affect: Mood normal.        Behavior: Behavior normal.        Thought Content: Thought content normal.        Judgment: Judgment normal.    Disposition: Discharge disposition: 01-Home or Self Care   Allergies as of 06/28/2021   No Known Allergies      Medication List     TAKE these medications    acetaZOLAMIDE 250 MG tablet Commonly known as: DIAMOX TAKE 1 TABLET BY MOUTH TWICE A DAY What changed:  how much to take when to take this   Adderall XR 30 MG 24 hr capsule Generic drug: amphetamine-dextroamphetamine Take 30 mg by mouth daily.   Ajovy 225 MG/1.5ML Soaj Generic drug: Fremanezumab-vfrm Inject 225 mg into the skin every 28 (twenty-eight) days.   aspirin EC 81 MG tablet Take 81 mg by mouth daily. Swallow whole.   atorvastatin 10 MG tablet Commonly known as: LIPITOR TAKE 1 TABLET BY MOUTH EVERY DAY   Biotin 5 MG Caps Take 0.5 mg by mouth.   buPROPion 150 MG 24 hr tablet Commonly known as: WELLBUTRIN XL TAKE 1 TABLET BY MOUTH EVERY DAY IN THE MORNING   cetirizine 10 MG tablet Commonly known as: ZYRTEC Take 10 mg by mouth daily as needed for allergies.   clonazePAM 0.5 MG tablet Commonly known as: KLONOPIN TAKE 1 TABLET BY MOUTH THREE TIMES A DAY AS NEEDED FOR ANXIETY   clopidogrel 75 MG tablet Commonly known as: Plavix Take 1 tablet (75 mg total) by mouth daily.   DULoxetine 60 MG capsule Commonly known as: CYMBALTA TAKE 1 CAPSULE BY MOUTH EVERY DAY    eletriptan 40 MG tablet Commonly known as: RELPAX TAKE 1 TABLET AS NEEDED FOR MIGRAINE/ HEADACHE. MAY REPEAT IN 2 HRS IF HEADACHE PERSISTS OR RECURS.   famotidine 20 MG tablet Commonly known as: PEPCID Take 1 tablet (20 mg total) by mouth 2 (two) times daily. What changed:  when to take this reasons to take this   glucose blood test strip 1 each by Other route as needed for other. Insert 1 by subcutaneous route 2 times every day check blood sugar before breakfast and dinner   HYDROcodone-acetaminophen 10-325 MG tablet Commonly known as: NORCO Take 1 tablet by mouth every 6 (six) hours as needed for severe pain.   hydrOXYzine 50  MG capsule Commonly known as: VISTARIL TAKE 1-2 CAPSULES BY MOUTH AS NEEDED FOR SLEEP AT BEDTIME   linaclotide 72 MCG capsule Commonly known as: LINZESS Take 72 mcg by mouth daily as needed.   lisinopril-hydrochlorothiazide 10-12.5 MG tablet Commonly known as: ZESTORETIC TAKE 1 TABLET BY MOUTH EVERY DAY   MULTIPLE VITAMINS/WOMENS PO Take 1 tablet by mouth daily. Gummy   niacin 50 MG tablet Take 50 mg by mouth 3 (three) times a week.   omeprazole 40 MG capsule Commonly known as: PRILOSEC Take 40 mg by mouth daily.   ondansetron 4 MG tablet Commonly known as: ZOFRAN Take 4 mg by mouth every 8 (eight) hours as needed for nausea or vomiting.   OneTouch Verio IQ System w/Device Kit by Does not apply route. Use to check blood sugars twice   Ozempic (1 MG/DOSE) 4 MG/3ML Sopn Generic drug: Semaglutide (1 MG/DOSE) Inject 1 mg into the skin once a week. What changed: when to take this   Pen Needles 32G X 4 MM Misc 1 each by Does not apply route once a week.   phentermine 15 MG capsule Take 1 capsule (15 mg total) by mouth every morning. What changed: additional instructions   Reyvow 100 MG Tabs Generic drug: Lasmiditan Succinate Take 1 tablet by mouth daily as needed.   saccharomyces boulardii 250 MG capsule Commonly known as:  FLORASTOR Take 250 mg by mouth daily. gummy   topiramate 50 MG tablet Commonly known as: TOPAMAX TAKE 2 AND 1/2 TABLETS (125 MG TOTAL) BY MOUTH AT BEDTIME.   TURMERIC PO Take 1,000 mg by mouth daily.   Vitamin D (Ergocalciferol) 1.25 MG (50000 UNIT) Caps capsule Commonly known as: DRISDOL TAKE 1 CAPSULE (50,000 UNITS TOTAL) BY MOUTH EVERY 7 (SEVEN) DAYS What changed: when to take this   Xiidra 5 % Soln Generic drug: Lifitegrast Place 1 drop into both eyes daily.        Follow-up Information     [facility] Follow up.   Why: Follow with Dr. Estanislado Pandy in approximately 2 weeks. A scheduler from our office will call you with a date/time. Please call our office with any questions prior to your visit. Contact information: [place] [address] [phone]                  Electronically Signed: Theresa Duty, NP 06/28/2021, 11:11 AM   I have spent Less Than 30 Minutes discharging Virginia Douglas [place].

## 2021-06-30 ENCOUNTER — Telehealth: Payer: Self-pay

## 2021-06-30 ENCOUNTER — Telehealth: Payer: Self-pay | Admitting: Neurology

## 2021-06-30 ENCOUNTER — Telehealth (HOSPITAL_COMMUNITY): Payer: Self-pay

## 2021-06-30 NOTE — Telephone Encounter (Signed)
Transition Care Management Unsuccessful Follow-up Telephone Call  Date of discharge and from where:  06/28/2021 [place] [facility]  Attempts:  1st Attempt  Reason for unsuccessful TCM follow-up call:  Voice mail full

## 2021-06-30 NOTE — Telephone Encounter (Signed)
Pt advised of Dr.Jaffe note, It looks like that they just wanted her to let me know that she responded well to a migraine medication called Fioricet.  I don't like using this medication.   She should continue the Reyvow if needed.

## 2021-06-30 NOTE — Telephone Encounter (Signed)
Called to schedule 2 wk f/u. Pt is resting and will call back later to schedule. AW

## 2021-06-30 NOTE — Telephone Encounter (Signed)
Pt called, she had the stenting done, and dr.deveshwar told her to call and make a follow up apt. She has one in march, but she wants to make sure jaffe doesn't want to see her sooner

## 2021-07-07 ENCOUNTER — Other Ambulatory Visit: Payer: Self-pay | Admitting: Nurse Practitioner

## 2021-07-09 ENCOUNTER — Other Ambulatory Visit: Payer: Self-pay | Admitting: Nurse Practitioner

## 2021-07-11 ENCOUNTER — Ambulatory Visit (HOSPITAL_COMMUNITY)
Admission: RE | Admit: 2021-07-11 | Discharge: 2021-07-11 | Disposition: A | Discharge status: Home / Self Care | Payer: No Typology Code available for payment source | Source: Ambulatory Visit | Attending: Student | Admitting: Student

## 2021-07-11 ENCOUNTER — Other Ambulatory Visit (HOSPITAL_COMMUNITY): Payer: Self-pay

## 2021-07-11 ENCOUNTER — Other Ambulatory Visit (HOSPITAL_COMMUNITY)
Admission: RE | Admit: 2021-07-11 | Discharge: 2021-07-11 | Disposition: A | Discharge status: Home / Self Care | Payer: No Typology Code available for payment source | Source: Ambulatory Visit | Attending: Interventional Radiology | Admitting: Interventional Radiology

## 2021-07-11 ENCOUNTER — Other Ambulatory Visit: Payer: Self-pay

## 2021-07-11 DIAGNOSIS — G932 Benign intracranial hypertension: Secondary | ICD-10-CM | POA: Insufficient documentation

## 2021-07-13 ENCOUNTER — Other Ambulatory Visit (HOSPITAL_COMMUNITY): Payer: Self-pay | Admitting: Interventional Radiology

## 2021-07-13 DIAGNOSIS — G932 Benign intracranial hypertension: Secondary | ICD-10-CM

## 2021-07-13 HISTORY — PX: IR RADIOLOGIST EVAL & MGMT: IMG5224

## 2021-07-14 LAB — PLATELET INHIBITION P2Y12

## 2021-07-19 ENCOUNTER — Ambulatory Visit (INDEPENDENT_AMBULATORY_CARE_PROVIDER_SITE_OTHER): Payer: No Typology Code available for payment source | Admitting: Nurse Practitioner

## 2021-07-19 ENCOUNTER — Encounter: Payer: Self-pay | Admitting: Nurse Practitioner

## 2021-07-19 ENCOUNTER — Other Ambulatory Visit: Payer: Self-pay

## 2021-07-19 VITALS — BP 124/78 | HR 107 | Temp 98.1°F | Ht 66.0 in | Wt 225.8 lb

## 2021-07-19 DIAGNOSIS — I10 Essential (primary) hypertension: Secondary | ICD-10-CM

## 2021-07-19 DIAGNOSIS — M25512 Pain in left shoulder: Secondary | ICD-10-CM | POA: Diagnosis not present

## 2021-07-19 DIAGNOSIS — E1169 Type 2 diabetes mellitus with other specified complication: Secondary | ICD-10-CM

## 2021-07-19 DIAGNOSIS — E119 Type 2 diabetes mellitus without complications: Secondary | ICD-10-CM

## 2021-07-19 DIAGNOSIS — Z0001 Encounter for general adult medical examination with abnormal findings: Secondary | ICD-10-CM

## 2021-07-19 DIAGNOSIS — Z0000 Encounter for general adult medical examination without abnormal findings: Secondary | ICD-10-CM

## 2021-07-19 DIAGNOSIS — Z6836 Body mass index (BMI) 36.0-36.9, adult: Secondary | ICD-10-CM

## 2021-07-19 LAB — CMP14+EGFR
ALT: 26 IU/L (ref 0–32)
AST: 25 IU/L (ref 0–40)
Albumin/Globulin Ratio: 1.8 (ref 1.2–2.2)
Albumin: 4.8 g/dL (ref 3.8–4.8)
Alkaline Phosphatase: 169 IU/L — ABNORMAL HIGH (ref 44–121)
BUN/Creatinine Ratio: 12 (ref 9–23)
BUN: 8 mg/dL (ref 6–24)
Bilirubin Total: 0.2 mg/dL (ref 0.0–1.2)
CO2: 19 mmol/L — ABNORMAL LOW (ref 20–29)
Calcium: 10.3 mg/dL — ABNORMAL HIGH (ref 8.7–10.2)
Chloride: 102 mmol/L (ref 96–106)
Creatinine, Ser: 0.65 mg/dL (ref 0.57–1.00)
Globulin, Total: 2.7 g/dL (ref 1.5–4.5)
Glucose: 132 mg/dL — ABNORMAL HIGH (ref 70–99)
Potassium: 3.9 mmol/L (ref 3.5–5.2)
Sodium: 139 mmol/L (ref 134–144)
Total Protein: 7.5 g/dL (ref 6.0–8.5)
eGFR: 111 mL/min/{1.73_m2} (ref >59)

## 2021-07-19 LAB — LIPID PANEL
Chol/HDL Ratio: 3.2 ratio (ref 0.0–4.4)
Cholesterol, Total: 170 mg/dL (ref 100–199)
HDL: 53 mg/dL (ref >39)
LDL Chol Calc (NIH): 100 mg/dL — ABNORMAL HIGH (ref 0–99)
Triglycerides: 92 mg/dL (ref 0–149)
VLDL Cholesterol Cal: 17 mg/dL (ref 5–40)

## 2021-07-19 LAB — POCT URINALYSIS DIPSTICK
Blood, UA: NEGATIVE
Glucose, UA: NEGATIVE
Ketones, UA: NEGATIVE
Leukocytes, UA: NEGATIVE
Nitrite, UA: NEGATIVE
Protein, UA: POSITIVE — AB
Spec Grav, UA: >=1.03 — AB (ref 1.010–1.025)
Urobilinogen, UA: 1 E.U./dL
pH, UA: 5.5 (ref 5.0–8.0)

## 2021-07-19 LAB — HEMOGLOBIN A1C
Est. average glucose Bld gHb Est-mCnc: 157 mg/dL
Hgb A1c MFr Bld: 7.1 % — ABNORMAL HIGH (ref 4.8–5.6)

## 2021-07-19 LAB — CBC
Hematocrit: 40.3 % (ref 34.0–46.6)
Hemoglobin: 14 g/dL (ref 11.1–15.9)
MCH: 27.5 pg (ref 26.6–33.0)
MCHC: 34.7 g/dL (ref 31.5–35.7)
MCV: 79 fL (ref 79–97)
Platelets: 375 10*3/uL (ref 150–450)
RBC: 5.09 x10E6/uL (ref 3.77–5.28)
RDW: 14.2 % (ref 11.7–15.4)
WBC: 9.9 10*3/uL (ref 3.4–10.8)

## 2021-07-19 LAB — POCT UA - MICROALBUMIN
Creatinine, POC: 300 mg/dL
Microalbumin Ur, POC: 80 mg/L

## 2021-07-19 MED ORDER — MOUNJARO 2.5 MG/0.5ML ~~LOC~~ SOAJ: 2.5000 mg | SUBCUTANEOUS | 1 refills | Status: DC

## 2021-07-19 NOTE — Patient Instructions (Addendum)

## 2021-07-19 NOTE — Progress Notes (Signed)
I,Victoria T Hamilton,acting as a Education administrator for [name], FNP.,have documented all relevant documentation on the behalf of [name], FNP,as directed by  [name], FNP while in the presence of [name], [place].  This visit occurred during the SARS-CoV-2 public health emergency.  Safety protocols were in place, including screening questions prior to the visit, additional usage of staff PPE, and extensive cleaning of exam room while observing appropriate contact time as indicated for disinfecting solutions.  Subjective:     Patient ID: Virginia Douglas , female    DOB: August 27, 1976 , 45 y.o.   MRN: [id number]   Chief Complaint  Patient presents with   Annual Exam     HPI  Pt here for HM.  She had Cerebral Stenting with Dr Estanislado Pandy. She had done on the left side. She is to have the right side done at the beginning.  She is being referred to [facility] for an AVM at the base of her brain. She just went back to work this week, she was out since September 23rd. Her next follow up is in 4 weeks with Dr. Bronson Curb. She has not seen any other providers. She is now on Plavix.   She was also switched from her Amovig to [place].   Diabetes She presents for her follow-up diabetic visit. She has type 2 diabetes mellitus. Her disease course has been stable. There are no hypoglycemic associated symptoms. Pertinent negatives for hypoglycemia include no confusion, dizziness or nervousness/anxiousness. There are no diabetic associated symptoms. Pertinent negatives for diabetes include no chest pain, no fatigue, no polydipsia, no polyphagia and no polyuria. There are no hypoglycemic complications. Symptoms are stable. There are no diabetic complications. Risk factors for coronary artery disease include hypertension, diabetes mellitus, sedentary lifestyle and obesity. She is compliant with treatment all of the time. She is following a generally healthy diet. When asked about meal planning, she reported none.  She has not had a previous visit with a dietitian. She rarely participates in exercise. There is no change in her home blood glucose trend. (While in the hospital blood sugar has been as high as 140. ) An ACE inhibitor/angiotensin II receptor blocker is being taken. Eye exam current: she is seeing an opthalmologist.    Past Medical History:  Diagnosis Date   ADHD (attention deficit hyperactivity disorder)    Anemia    iron deficiency   as a teenager   Anxiety    Arthritis    Diabetes ([place])    Edema    GERD (gastroesophageal reflux disease)    Hypertension    patient denies- patient stated that the PCP said it was for heart prevention.   Migraine headache      Family History  Problem Relation Age of Onset   Diabetes Mother    Hypertension Mother    Diabetes Father    Hypertension Father    Kidney failure Father    Hypertension Brother    Diabetes Maternal Grandmother    Pancreatic cancer Maternal Grandmother    Diabetes Paternal Grandmother    Breast cancer Paternal Grandmother    Stroke Paternal Grandfather    Cancer Maternal Aunt        breast   Breast cancer Maternal Aunt    Healthy Son    Healthy Son      Current Outpatient Medications:    acetaZOLAMIDE (DIAMOX) 250 MG tablet, TAKE 1 TABLET BY MOUTH TWICE A DAY (Patient taking differently: Take 500 mg by mouth daily.), Disp:  60 tablet, Rfl: 2   ADDERALL XR 30 MG 24 hr capsule, Take 30 mg by mouth daily., Disp: , Rfl:    aspirin EC 81 MG tablet, Take 81 mg by mouth daily. Swallow whole., Disp: , Rfl:    atorvastatin (LIPITOR) 10 MG tablet, TAKE 1 TABLET BY MOUTH EVERY DAY, Disp: 90 tablet, Rfl: 1   Biotin 5 MG CAPS, Take 0.5 mg by mouth., Disp: , Rfl:    Blood Glucose Monitoring Suppl (ONETOUCH VERIO IQ SYSTEM) w/Device KIT, by Does not apply route. Use to check blood sugars twice, Disp: , Rfl:    buPROPion (WELLBUTRIN XL) 150 MG 24 hr tablet, TAKE 1 TABLET BY MOUTH EVERY DAY IN THE MORNING, Disp: 90 tablet, Rfl: 1    cetirizine (ZYRTEC) 10 MG tablet, Take 10 mg by mouth daily as needed for allergies., Disp: , Rfl:    clonazePAM (KLONOPIN) 0.5 MG tablet, TAKE 1 TABLET BY MOUTH THREE TIMES A DAY AS NEEDED FOR ANXIETY, Disp: 30 tablet, Rfl: 1   clopidogrel (PLAVIX) 75 MG tablet, Take 1 tablet (75 mg total) by mouth daily., Disp: 30 tablet, Rfl: 3   DULoxetine (CYMBALTA) 60 MG capsule, TAKE 1 CAPSULE BY MOUTH EVERY DAY, Disp: 90 capsule, Rfl: 0   Fremanezumab-vfrm (AJOVY) 225 MG/1.5ML SOAJ, Inject 225 mg into the skin every 28 (twenty-eight) days., Disp: 1.68 mL, Rfl: 5   glucose blood test strip, 1 each by Other route as needed for other. Insert 1 by subcutaneous route 2 times every day check blood sugar before breakfast and dinner, Disp: , Rfl:    HYDROcodone-acetaminophen (NORCO) 10-325 MG tablet, Take 1 tablet by mouth every 6 (six) hours as needed for severe pain., Disp: , Rfl:    hydrOXYzine (VISTARIL) 50 MG capsule, TAKE 1-2 CAPSULES BY MOUTH AS NEEDED FOR SLEEP AT BEDTIME, Disp: 180 capsule, Rfl: 1   Insulin Pen Needle (PEN NEEDLES) 32G X 4 MM MISC, 1 each by Does not apply route once a week., Disp: 30 each, Rfl: 3   Lasmiditan Succinate (REYVOW) 100 MG TABS, Take 1 tablet by mouth daily as needed., Disp: 16 tablet, Rfl: 5   Lifitegrast (XIIDRA) 5 % SOLN, Place 1 drop into both eyes daily., Disp: , Rfl:    linaclotide (LINZESS) 72 MCG capsule, Take 72 mcg by mouth daily as needed., Disp: , Rfl:    lisinopril-hydrochlorothiazide (ZESTORETIC) 10-12.5 MG tablet, TAKE 1 TABLET BY MOUTH EVERY DAY, Disp: 90 tablet, Rfl: 1   Multiple Vitamins-Minerals (MULTIPLE VITAMINS/WOMENS PO), Take 1 tablet by mouth daily. Gummy, Disp: , Rfl:    niacin 50 MG tablet, Take 50 mg by mouth 3 (three) times a week., Disp: , Rfl:    omeprazole (PRILOSEC) 40 MG capsule, Take 40 mg by mouth daily., Disp: , Rfl:    ondansetron (ZOFRAN) 4 MG tablet, Take 4 mg by mouth every 8 (eight) hours as needed for nausea or vomiting., Disp: , Rfl:     saccharomyces boulardii (FLORASTOR) 250 MG capsule, Take 250 mg by mouth daily. gummy, Disp: , Rfl:    Semaglutide, 1 MG/DOSE, (OZEMPIC, 1 MG/DOSE,) 4 MG/3ML SOPN, Inject 1 mg into the skin once a week. (Patient taking differently: Inject 1 mg into the skin every Sunday.), Disp: 91 mL, Rfl: 1   tirzepatide (MOUNJARO) 2.5 MG/0.5ML Pen, Inject 2.5 mg into the skin once a week., Disp: 2 mL, Rfl: 1   topiramate (TOPAMAX) 50 MG tablet, TAKE 2 AND 1/2 TABLETS (125 MG TOTAL) BY MOUTH AT  BEDTIME., Disp: 75 tablet, Rfl: 0   TURMERIC PO, Take 1,000 mg by mouth daily., Disp: , Rfl:    Vitamin D, Ergocalciferol, (DRISDOL) 1.25 MG (50000 UNIT) CAPS capsule, TAKE 1 CAPSULE (50,000 UNITS TOTAL) BY MOUTH EVERY 7 (SEVEN) DAYS, Disp: 12 capsule, Rfl: 0   eletriptan (RELPAX) 40 MG tablet, TAKE 1 TABLET AS NEEDED FOR MIGRAINE/ HEADACHE. MAY REPEAT IN 2 HRS IF HEADACHE PERSISTS OR RECURS. (Patient not taking: No sig reported), Disp: 10 tablet, Rfl: 1   famotidine (PEPCID) 20 MG tablet, Take 1 tablet (20 mg total) by mouth 2 (two) times daily. (Patient not taking: Reported on 07/19/2021), Disp: 10 tablet, Rfl: 0   phentermine 15 MG capsule, Take 1 capsule (15 mg total) by mouth every morning. (Patient taking differently: Take 15 mg by mouth every morning. Has not taken in a few months.), Disp: 30 capsule, Rfl: 1   No Known Allergies    The patient states she is status post hysterectomy for birth control.  No LMP recorded. Patient has had a hysterectomy.. Negative for: breast discharge, breast lump(s), breast pain and breast self exam. Associated symptoms include abnormal vaginal bleeding. Pertinent negatives include abnormal bleeding (hematology), anxiety, decreased libido, depression, difficulty falling sleep, dyspareunia, history of infertility, nocturia, sexual dysfunction, sleep disturbances, urinary incontinence, urinary urgency, vaginal discharge and vaginal itching. Diet regular; she reports she does not eat a  lot, feels it is the time she eats.  The patient states her exercise level is minimal - due to her recent stenting - she was just released to drive.   The patient's tobacco use is:  Social History   Tobacco Use  Smoking Status Former   Packs/day: 0.50   Years: 25.00   Pack years: 12.50   Types: Cigarettes   Start date: 03/24/2019  Smokeless Tobacco Never  Tobacco Comments   06/24/21- quit over a year ago  . She has been exposed to passive smoke. The patient's alcohol use is:  Social History   Substance and Sexual Activity  Alcohol Use No   Alcohol/week: 0.0 standard drinks      Review of Systems  Constitutional: Negative.  Negative for fatigue.  HENT: Negative.    Eyes: Negative.   Respiratory: Negative.    Cardiovascular: Negative.  Negative for chest pain, palpitations and leg swelling.  Gastrointestinal: Negative.   Endocrine: Negative.  Negative for polydipsia, polyphagia and polyuria.  Genitourinary: Negative.   Musculoskeletal:  Positive for arthralgias (left shoulder pain).  Skin: Negative.   Allergic/Immunologic: Negative.   Neurological: Negative.  Negative for dizziness.  Hematological: Negative.   Psychiatric/Behavioral: Negative.  Negative for confusion. The patient is not nervous/anxious.     Today's Vitals   07/19/21 0832  BP: 124/78  Pulse: (!) 107  Temp: 98.1 F (36.7 C)  Weight: 225 lb 12.8 oz (102.4 kg)  Height: 5' 6"  (1.676 m)  PainSc: 6    Body mass index is 36.45 kg/m.  Wt Readings from Last 3 Encounters:  07/19/21 225 lb 12.8 oz (102.4 kg)  06/27/21 230 lb (104.3 kg)  05/31/21 228 lb (103.4 kg)    Objective:  Physical Exam Constitutional:      General: She is not in acute distress.    Appearance: Normal appearance. She is well-developed. She is obese.  HENT:     Head: Normocephalic and atraumatic.     Right Ear: Hearing, tympanic membrane, ear canal and external ear normal. There is no impacted cerumen.     Left  Ear: Hearing,  tympanic membrane, ear canal and external ear normal. There is no impacted cerumen.     Nose:     Comments: Deferred - masked    Mouth/Throat:     Comments: Deferred - masked Eyes:     General: Lids are normal.     Extraocular Movements: Extraocular movements intact.     Conjunctiva/sclera: Conjunctivae normal.     Pupils: Pupils are equal, round, and reactive to light.     Funduscopic exam:    Right eye: No papilledema.        Left eye: No papilledema.  Neck:     Thyroid: No thyroid mass.     Vascular: No carotid bruit.  Cardiovascular:     Rate and Rhythm: Normal rate and regular rhythm.     Pulses: Normal pulses.     Heart sounds: Normal heart sounds. No murmur heard. Pulmonary:     Effort: Pulmonary effort is normal.     Breath sounds: Normal breath sounds.  Chest:     Chest wall: No mass.  Breasts:    Tanner Score is 5.     Right: Normal. No mass or tenderness.     Left: Normal. No mass or tenderness.  Abdominal:     General: Abdomen is flat. Bowel sounds are normal. There is no distension.     Palpations: Abdomen is soft.     Tenderness: There is no abdominal tenderness.  Genitourinary:    Rectum: Guaiac result negative.  Musculoskeletal:        General: No swelling.     Right shoulder: No swelling or tenderness. Normal range of motion.     Left shoulder: Tenderness present. No swelling or deformity. Decreased range of motion. Normal strength. Normal pulse.     Cervical back: Full passive range of motion without pain, normal range of motion and neck supple.     Right lower leg: No edema.     Left lower leg: No edema.  Lymphadenopathy:     Upper Body:     Right upper body: No supraclavicular, axillary or pectoral adenopathy.     Left upper body: No supraclavicular, axillary or pectoral adenopathy.  Skin:    General: Skin is warm and dry.     Capillary Refill: Capillary refill takes less than 2 seconds.  Neurological:     General: No focal deficit present.      Mental Status: She is alert and oriented to person, place, and time.     Cranial Nerves: No cranial nerve deficit.     Sensory: No sensory deficit.  Psychiatric:        Mood and Affect: Mood normal.        Behavior: Behavior normal.        Thought Content: Thought content normal.        Judgment: Judgment normal.        Assessment And Plan:     1. Annual physical exam Behavior modifications discussed and diet history reviewed.   Pt will continue to exercise regularly and modify diet with low GI, plant based foods and decrease intake of processed foods.  Recommend intake of daily multivitamin, Vitamin D, and calcium.  Recommend mammogram for preventive screenings, as well as recommend immunizations that include influenza, TDAP (up to date) - CMP14+EGFR - CBC - Hemoglobin A1c - POCT Urinalysis Dipstick (81002) - POCT UA - Microalbumin - Lipid panel  2. Essential hypertension Blood pressure is well controlled, continue current medications  3. Type 2 diabetes mellitus without complication, without long-term current use of insulin ([place]) Comments: Insurance will not cover Ozempic after this month, given sample of Mounjaro, will try to see if insurance covers.  I will refer her to another ophthalmologist was going to Dr. Venetia Maxon. Will change Ozempic to [facility] due to no lover covered by insurance per patient in the new year, she is to continue what she has left with Ozempic then start the sample dose of Mounjaro - Ambulatory referral to Ophthalmology - tirzepatide [facility]) 2.5 MG/0.5ML Pen; Inject 2.5 mg into the skin once a week.  Dispense: 2 mL; Refill: 1  4. Class 2 severe obesity due to excess calories with serious comorbidity and body mass index (BMI) of 36.0 to 36.9 in adult [facility]) Chronic Discussed healthy diet and regular exercise options  Encouraged to exercise at least 150 minutes per week with 2 days of strength training She is encouraged to strive for BMI less than 30 to  decrease cardiac risk. Advised to aim for at least 150 minutes of exercise per week.   5. Acute pain of left shoulder Pain with movement, negative neers and hawkins.  Bursitis vs arthritis When looking back in reconciling history she had left shoulder pain in 2020, this may be a reoccurrence - DG Shoulder Left; Future     Patient was given opportunity to ask questions. Patient verbalized understanding of the plan and was able to repeat key elements of the plan. All questions were answered to their satisfaction.   [name], FNP   I, [name], FNP, have reviewed all documentation for this visit. The documentation on 07/19/21 for the exam, diagnosis, procedures, and orders are all accurate and complete.   THE PATIENT IS ENCOURAGED TO PRACTICE SOCIAL DISTANCING DUE TO THE COVID-19 PANDEMIC.

## 2021-08-02 ENCOUNTER — Ambulatory Visit
Admission: RE | Admit: 2021-08-02 | Discharge: 2021-08-02 | Disposition: A | Discharge status: Home / Self Care | Payer: No Typology Code available for payment source | Source: Ambulatory Visit | Attending: Nurse Practitioner | Admitting: Nurse Practitioner

## 2021-08-02 DIAGNOSIS — M25512 Pain in left shoulder: Secondary | ICD-10-CM

## 2021-08-02 IMAGING — CR DG SHOULDER 2+V*L*
3 series · 3 of 3 positions shown · non-contrast
Comparison: None.

CLINICAL DATA: Atraumatic left AC joint pain and decreased range of
motion x3 weeks.

EXAM:
LEFT SHOULDER - 2+ VIEW

[w shoulder ap internal left]
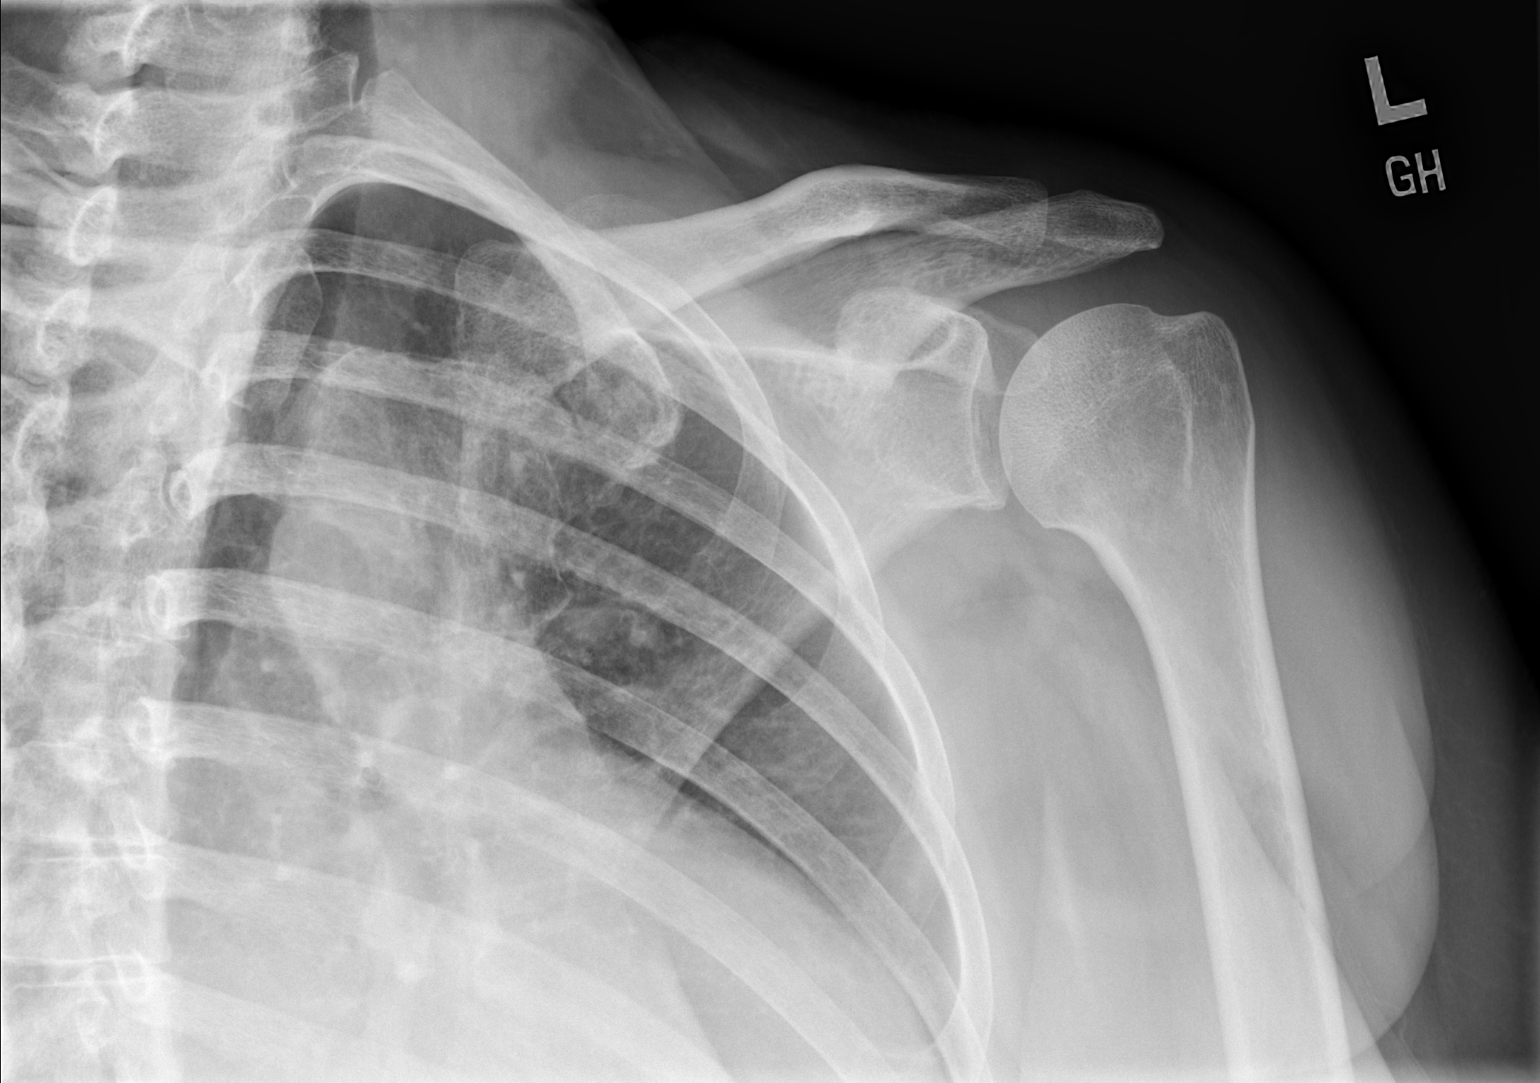

[w shoulder y view left]
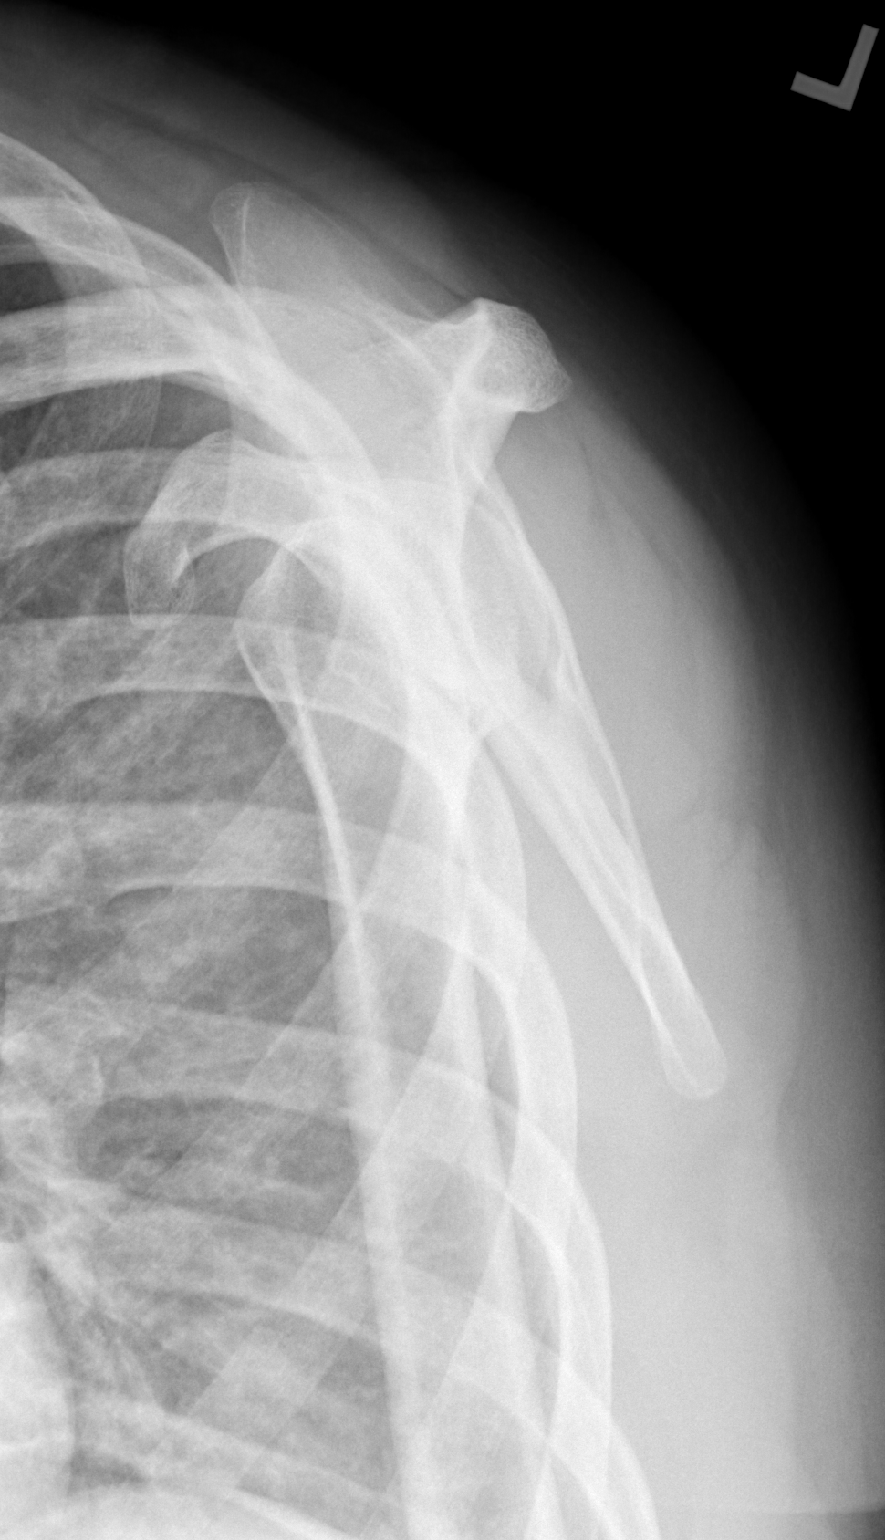

[w shoulder axillary left *]
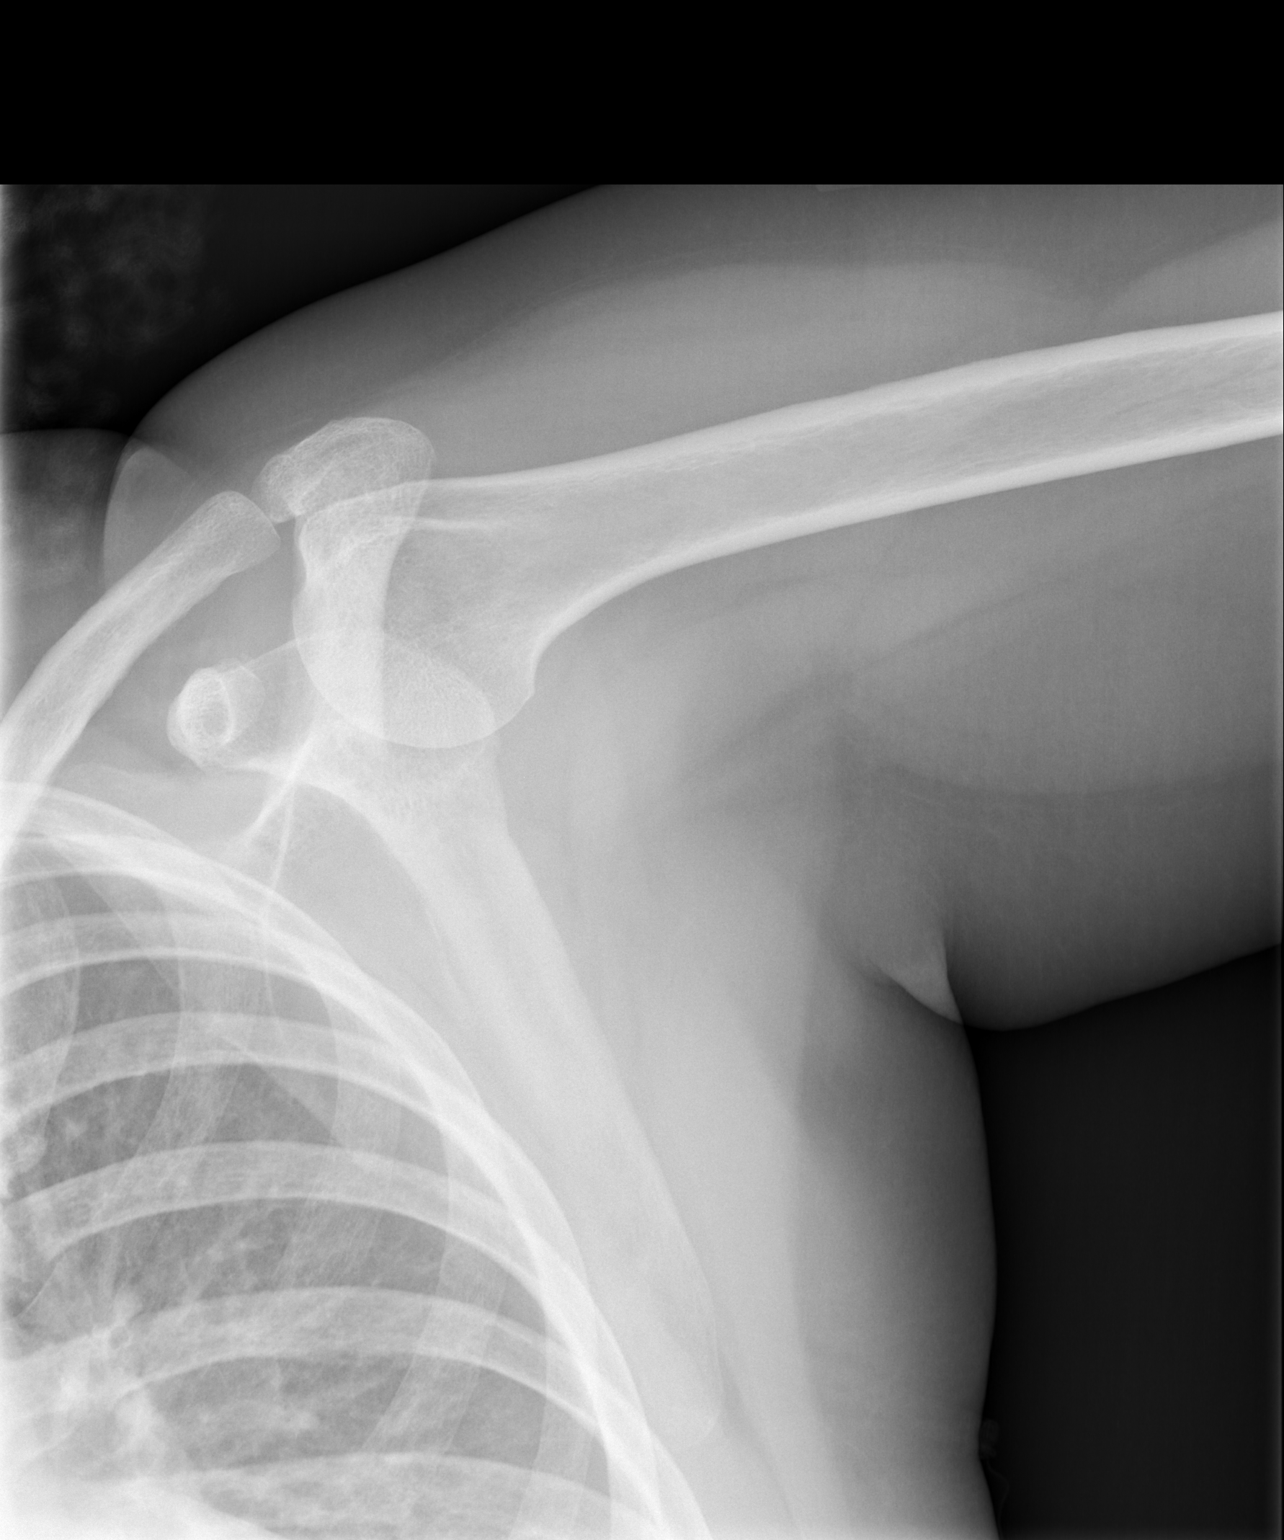

[3 of 3 positions shown; findings below may reference images not displayed]

FINDINGS: There is no evidence of fracture or dislocation. There is no
evidence of arthropathy or other focal bone abnormality. Soft
tissues are unremarkable.
IMPRESSION: Negative.

## 2021-08-03 ENCOUNTER — Encounter: Payer: Self-pay | Admitting: Nurse Practitioner

## 2021-08-04 ENCOUNTER — Ambulatory Visit (HOSPITAL_COMMUNITY): Payer: Self-pay

## 2021-08-08 ENCOUNTER — Ambulatory Visit
Admission: RE | Admit: 2021-08-08 | Discharge: 2021-08-08 | Disposition: A | Discharge status: Home / Self Care | Payer: No Typology Code available for payment source | Source: Ambulatory Visit | Attending: Emergency Medicine | Admitting: Emergency Medicine

## 2021-08-08 ENCOUNTER — Other Ambulatory Visit: Payer: Self-pay

## 2021-08-08 ENCOUNTER — Other Ambulatory Visit: Payer: Self-pay | Admitting: Neurology

## 2021-08-08 ENCOUNTER — Telehealth (HOSPITAL_COMMUNITY): Payer: Self-pay

## 2021-08-08 VITALS — BP 134/72 | HR 91 | Temp 98.3°F | Resp 18

## 2021-08-08 DIAGNOSIS — J00 Acute nasopharyngitis [common cold]: Secondary | ICD-10-CM | POA: Diagnosis not present

## 2021-08-08 DIAGNOSIS — R0982 Postnasal drip: Secondary | ICD-10-CM

## 2021-08-08 DIAGNOSIS — J069 Acute upper respiratory infection, unspecified: Secondary | ICD-10-CM

## 2021-08-08 DIAGNOSIS — J111 Influenza due to unidentified influenza virus with other respiratory manifestations: Secondary | ICD-10-CM

## 2021-08-08 DIAGNOSIS — R051 Acute cough: Secondary | ICD-10-CM | POA: Diagnosis not present

## 2021-08-08 DIAGNOSIS — R0689 Other abnormalities of breathing: Secondary | ICD-10-CM

## 2021-08-08 MED ORDER — PROMETHAZINE-DM 6.25-15 MG/5ML PO SYRP: 5.0000 mL | ORAL_SOLUTION | Freq: Four times a day (QID) | ORAL | 0 refills | Status: DC | PRN

## 2021-08-08 MED ORDER — METHYLPREDNISOLONE 4 MG PO TBPK: ORAL_TABLET | ORAL | 0 refills | Status: DC

## 2021-08-08 MED ORDER — IPRATROPIUM BROMIDE 0.06 % NA SOLN: 2.0000 | Freq: Four times a day (QID) | NASAL | 12 refills | Status: DC

## 2021-08-08 NOTE — ED Provider Notes (Signed)
[facility]    CSN: [id number] Arrival date & time: 08/08/21  1155    HISTORY  No chief complaint on file.  HPI Virginia Douglas is a 45 y.o. female. Patient complains of a 3-day history of body ache, headache, fever and chills.  The cough is productive, worse at night.  Patient states her boyfriend has been sick as well, he was sick first, states she feels like she got it from him.  Patient states he has not been tested for flu or COVID as of yet.  Patient has normal vital signs on arrival today.  Denies known sick exposures.  Patient states she has been taking over-the-counter cold and flu preparations with some relief but then symptoms returned.  Patient states she currently takes blood thinners, does not tolerate nonsteroidal anti-inflammatory medications due to this.  The history is provided by the patient.  Past Medical History:  Diagnosis Date   ADHD (attention deficit hyperactivity disorder)    Anemia    iron deficiency   as a teenager   Anxiety    Arthritis    Diabetes ([place])    Edema    GERD (gastroesophageal reflux disease)    Hypertension    patient denies- patient stated that the PCP said it was for heart prevention.   Migraine headache    Patient Active Problem List   Diagnosis Date Noted   Pulsatile tinnitus, bilateral 06/27/2021   DDD (degenerative disc disease), cervical 12/06/2020   DDD (degenerative disc disease), lumbar 12/06/2020   Gastroesophageal reflux disease with esophagitis without hemorrhage 12/06/2020   Increased intracranial pressure 12/06/2020   PTSD (post-traumatic stress disorder) 12/06/2020   Attention deficit hyperactivity disorder (ADHD), predominantly inattentive type 12/06/2020   Vitamin D deficiency 12/06/2020   Obesity (BMI 30-39.9) 03/15/2020   Sciatic nerve pain 12/10/2019   Acute pain of left shoulder 06/05/2019   Grief 06/05/2019   Migraine 06/05/2019   Type 2 diabetes mellitus without complication, without  long-term current use of insulin ([place]) 04/07/2019   Depression 04/07/2019   Obesity due to excess calories without serious comorbidity 04/07/2019   Left leg pain 04/07/2019   Anxiety 04/07/2019   Headache 07/06/2018   Diabetes ([place]) 12/08/2015   Essential hypertension 12/08/2015   Smoker 12/08/2015   LLQ pain 12/08/2015   Paresthesia 09/16/2015   Urinary urgency 09/16/2015   Past Surgical History:  Procedure Laterality Date   ABDOMINAL HYSTERECTOMY     CHOLECYSTECTOMY     COLONOSCOPY  12/28/2020   polyps   ESOPHAGEAL DILATION     IR ANGIO INTRA EXTRACRAN SEL COM CAROTID INNOMINATE BILAT MOD SED  05/31/2021   IR ANGIO INTRA EXTRACRAN SEL COM CAROTID INNOMINATE BILAT MOD SED  06/27/2021   IR ANGIO VERTEBRAL SEL VERTEBRAL UNI R MOD SED  05/31/2021   IR CT HEAD LTD  06/27/2021   IR INTRA CRAN STENT  06/27/2021   IR RADIOLOGIST EVAL & MGMT  06/03/2021   IR RADIOLOGIST EVAL & MGMT  07/13/2021   IR US GUIDE VASC ACCESS RIGHT  05/31/2021   IR US GUIDE VASC ACCESS RIGHT  06/27/2021   OVARIAN CYST REMOVAL     RADIOLOGY WITH ANESTHESIA N/A 06/27/2021   Procedure: STENTING;  Surgeon: Luanne Bras, MD;  Location: [facility];  Service: Radiology;  Laterality: N/A;   OB History   No obstetric history on file.    Home Medications    Prior to Admission medications   Medication Sig Start Date End Date Taking? Authorizing  Provider  ipratropium (ATROVENT) 0.06 % nasal spray Place 2 sprays into both nostrils 4 (four) times daily. As needed for nasal congestion, runny nose 08/08/21  Yes Lynden Oxford Scales, PA-C  methylPREDNISolone (MEDROL DOSEPAK) 4 MG TBPK tablet Take 24 mg on day 1, 20 mg on day 2, 16 mg on day 3, 12 mg on day 4, 8 mg on day 5, 4 mg on day 6. 08/08/21  Yes Lynden Oxford Scales, PA-C  acetaZOLAMIDE (DIAMOX) 250 MG tablet TAKE 1 TABLET BY MOUTH TWICE A DAY Patient taking differently: Take 500 mg by mouth daily. 03/23/21   Jaffe, Adam R, DO  ADDERALL XR 30 MG 24 hr capsule  Take 30 mg by mouth daily. 09/29/20   [provider]  AJOVY 225 MG/1.5ML SOAJ INJECT 225 MG INTO THE SKIN EVERY 28 (TWENTY-EIGHT) DAYS. 08/08/21   Pieter Partridge, DO  aspirin EC 81 MG tablet Take 81 mg by mouth daily. Swallow whole.    [provider]  atorvastatin (LIPITOR) 10 MG tablet TAKE 1 TABLET BY MOUTH EVERY DAY 04/26/21   [name], FNP  Biotin 5 MG CAPS Take 0.5 mg by mouth.    [provider]  Blood Glucose Monitoring Suppl (ONETOUCH VERIO IQ SYSTEM) w/Device KIT by Does not apply route. Use to check blood sugars twice    [provider]  buPROPion (WELLBUTRIN XL) 150 MG 24 hr tablet TAKE 1 TABLET BY MOUTH EVERY DAY IN THE MORNING 05/26/21   [name], FNP  cetirizine (ZYRTEC) 10 MG tablet Take 10 mg by mouth daily as needed for allergies.    [provider]  clonazePAM (KLONOPIN) 0.5 MG tablet TAKE 1 TABLET BY MOUTH THREE TIMES A DAY AS NEEDED FOR ANXIETY 06/10/21   [name], FNP  clopidogrel (PLAVIX) 75 MG tablet Take 1 tablet (75 mg total) by mouth daily. 06/13/21 10/11/21  Theresa Duty, NP  DULoxetine (CYMBALTA) 60 MG capsule TAKE 1 CAPSULE BY MOUTH EVERY DAY 06/02/21   Tomi Likens, Adam R, DO  glucose blood test strip 1 each by Other route as needed for other. Insert 1 by subcutaneous route 2 times every day check blood sugar before breakfast and dinner    [provider]  HYDROcodone-acetaminophen (NORCO) 10-325 MG tablet Take 1 tablet by mouth every 6 (six) hours as needed for severe pain. 09/17/20   [provider]  hydrOXYzine (VISTARIL) 50 MG capsule TAKE 1-2 CAPSULES BY MOUTH AS NEEDED FOR SLEEP AT BEDTIME 06/08/21   [name], FNP  Insulin Pen Needle (PEN NEEDLES) 32G X 4 MM MISC 1 each by Does not apply route once a week. 11/11/18   [name], FNP  Lasmiditan Succinate (REYVOW) 100 MG TABS Take 1 tablet by mouth daily as needed. 12/31/19   Tomi Likens, Adam R, DO  Lifitegrast (XIIDRA) 5 % SOLN Place 1 drop into  both eyes daily.    [provider]  linaclotide (LINZESS) 72 MCG capsule Take 72 mcg by mouth daily as needed.    [provider]  lisinopril-hydrochlorothiazide (ZESTORETIC) 10-12.5 MG tablet TAKE 1 TABLET BY MOUTH EVERY DAY 04/25/21   [place] Chard, MD  Multiple Vitamins-Minerals (MULTIPLE VITAMINS/WOMENS PO) Take 1 tablet by mouth daily. Gummy    [provider]  niacin 50 MG tablet Take 50 mg by mouth 3 (three) times a week.    [provider]  omeprazole (PRILOSEC) 40 MG capsule Take 40 mg by mouth daily.    [provider]  ondansetron (ZOFRAN) 4 MG tablet Take 4 mg by mouth every 8 (eight) hours as needed for nausea or vomiting.    [provider]  phentermine 15 MG capsule Take 1 capsule (15 mg total) by mouth every morning. Patient taking differently: Take 15 mg by mouth every morning. Has not taken in a few months. 04/21/21   [name], FNP  saccharomyces boulardii (FLORASTOR) 250 MG capsule Take 250 mg by mouth daily. gummy    [provider]  Semaglutide, 1 MG/DOSE, (OZEMPIC, 1 MG/DOSE,) 4 MG/3ML SOPN Inject 1 mg into the skin once a week. Patient taking differently: Inject 1 mg into the skin every Sunday. 05/05/21   [name], FNP  tirzepatide [facility]) 2.5 MG/0.5ML Pen Inject 2.5 mg into the skin once a week. 07/19/21   [name], FNP  topiramate (TOPAMAX) 50 MG tablet TAKE 2 AND 1/2 TABLETS (125 MG TOTAL) BY MOUTH AT BEDTIME. 04/07/21   Tomi Likens, Adam R, DO  TURMERIC PO Take 1,000 mg by mouth daily.    [provider]  Vitamin D, Ergocalciferol, (DRISDOL) 1.25 MG (50000 UNIT) CAPS capsule TAKE 1 CAPSULE (50,000 UNITS TOTAL) BY MOUTH EVERY 7 (SEVEN) DAYS 07/08/21   [name], FNP  metFORMIN (GLUCOPHAGE-XR) 500 MG 24 hr tablet Take 2 tablets (1,000 mg total) by mouth 2 (two) times daily. Patient not taking: Reported on 06/05/2019 03/10/16 08/15/19  [name], MD  Phentermine-Topiramate 11.25-69 MG CP24  Take 1 tablet by mouth daily. 02/04/19 04/07/19  [name], FNP   Family History Family History  Problem Relation Age of Onset   Diabetes Mother    Hypertension Mother    Diabetes Father    Hypertension Father    Kidney failure Father    Hypertension Brother    Diabetes Maternal Grandmother    Pancreatic cancer Maternal Grandmother    Diabetes Paternal Grandmother    Breast cancer Paternal Grandmother    Stroke Paternal Grandfather    Cancer Maternal Aunt        breast   Breast cancer Maternal Aunt    Healthy Son    Healthy Son    Social History Social History   Tobacco Use   Smoking status: Former    Packs/day: 0.50    Years: 25.00    Pack years: 12.50    Types: Cigarettes    Start date: 03/24/2019   Smokeless tobacco: Never   Tobacco comments:    06/24/21- quit over a year ago  Vaping Use   Vaping Use: Never used  Substance Use Topics   Alcohol use: No    Alcohol/week: 0.0 standard drinks   Drug use: No   Allergies   Patient has no known allergies.  Review of Systems Review of Systems Pertinent findings noted in history of present illness.   Physical Exam Triage Vital Signs ED Triage Vitals  Enc Vitals Group     BP 07/01/21 0827 (!) 147/82     Pulse Rate 07/01/21 0827 72     Resp 07/01/21 0827 18     Temp 07/01/21 0827 98.3 F (36.8 C)     Temp Source 07/01/21 0827 Oral     SpO2 07/01/21 0827 98 %     Weight --      Height --      Head Circumference --      Peak Flow --      Pain Score 07/01/21 0826 5     Pain Loc --  Pain Edu? --      Excl. in [place]? --   No data found.  Updated Vital Signs BP 134/72 (BP Location: Left Arm)   Pulse 91   Temp 98.3 F (36.8 C) (Oral)   Resp 18   SpO2 98%   Physical Exam Constitutional:      Appearance: She is ill-appearing.  HENT:     Head: Normocephalic and atraumatic.     Salivary Glands: Right salivary gland is not diffusely enlarged or tender. Left salivary gland is not diffusely enlarged or  tender.     Right Ear: Tympanic membrane, ear canal and external ear normal.     Left Ear: Tympanic membrane, ear canal and external ear normal.     Nose: Congestion and rhinorrhea present. Rhinorrhea is clear.     Right Sinus: No maxillary sinus tenderness or frontal sinus tenderness.     Left Sinus: No maxillary sinus tenderness.     Mouth/Throat:     Mouth: Mucous membranes are moist.     Pharynx: Pharyngeal swelling, posterior oropharyngeal erythema and uvula swelling present.     Tonsils: No tonsillar exudate. 0 on the right. 0 on the left.  Cardiovascular:     Rate and Rhythm: Normal rate and regular rhythm.     Pulses: Normal pulses.  Pulmonary:     Effort: Pulmonary effort is normal. No accessory muscle usage, prolonged expiration or respiratory distress.     Breath sounds: No stridor. No wheezing, rhonchi or rales.     Comments: Turbulent breath sounds throughout without wheeze, rale, rhonchi. Abdominal:     General: Abdomen is flat. Bowel sounds are normal.     Palpations: Abdomen is soft.  Musculoskeletal:        General: Normal range of motion.  Lymphadenopathy:     Cervical: Cervical adenopathy present.     Right cervical: Superficial cervical adenopathy and posterior cervical adenopathy present.     Left cervical: Superficial cervical adenopathy and posterior cervical adenopathy present.  Skin:    General: Skin is warm and dry.  Neurological:     General: No focal deficit present.     Mental Status: She is alert and oriented to person, place, and time.     Motor: Motor function is intact.     Coordination: Coordination is intact.     Gait: Gait is intact.     Deep Tendon Reflexes: Reflexes are normal and symmetric.  Psychiatric:        Attention and Perception: Attention and perception normal.        Mood and Affect: Mood and affect normal.        Speech: Speech normal.        Behavior: Behavior normal. Behavior is cooperative.        Thought Content: Thought  content normal.    Visual Acuity Right Eye Distance:   Left Eye Distance:   Bilateral Distance:    Right Eye Near:   Left Eye Near:    Bilateral Near:     UC Couse / Diagnostics / Procedures:    EKG  Radiology No results found.  Procedures Procedures (including critical care time)  UC Diagnoses / Final Clinical Impressions(s)   I have reviewed the triage vital signs and the nursing notes.  Pertinent labs & imaging results that were available during my care of the patient were reviewed by me and considered in my medical decision making (see chart for details).   Final diagnoses:  Acute  cough  Influenza-like illness  Acute rhinitis  Postnasal drip  Decreased breath sounds  Viral upper respiratory tract infection   Viral upper respiratory infection possibly flu.  Viral testing not indicated at this time due to duration of symptoms.  Based on physical exam findings, I do not see any indication for antibiotics.  Conservative care recommended.  Patient provided with Medrol Dosepak for decreased breath sounds and inflammation of upper airways, patient states he does not tolerate ibuprofen, Atrovent nasal spray also provided to use as needed.  As advised.  ED Prescriptions     Medication Sig Dispense Auth. Provider   ipratropium (ATROVENT) 0.06 % nasal spray Place 2 sprays into both nostrils 4 (four) times daily. As needed for nasal congestion, runny nose 15 mL Lynden Oxford Scales, PA-C   methylPREDNISolone (MEDROL DOSEPAK) 4 MG TBPK tablet Take 24 mg on day 1, 20 mg on day 2, 16 mg on day 3, 12 mg on day 4, 8 mg on day 5, 4 mg on day 6. 21 tablet Lynden Oxford Scales, PA-C      PDMP not reviewed this encounter.  Pending results:  Labs Reviewed - No data to display  Medications Ordered in UC: Medications - No data to display  Disposition Upon Discharge:  Condition: stable for discharge home Home: take medications as prescribed; routine discharge instructions as  discussed; follow up as advised.  Patient presented with an acute illness with associated systemic symptoms and significant discomfort requiring urgent management. In my opinion, this is a condition that a prudent lay person (someone who possesses an average knowledge of health and medicine) may potentially expect to result in complications if not addressed urgently such as respiratory distress, impairment of bodily function or dysfunction of bodily organs.   Routine symptom specific, illness specific and/or disease specific instructions were discussed with the patient and/or caregiver at length.   As such, the patient has been evaluated and assessed, work-up was performed and treatment was provided in alignment with urgent care protocols and evidence based medicine.  Patient/parent/caregiver has been advised that the patient may require follow up for further testing and treatment if the symptoms continue in spite of treatment, as clinically indicated and appropriate.  The patient was tested for COVID-19, Influenza and/or RSV, then the patient/parent/guardian was advised to isolate at home pending the results of his/her diagnostic coronavirus test and potentially longer if they're positive. I have also advised pt that if his/her COVID-19 test returns positive, it's recommended to self-isolate for at least 10 days after symptoms first appeared AND until fever-free for 24 hours without fever reducer AND other symptoms have improved or resolved. Discussed self-isolation recommendations as well as instructions for household member/close contacts as per the [facility] and [place] [facility], and also gave patient the [place] packet with this information.  Patient/parent/caregiver has been advised to return to the [facility] or PCP in 3-5 days if no better; to PCP or the Emergency Department if new signs and symptoms develop, or if the current signs or symptoms continue to change or worsen for further workup, evaluation and treatment as  clinically indicated and appropriate  The patient will follow up with their current PCP if and as advised. If the patient does not currently have a PCP we will assist them in obtaining one.   The patient may need specialty follow up if the symptoms continue, in spite of conservative treatment and management, for further workup, evaluation, consultation and treatment as clinically indicated and appropriate.  Patient/parent/caregiver  verbalized understanding and agreement of plan as discussed.  All questions were addressed during visit.  Please see discharge instructions below for further details of plan.  Discharge Instructions:   Discharge Instructions      Your symptoms are most consistent with a viral upper respiratory illness.  Due to the duration of of your symptoms, you would no longer benefit from antiviral treatment therefore viral testing is not indicated.  To address your significant nasal congestion, postnasal drip, fullness in your right ear and chronic cough, I recommend that you begin Atrovent nasal spray to dry up mucous membranes and reduce mucus production.  For your decreased breath sounds and inflammation of your upper airways, I also recommend that you begin a Medrol Dosepak, 1 row of tablets every day with breakfast beginning tomorrow morning.  Please remain home from work, school, public places until you have been fever free for 24 hours without the use of antifever medications such as Tylenol or ibuprofen.  I provided you with a note for work to return on Wednesday.  Conservative care is recommended at this time.  This includes rest, pushing clear fluids and activity as tolerated.  You may also noticed that your appetite is reduced, this is okay as long as they are drinking plenty of clear fluids.  Acetaminophen (Tylenol): This is a good fever reducer.  If there body temperature rises above 101.5 as measured with a thermometer, it is recommended that you give them 1,000 mg  every 6-8 hours until they are temperature falls below 101.5, please not take more than 3,000 mg of acetaminophen either as a separate medication or as in ingredient in an over-the-counter cold/flu preparation within a 24-hour period  Pseudoephedrine (Sudafed): This is a decongestant.  This medication has to be purchased from the pharmacist counter, I recommend giving 2 tablets, 60 mg, 2-3 times a day as needed to relieve runny nose and sinus drainage.  Guaifenesin (Robitussin, Mucinex): This is an expectorant.  This helps break up chest congestion and loosen up thick nasal drainage making phlegm and drainage more liquid and therefore easier to remove.  I recommend being 400 mg three times daily as needed.  Dextromethorphan (any cough medicine with the letters "DM" added to it's name such as Robitussin DM): This is a cough suppressant.  This is often recommended to be taken at nighttime to suppress cough and help children sleep.  Give dosage as directed on the bottle.   Chloraseptic Throat Spray: Spray 5 sprays into affected area every 2 hours, hold for 15 seconds and either swallow or spit it out.  This is a excellent numbing medication because it is a spray, you can put it right where you needed and so sucking on a lozenge and numbing your entire mouth.  Based on my physical exam findings and the history provided  today, I do not see any evidence of bacterial infection therefore treatment with antibiotics would be of no benefit.  Please follow-up within the next 3 to 5 days either with your primary care provider or urgent care if your symptoms do not resolve.  If you do not have a primary care provider, we will assist you in finding one.         Lynden Oxford Scales, PA-C 08/08/21 1327

## 2021-08-08 NOTE — Telephone Encounter (Signed)
Called to schedule 4 wk f/u, no answer, vm full. AW

## 2021-08-08 NOTE — ED Triage Notes (Signed)
Pt reports having body aches, headache, fever and chills for about 3 days.

## 2021-08-08 NOTE — Discharge Instructions (Addendum)
Your symptoms are most consistent with a viral upper respiratory illness.  Due to the duration of of your symptoms, you would no longer benefit from antiviral treatment therefore viral testing is not indicated.  To address your significant nasal congestion, postnasal drip, fullness in your right ear and chronic cough, I recommend that you begin Atrovent nasal spray to dry up mucous membranes and reduce mucus production.  For your decreased breath sounds and inflammation of your upper airways, I also recommend that you begin a Medrol Dosepak, 1 row of tablets every day with breakfast beginning tomorrow morning.  For your nighttime cough, you can take 5 mL of Promethazine DM at bedtime.  He can also take this several times throughout the day, if it makes you too sleepy during the daytime you can cut the dose in half.  Please remain home from work, school, public places until you have been fever free for 24 hours without the use of antifever medications such as Tylenol or ibuprofen.  I provided you with a note for work to return on Wednesday.  Conservative care is recommended at this time.  This includes rest, pushing clear fluids and activity as tolerated.  You may also noticed that your appetite is reduced, this is okay as long as they are drinking plenty of clear fluids.  Acetaminophen (Tylenol): This is a good fever reducer.  If there body temperature rises above 101.5 as measured with a thermometer, it is recommended that you give them 1,000 mg every 6-8 hours until they are temperature falls below 101.5, please not take more than 3,000 mg of acetaminophen either as a separate medication or as in ingredient in an over-the-counter cold/flu preparation within a 24-hour period  Pseudoephedrine (Sudafed): This is a decongestant.  This medication has to be purchased from the pharmacist counter, I recommend giving 2 tablets, 60 mg, 2-3 times a day as needed to relieve runny nose and sinus  drainage.  Guaifenesin (Robitussin, Mucinex): This is an expectorant.  This helps break up chest congestion and loosen up thick nasal drainage making phlegm and drainage more liquid and therefore easier to remove.  I recommend being 400 mg three times daily as needed.  Dextromethorphan (any cough medicine with the letters "DM" added to it's name such as Robitussin DM): This is a cough suppressant.  This is often recommended to be taken at nighttime to suppress cough and help children sleep.  Give dosage as directed on the bottle.   Chloraseptic Throat Spray: Spray 5 sprays into affected area every 2 hours, hold for 15 seconds and either swallow or spit it out.  This is a excellent numbing medication because it is a spray, you can put it right where you needed and so sucking on a lozenge and numbing your entire mouth.  Based on my physical exam findings and the history provided  today, I do not see any evidence of bacterial infection therefore treatment with antibiotics would be of no benefit.  Please follow-up within the next 3 to 5 days either with your primary care provider or urgent care if your symptoms do not resolve.  If you do not have a primary care provider, we will assist you in finding one.

## 2021-08-26 ENCOUNTER — Other Ambulatory Visit: Payer: Self-pay

## 2021-08-26 ENCOUNTER — Ambulatory Visit (HOSPITAL_COMMUNITY)
Admission: RE | Admit: 2021-08-26 | Discharge: 2021-08-26 | Disposition: A | Discharge status: Home / Self Care | Payer: No Typology Code available for payment source | Source: Ambulatory Visit | Attending: Interventional Radiology | Admitting: Interventional Radiology

## 2021-08-26 DIAGNOSIS — G932 Benign intracranial hypertension: Secondary | ICD-10-CM

## 2021-08-27 HISTORY — PX: IR RADIOLOGIST EVAL & MGMT: IMG5224

## 2021-09-02 ENCOUNTER — Other Ambulatory Visit: Payer: Self-pay | Admitting: Neurology

## 2021-09-06 ENCOUNTER — Other Ambulatory Visit: Payer: Self-pay | Admitting: Nurse Practitioner

## 2021-09-06 DIAGNOSIS — F419 Anxiety disorder, unspecified: Secondary | ICD-10-CM

## 2021-09-09 NOTE — Progress Notes (Signed)
Office Visit Note  Patient: Virginia Douglas             Date of Birth: 1975-11-21           MRN: [id number]             PCP: [name], FNP Referring: [name], FNP Visit Date: 09/23/2021 Occupation: @GUAROCC @  Subjective:  Generalized pain.   History of Present Illness: Virginia Douglas is a 46 y.o. female with a history of Raynauds, osteoarthritis, degenerative disc disease and myofascial pain syndrome.  She states she continues to have generalized pain and discomfort.  She also has intermittent Raynaud's phenomenon.  She has neck and lower back pain which persists.  She states in October she had some facial numbness and MRI showed some changes for that she underwent angioplasty for left sigmoid sinus stenosis.  She is on Plavix now.  She will have further work-up for the AVM.  Activities of Daily Living:  Patient reports morning stiffness for 30 minutes.   Patient Reports nocturnal pain.  Difficulty dressing/grooming: Reports Difficulty climbing stairs: Reports Difficulty getting out of chair: Reports Difficulty using hands for taps, buttons, cutlery, and/or writing: Reports  Review of Systems  Constitutional:  Positive for fatigue.  HENT:  Positive for mouth dryness and nose dryness. Negative for mouth sores.   Eyes:  Positive for pain, itching and dryness.  Respiratory:  Negative for shortness of breath and difficulty breathing.   Cardiovascular:  Negative for chest pain and palpitations.  Gastrointestinal:  Negative for blood in stool, constipation and diarrhea.  Endocrine: Positive for increased urination.  Genitourinary:  Negative for difficulty urinating.  Musculoskeletal:  Positive for joint pain, joint pain, joint swelling, myalgias, morning stiffness, muscle tenderness and myalgias.  Skin:  Positive for color change. Negative for rash and redness.  Allergic/Immunologic: Positive for susceptible to infections.  Neurological:  Positive for  dizziness, numbness, headaches, memory loss and weakness.  Hematological:  Positive for bruising/bleeding tendency.  Psychiatric/Behavioral:  Positive for confusion.    PMFS History:  Patient Active Problem List   Diagnosis Date Noted   Pulsatile tinnitus, bilateral 06/27/2021   DDD (degenerative disc disease), cervical 12/06/2020   DDD (degenerative disc disease), lumbar 12/06/2020   Gastroesophageal reflux disease with esophagitis without hemorrhage 12/06/2020   Increased intracranial pressure 12/06/2020   PTSD (post-traumatic stress disorder) 12/06/2020   Attention deficit hyperactivity disorder (ADHD), predominantly inattentive type 12/06/2020   Vitamin D deficiency 12/06/2020   Obesity (BMI 30-39.9) 03/15/2020   Sciatic nerve pain 12/10/2019   Acute pain of left shoulder 06/05/2019   Grief 06/05/2019   Migraine 06/05/2019   Type 2 diabetes mellitus without complication, without long-term current use of insulin ([place]) 04/07/2019   Depression 04/07/2019   Obesity due to excess calories without serious comorbidity 04/07/2019   Left leg pain 04/07/2019   Anxiety 04/07/2019   Headache 07/06/2018   Diabetes ([place]) 12/08/2015   Essential hypertension 12/08/2015   Smoker 12/08/2015   LLQ pain 12/08/2015   Paresthesia 09/16/2015   Urinary urgency 09/16/2015    Past Medical History:  Diagnosis Date   ADHD (attention deficit hyperactivity disorder)    Anemia    iron deficiency   as a teenager   Anxiety    Arthritis    Diabetes ([place])    Edema    GERD (gastroesophageal reflux disease)    Hypertension    patient denies- patient stated that the PCP said it was for heart prevention.  Migraine headache     Family History  Problem Relation Age of Onset   Diabetes Mother    Hypertension Mother    Diabetes Father    Hypertension Father    Kidney failure Father    Hypertension Brother    Diabetes Maternal Grandmother    Pancreatic cancer Maternal Grandmother    Diabetes  Paternal Grandmother    Breast cancer Paternal Grandmother    Stroke Paternal Grandfather    Cancer Maternal Aunt        breast   Breast cancer Maternal Aunt    Healthy Son    Healthy Son    Past Surgical History:  Procedure Laterality Date   ABDOMINAL HYSTERECTOMY     CHOLECYSTECTOMY     COLONOSCOPY  12/28/2020   polyps   ESOPHAGEAL DILATION     IR ANGIO INTRA EXTRACRAN SEL COM CAROTID INNOMINATE BILAT MOD SED  05/31/2021   IR ANGIO INTRA EXTRACRAN SEL COM CAROTID INNOMINATE BILAT MOD SED  06/27/2021   IR ANGIO VERTEBRAL SEL VERTEBRAL UNI R MOD SED  05/31/2021   IR CT HEAD LTD  06/27/2021   IR INTRA CRAN STENT  06/27/2021   IR RADIOLOGIST EVAL & MGMT  06/03/2021   IR RADIOLOGIST EVAL & MGMT  07/13/2021   IR RADIOLOGIST EVAL & MGMT  08/27/2021   IR US GUIDE VASC ACCESS RIGHT  05/31/2021   IR US GUIDE VASC ACCESS RIGHT  06/27/2021   OVARIAN CYST REMOVAL     RADIOLOGY WITH ANESTHESIA N/A 06/27/2021   Procedure: STENTING;  Surgeon: Luanne Bras, MD;  Location: [facility];  Service: Radiology;  Laterality: N/A;   Social History   Social History Narrative   In relationship, 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 She lives with her 2 children in a one story house. She drinks one large cup of coffee a day and an occasional tea or soda. She walks daily.      One story home      Immunization History  Administered Date(s) Administered   HPV 9-valent 04/11/2019   Influenza Inj Mdck Quad Pf 05/18/2018   Influenza,inj,Quad PF,6+ Mos 05/20/2017, 05/14/2019, 06/02/2020   Influenza-Unspecified 05/11/2017, 05/15/2018, 05/08/2019, 07/13/2021   Janssen (J&J) SARS-COV-2 Vaccination 11/23/2019   PFIZER(Purple Top)SARS-COV-2 Vaccination 07/11/2020   Pneumococcal Polysaccharide-23 04/08/2019, 07/15/2019   Tdap 07/02/2017, 04/09/2019      Objective: Vital Signs: BP 112/78 (BP Location: Left Arm, Patient Position: Sitting, Cuff Size: Large)    Pulse 93    Ht 5\' 6"  (1.676 m)    Wt 229 lb 3.2 oz (104 kg)    BMI 36.99 kg/m    Physical Exam Vitals and nursing note reviewed.  Constitutional:      Appearance: She is well-developed.  HENT:     Head: Normocephalic and atraumatic.  Eyes:     Conjunctiva/sclera: Conjunctivae normal.  Cardiovascular:     Rate and Rhythm: Normal rate and regular rhythm.     Heart sounds: Normal heart sounds.  Pulmonary:     Effort: Pulmonary effort is normal.     Breath sounds: Normal breath sounds.  Abdominal:     General: Bowel sounds are normal.     Palpations: Abdomen is soft.  Musculoskeletal:     Cervical back: Normal range of motion.  Lymphadenopathy:     Cervical:  No cervical adenopathy.  Skin:    General: Skin is warm and dry.     Capillary Refill: Capillary refill takes less than 2 seconds.     Comments: No nailbed capillary changes or sclerodactyly was noted.  She had good capillary refill.  No digital ulcers were noted.  Neurological:     Mental Status: She is alert and oriented to person, place, and time.  Psychiatric:        Behavior: Behavior normal.     Musculoskeletal Exam: She has limited range of motion of cervical and lumbar spine due to discomfort.  Shoulder joints, elbow joints, wrist joints, MCPs PIPs and DIPs with good range of motion with no synovitis.  Hip joints, knee joints, ankles, MTPs and PIPs with good range of motion with no synovitis.  She generalized hyperalgesia, positive tender points.  CDAI Exam: CDAI Score: -- Patient Global: --; Provider Global: -- Swollen: --; Tender: -- Joint Exam 09/23/2021   No joint exam has been documented for this visit   There is currently no information documented on the homunculus. Go to the Rheumatology activity and complete the homunculus joint exam.  Investigation: No additional findings.  Imaging: IR  Radiologist Eval & Mgmt  Result Date: 08/27/2021 EXAM: ESTABLISHED PATIENT OFFICE VISIT CHIEF COMPLAINT: Headaches. Current Pain Level: 1-10 HISTORY OF PRESENT ILLNESS: 46 year old right handed female status post endovascular revascularization of symptomatic high-grade stenosis of the left sigmoid sinus on 06/27/2021. She reports no sensation of pulsatile tinnitus on the left side. She also reports that the previous headaches that she had prior to the procedure are no longer present. She does, however, complain of a recent onset of what she describes as intermittent bioccipital pain which radiates anteriorly and superiorly over her vertex. These tend to be sharp in nature and may last for a few hours with some nausea. However, she reports no visual symptoms or of vertigo, or sensation of loss of awareness, or seizure-like activity. Denies any change in her speech, or limb numbness or weakness. She has had 3 episodes over the past couple of weeks. Headaches are relieved by RELPAX prescribed by her neurologist. She continues to have debilitating right-sided pulsatile tinnitus which interferes with her work and interferes with her sleep. Denies any other new symptoms since her previous visit about 4 weeks ago. Past Medical History: As documented. Medications: As documented. Allergies: As documented. Social History: As documented. Family History:  As previously. REVIEW OF SYSTEMS: Negative unless as mentioned above. PHYSICAL EXAMINATION: Grossly neurologically intact. ASSESSMENT AND PLAN: Have advised patient to continued taking her medications as prescribed including aspirin and Plavix. Will obtain an MRV of the brain pre and post contrast in mid January. Depending on the MRV scan will proceed with treatment of the right transverse sinus sigmoid sinus severe stenosis versus proceeding with consultation for her brain vascular malformation for radiosurgery at [facility]. Electronically Signed   By: Luanne Bras M.D.    On: 08/27/2021 17:36    Recent Labs: Lab Results  Component Value Date   WBC 9.9 07/19/2021   HGB 14.0 07/19/2021   PLT 375 07/19/2021   NA 139 07/19/2021   K 3.9 07/19/2021   CL 102 07/19/2021   CO2 19 (L) 07/19/2021   GLUCOSE 132 (H) 07/19/2021   BUN 8 07/19/2021   CREATININE 0.65 07/19/2021   BILITOT 0.2 07/19/2021   ALKPHOS 169 (H) 07/19/2021   AST 25 07/19/2021   ALT 26 07/19/2021   PROT 7.5 07/19/2021  ALBUMIN 4.8 07/19/2021   CALCIUM 10.3 (H) 07/19/2021   GFRAA 89 07/15/2020    October 05, 2020 ANA negative, double-stranded DNA negative, complements normal, RF negative, sed rate 13, CRP 10 12/06/2020 CK normal May 2022 cryoglobulin not detected, beta-2 glycoprotein antibodies negative, cardiolipin antibodies negative, SCL 70 negative, lupus anticoagulant not detected, and ANCA screen negative.  Speciality Comments: No specialty comments available.  Procedures:  No procedures performed Allergies: Patient has no known allergies.   Assessment / Plan:     Visit Diagnoses: Raynaud's disease without gangrene -previous records are reviewed.  All autoimmune work-up was negative in the past.  Cryoglobulin not detected, beta-2 glycoprotein antibodies negative, cardiolipin antibodies negative, SCL 70 negative, lupus anticoagulant not detected, ANCA, ANA negative, dsDNA negative, RF negative, CK normal.  All autoimmune work-up in the past has been negative.  She gives history of intermittent raynaud's phenominon.  No nailbed capillary changes, sclerodactyly was noted.  She had good capillary refill.  There is no history of digital ulcers.  Chronic pain of both knees-x-rays obtained on November 11, 2020 were reviewed which were within normal limits.  I believe the discomfort probably is coming from underlying myofascial pain.  DDD (degenerative disc disease), cervical - MRI 03/20/21.  Has tried PT.  Sees pain managment.   DDD (degenerative disc disease), lumbar - L5-S1 bilateral  foraminal stenosis was noted on her MRI May 09, 2020.  Levoscoliosis  Myofascial pain-she had generalized pain and hyperalgesia.  She had multiple tender points.  I will refer her to integrative therapies.  Stretching exercise, water aerobics and swimming was also discussed.  Essential hypertension-blood pressure was normal today.  Type 2 diabetes mellitus without complication, without long-term current use of insulin ([facility])  Increased intracranial pressure  AVM (arteriovenous malformation) brain - And left sigmoid sinus stenosis status post angioplasty June 27, 2021  Hx of migraine headaches  Paresthesia  Attention deficit hyperactivity disorder (ADHD), predominantly inattentive type  Gastroesophageal reflux disease with esophagitis without hemorrhage  PTSD (post-traumatic stress disorder)  Anxiety and depression  Former smoker  Orders: No orders of the defined types were placed in this encounter.  No orders of the defined types were placed in this encounter.    Follow-Up Instructions: Return in about 6 months (around 03/23/2022) for raynauds phenomenon.   Bo Merino, MD  Note - This record has been created using Editor, commissioning.  Chart creation errors have been sought, but may not always  have been located. Such creation errors do not reflect on  the standard of medical care.

## 2021-09-20 ENCOUNTER — Encounter: Payer: Self-pay | Admitting: Neurology

## 2021-09-21 ENCOUNTER — Telehealth (HOSPITAL_COMMUNITY): Payer: Self-pay

## 2021-09-21 ENCOUNTER — Other Ambulatory Visit (INDEPENDENT_AMBULATORY_CARE_PROVIDER_SITE_OTHER): Payer: No Typology Code available for payment source

## 2021-09-21 ENCOUNTER — Other Ambulatory Visit: Payer: Self-pay

## 2021-09-21 DIAGNOSIS — R809 Proteinuria, unspecified: Secondary | ICD-10-CM

## 2021-09-21 LAB — POCT URINALYSIS DIPSTICK
Bilirubin, UA: NEGATIVE
Blood, UA: NEGATIVE
Glucose, UA: NEGATIVE
Ketones, UA: NEGATIVE
Leukocytes, UA: NEGATIVE
Nitrite, UA: NEGATIVE
Protein, UA: NEGATIVE
Spec Grav, UA: 1.02 (ref 1.010–1.025)
Urobilinogen, UA: 0.2 E.U./dL
pH, UA: 5 (ref 5.0–8.0)

## 2021-09-21 MED ORDER — AIMOVIG 140 MG/ML ~~LOC~~ SOAJ: 140.0000 mg | SUBCUTANEOUS | 2 refills | Status: DC

## 2021-09-21 NOTE — Telephone Encounter (Signed)
Returned pt's call, informed her that we are still waiting on insurance auth. I will give her a call back to schedule her mri once approved. She agreed. AW

## 2021-09-21 NOTE — Progress Notes (Signed)
Per Dr.Jaffe okay for patient to restart Aimovig 140 mg.

## 2021-09-22 ENCOUNTER — Other Ambulatory Visit (HOSPITAL_COMMUNITY): Payer: Self-pay | Admitting: Interventional Radiology

## 2021-09-22 ENCOUNTER — Telehealth (HOSPITAL_COMMUNITY): Payer: Self-pay

## 2021-09-22 DIAGNOSIS — G932 Benign intracranial hypertension: Secondary | ICD-10-CM

## 2021-09-22 NOTE — Telephone Encounter (Signed)
Called to schedule mrv, no answer, left vm. AW  

## 2021-09-23 ENCOUNTER — Ambulatory Visit (INDEPENDENT_AMBULATORY_CARE_PROVIDER_SITE_OTHER): Payer: No Typology Code available for payment source | Admitting: Rheumatology

## 2021-09-23 ENCOUNTER — Other Ambulatory Visit: Payer: Self-pay

## 2021-09-23 ENCOUNTER — Encounter: Payer: Self-pay | Admitting: Rheumatology

## 2021-09-23 ENCOUNTER — Telehealth: Payer: Self-pay

## 2021-09-23 VITALS — BP 112/78 | HR 93 | Ht 66.0 in | Wt 229.2 lb

## 2021-09-23 DIAGNOSIS — M25561 Pain in right knee: Secondary | ICD-10-CM | POA: Diagnosis not present

## 2021-09-23 DIAGNOSIS — G8929 Other chronic pain: Secondary | ICD-10-CM

## 2021-09-23 DIAGNOSIS — Q282 Arteriovenous malformation of cerebral vessels: Secondary | ICD-10-CM

## 2021-09-23 DIAGNOSIS — M5030 Other cervical disc degeneration, unspecified cervical region: Secondary | ICD-10-CM | POA: Diagnosis not present

## 2021-09-23 DIAGNOSIS — F4310 Post-traumatic stress disorder, unspecified: Secondary | ICD-10-CM

## 2021-09-23 DIAGNOSIS — I7300 Raynaud's syndrome without gangrene: Secondary | ICD-10-CM | POA: Diagnosis not present

## 2021-09-23 DIAGNOSIS — F419 Anxiety disorder, unspecified: Secondary | ICD-10-CM

## 2021-09-23 DIAGNOSIS — F32A Depression, unspecified: Secondary | ICD-10-CM

## 2021-09-23 DIAGNOSIS — G932 Benign intracranial hypertension: Secondary | ICD-10-CM

## 2021-09-23 DIAGNOSIS — M7918 Myalgia, other site: Secondary | ICD-10-CM

## 2021-09-23 DIAGNOSIS — M51369 Other intervertebral disc degeneration, lumbar region without mention of lumbar back pain or lower extremity pain: Secondary | ICD-10-CM

## 2021-09-23 DIAGNOSIS — R202 Paresthesia of skin: Secondary | ICD-10-CM

## 2021-09-23 DIAGNOSIS — I10 Essential (primary) hypertension: Secondary | ICD-10-CM

## 2021-09-23 DIAGNOSIS — F900 Attention-deficit hyperactivity disorder, predominantly inattentive type: Secondary | ICD-10-CM

## 2021-09-23 DIAGNOSIS — K2100 Gastro-esophageal reflux disease with esophagitis, without bleeding: Secondary | ICD-10-CM

## 2021-09-23 DIAGNOSIS — Z8669 Personal history of other diseases of the nervous system and sense organs: Secondary | ICD-10-CM

## 2021-09-23 DIAGNOSIS — M5136 Other intervertebral disc degeneration, lumbar region: Secondary | ICD-10-CM

## 2021-09-23 DIAGNOSIS — Z87891 Personal history of nicotine dependence: Secondary | ICD-10-CM

## 2021-09-23 DIAGNOSIS — E119 Type 2 diabetes mellitus without complications: Secondary | ICD-10-CM

## 2021-09-23 DIAGNOSIS — M25562 Pain in left knee: Secondary | ICD-10-CM

## 2021-09-23 NOTE — Telephone Encounter (Signed)
F/u  Virginia Douglas (Key: [id number]) Aimovig 140MG /ML auto-injectors   Form [employer] Electronic PA Form (2017 NCPDP) Created 7 minutes ago Sent to Plan 5 minutes ago Plan Response 5 minutes ago Submit Clinical Questions 1 minute ago Determination Wait for Determination Please wait for [employer] NCPDP 2017 to return a determination.

## 2021-09-23 NOTE — Telephone Encounter (Signed)
New message   Virginia Douglas (Key: [id number]) Aimovig 140MG /ML auto-injectors   Form [employer] fundraiser PA Form (2017 NCPDP) Created 7 minutes ago Sent to Plan 5 minutes ago Plan Response 5 minutes ago Submit Clinical Questions 1 minute ago Determination Wait for Determination Please wait for [employer] NCPDP 2017 to return a determination.   Your information has been submitted to [facility]. To check for an updated outcome later, reopen this PA request from your dashboard.  If [employer] has not responded to your request within 24 hours, contact [facility] at [phone]. If you think there may be a problem with your PA request, use our live chat feature at the bottom right.

## 2021-09-26 ENCOUNTER — Ambulatory Visit (HOSPITAL_COMMUNITY)
Admission: RE | Admit: 2021-09-26 | Discharge: 2021-09-26 | Disposition: A | Discharge status: Home / Self Care | Payer: No Typology Code available for payment source | Source: Ambulatory Visit | Attending: Interventional Radiology | Admitting: Interventional Radiology

## 2021-09-26 ENCOUNTER — Other Ambulatory Visit: Payer: Self-pay

## 2021-09-26 ENCOUNTER — Other Ambulatory Visit: Payer: Self-pay | Admitting: Nurse Practitioner

## 2021-09-26 DIAGNOSIS — G932 Benign intracranial hypertension: Secondary | ICD-10-CM | POA: Insufficient documentation

## 2021-09-26 IMAGING — MR MR MRV HEAD WO/W CM
3 of 4 series · 19 of 48 positions shown · IV contrast (10 ML GAD)
Comparison: [DATE] MRI brain

CLINICAL DATA: Bilateral tenderness with sigmoid sinus stenosis,
post stenting on left.

EXAM:
MR VENOGRAM HEAD WITHOUT AND WITH CONTRAST
TECHNIQUE: Angiographic images of the intracranial venous structures were
acquired using MRV technique without and with intravenous contrast.
CONTRAST:  10mL GADAVIST GADOBUTROL 1 MMOL/ML IV SOLN

[Series 3: sag inhance (id) · sagittal · 1.8mm · 0.47mm/px · 11 of 329 slices shown]
[im 21/329]
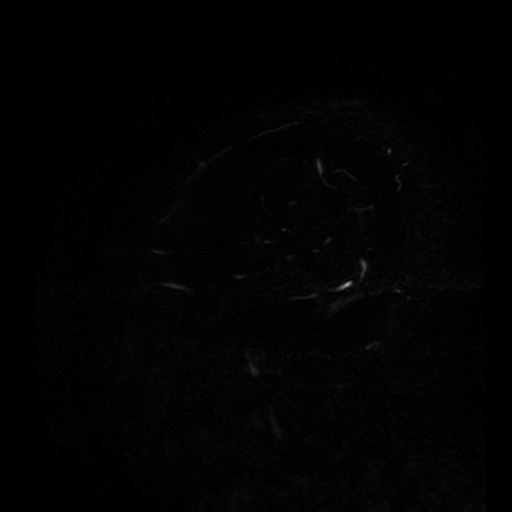
[im 42/329]
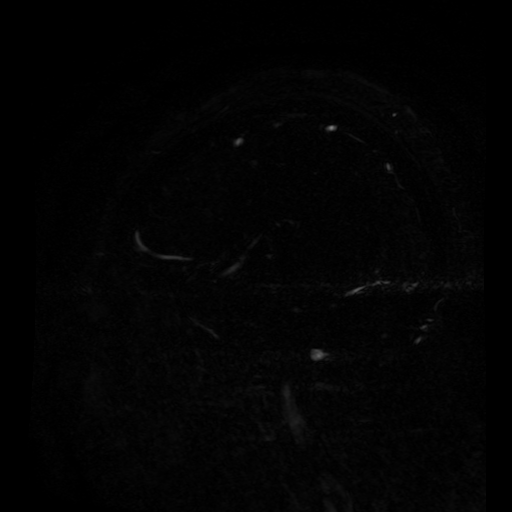
[im 62/329]
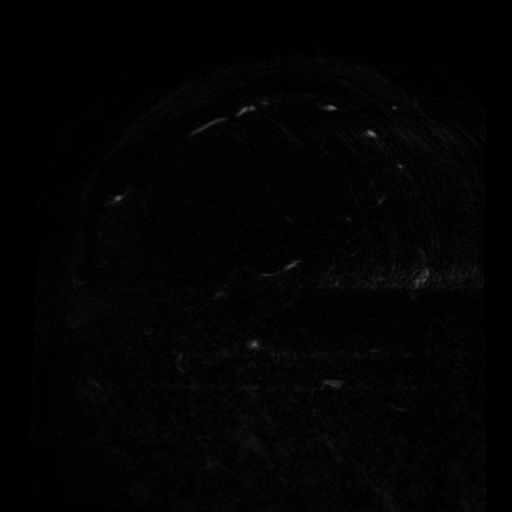
[im 103/329]
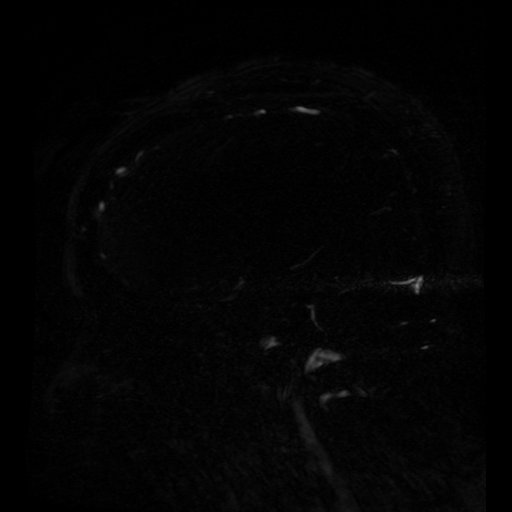
[im 144/329]
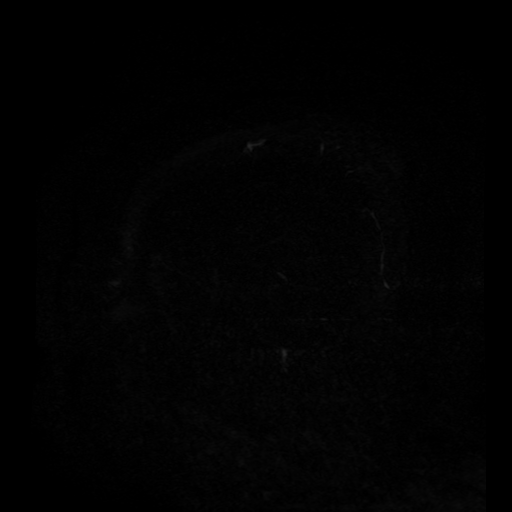
[im 165/329]
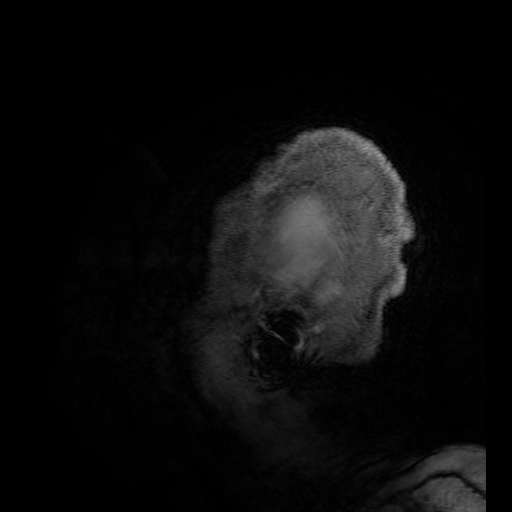
[im 185/329]
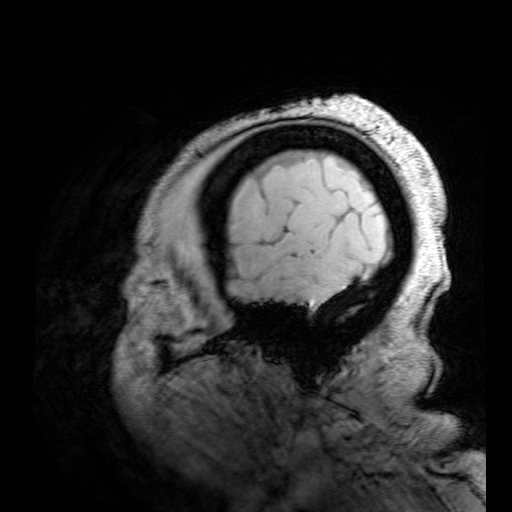
[im 226/329]
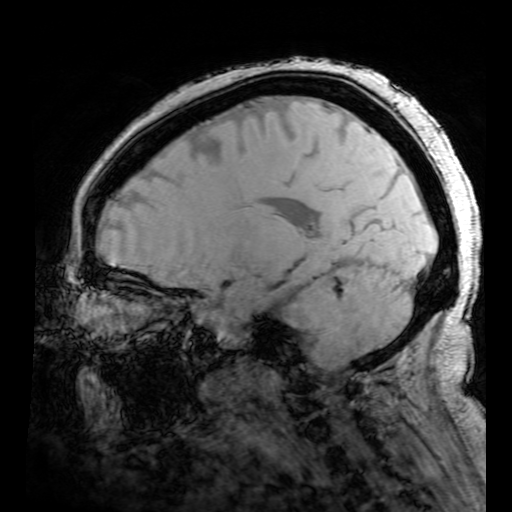
[im 267/329]
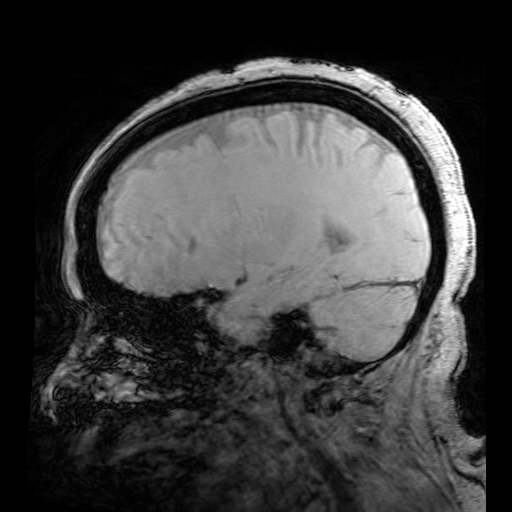
[im 288/329]
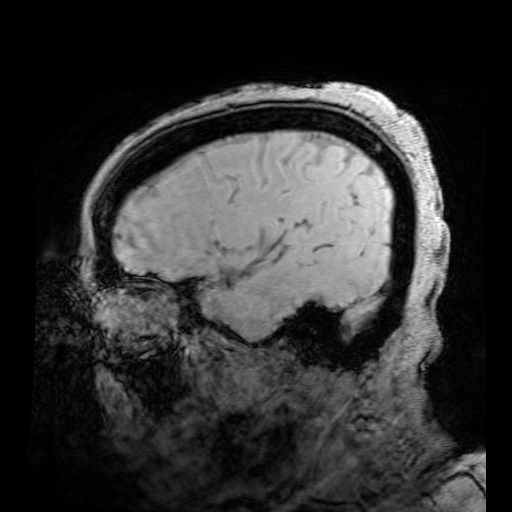
[im 308/329]
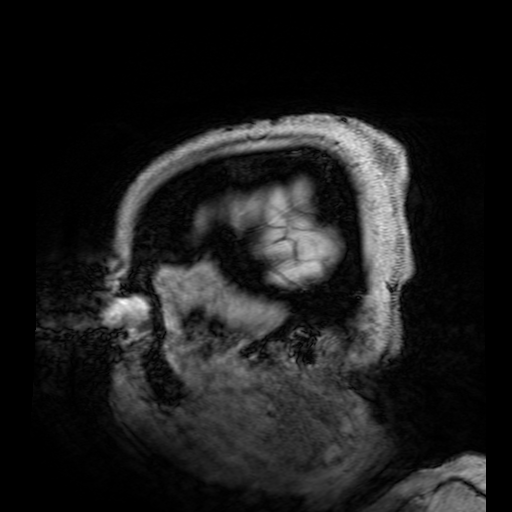

[Series 400: multiplanar reconstruction (mpr) · sagittal · 0.9mm · 0.47mm/px · 5 of 205 slices shown (1 of 2)]
[im 1/205]
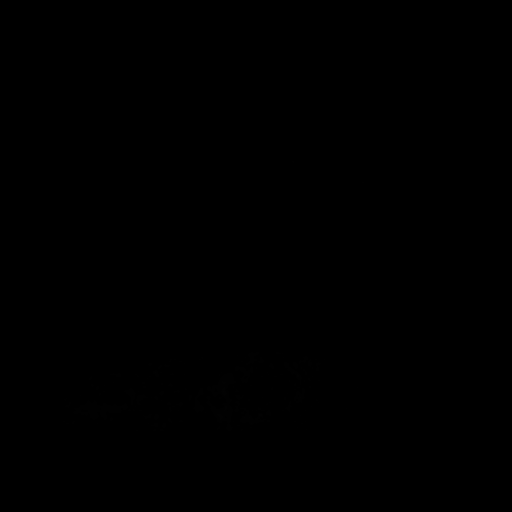
[im 23/205]
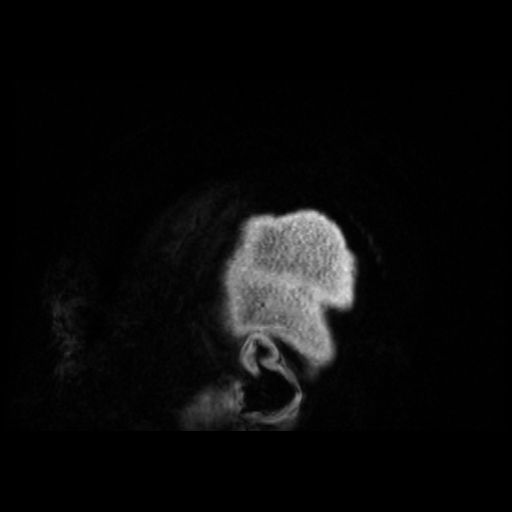
[im 69/205]
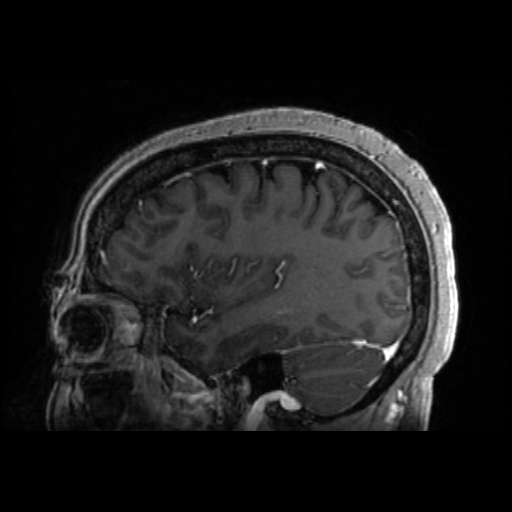
[im 114/205]
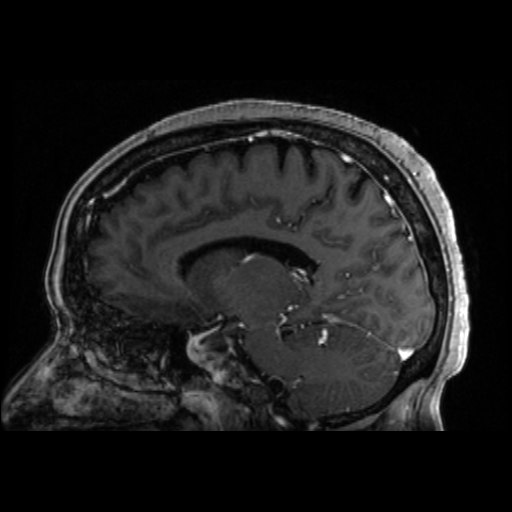
[im 182/205]
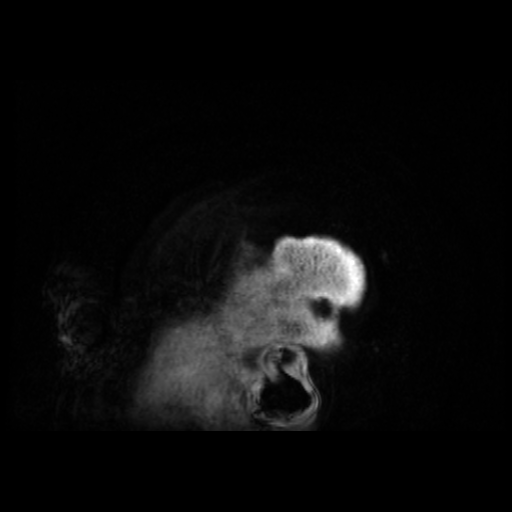

[Series 401: multiplanar reconstruction (mpr) · coronal · 0.9mm · 0.47mm/px · 3 of 255 slices shown (2 of 2)]
[im 43/255]
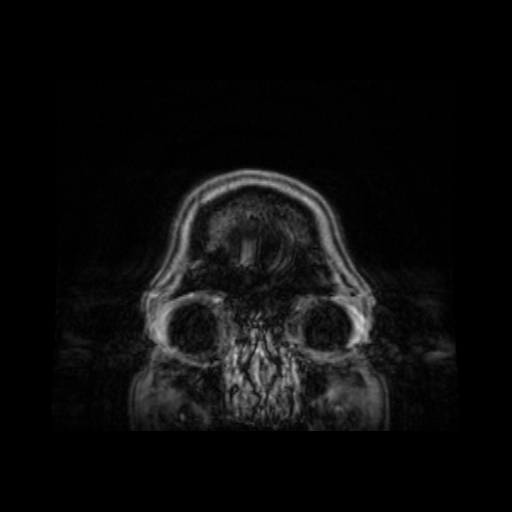
[im 128/255]
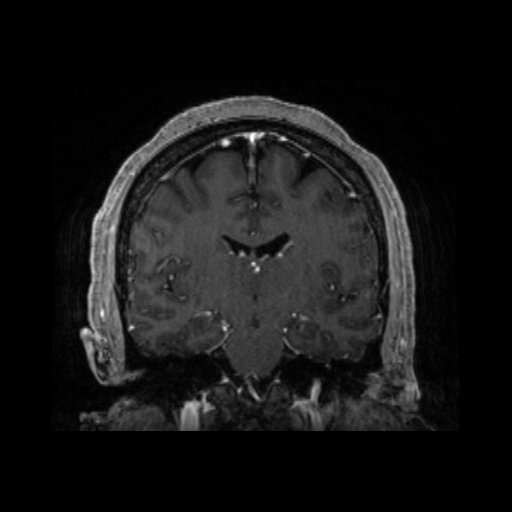
[im 212/255]
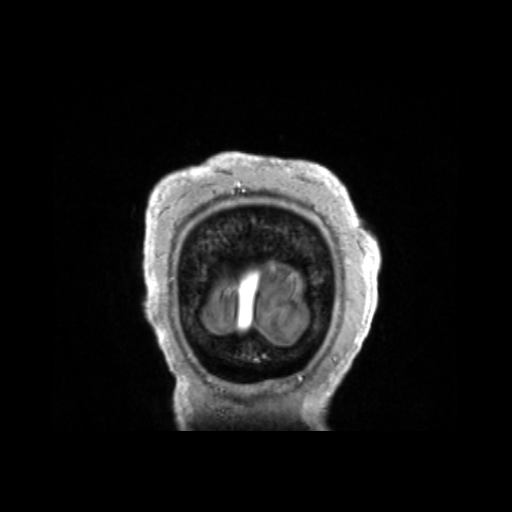

[19 of 48 positions shown; findings below may reference images not displayed]

FINDINGS: Interval placement of a stent spanning the distal left transverse
sinus and proximal sigmoid sinus. There is no enhancement within the
stented portion but patency is presumed given enhancement beyond.

Right transverse and sigmoid sinuses are patent. There is marked
distal right transverse sinus stenosis.

Superior sagittal sinus, straight sinus, vein of LYNDEN, and internal
cerebral veins are patent.

Left superior cerebellar vascular lesion is again identified
reflecting arteriovenous malformation seen on catheter angiogram.
Compared to the prior study, there is decreased size of the focal
area of venous ectasia or aneurysm formation seen on the prior
study.
IMPRESSION: Interval distal left transverse sinus and proximal sigmoid sinus
stent placement. Stent is presumed to be patent given enhancement
beyond.

Marked distal right transverse sinus stenosis.

Known left cerebellar AVM is incidentally identified. Compared to
the prior study, there is decreased size of a focal area of venous
ectasia or aneurysm formation.

## 2021-09-26 MED ORDER — GADOBUTROL 1 MMOL/ML IV SOLN
10.0000 mL | Freq: Once | INTRAVENOUS | Status: AC | PRN
  Administered 2021-09-26: 10 mL via INTRAVENOUS

## 2021-10-03 ENCOUNTER — Telehealth (HOSPITAL_COMMUNITY): Payer: Self-pay

## 2021-10-03 ENCOUNTER — Other Ambulatory Visit (HOSPITAL_COMMUNITY): Payer: Self-pay | Admitting: Interventional Radiology

## 2021-10-03 DIAGNOSIS — I771 Stricture of artery: Secondary | ICD-10-CM

## 2021-10-03 DIAGNOSIS — G932 Benign intracranial hypertension: Secondary | ICD-10-CM

## 2021-10-03 NOTE — Telephone Encounter (Signed)
Returned pt's call, no answer, left vm. AW  

## 2021-10-03 NOTE — Telephone Encounter (Signed)
F/u   Approved 1.22.2023 to  1.22.2024   [facility] [employer]

## 2021-10-10 ENCOUNTER — Telehealth: Payer: Self-pay

## 2021-10-10 NOTE — Telephone Encounter (Signed)
Patient notified that the prior auth for her ozempic has been completed and approved. YL,RMA

## 2021-10-12 ENCOUNTER — Other Ambulatory Visit: Payer: Self-pay

## 2021-10-12 ENCOUNTER — Ambulatory Visit (HOSPITAL_COMMUNITY)
Admission: RE | Admit: 2021-10-12 | Discharge: 2021-10-12 | Disposition: A | Discharge status: Home / Self Care | Payer: No Typology Code available for payment source | Source: Ambulatory Visit | Attending: Interventional Radiology | Admitting: Interventional Radiology

## 2021-10-12 DIAGNOSIS — G932 Benign intracranial hypertension: Secondary | ICD-10-CM

## 2021-10-12 DIAGNOSIS — I771 Stricture of artery: Secondary | ICD-10-CM

## 2021-10-21 ENCOUNTER — Other Ambulatory Visit: Payer: Self-pay | Admitting: Neurology

## 2021-10-21 MED ORDER — CLOPIDOGREL BISULFATE 75 MG PO TABS: 75.0000 mg | ORAL_TABLET | Freq: Every day | ORAL | 0 refills | Status: DC

## 2021-10-21 NOTE — Progress Notes (Signed)
Patient ID: Virginia Douglas, female   DOB: [birth year], 46 y.o.   MRN: [id number]   Pt called requesting Plavix refill. Refill was sent to pharmacy on file. Pt notified that prescription was refilled.     Narda Rutherford, AGNP-BC 10/21/2021, 2:09 PM

## 2021-10-24 ENCOUNTER — Other Ambulatory Visit: Payer: Self-pay | Admitting: Radiology

## 2021-10-24 ENCOUNTER — Other Ambulatory Visit: Payer: Self-pay | Admitting: Physician Assistant

## 2021-10-24 MED ORDER — CLOPIDOGREL BISULFATE 75 MG PO TABS: 75.0000 mg | ORAL_TABLET | Freq: Every day | ORAL | 3 refills | Status: DC

## 2021-10-28 ENCOUNTER — Other Ambulatory Visit: Payer: Self-pay

## 2021-10-28 ENCOUNTER — Ambulatory Visit (HOSPITAL_COMMUNITY)
Admission: RE | Admit: 2021-10-28 | Discharge: 2021-10-28 | Disposition: A | Discharge status: Home / Self Care | Payer: No Typology Code available for payment source | Source: Ambulatory Visit | Attending: Interventional Radiology | Admitting: Interventional Radiology

## 2021-10-30 HISTORY — PX: IR RADIOLOGIST EVAL & MGMT: IMG5224

## 2021-10-31 ENCOUNTER — Encounter: Payer: Self-pay | Admitting: Neurology

## 2021-11-03 ENCOUNTER — Other Ambulatory Visit: Payer: Self-pay | Admitting: Internal Medicine

## 2021-11-03 ENCOUNTER — Other Ambulatory Visit: Payer: Self-pay | Admitting: Nurse Practitioner

## 2021-11-03 DIAGNOSIS — I10 Essential (primary) hypertension: Secondary | ICD-10-CM

## 2021-11-03 DIAGNOSIS — E119 Type 2 diabetes mellitus without complications: Secondary | ICD-10-CM

## 2021-11-07 NOTE — Progress Notes (Signed)
Due to the COVID-19 crisis, this telephone visit was done via telephone from my office and it was initiated and consent given by this patient and or family.  Telephone (Audio) Visit Unable to connect with patient for video visit.  The purpose of this telephone visit is to provide medical care while limiting exposure to the novel coronavirus.    Consent was obtained for telephone visit and initiated by pt/family:  Yes.   Answered questions that patient had about telehealth interaction:  Yes.   I discussed the limitations, risks, security and privacy concerns of performing an evaluation and management service by telephone. I also discussed with the patient that there may be a patient responsible charge related to this service. The patient expressed understanding and agreed to proceed.  Pt location: Home Physician Location: office Name of referring provider:  [name], FNP I connected with .Virginia Douglas at patients initiation/request on 11/08/2021 at  3:30 PM EST by telephone and verified that I am speaking with the correct person using two identifiers.  [name]:  [id number] Pt DOB:  August 04, 1976  Assessment/Plan:   Migraine without aura, without status migrainosus, not intractable Bilateral sigmoid sinus stenosis s/p stent on left, plan for right as well Left PCA AVM History of idiopathic intracranial hypertension Chronic low back pain with history of bilateral lumbosacral radiculopathy   Migraine prevention:  Aimovig '140mg'$ , topiramate '125mg'$  daily Migraine rescue:  Reyvow IIH therapy:  acetazolamide '250mg'$  BID. Patient requests to remain on acetazolamide despite already taking topiramate and eye exams without papilledema.  Serum bicarb levels typically borderline low (19-20).  Continue to monitor. Neuropathic pain:  Cymbalta '60mg'$  daily Limit use of pain relievers to no more than 2 days out of week to prevent risk of rebound or medication-overuse headache. Keep headache  diary Follow up with ophthalmologist Follow up with Dr. Estanislado Pandy Follow up with me in 6 months.     Subjective:  Virginia Douglas is a 46 year old right-handed woman with diabetes and hypertension who follows up for migraines.   UPDATE: Due to right sided facial numbness and weakness, she had an MRI of the brain with and without contrast on 05/19/2021 which was personally reviewed and showed a small 8 mm enhancing vascular nodule within the left superior cerebellum.  She was referred to endovascular radiology.  Cerebral angiogram revealed a 3.4 mm x 4.3 mm AVM involving the left posterior cerebral artery P3 segment and high-grade left sigmoid sinus proximally and moderate to severe stenosis of right sigmoid sinus proximally.  She did endorse pulsatile tinnitus in the left ear.  She underwent endovascular revascularization of the high-grade stenosis of the left sigmoid sinus on 06/27/2021.  Headache and pulsatile tinnitus resolved.  MRV of head on 09/26/2021 personally reviewed showed stable and patent stent placement of the distal left transverse sinus and proximal sigmoid sinus stent placement.  She also noted pulsatile tinnitus in her right ear, so she underwent endovascular revascularization of the right transverse sinus sigmoid sinus junction stenosis on 10/28/2021.  Plan is to send her to [facility] for evaluation and treatment of the AVM.   She is doing well. In May, her insurance no longer would cover Aimovig.  She was instead started on Ajovy.  It was effective, but with 2023, insurance now prefers Aimovig, so she is back on Aimovig.   Intensity:  severe Duration:  30 to 60 minutes with Reyvow Frequency:  once a week  Current NSAIDS:  none Current analgesics:  none Other current abortive:  none Current triptans: none Current ergotamine: None Current anti-emetic: Zofran 4 mg Current muscle relaxants: Robaxin Current anti-anxiolytic:  alprazolam Current sleep aide:  none Current  Antihypertensive medications:  Lisinopril-HCTZ Current Antidepressant medications:  Cymbalta '60mg'$  daily Current Anticonvulsant medications: Topiramate '125mg'$  at bedtime, acetazolamide '250mg'$  twice daily, Lyrica '100mg'$  three times daily. Current anti-CGRP:  Ajovy Current Vitamins/Herbal/Supplements:  turmeric Current Antihistamines/Decongestants:  none Other therapy:  Reyvow (rescue) Hormone/birth control:  no    Caffeine: One soda a week Diet: Hydrates Exercise: Yes Depression: Yes; Anxiety: Yes Other pain: No Sleep hygiene: Varies   HISTORY: Onset: Remote history of migraines.  Controlled for many years.  Returned in May 2019.Marland Kitchen Location:  Right sided head/ear radiating down right side of neck.  Does not radiate down the right arm. Quality:  Pounding in head, burning in neck Initial intensity:  Severe.  She denies  thunderclap headache or severe headache that wakes her from sleep. Aura:  no Prodrome:  no Postdrome:  no Associated symptoms:  Nausea, vomiting, photophobia, phonophobia, blurred vision.  She denies associated unilateral numbness or weakness. Initial duration:  1 day Initial Frequency:  2 days a week Initial Frequency of abortive medication: 2 days a week Triggers:  Emotional stress Relieving factors:  Heating pad, Robaxin Activity:  Aggravates   She was evaluated in the ED on 02/10/18, where CTA of head and neck was performed and personally reviewed.  It demonstrated empty sella but otherwise unremarkable for mass lesion, aneurysm or dissection.  She was diagnosed with idiopathic intracranial hypertension diagnosed in 2019.  She is on Diamox '250mg'$  daily.  She is followed by ophthalmology.  Exam note from 10/10/18 stated "maybe slight nasal nerve elevation" but no definite signs of papilledema.   She has longstanding history of numbness and tingling in the feet.  NCV-EMG of lower extremities in 2017 was normal.  She reports left sided low back pain radiating down the side of  the left leg with associated numbness in the side of leg and foot since early 2020  She had a lumbar X-ray which demonstrated mild dextroscoliosis apex L4.  No injury.  She also has endorsed right sided leg pain as well.  MRI of lumbar spine was performed on 05/09/2020 which was personally reviewed and showed mild lumbar spondylosis and degenerative disc disease with very mild bilateral foraminal stenosis at L5-S1.  She followed up with her PCP regarding right knee pain and swelling.  X-ray on 09/29/2020 was negative.  She reports generalized pain and numbness and tingling.  If she holds something, she may drop it.  Labs from 2021 include B12 388,  TSH  0.872, ANA negative, negative dsDNA, sed rate 13, CRP 10, RF negative.  Hgb A1c has increased over 2021 from 5.8 to 6.4.  Gabapentin previously used was ineffective for neuropathic pain.  She had a repeat NCV-EMG of the right upper and lower extremities on 01/04/2021, which was normal.   In June 2022, she woke up one morning and noted the right sided of her face was numb and mouth twisted.  She had difficulty drinking fluids and would drip out the side of her mouth.  She is not sure if she had trouble closing her eye or raising her eyebrow.  She had a slight headache but no facial pain, slurred speech, language difficulty or involvement of arm or leg.  It lasted about a week or a little bit longer.      Past NSAIDS:  Ibuprofen, naproxen Past analgesics:  tramadol  $'50mg'd$  Past abortive triptans: Sumatriptan 100 mg, rizatriptan '10mg'$ , eletriptan '40mg'$  Past muscle relaxants:  Flexeril Past anti-emetic:  no Past antihypertensive medications:  no Past antidepressant medications:  nortriptyline '50mg'$  (caused nausea/vomiting, problems sleeping) Past anticonvulsant medications:  gabapentin '300mg'$  twice daily (for neuropathic pain) Past CGRP inhibitor:  Aimovig '140mg'$  (effective but insurance no longer would cover) Past vitamins/Herbal/Supplements:  no Past  antihistamines/decongestants:  no Other past therapies:  no   Family history of headache:  no   MRI of cervical spine from 09/22/15 was personally reviewed and was unremarkable.   Objective:   Vitals:   11/08/21 1424  Weight: 220 lb (99.8 kg)  Height: 5' 6.5" (1.689 m)     Follow Up Instructions:      -I discussed the assessment and treatment plan with the patient. The patient was provided an opportunity to ask questions and all were answered. The patient agreed with the plan and demonstrated an understanding of the instructions.   The patient was advised to call back or seek an in-person evaluation if the symptoms worsen or if the condition fails to improve as anticipated.    Total Time spent in visit with the patient was:  8 minutes    Dudley Major, DO

## 2021-11-08 ENCOUNTER — Telehealth (INDEPENDENT_AMBULATORY_CARE_PROVIDER_SITE_OTHER): Payer: No Typology Code available for payment source | Admitting: Neurology

## 2021-11-08 ENCOUNTER — Encounter: Payer: Self-pay | Admitting: Neurology

## 2021-11-08 ENCOUNTER — Other Ambulatory Visit: Payer: Self-pay

## 2021-11-08 VITALS — Ht 66.5 in | Wt 220.0 lb

## 2021-11-08 DIAGNOSIS — G43009 Migraine without aura, not intractable, without status migrainosus: Secondary | ICD-10-CM

## 2021-11-08 DIAGNOSIS — G932 Benign intracranial hypertension: Secondary | ICD-10-CM

## 2021-11-08 DIAGNOSIS — Q282 Arteriovenous malformation of cerebral vessels: Secondary | ICD-10-CM | POA: Diagnosis not present

## 2021-11-09 ENCOUNTER — Other Ambulatory Visit (HOSPITAL_COMMUNITY): Payer: Self-pay | Admitting: Interventional Radiology

## 2021-11-09 DIAGNOSIS — I771 Stricture of artery: Secondary | ICD-10-CM

## 2021-11-09 LAB — HM DIABETES EYE EXAM

## 2021-11-14 ENCOUNTER — Other Ambulatory Visit (HOSPITAL_COMMUNITY): Payer: Self-pay

## 2021-11-14 DIAGNOSIS — I771 Stricture of artery: Secondary | ICD-10-CM

## 2021-11-16 ENCOUNTER — Ambulatory Visit: Payer: No Typology Code available for payment source | Admitting: Nurse Practitioner

## 2021-11-17 ENCOUNTER — Other Ambulatory Visit: Payer: Self-pay | Admitting: Radiology

## 2021-11-17 LAB — PLATELET INHIBITION P2Y12

## 2021-11-18 ENCOUNTER — Encounter (HOSPITAL_COMMUNITY): Payer: Self-pay | Admitting: Interventional Radiology

## 2021-11-18 ENCOUNTER — Other Ambulatory Visit (HOSPITAL_COMMUNITY): Payer: Self-pay | Admitting: Interventional Radiology

## 2021-11-18 NOTE — Progress Notes (Signed)
DUE TO COVID-19 ONLY ONE VISITOR IS ALLOWED TO COME WITH YOU AND STAY IN THE WAITING ROOM ONLY DURING PRE OP AND PROCEDURE DAY OF SURGERY.  ? ?Two VISITORS MAY VISIT WITH YOU AFTER SURGERY IN YOUR PRIVATE ROOM DURING VISITING HOURS ONLY! ? ?PCP - [name], FNP ?Cardiologist - n/a ?Neurology - Metta Clines, DO ? ?Chest x-ray - 10/12/20 (2V) ?EKG - 10/12/20 ?Stress Test - n/a ?ECHO - n/a ?Cardiac Cath - n/a ? ?ICD Pacemaker/Loop - n/a ? ?Sleep Study -  n/a ?CPAP - none ? ?Do not take Ozempic or phentermine on the morning of surgery. ? ?If your blood sugar is less than 70 mg/dL, you will need to treat for low blood sugar: ?Treat a low blood sugar (less than 70 mg/dL) with ? cup of clear juice (cranberry or apple), 4 glucose tablets, OR glucose gel. ?Recheck blood sugar in 15 minutes after treatment (to make sure it is greater than 70 mg/dL). If your blood sugar is not greater than 70 mg/dL on recheck, call [phone] for further instructions. ? ?Blood Thinner Instructions:  Continue Plavix prior to procedure per MD. ? ?Aspirin Instructions: Continue ASA prior to procedure per MD.   ? ?Anesthesia review: Yes ? ?STOP now taking any Aleve, Naproxen, Ibuprofen, Motrin, Advil, Goody's, BC's, all herbal medications, fish oil, and all vitamins.  ? ?Coronavirus Screening ?Covid test is scheduled on DOS ?Do you have any of the following symptoms:  ?Cough yes/no: No ?Fever (>100.58F)  yes/no: No ?Runny nose yes/no: No ?Sore throat yes/no: No ?Difficulty breathing/shortness of breath  yes/no: No ? ?Have you traveled in the last 14 days and where? yes/no: No ? ?Patient verbalized understanding of instructions that were given via phone. ?

## 2021-11-18 NOTE — Anesthesia Preprocedure Evaluation (Addendum)
Anesthesia Evaluation  ?Patient identified by MRN, date of birth, ID band ?Patient awake ? ? ? ?Reviewed: ?Allergy & Precautions, NPO status , Patient's Chart, lab work & pertinent test results, reviewed documented beta blocker date and time  ? ?Airway ?Mallampati: II ? ?TM Distance: >3 FB ?Neck ROM: Full ? ? ? Dental ? ?(+) Teeth Intact, Dental Advisory Given,  ?  ?Pulmonary ?former smoker,  ?  ?Pulmonary exam normal ?breath sounds clear to auscultation ? ? ? ? ? ? Cardiovascular ?hypertension, Pt. on medications ?Normal cardiovascular exam ?Rhythm:Regular Rate:Normal ? ?EKG 10/14/21 ?NSR, prolonged PR interval ?  ?Neuro/Psych ? Headaches, PSYCHIATRIC DISORDERS Anxiety Depression ADHDUnrelenting tinnitus thought due to bilatera; sigmoid sinus stenosis S/P left sigmoid sinus stenting, currently scheduled for right sigmoid sinus stenting ? Neuromuscular disease   ? GI/Hepatic ?Neg liver ROS, GERD  Medicated,  ?Endo/Other  ?diabetes, Poorly Controlled, Type 2, Insulin DependentObesity ? Renal/GU ?negative Renal ROS  ?negative genitourinary ?  ?Musculoskeletal ? ?(+) Arthritis , Osteoarthritis,   ? Abdominal ?(+) + obese,   ?Peds ? Hematology ? ?(+) Blood dyscrasia, anemia , Plavix therapy   ?Anesthesia Other Findings ? ? Reproductive/Obstetrics ? ?  ? ? ? ? ? ? ? ? ? ? ? ? ? ?  ?  ? ? ? ? ? ? ?Anesthesia Physical ?Anesthesia Plan ? ?ASA: 3 ? ?Anesthesia Plan: General  ? ?Post-op Pain Management: Precedex and Tylenol PO (pre-op)*  ? ?Induction:  ? ?PONV Risk Score and Plan: 4 or greater and Treatment may vary due to age or medical condition, Ondansetron and Dexamethasone ? ?Airway Management Planned: Oral ETT ? ?Additional Equipment: Arterial line ? ?Intra-op Plan:  ? ?Post-operative Plan: Extubation in OR ? ?Informed Consent: I have reviewed the patients History and Physical, chart, labs and discussed the procedure including the risks, benefits and alternatives for the proposed  anesthesia with the patient or authorized representative who has indicated his/her understanding and acceptance.  ? ? ? ?Dental advisory given ? ?Plan Discussed with: CRNA and Anesthesiologist ? ?Anesthesia Plan Comments: (See APP note by Durel Salts, FNP )  ? ? ? ? ?Anesthesia Quick Evaluation ? ?

## 2021-11-18 NOTE — Progress Notes (Signed)
Anesthesia Chart Review: ? ?Pt is a same day work up ? ? Case: [id number] Date/Time: 11/21/21 0815  ? Procedure: IR WITH ANESTHESIA STENTING  ? Anesthesia type: General  ? Pre-op diagnosis: STENOSIS  ? Location: [facility] OR RADIOLOGY ROOM / [place] [facility]  ? Surgeons: Luanne Bras, MD  ? ?  ? ? ?DISCUSSION: ?History includes former smoker, anemia, DM2, edema, HTN, ADHD, migraines, hysterectomy (08/30/10). Neurology notes indicate that she was diagnosed with idiopathic intracranial hypertension in 2019 (empty sella on imaging) that was treated with Diamox.  Rheumatology notes indicate history of Raynaud's phenomenon---she had "negative" autoimmune labs drawn on 01/19/21. ?  ?She reported episode of right facial weakness and numbness to neurologist Dr. Tomi Likens on 04/28/21 which lead to a brain MRI on 05/19/21 showed subtle enlargement (~ 8 mm size) of a left superior cerebellum lesion since 2017. S/p cerebral arteriogram 05/31/21 which showed: ?1.approx 3.4 mm x 4.3 mm nidus of a fast flow AVM arising fronm the Lt PCA P2-P3 seg ,associated with a 11.47m x 4.755mlobulated venous dilatation. ?2.high grade Lt SS and  mod RT SS stenosis. ?  ?IR felt severe stenosis in the proximal left sigmoid sinus and probably the right sigmoid sinus proximally was likely responsible for her severe debilitating pulsatile tinnitus. Endovascular revascularization of the stenosed dural sigmoid sinuses was discussed, one side at a time. L side stented 06/2021; current procedure is to stent the R.  ? ?Pt to continue plavix and ASA perioperatively ? ? ?PROVIDERS: ?MoMinette BrineFNP is PCP  ?DeBo MerinoMD is rheumatologist ?JaMetta ClinesDO is neurologist ? ? ?LABS: Will be obtained day of surgery  ?P2Y12 11/14/21: 41 ? ? ?IMAGES: ?MR MRV head 09/26/21:  ?- Interval distal left transverse sinus and proximal sigmoid sinus stent placement. Stent is presumed to be patent given enhancement beyond. ?- Marked distal right transverse sinus stenosis. ?- Known left  cerebellar AVM is incidentally identified. Compared to the prior study, there is decreased size of a focal area of venous ectasia or aneurysm formation. ? ? ?EKG: Will be obtained day of surgery  ? ? ?CV: N/A ? ?Past Medical History:  ?Diagnosis Date  ? ADHD (attention deficit hyperactivity disorder)   ? Anemia   ? iron deficiency   as a teenager  ? Anxiety   ? Arthritis   ? Diabetes ([place]  ? Edema   ? GERD (gastroesophageal reflux disease)   ? Hypertension   ? patient denies- patient stated that the PCP said it was for heart prevention.  ? Migraine headache   ? ? ?Past Surgical History:  ?Procedure Laterality Date  ? ABDOMINAL HYSTERECTOMY    ? CHOLECYSTECTOMY    ? COLONOSCOPY  12/28/2020  ? polyps  ? ESOPHAGEAL DILATION    ? IR ANGIO INTRA EXTRACRAN SEL COM CAROTID INNOMINATE BILAT MOD SED  05/31/2021  ? IR ANGIO INTRA EXTRACRAN SEL COM CAROTID INNOMINATE BILAT MOD SED  06/27/2021  ? IR ANGIO VERTEBRAL SEL VERTEBRAL UNI R MOD SED  05/31/2021  ? IR CT HEAD LTD  06/27/2021  ? IR INTRA CRAN STENT  06/27/2021  ? IR RADIOLOGIST EVAL & MGMT  06/03/2021  ? IR RADIOLOGIST EVAL & MGMT  07/13/2021  ? IR RADIOLOGIST EVAL & MGMT  08/27/2021  ? IR RADIOLOGIST EVAL & MGMT  10/30/2021  ? IR [place] VASC ACCESS RIGHT  05/31/2021  ? IR [place] VASC ACCESS RIGHT  06/27/2021  ? OVARIAN CYST REMOVAL    ? RADIOLOGY  WITH ANESTHESIA N/A 06/27/2021  ? Procedure: STENTING;  Surgeon: Luanne Bras, MD;  Location: [place];  Service: Radiology;  Laterality: N/A;  ? ? ?MEDICATIONS: ?No current facility-administered medications for this encounter.  ? ? acetaZOLAMIDE (DIAMOX) 250 MG tablet  ? ADDERALL XR 30 MG 24 hr capsule  ? aspirin EC 81 MG tablet  ? atorvastatin (LIPITOR) 10 MG tablet  ? buPROPion (WELLBUTRIN XL) 150 MG 24 hr tablet  ? cetirizine (ZYRTEC) 10 MG tablet  ? chlorhexidine (PERIDEX) 0.12 % solution  ? clonazePAM (KLONOPIN) 0.5 MG tablet  ? clopidogrel (PLAVIX) 75 MG tablet  ? DULoxetine (CYMBALTA) 60 MG capsule  ?  Erenumab-aooe (AIMOVIG) 140 MG/ML SOAJ  ? famotidine (PEPCID) 20 MG tablet  ? hydrOXYzine (VISTARIL) 50 MG capsule  ? Lasmiditan Succinate (REYVOW) 100 MG TABS  ? linaclotide (LINZESS) 72 MCG capsule  ? lisinopril-hydrochlorothiazide (ZESTORETIC) 10-12.5 MG tablet  ? omeprazole (PRILOSEC) 40 MG capsule  ? ondansetron (ZOFRAN-ODT) 4 MG disintegrating tablet  ? OZEMPIC, 2 MG/DOSE, 8 MG/3ML SOPN  ? phentermine 15 MG capsule  ? Polyethyl Glycol-Propyl Glycol (SYSTANE) 0.4-0.3 % SOLN  ? saccharomyces boulardii (FLORASTOR) 250 MG capsule  ? tiZANidine (ZANAFLEX) 4 MG tablet  ? topiramate (TOPAMAX) 50 MG tablet  ? Vitamin D, Ergocalciferol, (DRISDOL) 1.25 MG (50000 UNIT) CAPS capsule  ? Blood Glucose Monitoring Suppl (ONETOUCH VERIO IQ SYSTEM) w/Device KIT  ? glucose blood test strip  ? Insulin Pen Needle (PEN NEEDLES) 32G X 4 MM MISC  ? promethazine-dextromethorphan (PROMETHAZINE-DM) 6.25-15 MG/5ML syrup  ? tirzepatide (MOUNJARO) 2.5 MG/0.5ML Pen  ? ? ?If labs and EKG acceptable day of surgery, I anticipate pt can proceed with surgery as scheduled. ? ?Willeen Cass, PhD, FNP-BC ?[facility]/Anesthesiology ?Phone: [phone] ?11/18/2021 12:06 PM  ? ? ? ? ? ?

## 2021-11-21 ENCOUNTER — Observation Stay (HOSPITAL_COMMUNITY)
Admission: RE | Admit: 2021-11-21 | Discharge: 2021-11-22 | Disposition: A | Discharge status: Home / Self Care | Payer: No Typology Code available for payment source | Attending: Interventional Radiology | Admitting: Interventional Radiology

## 2021-11-21 ENCOUNTER — Encounter (HOSPITAL_COMMUNITY)
Admission: RE | Disposition: A | Discharge status: Home / Self Care | Payer: Self-pay | Source: Home / Self Care | Attending: Interventional Radiology

## 2021-11-21 ENCOUNTER — Encounter (HOSPITAL_COMMUNITY): Payer: Self-pay | Admitting: Interventional Radiology

## 2021-11-21 ENCOUNTER — Ambulatory Visit (HOSPITAL_BASED_OUTPATIENT_CLINIC_OR_DEPARTMENT_OTHER): Payer: No Typology Code available for payment source | Admitting: Emergency Medicine

## 2021-11-21 ENCOUNTER — Ambulatory Visit (HOSPITAL_COMMUNITY): Payer: No Typology Code available for payment source | Admitting: Emergency Medicine

## 2021-11-21 ENCOUNTER — Encounter (HOSPITAL_COMMUNITY): Payer: Self-pay

## 2021-11-21 ENCOUNTER — Other Ambulatory Visit: Payer: Self-pay

## 2021-11-21 ENCOUNTER — Observation Stay (HOSPITAL_COMMUNITY)
Admission: RE | Admit: 2021-11-21 | Discharge: 2021-11-21 | Disposition: A | Discharge status: Home / Self Care | Payer: No Typology Code available for payment source | Source: Ambulatory Visit | Attending: Interventional Radiology | Admitting: Interventional Radiology

## 2021-11-21 DIAGNOSIS — H93A1 Pulsatile tinnitus, right ear: Secondary | ICD-10-CM | POA: Insufficient documentation

## 2021-11-21 DIAGNOSIS — G08 Intracranial and intraspinal phlebitis and thrombophlebitis: Secondary | ICD-10-CM | POA: Diagnosis present

## 2021-11-21 DIAGNOSIS — Z95828 Presence of other vascular implants and grafts: Secondary | ICD-10-CM | POA: Diagnosis not present

## 2021-11-21 DIAGNOSIS — Z79899 Other long term (current) drug therapy: Secondary | ICD-10-CM | POA: Insufficient documentation

## 2021-11-21 DIAGNOSIS — Z7902 Long term (current) use of antithrombotics/antiplatelets: Secondary | ICD-10-CM | POA: Insufficient documentation

## 2021-11-21 DIAGNOSIS — Z7984 Long term (current) use of oral hypoglycemic drugs: Secondary | ICD-10-CM | POA: Insufficient documentation

## 2021-11-21 DIAGNOSIS — Z794 Long term (current) use of insulin: Secondary | ICD-10-CM

## 2021-11-21 DIAGNOSIS — E119 Type 2 diabetes mellitus without complications: Secondary | ICD-10-CM | POA: Diagnosis not present

## 2021-11-21 DIAGNOSIS — Z7985 Long-term (current) use of injectable non-insulin antidiabetic drugs: Secondary | ICD-10-CM | POA: Diagnosis not present

## 2021-11-21 DIAGNOSIS — I771 Stricture of artery: Secondary | ICD-10-CM

## 2021-11-21 DIAGNOSIS — G9389 Other specified disorders of brain: Secondary | ICD-10-CM | POA: Diagnosis not present

## 2021-11-21 DIAGNOSIS — Z87891 Personal history of nicotine dependence: Secondary | ICD-10-CM | POA: Diagnosis not present

## 2021-11-21 DIAGNOSIS — I10 Essential (primary) hypertension: Secondary | ICD-10-CM | POA: Diagnosis not present

## 2021-11-21 DIAGNOSIS — E1165 Type 2 diabetes mellitus with hyperglycemia: Secondary | ICD-10-CM

## 2021-11-21 DIAGNOSIS — Z7982 Long term (current) use of aspirin: Secondary | ICD-10-CM | POA: Diagnosis not present

## 2021-11-21 HISTORY — PX: IR ANGIO INTRA EXTRACRAN SEL COM CAROTID INNOMINATE UNI R MOD SED: IMG5359

## 2021-11-21 HISTORY — PX: IR CT HEAD LTD: IMG2386

## 2021-11-21 HISTORY — PX: IR INTRA CRAN STENT: IMG2345

## 2021-11-21 HISTORY — PX: RADIOLOGY WITH ANESTHESIA: SHX6223

## 2021-11-21 HISTORY — PX: IR US GUIDE VASC ACCESS RIGHT: IMG2390

## 2021-11-21 LAB — PROTIME-INR
INR: 1.1 (ref 0.8–1.2)
Prothrombin Time: 13.7 seconds (ref 11.4–15.2)

## 2021-11-21 LAB — BASIC METABOLIC PANEL
Anion gap: 9 (ref 5–15)
BUN: 6 mg/dL (ref 6–20)
CO2: 19 mmol/L — ABNORMAL LOW (ref 22–32)
Calcium: 9 mg/dL (ref 8.9–10.3)
Chloride: 109 mmol/L (ref 98–111)
Creatinine, Ser: 0.71 mg/dL (ref 0.44–1.00)
GFR, Estimated: >60 mL/min (ref >60)
Glucose, Bld: 144 mg/dL — ABNORMAL HIGH (ref 70–99)
Potassium: 3.2 mmol/L — ABNORMAL LOW (ref 3.5–5.1)
Sodium: 137 mmol/L (ref 135–145)

## 2021-11-21 LAB — CBC WITH DIFFERENTIAL/PLATELET
Abs Immature Granulocytes: 0.04 10*3/uL (ref 0.00–0.07)
Basophils Absolute: 0.1 10*3/uL (ref 0.0–0.1)
Basophils Relative: 1 %
Eosinophils Absolute: 0.2 10*3/uL (ref 0.0–0.5)
Eosinophils Relative: 3 %
HCT: 38.4 % (ref 36.0–46.0)
Hemoglobin: 12.8 g/dL (ref 12.0–15.0)
Immature Granulocytes: 0 %
Lymphocytes Relative: 27 %
Lymphs Abs: 2.5 10*3/uL (ref 0.7–4.0)
MCH: 28.6 pg (ref 26.0–34.0)
MCHC: 33.3 g/dL (ref 30.0–36.0)
MCV: 85.7 fL (ref 80.0–100.0)
Monocytes Absolute: 0.7 10*3/uL (ref 0.1–1.0)
Monocytes Relative: 8 %
Neutro Abs: 5.5 10*3/uL (ref 1.7–7.7)
Neutrophils Relative %: 61 %
Platelets: 327 10*3/uL (ref 150–400)
RBC: 4.48 MIL/uL (ref 3.87–5.11)
RDW: 15.5 % (ref 11.5–15.5)
WBC: 9 10*3/uL (ref 4.0–10.5)
nRBC: 0 % (ref 0.0–0.2)

## 2021-11-21 LAB — MRSA NEXT GEN BY PCR, NASAL: MRSA by PCR Next Gen: NOT DETECTED

## 2021-11-21 LAB — GLUCOSE, CAPILLARY
Glucose-Capillary: 155 mg/dL — ABNORMAL HIGH (ref 70–99)
Glucose-Capillary: 88 mg/dL (ref 70–99)
Glucose-Capillary: 145 mg/dL — ABNORMAL HIGH (ref 70–99)
Glucose-Capillary: 165 mg/dL — ABNORMAL HIGH (ref 70–99)
Glucose-Capillary: 138 mg/dL — ABNORMAL HIGH (ref 70–99)

## 2021-11-21 LAB — HEMOGLOBIN A1C
Hgb A1c MFr Bld: 6.9 % — ABNORMAL HIGH (ref 4.8–5.6)
Mean Plasma Glucose: 151.33 mg/dL

## 2021-11-21 LAB — HEPARIN LEVEL (UNFRACTIONATED): Heparin Unfractionated: <0.1 IU/mL — ABNORMAL LOW (ref 0.30–0.70)

## 2021-11-21 LAB — POCT ACTIVATED CLOTTING TIME: Activated Clotting Time: 215 seconds

## 2021-11-21 LAB — SARS CORONAVIRUS 2 (TAT 6-24 HRS): SARS Coronavirus 2: NEGATIVE

## 2021-11-21 IMAGING — XA IR INTRACRANIAL STENT (INCL PTA)
10 of 16 series · 10 of 24 positions shown · IV contrast (IODINE)
Comparison: Angiogram [DATE], and MRV [DATE].

CLINICAL DATA: History of bilateral transverse sinus/sigmoid sinus
stenosis with bilateral pulsatile tinnitus.

Previously treated left transverse sinus sigmoid sinus stenosis with
stenting. Patient presents with persistent debilitating right-sided
pulsatile tinnitus associated with intermittent headaches.
EXAM:
INTRACRANIAL STENT (INCL PTA)
TECHNIQUE: Informed written consent was obtained from the patient after a
thorough discussion of the procedural risks, benefits and
alternatives. All questions were addressed. Maximal Sterile Barrier
Technique was utilized including caps, mask, sterile gowns, sterile
gloves, sterile drape, hand hygiene and skin antiseptic. A timeout
was performed prior to the initiation of the procedure.

[Series 2: cerebral · 1 of 10 frames shown (1 of 9)]
[frame 6/10]
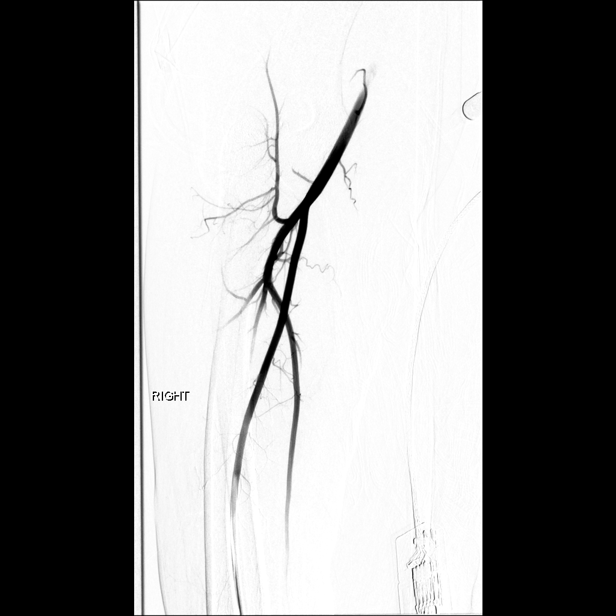

[Series 3: cerebral · 2 acquisitions, 1 frame shown (2 of 9)]
[im 1/2]
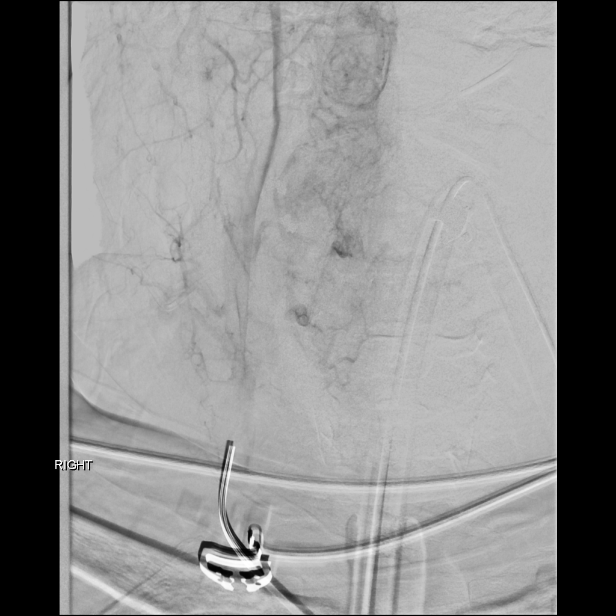

[Series 5: cerebral · 2 acquisitions, 1 frame shown (3 of 9)]
[im 1/2]
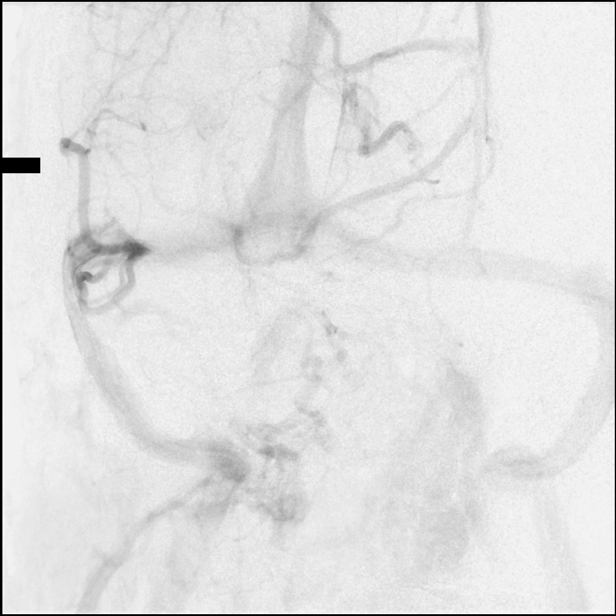

[Series 7: cerebral · 2 acquisitions, 1 frame shown (4 of 9)]
[im 1/2]
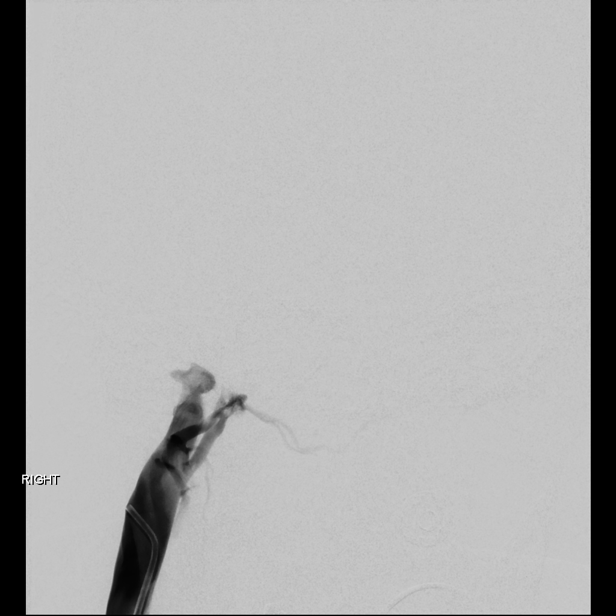

[Series 9: cerebral · 2 acquisitions, 1 frame shown (5 of 9)]
[im 1/2]
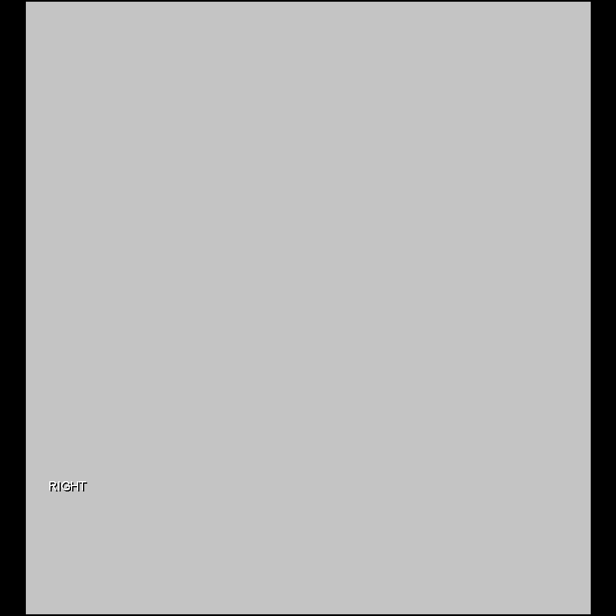

[Series 10: cerebral · 2 acquisitions, 1 frame shown (6 of 9)]
[im 1/2]
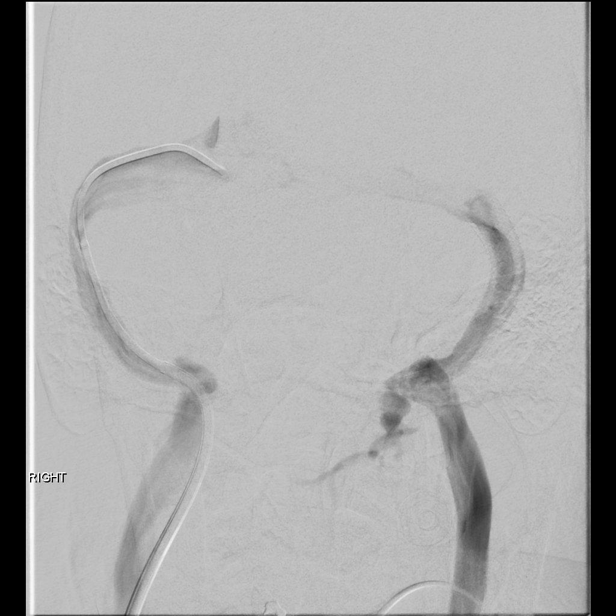

[Series 12: cerebral · 2 acquisitions, 1 frame shown (7 of 9)]
[im 1/2]
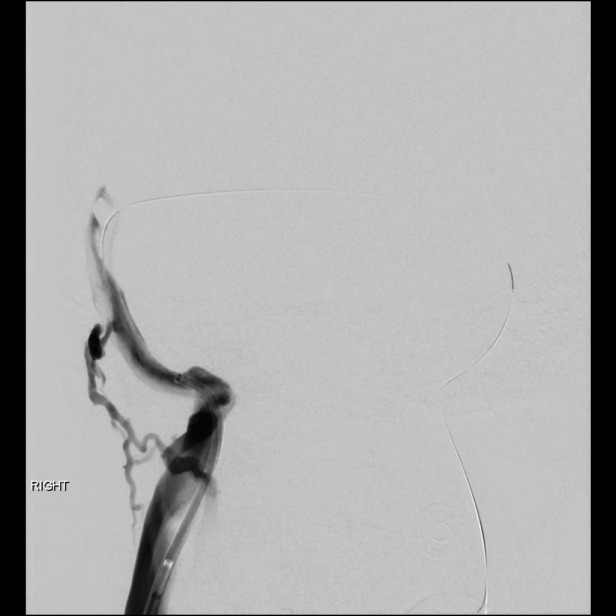

[Series 14: cerebral · 2 acquisitions, 1 frame shown (8 of 9)]
[im 1/2]
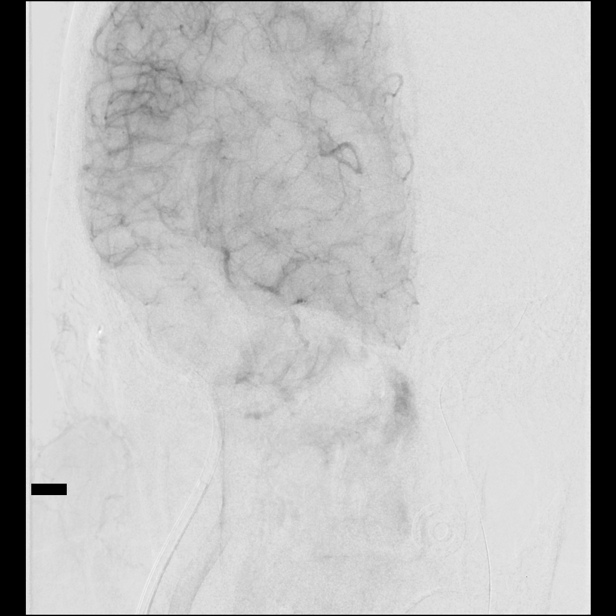

[Series 15: cerebral · 2 acquisitions, 1 frame shown (9 of 9)]
[im 1/2]
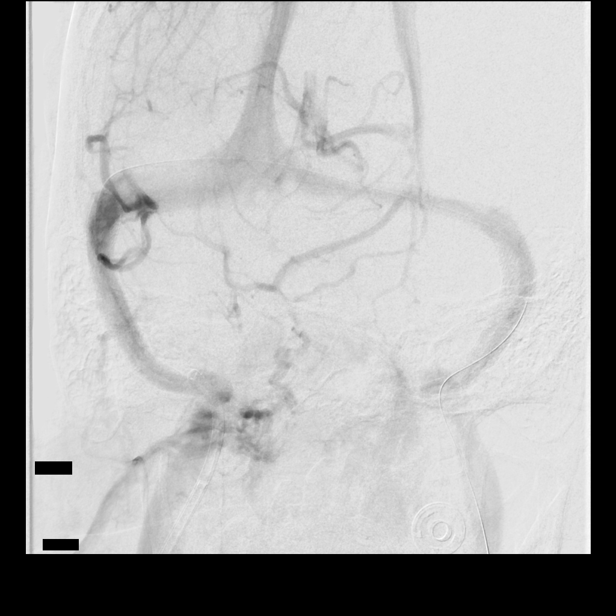

[Series 300: ir intra cran stent · 1 of 313 slices shown]
[im 114/313]
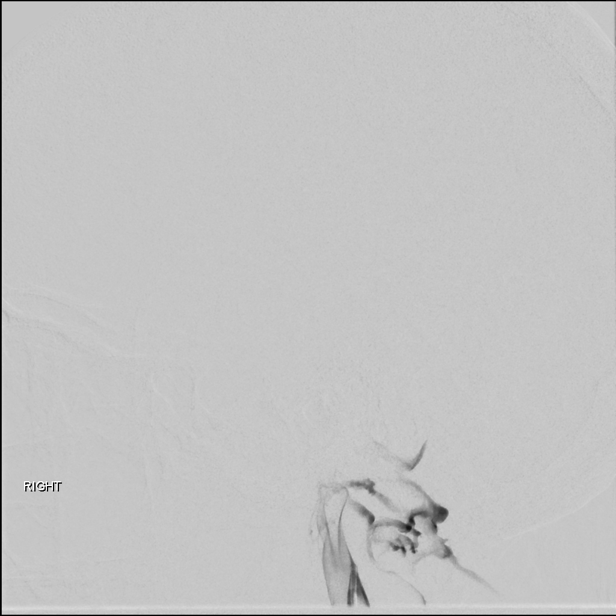

[10 of 24 positions shown; findings below may reference images not displayed]

MEDICATIONS:
Heparin 3,000 units IV; Ancef 2 g IV antibiotic was administered
within 1 hour of the procedure.

ANESTHESIA/SEDATION:
General anesthesia.

CONTRAST:  Omnipaque 300 approximately 100 mL.

FLUOROSCOPY TIME:  Fluoroscopy Time: 35 minutes 18 seconds ([TQ]
mGy).

COMPLICATIONS:
None immediate.
The right forearm was prepped and draped in the usual sterile
manner. The right radial artery was then identified with ultrasound,
and its morphology documented permanently in the radiology PACS
system.

A dorsal palmar anastomosis was verified to be present. Using
ultrasound guidance, and micropuncture set, access into the right
radial artery was obtained over a 0.018 inch micro guidewire.

A 5 French radial sheath was then inserted without event.

The micro guidewire, and the obturator were removed. Good aspiration
was obtained from the side port of the radial sheath. A cocktail of
[TQ] units of heparin, 2.5 mg of verapamil, and 200 mcg of
nitroglycerin was then infused in diluted form through the sheath
without event. A right radial arteriogram was performed.

Over a 0.035 inch Roadrunner guidewire, TLEK 2 diagnostic
catheter was advanced to the aortic arch region and eventually
positioned in the right common carotid artery just proximal to the
origin of the bifurcation.

An arteriogram was then performed through this.

The right groin was prepped and draped in the usual sterile fashion.
Thereafter using modified Seldinger technique, transfemoral access
into the right common femoral vein was obtained without difficulty.
Over a 0.035 inch guidewire, a 5 French Pinnacle sheath was
inserted. Through this, and also over 0.035 inch guidewire, a 5
French JB 1 catheter was advanced into the right inferior vena cava.
This was then connected to continuous heparinized saline infusion.
Over a 0.035 inch J guidewire, a 5 French JB 1 catheter was then
advanced under fluoroscopic guidance into the inferior cava,
superior vena cava and eventually into the right internal jugular
vein at the skull base. The guidewire was removed. Good aspiration
was obtained from the 5 French diagnostic catheter. A gentle control
arteriogram performed through this demonstrates safe positioning of
the tip of the diagnostic catheter. This in turn was then exchanged
over an 035 inch, 300 exchange guidewire for an 85 cm 8 French
Neuron Max sheath which was advanced without difficulty to the right
internal jugular vein at the skull base. Guidewire was removed.
After good aspiration was obtained, this was then connected to
continuous heparinized saline infusion.
FINDINGS: The right common carotid arteriogram demonstrates the right external
carotid artery and its major branches to be widely patent.

The right internal carotid artery at the bulb to the cranial skull
base is widely patent.

The petrous, the cavernous and the supraclinoid segments demonstrate
wide patency.

The right middle cerebral artery and the right anterior cerebral
artery opacify into the capillary and venous phases. The venous
phase demonstrates previously treated left transverse sinus sigmoid
sinus to be widely patent.

Demonstrated in the right transverse sinus sigmoid sinus junction a
focal area of significant narrowing associated with an irregular
filling defect extending from the medial aspect of proximal sigmoid
sinus to just above the mid sigmoid sinus. This is also demonstrated
on the oblique lateral DSA venous phase.

ENDOVASCULAR TREATMENT OF THE RIGHT TRANSVERSE SINUS SIGMOID SINUS
STENOSIS WITH STENT PLACEMENT.

A 95 cm 071 Benchmark catheter was then advanced over a 0.035 inch
Roadrunner guidewire to the distal end of the Neuron Max sheath in
the right internal jugular vein.

The Benchmark catheter with the support catheter was advanced
without difficulty over the 035 inch Roadrunner guidewire to the
proximal right transverse sinus.

The support catheter and the guidewire were removed. Good aspiration
obtained from the hub of the Benchmark catheter. Venogram was then
performed from the superior sagittal sinus into the right transverse
sinus and right sigmoid sinus. This again demonstrates the stenosis
at the right transverse sinus sigmoid sinus junction.

An 014 inch, 300 cm the Zoom exchange wire was then advanced into
the left transverse sinus. Measurements were performed of the right
transverse sinus, and the right sigmoid sinus.

It was elected to proceed with placement of a 8 mm x 40 mm Precise
stent.

Stent delivery apparatus was then prepped and purged with
heparinized saline infusion.

Using the rapid exchange technique, this was then advanced to the
right transverse sinus at its junction with the superior sagittal
sinus. The Neuron Max sheath and the Benchmark support catheter was
then retrieved proximally to the proximal right sigmoid sinus.

Thereafter, the stent was then deployed in the usual manner under
fluoroscopic guidance.

Venogram was then performed through the Benchmark guide catheter in
the mid sigmoid sinus demonstrating excellent placement and
apposition of the stent with now significantly improved caliber of
the right sigmoid sinus at the site of the stenosis.

10 minute, and 25 minute post stent deployment arteriograms into the
venous phase were obtained through the right common carotid artery
injection.

These continued to demonstrate excellent flow through the right
transverse sinus sigmoid sinus stented segment and into the right
internal jugular vein.

No evidence of intraluminal filling defects or of occlusion was
evident.

The exchange micro guidewire was removed. Combination of the
Benchmark support catheter, and the 8 French Neuron Max sheath was
retrieved and removed.

Manual pressure was held at the right common femoral venous puncture
site with quick clot.

The 5 French diagnostic catheter from the right common carotid
artery was removed. A wrist band was then applied for hemostasis at
the right wrist puncture site. Distal right radial pulse was
verified to be present.

A CT of the brain demonstrated no evidence of intracranial
hemorrhage, or of hydrocephalus.

Patient's general anesthesia was reversed, and patient was extubated
without event.

Upon recovery, the patient denied any headaches, nausea or vomiting.
Patient moved all four extremities equally. She was then transferred
to PACU and then neuro ICU for overnight neurologic observations, on
IV heparin low-dose, and blood pressure management.

She did complain of a [DATE] headache generalized and throbbing in
nature very much like the migraine headaches that the patient has a
history of. She denied any nausea or vomiting.

This was treated with IV Toradol successfully. Patient able to
tolerate clear liquids until the following morning.

The following day, the IV heparin was stopped and patient was
switched to aspirin 81 mg a day, and Plavix 75 mg a day.

Right groin appeared soft. Radial access site appeared without any
hematoma with good radial pulse.

Neurologically, the patient was nonfocal. She was alert, awake,
oriented.

She did not have any headaches. She claimed not being aware of a
right-sided pulsatile tinnitus.

She was then discharged home under the care of her family.

The patient was advised to maintain adequate hydration and to resume
her home medications as prescribed.

She was asked to avoid driving for a couple of weeks and to avoid
stooping, bending or lifting weights above 10 pounds for 2 weeks.
She ambulated independently, and was able to tolerate solid food
prior to discharge.

Patient expressed understanding and agreement with the above
management plan.
IMPRESSION: Status post endovascular treatment of symptomatic right transverse
sinus sigmoid sinus stenosis with placement of an 8 mm x 40 mm
Precise stent.

PLAN:
Follow-up in clinic 2 weeks post discharge to address patient's
known arteriovenous malformation in the left posterior cerebral
artery P3 region.

## 2021-11-21 SURGERY — IR WITH ANESTHESIA
Anesthesia: General

## 2021-11-21 MED ORDER — ACETAMINOPHEN 160 MG/5ML PO SOLN: 650.0000 mg | ORAL | Status: DC | PRN

## 2021-11-21 MED ORDER — ROCURONIUM BROMIDE 10 MG/ML (PF) SYRINGE
PREFILLED_SYRINGE | INTRAVENOUS | Status: DC | PRN
  Administered 2021-11-21: 90 mg via INTRAVENOUS
  Administered 2021-11-21: 20 mg via INTRAVENOUS
  Administered 2021-11-21: 10 mg via INTRAVENOUS

## 2021-11-21 MED ORDER — ACETAZOLAMIDE 250 MG PO TABS
250.0000 mg | ORAL_TABLET | Freq: Two times a day (BID) | ORAL | Status: DC
  Administered 2021-11-21 – 2021-11-22 (×2): 250 mg via ORAL
  Filled 2021-11-21 (×2): qty 1

## 2021-11-21 MED ORDER — IOHEXOL 300 MG/ML  SOLN
100.0000 mL | Freq: Once | INTRAMUSCULAR | Status: AC | PRN
  Administered 2021-11-21: 60 mL via INTRAVENOUS

## 2021-11-21 MED ORDER — HEPARIN SODIUM (PORCINE) 1000 UNIT/ML IJ SOLN
INTRAMUSCULAR | Status: DC | PRN
  Administered 2021-11-21: 1000 [IU] via INTRAVENOUS

## 2021-11-21 MED ORDER — ACETAMINOPHEN 325 MG PO TABS
650.0000 mg | ORAL_TABLET | ORAL | Status: DC | PRN
  Administered 2021-11-21 (×2): 650 mg via ORAL
  Filled 2021-11-21 (×2): qty 2

## 2021-11-21 MED ORDER — LIDOCAINE HCL (PF) 1 % IJ SOLN
INTRAMUSCULAR | Status: DC | PRN
  Administered 2021-11-21: 7 mL via INTRA_ARTERIAL

## 2021-11-21 MED ORDER — ASPIRIN EC 325 MG PO TBEC: 325.0000 mg | DELAYED_RELEASE_TABLET | ORAL | Status: DC

## 2021-11-21 MED ORDER — LIDOCAINE HCL 1 % IJ SOLN
INTRAMUSCULAR | Status: AC
  Administered 2021-11-21: 2 mL
  Filled 2021-11-21: qty 20

## 2021-11-21 MED ORDER — ACETAMINOPHEN 325 MG PO TABS
ORAL_TABLET | ORAL | Status: AC
  Filled 2021-11-21: qty 2

## 2021-11-21 MED ORDER — CLOPIDOGREL BISULFATE 75 MG PO TABS: 75.0000 mg | ORAL_TABLET | Freq: Every day | ORAL | Status: DC

## 2021-11-21 MED ORDER — SODIUM CHLORIDE 0.9 % IV SOLN: INTRAVENOUS | Status: DC

## 2021-11-21 MED ORDER — DEXAMETHASONE SODIUM PHOSPHATE 10 MG/ML IJ SOLN
INTRAMUSCULAR | Status: DC | PRN
  Administered 2021-11-21: 5 mg via INTRAVENOUS

## 2021-11-21 MED ORDER — SODIUM CHLORIDE (PF) 0.9 % IJ SOLN
INTRAMUSCULAR | Status: DC | PRN
  Administered 2021-11-21: 200 ug via INTRA_ARTERIAL

## 2021-11-21 MED ORDER — TOPIRAMATE 100 MG PO TABS
100.0000 mg | ORAL_TABLET | Freq: Every day | ORAL | Status: DC
  Administered 2021-11-21: 100 mg via ORAL
  Filled 2021-11-21: qty 1

## 2021-11-21 MED ORDER — ASPIRIN 81 MG PO CHEW: 81.0000 mg | CHEWABLE_TABLET | Freq: Every day | ORAL | Status: DC

## 2021-11-21 MED ORDER — SODIUM CHLORIDE 0.9 % IV SOLN
INTRAVENOUS | Status: DC
Start: 2021-11-21 — End: 2021-11-22

## 2021-11-21 MED ORDER — CLEVIDIPINE BUTYRATE 0.5 MG/ML IV EMUL
INTRAVENOUS | Status: AC
  Filled 2021-11-21: qty 50

## 2021-11-21 MED ORDER — HEPARIN (PORCINE) 25000 UT/250ML-% IV SOLN
500.0000 [IU]/h | INTRAVENOUS | Status: DC
Start: 2021-11-21 — End: 2021-11-21
  Administered 2021-11-21: 500 [IU]/h via INTRAVENOUS
  Filled 2021-11-21: qty 250

## 2021-11-21 MED ORDER — CHLORHEXIDINE GLUCONATE 0.12 % MT SOLN
15.0000 mL | Freq: Once | OROMUCOSAL | Status: AC
  Administered 2021-11-21: 15 mL via OROMUCOSAL

## 2021-11-21 MED ORDER — HEPARIN (PORCINE) 25000 UT/250ML-% IV SOLN
900.0000 [IU]/h | INTRAVENOUS | Status: AC
Start: 2021-11-21 — End: 2021-11-22

## 2021-11-21 MED ORDER — CHLORHEXIDINE GLUCONATE CLOTH 2 % EX PADS: 6.0000 | MEDICATED_PAD | Freq: Every day | CUTANEOUS | Status: DC

## 2021-11-21 MED ORDER — CLEVIDIPINE BUTYRATE 0.5 MG/ML IV EMUL
0.0000 mg/h | INTRAVENOUS | Status: DC
  Administered 2021-11-21: 4 mg/h via INTRAVENOUS
  Administered 2021-11-21: 8 mg/h via INTRAVENOUS
  Filled 2021-11-21: qty 50

## 2021-11-21 MED ORDER — PROPOFOL 10 MG/ML IV BOLUS
INTRAVENOUS | Status: DC | PRN
  Administered 2021-11-21: 150 mg via INTRAVENOUS
  Administered 2021-11-21: 50 mg via INTRAVENOUS

## 2021-11-21 MED ORDER — NITROGLYCERIN 1 MG/10 ML FOR IR/CATH LAB
INTRA_ARTERIAL | Status: AC
  Filled 2021-11-21: qty 10

## 2021-11-21 MED ORDER — ACETAMINOPHEN 650 MG RE SUPP: 650.0000 mg | RECTAL | Status: DC | PRN

## 2021-11-21 MED ORDER — INSULIN ASPART 100 UNIT/ML IJ SOLN
0.0000 [IU] | INTRAMUSCULAR | Status: DC | PRN
  Administered 2021-11-21: 2 [IU] via SUBCUTANEOUS
  Filled 2021-11-21: qty 1

## 2021-11-21 MED ORDER — NIMODIPINE 30 MG PO CAPS
0.0000 mg | ORAL_CAPSULE | ORAL | Status: AC
  Administered 2021-11-21: 30 mg via ORAL
  Filled 2021-11-21: qty 1

## 2021-11-21 MED ORDER — HEPARIN SODIUM (PORCINE) 1000 UNIT/ML IJ SOLN
INTRAMUSCULAR | Status: AC
  Filled 2021-11-21: qty 10

## 2021-11-21 MED ORDER — CLOPIDOGREL BISULFATE 75 MG PO TABS: 75.0000 mg | ORAL_TABLET | ORAL | Status: DC

## 2021-11-21 MED ORDER — SUGAMMADEX SODIUM 200 MG/2ML IV SOLN
INTRAVENOUS | Status: DC | PRN
  Administered 2021-11-21: 100 mg via INTRAVENOUS
  Administered 2021-11-21: 200 mg via INTRAVENOUS

## 2021-11-21 MED ORDER — ASPIRIN 81 MG PO CHEW
81.0000 mg | CHEWABLE_TABLET | Freq: Every day | ORAL | Status: DC
  Administered 2021-11-22: 81 mg via ORAL
  Filled 2021-11-21: qty 1

## 2021-11-21 MED ORDER — FENTANYL CITRATE (PF) 100 MCG/2ML IJ SOLN
INTRAMUSCULAR | Status: DC | PRN
  Administered 2021-11-21 (×2): 50 ug via INTRAVENOUS

## 2021-11-21 MED ORDER — ONDANSETRON HCL 4 MG/2ML IJ SOLN
INTRAMUSCULAR | Status: DC | PRN
  Administered 2021-11-21: 4 mg via INTRAVENOUS

## 2021-11-21 MED ORDER — CLOPIDOGREL BISULFATE 75 MG PO TABS
75.0000 mg | ORAL_TABLET | Freq: Every day | ORAL | Status: DC
  Administered 2021-11-22: 75 mg via ORAL
  Filled 2021-11-21: qty 1

## 2021-11-21 MED ORDER — VERAPAMIL HCL 2.5 MG/ML IV SOLN
INTRAVENOUS | Status: AC
  Filled 2021-11-21: qty 2

## 2021-11-21 MED ORDER — LIDOCAINE 2% (20 MG/ML) 5 ML SYRINGE
INTRAMUSCULAR | Status: DC | PRN
  Administered 2021-11-21: 80 mg via INTRAVENOUS

## 2021-11-21 MED ORDER — HEPARIN (PORCINE) 25000 UT/250ML-% IV SOLN
INTRAVENOUS | Status: AC
  Filled 2021-11-21: qty 250

## 2021-11-21 MED ORDER — AMISULPRIDE (ANTIEMETIC) 5 MG/2ML IV SOLN: 10.0000 mg | Freq: Once | INTRAVENOUS | Status: DC | PRN

## 2021-11-21 MED ORDER — HEPARIN (PORCINE) 25000 UT/250ML-% IV SOLN: 750.0000 [IU]/h | INTRAVENOUS | Status: DC

## 2021-11-21 MED ORDER — ONDANSETRON HCL 4 MG/2ML IJ SOLN: 4.0000 mg | Freq: Once | INTRAMUSCULAR | Status: DC | PRN

## 2021-11-21 MED ORDER — CLEVIDIPINE BUTYRATE 0.5 MG/ML IV EMUL: 0.0000 mg/h | INTRAVENOUS | Status: DC

## 2021-11-21 MED ORDER — HEPARIN (PORCINE) 25000 UT/250ML-% IV SOLN: 500.0000 [IU]/h | INTRAVENOUS | Status: DC

## 2021-11-21 MED ORDER — ACETAMINOPHEN 325 MG PO TABS: 650.0000 mg | ORAL_TABLET | ORAL | Status: DC | PRN

## 2021-11-21 MED ORDER — KETOROLAC TROMETHAMINE 30 MG/ML IJ SOLN
30.0000 mg | Freq: Four times a day (QID) | INTRAMUSCULAR | Status: AC
  Administered 2021-11-21 – 2021-11-22 (×4): 30 mg via INTRAMUSCULAR
  Filled 2021-11-21 (×4): qty 1

## 2021-11-21 MED ORDER — OXYCODONE HCL 5 MG/5ML PO SOLN: 5.0000 mg | Freq: Once | ORAL | Status: DC | PRN

## 2021-11-21 MED ORDER — HYDROXYZINE HCL 25 MG PO TABS
50.0000 mg | ORAL_TABLET | Freq: Every evening | ORAL | Status: DC | PRN
  Filled 2021-11-21: qty 1

## 2021-11-21 MED ORDER — OXYCODONE HCL 5 MG PO TABS: 5.0000 mg | ORAL_TABLET | Freq: Once | ORAL | Status: DC | PRN

## 2021-11-21 MED ORDER — FENTANYL CITRATE (PF) 100 MCG/2ML IJ SOLN
INTRAMUSCULAR | Status: AC
  Filled 2021-11-21: qty 2

## 2021-11-21 MED ORDER — CEFAZOLIN SODIUM-DEXTROSE 2-4 GM/100ML-% IV SOLN
2.0000 g | INTRAVENOUS | Status: AC
  Administered 2021-11-21: 2 g via INTRAVENOUS
  Filled 2021-11-21: qty 100

## 2021-11-21 MED ORDER — INSULIN ASPART 100 UNIT/ML IJ SOLN: 0.0000 [IU] | Freq: Three times a day (TID) | INTRAMUSCULAR | Status: DC

## 2021-11-21 MED ORDER — FENTANYL CITRATE (PF) 100 MCG/2ML IJ SOLN
25.0000 ug | INTRAMUSCULAR | Status: DC | PRN
  Administered 2021-11-21: 25 ug via INTRAVENOUS

## 2021-11-21 NOTE — Anesthesia Procedure Notes (Signed)
Arterial Line Insertion ?Start/End3/20/2023 8:10 AM, 11/21/2021 8:25 AM ?Performed by: Barrington Ellison, CRNA ? Patient location: Pre-op. ?Preanesthetic checklist: patient identified, IV checked and risks and benefits discussed ?Lidocaine 1% used for infiltration ?Left, radial was placed ?Catheter size: 20 G ?Hand hygiene performed  and maximum sterile barriers used  ?Allen's test indicative of satisfactory collateral circulation ?Attempts: 2 ?Procedure performed without using ultrasound guided technique. ?Following insertion, dressing applied and Biopatch. ?Post procedure assessment: normal ? ?Patient tolerated the procedure well with no immediate complications. ? ? ?

## 2021-11-21 NOTE — H&P (Signed)
? ?Chief Complaint: ?Patient was seen in consultation today for right transverse sinus/sigmoid sinus junction stenosis angioplasty/stent placement at the request of Dr Liborio Nixon ? ? ?Supervising Physician: Luanne Bras ? ?Patient Status: [facility] - Out-pt ? ?History of Present Illness: ?Virginia Douglas is a 46 y.o. female  ? ? ?Known to NIR ?Left sigmoid sinus angioplasty/stent 06/27/21 ?Pulsatile tinnitus has resolved in left ?Continues with Rt tinnitus and headaches ? ?MRV 09/26/21: ?IMPRESSION: ?Interval distal left transverse sinus and proximal sigmoid sinus ?stent placement. Stent is presumed to be patent given enhancement ?beyond. ?Marked distal right transverse sinus stenosis. ? Known left cerebellar AVM is incidentally identified. Compared to ?the prior study, there is decreased size of a focal area of venous ?ectasia or aneurysm formation. ? ?Was seen in consultation with Dr Estanislado Pandy 10/28/21 ?ASSESSMENT AND PLAN: ?Given the patient's debilitating symptoms as described, patient ?wishes to proceed with endovascular revascularization of the known ?right transverse sinus sigmoid sinus junction stenosis with ?angioplasty assisted stenting under general anesthesia. ?  ?Taking ASA/Plavix daily ?Scheduled for cerebral arteriogram with right transverse sinus/sigmoid sinus junction stenosis intervention today with Dr Estanislado Pandy ? ? ?Past Medical History:  ?Diagnosis Date  ? ADHD (attention deficit hyperactivity disorder)   ? Anemia   ? iron deficiency   as a teenager  ? Anxiety   ? Arthritis   ? Diabetes ([place])   ? Edema   ? GERD (gastroesophageal reflux disease)   ? Hypertension   ? patient denies- patient stated that the PCP said it was for heart prevention.  ? Migraine headache   ? ? ?Past Surgical History:  ?Procedure Laterality Date  ? ABDOMINAL HYSTERECTOMY    ? CHOLECYSTECTOMY    ? COLONOSCOPY  12/28/2020  ? polyps  ? ESOPHAGEAL DILATION    ? IR ANGIO INTRA EXTRACRAN SEL COM CAROTID INNOMINATE BILAT  MOD SED  05/31/2021  ? IR ANGIO INTRA EXTRACRAN SEL COM CAROTID INNOMINATE BILAT MOD SED  06/27/2021  ? IR ANGIO VERTEBRAL SEL VERTEBRAL UNI R MOD SED  05/31/2021  ? IR CT HEAD LTD  06/27/2021  ? IR INTRA CRAN STENT  06/27/2021  ? IR RADIOLOGIST EVAL & MGMT  06/03/2021  ? IR RADIOLOGIST EVAL & MGMT  07/13/2021  ? IR RADIOLOGIST EVAL & MGMT  08/27/2021  ? IR RADIOLOGIST EVAL & MGMT  10/30/2021  ? IR US GUIDE VASC ACCESS RIGHT  05/31/2021  ? IR US GUIDE VASC ACCESS RIGHT  06/27/2021  ? OVARIAN CYST REMOVAL    ? RADIOLOGY WITH ANESTHESIA N/A 06/27/2021  ? Procedure: STENTING;  Surgeon: Luanne Bras, MD;  Location: [facility];  Service: Radiology;  Laterality: N/A;  ? ? ?Allergies: ?Patient has no known allergies. ? ?Medications: ?Prior to Admission medications   ?Medication Sig Start Date End Date Taking? Authorizing Provider  ?acetaZOLAMIDE (DIAMOX) 250 MG tablet TAKE 1 TABLET BY MOUTH TWICE A DAY ?Patient taking differently: Take 500 mg by mouth daily. 03/23/21   Pieter Partridge, DO  ?ADDERALL XR 30 MG 24 hr capsule Take 30 mg by mouth daily. 09/29/20   [provider]  ?aspirin EC 81 MG tablet Take 81 mg by mouth daily. Swallow whole.    [provider]  ?atorvastatin (LIPITOR) 10 MG tablet TAKE 1 TABLET BY MOUTH EVERY DAY 11/03/21   [name], FNP  ?Blood Glucose Monitoring Suppl (ONETOUCH VERIO IQ SYSTEM) w/Device KIT by Does not apply route. Use to check blood sugars twice    [provider]  ?buPROPion (  WELLBUTRIN XL) 150 MG 24 hr tablet TAKE 1 TABLET BY MOUTH EVERY DAY IN THE MORNING 05/26/21   [name], FNP  ?cetirizine (ZYRTEC) 10 MG tablet Take 10 mg by mouth daily as needed for allergies.    [provider]  ?chlorhexidine (PERIDEX) 0.12 % solution 5 mLs by Mouth Rinse route daily. 11/09/21   [provider]  ?clonazePAM (KLONOPIN) 0.5 MG tablet TAKE 1 TABLET BY MOUTH THREE TIMES A DAY AS NEEDED FOR ANXIETY ?Patient taking differently: Take 0.5 mg by mouth 3  (three) times a week. 09/07/21   [name], FNP  ?clopidogrel (PLAVIX) 75 MG tablet Take 1 tablet (75 mg total) by mouth daily. 10/24/21   Candiss Norse A, PA-C  ?DULoxetine (CYMBALTA) 60 MG capsule TAKE 1 CAPSULE BY MOUTH EVERY DAY 09/02/21   Pieter Partridge, DO  ?Erenumab-aooe (AIMOVIG) 140 MG/ML SOAJ Inject 140 mg into the skin every 30 (thirty) days. 09/21/21   Pieter Partridge, DO  ?famotidine (PEPCID) 20 MG tablet Take 20 mg by mouth daily. 11/09/21   [provider]  ?glucose blood test strip 1 each by Other route as needed for other. Insert 1 by subcutaneous route 2 times every day check blood sugar before breakfast and dinner    [provider]  ?hydrOXYzine (VISTARIL) 50 MG capsule TAKE 1-2 CAPSULES BY MOUTH AS NEEDED FOR SLEEP AT BEDTIME 06/08/21   [name], FNP  ?Insulin Pen Needle (PEN NEEDLES) 32G X 4 MM MISC 1 each by Does not apply route once a week. 11/11/18   [name], FNP  ?Lasmiditan Succinate (REYVOW) 100 MG TABS Take 1 tablet by mouth daily as needed. ?Patient taking differently: Take 1 tablet by mouth daily as needed (Migraine). 12/31/19   Pieter Partridge, DO  ?linaclotide (LINZESS) 72 MCG capsule Take 72 mcg by mouth daily as needed (Constipation).    [provider]  ?lisinopril-hydrochlorothiazide (ZESTORETIC) 10-12.5 MG tablet TAKE 1 TABLET BY MOUTH EVERY DAY 11/03/21   [name], [place]  ?omeprazole (PRILOSEC) 40 MG capsule Take 40 mg by mouth daily.    [provider]  ?ondansetron (ZOFRAN-ODT) 4 MG disintegrating tablet 4 mg daily as needed for nausea or vomiting. 11/09/21   [provider]  ?OZEMPIC, 2 MG/DOSE, 8 MG/3ML SOPN Inject 2 mg into the skin once a week. 10/26/21   [provider]  ?phentermine 15 MG capsule TAKE 1 CAPSULE BY MOUTH EVERY DAY IN THE MORNING 09/07/21   [name], [place]  ?Polyethyl Glycol-Propyl Glycol (SYSTANE) 0.4-0.3 % SOLN Place 1 drop into both eyes daily.    [provider]   ?promethazine-dextromethorphan (PROMETHAZINE-DM) 6.25-15 MG/5ML syrup Take 5 mLs by mouth 4 (four) times daily as needed for cough. ?Patient not taking: Reported on 09/23/2021 08/08/21   Lynden Oxford Scales, PA-C  ?saccharomyces boulardii (FLORASTOR) 250 MG capsule Take 250 mg by mouth daily. gummy    [provider]  ?tirzepatide [name]) 2.5 MG/0.5ML Pen Inject 2.5 mg into the skin once a week. ?Patient not taking: Reported on 09/23/2021 07/19/21   [name], [place]  ?tiZANidine (ZANAFLEX) 4 MG tablet Take 4 mg by mouth daily as needed for muscle spasms. 11/09/21   [provider]  ?topiramate (TOPAMAX) 50 MG tablet TAKE 2 AND 1/2 TABLETS (125 MG TOTAL) BY MOUTH AT BEDTIME. ?Patient taking differently: Take 100 mg by mouth at bedtime. 09/02/21   Pieter Partridge, DO  ?Vitamin D, Ergocalciferol, (DRISDOL) 1.25 MG (50000 UNIT) CAPS capsule TAKE  1 CAPSULE BY MOUTH EVERY 7 DAYS **NOT COVERED** 09/26/21   [name], FNP  ?metFORMIN (GLUCOPHAGE-XR) 500 MG 24 hr tablet Take 2 tablets (1,000 mg total) by mouth 2 (two) times daily. ?Patient not taking: Reported on 06/05/2019 03/10/16 08/15/19  [name], MD  ?Phentermine-Topiramate 11.25-69 MG CP24 Take 1 tablet by mouth daily. 02/04/19 04/07/19  [name], FNP  ?  ? ?Family History  ?Problem Relation Age of Onset  ? Diabetes Mother   ? Hypertension Mother   ? Diabetes Father   ? Hypertension Father   ? Kidney failure Father   ? Hypertension Brother   ? Diabetes Maternal Grandmother   ? Pancreatic cancer Maternal Grandmother   ? Diabetes Paternal Grandmother   ? Breast cancer Paternal Grandmother   ? Stroke Paternal Grandfather   ? Cancer Maternal Aunt   ?     breast  ? Breast cancer Maternal Aunt   ? Healthy Son   ? Healthy Son   ? ? ?Social History  ? ?Socioeconomic History  ? Marital status: Legally Separated  ?  Spouse name: Jeneen Rinks  ? Number of children: 2  ? Years of education: 42  ? Highest education level: Some college, no degree  ?Occupational  History  ? Occupation: Web designer  ?  Employer: [employer]  ?Tobacco Use  ? Smoking status: Former  ?  Packs/day: 0.50  ?  Years: 25.00  ?  Pack years: 12.50  ?  Types: Cigarettes  ?  Start date: 7/20/202

## 2021-11-21 NOTE — Anesthesia Procedure Notes (Signed)
Procedure Name: Intubation ?Date/Time: 11/21/2021 8:53 AM ?Performed by: Barrington Ellison, CRNA ?Pre-anesthesia Checklist: Patient identified, Emergency Drugs available, Suction available and Patient being monitored ?Patient Re-evaluated:Patient Re-evaluated prior to induction ?Oxygen Delivery Method: Circle System Utilized ?Preoxygenation: Pre-oxygenation with 100% oxygen ?Induction Type: IV induction ?Ventilation: Mask ventilation without difficulty ?Laryngoscope Size: Mac and 3 ?Grade View: Grade I ?Tube type: Oral ?Tube size: 7.0 mm ?Number of attempts: 1 ?Airway Equipment and Method: Stylet and Oral airway ?Placement Confirmation: ETT inserted through vocal cords under direct vision, positive ETCO2 and breath sounds checked- equal and bilateral ?Secured at: 22 cm ?Tube secured with: Tape ?Dental Injury: Teeth and Oropharynx as per pre-operative assessment  ? ? ? ? ?

## 2021-11-21 NOTE — Progress Notes (Addendum)
Patient seen in Neuro ICU  post  46 y.o. female outpatient. History of DM, anemia, ADHD, GERD, HTN headaches with right sided pulsatile tinnitus.  Found to have high grade stenosis of the left sigmoid sinus s/p revascularization on 10.24.22. MRV from 1.24.23 reads Interval distal left transverse sinus and proximal sigmoid sinus stent placement. Stent is presumed to be patent given enhancement beyond. Marked distal right transverse sinus stenosis. Known left cerebellar AVM is incidentally identified. Compared to the prior study, there is decreased size of a focal area of venous ectasia or aneurysm formation. Patient was seen in [facility] clinic on 2.24.23 with Dr. Estanislado Pandy. There the patient reported an increase in symptoms including right sided pulsatile tinnitus that wakes her up from sleep. nausea vomiting and blurred vision. On 3.20.23  the Patient under went right common carotid arteriogram intravenous face. Status post endovascular placement of an 8 mm x 40 mm hyper/stem for full presence of webs with stenosis in the right sigmoid sinus approximately..   ? ? ?Patient seen at bedside with Dr. Luanne Bras. Patient reporting light sensitivity and headache without tinnitus. Denies n/v, chest pain, dyspnea. Neuro exam unchanged from pre-procedure, no deficits noted. Alert, aware and oriented X 3, Speech clear and comprehension is intact. PERRLA bilaterally, No facial droop noted. Can spontaneously move all 4 extremities. ? ?Right CFA and right radial puncture site clean, dry, dressed appropriately, no active bleeding, soft, appropriately tender to palpation. ? ?- NIR will assess patient in AM for potential discharge ?  ?Please call Dr. Estanislado Pandy with overnight concerns. ?  ? ?

## 2021-11-21 NOTE — Transfer of Care (Signed)
Immediate Anesthesia Transfer of Care Note ? ?Patient: Virginia Douglas ? ?Procedure(s) Performed: IR WITH ANESTHESIA STENTING ? ?Patient Location: PACU ? ?Anesthesia Type:General ? ?Level of Consciousness: oriented and drowsy ? ?Airway & Oxygen Therapy: Patient Spontanous Breathing ? ?Post-op Assessment: Report given to RN ? ?Post vital signs: Reviewed and stable ? ?Last Vitals:  ?Vitals Value Taken Time  ?BP 124/78 11/21/21 1121  ?Temp    ?Pulse 81 11/21/21 1123  ?Resp 20 11/21/21 1123  ?SpO2 97 % 11/21/21 1123  ?Vitals shown include unvalidated device data. ? ?Last Pain:  ?Vitals:  ? 11/21/21 0629  ?TempSrc:   ?PainSc: 4   ?   ? ?Patients Stated Pain Goal: 0 (11/21/21 4174) ? ?Complications: No notable events documented. ?

## 2021-11-21 NOTE — Progress Notes (Signed)
ANTICOAGULATION CONSULT NOTE ? ?Pharmacy Consult for heparin ?Indication:  post-interventional neuroradiology procedure ? ?Heparin Dosing Weight: 81.8 kg ? ?Labs: ?Recent Labs  ?  11/21/21 ?0603  ?HGB 12.8  ?HCT 38.4  ?PLT 327  ?LABPROT 13.7  ?INR 1.1  ?CREATININE 0.71  ? ? ?Assessment: ?47 yof presenting with right transverse sinus/sigmoid sinus junction stenosis, s/p IR intervention. Pharmacy consulted to dose heparin per post-interventional neuroradiology procedure protocol - heparin already started at 500 units/hr post-op. CBC wnl. No bleed issues reported per discussion with RN. Patient is not on anticoagulation PTA. ? ?Goal of Therapy:  ?Heparin level 0.1-0.25 units/ml ?Monitor platelets by anticoagulation protocol: Yes ?  ?Plan:  ?Increase heparin to 750 units/hr ?Check 6hr heparin level ?Monitor CBC ?Heparin off at 0800 on 3/21 per protocol ? ? ?Arturo Morton, PharmD, BCPS ?Clinical Pharmacist ?11/21/2021 12:52 PM ? ?

## 2021-11-21 NOTE — Progress Notes (Signed)
ANTICOAGULATION CONSULT NOTE ? ?Pharmacy Consult for heparin ?Indication:  post-interventional neuroradiology procedure ? ?Heparin Dosing Weight: 81.8 kg ? ?Labs: ?Recent Labs  ?  11/21/21 ?0603 11/21/21 ?1927  ?HGB 12.8  --   ?HCT 38.4  --   ?PLT 327  --   ?LABPROT 13.7  --   ?INR 1.1  --   ?HEPARINUNFRC  --  <0.10*  ?CREATININE 0.71  --   ? ? ? ?Assessment: ?24 yof presenting with right transverse sinus/sigmoid sinus junction stenosis, s/p IR intervention. Pharmacy consulted to dose heparin per post-interventional neuroradiology procedure protocol - heparin already started at 500 units/hr post-op. CBC wnl. No bleed issues reported per discussion with RN. Patient is not on anticoagulation PTA. ? ?HL<0.1. We will increase rate tonight with plan to stop at 0800 in AM ?Goal of Therapy:  ?Heparin level 0.1-0.25 units/ml ?Monitor platelets by anticoagulation protocol: Yes ?  ?Plan:  ?Increase heparin to 900 units/hr ?Check AM heparin level ?Monitor CBC ?Heparin off at 0800 on 3/21 per protocol ? ? ?Onnie Boer, PharmD, BCIDP, AAHIVP, CPP ?Infectious Disease Pharmacist ?11/21/2021 8:25 PM ? ? ? ?

## 2021-11-21 NOTE — Procedures (Signed)
INR ?Right common carotid arteriogram in right transverse sinus sigmoid sinus venograms.  Right radial artery followed arterial approach.  Right common femoral vein approach for  venous access. ? ?Status post right common carotid arteriogram intravenous face.   ?Status post endovascular placement of an 8 mm x 40 mm hyper/stem for full presence of webs with stenosis in the right sigmoid sinus approximately.Marland Kitchen   ?Postprocedural CT of the brain shows no hemorrhage. ? ?Manual compression for hemostasis of the right, common femoral venous access site. ? ?Wristband applied for hemostasis at the arterial radial access. ?Patient extubated ? ?Pupils 2 mm right equal left.  Sluggish.  No facial asymmetry. ? ?Patient opens eyes to command.  Moves all fours equally. ?Right groin puncture site soft.  Distal pulses intact. ? ?Arlean Hopping MD ? ?

## 2021-11-21 NOTE — Anesthesia Postprocedure Evaluation (Signed)
Anesthesia Post Note ? ?Patient: Virginia Douglas ? ?Procedure(s) Performed: IR WITH ANESTHESIA STENTING ? ?  ? ?Patient location during evaluation: PACU ?Anesthesia Type: General ?Level of consciousness: awake and alert, oriented and patient cooperative ?Pain management: pain level controlled ?Vital Signs Assessment: post-procedure vital signs reviewed and stable ?Respiratory status: spontaneous breathing, nonlabored ventilation and respiratory function stable ?Cardiovascular status: blood pressure returned to baseline and stable ?Postop Assessment: no apparent nausea or vomiting ?Anesthetic complications: no ? ? ?No notable events documented. ? ?Last Vitals:  ?Vitals:  ? 11/21/21 0626  ?BP: 120/71  ?Pulse: 85  ?Resp: 18  ?Temp: 36.6 ?C  ?SpO2: 100%  ?  ?Last Pain:  ?Vitals:  ? 11/21/21 0629  ?TempSrc:   ?PainSc: 4   ? ? ?  ?  ?  ?  ?  ?  ? ?[name] ? ? ? ? ?

## 2021-11-21 NOTE — H&P (Deleted)
  The note originally documented on this encounter has been moved the the encounter in which it belongs.  

## 2021-11-22 ENCOUNTER — Encounter (HOSPITAL_COMMUNITY): Payer: Self-pay | Admitting: Interventional Radiology

## 2021-11-22 DIAGNOSIS — G08 Intracranial and intraspinal phlebitis and thrombophlebitis: Secondary | ICD-10-CM | POA: Diagnosis not present

## 2021-11-22 LAB — CBC WITH DIFFERENTIAL/PLATELET
Abs Immature Granulocytes: 0.06 10*3/uL (ref 0.00–0.07)
Basophils Absolute: 0 10*3/uL (ref 0.0–0.1)
Basophils Relative: 0 %
Eosinophils Absolute: 0 10*3/uL (ref 0.0–0.5)
Eosinophils Relative: 0 %
HCT: 34.6 % — ABNORMAL LOW (ref 36.0–46.0)
Hemoglobin: 11.1 g/dL — ABNORMAL LOW (ref 12.0–15.0)
Immature Granulocytes: 0 %
Lymphocytes Relative: 28 %
Lymphs Abs: 3.7 10*3/uL (ref 0.7–4.0)
MCH: 27.5 pg (ref 26.0–34.0)
MCHC: 32.1 g/dL (ref 30.0–36.0)
MCV: 85.9 fL (ref 80.0–100.0)
Monocytes Absolute: 0.9 10*3/uL (ref 0.1–1.0)
Monocytes Relative: 7 %
Neutro Abs: 8.7 10*3/uL — ABNORMAL HIGH (ref 1.7–7.7)
Neutrophils Relative %: 65 %
Platelets: 318 10*3/uL (ref 150–400)
RBC: 4.03 MIL/uL (ref 3.87–5.11)
RDW: 15.6 % — ABNORMAL HIGH (ref 11.5–15.5)
WBC: 13.5 10*3/uL — ABNORMAL HIGH (ref 4.0–10.5)
nRBC: 0 % (ref 0.0–0.2)

## 2021-11-22 LAB — BASIC METABOLIC PANEL
Anion gap: 6 (ref 5–15)
BUN: 6 mg/dL (ref 6–20)
CO2: 19 mmol/L — ABNORMAL LOW (ref 22–32)
Calcium: 8.6 mg/dL — ABNORMAL LOW (ref 8.9–10.3)
Chloride: 112 mmol/L — ABNORMAL HIGH (ref 98–111)
Creatinine, Ser: 0.66 mg/dL (ref 0.44–1.00)
GFR, Estimated: >60 mL/min (ref >60)
Glucose, Bld: 111 mg/dL — ABNORMAL HIGH (ref 70–99)
Potassium: 3.3 mmol/L — ABNORMAL LOW (ref 3.5–5.1)
Sodium: 137 mmol/L (ref 135–145)

## 2021-11-22 LAB — GLUCOSE, CAPILLARY: Glucose-Capillary: 114 mg/dL — ABNORMAL HIGH (ref 70–99)

## 2021-11-22 LAB — HEPARIN LEVEL (UNFRACTIONATED): Heparin Unfractionated: <0.1 IU/mL — ABNORMAL LOW (ref 0.30–0.70)

## 2021-11-22 MED ORDER — POTASSIUM CHLORIDE CRYS ER 20 MEQ PO TBCR
40.0000 meq | EXTENDED_RELEASE_TABLET | Freq: Once | ORAL | Status: AC
  Administered 2021-11-22: 40 meq via ORAL
  Filled 2021-11-22: qty 2

## 2021-11-22 NOTE — Discharge Summary (Signed)
? ?Patient ID: ?Virginia Douglas ?MRN: [id number] ?DOB/AGE: 10-Oct-1975 46 y.o. ? ?Admit date: 11/21/2021 ?Discharge date: 11/22/2021 ? ?Supervising Physician: Luanne Bras ? ?Patient Status: [facility] - [facility]-pt ? ?Admission Diagnoses: Right transverse sinus/sigmoid sinus junction stenosis ?Discharge Diagnoses:  ?Principal Problem: ?  Cerebral venous thrombosis of sigmoid sinus ?Active Problems: ?  Pulsatile tinnitus of right ear ? ? ?Discharged Condition: good ? ?Hospital Course: 46 y/o F well known to [facility] service due to previous left sigmoid sinus angioplasty/stent placement 06/27/21, left cerebellar AVM, right sided pulsatile tinnitus and recurrent headaches who presented to [facility] as an outpatient on 11/21/21 for planned cerebral angiogram with possible intervention. Virginia Douglas underwent right common carotid arteriogram and right transverse sinus sigmoid sinus venograms via right radial artery and right common femoral vein and right sigmoid sinus stent placement without immediate complication. She was admitted for overnight observation and is planned for discharge today. ? ?Virginia Douglas reported headache yesterday post procedure which was helped by Toradol, she was able to tolerate oral intake all evening without issue. This morning she denies any complaints and is ready to go home. She is not sure if she can still hear the noise in her right ear. She has several family members that live at home and her best friend is also coming to stay with her while she recovers. 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 Discharge instructions and ED return precautions reviewed to which patient states understanding. RN to remove arterial line and foley this morning, once patient can ambulate appropriately and void she may discharge home. ? ?Consults: None ? ?Significant Diagnostic Studies: ?IR Radiologist Eval & Mgmt ? ?Result Date: 10/30/2021 ?EXAM: ESTABLISHED PATIENT OFFICE  VISIT CHIEF COMPLAINT: Headaches with right-sided pulsatile tinnitus. Current Pain Level: 1-10 HISTORY OF PRESENT ILLNESS: Patient is a 46 year old right handed lady who is status post endovascular revascularization of symptomatic high-grade stenosis of the left sigmoid sinus on 06/27/2021. She was last seen in consultation on 08/26/2021. She returns today to discuss the findings of the recent MRV of the brain with and without contrast performed on 09/26/2021. Clinically, the patient continues to report debilitating right-sided pulsatile tinnitus mostly at night which interferes with her sleep. 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 She reports she continues to have generalized headaches which involve her occipital region sharp in nature sudden onset within intensity of 8/10. These may be associated with nausea and vomiting with blurred vision. She also reports bifrontal significant headaches which may be pulsatile worse on the right than on the left with the intensity of 9/10. After vomiting, there is no relief of her headaches. She reports her vision to be sometimes blurred with occasional diplopia. She denies any episodes of loss of consciousness or of seizure-like activity. The previously left-sided pulsatile tinnitus is no longer appreciated. She denies any recent chills, fever or rigors. She denies any chest pain, shortness of breath, palpitations or pedal edema. She denies any wheezing, coughing or hemoptysis. Denies any dysuria, hematuria or pyuria. She denies any difficulty with persistent nausea, vomiting, or dysphagia, or abdominal pain, constipation, diarrhea or melena. The history is provided by the patient. Past Medical History: Diagnosis * : Date . * : ADHD (attention deficit hyperactivity disorder) * : . * : Anemia * : * : iron deficiency   as a teenager . * : Anxiety * : . * :  Arthritis * : . * : Diabetes ([place]) * : . * :  Edema * : . * : GERD (gastroesophageal reflux disease) * : . * : Hypertension * : * : patient denies- patient stated that the PCP said it was for heart prevention. . * : Migraine headache * : Patient Active Problem List * : Diagnosis * : Date Noted . * : Pulsatile tinnitus, bilateral * : 06/27/2021 . * : DDD (degenerative disc disease), cervical * : 12/06/2020 . * : DDD (degenerative disc disease), lumbar * : 12/06/2020 . * : Gastroesophageal reflux disease with esophagitis without hemorrhage * : 12/06/2020 . * : Increased intracranial pressure * : 12/06/2020 . * : PTSD (post-traumatic stress disorder) * : 12/06/2020 . * : Attention deficit hyperactivity disorder (ADHD), predominantly inattentive type * : 12/06/2020 . * : Vitamin D deficiency * : 12/06/2020 . * : Obesity (BMI 30-39.9) * : 03/15/2020 . * : Sciatic nerve pain * : 12/10/2019 . * : Acute pain of left shoulder * : 06/05/2019 . * : Grief * : 06/05/2019 . * : Migraine * : 06/05/2019 . * : Type 2 diabetes mellitus without complication, without long-term current use of insulin ([place]) * : 04/07/2019 . * : Depression * : 04/07/2019 . * : Obesity due to excess calories without serious comorbidity * : 04/07/2019 . * : Left leg pain * : 04/07/2019 . * : Anxiety * : 04/07/2019 . * : Headache * : 07/06/2018 . * : Diabetes ([place]) * : 12/08/2015 . * : Essential hypertension * : 12/08/2015 . * : Smoker * : 12/08/2015 . * : LLQ pain * : 12/08/2015 . * : Paresthesia * : 09/16/2015 . * : Urinary urgency * : 09/16/2015 Past Surgical History: Procedure * : Laterality * : Date . * : ABDOMINAL HYSTERECTOMY * : * : . * : CHOLECYSTECTOMY * : * : . * : COLONOSCOPY * : * : 12/28/2020 * : polyps . * : ESOPHAGEAL DILATION * : * : . * : IR ANGIO INTRA EXTRACRAN SEL COM CAROTID INNOMINATE BILAT MOD SED * : * : 05/31/2021 . * : IR ANGIO INTRA EXTRACRAN SEL COM CAROTID INNOMINATE BILAT MOD SED * : * : 06/27/2021 . * : IR ANGIO VERTEBRAL SEL VERTEBRAL UNI R MOD SED * : * : 05/31/2021 . *  : IR CT HEAD LTD * : * : 06/27/2021 . * : IR INTRA CRAN STENT * : * : 06/27/2021 . * : IR RADIOLOGIST EVAL & MGMT * : * : 06/03/2021 . * : IR RADIOLOGIST EVAL & MGMT * : * : 07/13/2021 . * : IR US GUIDE VASC ACCESS RIGHT * : * : 05/31/2021 . * : IR US GUIDE VASC ACCESS RIGHT * : * : 06/27/2021 . * : OVARIAN CYST REMOVAL * : * : . * : RADIOLOGY WITH ANESTHESIA * : N/A * : 06/27/2021 * : Procedure: STENTING; Surgeon: Luanne Bras, MD; Location: [place]; Service: Radiology; Laterality: N/A; No obstetric history on file. Prior to Admission medications Medication * : Sig * : Start Date * : End Date * : Taking? * : Authorizing Provider ipratropium (ATROVENT) 0.06 % nasal spray * : Place 2 sprays into both nostrils 4 (four) times daily. As needed for nasal congestion, runny nose * : 08/08/21 * : * : Yes * : Lynden Oxford Scales, PA-C methylPREDNISolone (MEDROL DOSEPAK) 4 MG TBPK tablet * : Take 24  mg on day 1, 20 mg on day 2, 16 mg on day 3, 12 mg on day 4, 8 mg on day 5, 4 mg on day 6. * : 08/08/21 * : * : Yes * : Lynden Oxford Scales, PA-C acetaZOLAMIDE (DIAMOX) 250 MG tablet * : TAKE 1 TABLET BY MOUTH TWICE A DAYPatient taking differently: Take 500 mg by mouth daily. * : 03/23/21 * : * : * Tomi Likens, Adam R, DO ADDERALL XR 30 MG 24 hr capsule * : Take 30 mg by mouth daily. * : 09/29/20 * : * : * : [provider] AJOVY 225 MG/1.5ML SOAJ * : INJECT 225 MG INTO THE SKIN EVERY 28 (TWENTY-EIGHT) DAYS. * : 08/08/21 * : * : * Tomi Likens, Stephan Minister, DO aspirin EC 81 MG tablet * : Take 81 mg by mouth daily. Swallow whole. * : * : * : * : [provider] atorvastatin (LIPITOR) 10 MG tablet * : TAKE 1 TABLET BY MOUTH EVERY DAY * : 04/26/21 * : * : * [name], FNP Biotin 5 MG CAPS * : Take 0.5 mg by mouth. * : * : * : * : [provider] Blood Glucose Monitoring Suppl (ONETOUCH VERIO IQ SYSTEM) w/Device KIT * : by Does not apply route. Use to check blood sugars twice * : * : * : * : [provider] buPROPion (WELLBUTRIN XL) 150 MG 24 hr tablet * : TAKE 1 TABLET BY MOUTH EVERY DAY IN THE MORNING * : 05/26/21 * : * : * [name], FNP cetirizine (ZYRTEC) 10 MG tablet * : Take 10 mg by mouth d

## 2021-11-22 NOTE — Progress Notes (Signed)
Patient ambulated without difficulty around unit with staff supervision. No assistance required. Aline, PIVs, and Foley discontinued. +void. ?IR PA aware, discharge to home ordered. All discharge instructions reviewed with patient with good understanding verbalized. Patient escorted off unit in w/c via volunteer services. All belongings taken. Patient's best friend waiting in lobby to transport home. VSS ?

## 2021-11-22 NOTE — Progress Notes (Signed)
ANTICOAGULATION CONSULT NOTE - Follow Up Consult ? ?Pharmacy Consult for heparin ?Indication: post-interventional neuroradiology procedure ? ?No Known Allergies ? ?Patient Measurements: ?Height: '5\' 6"'$  (167.6 cm) ?Weight: 99.8 kg (220 lb) ?IBW/kg (Calculated) : 59.3 ?Heparin Dosing Weight: 82 kg ? ?Vital Signs: ?Temp: 98.5 ?F (36.9 ?C) (03/21 0400) ?Temp Source: Oral (03/21 0400) ?BP: 109/58 (03/21 0500) ?Pulse Rate: 74 (03/21 0500) ? ?Labs: ?Recent Labs  ?  11/21/21 ?0603 11/21/21 ?1927 11/22/21 ?0533  ?HGB 12.8  --  11.1*  ?HCT 38.4  --  34.6*  ?PLT 327  --  318  ?LABPROT 13.7  --   --   ?INR 1.1  --   --   ?HEPARINUNFRC  --  <0.10* <0.10*  ?CREATININE 0.71  --  0.66  ? ? ?Estimated Creatinine Clearance: 107 mL/min (by C-G formula based on SCr of 0.66 mg/dL). ? ? ?Assessment: ?75 yof presenting with right transverse sinus/sigmoid sinus junction stenosis, s/p IR intervention. Pharmacy consulted to dose heparin per post-interventional neuroradiology procedure protocol - heparin already started at 500 units/hr post-op. CBC wnl. No bleed issues reported per discussion with RN. Patient is not on anticoagulation PTA. ?  ?HL<0.1. We will increase rate tonight with plan to stop at 0800 in AM ? ?Goal of Therapy:  ?Heparin level 0.1-0.25 units/ml ?Monitor platelets by anticoagulation protocol: Yes ?  ?Plan:  ?Continue heparin at 900 units/hr (stopping in 1 hour 30 minutes) ?Continue to monitor H&H and platelets ? ?Carma Lair, PharmD Candidate [phone] ?11/22/2021,6:19 AM ? ? ?

## 2021-11-22 NOTE — Progress Notes (Signed)
?  Transition of Care (TOC) Screening Note ? ? ?Patient Details  ?Name: Virginia Douglas ?Date of Birth: [birth year] ? ? ?Transition of Care (TOC) CM/SW Contact:    ?Benard Halsted, LCSW ?Phone Number: ?11/22/2021, 10:08 AM ? ? ? ?Transition of Care Department [facility]) has reviewed patient and no TOC needs have been identified at this time. We will continue to monitor patient advancement through interdisciplinary progression rounds. If new patient transition needs arise, please place a TOC consult. ? ? ?

## 2021-11-22 NOTE — Discharge Instructions (Signed)
Femoral Site Care This sheet gives you information about how to care for yourself after your procedure. Your health care provider may also give you more specific instructions. If you have problems or questions, contact your health care provider. What can I expect after the procedure? After the procedure, it is common to have: Bruising that usually fades within 1-2 weeks. Tenderness at the site. Follow these instructions at home: Wound care Follow instructions from your health care provider about how to take care of your insertion site. Make sure you: Wash your hands with soap and water before you change your bandage (dressing). If soap and water are not available, use hand sanitizer. Change your dressing as directed- pressure dressing removed 24 hours post-procedure (and switch for bandaid), bandaid removed 72 hours post-procedure Do not take baths, swim, or use a hot tub for 7 days post-procedure. You may shower 48 hours after the procedure or as told by your health care provider. Gently wash the site with plain soap and water. Pat the area dry with a clean towel. Do not rub the site. This may cause bleeding. Check your site every day for signs of infection. Check for: Redness, swelling, or pain. Fluid or blood. Warmth. Pus or a bad smell. Activity Do not stoop, bend, or lift anything that is heavier than 10 lb (4.5 kg) for 2 weeks post-procedure. Do not drive self for 2 weeks post-procedure. Contact a health care provider if you have: A fever or chills. You have redness, swelling, or pain around your insertion site. Get help right away if: The catheter insertion area swells very fast. You pass out. You suddenly start to sweat or your skin gets clammy. The catheter insertion area is bleeding, and the bleeding does not stop when you hold steady pressure on the area. The area near or just beyond the catheter insertion site becomes pale, cool, tingly, or numb. These symptoms may  represent a serious problem that is an emergency. Do not wait to see if the symptoms will go away. Get medical help right away. Call your local emergency services (911 in the [place] Do not drive yourself to the hospital.  This information is not intended to replace advice given to you by your health care provider. Make sure you discuss any questions you have with your health care provider. Document Revised: 09/03/2017 Document Reviewed: 09/03/2017 Elsevier Patient Education  2020 Elsevier Inc.  

## 2021-12-05 ENCOUNTER — Other Ambulatory Visit: Payer: Self-pay | Admitting: Nurse Practitioner

## 2021-12-05 ENCOUNTER — Ambulatory Visit (HOSPITAL_COMMUNITY)
Admission: RE | Admit: 2021-12-05 | Discharge: 2021-12-05 | Disposition: A | Discharge status: Home / Self Care | Payer: No Typology Code available for payment source | Source: Ambulatory Visit | Attending: Physician Assistant | Admitting: Physician Assistant

## 2021-12-05 DIAGNOSIS — Z1231 Encounter for screening mammogram for malignant neoplasm of breast: Secondary | ICD-10-CM

## 2021-12-05 DIAGNOSIS — I771 Stricture of artery: Secondary | ICD-10-CM

## 2021-12-05 DIAGNOSIS — G08 Intracranial and intraspinal phlebitis and thrombophlebitis: Secondary | ICD-10-CM

## 2021-12-05 DIAGNOSIS — H93A1 Pulsatile tinnitus, right ear: Secondary | ICD-10-CM

## 2021-12-06 HISTORY — PX: IR RADIOLOGIST EVAL & MGMT: IMG5224

## 2021-12-07 ENCOUNTER — Encounter: Payer: Self-pay | Admitting: Nurse Practitioner

## 2021-12-07 ENCOUNTER — Ambulatory Visit (INDEPENDENT_AMBULATORY_CARE_PROVIDER_SITE_OTHER): Payer: No Typology Code available for payment source | Admitting: Nurse Practitioner

## 2021-12-07 VITALS — BP 128/70 | HR 97 | Temp 98.7°F | Ht 66.0 in | Wt 221.0 lb

## 2021-12-07 DIAGNOSIS — G4709 Other insomnia: Secondary | ICD-10-CM | POA: Diagnosis not present

## 2021-12-07 DIAGNOSIS — R5383 Other fatigue: Secondary | ICD-10-CM

## 2021-12-07 DIAGNOSIS — E119 Type 2 diabetes mellitus without complications: Secondary | ICD-10-CM

## 2021-12-07 DIAGNOSIS — E1169 Type 2 diabetes mellitus with other specified complication: Secondary | ICD-10-CM | POA: Diagnosis not present

## 2021-12-07 DIAGNOSIS — F419 Anxiety disorder, unspecified: Secondary | ICD-10-CM | POA: Diagnosis not present

## 2021-12-07 DIAGNOSIS — M25562 Pain in left knee: Secondary | ICD-10-CM | POA: Diagnosis not present

## 2021-12-07 DIAGNOSIS — I10 Essential (primary) hypertension: Secondary | ICD-10-CM

## 2021-12-07 DIAGNOSIS — Z01419 Encounter for gynecological examination (general) (routine) without abnormal findings: Secondary | ICD-10-CM

## 2021-12-07 DIAGNOSIS — E559 Vitamin D deficiency, unspecified: Secondary | ICD-10-CM

## 2021-12-07 DIAGNOSIS — M25561 Pain in right knee: Secondary | ICD-10-CM

## 2021-12-07 MED ORDER — OZEMPIC (2 MG/DOSE) 8 MG/3ML ~~LOC~~ SOPN: 2.0000 mg | PEN_INJECTOR | SUBCUTANEOUS | 1 refills | Status: DC

## 2021-12-07 MED ORDER — KETOROLAC TROMETHAMINE 30 MG/ML IJ SOLN
30.0000 mg | Freq: Once | INTRAMUSCULAR | Status: AC
  Administered 2021-12-07: 30 mg via INTRAMUSCULAR

## 2021-12-07 NOTE — Progress Notes (Signed)
?Industrial/product designer as a Education administrator for [employer], FNP.,have documented all relevant documentation on the behalf of [name], FNP,as directed by  [name], FNP while in the presence of [name], [place]. ? ?This visit occurred during the SARS-CoV-2 public health emergency.  Safety protocols were in place, including screening questions prior to the visit, additional usage of staff PPE, and extensive cleaning of exam room while observing appropriate contact time as indicated for disinfecting solutions. ? ?Subjective:  ?  ? Patient ID: Virginia Douglas , female    DOB: 21-Apr-1976 , 46 y.o.   MRN: [id number] ? ? ?Chief Complaint  ?Patient presents with  ? Hypertension  ? ? ?HPI ? ?The patient is here today for a blood pressure f/u.  She had surgery on central venous thrombosis - she followed up with vascular on Monday, she is due to have an MRI on her brain for the AVM.  She may be going to [facility] for the AVM treatment ? ?She had to stop taking some of her medications the week of her surgery. Continues to take phentermine.  She is currently out of work ? ? ? ? ?Hypertension ?This is a chronic problem. The current episode started more than 1 year ago. The problem is unchanged. The problem is controlled. There are no associated agents to hypertension. Risk factors for coronary artery disease include obesity and sedentary lifestyle. Past treatments include diuretics and ACE inhibitors. The current treatment provides significant improvement. Compliance problems include exercise (unable due to back pain).  There is no history of chronic renal disease.  ?Insomnia ?Primary symptoms: fragmented sleep, frequent awakening.   ?The current episode started more than one month. The onset quality is sudden. The problem occurs nightly. The problem has been gradually worsening since onset. The symptoms are aggravated by family issues (tragic death of her mother). PMH includes: no hypertension.    ? ?Past Medical History:   ?Diagnosis Date  ? ADHD (attention deficit hyperactivity disorder)   ? Anemia   ? iron deficiency   as a teenager  ? Anxiety   ? Arthritis   ? Diabetes ([place])   ? Edema   ? GERD (gastroesophageal reflux disease)   ? Hypertension   ? patient denies- patient stated that the PCP said it was for heart prevention.  ? Migraine headache   ?  ? ?Family History  ?Problem Relation Age of Onset  ? Diabetes Mother   ? Hypertension Mother   ? Diabetes Father   ? Hypertension Father   ? Kidney failure Father   ? Hypertension Brother   ? Diabetes Maternal Grandmother   ? Pancreatic cancer Maternal Grandmother   ? Diabetes Paternal Grandmother   ? Breast cancer Paternal Grandmother   ? Stroke Paternal Grandfather   ? Cancer Maternal Aunt   ?     breast  ? Breast cancer Maternal Aunt   ? Healthy Son   ? Healthy Son   ? ? ? ?Current Outpatient Medications:  ?  acetaZOLAMIDE (DIAMOX) 250 MG tablet, TAKE 1 TABLET BY MOUTH TWICE A DAY (Patient taking differently: Take 500 mg by mouth daily.), Disp: 60 tablet, Rfl: 2 ?  ADDERALL XR 30 MG 24 hr capsule, Take 30 mg by mouth daily., Disp: , Rfl:  ?  aspirin EC 81 MG tablet, Take 81 mg by mouth daily. Swallow whole., Disp: , Rfl:  ?  atorvastatin (LIPITOR) 10 MG tablet, TAKE 1 TABLET BY MOUTH EVERY DAY, Disp: 90 tablet, Rfl:  1 ?  Blood Glucose Monitoring Suppl (ONETOUCH VERIO IQ SYSTEM) w/Device KIT, by Does not apply route. Use to check blood sugars twice, Disp: , Rfl:  ?  cetirizine (ZYRTEC) 10 MG tablet, Take 10 mg by mouth daily as needed for allergies., Disp: , Rfl:  ?  chlorhexidine (PERIDEX) 0.12 % solution, 5 mLs by Mouth Rinse route daily., Disp: , Rfl:  ?  clonazePAM (KLONOPIN) 0.5 MG tablet, TAKE 1 TABLET BY MOUTH THREE TIMES A DAY AS NEEDED FOR ANXIETY (Patient taking differently: Take 0.5 mg by mouth 3 (three) times a week.), Disp: 30 tablet, Rfl: 1 ?  clopidogrel (PLAVIX) 75 MG tablet, Take 1 tablet (75 mg total) by mouth daily., Disp: 30 tablet, Rfl: 3 ?  DULoxetine (CYMBALTA)  60 MG capsule, TAKE 1 CAPSULE BY MOUTH EVERY DAY, Disp: 90 capsule, Rfl: 1 ?  Erenumab-aooe (AIMOVIG) 140 MG/ML SOAJ, Inject 140 mg into the skin every 30 (thirty) days., Disp: 1.12 mL, Rfl: 2 ?  famotidine (PEPCID) 20 MG tablet, Take 20 mg by mouth daily., Disp: , Rfl:  ?  glucose blood test strip, 1 each by Other route as needed for other. Insert 1 by subcutaneous route 2 times every day check blood sugar before breakfast and dinner, Disp: , Rfl:  ?  Insulin Pen Needle (PEN NEEDLES) 32G X 4 MM MISC, 1 each by Does not apply route once a week., Disp: 30 each, Rfl: 3 ?  linaclotide (LINZESS) 72 MCG capsule, Take 72 mcg by mouth daily as needed (Constipation)., Disp: , Rfl:  ?  lisinopril-hydrochlorothiazide (ZESTORETIC) 10-12.5 MG tablet, TAKE 1 TABLET BY MOUTH EVERY DAY, Disp: 90 tablet, Rfl: 1 ?  omeprazole (PRILOSEC) 40 MG capsule, Take 40 mg by mouth daily., Disp: , Rfl:  ?  ondansetron (ZOFRAN-ODT) 4 MG disintegrating tablet, 4 mg daily as needed for nausea or vomiting., Disp: , Rfl:  ?  phentermine 15 MG capsule, TAKE 1 CAPSULE BY MOUTH EVERY DAY IN THE MORNING, Disp: 30 capsule, Rfl: 1 ?  Polyethyl Glycol-Propyl Glycol (SYSTANE) 0.4-0.3 % SOLN, Place 1 drop into both eyes daily., Disp: , Rfl:  ?  saccharomyces boulardii (FLORASTOR) 250 MG capsule, Take 250 mg by mouth daily. gummy, Disp: , Rfl:  ?  tiZANidine (ZANAFLEX) 4 MG tablet, Take 4 mg by mouth daily as needed for muscle spasms., Disp: , Rfl:  ?  topiramate (TOPAMAX) 50 MG tablet, TAKE 2 AND 1/2 TABLETS (125 MG TOTAL) BY MOUTH AT BEDTIME. (Patient taking differently: Take 100 mg by mouth at bedtime.), Disp: 75 tablet, Rfl: 5 ?  buPROPion (WELLBUTRIN XL) 150 MG 24 hr tablet, TAKE 1 TABLET BY MOUTH EVERY DAY IN THE MORNING, Disp: 90 tablet, Rfl: 1 ?  hydrOXYzine (VISTARIL) 50 MG capsule, TAKE 1-2 CAPSULES BY MOUTH AS NEEDED FOR SLEEP AT BEDTIME, Disp: 180 capsule, Rfl: 1 ?  OZEMPIC, 2 MG/DOSE, 8 MG/3ML SOPN, Inject 2 mg into the skin once a week., Disp:  9 mL, Rfl: 1 ?  Vitamin D, Ergocalciferol, (DRISDOL) 1.25 MG (50000 UNIT) CAPS capsule, TAKE 1 CAPSULE BY MOUTH EVERY 7 DAYS **NOT COVERED**, Disp: 12 capsule, Rfl: 0  ? ?No Known Allergies  ? ?Review of Systems  ?Constitutional: Negative.   ?Respiratory: Negative.    ?Cardiovascular: Negative.   ?Gastrointestinal: Negative.   ?Neurological: Negative.   ?Psychiatric/Behavioral:  The patient has insomnia.    ? ?Today's Vitals  ? 12/07/21 1216  ?BP: 128/70  ?Pulse: 97  ?Temp: 98.7 ?F (37.1 ?C)  ?TempSrc:  Oral  ?Weight: 221 lb (100.2 kg)  ?Height: 5' 6"  (1.676 m)  ? ?Body mass index is 35.67 kg/m?.  ? ?Objective:  ?Physical Exam ?Vitals reviewed.  ?Constitutional:   ?   General: She is not in acute distress. ?   Appearance: Normal appearance. She is well-developed. She is obese.  ?HENT:  ?   Head: Normocephalic and atraumatic.  ?Eyes:  ?   Pupils: Pupils are equal, round, and reactive to light.  ?Cardiovascular:  ?   Rate and Rhythm: Normal rate and regular rhythm.  ?   Pulses: Normal pulses.  ?   Heart sounds: Normal heart sounds. No murmur heard. ?Pulmonary:  ?   Effort: Pulmonary effort is normal. No respiratory distress.  ?   Breath sounds: Normal breath sounds. No wheezing.  ?Musculoskeletal:     ?   General: No tenderness.  ?Skin: ?   General: Skin is warm and dry.  ?   Capillary Refill: Capillary refill takes less than 2 seconds.  ?Neurological:  ?   General: No focal deficit present.  ?   Mental Status: She is alert and oriented to person, place, and time.  ?   Cranial Nerves: No cranial nerve deficit.  ?   Motor: No weakness.  ?Psychiatric:     ?   Mood and Affect: Mood normal.     ?   Behavior: Behavior normal.     ?   Thought Content: Thought content normal.     ?   Judgment: Judgment normal.  ?  ? ?   ?Assessment And Plan:  ?   ?1. Type 2 diabetes mellitus without complication, without long-term current use of insulin (HCC) ?- Urine Albumin-Creatinine with uACR ?- OZEMPIC, 2 MG/DOSE, 8 MG/3ML SOPN; Inject 2  mg into the skin once a week.  Dispense: 9 mL; Refill: 1 ? ?2. Essential hypertension ?Comments: Blood pressure is well controlled, continue current medications ?- Urine Albumin-Creatinine with uACR

## 2021-12-07 NOTE — Patient Instructions (Addendum)
Hypertension, Adult ?High blood pressure (hypertension) is when the force of blood pumping through the arteries is too strong. The arteries are the blood vessels that carry blood from the heart throughout the body. Hypertension forces the heart to work harder to pump blood and may cause arteries to become narrow or stiff. Untreated or uncontrolled hypertension can cause a heart attack, heart failure, a stroke, kidney disease, and other problems. ?A blood pressure reading consists of a higher number over a lower number. Ideally, your blood pressure should be below 120/80. The first ("top") number is called the systolic pressure. It is a measure of the pressure in your arteries as your heart beats. The second ("bottom") number is called the diastolic pressure. It is a measure of the pressure in your arteries as the heart relaxes. ?What are the causes? ?The exact cause of this condition is not known. There are some conditions that result in or are related to high blood pressure. ?What increases the risk? ?Some risk factors for high blood pressure are under your control. The following factors may make you more likely to develop this condition: ?Smoking. ?Having type 2 diabetes mellitus, high cholesterol, or both. ?Not getting enough exercise or physical activity. ?Being overweight. ?Having too much fat, sugar, calories, or salt (sodium) in your diet. ?Drinking too much alcohol. ?Some risk factors for high blood pressure may be difficult or impossible to change. Some of these factors include: ?Having chronic kidney disease. ?Having a family history of high blood pressure. ?Age. Risk increases with age. ?Race. You may be at higher risk if you are African American. ?Gender. Men are at higher risk than women before age 57. After age 32, women are at higher risk than men. ?Having obstructive sleep apnea. ?Stress. ?What are the signs or symptoms? ?High blood pressure may not cause symptoms. Very high blood pressure  (hypertensive crisis) may cause: ?Headache. ?Anxiety. ?Shortness of breath. ?Nosebleed. ?Nausea and vomiting. ?Vision changes. ?Severe chest pain. ?Seizures. ?How is this diagnosed? ?This condition is diagnosed by measuring your blood pressure while you are seated, with your arm resting on a flat surface, your legs uncrossed, and your feet flat on the floor. The cuff of the blood pressure monitor will be placed directly against the skin of your upper arm at the level of your heart. It should be measured at least twice using the same arm. Certain conditions can cause a difference in blood pressure between your right and left arms. ?Certain factors can cause blood pressure readings to be lower or higher than normal for a short period of time: ?When your blood pressure is higher when you are in a health care provider's office than when you are at home, this is called white coat hypertension. Most people with this condition do not need medicines. ?When your blood pressure is higher at home than when you are in a health care provider's office, this is called masked hypertension. Most people with this condition may need medicines to control blood pressure. ?If you have a high blood pressure reading during one visit or you have normal blood pressure with other risk factors, you may be asked to: ?Return on a different day to have your blood pressure checked again. ?Monitor your blood pressure at home for 1 week or longer. ?If you are diagnosed with hypertension, you may have other blood or imaging tests to help your health care provider understand your overall risk for other conditions. ?How is this treated? ?This condition is treated by making  healthy lifestyle changes, such as eating healthy foods, exercising more, and reducing your alcohol intake. Your health care provider may prescribe medicine if lifestyle changes are not enough to get your blood pressure under control, and if: ?Your systolic blood pressure is above  130. ?Your diastolic blood pressure is above 80. ?Your personal target blood pressure may vary depending on your medical conditions, your age, and other factors. ?Follow these instructions at home: ?Eating and drinking ? ?Eat a diet that is high in fiber and potassium, and low in sodium, added sugar, and fat. An example eating plan is called the DASH (Dietary Approaches to Stop Hypertension) diet. To eat this way: ?Eat plenty of fresh fruits and vegetables. Try to fill one half of your plate at each meal with fruits and vegetables. ?Eat whole grains, such as whole-wheat pasta, brown rice, or whole-grain bread. Fill about one fourth of your plate with whole grains. ?Eat or drink low-fat dairy products, such as skim milk or low-fat yogurt. ?Avoid fatty cuts of meat, processed or cured meats, and poultry with skin. Fill about one fourth of your plate with lean proteins, such as fish, chicken without skin, beans, eggs, or tofu. ?Avoid pre-made and processed foods. These tend to be higher in sodium, added sugar, and fat. ?Reduce your daily sodium intake. Most people with hypertension should eat less than 1,500 mg of sodium a day. ?Do not drink alcohol if: ?Your health care provider tells you not to drink. ?You are pregnant, may be pregnant, or are planning to become pregnant. ?If you drink alcohol: ?Limit how much you use to: ?0-1 drink a day for women. ?0-2 drinks a day for men. ?Be aware of how much alcohol is in your drink. In the [place] one drink equals one 12 oz bottle of beer (355 mL), one 5 oz glass of wine (148 mL), or one 1? oz glass of hard liquor (44 mL). ?Lifestyle ? ?Work with your health care provider to maintain a healthy body weight or to lose weight. Ask what an ideal weight is for you. ?Get at least 30 minutes of exercise most days of the week. Activities may include walking, swimming, or biking. ?Include exercise to strengthen your muscles (resistance exercise), such as Pilates or lifting weights, as  part of your weekly exercise routine. Try to do these types of exercises for 30 minutes at least 3 days a week. ?Do not use any products that contain nicotine or tobacco, such as cigarettes, e-cigarettes, and chewing tobacco. If you need help quitting, ask your health care provider. ?Monitor your blood pressure at home as told by your health care provider. ?Keep all follow-up visits as told by your health care provider. This is important. ?Medicines ?Take over-the-counter and prescription medicines only as told by your health care provider. Follow directions carefully. Blood pressure medicines must be taken as prescribed. ?Do not skip doses of blood pressure medicine. Doing this puts you at risk for problems and can make the medicine less effective. ?Ask your health care provider about side effects or reactions to medicines that you should watch for. ?Contact a health care provider if you: ?Think you are having a reaction to a medicine you are taking. ?Have headaches that keep coming back (recurring). ?Feel dizzy. ?Have swelling in your ankles. ?Have trouble with your vision. ?Get help right away if you: ?Develop a severe headache or confusion. ?Have unusual weakness or numbness. ?Feel faint. ?Have severe pain in your chest or abdomen. ?Vomit repeatedly. ?Have trouble  breathing. ?Summary ?Hypertension is when the force of blood pumping through your arteries is too strong. If this condition is not controlled, it may put you at risk for serious complications. ?Your personal target blood pressure may vary depending on your medical conditions, your age, and other factors. For most people, a normal blood pressure is less than 120/80. ?Hypertension is treated with lifestyle changes, medicines, or a combination of both. Lifestyle changes include losing weight, eating a healthy, low-sodium diet, exercising more, and limiting alcohol. ?This information is not intended to replace advice given to you by your health care  provider. Make sure you discuss any questions you have with your health care provider. ?Document Revised: 05/01/2018 Document Reviewed: 05/01/2018 ?Elsevier Patient Education ? [place]. ? ? ?Ginger/tumeric tea

## 2021-12-09 ENCOUNTER — Other Ambulatory Visit: Payer: Self-pay | Admitting: Nurse Practitioner

## 2021-12-09 DIAGNOSIS — F32A Depression, unspecified: Secondary | ICD-10-CM

## 2021-12-09 DIAGNOSIS — G4709 Other insomnia: Secondary | ICD-10-CM

## 2021-12-13 LAB — RENAL FUNCTION PANEL
Albumin: 5.3 g/dL — ABNORMAL HIGH (ref 3.8–4.8)
BUN/Creatinine Ratio: 12 (ref 9–23)
BUN: 10 mg/dL (ref 6–24)
CO2: 16 mmol/L — ABNORMAL LOW (ref 20–29)
Calcium: 10.9 mg/dL — ABNORMAL HIGH (ref 8.7–10.2)
Chloride: 105 mmol/L (ref 96–106)
Creatinine, Ser: 0.85 mg/dL (ref 0.57–1.00)
Glucose: 117 mg/dL — ABNORMAL HIGH (ref 70–99)
Phosphorus: 3.7 mg/dL (ref 3.0–4.3)
Potassium: 4.1 mmol/L (ref 3.5–5.2)
Sodium: 142 mmol/L (ref 134–144)
eGFR: 87 mL/min/{1.73_m2} (ref >59)

## 2021-12-13 LAB — VITAMIN B12

## 2021-12-13 LAB — VITAMIN D 25 HYDROXY (VIT D DEFICIENCY, FRACTURES): Vit D, 25-Hydroxy: 21.9 ng/mL — ABNORMAL LOW (ref 30.0–100.0)

## 2021-12-13 LAB — MICROALBUMIN / CREATININE URINE RATIO
Creatinine, Urine: 179 mg/dL
Microalb/Creat Ratio: 7 mg/g creat (ref 0–29)
Microalbumin, Urine: 13.2 ug/mL

## 2021-12-14 ENCOUNTER — Other Ambulatory Visit (HOSPITAL_COMMUNITY): Payer: Self-pay | Admitting: Interventional Radiology

## 2021-12-19 ENCOUNTER — Other Ambulatory Visit: Payer: Self-pay | Admitting: Nurse Practitioner

## 2021-12-25 ENCOUNTER — Other Ambulatory Visit: Payer: Self-pay | Admitting: Neurology

## 2021-12-26 ENCOUNTER — Ambulatory Visit: Payer: No Typology Code available for payment source | Admitting: Nurse Practitioner

## 2021-12-29 ENCOUNTER — Other Ambulatory Visit (HOSPITAL_COMMUNITY): Payer: Self-pay | Admitting: Interventional Radiology

## 2021-12-29 DIAGNOSIS — H93A9 Pulsatile tinnitus, unspecified ear: Secondary | ICD-10-CM

## 2021-12-29 DIAGNOSIS — Q2730 Arteriovenous malformation, site unspecified: Secondary | ICD-10-CM

## 2021-12-29 DIAGNOSIS — I771 Stricture of artery: Secondary | ICD-10-CM

## 2022-01-04 ENCOUNTER — Other Ambulatory Visit: Payer: Self-pay | Admitting: Nurse Practitioner

## 2022-01-04 ENCOUNTER — Other Ambulatory Visit (HOSPITAL_COMMUNITY): Payer: Self-pay

## 2022-01-04 DIAGNOSIS — E119 Type 2 diabetes mellitus without complications: Secondary | ICD-10-CM

## 2022-01-16 ENCOUNTER — Ambulatory Visit (HOSPITAL_COMMUNITY)
Admission: RE | Admit: 2022-01-16 | Discharge: 2022-01-16 | Disposition: A | Discharge status: Home / Self Care | Payer: No Typology Code available for payment source | Source: Ambulatory Visit | Attending: Interventional Radiology | Admitting: Interventional Radiology

## 2022-01-16 DIAGNOSIS — H93A9 Pulsatile tinnitus, unspecified ear: Secondary | ICD-10-CM | POA: Diagnosis present

## 2022-01-16 DIAGNOSIS — I771 Stricture of artery: Secondary | ICD-10-CM | POA: Diagnosis present

## 2022-01-16 DIAGNOSIS — Q2730 Arteriovenous malformation, site unspecified: Secondary | ICD-10-CM | POA: Insufficient documentation

## 2022-01-16 IMAGING — MR MR HEAD WO/W CM
13 of 15 series · 40 of 48 positions shown · IV contrast (gadavist)
Comparison: [DATE]

CLINICAL DATA: AVM with pulsatile tinnitus and recurrent headache.

EXAM:
MRI HEAD WITHOUT AND WITH CONTRAST
TECHNIQUE: Multiplanar, multiecho pulse sequences of the brain and surrounding
structures were obtained without and with intravenous contrast.
CONTRAST:  10mL GADAVIST GADOBUTROL 1 MMOL/ML IV SOLN

[Series 5: DWI · axial · 3.0mm · 0.88mm/px · z∈[-96,+47]mm · 6 of 100 slices shown (1 of 4)]
[im 1/100]
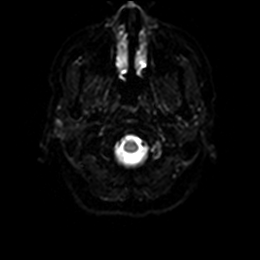
[im 20/100]
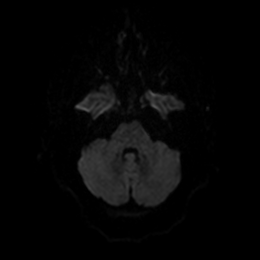
[im 40/100]
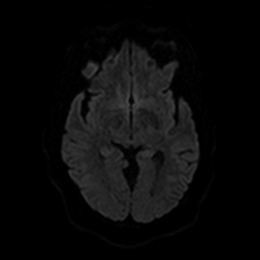
[im 60/100]
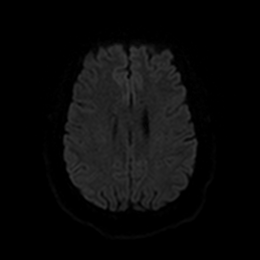
[im 80/100]
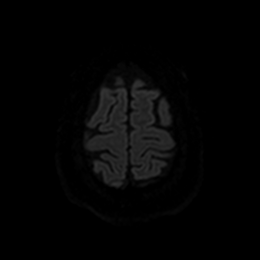
[im 100/100]
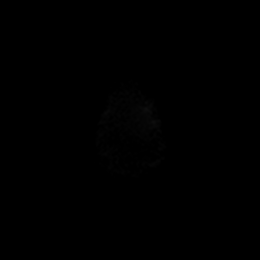

[Series 6: DWI · axial · 3.0mm · 0.88mm/px · z∈[-96,+47]mm · 3 of 50 slices shown (2 of 4)]
[im 1/50]
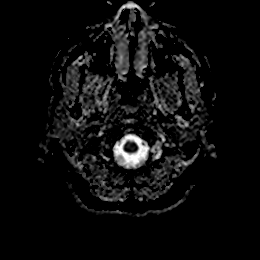
[im 25/50]
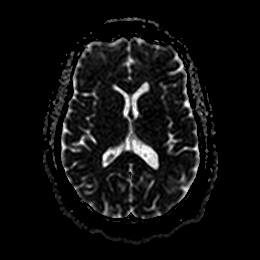
[im 50/50]
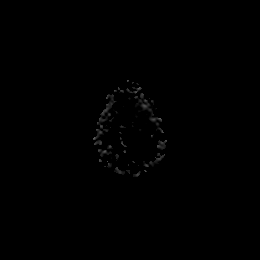

[Series 7: DWI · coronal · 4.0mm · 0.88mm/px · 5 of 70 slices shown (3 of 4)]
[im 1/70]
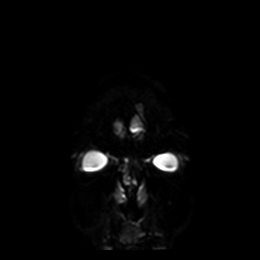
[im 18/70]
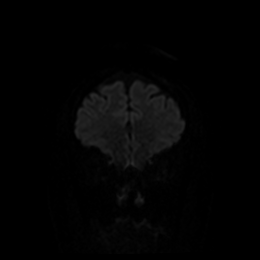
[im 35/70]
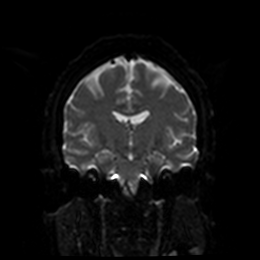
[im 52/70]
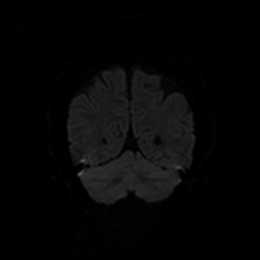
[im 70/70]
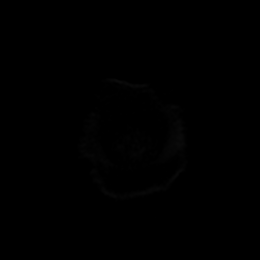

[Series 8: DWI · coronal · 4.0mm · 0.88mm/px · 2 of 35 slices shown (4 of 4)]
[im 1/35]
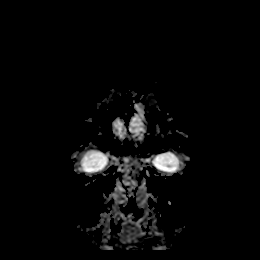
[im 35/35]
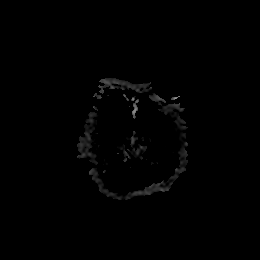

[Series 9: T1 · sagittal · 5.0mm · 0.75mm/px · 2 of 25 slices shown]
[im 1/25]
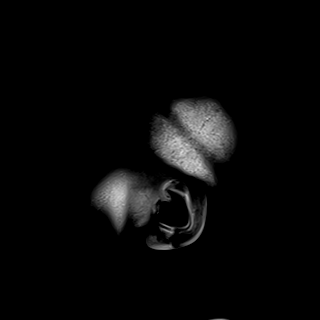
[im 25/25]
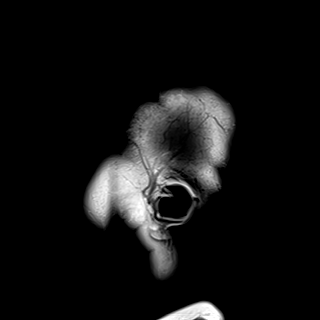

[Series 10: T2 · axial · 5.0mm · 0.72mm/px · z∈[-103,+49]mm · 2 of 27 slices shown]
[im 1/27]
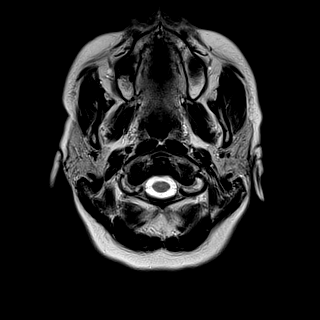
[im 27/27]
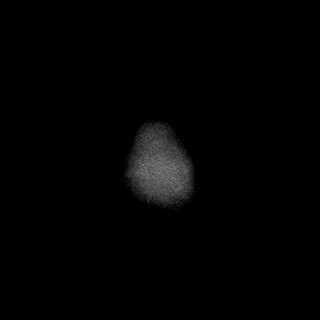

[Series 11: FLAIR · axial · 5.0mm · 0.45mm/px · z∈[-103,+48]mm · 2 of 27 slices shown]
[im 1/27]
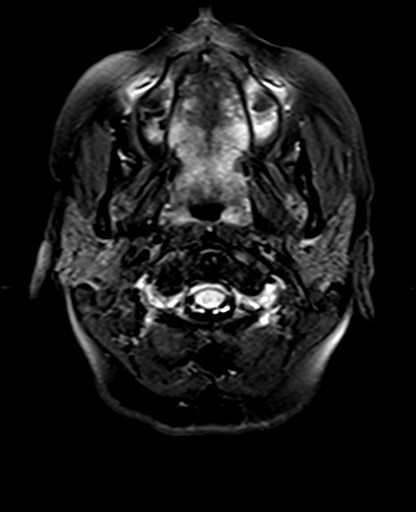
[im 27/27]
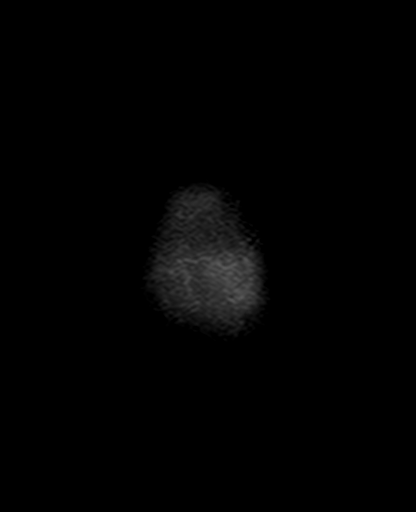

[Series 12: mag_images · axial · 3.0mm · 0.90mm/px · z∈[-121,+51]mm · 4 of 60 slices shown]
[im 1/60]
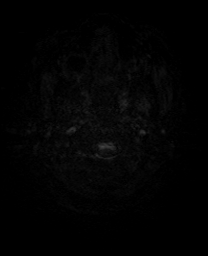
[im 20/60]
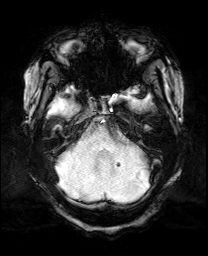
[im 40/60]
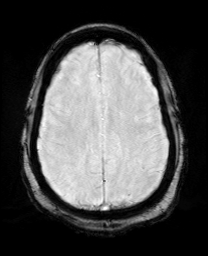
[im 60/60]
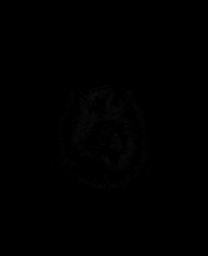

[Series 13: pha_images · axial · 3.0mm · 0.90mm/px · z∈[-118,+51]mm · 4 of 59 slices shown]
[im 1/59]
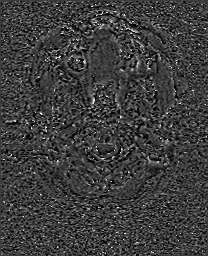
[im 20/59]
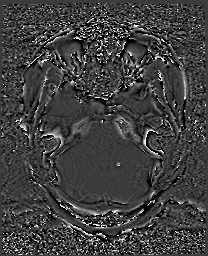
[im 39/59]
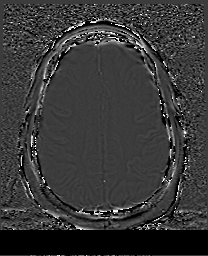
[im 59/59]
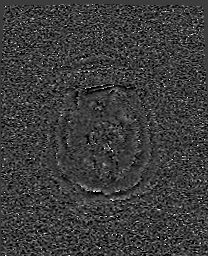

[Series 14: swi_images · axial · 3.0mm · 0.90mm/px · z∈[-121,+51]mm · 4 of 60 slices shown]
[im 1/60]
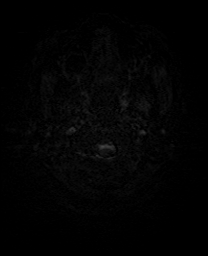
[im 20/60]
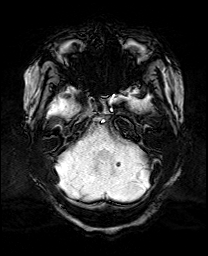
[im 40/60]
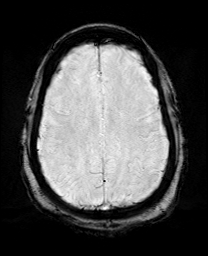
[im 60/60]
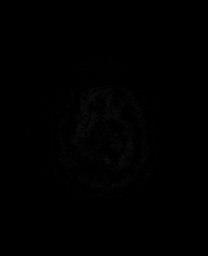

[Series 17: T2 post-contrast · coronal · 5.0mm · 0.72mm/px · 2 of 30 slices shown]
[im 1/30]
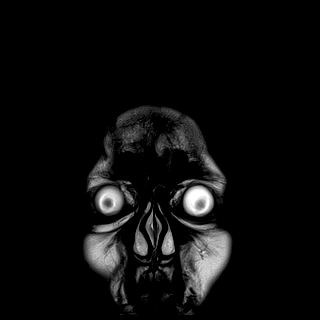
[im 30/30]
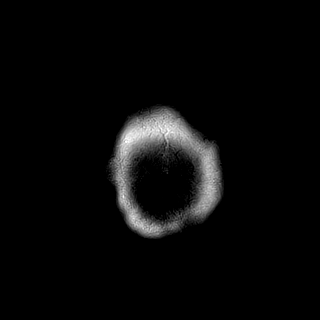

[Series 19: T1 post-contrast · coronal · 5.0mm · 0.34mm/px · 2 of 30 slices shown (1 of 2)]
[im 1/30]
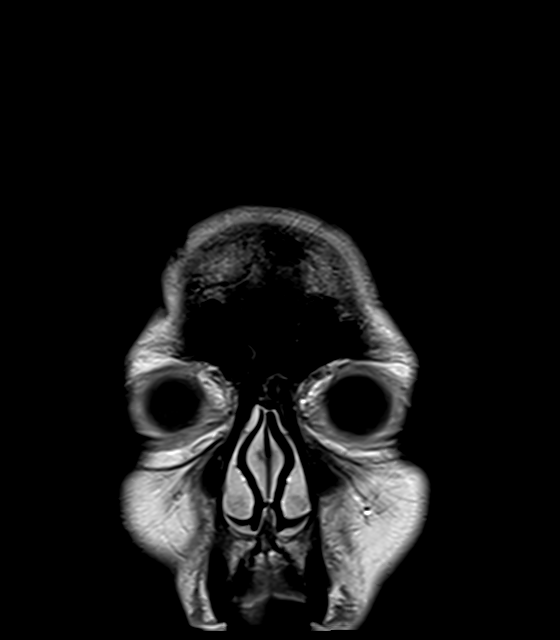
[im 30/30]
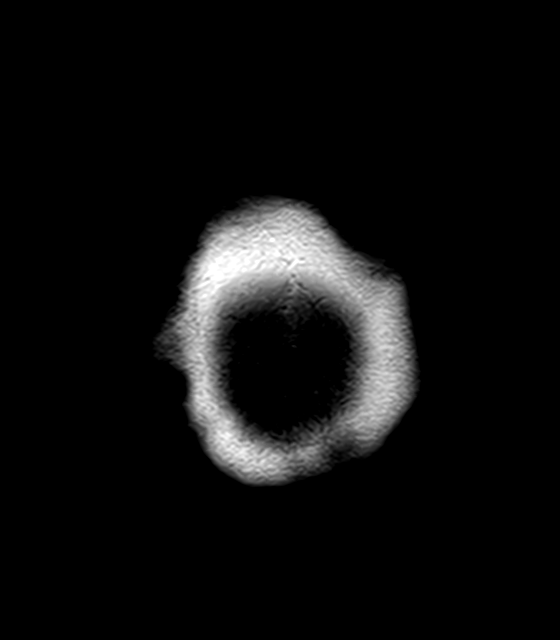

[Series 20: T1 post-contrast · sagittal · 5.0mm · 0.72mm/px · 2 of 25 slices shown (2 of 2)]
[im 1/25]
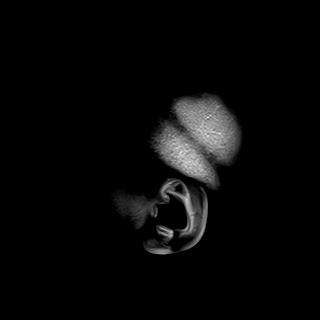
[im 25/25]
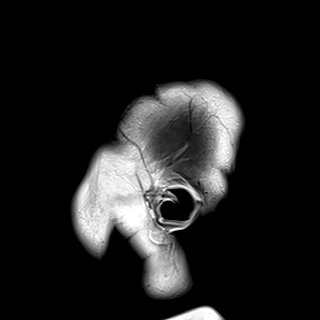

[40 of 48 positions shown; findings below may reference images not displayed]

FINDINGS: Brain: No hemorrhage, hydrocephalus, or infarct. Negative for mass
or collection.

Vascular: Interval placement of right-sided distal transverse to
sigmoid sinus stent with presumed patency on both sides given robust
flow before and after the stent. Dilated serpiginous vessel at the
upper vermis and left upper cerebellum with 3.5 mm maximal diameter
of the vessel near the blind-ending portion which contains chronic
calcification or blood products.

Skull and upper cervical spine: Normal marrow signal

Sinuses/Orbits: Negative
IMPRESSION: 1. No complicating features of interval right-sided transverse
sigmoid stent placement.
2. Unchanged appearance of the small vascular lesion at the superior
cerebellum with maximal vessel dimension of 3.5 mm diameter.

## 2022-01-16 MED ORDER — GADOBUTROL 1 MMOL/ML IV SOLN
10.0000 mL | Freq: Once | INTRAVENOUS | Status: AC | PRN
  Administered 2022-01-16: 10 mL via INTRAVENOUS

## 2022-01-26 ENCOUNTER — Ambulatory Visit
Admission: RE | Admit: 2022-01-26 | Discharge: 2022-01-26 | Disposition: A | Discharge status: Home / Self Care | Payer: No Typology Code available for payment source | Source: Ambulatory Visit | Attending: Nurse Practitioner | Admitting: Nurse Practitioner

## 2022-01-26 DIAGNOSIS — Z1231 Encounter for screening mammogram for malignant neoplasm of breast: Secondary | ICD-10-CM

## 2022-01-26 IMAGING — MG MM DIGITAL SCREENING BILAT W/ TOMO AND CAD
8 series · 8 of 24 positions shown · non-contrast
Comparison: Previous exam(s).

CLINICAL DATA: Screening.

EXAM:
DIGITAL SCREENING BILATERAL MAMMOGRAM WITH TOMOSYNTHESIS AND CAD
TECHNIQUE: Bilateral screening digital craniocaudal and mediolateral oblique
mammograms were obtained. Bilateral screening digital breast
tomosynthesis was performed. The images were evaluated with
computer-aided detection.

[R CC synth-2D]
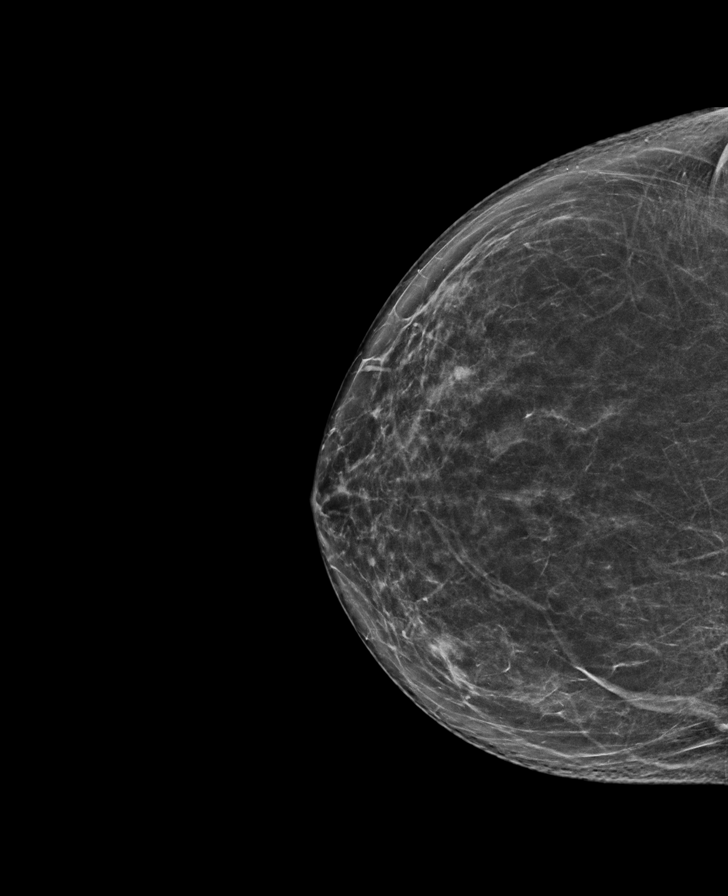

[L MLO synth-2D]
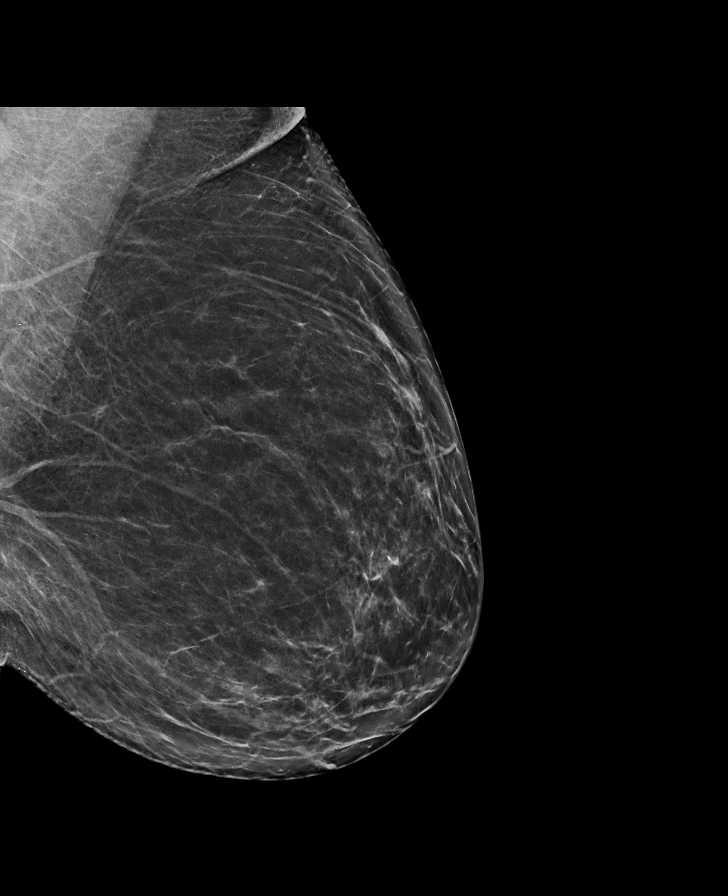

[L CC synth-2D]
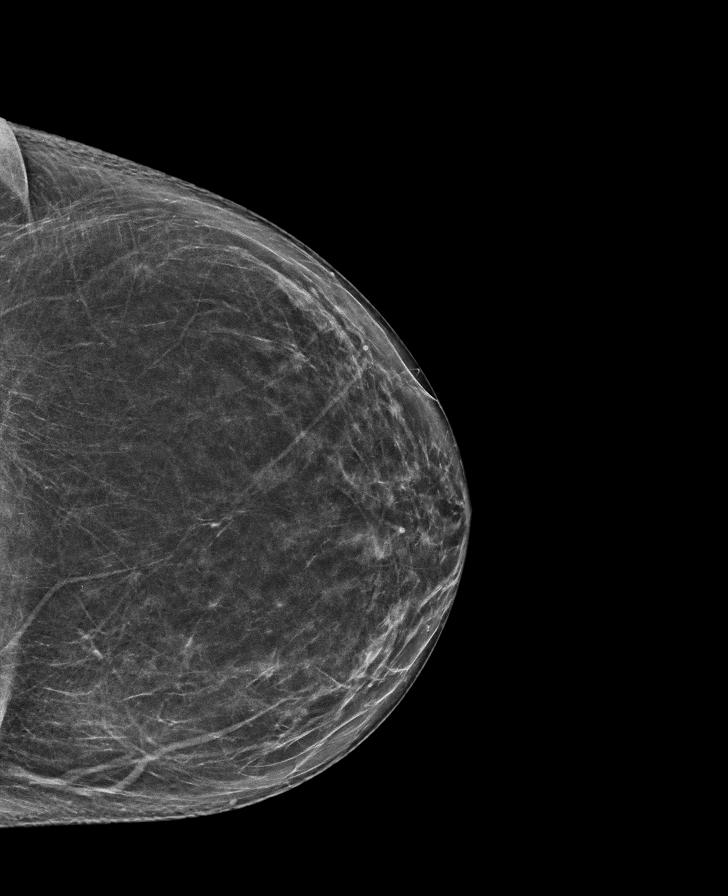

[R MLO synth-2D]
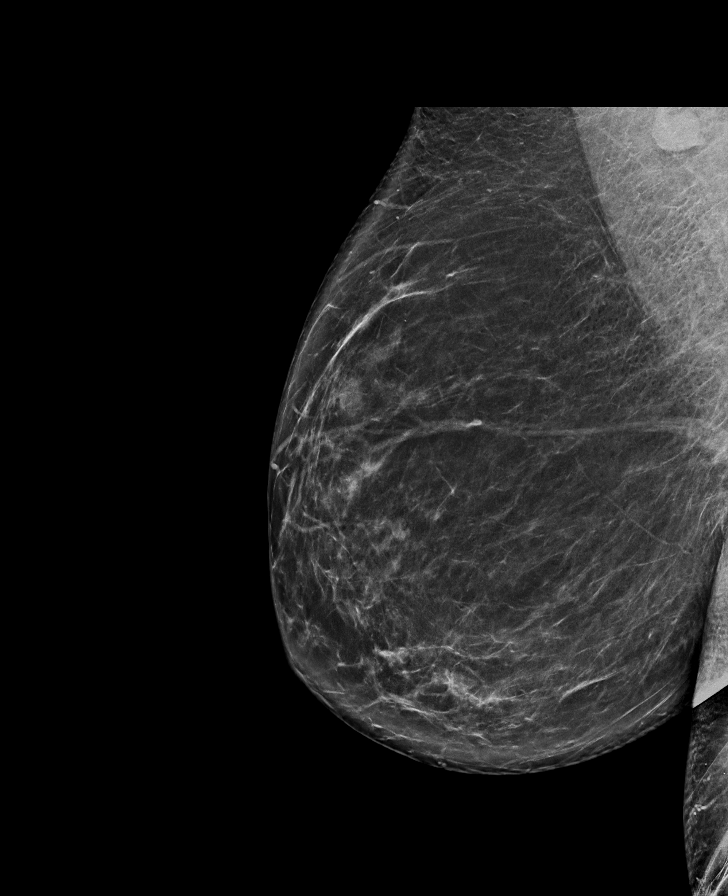

[R MLO tomo · tomo slice 44/87.0]
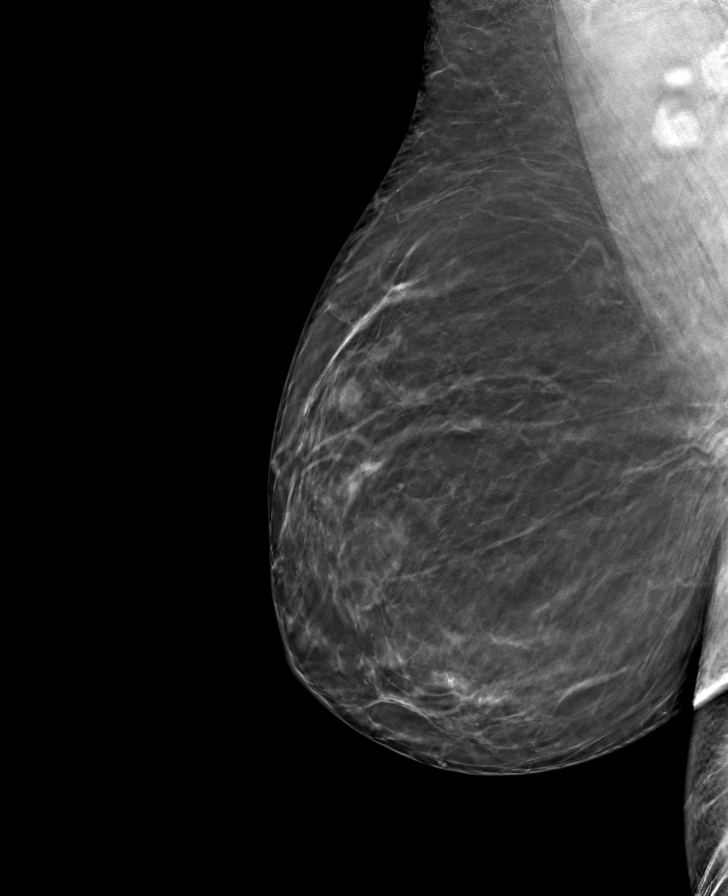

[L CC tomo · tomo slice 39/77.0]
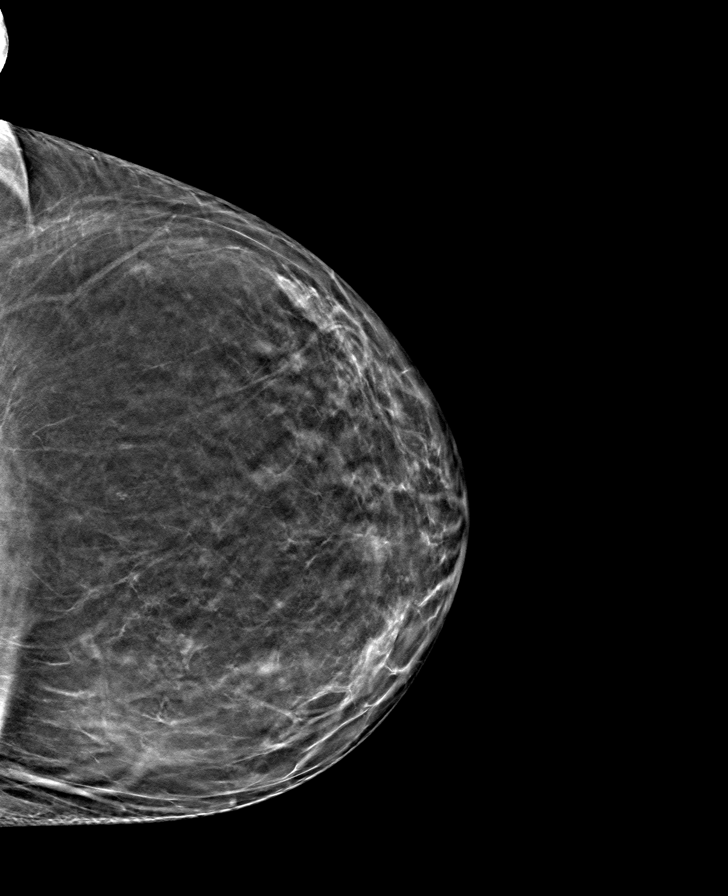

[L MLO tomo · tomo slice 44/87.0]
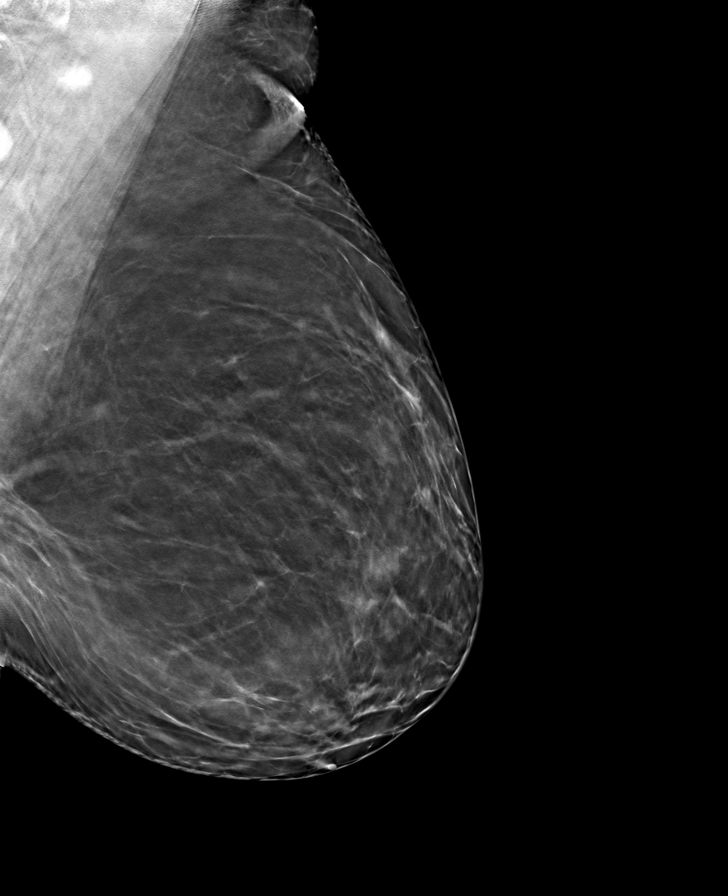

[R CC tomo · tomo slice 41/81.0]
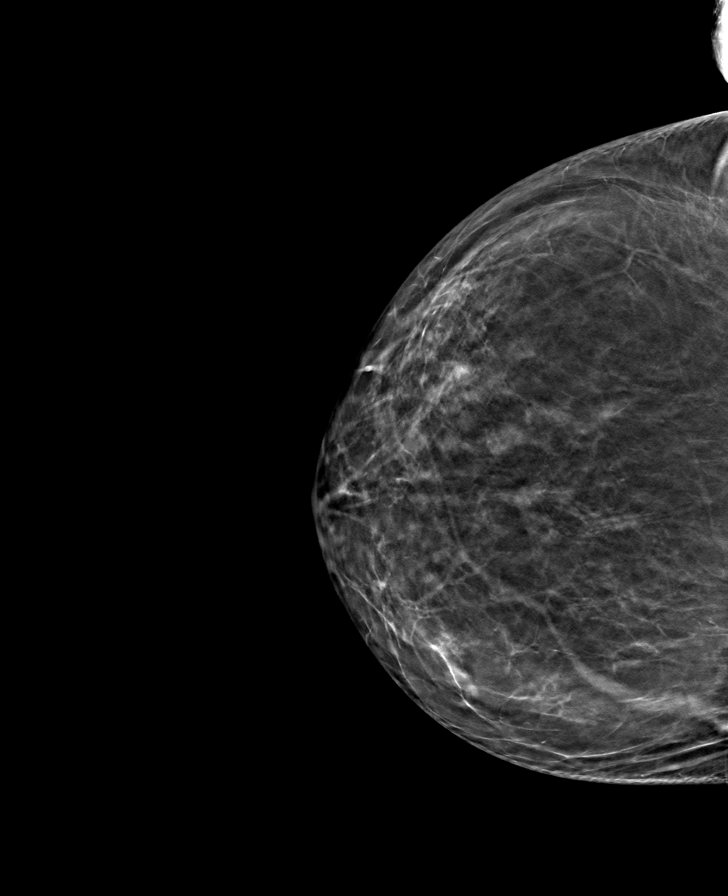

[8 of 24 positions shown; findings below may reference images not displayed]

ACR Breast Density Category b: There are scattered areas of
fibroglandular density.
FINDINGS: In the right breast, a possible mass warrants further evaluation.
This possible mass is seen within the outer RIGHT breast, cc slice
56.

In the left breast, no findings suspicious for malignancy.
IMPRESSION: Further evaluation is suggested for possible mass in the right
breast.

RECOMMENDATION:
Ultrasound of the right breast. (Code:[IG])

The patient will be contacted regarding the findings, and additional
imaging will be scheduled.

BI-RADS CATEGORY  0: Incomplete. Need additional imaging evaluation
and/or prior mammograms for comparison.

## 2022-01-27 ENCOUNTER — Other Ambulatory Visit: Payer: Self-pay | Admitting: Nurse Practitioner

## 2022-01-27 DIAGNOSIS — R928 Other abnormal and inconclusive findings on diagnostic imaging of breast: Secondary | ICD-10-CM

## 2022-01-30 ENCOUNTER — Other Ambulatory Visit: Payer: Self-pay | Admitting: Nurse Practitioner

## 2022-01-30 DIAGNOSIS — F419 Anxiety disorder, unspecified: Secondary | ICD-10-CM

## 2022-01-31 ENCOUNTER — Telehealth: Payer: Self-pay | Admitting: Neurology

## 2022-01-31 ENCOUNTER — Other Ambulatory Visit (HOSPITAL_COMMUNITY)
Admission: RE | Admit: 2022-01-31 | Discharge: 2022-01-31 | Disposition: A | Discharge status: Home / Self Care | Payer: No Typology Code available for payment source | Source: Ambulatory Visit | Attending: Family Medicine | Admitting: Family Medicine

## 2022-01-31 ENCOUNTER — Encounter: Payer: Self-pay | Admitting: Obstetrics

## 2022-01-31 ENCOUNTER — Ambulatory Visit (INDEPENDENT_AMBULATORY_CARE_PROVIDER_SITE_OTHER): Payer: No Typology Code available for payment source | Admitting: Obstetrics

## 2022-01-31 VITALS — BP 121/76 | HR 80 | Ht 66.0 in | Wt 216.0 lb

## 2022-01-31 DIAGNOSIS — N898 Other specified noninflammatory disorders of vagina: Secondary | ICD-10-CM | POA: Diagnosis not present

## 2022-01-31 DIAGNOSIS — E669 Obesity, unspecified: Secondary | ICD-10-CM

## 2022-01-31 DIAGNOSIS — B3731 Acute candidiasis of vulva and vagina: Secondary | ICD-10-CM | POA: Insufficient documentation

## 2022-01-31 DIAGNOSIS — Z113 Encounter for screening for infections with a predominantly sexual mode of transmission: Secondary | ICD-10-CM | POA: Insufficient documentation

## 2022-01-31 DIAGNOSIS — Z01419 Encounter for gynecological examination (general) (routine) without abnormal findings: Secondary | ICD-10-CM | POA: Diagnosis present

## 2022-01-31 DIAGNOSIS — B9689 Other specified bacterial agents as the cause of diseases classified elsewhere: Secondary | ICD-10-CM

## 2022-01-31 DIAGNOSIS — N760 Acute vaginitis: Secondary | ICD-10-CM | POA: Diagnosis not present

## 2022-01-31 MED ORDER — METRONIDAZOLE 500 MG PO TABS: 500.0000 mg | ORAL_TABLET | Freq: Two times a day (BID) | ORAL | 2 refills | Status: DC

## 2022-01-31 NOTE — Progress Notes (Signed)
New GYN is in the office to establish care Hx of partial hysterectomy in 2012 Last mammogram 01/26/22 Pt reports that she was advised of mass in R breast so she is following up on Thursday

## 2022-01-31 NOTE — Telephone Encounter (Signed)
Pt stated the paperwork from January did not get to them, she would like for it to be refaxed.

## 2022-01-31 NOTE — Telephone Encounter (Signed)
Paperwork refaxed.

## 2022-01-31 NOTE — Progress Notes (Addendum)
Subjective:        Virginia Douglas is a 46 y.o. female here for a routine exam.  Current complaints: Vaginal discharge.  She is s/p supracervical hysterectomy in 2012 for fibroids.  Personal health questionnaire:  Is patient Ashkenazi Jewish, have a family history of breast and/or ovarian cancer: yes Is there a family history of uterine cancer diagnosed at age < 52, gastrointestinal cancer, urinary tract cancer, family member who is a Field seismologist syndrome-associated carrier: yes Is the patient overweight and hypertensive, family history of diabetes, personal history of gestational diabetes, preeclampsia or PCOS: yes Is patient over 61, have PCOS,  family history of premature CHD under age 32, diabetes, smoke, have hypertension or peripheral artery disease:  no At any time, has a partner hit, kicked or otherwise hurt or frightened you?: no Over the past 2 weeks, have you felt down, depressed or hopeless?: no Over the past 2 weeks, have you felt little interest or pleasure in doing things?:no   Gynecologic History No LMP recorded. Patient has had a hysterectomy. Contraception: status post hysterectomy, supracervical Last Pap: 2017. Results were: normal Last mammogram: 2023. Results were: abnormal  Obstetric History OB History  Gravida Para Term Preterm AB Living  2         2  SAB IAB Ectopic Multiple Live Births          2    # Outcome Date GA Lbr Len/2nd Weight Sex Delivery Anes PTL Lv  2 Gravida 2000     Vag-Spont   LIV  1 Gravida 1998     Vag-Spont   LIV    Past Medical History:  Diagnosis Date   ADHD (attention deficit hyperactivity disorder)    Anemia    iron deficiency   as a teenager   Anxiety    Arthritis    Diabetes ([place])    Edema    GERD (gastroesophageal reflux disease)    Hypertension    patient denies- patient stated that the PCP said it was for heart prevention.   Migraine headache     Past Surgical History:  Procedure Laterality Date   ABDOMINAL  HYSTERECTOMY  2012   CHOLECYSTECTOMY     COLONOSCOPY  12/28/2020   polyps   ESOPHAGEAL DILATION     IR ANGIO INTRA EXTRACRAN SEL COM CAROTID INNOMINATE BILAT MOD SED  05/31/2021   IR ANGIO INTRA EXTRACRAN SEL COM CAROTID INNOMINATE BILAT MOD SED  06/27/2021   IR ANGIO INTRA EXTRACRAN SEL COM CAROTID INNOMINATE UNI R MOD SED  11/21/2021   IR ANGIO VERTEBRAL SEL VERTEBRAL UNI R MOD SED  05/31/2021   IR CT HEAD LTD  06/27/2021   IR CT HEAD LTD  11/21/2021   IR INTRA CRAN STENT  06/27/2021   IR INTRA CRAN STENT  11/21/2021   IR RADIOLOGIST EVAL & MGMT  06/03/2021   IR RADIOLOGIST EVAL & MGMT  07/13/2021   IR RADIOLOGIST EVAL & MGMT  08/27/2021   IR RADIOLOGIST EVAL & MGMT  10/30/2021   IR RADIOLOGIST EVAL & MGMT  12/06/2021   IR US GUIDE VASC ACCESS RIGHT  05/31/2021   IR US GUIDE VASC ACCESS RIGHT  06/27/2021   IR US GUIDE VASC ACCESS RIGHT  11/21/2021   OVARIAN CYST REMOVAL     RADIOLOGY WITH ANESTHESIA N/A 06/27/2021   Procedure: STENTING;  Surgeon: Luanne Bras, MD;  Location: [facility];  Service: Radiology;  Laterality: N/A;   RADIOLOGY WITH ANESTHESIA N/A 11/21/2021  Procedure: IR WITH ANESTHESIA STENTING;  Surgeon: Luanne Bras, MD;  Location: [facility];  Service: Radiology;  Laterality: N/A;     Current Outpatient Medications:    acetaZOLAMIDE (DIAMOX) 250 MG tablet, TAKE 1 TABLET BY MOUTH TWICE A DAY (Patient taking differently: Take 500 mg by mouth daily.), Disp: 60 tablet, Rfl: 2   ADDERALL XR 30 MG 24 hr capsule, Take 30 mg by mouth daily., Disp: , Rfl:    AIMOVIG 140 MG/ML SOAJ, INJECT 140 MG INTO THE SKIN EVERY 30 DAYS, Disp: 1 mL, Rfl: 2   aspirin EC 81 MG tablet, Take 81 mg by mouth daily. Swallow whole., Disp: , Rfl:    atorvastatin (LIPITOR) 10 MG tablet, TAKE 1 TABLET BY MOUTH EVERY DAY, Disp: 90 tablet, Rfl: 1   Blood Glucose Monitoring Suppl (ONETOUCH VERIO IQ SYSTEM) w/Device KIT, by Does not apply route. Use to check blood sugars twice, Disp: , Rfl:     buPROPion (WELLBUTRIN XL) 150 MG 24 hr tablet, TAKE 1 TABLET BY MOUTH EVERY DAY IN THE MORNING, Disp: 90 tablet, Rfl: 1   cetirizine (ZYRTEC) 10 MG tablet, Take 10 mg by mouth daily as needed for allergies., Disp: , Rfl:    clonazePAM (KLONOPIN) 0.5 MG tablet, TAKE 1 TABLET BY MOUTH THREE TIMES A DAY AS NEEDED FOR ANXIETY (Patient taking differently: Take 0.5 mg by mouth 3 (three) times a week.), Disp: 30 tablet, Rfl: 1   clopidogrel (PLAVIX) 75 MG tablet, Take 1 tablet (75 mg total) by mouth daily., Disp: 30 tablet, Rfl: 3   DULoxetine (CYMBALTA) 60 MG capsule, TAKE 1 CAPSULE BY MOUTH EVERY DAY, Disp: 90 capsule, Rfl: 1   famotidine (PEPCID) 20 MG tablet, Take 20 mg by mouth daily., Disp: , Rfl:    glucose blood test strip, 1 each by Other route as needed for other. Insert 1 by subcutaneous route 2 times every day check blood sugar before breakfast and dinner, Disp: , Rfl:    hydrOXYzine (VISTARIL) 50 MG capsule, TAKE 1-2 CAPSULES BY MOUTH AS NEEDED FOR SLEEP AT BEDTIME, Disp: 180 capsule, Rfl: 1   Insulin Pen Needle (PEN NEEDLES) 32G X 4 MM MISC, 1 each by Does not apply route once a week., Disp: 30 each, Rfl: 3   linaclotide (LINZESS) 72 MCG capsule, Take 72 mcg by mouth daily as needed (Constipation)., Disp: , Rfl:    lisinopril-hydrochlorothiazide (ZESTORETIC) 10-12.5 MG tablet, TAKE 1 TABLET BY MOUTH EVERY DAY, Disp: 90 tablet, Rfl: 1   metroNIDAZOLE (FLAGYL) 500 MG tablet, Take 1 tablet (500 mg total) by mouth 2 (two) times daily., Disp: 14 tablet, Rfl: 2   omeprazole (PRILOSEC) 40 MG capsule, Take 40 mg by mouth daily., Disp: , Rfl:    ondansetron (ZOFRAN-ODT) 4 MG disintegrating tablet, 4 mg daily as needed for nausea or vomiting., Disp: , Rfl:    OZEMPIC, 2 MG/DOSE, 8 MG/3ML SOPN, INJECT 2 MG INTO THE SKIN ONCE A WEEK., Disp: 3 mL, Rfl: 8   phentermine 15 MG capsule, TAKE 1 CAPSULE BY MOUTH EVERY DAY IN THE MORNING, Disp: 30 capsule, Rfl: 1   Polyethyl Glycol-Propyl Glycol (SYSTANE) 0.4-0.3  % SOLN, Place 1 drop into both eyes daily., Disp: , Rfl:    saccharomyces boulardii (FLORASTOR) 250 MG capsule, Take 250 mg by mouth daily. gummy, Disp: , Rfl:    tiZANidine (ZANAFLEX) 4 MG tablet, Take 4 mg by mouth daily as needed for muscle spasms., Disp: , Rfl:    topiramate (TOPAMAX) 50 MG  tablet, TAKE 2 AND 1/2 TABLETS (125 MG TOTAL) BY MOUTH AT BEDTIME. (Patient taking differently: Take 100 mg by mouth at bedtime.), Disp: 75 tablet, Rfl: 5   Vitamin D, Ergocalciferol, (DRISDOL) 1.25 MG (50000 UNIT) CAPS capsule, TAKE 1 CAPSULE BY MOUTH EVERY 7 DAYS **NOT COVERED**, Disp: 12 capsule, Rfl: 0   chlorhexidine (PERIDEX) 0.12 % solution, 5 mLs by Mouth Rinse route daily. (Patient not taking: Reported on 01/31/2022), Disp: , Rfl:  No Known Allergies  Social History   Tobacco Use   Smoking status: Former    Packs/day: 0.50    Years: 25.00    Pack years: 12.50    Types: Cigarettes    Start date: 03/24/2019   Smokeless tobacco: Never   Tobacco comments:    06/24/21- quit over a year ago  Substance Use Topics   Alcohol use: No    Alcohol/week: 0.0 standard drinks    Family History  Problem Relation Age of Onset   Diabetes Mother    Hypertension Mother    Diabetes Father    Hypertension Father    Kidney failure Father    Hypertension Brother    Cancer Maternal Grandmother    Diabetes Maternal Grandmother    Pancreatic cancer Maternal Grandmother    Diabetes Paternal Grandmother    Breast cancer Paternal Grandmother    Stroke Paternal Grandfather    Healthy Son    Healthy Son    Cancer Maternal Aunt        breast   Breast cancer Maternal Aunt       Review of Systems  Constitutional: negative for fatigue and weight loss Respiratory: negative for cough and wheezing Cardiovascular: negative for chest pain, fatigue and palpitations Gastrointestinal: negative for abdominal pain and change in bowel habits Musculoskeletal:negative for myalgias Neurological: negative for gait  problems and tremors Behavioral/Psych: negative for abusive relationship, depression Endocrine: negative for temperature intolerance    Genitourinary: positive for vaginal discharge.  negative for abnormal menstrual periods, genital lesions, hot flashes, sexual problems  Integument/breast: negative for breast lump, breast tenderness, nipple discharge and skin lesion(s)    Objective:       BP 121/76   Pulse 80   Ht _0  (1.676 m)   Wt 216 lb (98 kg)   BMI 34.86 kg/m  General:   Alert and no distress  Skin:   no rash or abnormalities  Lungs:   clear to auscultation bilaterally  Heart:   regular rate and rhythm, S1, S2 normal, no murmur, click, rub or gallop  Breasts:   normal without suspicious masses, skin or nipple changes or axillary nodes  Abdomen:  normal findings: no organomegaly, soft, non-tender and no hernia  Pelvis:  External genitalia: normal general appearance Urinary system: urethral meatus normal and bladder without fullness, nontender Vaginal: normal without tenderness, induration or masses Cervix: normal appearance Adnexa: normal bimanual exam Uterus: absent   Lab Review Urine pregnancy test Labs reviewed yes Radiologic studies reviewed yes  I have spent a total of 20 minutes of face-to-face time, excluding clinical staff time, reviewing notes and preparing to see patient, ordering tests and/or medications, and counseling the patient.   Assessment:    1. Encounter for gynecological examination with Papanicolaou smear of cervix Rx: - Cytology - PAP( [place])  2. Vaginal discharge Rx: - Cervicovaginal ancillary only( [place])  3. Screening for STD (sexually transmitted disease) Rx: - HIV antibody (with reflex) - Hepatitis C Antibody - [facility]    Plan:  Education reviewed: calcium supplements, depression evaluation, low fat, low cholesterol diet, safe sex/STD prevention, self breast exams, and weight bearing exercise. Follow up in: 1  year. Colonoscopy ordered     Orders Placed This Encounter  Procedures   HIV antibody (with reflex)   Hepatitis C Antibody   [facility]     Shelly Bombard, MD 01/31/2022 9:25 AM

## 2022-01-31 NOTE — Addendum Note (Signed)
Addended by: Shelly Bombard on: 01/31/2022 09:39 AM   Modules accepted: Orders

## 2022-02-01 LAB — CERVICOVAGINAL ANCILLARY ONLY
Bacterial Vaginitis (gardnerella): POSITIVE — AB
Candida Glabrata: POSITIVE — AB
Candida Vaginitis: NEGATIVE
Chlamydia: NEGATIVE
Comment: NEGATIVE
Comment: NEGATIVE
Comment: NEGATIVE
Comment: NEGATIVE
Comment: NEGATIVE
Comment: NORMAL
Neisseria Gonorrhea: NEGATIVE
Trichomonas: NEGATIVE

## 2022-02-01 LAB — RPR: RPR Ser Ql: NONREACTIVE

## 2022-02-01 LAB — HEPATITIS C ANTIBODY: Hep C Virus Ab: NONREACTIVE

## 2022-02-01 LAB — HIV ANTIBODY (ROUTINE TESTING W REFLEX): HIV Screen 4th Generation wRfx: NONREACTIVE

## 2022-02-02 ENCOUNTER — Other Ambulatory Visit: Payer: Self-pay | Admitting: Obstetrics

## 2022-02-02 ENCOUNTER — Ambulatory Visit
Admission: RE | Admit: 2022-02-02 | Discharge: 2022-02-02 | Disposition: A | Discharge status: Home / Self Care | Payer: No Typology Code available for payment source | Source: Ambulatory Visit | Attending: Nurse Practitioner | Admitting: Nurse Practitioner

## 2022-02-02 DIAGNOSIS — B379 Candidiasis, unspecified: Secondary | ICD-10-CM

## 2022-02-02 DIAGNOSIS — R928 Other abnormal and inconclusive findings on diagnostic imaging of breast: Secondary | ICD-10-CM

## 2022-02-02 LAB — CYTOLOGY - PAP
Comment: NEGATIVE
Diagnosis: NEGATIVE
High risk HPV: NEGATIVE

## 2022-02-02 IMAGING — US US BREAST*R* LIMITED INC AXILLA
1 series · 9 of 9 positions shown · non-contrast
Comparison: [facility]

CLINICAL DATA: Patient recalled from screening for right breast
mass

EXAM:
ULTRASOUND OF THE RIGHT BREAST

[Series 1: us breast*right* limited inc axilla · 0.06mm/px · 9 of 9 slices shown]
[im 1/9]
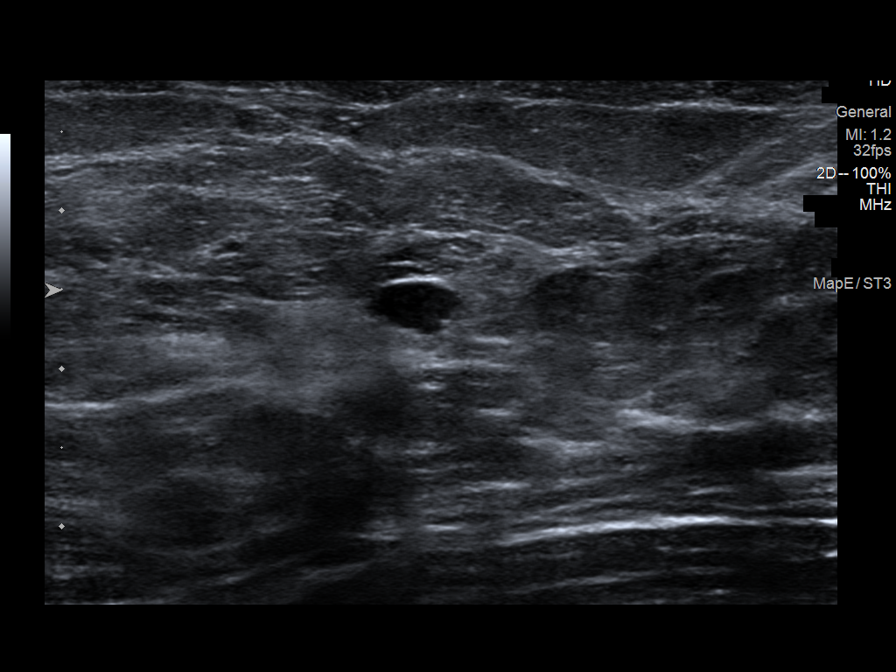
[im 2/9]
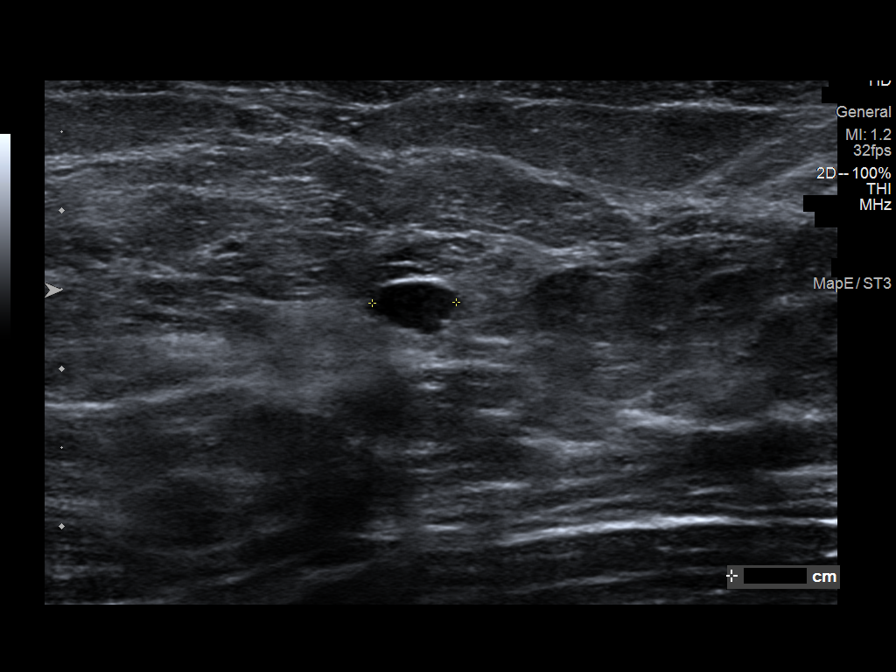
[im 3/9]
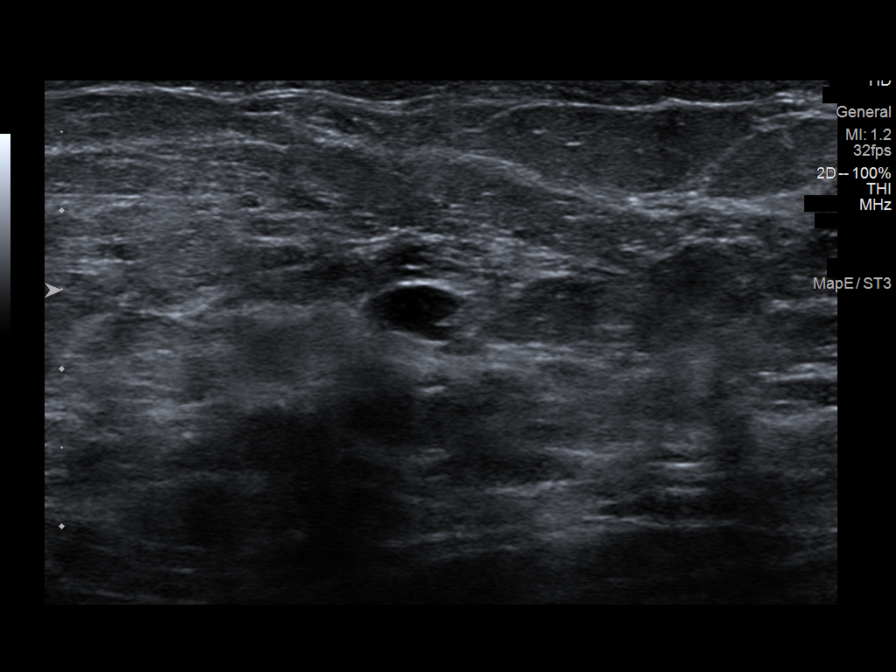
[im 4/9]
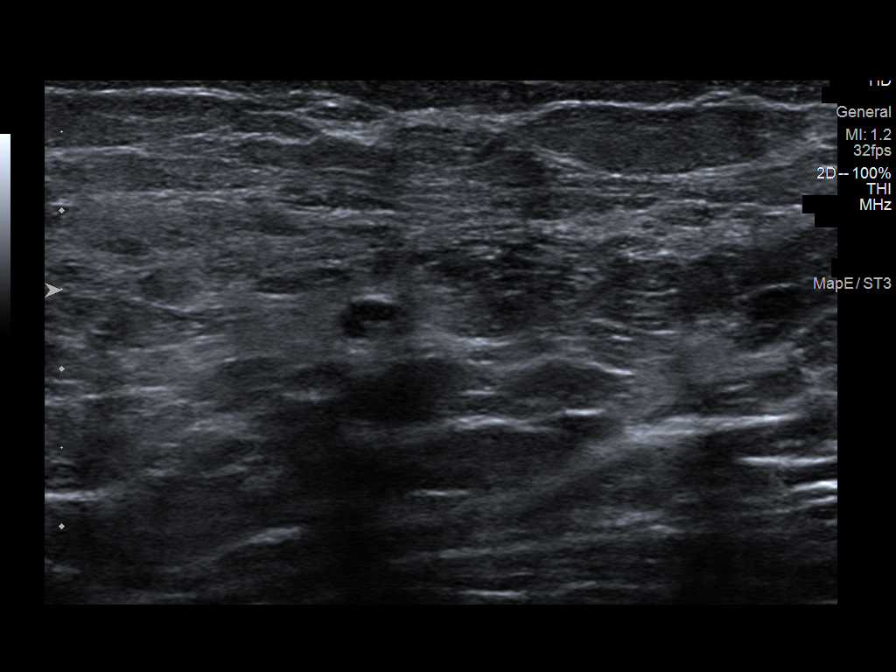
[im 5/9]
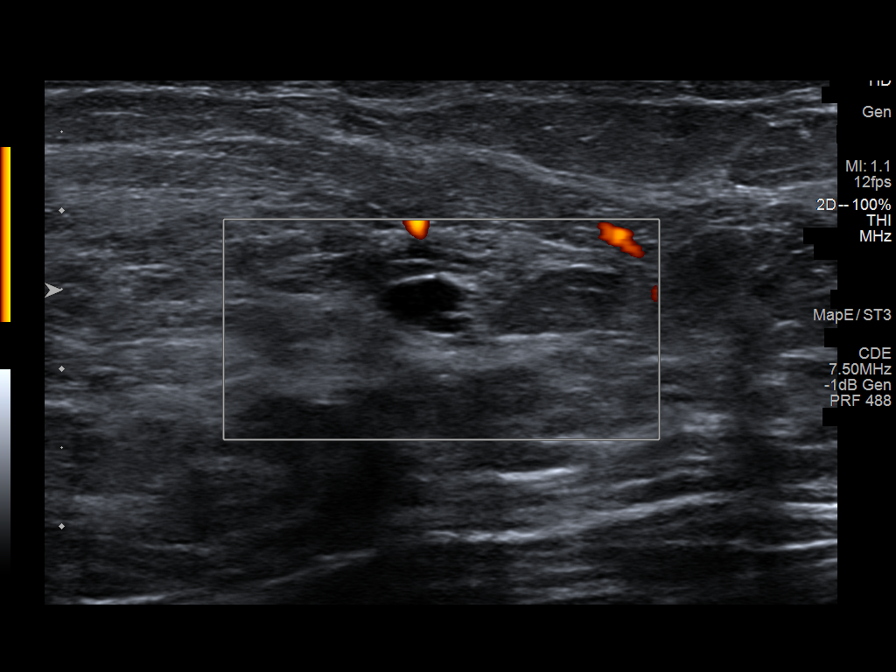
[im 6/9]
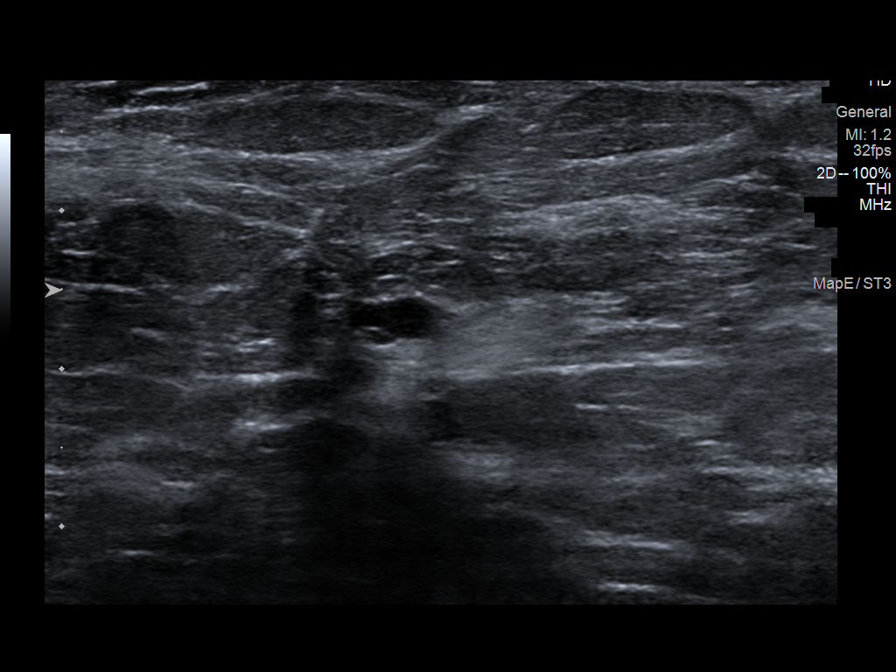
[im 7/9]
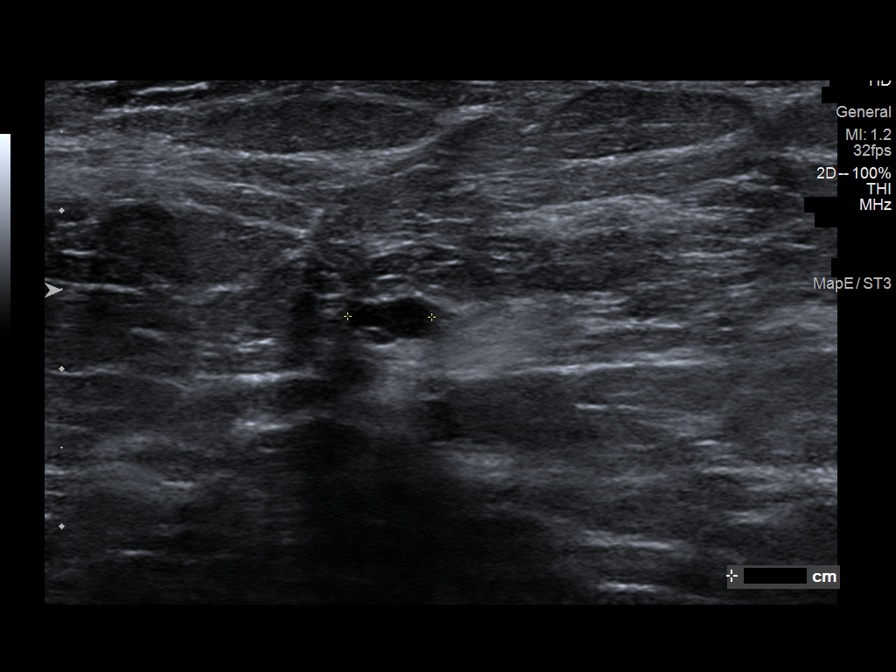
[im 8/9]
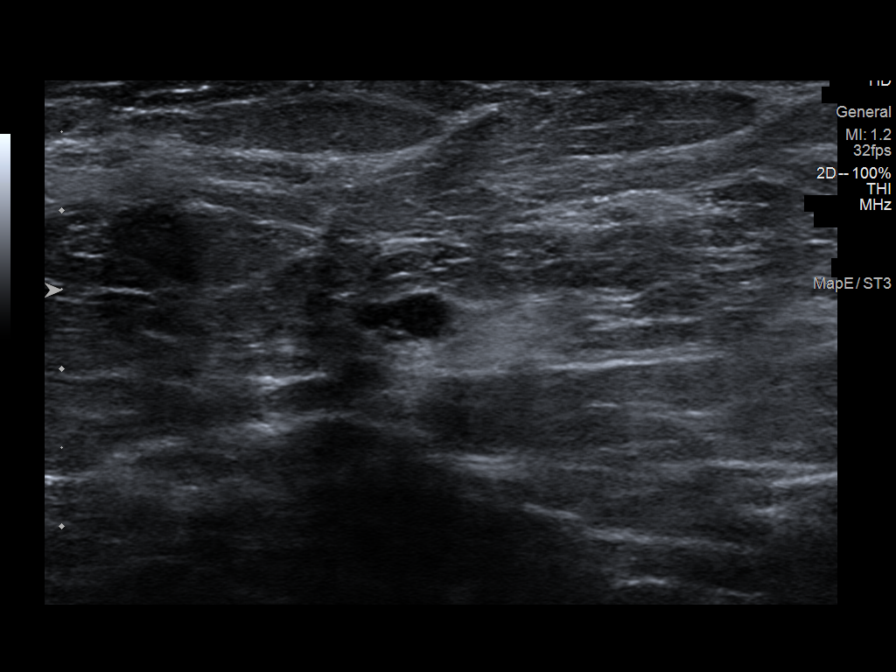
[im 9/9]
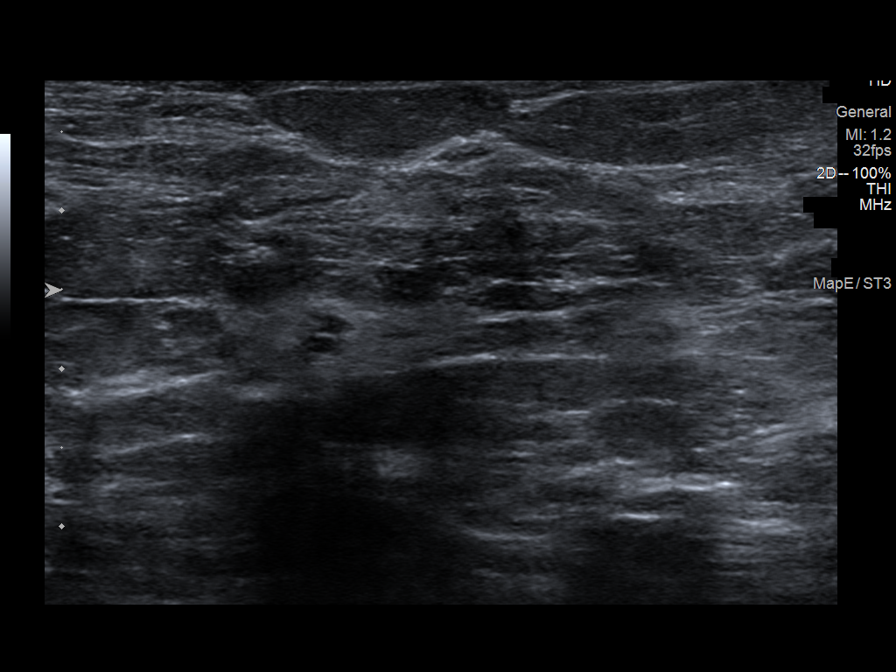

[9 of 9 positions shown; findings below may reference images not displayed]

FINDINGS: Targeted ultrasound is performed, showing a 5 x 5 mm cyst right
breast 9 o'clock position 4 cm from nipple.
IMPRESSION: Right breast cyst.

RECOMMENDATION:
Screening mammogram in one year.(Code:[3W])

I have discussed the findings and recommendations with the patient.
If applicable, a reminder letter will be sent to the patient
regarding the next appointment.

BI-RADS CATEGORY  2: Benign.

## 2022-02-02 MED ORDER — AZO BORIC ACID 600 MG VA SUPP: 1.0000 | Freq: Every day | VAGINAL | 0 refills | Status: DC

## 2022-02-02 MED ORDER — AZO BORIC ACID 600 MG VA SUPP
1.0000 | Freq: Every day | VAGINAL | 0 refills | Status: DC
Start: 2022-02-02 — End: 2022-10-15

## 2022-02-09 ENCOUNTER — Telehealth (HOSPITAL_COMMUNITY): Payer: Self-pay

## 2022-02-09 ENCOUNTER — Other Ambulatory Visit (HOSPITAL_COMMUNITY): Payer: Self-pay | Admitting: Physician Assistant

## 2022-02-09 MED ORDER — CLOPIDOGREL BISULFATE 75 MG PO TABS: 75.0000 mg | ORAL_TABLET | Freq: Every day | ORAL | 2 refills | Status: DC

## 2022-02-09 NOTE — Telephone Encounter (Signed)
Pt called for plavix refill. I've sent a message to [facility] to please call in. AW

## 2022-02-12 ENCOUNTER — Encounter: Payer: Self-pay | Admitting: Nurse Practitioner

## 2022-02-27 ENCOUNTER — Telehealth (HOSPITAL_COMMUNITY): Payer: Self-pay

## 2022-02-27 NOTE — Telephone Encounter (Signed)
 Received a request from [facility] for angiogram images. Pushed all imaging to [facility] via powershare. AW

## 2022-02-28 ENCOUNTER — Other Ambulatory Visit: Payer: Self-pay | Admitting: Nurse Practitioner

## 2022-03-04 ENCOUNTER — Other Ambulatory Visit: Payer: Self-pay | Admitting: Neurology

## 2022-03-09 NOTE — Progress Notes (Signed)
Office Visit Note  Patient: Virginia Douglas             Date of Birth: 11/13/1975           MRN: [id number]             PCP: [name], FNP Referring: [name], FNP Visit Date: 03/23/2022 Occupation: '@GUAROCC'$ @  Subjective:  Increased joint pain  History of Present Illness: Virginia Douglas is a 46 y.o. female with history of raynaud's disease, myofascial pain, and DDD.  Patient presents today with several new concerns.  She continues to experience intermittent Raynaud's phenomenon in her fingertips and toes.  She denies any digital ulcerations or skin tightness or thickening.  She has been experiencing intermittent rashes with no identifiable trigger.  She has also noticed petechiae intermittently.  She remains on Plavix and aspirin as recommended due to having a stent in her brain.  She has been experiencing intermittent oral and nasal ulcerations.  She also has ongoing sicca symptoms.  She is concerned about possible Sjogren's syndrome.  She states she is that she has had increased pain and intermittent swelling in both hands and both feet.  She also has ongoing discomfort in her lower back and both hips.  She is no longer following up with pain management.  She continues to have intermittent myalgias and muscle tenderness due to myofascial pain syndrome.  She is taking tizanidine 4 mg daily as needed for muscle spasms.     Activities of Daily Living:  Patient reports morning stiffness for 30 minutes.   Patient Reports nocturnal pain.  Difficulty dressing/grooming: Reports Difficulty climbing stairs: Reports Difficulty getting out of chair: Reports Difficulty using hands for taps, buttons, cutlery, and/or writing: Reports  Review of Systems  Constitutional:  Positive for fatigue.  HENT:  Positive for mouth sores and mouth dryness.   Eyes:  Positive for dryness.  Respiratory:  Positive for shortness of breath.   Cardiovascular:  Positive for chest pain and  palpitations.  Gastrointestinal:  Positive for constipation. Negative for blood in stool and diarrhea.  Endocrine: Negative for increased urination.  Genitourinary:  Negative for involuntary urination.  Musculoskeletal:  Positive for joint pain, joint pain, joint swelling, myalgias, muscle weakness, morning stiffness, muscle tenderness and myalgias.  Skin:  Positive for color change. Negative for rash, hair loss and sensitivity to sunlight.  Allergic/Immunologic: Positive for susceptible to infections.  Neurological:  Positive for dizziness and headaches.  Hematological:  Negative for swollen glands.  Psychiatric/Behavioral:  Positive for depressed mood and sleep disturbance. The patient is nervous/anxious.     PMFS History:  Patient Active Problem List   Diagnosis Date Noted   Cerebral venous thrombosis of sigmoid sinus 11/21/2021   Pulsatile tinnitus of right ear 11/21/2021   Pulsatile tinnitus, bilateral 06/27/2021   AVM (arteriovenous malformation) brain 05/31/2021   DDD (degenerative disc disease), cervical 12/06/2020   DDD (degenerative disc disease), lumbar 12/06/2020   Gastroesophageal reflux disease with esophagitis without hemorrhage 12/06/2020   Increased intracranial pressure 12/06/2020   PTSD (post-traumatic stress disorder) 12/06/2020   Attention deficit hyperactivity disorder (ADHD), predominantly inattentive type 12/06/2020   Vitamin D deficiency 12/06/2020   Obesity (BMI 30-39.9) 03/15/2020   Sciatic nerve pain 12/10/2019   Acute pain of left shoulder 06/05/2019   Grief 06/05/2019   Migraine 06/05/2019   Type 2 diabetes mellitus without complication, without long-term current use of insulin ([place]) 04/07/2019   Depression 04/07/2019   Obesity due to excess calories  without serious comorbidity 04/07/2019   Left leg pain 04/07/2019   Anxiety 04/07/2019   Headache 07/06/2018   Diabetes ([place]) 12/08/2015   Essential hypertension 12/08/2015   Smoker 12/08/2015   LLQ  pain 12/08/2015   Paresthesia 09/16/2015   Urinary urgency 09/16/2015    Past Medical History:  Diagnosis Date   ADHD (attention deficit hyperactivity disorder)    Anemia    iron deficiency   as a teenager   Anxiety    Arthritis    Diabetes ([place])    Edema    GERD (gastroesophageal reflux disease)    Hypertension    patient denies- patient stated that the PCP said it was for heart prevention.   Migraine headache     Family History  Problem Relation Age of Onset   Diabetes Mother    Hypertension Mother    Diabetes Father    Hypertension Father    Kidney failure Father    Hypertension Brother    Cancer Maternal Grandmother    Diabetes Maternal Grandmother    Pancreatic cancer Maternal Grandmother    Diabetes Paternal Grandmother    Breast cancer Paternal Grandmother    Stroke Paternal Grandfather    Healthy Son    Healthy Son    Cancer Maternal Aunt        breast   Breast cancer Maternal Aunt    Past Surgical History:  Procedure Laterality Date   ABDOMINAL HYSTERECTOMY  2012   CHOLECYSTECTOMY     COLONOSCOPY  12/28/2020   polyps   ESOPHAGEAL DILATION     IR ANGIO INTRA EXTRACRAN SEL COM CAROTID INNOMINATE BILAT MOD SED  05/31/2021   IR ANGIO INTRA EXTRACRAN SEL COM CAROTID INNOMINATE BILAT MOD SED  06/27/2021   IR ANGIO INTRA EXTRACRAN SEL COM CAROTID INNOMINATE UNI R MOD SED  11/21/2021   IR ANGIO VERTEBRAL SEL VERTEBRAL UNI R MOD SED  05/31/2021   IR CT HEAD LTD  06/27/2021   IR CT HEAD LTD  11/21/2021   IR INTRA CRAN STENT  06/27/2021   IR INTRA CRAN STENT  11/21/2021   IR RADIOLOGIST EVAL & MGMT  06/03/2021   IR RADIOLOGIST EVAL & MGMT  07/13/2021   IR RADIOLOGIST EVAL & MGMT  08/27/2021   IR RADIOLOGIST EVAL & MGMT  10/30/2021   IR RADIOLOGIST EVAL & MGMT  12/06/2021   IR US GUIDE VASC ACCESS RIGHT  05/31/2021   IR US GUIDE VASC ACCESS RIGHT  06/27/2021   IR US GUIDE VASC ACCESS RIGHT  11/21/2021   OVARIAN CYST REMOVAL     RADIOLOGY WITH ANESTHESIA N/A  06/27/2021   Procedure: STENTING;  Surgeon: Luanne Bras, MD;  Location: [facility];  Service: Radiology;  Laterality: N/A;   RADIOLOGY WITH ANESTHESIA N/A 11/21/2021   Procedure: IR WITH ANESTHESIA STENTING;  Surgeon: Luanne Bras, MD;  Location: [facility];  Service: Radiology;  Laterality: N/A;   Social History   Social History Narrative   In relationship, 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 She lives with her 2 children in a one story house. She drinks one large cup of coffee a day and an occasional tea or soda. She walks daily.      One story home      Immunization History  Administered Date(s) Administered   HPV 9-valent 04/11/2019  Influenza Inj Mdck Quad Pf 05/18/2018   Influenza,inj,Quad PF,6+ Mos 05/20/2017, 05/14/2019, 06/02/2020   Influenza-Unspecified 05/11/2017, 05/15/2018, 05/08/2019, 07/13/2021   Janssen (J&J) SARS-COV-2 Vaccination 11/23/2019   PFIZER(Purple Top)SARS-COV-2 Vaccination 07/11/2020   Pneumococcal Polysaccharide-23 04/08/2019, 07/15/2019   Tdap 07/02/2017, 04/09/2019     Objective: Vital Signs: BP 115/78 (BP Location: Left Arm, Patient Position: Sitting, Cuff Size: Normal)   Pulse 86   Ht '5\' 6"'$  (1.676 m)   Wt 205 lb 12.8 oz (93.4 kg)   BMI 33.22 kg/m    Physical Exam Vitals and nursing note reviewed.  Constitutional:      Appearance: She is well-developed.  HENT:     Head: Normocephalic and atraumatic.  Eyes:     Conjunctiva/sclera: Conjunctivae normal.  Cardiovascular:     Rate and Rhythm: Normal rate and regular rhythm.     Heart sounds: Normal heart sounds.  Pulmonary:     Effort: Pulmonary effort is normal.     Breath sounds: Normal breath sounds.  Abdominal:     General: Bowel sounds are normal.     Palpations: Abdomen is soft.  Musculoskeletal:     Cervical back: Normal range of  motion.  Skin:    General: Skin is warm and dry.     Capillary Refill: Capillary refill takes less than 2 seconds.  Neurological:     Mental Status: She is alert and oriented to person, place, and time.  Psychiatric:        Behavior: Behavior normal.      Musculoskeletal Exam: Generalized hyperalgesia and positive tender points on examination.  C-spine has limited range of motion without rotation.  Trapezius muscle tension tenderness bilaterally.  Midline spinal tenderness in the thoracic and lumbar region.  Shoulder joints, elbow joints, wrist joints, MCPs, PIPs, DIPs have good range of motion with no synovitis.  She was able to make a complete fist bilaterally.  Tenderness over PIP joints noted.  Hip joints have good range of motion with some discomfort bilaterally.  Knee joints have good range of motion with no warmth or effusion.  Ankle joints have good range of motion with tenderness to palpation bilaterally.  Tenderness over MTP joints noted.  CDAI Exam: CDAI Score: -- Patient Global: --; Provider Global: -- Swollen: --; Tender: -- Joint Exam 03/23/2022   No joint exam has been documented for this visit   There is currently no information documented on the homunculus. Go to the Rheumatology activity and complete the homunculus joint exam.  Investigation: No additional findings.  Imaging: US Venous Img Lower Unilateral Right  Result Date: 03/12/2022 CLINICAL DATA:  Right leg pain and swelling EXAM: RIGHT LOWER EXTREMITY VENOUS DOPPLER ULTRASOUND TECHNIQUE: Gray-scale sonography with graded compression, as well as color Doppler and duplex ultrasound were performed to evaluate the lower extremity deep venous systems from the level of the common femoral vein and including the common femoral, femoral, profunda femoral, popliteal and calf veins including the posterior tibial, peroneal and gastrocnemius veins when visible. The superficial great saphenous vein was also interrogated.  Spectral Doppler was utilized to evaluate flow at rest and with distal augmentation maneuvers in the common femoral, femoral and popliteal veins. COMPARISON:  None Available. FINDINGS: Contralateral Common Femoral Vein: Respiratory phasicity is normal and symmetric with the symptomatic side. No evidence of thrombus. Normal compressibility. Common Femoral Vein: No evidence of thrombus. Normal compressibility, respiratory phasicity and response to augmentation. Saphenofemoral Junction: No evidence of thrombus. Normal compressibility and flow on color Doppler imaging. Profunda  Femoral Vein: No evidence of thrombus. Normal compressibility and flow on color Doppler imaging. Femoral Vein: No evidence of thrombus. Normal compressibility, respiratory phasicity and response to augmentation. Popliteal Vein: No evidence of thrombus. Normal compressibility, respiratory phasicity and response to augmentation. Calf Veins: No evidence of thrombus. Normal compressibility and flow on color Doppler imaging. Superficial Great Saphenous Vein: No evidence of thrombus. Normal compressibility. Venous Reflux:  None. Other Findings:  None. IMPRESSION: No evidence of deep venous thrombosis. Electronically Signed   By: Inez Catalina M.D.   On: 03/12/2022 20:37    Recent Labs: Lab Results  Component Value Date   WBC 12.6 (H) 03/12/2022   HGB 12.2 03/12/2022   PLT 312 03/12/2022   NA 140 03/12/2022   K 3.7 03/12/2022   CL 110 03/12/2022   CO2 23 03/12/2022   GLUCOSE 101 (H) 03/12/2022   BUN 9 03/12/2022   CREATININE 0.70 03/12/2022   BILITOT 0.2 07/19/2021   ALKPHOS 169 (H) 07/19/2021   AST 25 07/19/2021   ALT 26 07/19/2021   PROT 7.5 07/19/2021   ALBUMIN 5.3 (H) 12/07/2021   CALCIUM 9.3 03/12/2022   GFRAA 89 07/15/2020    Speciality Comments: No specialty comments available.  Procedures:  No procedures performed Allergies: Patient has no known allergies.   Assessment / Plan:     Visit Diagnoses: Raynaud's disease  without gangrene - All autoimmune work-up was negative in the past.  Cryoglobulin not detected, beta-2 glycoprotein antibodies negative, cardiolipin antibodies negative, Scl-70 negative, LA not detected, ANCA and ANA negative: She continues to experience intermittent symptoms of Raynaud's.  No digital ulcerations or signs of sclerodactyly were noted.  Good capillary refill noted on examination today.  Discussed the importance of avoiding triggers.  She was advised to notify us if she develops any new or worsening symptoms.  The following lab work was obtained today for further evaluation. - Plan: ANA, Anti-scleroderma antibody, RNP Antibody, Sjogrens syndrome-B extractable nuclear antibody, Sjogrens syndrome-A extractable nuclear antibody, Lupus Anticoagulant Eval w/Reflex, Anti-Smith antibody, Beta-2 glycoprotein antibodies, Cardiolipin antibodies, IgG, IgM, IgA, Anti-DNA antibody, double-stranded, C3 and C4, Sedimentation rate, COMPLETE METABOLIC PANEL WITH GFR, CBC with Differential/Platelet, Urinalysis, Routine w reflex microscopic  Chronic pain of both knees: She continues to experience intermittent discomfort and stiffness in both knee joints.  She has good range of motion of both knee joints.  No warmth or effusion noted.  DDD (degenerative disc disease), cervical - MRI 03/20/21.  Previously tried physical therapy and was previously followed by pain management.  She has limited range of motion with lateral rotation and trapezius muscle tension and tenderness bilaterally.  DDD (degenerative disc disease), lumbar - L5-S1 bilateral foraminal stenosis was noted on her MRI May 09, 2020.  Levoscoliosis.  Chronic pain.  Previously followed by pain management and had injections in the past.  Myofascial pain: She has generalized hyperalgesia and positive tender points on examination.  She experiences intermittent myalgias and muscle tenderness due to myofascial pain syndrome.  She remains on Cymbalta,  Topamax, and tizanidine as prescribed.  She is no longer seeing pain management.  Discussed the importance of regular exercise and good sleep hygiene.   Polyarthralgia -She has been experiencing increased pain and intermittent inflammation in multiple joints.  Her symptoms have been most severe in both hands and both feet.  On examination today she has tenderness palpation over PIP joints in both hands but no tenderness or synovitis of the MCP joints noted.  She was able to make  a complete fist bilaterally.  She has tenderness outpatient of both ankle joints and over the MTP joints in both feet.  The following labs will be obtained today for further evaluation.  We will consider scheduling an ultrasound of her hands or feet pending lab results.  Plan: Sedimentation rate, Rheumatoid factor, Cyclic citrul peptide antibody, IgG, 14-3-3 eta Protein  Sicca complex ([place]) -She has been experiencing increased sicca symptoms.  She has been using Systane eyedrops for eye dryness which has been helpful.  Discussed the use of over-the-counter products for mouth dryness including Biotene and XyliMelts.  Discussed the importance of proper oral hygiene.  She is also been experiencing intermittent oral and nasal ulcerations.  She is concerned about the possible diagnosis of sjogren's syndrome. We will check the following lab work today.  Plan: ANA, Sjogrens syndrome-B extractable nuclear antibody, Sjogrens syndrome-A extractable nuclear antibody  Cerebral venous thrombosis of sigmoid sinus -status post stent.  Patient remains on Plavix and aspirin.  The following labs will be rechecked today.  Plan: Lupus Anticoagulant Eval w/Reflex, Beta-2 glycoprotein antibodies, Cardiolipin antibodies, IgG, IgM, IgA  Other medical conditions are listed as follows:   Essential hypertension: BP was 115/78 today in the office.   Type 2 diabetes mellitus without complication, without long-term current use of insulin ([facility])  Increased  intracranial pressure  AVM (arteriovenous malformation) brain  Hx of migraine headaches  Paresthesia  Attention deficit hyperactivity disorder (ADHD), predominantly inattentive type  Gastroesophageal reflux disease with esophagitis without hemorrhage  PTSD (post-traumatic stress disorder)  Anxiety and depression  Former smoker   Orders Placed This Encounter  Procedures   ANA   Anti-scleroderma antibody   RNP Antibody   Sjogrens syndrome-B extractable nuclear antibody   Sjogrens syndrome-A extractable nuclear antibody   Lupus Anticoagulant Eval w/Reflex   Anti-Smith antibody   Beta-2 glycoprotein antibodies   Cardiolipin antibodies, IgG, IgM, IgA   Anti-DNA antibody, double-stranded   C3 and C4   Sedimentation rate   COMPLETE METABOLIC PANEL WITH GFR   CBC with Differential/Platelet   Urinalysis, Routine w reflex microscopic   Rheumatoid factor   Cyclic citrul peptide antibody, IgG   14-3-3 eta Protein   No orders of the defined types were placed in this encounter.    Follow-Up Instructions: Return in about 6 months (around 09/23/2022) for Raynaud's syndrome, Myofascial pain, DDD.   Ofilia Neas, PA-C  Note - This record has been created using Dragon software.  Chart creation errors have been sought, but may not always  have been located. Such creation errors do not reflect on  the standard of medical care.

## 2022-03-12 ENCOUNTER — Emergency Department (HOSPITAL_BASED_OUTPATIENT_CLINIC_OR_DEPARTMENT_OTHER)
Admission: EM | Admit: 2022-03-12 | Discharge: 2022-03-12 | Disposition: A | Discharge status: Home / Self Care | Payer: No Typology Code available for payment source | Attending: Emergency Medicine | Admitting: Emergency Medicine

## 2022-03-12 ENCOUNTER — Encounter (HOSPITAL_BASED_OUTPATIENT_CLINIC_OR_DEPARTMENT_OTHER): Payer: Self-pay | Admitting: Emergency Medicine

## 2022-03-12 ENCOUNTER — Emergency Department (HOSPITAL_BASED_OUTPATIENT_CLINIC_OR_DEPARTMENT_OTHER): Payer: No Typology Code available for payment source

## 2022-03-12 ENCOUNTER — Other Ambulatory Visit: Payer: Self-pay

## 2022-03-12 DIAGNOSIS — X58XXXA Exposure to other specified factors, initial encounter: Secondary | ICD-10-CM | POA: Insufficient documentation

## 2022-03-12 DIAGNOSIS — S8011XA Contusion of right lower leg, initial encounter: Secondary | ICD-10-CM | POA: Insufficient documentation

## 2022-03-12 DIAGNOSIS — M79604 Pain in right leg: Secondary | ICD-10-CM

## 2022-03-12 DIAGNOSIS — Z7902 Long term (current) use of antithrombotics/antiplatelets: Secondary | ICD-10-CM | POA: Diagnosis not present

## 2022-03-12 DIAGNOSIS — T148XXA Other injury of unspecified body region, initial encounter: Secondary | ICD-10-CM

## 2022-03-12 DIAGNOSIS — Z7982 Long term (current) use of aspirin: Secondary | ICD-10-CM | POA: Diagnosis not present

## 2022-03-12 DIAGNOSIS — S8991XA Unspecified injury of right lower leg, initial encounter: Secondary | ICD-10-CM | POA: Diagnosis present

## 2022-03-12 LAB — BASIC METABOLIC PANEL
Anion gap: 7 (ref 5–15)
BUN: 9 mg/dL (ref 6–20)
CO2: 23 mmol/L (ref 22–32)
Calcium: 9.3 mg/dL (ref 8.9–10.3)
Chloride: 110 mmol/L (ref 98–111)
Creatinine, Ser: 0.7 mg/dL (ref 0.44–1.00)
GFR, Estimated: >60 mL/min (ref >60)
Glucose, Bld: 101 mg/dL — ABNORMAL HIGH (ref 70–99)
Potassium: 3.7 mmol/L (ref 3.5–5.1)
Sodium: 140 mmol/L (ref 135–145)

## 2022-03-12 LAB — CBC WITH DIFFERENTIAL/PLATELET
Abs Immature Granulocytes: 0.06 10*3/uL (ref 0.00–0.07)
Basophils Absolute: 0.1 10*3/uL (ref 0.0–0.1)
Basophils Relative: 1 %
Eosinophils Absolute: 0.2 10*3/uL (ref 0.0–0.5)
Eosinophils Relative: 2 %
HCT: 36.7 % (ref 36.0–46.0)
Hemoglobin: 12.2 g/dL (ref 12.0–15.0)
Immature Granulocytes: 1 %
Lymphocytes Relative: 30 %
Lymphs Abs: 3.8 10*3/uL (ref 0.7–4.0)
MCH: 28.4 pg (ref 26.0–34.0)
MCHC: 33.2 g/dL (ref 30.0–36.0)
MCV: 85.3 fL (ref 80.0–100.0)
Monocytes Absolute: 0.7 10*3/uL (ref 0.1–1.0)
Monocytes Relative: 6 %
Neutro Abs: 7.7 10*3/uL (ref 1.7–7.7)
Neutrophils Relative %: 60 %
Platelets: 312 10*3/uL (ref 150–400)
RBC: 4.3 MIL/uL (ref 3.87–5.11)
RDW: 15.7 % — ABNORMAL HIGH (ref 11.5–15.5)
WBC: 12.6 10*3/uL — ABNORMAL HIGH (ref 4.0–10.5)
nRBC: 0 % (ref 0.0–0.2)

## 2022-03-12 LAB — PROTIME-INR
INR: 1 (ref 0.8–1.2)
Prothrombin Time: 13.1 seconds (ref 11.4–15.2)

## 2022-03-12 NOTE — ED Notes (Signed)
Patient did not want d/c vital signs, stated that her ride was here.

## 2022-03-12 NOTE — ED Triage Notes (Signed)
Reports bruising that goes from outer right leg up to rib cage.  Denies any injury.  Is on plavix.  Reports it started at the knee on Friday and progressed.

## 2022-03-12 NOTE — Discharge Instructions (Signed)
Please follow-up with your primary care doctor to discuss your bruising and for recheck in a few days.  Come back to ER as needed if you are having worsening pain, swelling, bruising or other new concerning symptom.

## 2022-03-13 NOTE — ED Provider Notes (Signed)
[facility] EMERGENCY DEPARTMENT Provider Note   CSN: [id number] Arrival date & time: 03/12/22  1849     History  Chief Complaint  Patient presents with   Bleeding/Bruising    Virginia Douglas is a 46 y.o. female.  Presented emerged department due to concern for bruising on her leg.  Patient states that she is on Plavix and gets frequent bruising.  She does not recall any particular injury but has had bruising that seems to be spreading along her right leg.  She does not have any significant pain, no issues walking.  No shortness of breath or chest pain.  Takes the Plavix due to stent in her brain.  HPI     Home Medications Prior to Admission medications   Medication Sig Start Date End Date Taking? Authorizing Provider  acetaZOLAMIDE (DIAMOX) 250 MG tablet TAKE 1 TABLET BY MOUTH TWICE A DAY Patient taking differently: Take 500 mg by mouth daily. 03/23/21   Jaffe, Adam R, DO  ADDERALL XR 30 MG 24 hr capsule Take 30 mg by mouth daily. 09/29/20   [provider]  AIMOVIG 140 MG/ML SOAJ INJECT 140 MG INTO THE SKIN EVERY 30 DAYS 12/28/21   Pieter Partridge, DO  aspirin EC 81 MG tablet Take 81 mg by mouth daily. Swallow whole.    [provider]  atorvastatin (LIPITOR) 10 MG tablet TAKE 1 TABLET BY MOUTH EVERY DAY 11/03/21   [name], FNP  Blood Glucose Monitoring Suppl (ONETOUCH VERIO IQ SYSTEM) w/Device KIT by Does not apply route. Use to check blood sugars twice    [provider]  Boric Acid Vaginal (AZO BORIC ACID) 600 MG SUPP Place 1 suppository vaginally at bedtime. 02/02/22   Shelly Bombard, MD  buPROPion (WELLBUTRIN XL) 150 MG 24 hr tablet TAKE 1 TABLET BY MOUTH EVERY DAY IN THE MORNING 12/09/21   [name], FNP  cetirizine (ZYRTEC) 10 MG tablet Take 10 mg by mouth daily as needed for allergies.    [provider]  chlorhexidine (PERIDEX) 0.12 % solution 5 mLs by Mouth Rinse route daily. Patient not taking: Reported on 01/31/2022  11/09/21   [provider]  clonazePAM (KLONOPIN) 0.5 MG tablet TAKE 1 TABLET BY MOUTH THREE TIMES A DAY AS NEEDED FOR ANXIETY 02/06/22   [name], FNP  clopidogrel (PLAVIX) 75 MG tablet Take 1 tablet (75 mg total) by mouth daily. 02/09/22   Boisseau, Angie Fava, PA  DULoxetine (CYMBALTA) 60 MG capsule TAKE 1 CAPSULE BY MOUTH EVERY DAY 03/06/22   Tomi Likens, Adam R, DO  famotidine (PEPCID) 20 MG tablet Take 20 mg by mouth daily. 11/09/21   [provider]  glucose blood test strip 1 each by Other route as needed for other. Insert 1 by subcutaneous route 2 times every day check blood sugar before breakfast and dinner    [provider]  hydrOXYzine (VISTARIL) 50 MG capsule TAKE 1-2 CAPSULES BY MOUTH AS NEEDED FOR SLEEP AT BEDTIME 12/09/21   [name], FNP  Insulin Pen Needle (PEN NEEDLES) 32G X 4 MM MISC 1 each by Does not apply route once a week. 11/11/18   [name], FNP  linaclotide Rolan Lipa) 72 MCG capsule Take 72 mcg by mouth daily as needed (Constipation).    [provider]  lisinopril-hydrochlorothiazide (ZESTORETIC) 10-12.5 MG tablet TAKE 1 TABLET BY MOUTH EVERY DAY 11/03/21   [name], FNP  metroNIDAZOLE (FLAGYL) 500 MG tablet Take 1 tablet (500 mg total) by mouth 2 (  two) times daily. 01/31/22   Shelly Bombard, MD  omeprazole (PRILOSEC) 40 MG capsule Take 40 mg by mouth daily.    [provider]  ondansetron (ZOFRAN-ODT) 4 MG disintegrating tablet 4 mg daily as needed for nausea or vomiting. 11/09/21   [provider]  OZEMPIC, 2 MG/DOSE, 8 MG/3ML SOPN INJECT 2 MG INTO THE SKIN ONCE A WEEK. 01/04/22   [name], FNP  phentermine 15 MG capsule TAKE 1 CAPSULE BY MOUTH EVERY DAY IN THE MORNING 02/06/22   [name], FNP  Polyethyl Glycol-Propyl Glycol (SYSTANE) 0.4-0.3 % SOLN Place 1 drop into both eyes daily.    [provider]  saccharomyces boulardii (FLORASTOR) 250 MG capsule Take 250 mg by mouth daily. gummy    [provider]  tiZANidine (ZANAFLEX) 4 MG tablet Take 4 mg by mouth daily as needed for muscle spasms. 11/09/21   [provider]  topiramate (TOPAMAX) 50 MG tablet TAKE 2 AND 1/2 TABLETS (125 MG TOTAL) BY MOUTH AT BEDTIME. 03/06/22   Jaffe, Adam R, DO  Vitamin D, Ergocalciferol, (DRISDOL) 1.25 MG (50000 UNIT) CAPS capsule TAKE 1 CAPSULE BY MOUTH EVERY 7 DAYS **NOT COVERED** 12/19/21   [name], FNP  metFORMIN (GLUCOPHAGE-XR) 500 MG 24 hr tablet Take 2 tablets (1,000 mg total) by mouth 2 (two) times daily. Patient not taking: Reported on 06/05/2019 03/10/16 08/15/19  [name], MD  Phentermine-Topiramate 11.25-69 MG CP24 Take 1 tablet by mouth daily. 02/04/19 04/07/19  [name], FNP      Allergies    Patient has no known allergies.    Review of Systems   Review of Systems  Constitutional:  Negative for chills and fever.  HENT:  Negative for ear pain and sore throat.   Eyes:  Negative for pain and visual disturbance.  Respiratory:  Negative for cough and shortness of breath.   Cardiovascular:  Negative for chest pain and palpitations.  Gastrointestinal:  Negative for abdominal pain and vomiting.  Genitourinary:  Negative for dysuria and hematuria.  Musculoskeletal:  Positive for arthralgias. Negative for back pain.  Skin:  Positive for color change. Negative for rash.  Neurological:  Negative for seizures and syncope.  All other systems reviewed and are negative.   Physical Exam Updated Vital Signs BP 122/77 (BP Location: Right Arm)   Pulse 94   Temp 98.4 F (36.9 C) (Oral)   Resp 18   Ht 5' 6"  (1.676 m)   Wt 92.5 kg   SpO2 97%   BMI 32.93 kg/m  Physical Exam Vitals and nursing note reviewed.  Constitutional:      General: She is not in acute distress.    Appearance: She is well-developed.  HENT:     Head: Normocephalic and atraumatic.  Eyes:     Conjunctiva/sclera: Conjunctivae normal.  Cardiovascular:     Rate and Rhythm: Normal rate and regular rhythm.      Heart sounds: No murmur heard. Pulmonary:     Effort: Pulmonary effort is normal. No respiratory distress.     Breath sounds: Normal breath sounds.  Abdominal:     Palpations: Abdomen is soft.     Tenderness: There is no abdominal tenderness.  Musculoskeletal:     Cervical back: Neck supple.     Comments: There is some scattered ecchymosis across her right lateral thigh, no overlying erythema; there is no significant swelling noted, normal DP and PT pulses  Skin:    General: Skin is warm and dry.  Capillary Refill: Capillary refill takes less than 2 seconds.  Neurological:     Mental Status: She is alert.  Psychiatric:        Mood and Affect: Mood normal.     ED Results / Procedures / Treatments   Labs (all labs ordered are listed, but only abnormal results are displayed) Labs Reviewed  CBC WITH DIFFERENTIAL/PLATELET - Abnormal; Notable for the following components:      Result Value   WBC 12.6 (*)    RDW 15.7 (*)    All other components within normal limits  BASIC METABOLIC PANEL - Abnormal; Notable for the following components:   Glucose, Bld 101 (*)    All other components within normal limits  PROTIME-INR    EKG None  Radiology US Venous Img Lower Unilateral Right  Result Date: 03/12/2022 CLINICAL DATA:  Right leg pain and swelling EXAM: RIGHT LOWER EXTREMITY VENOUS DOPPLER ULTRASOUND TECHNIQUE: Gray-scale sonography with graded compression, as well as color Doppler and duplex ultrasound were performed to evaluate the lower extremity deep venous systems from the level of the common femoral vein and including the common femoral, femoral, profunda femoral, popliteal and calf veins including the posterior tibial, peroneal and gastrocnemius veins when visible. The superficial great saphenous vein was also interrogated. Spectral Doppler was utilized to evaluate flow at rest and with distal augmentation maneuvers in the common femoral, femoral and popliteal veins.  COMPARISON:  None Available. FINDINGS: Contralateral Common Femoral Vein: Respiratory phasicity is normal and symmetric with the symptomatic side. No evidence of thrombus. Normal compressibility. Common Femoral Vein: No evidence of thrombus. Normal compressibility, respiratory phasicity and response to augmentation. Saphenofemoral Junction: No evidence of thrombus. Normal compressibility and flow on color Doppler imaging. Profunda Femoral Vein: No evidence of thrombus. Normal compressibility and flow on color Doppler imaging. Femoral Vein: No evidence of thrombus. Normal compressibility, respiratory phasicity and response to augmentation. Popliteal Vein: No evidence of thrombus. Normal compressibility, respiratory phasicity and response to augmentation. Calf Veins: No evidence of thrombus. Normal compressibility and flow on color Doppler imaging. Superficial Great Saphenous Vein: No evidence of thrombus. Normal compressibility. Venous Reflux:  None. Other Findings:  None. IMPRESSION: No evidence of deep venous thrombosis. Electronically Signed   By: Inez Catalina M.D.   On: 03/12/2022 20:37    Procedures Procedures    Medications Ordered in ED Medications - No data to display  ED Course/ Medical Decision Making/ A&P                           Medical Decision Making Amount and/or Complexity of Data Reviewed Labs: ordered.   46 year old lady presented to ER due to concern for bruise on her right leg, concerned about possible DVT.  Her leg is neurovascular intact.  She does have some ecchymosis in the leg.  Does not extend to her flank.  She has no anemia on CBC.  No thrombocytopenia.  Her DVT study is negative.  Independently reviewed and interpreted results, agree with radiology report, no DVT.  Recommend supportive care, monitoring at home and follow-up with primary care doctor.  Reviewed return precautions and discharge.  After the discussed management above, the patient was determined to be  safe for discharge.  The patient was in agreement with this plan and all questions regarding their care were answered.  ED return precautions were discussed and the patient will return to the ED with any significant worsening of condition.  Final Clinical Impression(s) / ED Diagnoses Final diagnoses:  Pain of right lower extremity  Bruising    Rx / DC Orders ED Discharge Orders     None         Lucrezia Starch, MD 03/13/22 1523

## 2022-03-14 ENCOUNTER — Encounter: Payer: Self-pay | Admitting: Neurology

## 2022-03-14 ENCOUNTER — Telehealth (HOSPITAL_COMMUNITY): Payer: Self-pay

## 2022-03-14 ENCOUNTER — Telehealth: Payer: Self-pay

## 2022-03-14 NOTE — Telephone Encounter (Signed)
Transition Care Management Follow-up Telephone Call Date of discharge and from where: 03/12/2022 [facility]  How have you been since you were released from the hospital? Pt states she is still a little sore, but she is doing okay.  Any questions or concerns? No  Items Reviewed: Did the pt receive and understand the discharge instructions provided? Yes  Medications obtained and verified? Yes  Other? No  Any new allergies since your discharge? No  Dietary orders reviewed? Yes Do you have support at home? Yes   Home Care and Equipment/Supplies: Were home health services ordered? no If so, what is the name of the agency? N/a  Has the agency set up a time to come to the patient's home? no Were any new equipment or medical supplies ordered?  No What is the name of the medical supply agency? N/a Were you able to get the supplies/equipment? no Do you have any questions related to the use of the equipment or supplies? No  Functional Questionnaire: (I = Independent and D = Dependent) ADLs: i  Bathing/Dressing- i  Meal Prep- i  Eating- i  Maintaining continence- i  Transferring/Ambulation- i  Managing Meds- i  Follow up appointments reviewed:  PCP Hospital f/u appt confirmed? Yes  Scheduled to see janece moore on n/a @ n/a. [facility] f/u appt confirmed? No  Scheduled to see n/a on n/a @ n/a. Are transportation arrangements needed? No  If their condition worsens, is the pt aware to call PCP or go to the Emergency Dept.? Yes Was the patient provided with contact information for the PCP's office or ED? Yes Was to pt encouraged to call back with questions or concerns? Yes

## 2022-03-14 NOTE — Telephone Encounter (Signed)
Faxed most recent h&p with med list and allergies to [facility] [phone]. AW

## 2022-03-16 ENCOUNTER — Ambulatory Visit (INDEPENDENT_AMBULATORY_CARE_PROVIDER_SITE_OTHER): Payer: No Typology Code available for payment source | Admitting: Nurse Practitioner

## 2022-03-16 ENCOUNTER — Encounter: Payer: Self-pay | Admitting: Nurse Practitioner

## 2022-03-16 VITALS — BP 124/70 | HR 98 | Temp 98.6°F | Ht 69.0 in | Wt 206.8 lb

## 2022-03-16 DIAGNOSIS — M79604 Pain in right leg: Secondary | ICD-10-CM

## 2022-03-16 DIAGNOSIS — Z6830 Body mass index (BMI) 30.0-30.9, adult: Secondary | ICD-10-CM

## 2022-03-16 DIAGNOSIS — I10 Essential (primary) hypertension: Secondary | ICD-10-CM

## 2022-03-16 DIAGNOSIS — E6609 Other obesity due to excess calories: Secondary | ICD-10-CM

## 2022-03-16 DIAGNOSIS — T148XXA Other injury of unspecified body region, initial encounter: Secondary | ICD-10-CM

## 2022-03-16 DIAGNOSIS — Q282 Arteriovenous malformation of cerebral vessels: Secondary | ICD-10-CM

## 2022-03-16 DIAGNOSIS — E669 Obesity, unspecified: Secondary | ICD-10-CM

## 2022-03-16 DIAGNOSIS — E1169 Type 2 diabetes mellitus with other specified complication: Secondary | ICD-10-CM

## 2022-03-16 LAB — HEMOGLOBIN A1C
Est. average glucose Bld gHb Est-mCnc: 134 mg/dL
Hgb A1c MFr Bld: 6.3 % — ABNORMAL HIGH (ref 4.8–5.6)

## 2022-03-16 MED ORDER — PHENTERMINE HCL 30 MG PO CAPS: 30.0000 mg | ORAL_CAPSULE | ORAL | 1 refills | Status: DC

## 2022-03-16 MED ORDER — TRULICITY 1.5 MG/0.5ML ~~LOC~~ SOAJ: 1.5000 mg | SUBCUTANEOUS | 1 refills | Status: DC

## 2022-03-16 NOTE — Progress Notes (Signed)
Virginia Douglas,acting as a Education administrator for Virginia Brine, FNP.,have documented all relevant documentation on the behalf of Virginia Brine, FNP,as directed by  Virginia Brine, FNP while in the presence of Virginia Douglas, [place].    Subjective:     Patient ID: Virginia Douglas , female    DOB: [birth year] , 46 y.o.   MRN: [id number]   Chief Complaint  Patient presents with    ER F/U    HPI  Patient presents today for a ER f/u. She reports Saturday she woke up and she had bruising all on her right leg up to lateral stomach. She does not recall hitting her leg.  She had felt a knot and wanted to get it checked. Reports she had been itching the day before on the right arm and leg. She continues to take Plavix.  She reports they did do testing but everything came back normal. She is going to [facility] on Aug 28th for gamma knife for her AVM. Does not need a preop clearance. Dr Mariea Clonts is her provider at [facility].    Wt Readings from Last 3 Encounters: 03/16/22 : 206 lb 12.8 oz (93.8 kg) 03/12/22 : 204 lb (92.5 kg) 01/31/22 : 216 lb (98 kg)       Past Medical History:  Diagnosis Date   ADHD (attention deficit hyperactivity disorder)    Anemia    iron deficiency   as a teenager   Anxiety    Arthritis    Diabetes ([place])    Edema    GERD (gastroesophageal reflux disease)    Hypertension    patient denies- patient stated that the PCP said it was for heart prevention.   Migraine headache      Family History  Problem Relation Age of Onset   Diabetes Mother    Hypertension Mother    Diabetes Father    Hypertension Father    Kidney failure Father    Hypertension Brother    Cancer Maternal Grandmother    Diabetes Maternal Grandmother    Pancreatic cancer Maternal Grandmother    Diabetes Paternal Grandmother    Breast cancer Paternal Grandmother    Stroke Paternal Grandfather    Healthy Son    Healthy Son    Cancer Maternal Aunt        breast   Breast cancer Maternal Aunt      Current  Outpatient Medications:    acetaZOLAMIDE (DIAMOX) 250 MG tablet, TAKE 1 TABLET BY MOUTH TWICE A DAY (Patient taking differently: Take 500 mg by mouth daily.), Disp: 60 tablet, Rfl: 2   ADDERALL XR 30 MG 24 hr capsule, Take 30 mg by mouth daily., Disp: , Rfl:    AIMOVIG 140 MG/ML SOAJ, INJECT 140 MG INTO THE SKIN EVERY 30 DAYS, Disp: 1 mL, Rfl: 2   aspirin EC 81 MG tablet, Take 81 mg by mouth daily. Swallow whole., Disp: , Rfl:    atorvastatin (LIPITOR) 10 MG tablet, TAKE 1 TABLET BY MOUTH EVERY DAY, Disp: 90 tablet, Rfl: 1   Blood Glucose Monitoring Suppl (ONETOUCH VERIO IQ SYSTEM) w/Device KIT, by Does not apply route. Use to check blood sugars twice, Disp: , Rfl:    Boric Acid Vaginal (AZO BORIC ACID) 600 MG SUPP, Place 1 suppository vaginally at bedtime., Disp: 14 suppository, Rfl: 0   buPROPion (WELLBUTRIN XL) 150 MG 24 hr tablet, TAKE 1 TABLET BY MOUTH EVERY DAY IN THE MORNING, Disp: 90 tablet, Rfl: 1   cetirizine (ZYRTEC) 10 MG tablet,  Take 10 mg by mouth daily as needed for allergies., Disp: , Rfl:    clonazePAM (KLONOPIN) 0.5 MG tablet, TAKE 1 TABLET BY MOUTH THREE TIMES A DAY AS NEEDED FOR ANXIETY, Disp: 30 tablet, Rfl: 1   clopidogrel (PLAVIX) 75 MG tablet, Take 1 tablet (75 mg total) by mouth daily., Disp: 90 tablet, Rfl: 2   Dulaglutide (TRULICITY) 1.5 DG/3.8VF SOPN, Inject 1.5 mg into the skin once a week., Disp: 6 mL, Rfl: 1   DULoxetine (CYMBALTA) 60 MG capsule, TAKE 1 CAPSULE BY MOUTH EVERY DAY, Disp: 90 capsule, Rfl: 0   famotidine (PEPCID) 20 MG tablet, Take 20 mg by mouth daily., Disp: , Rfl:    glucose blood test strip, 1 each by Other route as needed for other. Insert 1 by subcutaneous route 2 times every day check blood sugar before breakfast and dinner, Disp: , Rfl:    hydrOXYzine (VISTARIL) 50 MG capsule, TAKE 1-2 CAPSULES BY MOUTH AS NEEDED FOR SLEEP AT BEDTIME, Disp: 180 capsule, Rfl: 1   Insulin Pen Needle (PEN NEEDLES) 32G X 4 MM MISC, 1 each by Does not apply route once  a week., Disp: 30 each, Rfl: 3   linaclotide (LINZESS) 72 MCG capsule, Take 72 mcg by mouth daily as needed (Constipation)., Disp: , Rfl:    lisinopril-hydrochlorothiazide (ZESTORETIC) 10-12.5 MG tablet, TAKE 1 TABLET BY MOUTH EVERY DAY, Disp: 90 tablet, Rfl: 1   metroNIDAZOLE (FLAGYL) 500 MG tablet, Take 1 tablet (500 mg total) by mouth 2 (two) times daily., Disp: 14 tablet, Rfl: 2   omeprazole (PRILOSEC) 40 MG capsule, Take 40 mg by mouth daily., Disp: , Rfl:    ondansetron (ZOFRAN-ODT) 4 MG disintegrating tablet, 4 mg daily as needed for nausea or vomiting., Disp: , Rfl:    Polyethyl Glycol-Propyl Glycol (SYSTANE) 0.4-0.3 % SOLN, Place 1 drop into both eyes daily., Disp: , Rfl:    saccharomyces boulardii (FLORASTOR) 250 MG capsule, Take 250 mg by mouth daily. gummy, Disp: , Rfl:    tiZANidine (ZANAFLEX) 4 MG tablet, Take 4 mg by mouth daily as needed for muscle spasms., Disp: , Rfl:    topiramate (TOPAMAX) 50 MG tablet, TAKE 2 AND 1/2 TABLETS (125 MG TOTAL) BY MOUTH AT BEDTIME., Disp: 225 tablet, Rfl: 0   Vitamin D, Ergocalciferol, (DRISDOL) 1.25 MG (50000 UNIT) CAPS capsule, TAKE 1 CAPSULE BY MOUTH EVERY 7 DAYS **NOT COVERED**, Disp: 12 capsule, Rfl: 0   chlorhexidine (PERIDEX) 0.12 % solution, 5 mLs by Mouth Rinse route daily. (Patient not taking: Reported on 01/31/2022), Disp: , Rfl:    phentermine 30 MG capsule, Take 1 capsule (30 mg total) by mouth every morning., Disp: 30 capsule, Rfl: 1   No Known Allergies   Review of Systems  Constitutional: Negative.   Respiratory: Negative.    Cardiovascular: Negative.   Gastrointestinal: Negative.   Neurological: Negative.   Psychiatric/Behavioral: Negative.       Today's Vitals   03/16/22 0858  BP: 124/70  Pulse: 98  Temp: 98.6 F (37 C)  Weight: 206 lb 12.8 oz (93.8 kg)  Height: 5' 9"  (1.753 m)  PainSc: 0-No pain   Body mass index is 30.54 kg/m.   Objective:  Physical Exam Vitals reviewed.  Constitutional:      General: She  is not in acute distress.    Appearance: Normal appearance. She is well-developed. She is obese.  HENT:     Head: Normocephalic and atraumatic.  Eyes:     Pupils: Pupils are equal,  round, and reactive to light.  Cardiovascular:     Rate and Rhythm: Normal rate and regular rhythm.     Pulses: Normal pulses.     Heart sounds: Normal heart sounds. No murmur heard. Pulmonary:     Effort: Pulmonary effort is normal. No respiratory distress.     Breath sounds: Normal breath sounds. No wheezing.  Musculoskeletal:        General: No swelling or tenderness. Normal range of motion.  Skin:    General: Skin is warm and dry.     Capillary Refill: Capillary refill takes less than 2 seconds.  Neurological:     General: No focal deficit present.     Mental Status: She is alert and oriented to person, place, and time.     Cranial Nerves: No cranial nerve deficit.     Motor: No weakness.  Psychiatric:        Mood and Affect: Mood normal.        Behavior: Behavior normal.        Thought Content: Thought content normal.        Judgment: Judgment normal.         Assessment And Plan:     1. Pain of right lower extremity Comments: Improving,   2. Bruising Comments: Improving, no abnormal findings during her visit to ER  3. Diabetes mellitus type 2 in obese [facility]) Comments: HgbA1c slightly elevated at last visit. Will change to Trulicity due to Ozempic cost even with insurance.  - Dulaglutide (TRULICITY) 1.5 BE/0.1EO SOPN; Inject 1.5 mg into the skin once a week.  Dispense: 6 mL; Refill: 1 - Hemoglobin A1c  4. Essential hypertension Comments: Blood pressure is normal, continue current medications.    5. AVM (arteriovenous malformation) brain Comments: Continue follow up with Neurosurgeon.   6. Class 1 obesity due to excess calories with body mass index (BMI) of 30.0 to 30.9 in adult, unspecified whether serious comorbidity present Chronic Discussed healthy diet and regular exercise  options  Encouraged to exercise at least 150 minutes per week with 2 days of strength training Will increase phentermine to 30 mg daily.  - phentermine 30 MG capsule; Take 1 capsule (30 mg total) by mouth every morning.  Dispense: 30 capsule; Refill: 1    Patient was given opportunity to ask questions. Patient verbalized understanding of the plan and was able to repeat key elements of the plan. All questions were answered to their satisfaction.  Virginia Brine, FNP   I, Virginia Brine, FNP, have reviewed all documentation for this visit. The documentation on 03/17/22 for the exam, diagnosis, procedures, and orders are all accurate and complete.   IF YOU HAVE BEEN REFERRED TO A SPECIALIST, IT MAY TAKE 1-2 WEEKS TO SCHEDULE/PROCESS THE REFERRAL. IF YOU HAVE NOT HEARD FROM US/SPECIALIST IN TWO WEEKS, PLEASE GIVE [place] A CALL AT [phone].   THE PATIENT IS ENCOURAGED TO PRACTICE SOCIAL DISTANCING DUE TO THE COVID-19 PANDEMIC.

## 2022-03-17 ENCOUNTER — Encounter: Payer: Self-pay | Admitting: Nurse Practitioner

## 2022-03-20 ENCOUNTER — Encounter: Payer: Self-pay | Admitting: Nurse Practitioner

## 2022-03-20 ENCOUNTER — Other Ambulatory Visit: Payer: Self-pay | Admitting: Nurse Practitioner

## 2022-03-23 ENCOUNTER — Ambulatory Visit (INDEPENDENT_AMBULATORY_CARE_PROVIDER_SITE_OTHER): Payer: No Typology Code available for payment source | Admitting: Physician Assistant

## 2022-03-23 ENCOUNTER — Encounter: Payer: Self-pay | Admitting: Physician Assistant

## 2022-03-23 VITALS — BP 115/78 | HR 86 | Ht 66.0 in | Wt 205.8 lb

## 2022-03-23 DIAGNOSIS — G08 Intracranial and intraspinal phlebitis and thrombophlebitis: Secondary | ICD-10-CM

## 2022-03-23 DIAGNOSIS — M5030 Other cervical disc degeneration, unspecified cervical region: Secondary | ICD-10-CM

## 2022-03-23 DIAGNOSIS — M5136 Other intervertebral disc degeneration, lumbar region: Secondary | ICD-10-CM | POA: Diagnosis not present

## 2022-03-23 DIAGNOSIS — Z87891 Personal history of nicotine dependence: Secondary | ICD-10-CM

## 2022-03-23 DIAGNOSIS — F419 Anxiety disorder, unspecified: Secondary | ICD-10-CM

## 2022-03-23 DIAGNOSIS — Z8669 Personal history of other diseases of the nervous system and sense organs: Secondary | ICD-10-CM

## 2022-03-23 DIAGNOSIS — M3500 Sicca syndrome, unspecified: Secondary | ICD-10-CM

## 2022-03-23 DIAGNOSIS — M51369 Other intervertebral disc degeneration, lumbar region without mention of lumbar back pain or lower extremity pain: Secondary | ICD-10-CM

## 2022-03-23 DIAGNOSIS — K2100 Gastro-esophageal reflux disease with esophagitis, without bleeding: Secondary | ICD-10-CM

## 2022-03-23 DIAGNOSIS — I7300 Raynaud's syndrome without gangrene: Secondary | ICD-10-CM | POA: Diagnosis not present

## 2022-03-23 DIAGNOSIS — G8929 Other chronic pain: Secondary | ICD-10-CM

## 2022-03-23 DIAGNOSIS — M7918 Myalgia, other site: Secondary | ICD-10-CM

## 2022-03-23 DIAGNOSIS — F4310 Post-traumatic stress disorder, unspecified: Secondary | ICD-10-CM

## 2022-03-23 DIAGNOSIS — M25562 Pain in left knee: Secondary | ICD-10-CM

## 2022-03-23 DIAGNOSIS — E119 Type 2 diabetes mellitus without complications: Secondary | ICD-10-CM

## 2022-03-23 DIAGNOSIS — R202 Paresthesia of skin: Secondary | ICD-10-CM

## 2022-03-23 DIAGNOSIS — M2550 Pain in unspecified joint: Secondary | ICD-10-CM

## 2022-03-23 DIAGNOSIS — M25561 Pain in right knee: Secondary | ICD-10-CM

## 2022-03-23 DIAGNOSIS — I10 Essential (primary) hypertension: Secondary | ICD-10-CM

## 2022-03-23 DIAGNOSIS — F32A Depression, unspecified: Secondary | ICD-10-CM

## 2022-03-23 DIAGNOSIS — G932 Benign intracranial hypertension: Secondary | ICD-10-CM

## 2022-03-23 DIAGNOSIS — F900 Attention-deficit hyperactivity disorder, predominantly inattentive type: Secondary | ICD-10-CM

## 2022-03-23 DIAGNOSIS — Q282 Arteriovenous malformation of cerebral vessels: Secondary | ICD-10-CM

## 2022-03-24 ENCOUNTER — Ambulatory Visit: Payer: No Typology Code available for payment source | Admitting: Physician Assistant

## 2022-03-27 LAB — SEDIMENTATION RATE: Sed Rate: 25 mm/h — ABNORMAL HIGH (ref 0–20)

## 2022-03-27 LAB — CBC WITH DIFFERENTIAL/PLATELET
Absolute Monocytes: 500 cells/uL (ref 200–950)
Basophils Absolute: 78 cells/uL (ref 0–200)
Basophils Relative: 0.8 %
Eosinophils Absolute: 137 cells/uL (ref 15–500)
Eosinophils Relative: 1.4 %
HCT: 42.8 % (ref 35.0–45.0)
Hemoglobin: 14.3 g/dL (ref 11.7–15.5)
Lymphs Abs: 2911 cells/uL (ref 850–3900)
MCH: 28.1 pg (ref 27.0–33.0)
MCHC: 33.4 g/dL (ref 32.0–36.0)
MCV: 84.1 fL (ref 80.0–100.0)
MPV: 10.8 fL (ref 7.5–12.5)
Monocytes Relative: 5.1 %
Neutro Abs: 6174 cells/uL (ref 1500–7800)
Neutrophils Relative %: 63 %
Platelets: 364 10*3/uL (ref 140–400)
RBC: 5.09 10*6/uL (ref 3.80–5.10)
RDW: 14.8 % (ref 11.0–15.0)
Total Lymphocyte: 29.7 %
WBC: 9.8 10*3/uL (ref 3.8–10.8)

## 2022-03-27 NOTE — Progress Notes (Signed)
ESR is borderline elevated-25. CBC WNL.

## 2022-03-28 NOTE — Progress Notes (Signed)
Glucose is 117. Alk phos is borderline elevated-130. Rest of CMP WNL.   UA normal.   Complements WNL.   RF negative.

## 2022-03-29 NOTE — Progress Notes (Signed)
Scl-70 and RNP negative. Ro and La antibodies negative.  Smith antibody negative.  dsDNA is negative.

## 2022-03-30 ENCOUNTER — Encounter: Payer: Self-pay | Admitting: Nurse Practitioner

## 2022-03-31 NOTE — Progress Notes (Signed)
ANA is low positive titer. Anticardiolipin and beta-2 glycoprotein antibodies negative.

## 2022-04-02 ENCOUNTER — Other Ambulatory Visit: Payer: Self-pay | Admitting: Nurse Practitioner

## 2022-04-03 ENCOUNTER — Ambulatory Visit (INDEPENDENT_AMBULATORY_CARE_PROVIDER_SITE_OTHER): Payer: No Typology Code available for payment source | Admitting: Nurse Practitioner

## 2022-04-03 ENCOUNTER — Encounter: Payer: Self-pay | Admitting: Nurse Practitioner

## 2022-04-03 VITALS — BP 130/64 | HR 84 | Temp 98.7°F | Ht 66.0 in | Wt 208.0 lb

## 2022-04-03 DIAGNOSIS — R109 Unspecified abdominal pain: Secondary | ICD-10-CM

## 2022-04-03 DIAGNOSIS — R82998 Other abnormal findings in urine: Secondary | ICD-10-CM | POA: Diagnosis not present

## 2022-04-03 DIAGNOSIS — Z23 Encounter for immunization: Secondary | ICD-10-CM

## 2022-04-03 LAB — POCT URINALYSIS DIPSTICK
Bilirubin, UA: NEGATIVE
Blood, UA: NEGATIVE
Glucose, UA: NEGATIVE
Nitrite, UA: NEGATIVE
Protein, UA: NEGATIVE
Spec Grav, UA: 1.025 (ref 1.010–1.025)
Urobilinogen, UA: 0.2 E.U./dL
pH, UA: 5.5 (ref 5.0–8.0)

## 2022-04-03 MED ORDER — HPV 9-VALENT RECOMB VACCINE IM SUSP: 0.5000 mL | Freq: Once | INTRAMUSCULAR | 0 refills | Status: AC

## 2022-04-03 MED ORDER — SULFAMETHOXAZOLE-TRIMETHOPRIM 800-160 MG PO TABS: 1.0000 | ORAL_TABLET | Freq: Two times a day (BID) | ORAL | 0 refills | Status: DC

## 2022-04-03 NOTE — Progress Notes (Signed)
Barnet Glasgow Martin,acting as a Education administrator for [name], FNP.,have documented all relevant documentation on the behalf of [name], FNP,as directed by  [name], FNP while in the presence of [name], [place].    Subjective:     Patient ID: Virginia Douglas , female    DOB: 1976-04-16 , 46 y.o.   MRN: [id number]   Chief Complaint  Patient presents with   Back Pain    HPI  Patient presents today for back pain (left flank area) and she states she only urinated one time on Friday, she increased her water and cranberry juice intake and it helped a little. Patient states over the weekend she was experiencing chills and a tempeture of 99. She states when she urinates it feels like she has to keep going but nothing comes out, patient states no discharge.   Patient goes to [place].     Past Medical History:  Diagnosis Date   ADHD (attention deficit hyperactivity disorder)    Anemia    iron deficiency   as a teenager   Anxiety    Arthritis    Diabetes ([place])    Edema    GERD (gastroesophageal reflux disease)    Hypertension    patient denies- patient stated that the PCP said it was for heart prevention.   Migraine headache      Family History  Problem Relation Age of Onset   Diabetes Mother    Hypertension Mother    Diabetes Father    Hypertension Father    Kidney failure Father    Hypertension Brother    Cancer Maternal Grandmother    Diabetes Maternal Grandmother    Pancreatic cancer Maternal Grandmother    Diabetes Paternal Grandmother    Breast cancer Paternal Grandmother    Stroke Paternal Grandfather    Healthy Son    Healthy Son    Cancer Maternal Aunt        breast   Breast cancer Maternal Aunt      Current Outpatient Medications:    acetaZOLAMIDE (DIAMOX) 250 MG tablet, TAKE 1 TABLET BY MOUTH TWICE A DAY, Disp: 60 tablet, Rfl: 2   ADDERALL XR 30 MG 24 hr capsule, Take 30 mg by mouth daily., Disp: , Rfl:    AIMOVIG 140 MG/ML SOAJ, INJECT 140  MG INTO THE SKIN EVERY 30 DAYS, Disp: 1 mL, Rfl: 2   aspirin EC 81 MG tablet, Take 81 mg by mouth daily. Swallow whole., Disp: , Rfl:    atorvastatin (LIPITOR) 10 MG tablet, TAKE 1 TABLET BY MOUTH EVERY DAY, Disp: 90 tablet, Rfl: 1   Blood Glucose Monitoring Suppl (ONETOUCH VERIO IQ SYSTEM) w/Device KIT, by Does not apply route. Use to check blood sugars twice, Disp: , Rfl:    Boric Acid Vaginal (AZO BORIC ACID) 600 MG SUPP, Place 1 suppository vaginally at bedtime., Disp: 14 suppository, Rfl: 0   buPROPion (WELLBUTRIN XL) 150 MG 24 hr tablet, TAKE 1 TABLET BY MOUTH EVERY DAY IN THE MORNING, Disp: 90 tablet, Rfl: 1   cetirizine (ZYRTEC) 10 MG tablet, Take 10 mg by mouth daily as needed for allergies., Disp: , Rfl:    chlorhexidine (PERIDEX) 0.12 % solution, 5 mLs by Mouth Rinse route daily., Disp: , Rfl:    clonazePAM (KLONOPIN) 0.5 MG tablet, TAKE 1 TABLET BY MOUTH THREE TIMES A DAY AS NEEDED FOR ANXIETY, Disp: 30 tablet, Rfl: 1   clopidogrel (PLAVIX) 75 MG tablet, Take 1 tablet (75 mg total)  by mouth daily., Disp: 90 tablet, Rfl: 2   Dulaglutide (TRULICITY) 1.5 EG/3.1DV SOPN, Inject 1.5 mg into the skin once a week., Disp: 6 mL, Rfl: 1   DULoxetine (CYMBALTA) 60 MG capsule, TAKE 1 CAPSULE BY MOUTH EVERY DAY, Disp: 90 capsule, Rfl: 0   famotidine (PEPCID) 20 MG tablet, Take 20 mg by mouth daily., Disp: , Rfl:    glucose blood test strip, 1 each by Other route as needed for other. Insert 1 by subcutaneous route 2 times every day check blood sugar before breakfast and dinner, Disp: , Rfl:    hpv 9-valent vaccine (GARDASIL 9) SUSP injection, Inject 0.5 mLs into the muscle once for 1 dose., Disp: 0.5 mL, Rfl: 0   hydrOXYzine (VISTARIL) 50 MG capsule, TAKE 1-2 CAPSULES BY MOUTH AS NEEDED FOR SLEEP AT BEDTIME, Disp: 180 capsule, Rfl: 1   Insulin Pen Needle (PEN NEEDLES) 32G X 4 MM MISC, 1 each by Does not apply route once a week., Disp: 30 each, Rfl: 3   linaclotide (LINZESS) 72 MCG capsule, Take 72 mcg  by mouth daily as needed (Constipation)., Disp: , Rfl:    lisinopril-hydrochlorothiazide (ZESTORETIC) 10-12.5 MG tablet, TAKE 1 TABLET BY MOUTH EVERY DAY, Disp: 90 tablet, Rfl: 1   omeprazole (PRILOSEC) 40 MG capsule, Take 40 mg by mouth daily., Disp: , Rfl:    ondansetron (ZOFRAN-ODT) 4 MG disintegrating tablet, 4 mg daily as needed for nausea or vomiting., Disp: , Rfl:    phentermine 30 MG capsule, Take 1 capsule (30 mg total) by mouth every morning., Disp: 30 capsule, Rfl: 1   Polyethyl Glycol-Propyl Glycol (SYSTANE) 0.4-0.3 % SOLN, Place 1 drop into both eyes daily., Disp: , Rfl:    saccharomyces boulardii (FLORASTOR) 250 MG capsule, Take 250 mg by mouth daily. gummy, Disp: , Rfl:    sulfamethoxazole-trimethoprim (BACTRIM DS) 800-160 MG tablet, Take 1 tablet by mouth 2 (two) times daily., Disp: 28 tablet, Rfl: 0   tiZANidine (ZANAFLEX) 4 MG tablet, Take 4 mg by mouth daily as needed for muscle spasms., Disp: , Rfl:    topiramate (TOPAMAX) 50 MG tablet, TAKE 2 AND 1/2 TABLETS (125 MG TOTAL) BY MOUTH AT BEDTIME., Disp: 225 tablet, Rfl: 0   Vitamin D, Ergocalciferol, (DRISDOL) 1.25 MG (50000 UNIT) CAPS capsule, TAKE 1 CAPSULE BY MOUTH EVERY 7 DAYS **NOT COVERED**, Disp: 12 capsule, Rfl: 0   No Known Allergies   Review of Systems  Constitutional: Negative.   HENT: Negative.    Eyes: Negative.   Respiratory: Negative.    Cardiovascular: Negative.   Gastrointestinal: Negative.   Endocrine: Negative.   Genitourinary: Negative.   Musculoskeletal: Negative.   Skin: Negative.   Allergic/Immunologic: Negative.   Neurological: Negative.   Hematological: Negative.   Psychiatric/Behavioral: Negative.       Today's Vitals   04/03/22 1532  BP: 130/64  Pulse: 84  Temp: 98.7 F (37.1 C)  TempSrc: Oral  Weight: 208 lb (94.3 kg)  Height: _0  (1.676 m)  PainSc: 7    Body mass index is 33.57 kg/m.   Objective:  Physical Exam Vitals reviewed.  Constitutional:      General: She is not  in acute distress.    Appearance: Normal appearance. She is obese.  Cardiovascular:     Rate and Rhythm: Normal rate and regular rhythm.     Pulses: Normal pulses.     Heart sounds: Normal heart sounds. No murmur heard. Pulmonary:     Effort: Pulmonary effort is normal.  No respiratory distress.     Breath sounds: Normal breath sounds. No wheezing.  Abdominal:     Tenderness: There is left CVA tenderness. There is no right CVA tenderness.  Neurological:     General: No focal deficit present.     Mental Status: She is alert and oriented to person, place, and time.     Cranial Nerves: No cranial nerve deficit.     Motor: No weakness.  Psychiatric:        Mood and Affect: Mood normal.        Behavior: Behavior normal.        Thought Content: Thought content normal.        Judgment: Judgment normal.         Assessment And Plan:     1. Flank pain Comments: positive CVA tenderness to left flank.  - POCT Urinalysis Dipstick (00511) - US Renal; Future - sulfamethoxazole-trimethoprim (BACTRIM DS) 800-160 MG tablet; Take 1 tablet by mouth 2 (two) times daily.  Dispense: 28 tablet; Refill: 0  2. Need for vaccination - hpv 9-valent vaccine (GARDASIL 9) SUSP injection; Inject 0.5 mLs into the muscle once for 1 dose.  Dispense: 0.5 mL; Refill: 0  3. Leukocytes in urine Comments: Urine culture sent to lab, continue to increase water intake at least 1/2 body weight in ozs.     Patient was given opportunity to ask questions. Patient verbalized understanding of the plan and was able to repeat key elements of the plan. All questions were answered to their satisfaction.  [name], FNP   I, [name], FNP, have reviewed all documentation for this visit. The documentation on 04/03/22 for the exam, diagnosis, procedures, and orders are all accurate and complete.   IF YOU HAVE BEEN REFERRED TO A SPECIALIST, IT MAY TAKE 1-2 WEEKS TO SCHEDULE/PROCESS THE REFERRAL. IF YOU HAVE NOT HEARD FROM  US/SPECIALIST IN TWO WEEKS, PLEASE GIVE [place] A CALL AT [phone].   THE PATIENT IS ENCOURAGED TO PRACTICE SOCIAL DISTANCING DUE TO THE COVID-19 PANDEMIC.

## 2022-04-03 NOTE — Progress Notes (Signed)
Lupus anticoagulant is not detected. Anti-CCP negative.

## 2022-04-04 ENCOUNTER — Ambulatory Visit
Admission: RE | Admit: 2022-04-04 | Discharge: 2022-04-04 | Disposition: A | Discharge status: Home / Self Care | Payer: No Typology Code available for payment source | Source: Ambulatory Visit | Attending: Nurse Practitioner | Admitting: Nurse Practitioner

## 2022-04-04 DIAGNOSIS — R109 Unspecified abdominal pain: Secondary | ICD-10-CM

## 2022-04-05 ENCOUNTER — Other Ambulatory Visit: Payer: Self-pay | Admitting: Nurse Practitioner

## 2022-04-05 DIAGNOSIS — N3000 Acute cystitis without hematuria: Secondary | ICD-10-CM

## 2022-04-05 LAB — URINE CULTURE

## 2022-04-05 MED ORDER — AMOXICILLIN 875 MG PO TABS: 875.0000 mg | ORAL_TABLET | Freq: Two times a day (BID) | ORAL | 0 refills | Status: DC

## 2022-04-07 LAB — CARDIOLIPIN ANTIBODIES, IGG, IGM, IGA
Anticardiolipin IgA: <2 APL-U/mL (ref <20.0)
Anticardiolipin IgG: <2 GPL-U/mL (ref <20.0)
Anticardiolipin IgM: 3.1 MPL-U/mL (ref <20.0)

## 2022-04-07 LAB — COMPLETE METABOLIC PANEL WITH GFR
AG Ratio: 1.7 (calc) (ref 1.0–2.5)
ALT: 15 U/L (ref 6–29)
AST: 17 U/L (ref 10–35)
Albumin: 4.7 g/dL (ref 3.6–5.1)
Alkaline phosphatase (APISO): 130 U/L — ABNORMAL HIGH (ref 31–125)
BUN: 9 mg/dL (ref 7–25)
CO2: 20 mmol/L (ref 20–32)
Calcium: 10.1 mg/dL (ref 8.6–10.2)
Chloride: 107 mmol/L (ref 98–110)
Creat: 0.7 mg/dL (ref 0.50–0.99)
Globulin: 2.7 g/dL (calc) (ref 1.9–3.7)
Glucose, Bld: 117 mg/dL — ABNORMAL HIGH (ref 65–99)
Potassium: 4.4 mmol/L (ref 3.5–5.3)
Sodium: 139 mmol/L (ref 135–146)
Total Bilirubin: 0.3 mg/dL (ref 0.2–1.2)
Total Protein: 7.4 g/dL (ref 6.1–8.1)
eGFR: 109 mL/min/{1.73_m2} (ref >=60)

## 2022-04-07 LAB — ANA: Anti Nuclear Antibody (ANA): POSITIVE — AB

## 2022-04-07 LAB — URINALYSIS, ROUTINE W REFLEX MICROSCOPIC
Bilirubin Urine: NEGATIVE
Glucose, UA: NEGATIVE
Hgb urine dipstick: NEGATIVE
Ketones, ur: NEGATIVE
Leukocytes,Ua: NEGATIVE
Nitrite: NEGATIVE
Protein, ur: NEGATIVE
Specific Gravity, Urine: 1.031 (ref 1.001–1.035)
pH: <=5 (ref 5.0–8.0)

## 2022-04-07 LAB — ANTI-SMITH ANTIBODY: ENA SM Ab Ser-aCnc: <1 AI

## 2022-04-07 LAB — BETA-2 GLYCOPROTEIN ANTIBODIES
Beta-2 Glyco 1 IgA: <2 U/mL (ref <20.0)
Beta-2 Glyco 1 IgM: 3.6 U/mL (ref <20.0)
Beta-2 Glyco I IgG: <2 U/mL (ref <20.0)

## 2022-04-07 LAB — ANTI-NUCLEAR AB-TITER (ANA TITER): ANA Titer 1: 1:80 {titer} — ABNORMAL HIGH

## 2022-04-07 LAB — SJOGRENS SYNDROME-A EXTRACTABLE NUCLEAR ANTIBODY: SSA (Ro) (ENA) Antibody, IgG: <1 AI

## 2022-04-07 LAB — ANTI-DNA ANTIBODY, DOUBLE-STRANDED: ds DNA Ab: <1 IU/mL

## 2022-04-07 LAB — C3 AND C4
C3 Complement: 185 mg/dL (ref 83–193)
C4 Complement: 34 mg/dL (ref 15–57)

## 2022-04-07 LAB — SJOGRENS SYNDROME-B EXTRACTABLE NUCLEAR ANTIBODY: SSB (La) (ENA) Antibody, IgG: <1 AI

## 2022-04-07 LAB — RFLX HEXAGONAL PHASE CONFIRM: Hexagonal Phase Conf: NEGATIVE

## 2022-04-07 LAB — RHEUMATOID FACTOR: Rheumatoid fact SerPl-aCnc: <14 IU/mL (ref <14)

## 2022-04-07 LAB — LUPUS ANTICOAGULANT EVAL W/ REFLEX
PTT-LA Screen: 41 s — ABNORMAL HIGH (ref <=40)
dRVVT: 42 s (ref <=45)

## 2022-04-07 LAB — 14-3-3 ETA PROTEIN: 14-3-3 eta Protein: <0.2 ng/mL (ref <0.2)

## 2022-04-07 LAB — RNP ANTIBODY: Ribonucleic Protein(ENA) Antibody, IgG: <1 AI

## 2022-04-07 LAB — CYCLIC CITRUL PEPTIDE ANTIBODY, IGG: Cyclic Citrullin Peptide Ab: <16 UNITS

## 2022-04-07 LAB — ANTI-SCLERODERMA ANTIBODY: Scleroderma (Scl-70) (ENA) Antibody, IgG: <1 AI

## 2022-04-08 NOTE — Progress Notes (Signed)
ANA is low titer positive which is not a significant value.All other autoimmune labs are negative.

## 2022-04-10 ENCOUNTER — Ambulatory Visit: Payer: No Typology Code available for payment source | Admitting: Nurse Practitioner

## 2022-04-17 ENCOUNTER — Encounter: Payer: Self-pay | Admitting: Nurse Practitioner

## 2022-04-17 ENCOUNTER — Encounter: Payer: Self-pay | Admitting: Neurology

## 2022-04-19 ENCOUNTER — Other Ambulatory Visit: Payer: Self-pay | Admitting: Nurse Practitioner

## 2022-04-19 ENCOUNTER — Encounter: Payer: Self-pay | Admitting: Nurse Practitioner

## 2022-04-19 DIAGNOSIS — N3000 Acute cystitis without hematuria: Secondary | ICD-10-CM

## 2022-04-25 ENCOUNTER — Other Ambulatory Visit: Payer: Self-pay | Admitting: Neurology

## 2022-04-25 ENCOUNTER — Telehealth: Payer: Self-pay

## 2022-04-25 MED ORDER — REYVOW 100 MG PO TABS: 1.0000 | ORAL_TABLET | Freq: Every day | ORAL | 5 refills | Status: DC | PRN

## 2022-04-25 NOTE — Telephone Encounter (Signed)
Spoke to patient, Patient needs her form to say she need to be off one or two days for her migraines she is unable to focus, use her computer due to her needing to be ina dark room during her migraines.   Patient needs a refill on [facility].

## 2022-05-01 ENCOUNTER — Other Ambulatory Visit: Payer: Self-pay | Admitting: Neurology

## 2022-05-03 ENCOUNTER — Other Ambulatory Visit: Payer: No Typology Code available for payment source

## 2022-05-04 ENCOUNTER — Other Ambulatory Visit: Payer: Self-pay

## 2022-05-04 DIAGNOSIS — R82998 Other abnormal findings in urine: Secondary | ICD-10-CM

## 2022-05-05 LAB — URINE CULTURE

## 2022-05-09 ENCOUNTER — Encounter: Payer: Self-pay | Admitting: Nurse Practitioner

## 2022-05-09 ENCOUNTER — Ambulatory Visit (INDEPENDENT_AMBULATORY_CARE_PROVIDER_SITE_OTHER): Payer: No Typology Code available for payment source | Admitting: Nurse Practitioner

## 2022-05-09 VITALS — BP 108/72 | HR 100 | Temp 98.1°F | Ht 66.0 in | Wt 205.6 lb

## 2022-05-09 DIAGNOSIS — E119 Type 2 diabetes mellitus without complications: Secondary | ICD-10-CM

## 2022-05-09 DIAGNOSIS — M549 Dorsalgia, unspecified: Secondary | ICD-10-CM | POA: Diagnosis not present

## 2022-05-09 DIAGNOSIS — Z6833 Body mass index (BMI) 33.0-33.9, adult: Secondary | ICD-10-CM

## 2022-05-09 DIAGNOSIS — E1169 Type 2 diabetes mellitus with other specified complication: Secondary | ICD-10-CM | POA: Diagnosis not present

## 2022-05-09 DIAGNOSIS — Z23 Encounter for immunization: Secondary | ICD-10-CM | POA: Diagnosis not present

## 2022-05-09 DIAGNOSIS — G8929 Other chronic pain: Secondary | ICD-10-CM

## 2022-05-09 DIAGNOSIS — I10 Essential (primary) hypertension: Secondary | ICD-10-CM

## 2022-05-09 DIAGNOSIS — R9389 Abnormal findings on diagnostic imaging of other specified body structures: Secondary | ICD-10-CM

## 2022-05-09 DIAGNOSIS — E6609 Other obesity due to excess calories: Secondary | ICD-10-CM

## 2022-05-09 NOTE — Progress Notes (Signed)
I,Victoria T Hamilton,acting as a Education administrator for [name], FNP.,have documented all relevant documentation on the behalf of [name], FNP,as directed by  [name], FNP while in the presence of [name], [place].    Subjective:     Patient ID: Virginia Douglas , female    DOB: 11-02-1975 , 46 y.o.   MRN: [id number]   Chief Complaint  Patient presents with   Diabetes   Hypertension    HPI  The patient is here today for a dm & blood pressure f/u. She has no other specific questions or concerns.  Wt Readings from Last 3 Encounters: 05/09/22 : 205 lb 9.6 oz (93.3 kg) 04/03/22 : 208 lb (94.3 kg) 03/23/22 : 205 lb 12.8 oz (93.4 kg)  Letter sent for colonoscopy & dm eye exam. She had her AVM repair done, had some bleeding which has stopped. She will f/u at [facility] in 6 months. She is currently at work. Continues to have left flank pain intermittently. Not consistent.      Diabetes She presents for her follow-up diabetic visit. She has type 2 diabetes mellitus. There are no hypoglycemic associated symptoms. There are no diabetic associated symptoms. There are no hypoglycemic complications. Risk factors for coronary artery disease include obesity and sedentary lifestyle. Current diabetic treatment includes oral agent (dual therapy) and oral agent (monotherapy). She is compliant with treatment most of the time. She has not had a previous visit with a dietitian. She rarely participates in exercise.  Hypertension This is a chronic problem. The current episode started more than 1 year ago. The problem is unchanged. The problem is controlled. There are no associated agents to hypertension. Risk factors for coronary artery disease include obesity and sedentary lifestyle. Past treatments include diuretics and ACE inhibitors. The current treatment provides significant improvement. Compliance problems include exercise (unable due to back pain).  There is no history of chronic renal disease.   Insomnia Primary symptoms: fragmented sleep, frequent awakening.   The current episode started more than one month. The onset quality is sudden. The problem occurs nightly. The problem has been gradually worsening since onset. The symptoms are aggravated by family issues (tragic death of her mother). PMH includes: no hypertension.      Past Medical History:  Diagnosis Date   ADHD (attention deficit hyperactivity disorder)    Anemia    iron deficiency   as a teenager   Anxiety    Arthritis    Diabetes ([place])    Edema    GERD (gastroesophageal reflux disease)    Hypertension    patient denies- patient stated that the PCP said it was for heart prevention.   Migraine headache      Family History  Problem Relation Age of Onset   Diabetes Mother    Hypertension Mother    Diabetes Father    Hypertension Father    Kidney failure Father    Hypertension Brother    Cancer Maternal Grandmother    Diabetes Maternal Grandmother    Pancreatic cancer Maternal Grandmother    Diabetes Paternal Grandmother    Breast cancer Paternal Grandmother    Stroke Paternal Grandfather    Healthy Son    Healthy Son    Cancer Maternal Aunt        breast   Breast cancer Maternal Aunt      Current Outpatient Medications:    acetaZOLAMIDE (DIAMOX) 250 MG tablet, TAKE 1 TABLET BY MOUTH TWICE A DAY, Disp: 60 tablet, Rfl: 2  ADDERALL XR 30 MG 24 hr capsule, Take 30 mg by mouth daily. (Patient not taking: Reported on 05/12/2022), Disp: , Rfl:    AIMOVIG 140 MG/ML SOAJ, INJECT 140 MG INTO THE SKIN EVERY 30 DAYS, Disp: 1 mL, Rfl: 0   amoxicillin (AMOXIL) 875 MG tablet, Take 1 tablet (875 mg total) by mouth 2 (two) times daily., Disp: 14 tablet, Rfl: 0   aspirin EC 81 MG tablet, Take 81 mg by mouth daily. Swallow whole., Disp: , Rfl:    atorvastatin (LIPITOR) 10 MG tablet, TAKE 1 TABLET BY MOUTH EVERY DAY, Disp: 90 tablet, Rfl: 1   Blood Glucose Monitoring Suppl (ONETOUCH VERIO IQ SYSTEM) w/Device KIT, by  Does not apply route. Use to check blood sugars twice, Disp: , Rfl:    Boric Acid Vaginal (AZO BORIC ACID) 600 MG SUPP, Place 1 suppository vaginally at bedtime., Disp: 14 suppository, Rfl: 0   buPROPion (WELLBUTRIN XL) 150 MG 24 hr tablet, TAKE 1 TABLET BY MOUTH EVERY DAY IN THE MORNING, Disp: 90 tablet, Rfl: 1   cetirizine (ZYRTEC) 10 MG tablet, Take 10 mg by mouth daily as needed for allergies., Disp: , Rfl:    chlorhexidine (PERIDEX) 0.12 % solution, 5 mLs by Mouth Rinse route daily., Disp: , Rfl:    clonazePAM (KLONOPIN) 0.5 MG tablet, TAKE 1 TABLET BY MOUTH THREE TIMES A DAY AS NEEDED FOR ANXIETY, Disp: 30 tablet, Rfl: 1   clopidogrel (PLAVIX) 75 MG tablet, Take 1 tablet (75 mg total) by mouth daily., Disp: 90 tablet, Rfl: 2   Dulaglutide (TRULICITY) 1.5 FB/5.1WC SOPN, Inject 1.5 mg into the skin once a week., Disp: 6 mL, Rfl: 1   DULoxetine (CYMBALTA) 60 MG capsule, TAKE 1 CAPSULE BY MOUTH EVERY DAY, Disp: 90 capsule, Rfl: 0   famotidine (PEPCID) 20 MG tablet, Take 20 mg by mouth daily., Disp: , Rfl:    glucose blood test strip, 1 each by Other route as needed for other. Insert 1 by subcutaneous route 2 times every day check blood sugar before breakfast and dinner, Disp: , Rfl:    hydrOXYzine (VISTARIL) 50 MG capsule, TAKE 1-2 CAPSULES BY MOUTH AS NEEDED FOR SLEEP AT BEDTIME, Disp: 180 capsule, Rfl: 1   Insulin Pen Needle (PEN NEEDLES) 32G X 4 MM MISC, 1 each by Does not apply route once a week., Disp: 30 each, Rfl: 3   Lasmiditan Succinate (REYVOW) 100 MG TABS, Take 1 tablet by mouth daily as needed., Disp: 16 tablet, Rfl: 5   lisinopril-hydrochlorothiazide (ZESTORETIC) 10-12.5 MG tablet, TAKE 1 TABLET BY MOUTH EVERY DAY, Disp: 90 tablet, Rfl: 1   omeprazole (PRILOSEC) 40 MG capsule, Take 40 mg by mouth daily., Disp: , Rfl:    ondansetron (ZOFRAN-ODT) 4 MG disintegrating tablet, 4 mg daily as needed for nausea or vomiting., Disp: , Rfl:    phentermine 30 MG capsule, Take 1 capsule (30 mg  total) by mouth every morning., Disp: 30 capsule, Rfl: 1   Polyethyl Glycol-Propyl Glycol (SYSTANE) 0.4-0.3 % SOLN, Place 1 drop into both eyes daily., Disp: , Rfl:    saccharomyces boulardii (FLORASTOR) 250 MG capsule, Take 250 mg by mouth daily. gummy, Disp: , Rfl:    tiZANidine (ZANAFLEX) 4 MG tablet, Take 4 mg by mouth daily as needed for muscle spasms., Disp: , Rfl:    topiramate (TOPAMAX) 50 MG tablet, TAKE 2 AND 1/2 TABLETS (125 MG TOTAL) BY MOUTH AT BEDTIME., Disp: 225 tablet, Rfl: 0   Vitamin D, Ergocalciferol, (DRISDOL) 1.25 MG (  50000 UNIT) CAPS capsule, TAKE 1 CAPSULE BY MOUTH EVERY 7 DAYS **NOT COVERED**, Disp: 12 capsule, Rfl: 0   linaclotide (LINZESS) 72 MCG capsule, Take 72 mcg by mouth daily as needed (Constipation)., Disp: , Rfl:    No Known Allergies   Review of Systems  Constitutional: Negative.   Respiratory: Negative.    Cardiovascular: Negative.   Neurological: Negative.   Psychiatric/Behavioral: Negative.  The patient has insomnia.      Today's Vitals   05/09/22 1609  BP: 108/72  Pulse: 100  Temp: 98.1 F (36.7 C)  SpO2: 98%  Weight: 205 lb 9.6 oz (93.3 kg)  Height: _0  (1.676 m)   Body mass index is 33.18 kg/m.  Wt Readings from Last 3 Encounters:  05/12/22 208 lb 3.2 oz (94.4 kg)  05/09/22 205 lb 9.6 oz (93.3 kg)  04/03/22 208 lb (94.3 kg)    Objective:  Physical Exam Vitals reviewed.  Constitutional:      General: She is not in acute distress.    Appearance: Normal appearance. She is well-developed. She is obese.  HENT:     Head: Normocephalic and atraumatic.  Eyes:     Pupils: Pupils are equal, round, and reactive to light.  Cardiovascular:     Rate and Rhythm: Normal rate and regular rhythm.     Pulses: Normal pulses.     Heart sounds: Normal heart sounds. No murmur heard. Pulmonary:     Effort: Pulmonary effort is normal. No respiratory distress.     Breath sounds: Normal breath sounds. No wheezing.  Musculoskeletal:        General:  No tenderness.  Skin:    General: Skin is warm and dry.     Capillary Refill: Capillary refill takes less than 2 seconds.  Neurological:     General: No focal deficit present.     Mental Status: She is alert and oriented to person, place, and time.     Cranial Nerves: No cranial nerve deficit.     Motor: No weakness.  Psychiatric:        Mood and Affect: Mood normal.        Behavior: Behavior normal.        Thought Content: Thought content normal.        Judgment: Judgment normal.         Assessment And Plan:     1. Type 2 diabetes mellitus without complication, without long-term current use of insulin (HCC) Comments: HgbA1c is improving, continue current medications  2. Essential hypertension Comments: Blood pressure is well controlled, continue current medications  3. Other chronic back pain Comments: Renal ultrasound done with bilateral hydronephrosis, may need a referral to Urology  4. Class 1 obesity due to excess calories without serious comorbidity with body mass index (BMI) of 33.0 to 33.9 in adult She is encouraged to strive for BMI less than 30 to decrease cardiac risk. Advised to aim for at least 150 minutes of exercise per week.  5. Immunization due Influenza vaccine administered Encouraged to take Tylenol as needed for fever or muscle aches. - Flu Vaccine QUAD 6+ mos PF IM (Fluarix Quad PF)     Patient was given opportunity to ask questions. Patient verbalized understanding of the plan and was able to repeat key elements of the plan. All questions were answered to their satisfaction.  [name], FNP   I, [name], FNP, have reviewed all documentation for this visit. The documentation on 05/09/22 for the exam, diagnosis,  procedures, and orders are all accurate and complete.   IF YOU HAVE BEEN REFERRED TO A SPECIALIST, IT MAY TAKE 1-2 WEEKS TO SCHEDULE/PROCESS THE REFERRAL. IF YOU HAVE NOT HEARD FROM US/SPECIALIST IN TWO WEEKS, PLEASE GIVE [place] A CALL AT  [phone].   THE PATIENT IS ENCOURAGED TO PRACTICE SOCIAL DISTANCING DUE TO THE COVID-19 PANDEMIC.

## 2022-05-09 NOTE — Patient Instructions (Addendum)
Hypertension, Adult Hypertension is another name for high blood pressure. High blood pressure forces your heart to work harder to pump blood. This can cause problems over time. There are two numbers in a blood pressure reading. There is a top number (systolic) over a bottom number (diastolic). It is best to have a blood pressure that is below 120/80. What are the causes? The cause of this condition is not known. Some other conditions can lead to high blood pressure. What increases the risk? Some lifestyle factors can make you more likely to develop high blood pressure: Smoking. Not getting enough exercise or physical activity. Being overweight. Having too much fat, sugar, calories, or salt (sodium) in your diet. Drinking too much alcohol. Other risk factors include: Having any of these conditions: Heart disease. Diabetes. High cholesterol. Kidney disease. Obstructive sleep apnea. Having a family history of high blood pressure and high cholesterol. Age. The risk increases with age. Stress. What are the signs or symptoms? High blood pressure may not cause symptoms. Very high blood pressure (hypertensive crisis) may cause: Headache. Fast or uneven heartbeats (palpitations). Shortness of breath. Nosebleed. Vomiting or feeling like you may vomit (nauseous). Changes in how you see. Very bad chest pain. Feeling dizzy. Seizures. How is this treated? This condition is treated by making healthy lifestyle changes, such as: Eating healthy foods. Exercising more. Drinking less alcohol. Your doctor may prescribe medicine if lifestyle changes do not help enough and if: Your top number is above 130. Your bottom number is above 80. Your personal target blood pressure may vary. Follow these instructions at home: Eating and drinking  If told, follow the DASH eating plan. To follow this plan: Fill one half of your plate at each meal with fruits and vegetables. Fill one fourth of your plate  at each meal with whole grains. Whole grains include whole-wheat pasta, brown rice, and whole-grain bread. Eat or drink low-fat dairy products, such as skim milk or low-fat yogurt. Fill one fourth of your plate at each meal with low-fat (lean) proteins. Low-fat proteins include fish, chicken without skin, eggs, beans, and tofu. Avoid fatty meat, cured and processed meat, or chicken with skin. Avoid pre-made or processed food. Limit the amount of salt in your diet to less than 1,500 mg each day. Do not drink alcohol if: Your doctor tells you not to drink. You are pregnant, may be pregnant, or are planning to become pregnant. If you drink alcohol: Limit how much you have to: 0-1 drink a day for women. 0-2 drinks a day for men. Know how much alcohol is in your drink. In the [place] one drink equals one 12 oz bottle of beer (355 mL), one 5 oz glass of wine (148 mL), or one 1 oz glass of hard liquor (44 mL). Lifestyle  Work with your doctor to stay at a healthy weight or to lose weight. Ask your doctor what the best weight is for you. Get at least 30 minutes of exercise that causes your heart to beat faster (aerobic exercise) most days of the week. This may include walking, swimming, or biking. Get at least 30 minutes of exercise that strengthens your muscles (resistance exercise) at least 3 days a week. This may include lifting weights or doing Pilates. Do not smoke or use any products that contain nicotine or tobacco. If you need help quitting, ask your doctor. Check your blood pressure at home as told by your doctor. Keep all follow-up visits. Medicines Take over-the-counter and prescription medicines  only as told by your doctor. Follow directions carefully. Do not skip doses of blood pressure medicine. The medicine does not work as well if you skip doses. Skipping doses also puts you at risk for problems. Ask your doctor about side effects or reactions to medicines that you should watch  for. Contact a doctor if: You think you are having a reaction to the medicine you are taking. You have headaches that keep coming back. You feel dizzy. You have swelling in your ankles. You have trouble with your vision. Get help right away if: You get a very bad headache. You start to feel mixed up (confused). You feel weak or numb. You feel faint. You have very bad pain in your: Chest. Belly (abdomen). You vomit more than once. You have trouble breathing. These symptoms may be an emergency. Get help right away. Call 911. Do not wait to see if the symptoms will go away. Do not drive yourself to the hospital. Summary Hypertension is another name for high blood pressure. High blood pressure forces your heart to work harder to pump blood. For most people, a normal blood pressure is less than 120/80. Making healthy choices can help lower blood pressure. If your blood pressure does not get lower with healthy choices, you may need to take medicine. This information is not intended to replace advice given to you by your health care provider. Make sure you discuss any questions you have with your health care provider. Document Revised: 06/09/2021 Document Reviewed: 06/09/2021 Elsevier Patient Education  [place].  Type 2 Diabetes Mellitus, Diagnosis, Adult Type 2 diabetes (type 2 diabetes mellitus) is a long-term (chronic) disease. It may happen when there is one or both of these problems: The pancreas does not make enough insulin. The body does not react in a normal way to insulin that it makes. Insulin lets sugars go into cells in your body. If you have type 2 diabetes, sugars cannot get into your cells. Sugars build up in the blood. This causes high blood sugar. What are the causes? The exact cause of this condition is not known. What increases the risk? Having type 2 diabetes in your family. Being overweight or very overweight. Not being active. Your body not reacting  in a normal way to the insulin it makes. Having higher than normal blood sugar over time. Having a type of diabetes when you were pregnant. Having a condition that causes small fluid-filled sacs on your ovaries. What are the signs or symptoms? At first, you may have no symptoms. You will get symptoms slowly. They may include: More thirst than normal. More hunger than normal. Needing to pee more than normal. Losing weight without trying. Feeling tired. Feeling weak. Seeing things blurry. Dark patches on your skin. How is this treated? This condition may be treated by a diabetes expert. You may need to: Follow an eating plan made by a food expert (dietitian). Get regular exercise. Find ways to deal with stress. Check blood sugar as often as told. Take medicines. Your doctor will set treatment goals for you. Your blood sugar should be at these levels: Before meals: 80-130 mg/dL (4.4-7.2 mmol/L). After meals: below 180 mg/dL (10 mmol/L). Over the last 2-3 months: less than 7%. Follow these instructions at home: Medicines Take your diabetes medicines or insulin every day. Take medicines as told to help you prevent other problems caused by this condition. You may need: Aspirin. Medicine to lower cholesterol. Medicine to control blood pressure. Questions to  ask your doctor Should I meet with a diabetes educator? What medicines do I need, and when should I take them? What will I need to treat my condition at home? When should I check my blood sugar? Where can I find a support group? Who can I call if I have questions? When is my next doctor visit? General instructions Take over-the-counter and prescription medicines only as told by your doctor. Keep all follow-up visits. Where to find more information For help and guidance and more information about diabetes, please go to: American Diabetes Association (ADA): www.diabetes.org American Association of Diabetes Care and Education  Specialists (ADCES): www.diabeteseducator.org International Diabetes Federation (IDF): MemberVerification.ca Contact a doctor if: Your blood sugar is at or above 240 mg/dL (13.3 mmol/L) for 2 days in a row. You have been sick for 2 days or more, and you are not getting better. You have had a fever for 2 days or more, and you are not getting better. You have any of these problems for more than 6 hours: You cannot eat or drink. You feel like you may vomit. You vomit. You have watery poop (diarrhea). Get help right away if: Your blood sugar is lower than 54 mg/dL (3 mmol/L). You feel mixed up (confused). You have trouble thinking clearly. You have trouble breathing. You have medium or large ketone levels in your pee. These symptoms may be an emergency. Get help right away. Call your local emergency services (911 in the [place] Do not wait to see if the symptoms will go away. Do not drive yourself to the hospital. Summary Type 2 diabetes is a long-term disease. Your pancreas may not make enough insulin, or your body may not react in a normal way to insulin that it makes. This condition is treated with an eating plan, lifestyle changes, and medicines. Your doctor will set treatment goals for you. These will help you keep your blood sugar in a healthy range. Keep all follow-up visits. This information is not intended to replace advice given to you by your health care provider. Make sure you discuss any questions you have with your health care provider. Document Revised: 11/15/2020 Document Reviewed: 11/15/2020 Elsevier Patient Education  [place].  Influenza (Flu) Vaccine (Inactivated or Recombinant): What You Need to Know 1. Why get vaccinated? Influenza vaccine can prevent influenza (flu). Flu is a contagious disease that spreads around the [place] every year, usually between October and May. Anyone can get the flu, but it is more dangerous for some people. Infants and young  children, people 62 years and older, pregnant people, and people with certain health conditions or a weakened immune system are at greatest risk of flu complications. Pneumonia, bronchitis, sinus infections, and ear infections are examples of flu-related complications. If you have a medical condition, such as heart disease, cancer, or diabetes, flu can make it worse. Flu can cause fever and chills, sore throat, muscle aches, fatigue, cough, headache, and runny or stuffy nose. Some people may have vomiting and diarrhea, though this is more common in children than adults. In an average year, thousands of people in the [place] die from flu, and many more are hospitalized. Flu vaccine prevents millions of illnesses and flu-related visits to the doctor each year. 2. Influenza vaccines CDC recommends everyone 6 months and older get vaccinated every flu season. Children 6 months through 24 years of age may need 2 doses during a single flu season. Everyone else needs only 1 dose each flu season.  It takes about 2 weeks for protection to develop after vaccination. There are many flu viruses, and they are always changing. Each year a new flu vaccine is made to protect against the influenza viruses believed to be likely to cause disease in the upcoming flu season. Even when the vaccine doesn't exactly match these viruses, it may still provide some protection. Influenza vaccine does not cause flu. Influenza vaccine may be given at the same time as other vaccines. 3. Talk with your health care provider Tell your vaccination provider if the person getting the vaccine: Has had an allergic reaction after a previous dose of influenza vaccine, or has any severe, life-threatening allergies Has ever had Guillain-Barr Syndrome (also called "GBS") In some cases, your health care provider may decide to postpone influenza vaccination until a future visit. Influenza vaccine can be administered at any time during  pregnancy. People who are or will be pregnant during influenza season should receive inactivated influenza vaccine. People with minor illnesses, such as a cold, may be vaccinated. People who are moderately or severely ill should usually wait until they recover before getting influenza vaccine. Your health care provider can give you more information. 4. Risks of a vaccine reaction Soreness, redness, and swelling where the shot is given, fever, muscle aches, and headache can happen after influenza vaccination. There may be a very small increased risk of Guillain-Barr Syndrome (GBS) after inactivated influenza vaccine (the flu shot). Young children who get the flu shot along with pneumococcal vaccine (PCV13) and/or DTaP vaccine at the same time might be slightly more likely to have a seizure caused by fever. Tell your health care provider if a child who is getting flu vaccine has ever had a seizure. People sometimes faint after medical procedures, including vaccination. Tell your provider if you feel dizzy or have vision changes or ringing in the ears. As with any medicine, there is a very remote chance of a vaccine causing a severe allergic reaction, other serious injury, or death. 5. What if there is a serious problem? An allergic reaction could occur after the vaccinated person leaves the clinic. If you see signs of a severe allergic reaction (hives, swelling of the face and throat, difficulty breathing, a fast heartbeat, dizziness, or weakness), call 9-1-1 and get the person to the nearest hospital. For other signs that concern you, call your health care provider. Adverse reactions should be reported to the Vaccine Adverse Event Reporting System (VAERS). Your health care provider will usually file this report, or you can do it yourself. Visit the VAERS website at www.vaers.SamedayNews.es or call 817-226-4919. VAERS is only for reporting reactions, and VAERS staff members do not give medical advice. 6. The  National Vaccine Injury Compensation Program The [employer] Vaccine Injury Compensation Program (VICP) is a federal program that was created to compensate people who may have been injured by certain vaccines. Claims regarding alleged injury or death due to vaccination have a time limit for filing, which may be as short as two years. Visit the VICP website at GoldCloset.com.ee or call 623-400-9005 to learn about the program and about filing a claim. 7. How can I learn more? Ask your health care provider. Call your local or state health department. Visit the website of the Food and Drug Administration (FDA) for vaccine package inserts and additional information at TraderRating.uy. Contact the Centers for Disease Control and Prevention (CDC): Call 810-551-6317 (1-800-CDC-INFO) or Visit CDC's website at https://gibson.com/. Source: CDC Vaccine Information Statement Inactivated Influenza Vaccine (04/09/2020) This  same material is available at http://www.wolf.info/ for no charge. This information is not intended to replace advice given to you by your health care provider. Make sure you discuss any questions you have with your health care provider. Document Revised: 07/20/2021 Document Reviewed: 05/12/2021 Elsevier Patient Education  [place].

## 2022-05-10 NOTE — Progress Notes (Signed)
NEUROLOGY FOLLOW UP OFFICE NOTE  Virginia Douglas [id number]  Assessment/Plan:   Migraine without aura, without status migrainosus, not intractable Bilateral sigmoid sinus stenosis s/p stent on left, plan for right as well Left PCA AVM History of idiopathic intracranial hypertension Questionable myoclonus?  Possibly related to duloxetine   Migraine prevention:  Aimovig 166m, topiramate1252mdaily Migraine rescue:  Will have her try Nurtec IIH therapy:  acetazolamide 25027mID. Patient requests to remain on acetazolamide despite already taking topiramate and eye exams without papilledema.  Serum bicarb levels typically borderline low (19-20).  Continue to monitor. Neuropathic pain: Cymbalta 78m92mily Limit use of pain relievers to no more than 2 days out of week to prevent risk of rebound or medication-overuse headache. Keep headache diary Follow up with me 6 months     Subjective:  Virginia Douglas 46 y49r old right-handed woman with diabetes and hypertension who follows up for migraines.   UPDATE: Underwent stereotactic surgery of the cerebral AVM at [facility] on 8/28.    Intensity:  severe Duration:  30 to 60 minutes with Reyvow but no longer covered by insurance. Frequency:  once a week   Current NSAIDS:  none Current analgesics:  none Other current abortive:  none Current triptans: none Current ergotamine: None Current anti-emetic: Zofran 4 mg Current muscle relaxants: Robaxin Current anti-anxiolytic:  alprazolam Current sleep aide:  none Current Antihypertensive medications:  Lisinopril-HCTZ Current Antidepressant medications:  Cymbalta 78mg1mly Current Anticonvulsant medications: Topiramate 125mg 1medtime, acetazolamide 250mg t55m daily, Lyrica 100mg th49mtimes daily. Current anti-CGRP:  Aimovig 140mg Cur63m Vitamins/Herbal/Supplements:  turmeric Current Antihistamines/Decongestants:  none Other therapy:  Reyvow (rescue) Hormone/birth control:  no     Caffeine: One soda a week Diet: Hydrates Exercise: Yes Depression: Yes; Anxiety: Yes Other pain: No Sleep hygiene: Varies   HISTORY: Onset: Remote history of migraines.  Controlled for many years.  Returned in May 2019.. [place]land Kitchen:  Right sided head/ear radiating down right side of neck.  Does not radiate down the right arm. Quality:  Pounding in head, burning in neck Initial intensity:  Severe.  She denies  thunderclap headache or severe headache that wakes her from sleep. Aura:  no Prodrome:  no Postdrome:  no Associated symptoms:  Nausea, vomiting, photophobia, phonophobia, blurred vision.  She denies associated unilateral numbness or weakness. Initial duration:  1 day Initial Frequency:  2 days a week Initial Frequency of abortive medication: 2 days a week Triggers:  Emotional stress Relieving factors:  Heating pad, Robaxin Activity:  Aggravates   She was evaluated in the ED on 02/10/18, where CTA of head and neck was performed and personally reviewed.  It demonstrated empty sella but otherwise unremarkable for mass lesion, aneurysm or dissection.  She was diagnosed with idiopathic intracranial hypertension diagnosed in 2019.  She is on Diamox 250mg dail54mShe is followed by ophthalmology.  Exam note from 10/10/18 stated "maybe slight nasal nerve elevation" but no definite signs of papilledema.   She has longstanding history of numbness and tingling in the feet.  NCV-EMG of lower extremities in 2017 was normal.  She reports left sided low back pain radiating down the side of the left leg with associated numbness in the side of leg and foot since early 2020  She had a lumbar X-ray which demonstrated mild dextroscoliosis apex L4.  No injury.  She also has endorsed right sided leg pain as well.  MRI of lumbar spine was performed on 05/09/2020 which was personally reviewed and  showed mild lumbar spondylosis and degenerative disc disease with very mild bilateral foraminal stenosis at L5-S1.   She followed up with her PCP regarding right knee pain and swelling.  X-ray on 09/29/2020 was negative.  She reports generalized pain and numbness and tingling.  If she holds something, she may drop it.  Labs from 2021 include B12 388,  TSH  0.872, ANA negative, negative dsDNA, sed rate 13, CRP 10, RF negative.  Hgb A1c has increased over 2021 from 5.8 to 6.4.  Gabapentin previously used was ineffective for neuropathic pain.  She had a repeat NCV-EMG of the right upper and lower extremities on 01/04/2021, which was normal.    In June 2022, she woke up one morning and noted the right sided of her face was numb and mouth twisted.  She had difficulty drinking fluids and would drip out the side of her mouth.  She is not sure if she had trouble closing her eye or raising her eyebrow.  She had a slight headache but no facial pain, slurred speech, language difficulty or involvement of arm or leg.  It lasted about a week or a little bit longer. She had an MRI of the brain with and without contrast on 05/19/2021 which showed a small 8 mm enhancing vascular nodule within the left superior cerebellum.  She was referred to endovascular radiology.  Cerebral angiogram revealed a 3.4 mm x 4.3 mm AVM involving the left posterior cerebral artery P3 segment and high-grade left sigmoid sinus proximally and moderate to severe stenosis of right sigmoid sinus proximally.  She did endorse pulsatile tinnitus in the left ear.  She underwent endovascular revascularization of the high-grade stenosis of the left sigmoid sinus on 06/27/2021.  Headache and pulsatile tinnitus resolved.  MRV of head on 09/26/2021 personally reviewed showed stable and patent stent placement of the distal left transverse sinus and proximal sigmoid sinus stent placement.  She also noted pulsatile tinnitus in her right ear, so she underwent endovascular revascularization of the right transverse sinus sigmoid sinus junction stenosis on 10/28/2021.  Plan is to send her to  [facility] for evaluation and treatment of the AVM.  For the past couple of months, she reports that her body will suddenly jerk for a moment.  Happens on either side.  May involve shoulder/arm or leg.  No change in medication.     Past NSAIDS:  Ibuprofen, naproxen Past analgesics:  tramadol 69m Past abortive triptans: Sumatriptan 100 mg, rizatriptan 180m eletriptan 4011mast muscle relaxants:  Flexeril Past anti-emetic:  no Past antihypertensive medications:  no Past antidepressant medications:  nortriptyline 57m35maused nausea/vomiting, problems sleeping) Past anticonvulsant medications:  gabapentin 300mg41mce daily (for neuropathic pain) Past CGRP inhibitor:  Ajovy Past vitamins/Herbal/Supplements:  no Past antihistamines/decongestants:  no Other past therapies:  no   Family history of headache:  no   MRI of cervical spine from 09/22/15 was personally reviewed and was unremarkable.  PAST MEDICAL HISTORY: Past Medical History:  Diagnosis Date   ADHD (attention deficit hyperactivity disorder)    Anemia    iron deficiency   as a teenager   Anxiety    Arthritis    Diabetes (HCC) [place] [facility]    GERD (gastroesophageal reflux disease)    Hypertension    patient denies- patient stated that the PCP said it was for heart prevention.   Migraine headache     MEDICATIONS: Current Outpatient Medications on File Prior to Visit  Medication Sig Dispense Refill  acetaZOLAMIDE (DIAMOX) 250 MG tablet TAKE 1 TABLET BY MOUTH TWICE A DAY 60 tablet 2   ADDERALL XR 30 MG 24 hr capsule Take 30 mg by mouth daily.     AIMOVIG 140 MG/ML SOAJ INJECT 140 MG INTO THE SKIN EVERY 30 DAYS 1 mL 0   amoxicillin (AMOXIL) 875 MG tablet Take 1 tablet (875 mg total) by mouth 2 (two) times daily. 14 tablet 0   aspirin EC 81 MG tablet Take 81 mg by mouth daily. Swallow whole.     atorvastatin (LIPITOR) 10 MG tablet TAKE 1 TABLET BY MOUTH EVERY DAY 90 tablet 1   Blood Glucose Monitoring Suppl (ONETOUCH VERIO IQ  SYSTEM) w/Device KIT by Does not apply route. Use to check blood sugars twice     Boric Acid Vaginal (AZO BORIC ACID) 600 MG SUPP Place 1 suppository vaginally at bedtime. 14 suppository 0   buPROPion (WELLBUTRIN XL) 150 MG 24 hr tablet TAKE 1 TABLET BY MOUTH EVERY DAY IN THE MORNING 90 tablet 1   cetirizine (ZYRTEC) 10 MG tablet Take 10 mg by mouth daily as needed for allergies.     chlorhexidine (PERIDEX) 0.12 % solution 5 mLs by Mouth Rinse route daily.     clonazePAM (KLONOPIN) 0.5 MG tablet TAKE 1 TABLET BY MOUTH THREE TIMES A DAY AS NEEDED FOR ANXIETY 30 tablet 1   clopidogrel (PLAVIX) 75 MG tablet Take 1 tablet (75 mg total) by mouth daily. 90 tablet 2   Dulaglutide (TRULICITY) 1.5 QR/9.7JO SOPN Inject 1.5 mg into the skin once a week. 6 mL 1   DULoxetine (CYMBALTA) 60 MG capsule TAKE 1 CAPSULE BY MOUTH EVERY DAY 90 capsule 0   famotidine (PEPCID) 20 MG tablet Take 20 mg by mouth daily.     glucose blood test strip 1 each by Other route as needed for other. Insert 1 by subcutaneous route 2 times every day check blood sugar before breakfast and dinner     hydrOXYzine (VISTARIL) 50 MG capsule TAKE 1-2 CAPSULES BY MOUTH AS NEEDED FOR SLEEP AT BEDTIME 180 capsule 1   Insulin Pen Needle (PEN NEEDLES) 32G X 4 MM MISC 1 each by Does not apply route once a week. 30 each 3   Lasmiditan Succinate (REYVOW) 100 MG TABS Take 1 tablet by mouth daily as needed. 16 tablet 5   linaclotide (LINZESS) 72 MCG capsule Take 72 mcg by mouth daily as needed (Constipation). (Patient not taking: Reported on 05/09/2022)     lisinopril-hydrochlorothiazide (ZESTORETIC) 10-12.5 MG tablet TAKE 1 TABLET BY MOUTH EVERY DAY 90 tablet 1   omeprazole (PRILOSEC) 40 MG capsule Take 40 mg by mouth daily.     ondansetron (ZOFRAN-ODT) 4 MG disintegrating tablet 4 mg daily as needed for nausea or vomiting.     phentermine 30 MG capsule Take 1 capsule (30 mg total) by mouth every morning. 30 capsule 1   Polyethyl Glycol-Propyl Glycol  (SYSTANE) 0.4-0.3 % SOLN Place 1 drop into both eyes daily.     saccharomyces boulardii (FLORASTOR) 250 MG capsule Take 250 mg by mouth daily. gummy     tiZANidine (ZANAFLEX) 4 MG tablet Take 4 mg by mouth daily as needed for muscle spasms.     topiramate (TOPAMAX) 50 MG tablet TAKE 2 AND 1/2 TABLETS (125 MG TOTAL) BY MOUTH AT BEDTIME. 225 tablet 0   Vitamin D, Ergocalciferol, (DRISDOL) 1.25 MG (50000 UNIT) CAPS capsule TAKE 1 CAPSULE BY MOUTH EVERY 7 DAYS **NOT COVERED** 12 capsule 0   [  DISCONTINUED] metFORMIN (GLUCOPHAGE-XR) 500 MG 24 hr tablet Take 2 tablets (1,000 mg total) by mouth 2 (two) times daily. (Patient not taking: Reported on 06/05/2019) 30 tablet 0   [DISCONTINUED] Phentermine-Topiramate 11.25-69 MG CP24 Take 1 tablet by mouth daily. 30 capsule 1   No current facility-administered medications on file prior to visit.    ALLERGIES: No Known Allergies  FAMILY HISTORY: Family History  Problem Relation Age of Onset   Diabetes Mother    Hypertension Mother    Diabetes Father    Hypertension Father    Kidney failure Father    Hypertension Brother    Cancer Maternal Grandmother    Diabetes Maternal Grandmother    Pancreatic cancer Maternal Grandmother    Diabetes Paternal Grandmother    Breast cancer Paternal Grandmother    Stroke Paternal Grandfather    Healthy Son    Healthy Son    Cancer Maternal Aunt        breast   Breast cancer Maternal Aunt       Objective:  Blood pressure 127/81, pulse 80, height 5' 6"  (1.676 m), weight 208 lb 3.2 oz (94.4 kg), SpO2 98 %. General: No acute distress.  Patient appears well-groomed.   Head:  Normocephalic/atraumatic Eyes:  Fundi examined but not visualized Neck: supple, no paraspinal tenderness, full range of motion Heart:  Regular rate and rhythm Neurological Exam: alert and oriented to person, place, and time.  Speech fluent and not dysarthric, language intact.  CN II-XII intact. Bulk and tone normal, muscle strength 5/5  throughout.  Sensation to light touch intact.  Deep tendon reflexes 2+ throughout, toes downgoing.  Finger to nose testing intact.  Gait normal, Romberg negative.   Metta Clines, DO  CC: [name], FNP

## 2022-05-12 ENCOUNTER — Ambulatory Visit (INDEPENDENT_AMBULATORY_CARE_PROVIDER_SITE_OTHER): Payer: No Typology Code available for payment source | Admitting: Neurology

## 2022-05-12 ENCOUNTER — Encounter: Payer: Self-pay | Admitting: Neurology

## 2022-05-12 VITALS — BP 127/81 | HR 80 | Ht 66.0 in | Wt 208.2 lb

## 2022-05-12 DIAGNOSIS — G932 Benign intracranial hypertension: Secondary | ICD-10-CM

## 2022-05-12 DIAGNOSIS — Q282 Arteriovenous malformation of cerebral vessels: Secondary | ICD-10-CM

## 2022-05-12 DIAGNOSIS — G43009 Migraine without aura, not intractable, without status migrainosus: Secondary | ICD-10-CM

## 2022-05-12 DIAGNOSIS — G253 Myoclonus: Secondary | ICD-10-CM | POA: Diagnosis not present

## 2022-05-12 NOTE — Progress Notes (Signed)
Medication Samples have been provided to the patient.  Drug name: Nurtec       Strength: 75 mg        Qty: 3  LOT: [id number]  Exp.Date: 10/2023  Dosing instructions: as needed  The patient has been instructed regarding the correct time, dose, and frequency of taking this medication, including desired effects and most common side effects.   Venetia Night 2:30 PM 05/12/2022

## 2022-05-12 NOTE — Patient Instructions (Signed)
Take nurtec 1 tablet at earliest onset of migraine (1 in 24 hours).  Let me know if it works Continue Aimovig '140mg'$ , topiramate '125mg'$  at bedtime and acetazolamide '250mg'$  twice daily Follow up in 6 months.

## 2022-05-14 ENCOUNTER — Inpatient Hospital Stay
Admission: RE | Admit: 2022-05-14 | Discharge: 2022-05-14 | Disposition: A | Discharge status: Home / Self Care | Payer: No Typology Code available for payment source | Source: Ambulatory Visit | Attending: Nurse Practitioner | Admitting: Nurse Practitioner

## 2022-05-19 DIAGNOSIS — Z029 Encounter for administrative examinations, unspecified: Secondary | ICD-10-CM

## 2022-05-26 ENCOUNTER — Encounter: Payer: Self-pay | Admitting: Nurse Practitioner

## 2022-05-28 ENCOUNTER — Other Ambulatory Visit: Payer: Self-pay | Admitting: Neurology

## 2022-06-01 ENCOUNTER — Other Ambulatory Visit: Payer: Self-pay | Admitting: Neurology

## 2022-06-01 ENCOUNTER — Other Ambulatory Visit: Payer: Self-pay | Admitting: Nurse Practitioner

## 2022-06-01 DIAGNOSIS — F419 Anxiety disorder, unspecified: Secondary | ICD-10-CM

## 2022-06-01 DIAGNOSIS — E6609 Other obesity due to excess calories: Secondary | ICD-10-CM

## 2022-06-01 DIAGNOSIS — F32A Depression, unspecified: Secondary | ICD-10-CM

## 2022-06-01 DIAGNOSIS — G4709 Other insomnia: Secondary | ICD-10-CM

## 2022-06-19 ENCOUNTER — Other Ambulatory Visit: Payer: Self-pay | Admitting: Nurse Practitioner

## 2022-06-22 ENCOUNTER — Other Ambulatory Visit: Payer: Self-pay | Admitting: Nurse Practitioner

## 2022-06-22 DIAGNOSIS — G8929 Other chronic pain: Secondary | ICD-10-CM

## 2022-06-22 DIAGNOSIS — E6609 Other obesity due to excess calories: Secondary | ICD-10-CM

## 2022-06-22 DIAGNOSIS — F419 Anxiety disorder, unspecified: Secondary | ICD-10-CM

## 2022-06-22 MED ORDER — CLONAZEPAM 0.5 MG PO TABS: ORAL_TABLET | ORAL | 1 refills | Status: DC

## 2022-06-22 MED ORDER — PHENTERMINE HCL 30 MG PO CAPS: 30.0000 mg | ORAL_CAPSULE | ORAL | 1 refills | Status: DC

## 2022-06-29 ENCOUNTER — Ambulatory Visit: Payer: Self-pay

## 2022-06-29 ENCOUNTER — Encounter: Payer: Self-pay | Admitting: Orthopedic Surgery

## 2022-06-29 ENCOUNTER — Ambulatory Visit (INDEPENDENT_AMBULATORY_CARE_PROVIDER_SITE_OTHER): Payer: No Typology Code available for payment source | Admitting: Orthopedic Surgery

## 2022-06-29 ENCOUNTER — Ambulatory Visit (INDEPENDENT_AMBULATORY_CARE_PROVIDER_SITE_OTHER): Payer: No Typology Code available for payment source

## 2022-06-29 ENCOUNTER — Other Ambulatory Visit: Payer: Self-pay | Admitting: Nurse Practitioner

## 2022-06-29 VITALS — BP 121/79 | HR 97 | Ht 66.0 in | Wt 208.2 lb

## 2022-06-29 DIAGNOSIS — M542 Cervicalgia: Secondary | ICD-10-CM

## 2022-06-29 DIAGNOSIS — M545 Low back pain, unspecified: Secondary | ICD-10-CM

## 2022-06-29 DIAGNOSIS — M4712 Other spondylosis with myelopathy, cervical region: Secondary | ICD-10-CM

## 2022-06-29 MED ORDER — PHENTERMINE HCL 37.5 MG PO TABS: 37.5000 mg | ORAL_TABLET | Freq: Every day | ORAL | 1 refills | Status: DC

## 2022-06-29 NOTE — Progress Notes (Signed)
Orthopedic Spine Surgery Office Note  Assessment: Patient is a 46 y.o. female with neck pain that radiates into her left shoulder.  She had a positive Spurling.  She also complained of imbalance and hand dexterity issues.  She had a positive Romberg and instability with tandem gait.  Her M JOA score was 13 so this could be a myeloradiculopathy   Plan: -Patient has imbalance and dexterity issues, so recommended MRI of the cervical spine to evaluate for myelopathy -Referred her to physical therapy for her neck pain -Patient has tried activity modification, Tylenol -Recommended she continue with the Tylenol.  Said she could take '1000mg'$  with each meal -Patient should return to office in 4 weeks, repeat x-rays of cervical spine at next visit: None   Patient expressed understanding of the plan and all questions were answered to the patient's satisfaction.   ___________________________________________________________________________   History:  Patient is a 46 y.o. female who presents today for cervical spine.  Patient reports a 1 year history of neck pain that radiates into her left shoulder.  Does not go past the deltoid.  There is no trauma or injury that brought on the pain.  She feels this pain is gotten progressively worse with time.  She notes that rest makes it better.  She also reported feeling off balance and having unsteadiness with gait.  She states that she is unable to go up the stairs without using a handrail due to her unsteadiness.  She also has clumsiness in her hands.  States that she drops objects involuntarily at times.  Reports decreased sensation in her bilateral hands that is mild.   Weakness: Yes, reports feeling weaker in her hands Difficulty with fine motor skills (e.g., buttoning shirts, handwriting): Yes, has trouble buttoning shirts and will at times drop objects Symptoms of imbalance: Yes Paresthesias and numbness: Yes reports decreased sensation in her bilateral  hands and feet.  Does report a history of neuropathy Bowel or bladder incontinence: Denies Saddle anesthesia: Denies  Modified JOA score of 13 -4 points from upper extremity motor -4 points for lower extremity motor -2 points for upper extremity sensory -3 points for sphincter  Treatments tried: Activity modification, Tylenol  Review of systems: Denies fevers and chills, night sweats, unexplained weight loss, history of cancer, pain that wakes them at night  Past medical history: Fibromyalgia Migraines Anxiety Depression GERD Osteoporosis Ankylosing spondylitis Diabetes  Neuropathy Chronic pain Raynaud's disease AVM Increased intracranial pressure  Allergies: NKDA  Past surgical history:  Hysterectomy Cholecystectomy Esophageal dilation Ovarian cyst removal  Social history: Denies use of nicotine product (smoking, vaping, patches, smokeless) Alcohol use: Denies Denies recreational drug use  Physical Exam:  General: no acute distress, appears stated age Neurologic: alert, answering questions appropriately, following commands Respiratory: unlabored breathing on room air, symmetric chest rise Psychiatric: appropriate affect, normal cadence to speech   MSK (spine):  -Strength exam      Left  Right Grip strength                5/5  5/5 Interosseus   4/5   4/5 Wrist extension  5/5  5/5 Wrist flexion   5/5  5/5 Elbow flexion   5/5  5/5 Deltoid    5/5  5/5  EHL    5/5  5/5 TA    5/5  5/5 GSC    5/5  5/5 Knee extension  5/5  5/5 Hip flexion   5/5  5/5  -Sensory exam    Sensation  intact to light touch in L3-S1 nerve distributions of bilateral lower extremities  Sensation intact to light touch in C5-T1 nerve distributions of bilateral upper extremities  -Brachioradialis DTR: 2/4 on the left, 2/4 on the right -Biceps DTR: 2/4 on the left, 2/4 on the right -Achilles DTR: 2/4 on the left, 2/4 on the right -Patellar tendon DTR: 1/4 on the left, 1/4 on the  right  -Spurling: positive on the left, negative on the right -Hoffman sign: negative bilaterally -Clonus: No beats bilaterally -Interosseous wasting: None seen -Grip and release test: Positive -Romberg: Positive -Gait: Slow and wide based -Imbalance with tandem gait: Yes  Left shoulder exam: No pain through range of motion, negative Jobe, negative drop arm sign, no weakness with external rotation with arm at side, negative belly press, negative liftoff Right shoulder exam: No pain through range of motion, negative Jobe, negative drop arm sign, no weakness with external rotation with arm at side, negative belly press, negative liftoff  Tinel's at wrist: Negative bilaterally Phalen's at wrist: Negative bilaterally  Tinel's at elbow: Negative bilaterally Phalen's at elbow: Negative bilaterally  Imaging: XR of the cervical spine from 06/29/2022 was independently reviewed and interpreted, showing anterior osteophyte formation at C6/7. No evidence of instability on flexion/extension. No fracture or dislocation.    Patient name: Virginia Douglas Patient MRN: [id number] Date of visit: 06/29/22

## 2022-07-07 ENCOUNTER — Encounter: Payer: Self-pay | Admitting: Physical Therapy

## 2022-07-07 ENCOUNTER — Ambulatory Visit (INDEPENDENT_AMBULATORY_CARE_PROVIDER_SITE_OTHER): Payer: No Typology Code available for payment source | Admitting: Physical Therapy

## 2022-07-07 DIAGNOSIS — R2681 Unsteadiness on feet: Secondary | ICD-10-CM

## 2022-07-07 DIAGNOSIS — R293 Abnormal posture: Secondary | ICD-10-CM | POA: Diagnosis not present

## 2022-07-07 DIAGNOSIS — M542 Cervicalgia: Secondary | ICD-10-CM | POA: Diagnosis not present

## 2022-07-07 DIAGNOSIS — R29898 Other symptoms and signs involving the musculoskeletal system: Secondary | ICD-10-CM | POA: Diagnosis not present

## 2022-07-07 DIAGNOSIS — M6281 Muscle weakness (generalized): Secondary | ICD-10-CM | POA: Diagnosis not present

## 2022-07-07 NOTE — Therapy (Unsigned)
OUTPATIENT PHYSICAL THERAPY TREATMENT NOTE   Patient Name: Virginia Douglas MRN: [id number] DOB:1976/03/08, 46 y.o., female Today's Date: 07/10/2022  END OF SESSION:   PT End of Session - 07/10/22 0947     Visit Number 2    Number of Visits 17    Date for PT Re-Evaluation 09/01/22    Authorization Type [employer]    Authorization - Number of Visits 120    PT Start Time 0845    PT Stop Time 0930    PT Time Calculation (min) 45 min    Activity Tolerance Patient tolerated treatment well;Patient limited by pain    Behavior During Therapy WFL for tasks assessed/performed             Past Medical History:  Diagnosis Date   ADHD (attention deficit hyperactivity disorder)    Anemia    iron deficiency   as a teenager   Anxiety    Arthritis    Diabetes ([place])    Edema    GERD (gastroesophageal reflux disease)    Hypertension    patient denies- patient stated that the PCP said it was for heart prevention.   Migraine headache    Past Surgical History:  Procedure Laterality Date   ABDOMINAL HYSTERECTOMY  2012   CHOLECYSTECTOMY     COLONOSCOPY  12/28/2020   polyps   ESOPHAGEAL DILATION     gamma knife     04/2022   IR ANGIO INTRA EXTRACRAN SEL COM CAROTID INNOMINATE BILAT MOD SED  05/31/2021   IR ANGIO INTRA EXTRACRAN SEL COM CAROTID INNOMINATE BILAT MOD SED  06/27/2021   IR ANGIO INTRA EXTRACRAN SEL COM CAROTID INNOMINATE UNI R MOD SED  11/21/2021   IR ANGIO VERTEBRAL SEL VERTEBRAL UNI R MOD SED  05/31/2021   IR CT HEAD LTD  06/27/2021   IR CT HEAD LTD  11/21/2021   IR INTRA CRAN STENT  06/27/2021   IR INTRA CRAN STENT  11/21/2021   IR RADIOLOGIST EVAL & MGMT  06/03/2021   IR RADIOLOGIST EVAL & MGMT  07/13/2021   IR RADIOLOGIST EVAL & MGMT  08/27/2021   IR RADIOLOGIST EVAL & MGMT  10/30/2021   IR RADIOLOGIST EVAL & MGMT  12/06/2021   IR US GUIDE VASC ACCESS RIGHT  05/31/2021   IR US GUIDE VASC ACCESS RIGHT  06/27/2021   IR US GUIDE VASC ACCESS RIGHT  11/21/2021    OVARIAN CYST REMOVAL     RADIOLOGY WITH ANESTHESIA N/A 06/27/2021   Procedure: STENTING;  Surgeon: Luanne Bras, MD;  Location: [facility];  Service: Radiology;  Laterality: N/A;   RADIOLOGY WITH ANESTHESIA N/A 11/21/2021   Procedure: IR WITH ANESTHESIA STENTING;  Surgeon: Luanne Bras, MD;  Location: [facility];  Service: Radiology;  Laterality: N/A;   Patient Active Problem List   Diagnosis Date Noted   Cerebral venous thrombosis of sigmoid sinus 11/21/2021   Pulsatile tinnitus of right ear 11/21/2021   Pulsatile tinnitus, bilateral 06/27/2021   AVM (arteriovenous malformation) brain 05/31/2021   DDD (degenerative disc disease), cervical 12/06/2020   DDD (degenerative disc disease), lumbar 12/06/2020   Gastroesophageal reflux disease with esophagitis without hemorrhage 12/06/2020   Increased intracranial pressure 12/06/2020   PTSD (post-traumatic stress disorder) 12/06/2020   Attention deficit hyperactivity disorder (ADHD), predominantly inattentive type 12/06/2020   Vitamin D deficiency 12/06/2020   Obesity (BMI 30-39.9) 03/15/2020   Sciatic nerve pain 12/10/2019   Acute pain of left shoulder 06/05/2019   Grief 06/05/2019   Migraine 06/05/2019  Type 2 diabetes mellitus without complication, without long-term current use of insulin ([place]) 04/07/2019   Depression 04/07/2019   Obesity due to excess calories without serious comorbidity 04/07/2019   Left leg pain 04/07/2019   Anxiety 04/07/2019   Headache 07/06/2018   Diabetes ([place]) 12/08/2015   Essential hypertension 12/08/2015   Smoker 12/08/2015   LLQ pain 12/08/2015   Paresthesia 09/16/2015   Urinary urgency 09/16/2015     THERAPY DIAG:  Cervicalgia  Muscle weakness (generalized)  Abnormal posture  Unsteadiness on feet  Other symptoms and signs involving the musculoskeletal system  Rationale for Evaluation and Treatment: Rehabilitation   ONSET DATE: 06/29/2022    SUBJECTIVE:                                                                                                                                                                                                           SUBJECTIVE STATEMENT: Pt states that she continues to have the same symptoms. She reports getting her MRI scheduled for the 12th of November.    I have a 81-70 month old grandbaby, from my front door to the pool is about as long as this clinic, carrying the baby that distance I could barely stand up. I feel like my head is too big for my body, I work at a computer and sometimes I have a pain that goes from the base of my neck down my left arm and I get really stiff. It hurts so bad I bought a cervical collar to hold my neck up, doing my job I have to lay my head on my desk because my head is just so heavy. I have n/t on B sides of my neck, its in my hands and feet due to neuropathy. Sometimes I have issues with holding things and it will just fall out of my hand, I don't have a lot of strength to push and pull. This has been going on for about a year.    PERTINENT HISTORY:  Assessment: Patient is a 46 y.o. female with neck pain that radiates into her left shoulder.  She had a positive Spurling.  She also complained of imbalance and hand dexterity issues.  She had a positive Romberg and instability with tandem gait.  Her M JOA score was 13 so this could be a myeloradiculopathy     Plan: -Patient has imbalance and dexterity issues, so recommended MRI of the cervical spine to evaluate for myelopathy -Referred her to physical therapy for her neck pain -Patient has tried activity modification, Tylenol -Recommended  she continue with the Tylenol.  Said she could take '1000mg'$  with each meal -Patient should return to office in 4 weeks, repeat x-rays of cervical spine at next visit: None     Patient expressed understanding of the plan and all questions were answered to the patient's satisfaction.    PAIN:  Are you having pain? Yes: NPRS scale:  3/10; can get to 9/10 at worst  Pain location: back of neck L side going into shoulder  Pain description: sharp pain, feelings of weakness, stabbing, stiff  Aggravating factors: sitting or standing for a long time, walking makes it worse  Relieving factors: massage, heat, tylenol    PRECAUTIONS: Fall   WEIGHT BEARING RESTRICTIONS: No   FALLS:  Has patient fallen in last 6 months? Yes. Number of falls 2- "I'm clumsy, legs get weak" one fall legs gave way, another fall I missed a step; no FOF   LIVING ENVIRONMENT: Lives with: lives alone Lives in: House/apartment Stairs: 14 steps, B rails cannot reach them at the same time Has following equipment at home: None   OCCUPATION: computer work    PLOF: Independent, Independent with basic ADLs, Independent with gait, and Independent with transfers   PATIENT GOALS: no more pain, be able to move neck like a normal person    NEXT MD VISIT:    OBJECTIVE:    DIAGNOSTIC FINDINGS:      PATIENT SURVEYS:  FOTO 32   COGNITION: Overall cognitive status: Within functional limits for tasks assessed   SENSATION: Hx of neuropathy in B feet and hands, DNT today (time limits of session)   POSTURE: rounded shoulders, forward head, decreased lumbar lordosis, increased thoracic kyphosis, and flexed trunk    PALPATION: Multiple trigger points in B thoracic and cervical paraspinals, upper traps, suboccipitals; very TTP        CERVICAL ROM:    Active ROM A/PROM (deg) eval  Flexion 25   Extension 10 before she compensates with thoracic extension   Right lateral flexion 15  Left lateral flexion 14  Right rotation Severe limitation   Left rotation Extreme limitation, unable to perform without compensation   (Blank rows = not tested)   Thoracic ROM: flexion severe limitation, extension extreme limitation, lateral flexion severe limitation B, rotation moderate limitation B   UPPER EXTREMITY MMT:  Cervical strength: flexion 3+/5, lateral  flexion R 3/5 L 3/5 pain limited, extension 2/5 multiple compensations, rotation R 3+/5 L 3/5 sitting    CERVICAL SPECIAL TESTS:      FUNCTIONAL TESTS:      TODAY'S TREATMENT:   Seated Cervical Sidebending AROM   Oh flexion with 1lb bar 2x10  Chest press with 1lb bar 2x10  Supine Cervical Rotation AROM   Seated SNAG's 10 x to L only.  Seated Cervical Extension AROM 2x10   Seated Thoracic Lumbar Extension   UBE 3 min forward/ 3 min backward  Self Care:  Further discussion about symptoms. Requested pt follow up with MD for a full blood panel.  DATE:    Eval   Eval/education as appropriate   UBE 3 min forward/3 backward L1     PATIENT EDUCATION:  Education details: exam findings, POC, HEP  Person educated: Patient Education method: Consulting civil engineer, Demonstration, and Handouts Education comprehension: verbalized understanding, returned demonstration, and needs further education   HOME EXERCISE PROGRAM:   [id number]   ASSESSMENT:   CLINICAL IMPRESSION: Pt arrives to first follow up appt with continued reported pain stiffness and weakness in cervical spine. Continued discussion per eval notes. Discussed following up with MD for a full blood panel to rule out any underlying issues. Pt denies any MOI and states that the symptoms came on suddenly, but gradually. She has a hx of chronic migraines and recently had a AVM surgery. Session with review of HEP. Pt required frequent cues and redirection for forum and to minimize pain with exercises. She was able to tolerate all prescribed exercises with slow controlled movements. She requires intermittent rest breaks. She departs session with no increase in symptoms. Pt will continue to benefit from skilled PT to address continued deficits.    OBJECTIVE IMPAIRMENTS: decreased balance, decreased coordination, decreased ROM,  decreased strength, hypomobility, increased fascial restrictions, increased muscle spasms, impaired flexibility, impaired sensation, impaired UE functional use, improper body mechanics, postural dysfunction, and pain.    ACTIVITY LIMITATIONS: carrying, lifting, bed mobility, bathing, dressing, self feeding, reach over head, and hygiene/grooming   PARTICIPATION LIMITATIONS: meal prep, cleaning, laundry, driving, shopping, community activity, and occupation   PERSONAL FACTORS: Age, Behavior pattern, Fitness, Past/current experiences, Social background, and Time since onset of injury/illness/exacerbation are also affecting patient's functional outcome.    REHAB POTENTIAL: Good   CLINICAL DECISION MAKING: Evolving/moderate complexity   EVALUATION COMPLEXITY: Moderate     GOALS: Goals reviewed with patient? Yes   SHORT TERM GOALS: Target date: 08/04/2022    Will be compliant with appropriate progressive HEP  Baseline:  Goal status: INITIAL   2.  Cervical ROM to improve by at least 25 degrees all planes of motion  Baseline:  Goal status: INITIAL   3.  Thoracic ROM to be no more than moderately limited all planes of motion  Baseline:  Goal status: INITIAL   4.  Pain to be no more than 6/10 at worst  Baseline:  Goal status: INITIAL   5.  Will have good understanding of biomechanics/ergonomics for work Baseline:  Goal status: INITIAL       LONG TERM GOALS: Target date: 09/01/2022   MMT all weak groups to have improved by at least 1 grade  Baseline:  Goal status: INITIAL   2.  Will report 50% reduction in frequency of dropping items/will demonstrate at least a 15# improvement in B grip strength Baseline:  Goal status: INITIAL   3.  Pain to be no more than 4/10 at worst  Baseline:  Goal status: INITIAL   4.  Will score at least 22/24 on DGI to show reduced fall risk  Baseline:  Goal status: INITIAL   5.  Will be able to walk at least 530f carrying grandchild without  feelings of falling or dropping the child  Baseline:  Goal status: INITIAL     PLAN:   PT FREQUENCY: 2x/week   PT DURATION: 8 weeks   PLANNED INTERVENTIONS: Therapeutic exercises, Therapeutic activity, Neuromuscular re-education, Balance training, Gait training, Patient/Family education, Self Care, Joint mobilization, Aquatic Therapy, Dry Needling, Electrical stimulation, Spinal mobilization, Cryotherapy, Moist heat, Taping, Traction, Ultrasound, Ionotophoresis '4mg'$ /ml Dexamethasone, Manual therapy, and Re-evaluation  PLAN FOR NEXT SESSION: need to do UE MMT, balance, consider cervical special tests (sorry pt late at eval so not able to get thru everything); gentle progressive motion and strength as tolerated, consider TENS      Lynden Ang, PT 07/10/2022, 9:47 AM

## 2022-07-07 NOTE — Therapy (Signed)
OUTPATIENT PHYSICAL THERAPY CERVICAL EVALUATION   Patient Name: Virginia Douglas MRN: [id number] DOB:17-Jan-1976, 46 y.o., female Today's Date: 07/07/2022   PT End of Session - 07/07/22 0848     Visit Number 1    Number of Visits 17    Date for PT Re-Evaluation 09/01/22    Authorization Type [employer]    Authorization - Number of Visits 120    PT Start Time 0809   pt late   PT Stop Time 0845    PT Time Calculation (min) 36 min    Activity Tolerance Patient tolerated treatment well    Behavior During Therapy WFL for tasks assessed/performed             Past Medical History:  Diagnosis Date   ADHD (attention deficit hyperactivity disorder)    Anemia    iron deficiency   as a teenager   Anxiety    Arthritis    Diabetes ([place])    Edema    GERD (gastroesophageal reflux disease)    Hypertension    patient denies- patient stated that the PCP said it was for heart prevention.   Migraine headache    Past Surgical History:  Procedure Laterality Date   ABDOMINAL HYSTERECTOMY  2012   CHOLECYSTECTOMY     COLONOSCOPY  12/28/2020   polyps   ESOPHAGEAL DILATION     gamma knife     04/2022   IR ANGIO INTRA EXTRACRAN SEL COM CAROTID INNOMINATE BILAT MOD SED  05/31/2021   IR ANGIO INTRA EXTRACRAN SEL COM CAROTID INNOMINATE BILAT MOD SED  06/27/2021   IR ANGIO INTRA EXTRACRAN SEL COM CAROTID INNOMINATE UNI R MOD SED  11/21/2021   IR ANGIO VERTEBRAL SEL VERTEBRAL UNI R MOD SED  05/31/2021   IR CT HEAD LTD  06/27/2021   IR CT HEAD LTD  11/21/2021   IR INTRA CRAN STENT  06/27/2021   IR INTRA CRAN STENT  11/21/2021   IR RADIOLOGIST EVAL & MGMT  06/03/2021   IR RADIOLOGIST EVAL & MGMT  07/13/2021   IR RADIOLOGIST EVAL & MGMT  08/27/2021   IR RADIOLOGIST EVAL & MGMT  10/30/2021   IR RADIOLOGIST EVAL & MGMT  12/06/2021   IR US GUIDE VASC ACCESS RIGHT  05/31/2021   IR US GUIDE VASC ACCESS RIGHT  06/27/2021   IR US GUIDE VASC ACCESS RIGHT  11/21/2021   OVARIAN CYST REMOVAL      RADIOLOGY WITH ANESTHESIA N/A 06/27/2021   Procedure: STENTING;  Surgeon: Luanne Bras, MD;  Location: [place];  Service: Radiology;  Laterality: N/A;   RADIOLOGY WITH ANESTHESIA N/A 11/21/2021   Procedure: IR WITH ANESTHESIA STENTING;  Surgeon: Luanne Bras, MD;  Location: [facility];  Service: Radiology;  Laterality: N/A;   Patient Active Problem List   Diagnosis Date Noted   Cerebral venous thrombosis of sigmoid sinus 11/21/2021   Pulsatile tinnitus of right ear 11/21/2021   Pulsatile tinnitus, bilateral 06/27/2021   AVM (arteriovenous malformation) brain 05/31/2021   DDD (degenerative disc disease), cervical 12/06/2020   DDD (degenerative disc disease), lumbar 12/06/2020   Gastroesophageal reflux disease with esophagitis without hemorrhage 12/06/2020   Increased intracranial pressure 12/06/2020   PTSD (post-traumatic stress disorder) 12/06/2020   Attention deficit hyperactivity disorder (ADHD), predominantly inattentive type 12/06/2020   Vitamin D deficiency 12/06/2020   Obesity (BMI 30-39.9) 03/15/2020   Sciatic nerve pain 12/10/2019   Acute pain of left shoulder 06/05/2019   Grief 06/05/2019   Migraine 06/05/2019   Type 2  diabetes mellitus without complication, without long-term current use of insulin ([place]) 04/07/2019   Depression 04/07/2019   Obesity due to excess calories without serious comorbidity 04/07/2019   Left leg pain 04/07/2019   Anxiety 04/07/2019   Headache 07/06/2018   Diabetes ([place]) 12/08/2015   Essential hypertension 12/08/2015   Smoker 12/08/2015   LLQ pain 12/08/2015   Paresthesia 09/16/2015   Urinary urgency 09/16/2015    PCP: [name]   REFERRING PROVIDER: Callie Fielding, MD   REFERRING DIAG: [phone] (ICD-10-CM) - Other spondylosis with myelopathy, cervical region   THERAPY DIAG:  Cervicalgia  Abnormal posture  Muscle weakness (generalized)  Other symptoms and signs involving the musculoskeletal system  Unsteadiness on  feet  Rationale for Evaluation and Treatment: Rehabilitation  ONSET DATE: 06/29/2022   SUBJECTIVE:                                                                                                                                                                                                         SUBJECTIVE STATEMENT: I have a 25-42 month old grandbaby, from my front door to the pool is about as long as this clinic, carrying the baby that distance I could barely stand up. I feel like my head is too big for my body, I work at a computer and sometimes I have a pain that goes from the base of my neck down my left arm and I get really stiff. It hurts so bad I bought a cervical collar to hold my neck up, doing my job I have to lay my head on my desk because my head is just so heavy. I have n/t on B sides of my neck, its in my hands and feet due to neuropathy. Sometimes I have issues with holding things and it will just fall out of my hand, I don't have a lot of strength to push and pull. This has been going on for about a year.   PERTINENT HISTORY:  Assessment: Patient is a 46 y.o. female with neck pain that radiates into her left shoulder.  She had a positive Spurling.  She also complained of imbalance and hand dexterity issues.  She had a positive Romberg and instability with tandem gait.  Her M JOA score was 13 so this could be a myeloradiculopathy     Plan: -Patient has imbalance and dexterity issues, so recommended MRI of the cervical spine to evaluate for myelopathy -Referred her to physical therapy for her neck pain -Patient has tried activity modification, Tylenol -Recommended she continue with the Tylenol.  Said she could take '1000mg'$  with each meal -Patient should return to office in 4 weeks, repeat x-rays of cervical spine at next visit: None     Patient expressed understanding of the plan and all questions were answered to the patient's satisfaction.   PAIN:  Are you having pain? Yes:  NPRS scale: 3/10; can get to 9/10 at worst  Pain location: back of neck L side going into shoulder  Pain description: sharp pain, feelings of weakness, stabbing, stiff  Aggravating factors: sitting or standing for a long time, walking makes it worse  Relieving factors: massage, heat, tylenol   PRECAUTIONS: Fall  WEIGHT BEARING RESTRICTIONS: No  FALLS:  Has patient fallen in last 6 months? Yes. Number of falls 2- "I'm clumsy, legs get weak" one fall legs gave way, another fall I missed a step; no FOF  LIVING ENVIRONMENT: Lives with: lives alone Lives in: House/apartment Stairs: 14 steps, B rails cannot reach them at the same time Has following equipment at home: None  OCCUPATION: computer work   PLOF: Independent, Independent with basic ADLs, Independent with gait, and Independent with transfers  PATIENT GOALS: no more pain, be able to move neck like a normal person   NEXT MD VISIT:   OBJECTIVE:   DIAGNOSTIC FINDINGS:    PATIENT SURVEYS:  FOTO 32  COGNITION: Overall cognitive status: Within functional limits for tasks assessed  SENSATION: Hx of neuropathy in B feet and hands, DNT today (time limits of session)  POSTURE: rounded shoulders, forward head, decreased lumbar lordosis, increased thoracic kyphosis, and flexed trunk   PALPATION: Multiple trigger points in B thoracic and cervical paraspinals, upper traps, suboccipitals; very TTP    CERVICAL ROM:   Active ROM A/PROM (deg) eval  Flexion 25   Extension 10 before she compensates with thoracic extension   Right lateral flexion 15  Left lateral flexion 14  Right rotation Severe limitation   Left rotation Extreme limitation, unable to perform without compensation   (Blank rows = not tested)  Thoracic ROM: flexion severe limitation, extension extreme limitation, lateral flexion severe limitation B, rotation moderate limitation B  UPPER EXTREMITY ROM:  Active ROM Right eval Left eval  Shoulder flexion     Shoulder extension    Shoulder abduction    Shoulder adduction    Shoulder extension    Shoulder internal rotation    Shoulder external rotation    Elbow flexion    Elbow extension    Wrist flexion    Wrist extension    Wrist ulnar deviation    Wrist radial deviation    Wrist pronation    Wrist supination     (Blank rows = not tested)  UPPER EXTREMITY MMT:  MMT Right eval Left eval  Shoulder flexion    Shoulder extension    Shoulder abduction    Shoulder adduction    Shoulder extension    Shoulder internal rotation    Shoulder external rotation    Middle trapezius    Lower trapezius    Elbow flexion    Elbow extension    Wrist flexion    Wrist extension    Wrist ulnar deviation    Wrist radial deviation    Wrist pronation    Wrist supination    Grip strength     (Blank rows = not tested)  Cervical strength: flexion 3+/5, lateral flexion R 3/5 L 3/5 pain limited, extension 2/5 multiple compensations, rotation R 3+/5 L 3/5 sitting   CERVICAL SPECIAL  TESTS:    FUNCTIONAL TESTS:    TODAY'S TREATMENT:                                                                                                                              DATE:   Eval  Eval/education as appropriate  UBE 3 min forward/3 backward L1    PATIENT EDUCATION:  Education details: exam findings, POC, HEP  Person educated: Patient Education method: Explanation, Demonstration, and Handouts Education comprehension: verbalized understanding, returned demonstration, and needs further education  HOME EXERCISE PROGRAM:  [id number]  ASSESSMENT:  CLINICAL IMPRESSION: Patient is a 46 y.o. F who was seen today for physical therapy evaluation and treatment for cervical spondylosis. Of note, getting MRI within next couple weeks as referring seems to suspect possible neuro involvement. Exam was a bit limited as patient arrived a bit late, primary findings today include severe limitations in cervical and  thoracic ROM, postural limitations, severe soft tissue spasms and trigger points, and poor postural and cervical strength. Will complete eval next session. May benefit from skilled PT services to address limitations, reduce pain and fall risk, and optimize overall level of function.   OBJECTIVE IMPAIRMENTS: decreased balance, decreased coordination, decreased ROM, decreased strength, hypomobility, increased fascial restrictions, increased muscle spasms, impaired flexibility, impaired sensation, impaired UE functional use, improper body mechanics, postural dysfunction, and pain.   ACTIVITY LIMITATIONS: carrying, lifting, bed mobility, bathing, dressing, self feeding, reach over head, and hygiene/grooming  PARTICIPATION LIMITATIONS: meal prep, cleaning, laundry, driving, shopping, community activity, and occupation  PERSONAL FACTORS: Age, Behavior pattern, Fitness, Past/current experiences, Social background, and Time since onset of injury/illness/exacerbation are also affecting patient's functional outcome.   REHAB POTENTIAL: Good  CLINICAL DECISION MAKING: Evolving/moderate complexity  EVALUATION COMPLEXITY: Moderate   GOALS: Goals reviewed with patient? Yes  SHORT TERM GOALS: Target date: 08/04/2022   Will be compliant with appropriate progressive HEP  Baseline:  Goal status: INITIAL  2.  Cervical ROM to improve by at least 25 degrees all planes of motion  Baseline:  Goal status: INITIAL  3.  Thoracic ROM to be no more than moderately limited all planes of motion  Baseline:  Goal status: INITIAL  4.  Pain to be no more than 6/10 at worst  Baseline:  Goal status: INITIAL  5.  Will have good understanding of biomechanics/ergonomics for work Baseline:  Goal status: INITIAL    LONG TERM GOALS: Target date: 09/01/2022  MMT all weak groups to have improved by at least 1 grade  Baseline:  Goal status: INITIAL  2.  Will report 50% reduction in frequency of dropping  items/will demonstrate at least a 15# improvement in B grip strength Baseline:  Goal status: INITIAL  3.  Pain to be no more than 4/10 at worst  Baseline:  Goal status: INITIAL  4.  Will score at least 22/24 on DGI to show reduced fall risk  Baseline:  Goal status: INITIAL  5.  Will be able to walk at least 561f carrying grandchild without feelings of falling or dropping the child  Baseline:  Goal status: INITIAL   PLAN:  PT FREQUENCY: 2x/week  PT DURATION: 8 weeks  PLANNED INTERVENTIONS: Therapeutic exercises, Therapeutic activity, Neuromuscular re-education, Balance training, Gait training, Patient/Family education, Self Care, Joint mobilization, Aquatic Therapy, Dry Needling, Electrical stimulation, Spinal mobilization, Cryotherapy, Moist heat, Taping, Traction, Ultrasound, Ionotophoresis '4mg'$ /ml Dexamethasone, Manual therapy, and Re-evaluation  PLAN FOR NEXT SESSION: need to do UE MMT, balance, consider cervical special tests (sorry pt late at eval so not able to get thru everything); gentle progressive motion and strength as tolerated, consider TENS    Shamarra Warda U PT DPT PN2  07/07/2022, 8:56 AM

## 2022-07-10 ENCOUNTER — Ambulatory Visit (INDEPENDENT_AMBULATORY_CARE_PROVIDER_SITE_OTHER): Payer: No Typology Code available for payment source | Admitting: Physical Therapy

## 2022-07-10 DIAGNOSIS — R293 Abnormal posture: Secondary | ICD-10-CM | POA: Diagnosis not present

## 2022-07-10 DIAGNOSIS — M542 Cervicalgia: Secondary | ICD-10-CM | POA: Diagnosis not present

## 2022-07-10 DIAGNOSIS — M7918 Myalgia, other site: Secondary | ICD-10-CM

## 2022-07-10 DIAGNOSIS — R2681 Unsteadiness on feet: Secondary | ICD-10-CM | POA: Diagnosis not present

## 2022-07-10 DIAGNOSIS — M6281 Muscle weakness (generalized): Secondary | ICD-10-CM

## 2022-07-10 DIAGNOSIS — I7300 Raynaud's syndrome without gangrene: Secondary | ICD-10-CM

## 2022-07-10 DIAGNOSIS — R29898 Other symptoms and signs involving the musculoskeletal system: Secondary | ICD-10-CM

## 2022-07-11 ENCOUNTER — Other Ambulatory Visit: Payer: Self-pay | Admitting: *Deleted

## 2022-07-11 DIAGNOSIS — I7300 Raynaud's syndrome without gangrene: Secondary | ICD-10-CM

## 2022-07-11 DIAGNOSIS — M7918 Myalgia, other site: Secondary | ICD-10-CM

## 2022-07-12 ENCOUNTER — Other Ambulatory Visit: Payer: Self-pay | Admitting: Nurse Practitioner

## 2022-07-12 DIAGNOSIS — E119 Type 2 diabetes mellitus without complications: Secondary | ICD-10-CM

## 2022-07-13 ENCOUNTER — Other Ambulatory Visit: Payer: Self-pay | Admitting: Nurse Practitioner

## 2022-07-13 DIAGNOSIS — I10 Essential (primary) hypertension: Secondary | ICD-10-CM

## 2022-07-13 NOTE — Therapy (Incomplete)
OUTPATIENT PHYSICAL THERAPY TREATMENT NOTE   Patient Name: Virginia Douglas MRN: [id number] DOB:Apr 18, 1976, 46 y.o., female Today's Date: 07/13/2022  END OF SESSION:     Past Medical History:  Diagnosis Date   ADHD (attention deficit hyperactivity disorder)    Anemia    iron deficiency   as a teenager   Anxiety    Arthritis    Diabetes ([place])    Edema    GERD (gastroesophageal reflux disease)    Hypertension    patient denies- patient stated that the PCP said it was for heart prevention.   Migraine headache    Past Surgical History:  Procedure Laterality Date   ABDOMINAL HYSTERECTOMY  2012   CHOLECYSTECTOMY     COLONOSCOPY  12/28/2020   polyps   ESOPHAGEAL DILATION     gamma knife     04/2022   IR ANGIO INTRA EXTRACRAN SEL COM CAROTID INNOMINATE BILAT MOD SED  05/31/2021   IR ANGIO INTRA EXTRACRAN SEL COM CAROTID INNOMINATE BILAT MOD SED  06/27/2021   IR ANGIO INTRA EXTRACRAN SEL COM CAROTID INNOMINATE UNI R MOD SED  11/21/2021   IR ANGIO VERTEBRAL SEL VERTEBRAL UNI R MOD SED  05/31/2021   IR CT HEAD LTD  06/27/2021   IR CT HEAD LTD  11/21/2021   IR INTRA CRAN STENT  06/27/2021   IR INTRA CRAN STENT  11/21/2021   IR RADIOLOGIST EVAL & MGMT  06/03/2021   IR RADIOLOGIST EVAL & MGMT  07/13/2021   IR RADIOLOGIST EVAL & MGMT  08/27/2021   IR RADIOLOGIST EVAL & MGMT  10/30/2021   IR RADIOLOGIST EVAL & MGMT  12/06/2021   IR US GUIDE VASC ACCESS RIGHT  05/31/2021   IR US GUIDE VASC ACCESS RIGHT  06/27/2021   IR US GUIDE VASC ACCESS RIGHT  11/21/2021   OVARIAN CYST REMOVAL     RADIOLOGY WITH ANESTHESIA N/A 06/27/2021   Procedure: STENTING;  Surgeon: Luanne Bras, MD;  Location: [facility];  Service: Radiology;  Laterality: N/A;   RADIOLOGY WITH ANESTHESIA N/A 11/21/2021   Procedure: IR WITH ANESTHESIA STENTING;  Surgeon: Luanne Bras, MD;  Location: [facility];  Service: Radiology;  Laterality: N/A;   Patient Active Problem List   Diagnosis Date Noted   Cerebral  venous thrombosis of sigmoid sinus 11/21/2021   Pulsatile tinnitus of right ear 11/21/2021   Pulsatile tinnitus, bilateral 06/27/2021   AVM (arteriovenous malformation) brain 05/31/2021   DDD (degenerative disc disease), cervical 12/06/2020   DDD (degenerative disc disease), lumbar 12/06/2020   Gastroesophageal reflux disease with esophagitis without hemorrhage 12/06/2020   Increased intracranial pressure 12/06/2020   PTSD (post-traumatic stress disorder) 12/06/2020   Attention deficit hyperactivity disorder (ADHD), predominantly inattentive type 12/06/2020   Vitamin D deficiency 12/06/2020   Obesity (BMI 30-39.9) 03/15/2020   Sciatic nerve pain 12/10/2019   Acute pain of left shoulder 06/05/2019   Grief 06/05/2019   Migraine 06/05/2019   Type 2 diabetes mellitus without complication, without long-term current use of insulin ([place]) 04/07/2019   Depression 04/07/2019   Obesity due to excess calories without serious comorbidity 04/07/2019   Left leg pain 04/07/2019   Anxiety 04/07/2019   Headache 07/06/2018   Diabetes ([place]) 12/08/2015   Essential hypertension 12/08/2015   Smoker 12/08/2015   LLQ pain 12/08/2015   Paresthesia 09/16/2015   Urinary urgency 09/16/2015     THERAPY DIAG:  No diagnosis found.  Rationale for Evaluation and Treatment: Rehabilitation   ONSET DATE: 06/29/2022    SUBJECTIVE:  SUBJECTIVE STATEMENT: ***  Pt states that she continues to have the same symptoms. She reports getting her MRI scheduled for the 12th of November.    I have a 32-62 month old grandbaby, from my front door to the pool is about as long as this clinic, carrying the baby that distance I could barely stand up. I feel like my head is too big for my body, I work at a computer and sometimes I  have a pain that goes from the base of my neck down my left arm and I get really stiff. It hurts so bad I bought a cervical collar to hold my neck up, doing my job I have to lay my head on my desk because my head is just so heavy. I have n/t on B sides of my neck, its in my hands and feet due to neuropathy. Sometimes I have issues with holding things and it will just fall out of my hand, I don't have a lot of strength to push and pull. This has been going on for about a year.    PERTINENT HISTORY:  Assessment: Patient is a 46 y.o. female with neck pain that radiates into her left shoulder.  She had a positive Spurling.  She also complained of imbalance and hand dexterity issues.  She had a positive Romberg and instability with tandem gait.  Her M JOA score was 13 so this could be a myeloradiculopathy     Plan: -Patient has imbalance and dexterity issues, so recommended MRI of the cervical spine to evaluate for myelopathy -Referred her to physical therapy for her neck pain -Patient has tried activity modification, Tylenol -Recommended she continue with the Tylenol.  Said she could take '1000mg'$  with each meal -Patient should return to office in 4 weeks, repeat x-rays of cervical spine at next visit: None     Patient expressed understanding of the plan and all questions were answered to the patient's satisfaction.    PAIN:  Are you having pain? Yes: NPRS scale: 3/10; can get to 9/10 at worst  Pain location: back of neck L side going into shoulder  Pain description: sharp pain, feelings of weakness, stabbing, stiff  Aggravating factors: sitting or standing for a long time, walking makes it worse  Relieving factors: massage, heat, tylenol    PRECAUTIONS: Fall   WEIGHT BEARING RESTRICTIONS: No   FALLS:  Has patient fallen in last 6 months? Yes. Number of falls 2- "I'm clumsy, legs get weak" one fall legs gave way, another fall I missed a step; no FOF   LIVING ENVIRONMENT: Lives with: lives  alone Lives in: House/apartment Stairs: 14 steps, B rails cannot reach them at the same time Has following equipment at home: None   OCCUPATION: computer work    PLOF: Independent, Independent with basic ADLs, Independent with gait, and Independent with transfers   PATIENT GOALS: no more pain, be able to move neck like a normal person    NEXT MD VISIT:    OBJECTIVE:    DIAGNOSTIC FINDINGS:      PATIENT SURVEYS:  FOTO 32   COGNITION: Overall cognitive status: Within functional limits for tasks assessed   SENSATION: Hx of neuropathy in B feet and hands, DNT today (time limits of session)   POSTURE: rounded shoulders, forward head, decreased lumbar lordosis, increased thoracic kyphosis, and flexed trunk    PALPATION: Multiple trigger points in B thoracic and cervical paraspinals, upper traps, suboccipitals; very TTP  CERVICAL ROM:    Active ROM A/PROM (deg) eval  Flexion 25   Extension 10 before she compensates with thoracic extension   Right lateral flexion 15  Left lateral flexion 14  Right rotation Severe limitation   Left rotation Extreme limitation, unable to perform without compensation   (Blank rows = not tested)   Thoracic ROM: flexion severe limitation, extension extreme limitation, lateral flexion severe limitation B, rotation moderate limitation B   UPPER EXTREMITY MMT:  Cervical strength: flexion 3+/5, lateral flexion R 3/5 L 3/5 pain limited, extension 2/5 multiple compensations, rotation R 3+/5 L 3/5 sitting    CERVICAL SPECIAL TESTS:      FUNCTIONAL TESTS:      TODAY'S TREATMENT:  DATE: 07/14/22 ***  DATE: 07/10/22 Seated Cervical Sidebending AROM   Oh flexion with 1lb bar 2x10  Chest press with 1lb bar 2x10  Supine Cervical Rotation AROM   Seated SNAG's 10 x to L only.  Seated Cervical Extension AROM 2x10   Seated Thoracic Lumbar Extension   UBE 3 min forward/ 3 min backward  Self Care:  Further discussion about symptoms.  Requested pt follow up with MD for a full blood panel.                                                                                                                          Eval   Eval/education as appropriate   UBE 3 min forward/3 backward L1     PATIENT EDUCATION:  Education details: exam findings, POC, HEP  Person educated: Patient Education method: Consulting civil engineer, Demonstration, and Handouts Education comprehension: verbalized understanding, returned demonstration, and needs further education   HOME EXERCISE PROGRAM:   [id number]   ASSESSMENT:   CLINICAL IMPRESSION: *** Pt arrives to first follow up appt with continued reported pain stiffness and weakness in cervical spine. Continued discussion per eval notes. Discussed following up with MD for a full blood panel to rule out any underlying issues. Pt denies any MOI and states that the symptoms came on suddenly, but gradually. She has a hx of chronic migraines and recently had a AVM surgery. Session with review of HEP. Pt required frequent cues and redirection for forum and to minimize pain with exercises. She was able to tolerate all prescribed exercises with slow controlled movements. She requires intermittent rest breaks. She departs session with no increase in symptoms. Pt will continue to benefit from skilled PT to address continued deficits.    OBJECTIVE IMPAIRMENTS: decreased balance, decreased coordination, decreased ROM, decreased strength, hypomobility, increased fascial restrictions, increased muscle spasms, impaired flexibility, impaired sensation, impaired UE functional use, improper body mechanics, postural dysfunction, and pain.    ACTIVITY LIMITATIONS: carrying, lifting, bed mobility, bathing, dressing, self feeding, reach over head, and hygiene/grooming   PARTICIPATION LIMITATIONS: meal prep, cleaning, laundry, driving, shopping, community activity, and occupation   PERSONAL FACTORS: Age, Behavior pattern, Fitness,  Past/current experiences, Social background, and Time since onset of injury/illness/exacerbation are also affecting patient's  functional outcome.    REHAB POTENTIAL: Good   CLINICAL DECISION MAKING: Evolving/moderate complexity   EVALUATION COMPLEXITY: Moderate     GOALS: Goals reviewed with patient? Yes   SHORT TERM GOALS: Target date: 08/04/2022    Will be compliant with appropriate progressive HEP  Baseline:  Goal status: INITIAL   2.  Cervical ROM to improve by at least 25 degrees all planes of motion  Baseline:  Goal status: INITIAL   3.  Thoracic ROM to be no more than moderately limited all planes of motion  Baseline:  Goal status: INITIAL   4.  Pain to be no more than 6/10 at worst  Baseline:  Goal status: INITIAL   5.  Will have good understanding of biomechanics/ergonomics for work Baseline:  Goal status: INITIAL       LONG TERM GOALS: Target date: 09/01/2022   MMT all weak groups to have improved by at least 1 grade  Baseline:  Goal status: INITIAL   2.  Will report 50% reduction in frequency of dropping items/will demonstrate at least a 15# improvement in B grip strength Baseline:  Goal status: INITIAL   3.  Pain to be no more than 4/10 at worst  Baseline:  Goal status: INITIAL   4.  Will score at least 22/24 on DGI to show reduced fall risk  Baseline:  Goal status: INITIAL   5.  Will be able to walk at least 571f carrying grandchild without feelings of falling or dropping the child  Baseline:  Goal status: INITIAL     PLAN:   PT FREQUENCY: 2x/week   PT DURATION: 8 weeks   PLANNED INTERVENTIONS: Therapeutic exercises, Therapeutic activity, Neuromuscular re-education, Balance training, Gait training, Patient/Family education, Self Care, Joint mobilization, Aquatic Therapy, Dry Needling, Electrical stimulation, Spinal mobilization, Cryotherapy, Moist heat, Taping, Traction, Ultrasound, Ionotophoresis '4mg'$ /ml Dexamethasone, Manual therapy, and  Re-evaluation   PLAN FOR NEXT SESSION: *** need to do UE MMT, balance, consider cervical special tests (sorry pt late at eval so not able to get thru everything); gentle progressive motion and strength as tolerated, consider TENS      SFaustino Congress PT 07/13/2022, 11:35 AM

## 2022-07-13 NOTE — Progress Notes (Signed)
ESR and CRP NWL.  CK WNL.  RF negative.  Complements WNL.

## 2022-07-14 ENCOUNTER — Encounter: Payer: No Typology Code available for payment source | Admitting: Physical Therapy

## 2022-07-14 LAB — C3 AND C4
C3 Complement: 157 mg/dL (ref 83–193)
C4 Complement: 27 mg/dL (ref 15–57)

## 2022-07-14 LAB — ANTI-SMITH ANTIBODY: ENA SM Ab Ser-aCnc: <1 AI

## 2022-07-14 LAB — ANA: Anti Nuclear Antibody (ANA): NEGATIVE

## 2022-07-14 LAB — SEDIMENTATION RATE: Sed Rate: 11 mm/h (ref 0–20)

## 2022-07-14 LAB — RHEUMATOID FACTOR: Rheumatoid fact SerPl-aCnc: <14 IU/mL (ref <14)

## 2022-07-14 LAB — C-REACTIVE PROTEIN: CRP: 6.8 mg/L (ref <8.0)

## 2022-07-14 LAB — SJOGRENS SYNDROME-B EXTRACTABLE NUCLEAR ANTIBODY: SSB (La) (ENA) Antibody, IgG: <1 AI

## 2022-07-14 LAB — CYCLIC CITRUL PEPTIDE ANTIBODY, IGG: Cyclic Citrullin Peptide Ab: <16 UNITS

## 2022-07-14 LAB — SJOGRENS SYNDROME-A EXTRACTABLE NUCLEAR ANTIBODY: SSA (Ro) (ENA) Antibody, IgG: <1 AI

## 2022-07-14 LAB — ANTI-DNA ANTIBODY, DOUBLE-STRANDED: ds DNA Ab: <1 IU/mL

## 2022-07-14 LAB — CK: Total CK: 84 U/L (ref 29–143)

## 2022-07-14 LAB — RNP ANTIBODY: Ribonucleic Protein(ENA) Antibody, IgG: <1 AI

## 2022-07-16 ENCOUNTER — Ambulatory Visit
Admission: RE | Admit: 2022-07-16 | Discharge: 2022-07-16 | Disposition: A | Discharge status: Home / Self Care | Payer: No Typology Code available for payment source | Source: Ambulatory Visit | Attending: Orthopedic Surgery | Admitting: Orthopedic Surgery

## 2022-07-16 DIAGNOSIS — M4712 Other spondylosis with myelopathy, cervical region: Secondary | ICD-10-CM

## 2022-07-17 ENCOUNTER — Encounter: Payer: No Typology Code available for payment source | Admitting: Physical Therapy

## 2022-07-17 NOTE — Progress Notes (Signed)
ANA, RNP, Smith, SSA, SSB, and dsDNA are negative.  Complements are normal.  Anti-CCP antibodies negative.  All autoimmune labs are negative.

## 2022-07-19 ENCOUNTER — Encounter: Payer: Self-pay | Admitting: Orthopedic Surgery

## 2022-07-19 DIAGNOSIS — G8929 Other chronic pain: Secondary | ICD-10-CM

## 2022-07-20 NOTE — Therapy (Addendum)
OUTPATIENT PHYSICAL THERAPY TREATMENT NOTE /DISCHARGE   Patient Name: Virginia Douglas MRN: [id number] DOB:Jun 27, 1976, 46 y.o., female Today's Date: 07/21/2022  END OF SESSION:   PT End of Session - 07/21/22 0803     Visit Number 3    Number of Visits 17    Date for PT Re-Evaluation 09/01/22    Authorization Type [employer]    Authorization - Number of Visits 120    PT Start Time 0803    PT Stop Time 0837    PT Time Calculation (min) 34 min    Activity Tolerance Patient tolerated treatment well;Patient limited by pain    Behavior During Therapy WFL for tasks assessed/performed              Past Medical History:  Diagnosis Date   ADHD (attention deficit hyperactivity disorder)    Anemia    iron deficiency   as a teenager   Anxiety    Arthritis    Diabetes ([place])    Edema    GERD (gastroesophageal reflux disease)    Hypertension    patient denies- patient stated that the PCP said it was for heart prevention.   Migraine headache    Past Surgical History:  Procedure Laterality Date   ABDOMINAL HYSTERECTOMY  2012   CHOLECYSTECTOMY     COLONOSCOPY  12/28/2020   polyps   ESOPHAGEAL DILATION     gamma knife     04/2022   IR ANGIO INTRA EXTRACRAN SEL COM CAROTID INNOMINATE BILAT MOD SED  05/31/2021   IR ANGIO INTRA EXTRACRAN SEL COM CAROTID INNOMINATE BILAT MOD SED  06/27/2021   IR ANGIO INTRA EXTRACRAN SEL COM CAROTID INNOMINATE UNI R MOD SED  11/21/2021   IR ANGIO VERTEBRAL SEL VERTEBRAL UNI R MOD SED  05/31/2021   IR CT HEAD LTD  06/27/2021   IR CT HEAD LTD  11/21/2021   IR INTRA CRAN STENT  06/27/2021   IR INTRA CRAN STENT  11/21/2021   IR RADIOLOGIST EVAL & MGMT  06/03/2021   IR RADIOLOGIST EVAL & MGMT  07/13/2021   IR RADIOLOGIST EVAL & MGMT  08/27/2021   IR RADIOLOGIST EVAL & MGMT  10/30/2021   IR RADIOLOGIST EVAL & MGMT  12/06/2021   IR US GUIDE VASC ACCESS RIGHT  05/31/2021   IR US GUIDE VASC ACCESS RIGHT  06/27/2021   IR US GUIDE VASC ACCESS RIGHT   11/21/2021   OVARIAN CYST REMOVAL     RADIOLOGY WITH ANESTHESIA N/A 06/27/2021   Procedure: STENTING;  Surgeon: Luanne Bras, MD;  Location: [place];  Service: Radiology;  Laterality: N/A;   RADIOLOGY WITH ANESTHESIA N/A 11/21/2021   Procedure: IR WITH ANESTHESIA STENTING;  Surgeon: Luanne Bras, MD;  Location: [place];  Service: Radiology;  Laterality: N/A;   Patient Active Problem List   Diagnosis Date Noted   Cerebral venous thrombosis of sigmoid sinus 11/21/2021   Pulsatile tinnitus of right ear 11/21/2021   Pulsatile tinnitus, bilateral 06/27/2021   AVM (arteriovenous malformation) brain 05/31/2021   DDD (degenerative disc disease), cervical 12/06/2020   DDD (degenerative disc disease), lumbar 12/06/2020   Gastroesophageal reflux disease with esophagitis without hemorrhage 12/06/2020   Increased intracranial pressure 12/06/2020   PTSD (post-traumatic stress disorder) 12/06/2020   Attention deficit hyperactivity disorder (ADHD), predominantly inattentive type 12/06/2020   Vitamin D deficiency 12/06/2020   Obesity (BMI 30-39.9) 03/15/2020   Sciatic nerve pain 12/10/2019   Acute pain of left shoulder 06/05/2019   Grief 06/05/2019  Migraine 06/05/2019   Type 2 diabetes mellitus without complication, without long-term current use of insulin ([place]) 04/07/2019   Depression 04/07/2019   Obesity due to excess calories without serious comorbidity 04/07/2019   Left leg pain 04/07/2019   Anxiety 04/07/2019   Headache 07/06/2018   Diabetes ([place]) 12/08/2015   Essential hypertension 12/08/2015   Smoker 12/08/2015   LLQ pain 12/08/2015   Paresthesia 09/16/2015   Urinary urgency 09/16/2015     THERAPY DIAG:  Cervicalgia  Muscle weakness (generalized)  Abnormal posture  Unsteadiness on feet  Other symptoms and signs involving the musculoskeletal system  Rationale for Evaluation and Treatment: Rehabilitation   ONSET DATE: 06/29/2022    SUBJECTIVE:                                                                                                                                                                                                           SUBJECTIVE STATEMENT: Reports the pain is worse, happening more frequent and more intense.  New onset of tremor type symptoms when trying to drink a cup of coffee.      PERTINENT HISTORY:  Assessment: Patient is a 45 y.o. female with neck pain that radiates into her left shoulder.  She had a positive Spurling.  She also complained of imbalance and hand dexterity issues.  She had a positive Romberg and instability with tandem gait.  Her M JOA score was 13 so this could be a myeloradiculopathy     Plan: -Patient has imbalance and dexterity issues, so recommended MRI of the cervical spine to evaluate for myelopathy -Referred her to physical therapy for her neck pain -Patient has tried activity modification, Tylenol -Recommended she continue with the Tylenol.  Said she could take 1045m with each meal -Patient should return to office in 4 weeks, repeat x-rays of cervical spine at next visit: None     Patient expressed understanding of the plan and all questions were answered to the patient's satisfaction.    PAIN:  Are you having pain? Yes: NPRS scale: 8/10; can get to 9/10 at worst  Pain location: back of neck (Rt worse than Lt now) side going into shoulder, down back and arm Pain description: sharp pain, feelings of weakness, stabbing, stiff, burning Aggravating factors: sitting or standing for a long time, walking makes it worse  Relieving factors: massage, heat, tylenol    PRECAUTIONS: Fall   WEIGHT BEARING RESTRICTIONS: No   FALLS:  Has patient fallen in last 6 months? Yes. Number of falls 2- "I'm clumsy, legs get weak"  one fall legs gave way, another fall I missed a step; no FOF   LIVING ENVIRONMENT: Lives with: lives alone Lives in: House/apartment Stairs: 14 steps, B rails cannot reach them at the  same time Has following equipment at home: None   OCCUPATION: computer work    PLOF: Independent, Independent with basic ADLs, Independent with gait, and Independent with transfers   PATIENT GOALS: no more pain, be able to move neck like a normal person    NEXT MD VISIT:    OBJECTIVE:    DIAGNOSTIC FINDINGS:      PATIENT SURVEYS:  FOTO 32   COGNITION: Overall cognitive status: Within functional limits for tasks assessed   SENSATION: Hx of neuropathy in B feet and hands, DNT today (time limits of session)   POSTURE: rounded shoulders, forward head, decreased lumbar lordosis, increased thoracic kyphosis, and flexed trunk    PALPATION: Multiple trigger points in B thoracic and cervical paraspinals, upper traps, suboccipitals; very TTP        CERVICAL ROM:    Active ROM A/PROM (deg) eval  Flexion 25   Extension 10 before she compensates with thoracic extension   Right lateral flexion 15  Left lateral flexion 14  Right rotation Severe limitation   Left rotation Extreme limitation, unable to perform without compensation   (Blank rows = not tested)   Thoracic ROM: flexion severe limitation, extension extreme limitation, lateral flexion severe limitation B, rotation moderate limitation B   UPPER EXTREMITY MMT: 07/21/22: Grip Strength Rt: 19.0# / Lt: 13.5# Rt shoulder flexion/abduction: 3+/5, 4/5; Lt shoulder flexion/abduction    EVAL:  Cervical strength: flexion 3+/5, lateral flexion R 3/5 L 3/5 pain limited, extension 2/5 multiple compensations, rotation R 3+/5 L 3/5 sitting      CERVICAL SPECIAL TESTS:  07/21/22: Spurling's and Distraction: Negative without change in symptoms     TODAY'S TREATMENT:  DATE: 07/21/22 Manual See special tests/MMT noted above STM with compression to bil UT, skilled palpation and monitoring of soft tissue during DN Use of theracane for TPR at home - instructed in how to purchase  Trigger Point Dry-Needling  Treatment  instructions: Expect mild to moderate muscle soreness. S/S of pneumothorax if dry needled over a lung field, and to seek immediate medical attention should they occur. Patient verbalized understanding of these instructions and education.  Patient Consent Given: Yes Education handout provided: Yes Muscles treated: Lt upper trap Electrical stimulation performed: No Parameters: N/A Treatment response/outcome: twitch response with increased soreness following; deferred Rt side due to pain  TherEx Discussed HEP and working to complete as able Seated upper trap stretch 2x30 sec Lt side   DATE: 07/10/22 Seated Cervical Sidebending AROM   Oh flexion with 1lb bar 2x10  Chest press with 1lb bar 2x10  Supine Cervical Rotation AROM   Seated SNAG's 10 x to L only.  Seated Cervical Extension AROM 2x10   Seated Thoracic Lumbar Extension   UBE 3 min forward/ 3 min backward  Self Care:  Further discussion about symptoms. Requested pt follow up with MD for a full blood panel.  Eval   Eval/education as appropriate   UBE 3 min forward/3 backward L1     PATIENT EDUCATION:  Education details: exam findings, POC, HEP  Person educated: Patient Education method: Consulting civil engineer, Demonstration, and Handouts Education comprehension: verbalized understanding, returned demonstration, and needs further education   HOME EXERCISE PROGRAM:   [id number]   ASSESSMENT:   CLINICAL IMPRESSION: Pt continues to have elevated pain at this time with limited relieving factors.  She does have several active trigger points with trial of DN today.  May benefit from cupping as DN was very painful today.  Plan to see how she responds to DN and follow up with MD to review MRI results and will determine next steps going forward.   OBJECTIVE IMPAIRMENTS: decreased balance, decreased coordination, decreased  ROM, decreased strength, hypomobility, increased fascial restrictions, increased muscle spasms, impaired flexibility, impaired sensation, impaired UE functional use, improper body mechanics, postural dysfunction, and pain.    ACTIVITY LIMITATIONS: carrying, lifting, bed mobility, bathing, dressing, self feeding, reach over head, and hygiene/grooming   PARTICIPATION LIMITATIONS: meal prep, cleaning, laundry, driving, shopping, community activity, and occupation   PERSONAL FACTORS: Age, Behavior pattern, Fitness, Past/current experiences, Social background, and Time since onset of injury/illness/exacerbation are also affecting patient's functional outcome.    REHAB POTENTIAL: Good   CLINICAL DECISION MAKING: Evolving/moderate complexity   EVALUATION COMPLEXITY: Moderate     GOALS: Goals reviewed with patient? Yes   SHORT TERM GOALS: Target date: 08/04/2022    Will be compliant with appropriate progressive HEP  Baseline:  Goal status: INITIAL   2.  Cervical ROM to improve by at least 25 degrees all planes of motion  Baseline:  Goal status: INITIAL   3.  Thoracic ROM to be no more than moderately limited all planes of motion  Baseline:  Goal status: INITIAL   4.  Pain to be no more than 6/10 at worst  Baseline:  Goal status: INITIAL   5.  Will have good understanding of biomechanics/ergonomics for work Baseline:  Goal status: INITIAL       LONG TERM GOALS: Target date: 09/01/2022   MMT all weak groups to have improved by at least 1 grade  Baseline:  Goal status: INITIAL   2.  Will report 50% reduction in frequency of dropping items/will demonstrate at least a 15# improvement in B grip strength Baseline:  Goal status: INITIAL   3.  Pain to be no more than 4/10 at worst  Baseline:  Goal status: INITIAL   4.  Will score at least 22/24 on DGI to show reduced fall risk  Baseline:  Goal status: INITIAL   5.  Will be able to walk at least 546f carrying grandchild  without feelings of falling or dropping the child  Baseline:  Goal status: INITIAL     PLAN:   PT FREQUENCY: 2x/week   PT DURATION: 8 weeks   PLANNED INTERVENTIONS: Therapeutic exercises, Therapeutic activity, Neuromuscular re-education, Balance training, Gait training, Patient/Family education, Self Care, Joint mobilization, Aquatic Therapy, Dry Needling, Electrical stimulation, Spinal mobilization, Cryotherapy, Moist heat, Taping, Traction, Ultrasound, Ionotophoresis 428mml Dexamethasone, Manual therapy, and Re-evaluation   PLAN FOR NEXT SESSION: gentle progressive motion and strength as tolerated, consider TENS (pt has one at home), assess response to DN, ?cupping (will need to get on Brian's schedule)     StLaureen AbrahamsPT, DPT 07/21/22 8:42 AM  PHYSICAL THERAPY DISCHARGE SUMMARY  Visits from Start of Care: 3  Current functional level related to goals /  functional outcomes: See note   Remaining deficits: See note   Education / Equipment: HEP  Patient goals were partially met. Patient is being discharged due to not returning since the last visit.  Scot Jun, PT, DPT, OCS, ATC 08/31/22  11:31 AM

## 2022-07-21 ENCOUNTER — Ambulatory Visit (INDEPENDENT_AMBULATORY_CARE_PROVIDER_SITE_OTHER): Payer: No Typology Code available for payment source | Admitting: Physical Therapy

## 2022-07-21 ENCOUNTER — Encounter: Payer: Self-pay | Admitting: Physical Therapy

## 2022-07-21 DIAGNOSIS — R2681 Unsteadiness on feet: Secondary | ICD-10-CM

## 2022-07-21 DIAGNOSIS — M6281 Muscle weakness (generalized): Secondary | ICD-10-CM

## 2022-07-21 DIAGNOSIS — R29898 Other symptoms and signs involving the musculoskeletal system: Secondary | ICD-10-CM

## 2022-07-21 DIAGNOSIS — R293 Abnormal posture: Secondary | ICD-10-CM | POA: Diagnosis not present

## 2022-07-21 DIAGNOSIS — M542 Cervicalgia: Secondary | ICD-10-CM | POA: Diagnosis not present

## 2022-07-22 NOTE — Telephone Encounter (Signed)
Orthopedic Plan of Care  Patient has messaged me in my chart and has tried multiple medications that are not helping with her pain. She asked to be referred to pain management which I think is reasonable. A referral was provided to her today. No other plans have changed from my last office visit with her.  Callie Fielding, MD Orthopedic Spine Surgeon

## 2022-07-24 ENCOUNTER — Encounter: Payer: No Typology Code available for payment source | Admitting: Physical Therapy

## 2022-07-25 ENCOUNTER — Encounter: Payer: Self-pay | Admitting: Nurse Practitioner

## 2022-07-25 ENCOUNTER — Ambulatory Visit (INDEPENDENT_AMBULATORY_CARE_PROVIDER_SITE_OTHER): Payer: No Typology Code available for payment source | Admitting: Nurse Practitioner

## 2022-07-25 VITALS — BP 122/87 | Temp 98.2°F | Ht 66.0 in | Wt 216.0 lb

## 2022-07-25 DIAGNOSIS — Z0000 Encounter for general adult medical examination without abnormal findings: Secondary | ICD-10-CM

## 2022-07-25 DIAGNOSIS — Z79899 Other long term (current) drug therapy: Secondary | ICD-10-CM

## 2022-07-25 DIAGNOSIS — I10 Essential (primary) hypertension: Secondary | ICD-10-CM | POA: Diagnosis not present

## 2022-07-25 DIAGNOSIS — F419 Anxiety disorder, unspecified: Secondary | ICD-10-CM

## 2022-07-25 DIAGNOSIS — E1169 Type 2 diabetes mellitus with other specified complication: Secondary | ICD-10-CM | POA: Diagnosis not present

## 2022-07-25 DIAGNOSIS — Z6830 Body mass index (BMI) 30.0-30.9, adult: Secondary | ICD-10-CM

## 2022-07-25 DIAGNOSIS — K5909 Other constipation: Secondary | ICD-10-CM

## 2022-07-25 DIAGNOSIS — R9431 Abnormal electrocardiogram [ECG] [EKG]: Secondary | ICD-10-CM

## 2022-07-25 DIAGNOSIS — E6609 Other obesity due to excess calories: Secondary | ICD-10-CM

## 2022-07-25 DIAGNOSIS — Z23 Encounter for immunization: Secondary | ICD-10-CM

## 2022-07-25 DIAGNOSIS — E66811 Obesity, class 1: Secondary | ICD-10-CM

## 2022-07-25 LAB — POCT URINALYSIS DIPSTICK
Blood, UA: NEGATIVE
Glucose, UA: NEGATIVE
Leukocytes, UA: NEGATIVE
Nitrite, UA: NEGATIVE
Protein, UA: POSITIVE — AB
Spec Grav, UA: 1.025 (ref 1.010–1.025)
Urobilinogen, UA: 0.2 E.U./dL
pH, UA: 7.5 (ref 5.0–8.0)

## 2022-07-25 MED ORDER — LINACLOTIDE 72 MCG PO CAPS: 72.0000 ug | ORAL_CAPSULE | Freq: Every day | ORAL | 1 refills | Status: DC

## 2022-07-25 MED ORDER — HPV 9-VALENT RECOMB VACCINE IM SUSY: 0.5000 mL | PREFILLED_SYRINGE | Freq: Once | INTRAMUSCULAR | 0 refills | Status: AC

## 2022-07-25 NOTE — Progress Notes (Signed)
I,Victoria T Hamilton,acting as a Education administrator for [name], FNP.,have documented all relevant documentation on the behalf of [name], FNP,as directed by  [name], FNP while in the presence of [name], [place].  Subjective:     Patient ID: Virginia Douglas , female    DOB: February 08, 1976 , 46 y.o.   MRN: [id number]   Chief Complaint  Patient presents with   Annual Exam    HPI  Pt here for HM.  She had Cerebral Stenting with Dr Estanislado Pandy on the left side. She is to have the right side done at the beginning of the year.    Patient reports being est with therapist [facility], [facility]. She has a upcoming appt tomorrow. She feels better when she leaves her sessions. She is having more anxiety recently.   Dr Laurance Flatten - orthopedic has her PT and may need to go to pain management.   Diabetes She presents for her follow-up diabetic visit. She has type 2 diabetes mellitus. Her disease course has been stable. Hypoglycemia symptoms include nervousness/anxiousness. Pertinent negatives for hypoglycemia include no confusion or dizziness. There are no diabetic associated symptoms. Pertinent negatives for diabetes include no chest pain, no fatigue, no polydipsia, no polyphagia and no polyuria. There are no hypoglycemic complications. Symptoms are stable. There are no diabetic complications. Risk factors for coronary artery disease include hypertension, diabetes mellitus, sedentary lifestyle and obesity. She is compliant with treatment all of the time. She is following a generally healthy diet. When asked about meal planning, she reported none. She has not had a previous visit with a dietitian. She rarely participates in exercise. There is no change in her home blood glucose trend. An ACE inhibitor/angiotensin II receptor blocker is being taken. Eye exam current: she is seeing an opthalmologist.     Past Medical History:  Diagnosis Date   ADHD (attention deficit hyperactivity disorder)     Anemia    iron deficiency   as a teenager   Anxiety    Arthritis    Diabetes ([place])    Edema    GERD (gastroesophageal reflux disease)    Hypertension    patient denies- patient stated that the PCP said it was for heart prevention.   Migraine headache      Family History  Problem Relation Age of Onset   Diabetes Mother    Hypertension Mother    Diabetes Father    Hypertension Father    Kidney failure Father    Hypertension Brother    Cancer Maternal Grandmother    Diabetes Maternal Grandmother    Pancreatic cancer Maternal Grandmother    Diabetes Paternal Grandmother    Breast cancer Paternal Grandmother    Stroke Paternal Grandfather    Healthy Son    Healthy Son    Cancer Maternal Aunt        breast   Breast cancer Maternal Aunt      Current Outpatient Medications:    acetaZOLAMIDE (DIAMOX) 250 MG tablet, TAKE 1 TABLET BY MOUTH TWICE A DAY, Disp: 60 tablet, Rfl: 2   AIMOVIG 140 MG/ML SOAJ, INJECT 140 MG INTO THE SKIN EVERY 30 DAYS, Disp: 1 mL, Rfl: 2   amoxicillin (AMOXIL) 875 MG tablet, Take 1 tablet (875 mg total) by mouth 2 (two) times daily., Disp: 14 tablet, Rfl: 0   aspirin EC 81 MG tablet, Take 81 mg by mouth daily. Swallow whole., Disp: , Rfl:    atorvastatin (LIPITOR) 10 MG tablet, TAKE 1 TABLET  BY MOUTH EVERY DAY, Disp: 90 tablet, Rfl: 1   Blood Glucose Monitoring Suppl (ONETOUCH VERIO IQ SYSTEM) w/Device KIT, by Does not apply route. Use to check blood sugars twice, Disp: , Rfl:    Boric Acid Vaginal (AZO BORIC ACID) 600 MG SUPP, Place 1 suppository vaginally at bedtime., Disp: 14 suppository, Rfl: 0   buPROPion (WELLBUTRIN XL) 150 MG 24 hr tablet, TAKE 1 TABLET BY MOUTH EVERY DAY IN THE MORNING, Disp: 90 tablet, Rfl: 1   cetirizine (ZYRTEC) 10 MG tablet, Take 10 mg by mouth daily as needed for allergies., Disp: , Rfl:    chlorhexidine (PERIDEX) 0.12 % solution, 5 mLs by Mouth Rinse route daily., Disp: , Rfl:    clonazePAM (KLONOPIN) 0.5 MG tablet, Take 1  tablet by mouth three times a day as needed, Disp: 30 tablet, Rfl: 1   clopidogrel (PLAVIX) 75 MG tablet, Take 1 tablet (75 mg total) by mouth daily., Disp: 90 tablet, Rfl: 2   Dulaglutide (TRULICITY) 1.5 VQ/0.0QQ SOPN, Inject 1.5 mg into the skin once a week., Disp: 6 mL, Rfl: 1   DULoxetine (CYMBALTA) 60 MG capsule, TAKE 1 CAPSULE BY MOUTH EVERY DAY, Disp: 90 capsule, Rfl: 1   famotidine (PEPCID) 20 MG tablet, Take 20 mg by mouth daily., Disp: , Rfl:    glucose blood test strip, 1 each by Other route as needed for other. Insert 1 by subcutaneous route 2 times every day check blood sugar before breakfast and dinner, Disp: , Rfl:    hpv 9-valent vaccine (GARDASIL 9) prefilled syringe, Inject 0.5 mLs into the muscle once for 1 dose., Disp: 0.5 mL, Rfl: 0   hydrOXYzine (VISTARIL) 50 MG capsule, TAKE 1-2 CAPSULES BY MOUTH AS NEEDED FOR SLEEP AT BEDTIME, Disp: 180 capsule, Rfl: 1   Insulin Pen Needle (PEN NEEDLES) 32G X 4 MM MISC, 1 each by Does not apply route once a week., Disp: 30 each, Rfl: 3   Lasmiditan Succinate (REYVOW) 100 MG TABS, Take 1 tablet by mouth daily as needed., Disp: 16 tablet, Rfl: 5   lisinopril-hydrochlorothiazide (ZESTORETIC) 10-12.5 MG tablet, TAKE 1 TABLET BY MOUTH EVERY DAY, Disp: 90 tablet, Rfl: 1   omeprazole (PRILOSEC) 40 MG capsule, Take 40 mg by mouth daily., Disp: , Rfl:    ondansetron (ZOFRAN-ODT) 4 MG disintegrating tablet, 4 mg daily as needed for nausea or vomiting., Disp: , Rfl:    phentermine (ADIPEX-P) 37.5 MG tablet, Take 1 tablet (37.5 mg total) by mouth daily before breakfast., Disp: 30 tablet, Rfl: 1   Polyethyl Glycol-Propyl Glycol (SYSTANE) 0.4-0.3 % SOLN, Place 1 drop into both eyes daily., Disp: , Rfl:    saccharomyces boulardii (FLORASTOR) 250 MG capsule, Take 250 mg by mouth daily. gummy, Disp: , Rfl:    tiZANidine (ZANAFLEX) 4 MG tablet, Take 4 mg by mouth daily as needed for muscle spasms., Disp: , Rfl:    topiramate (TOPAMAX) 50 MG tablet, TAKE 2 AND  1/2 TABLETS (125 MG TOTAL) BY MOUTH AT BEDTIME., Disp: 225 tablet, Rfl: 1   Vitamin D, Ergocalciferol, (DRISDOL) 1.25 MG (50000 UNIT) CAPS capsule, TAKE 1 CAPSULE BY MOUTH EVERY 7 DAYS **NOT COVERED**, Disp: 12 capsule, Rfl: 0   ADDERALL XR 30 MG 24 hr capsule, Take 30 mg by mouth daily. (Patient not taking: Reported on 05/12/2022), Disp: , Rfl:    linaclotide (LINZESS) 72 MCG capsule, Take 1 capsule (72 mcg total) by mouth daily before breakfast., Disp: 90 capsule, Rfl: 1   No Known Allergies  The patient states she is status post hysterectomy.  No LMP recorded. Patient has had a hysterectomy.  Negative for Dysmenorrhea and Negative for Menorrhagia. Negative for: breast discharge, breast lump(s), breast pain and breast self exam. Associated symptoms include abnormal vaginal bleeding. Pertinent negatives include abnormal bleeding (hematology), anxiety, decreased libido, depression, difficulty falling sleep, dyspareunia, history of infertility, nocturia, sexual dysfunction, sleep disturbances, urinary incontinence, urinary urgency, vaginal discharge and vaginal itching. Diet regular.The patient states her exercise level is minimal - she is having neck and back pain.   The patient's tobacco use is:  Social History   Tobacco Use  Smoking Status Former   Packs/day: 0.50   Years: 25.00   Total pack years: 12.50   Types: Cigarettes   Start date: 03/24/2019   Passive exposure: Current  Smokeless Tobacco Never  Tobacco Comments   06/24/21- quit over a year ago   She has been exposed to passive smoke. The patient's alcohol use is:  Social History   Substance and Sexual Activity  Alcohol Use No   Alcohol/week: 0.0 standard drinks of alcohol   Additional information: Last pap 01/2022, next one scheduled for 01/2025.    Review of Systems  Constitutional: Negative.  Negative for fatigue.  HENT: Negative.    Eyes: Negative.   Respiratory: Negative.    Cardiovascular: Negative.  Negative for  chest pain, palpitations and leg swelling.  Gastrointestinal: Negative.   Endocrine: Negative.  Negative for polydipsia, polyphagia and polyuria.  Genitourinary: Negative.   Musculoskeletal: Negative.   Skin: Negative.   Allergic/Immunologic: Negative.   Neurological: Negative.  Negative for dizziness.  Hematological: Negative.   Psychiatric/Behavioral:  Negative for confusion. The patient is nervous/anxious.      Today's Vitals   07/25/22 0853  BP: 122/87  Temp: 98.2 F (36.8 C)  SpO2: 98%  Weight: 216 lb (98 kg)  Height: _0  (1.676 m)   Body mass index is 34.86 kg/m.  Wt Readings from Last 3 Encounters:  07/25/22 216 lb (98 kg)  06/29/22 208 lb 3.2 oz (94.4 kg)  05/12/22 208 lb 3.2 oz (94.4 kg)    Objective:  Physical Exam Vitals reviewed.  Constitutional:      General: She is not in acute distress.    Appearance: Normal appearance. She is well-developed. She is obese.  HENT:     Head: Normocephalic and atraumatic.     Right Ear: Hearing, tympanic membrane, ear canal and external ear normal. There is no impacted cerumen.     Left Ear: Hearing, tympanic membrane, ear canal and external ear normal. There is no impacted cerumen.     Nose:     Comments: Deferred - masked    Mouth/Throat:     Comments: Deferred - masked Eyes:     General: Lids are normal.     Extraocular Movements: Extraocular movements intact.     Conjunctiva/sclera: Conjunctivae normal.     Pupils: Pupils are equal, round, and reactive to light.     Funduscopic exam:    Right eye: No papilledema.        Left eye: No papilledema.  Neck:     Thyroid: No thyroid mass.     Vascular: No carotid bruit.  Cardiovascular:     Rate and Rhythm: Normal rate and regular rhythm.     Pulses: Normal pulses.     Heart sounds: Normal heart sounds. No murmur heard. Pulmonary:     Effort: Pulmonary effort is normal. No respiratory distress.  Breath sounds: Normal breath sounds. No wheezing.  Chest:      Chest wall: No mass.  Breasts:    Tanner Score is 5.     Right: Normal. No mass or tenderness.     Left: Normal. No mass or tenderness.  Abdominal:     General: Abdomen is flat. Bowel sounds are normal. There is no distension.     Palpations: Abdomen is soft.     Tenderness: There is no abdominal tenderness.  Genitourinary:    Comments: Deferred - followed by GYN Musculoskeletal:        General: No swelling or tenderness.     Right shoulder: No swelling or tenderness. Normal range of motion.     Left shoulder: No swelling, deformity or tenderness. Normal range of motion. Normal strength. Normal pulse.     Cervical back: Full passive range of motion without pain, normal range of motion and neck supple.     Right lower leg: No edema.     Left lower leg: No edema.  Lymphadenopathy:     Upper Body:     Right upper body: No supraclavicular, axillary or pectoral adenopathy.     Left upper body: No supraclavicular, axillary or pectoral adenopathy.  Skin:    General: Skin is warm and dry.     Capillary Refill: Capillary refill takes less than 2 seconds.     Comments: Several tattoos to arms  Neurological:     General: No focal deficit present.     Mental Status: She is alert and oriented to person, place, and time.     Cranial Nerves: No cranial nerve deficit.     Sensory: No sensory deficit.  Psychiatric:        Mood and Affect: Mood normal.        Behavior: Behavior normal.        Thought Content: Thought content normal.        Judgment: Judgment normal.         Assessment And Plan:     1. Encounter for annual health examination Behavior modifications discussed and diet history reviewed.   Pt will continue to exercise regularly and modify diet with low GI, plant based foods and decrease intake of processed foods.  Recommend intake of daily multivitamin, Vitamin D, and calcium.  Recommend mammogram and colonoscopy (both are up to date) for preventive screenings, as well as  recommend immunizations that include influenza, TDAP  2. Class 1 obesity due to excess calories with body mass index (BMI) of 30.0 to 30.9 in adult, unspecified whether serious comorbidity present Chronic Discussed healthy diet and regular exercise options  Encouraged to exercise at least 150 minutes per week with 2 days of strength training Waist circumference is 41 in  3. Encounter for immunization Comments: Rx for HPV sent to pharmacy - hpv 9-valent vaccine (GARDASIL 9) prefilled syringe; Inject 0.5 mLs into the muscle once for 1 dose.  Dispense: 0.5 mL; Refill: 0  4. Diabetes mellitus type 2 in obese [facility]) Comments: Will check her HgbA1c. blood sugar was up earlier this week. Continue current medications. - POCT Urinalysis Dipstick (81002) - Microalbumin / creatinine urine ratio - EKG 12-Lead - CMP14+EGFR - Hemoglobin A1c - Lipid panel  5. Essential hypertension Comments: Blood pressure is slightly elevated. Continue current medications. EKG done with bundle branch block which looks new and HR 80  6. Anxiety Comments: Continue f/u with therapist.  7. Other constipation Comments: She has tried over the counter  miralax without relief. Will start Linzess has been on this before with good results. - linaclotide (LINZESS) 72 MCG capsule; Take 1 capsule (72 mcg total) by mouth daily before breakfast.  Dispense: 90 capsule; Refill: 1  8. Abnormal EKG Comments: She has a bundle branch block which is new, I will refer to Cardiology due to her history of diabetes - Ambulatory referral to Cardiology  9. Other long term (current) drug therapy - CBC   Patient was given opportunity to ask questions. Patient verbalized understanding of the plan and was able to repeat key elements of the plan. All questions were answered to their satisfaction.   [name], FNP   I, [name], FNP, have reviewed all documentation for this visit. The documentation on 07/25/22 for the exam,  diagnosis, procedures, and orders are all accurate and complete.   THE PATIENT IS ENCOURAGED TO PRACTICE SOCIAL DISTANCING DUE TO THE COVID-19 PANDEMIC.

## 2022-07-25 NOTE — Patient Instructions (Signed)

## 2022-07-26 ENCOUNTER — Ambulatory Visit (INDEPENDENT_AMBULATORY_CARE_PROVIDER_SITE_OTHER): Payer: No Typology Code available for payment source | Admitting: Orthopedic Surgery

## 2022-07-26 ENCOUNTER — Encounter: Payer: No Typology Code available for payment source | Admitting: Physical Therapy

## 2022-07-26 DIAGNOSIS — M67912 Unspecified disorder of synovium and tendon, left shoulder: Secondary | ICD-10-CM | POA: Diagnosis not present

## 2022-07-26 LAB — CBC
Hematocrit: 41.1 % (ref 34.0–46.6)
Hemoglobin: 14 g/dL (ref 11.1–15.9)
MCH: 28.3 pg (ref 26.6–33.0)
MCHC: 34.1 g/dL (ref 31.5–35.7)
MCV: 83 fL (ref 79–97)
Platelets: 320 10*3/uL (ref 150–450)
RBC: 4.95 x10E6/uL (ref 3.77–5.28)
RDW: 13.4 % (ref 11.7–15.4)
WBC: 9.5 10*3/uL (ref 3.4–10.8)

## 2022-07-26 LAB — CMP14+EGFR
ALT: 22 IU/L (ref 0–32)
AST: 17 IU/L (ref 0–40)
Albumin/Globulin Ratio: 2.2 (ref 1.2–2.2)
Albumin: 5 g/dL — ABNORMAL HIGH (ref 3.9–4.9)
Alkaline Phosphatase: 144 IU/L — ABNORMAL HIGH (ref 44–121)
BUN/Creatinine Ratio: 10 (ref 9–23)
BUN: 7 mg/dL (ref 6–24)
Bilirubin Total: 0.3 mg/dL (ref 0.0–1.2)
CO2: 23 mmol/L (ref 20–29)
Calcium: 10.3 mg/dL — ABNORMAL HIGH (ref 8.7–10.2)
Chloride: 101 mmol/L (ref 96–106)
Creatinine, Ser: 0.71 mg/dL (ref 0.57–1.00)
Globulin, Total: 2.3 g/dL (ref 1.5–4.5)
Glucose: 86 mg/dL (ref 70–99)
Potassium: 4.7 mmol/L (ref 3.5–5.2)
Sodium: 139 mmol/L (ref 134–144)
Total Protein: 7.3 g/dL (ref 6.0–8.5)
eGFR: 107 mL/min/{1.73_m2} (ref >59)

## 2022-07-26 LAB — HEMOGLOBIN A1C
Est. average glucose Bld gHb Est-mCnc: 146 mg/dL
Hgb A1c MFr Bld: 6.7 % — ABNORMAL HIGH (ref 4.8–5.6)

## 2022-07-26 LAB — LIPID PANEL
Chol/HDL Ratio: 3.4 ratio (ref 0.0–4.4)
Cholesterol, Total: 166 mg/dL (ref 100–199)
HDL: 49 mg/dL (ref >39)
LDL Chol Calc (NIH): 99 mg/dL (ref 0–99)
Triglycerides: 95 mg/dL (ref 0–149)
VLDL Cholesterol Cal: 18 mg/dL (ref 5–40)

## 2022-07-26 LAB — MICROALBUMIN / CREATININE URINE RATIO
Creatinine, Urine: 227.7 mg/dL
Microalb/Creat Ratio: 6 mg/g creat (ref 0–29)
Microalbumin, Urine: 13.3 ug/mL

## 2022-07-26 NOTE — Progress Notes (Addendum)
Orthopedic Spine Surgery Office Note  Assessment: Patient is a 46 y.o. female with left trapezius and shoulder pain. Worse with overheard activity and has pain at night. Had pain with jobe test and had tenderness to palpation over anterior shoulder and trapezius. Her MRI cervical spine was not consistent with myelopathy and did not show any significant foraminal stenosis to explain the pain   Plan: -I explained that I think more of her symptoms are coming from the left shoulder as opposed to the cervical spine in light of her MRI and exam today.  -Patient has tried activity modification, tylenol, PT -She has a referral to pain management for some of her chronic pain -Recommended she continue with PT and the home exercises, heat to the area in the mornings, voltaren gel to the shoulder -If she is no better at our next visit, will order MRI of left shoulder to evaluate -Patient should return to office in 4 weeks, repeat x-rays at next visit: left shoulder grashey/scapular Y/axillary   Patient expressed understanding of the plan and all questions were answered to the patient's satisfaction.   __________________________________________________________________________  History: Patient is a 46 y.o. female who has been previously seen in the office for symptoms consistent with cervical radiculopathy. Since the last visit, symptom intensity has remained the same. She has no new symptoms. Reports that pain is worse at night and with overhead activity. She has done 4 weeks of PT for her neck and shoulder pain with no significant relief. She has been using tylenol regularly. Has neuropathy. No new numbness or paresthesias. Still feels she has clumsiness with her hands.   Previous treatments: PT, activity modification, tylenol  COPY OF PRIOR NOTE Patient is a 46 y.o. female who presents today for cervical spine.  Patient reports a 1 year history of neck pain that radiates into her left shoulder.  Does  not go past the deltoid.  There is no trauma or injury that brought on the pain.  She feels this pain is gotten progressively worse with time.  She notes that rest makes it better.  She also reported feeling off balance and having unsteadiness with gait.  She states that she is unable to go up the stairs without using a handrail due to her unsteadiness.  She also has clumsiness in her hands.  States that she drops objects involuntarily at times.  Reports decreased sensation in her bilateral hands that is mild.     Weakness: Yes, reports feeling weaker in her hands Difficulty with fine motor skills (e.g., buttoning shirts, handwriting): Yes, has trouble buttoning shirts and will at times drop objects Symptoms of imbalance: Yes Paresthesias and numbness: Yes reports decreased sensation in her bilateral hands and feet.  Does report a history of neuropathy Bowel or bladder incontinence: Denies Saddle anesthesia: Denies END OF COPY  Physical Exam:  General: no acute distress, appears stated age Neurologic: alert, answering questions appropriately, following commands Respiratory: unlabored breathing on room air, symmetric chest rise Psychiatric: appropriate affect, normal cadence to speech   MSK (spine):  -Strength exam      Left  Right Grip strength                5/5  5/5 Interosseus   5/5   5/5 Wrist extension  5/5  5/5 Wrist flexion   5/5  5/5 Elbow flexion   5/5  5/5 Deltoid    5/5  5/5  EHL    5/5  5/5 TA  5/5  5/5 GSC    5/5  5/5 Knee extension  5/5  5/5 Hip flexion   5/5  5/5  -Sensory exam    Sensation intact to light touch in L3-S1 nerve distributions of bilateral lower extremities  Sensation intact to light touch in C5-T1 nerve distributions of bilateral upper extremities  -Spurling: positive on the left, negative on the right -Hoffman sign: negative bilaterally -No interosseous atrophy seen -Negative grip and release test  Left shoulder exam: pain with jobe  test, no weakness with external rotation with arm at side, negative belly press, negative lift off, positive speeds, tender to palpation over the anterior shoulder and trapezius Right shoulder exam: no pain through range of motion, negative jobe, negative lift off, negative belly press, no weakness with external rotation with arm at side   Imaging: XR of the cervical spine from 06/29/2022 was previously independently reviewed and interpreted, showing anterior osteophyte formation at C6/7. No evidence of instability on flexion/extension. No fracture or dislocation.    MRI of the cervical spine from 07/16/2022 was independently reviewed and interpreted, showing minimal foraminal stenosis at C7/T1. No other significant stenosis.     Patient name: Virginia Douglas Patient MRN: [id number] Date of visit: 07/26/22

## 2022-08-03 ENCOUNTER — Other Ambulatory Visit: Payer: Self-pay | Admitting: Nurse Practitioner

## 2022-08-06 NOTE — Progress Notes (Unsigned)
Cardiology Office Note:    Date:  08/07/2022   ID:  Virginia Douglas, DOB May 23, 1976, MRN [id number]  PCP:  Virginia Brine, FNP  Cardiologist:  None  Electrophysiologist:  None   Referring MD: Virginia Brine, FNP   Chief Complaint  Patient presents with   Chest Pain    History of Present Illness:    Virginia Douglas is a 46 y.o. female with a hx of GERD, hypertension, diabetes who is referred by Virginia Brine, NP for evaluation of abnormal EKG.  She reports having chest pain which occurs about once per week.  Describes as sharp pain in center of her chest, lasts for 30 seconds or so.  She does not exercise but reports she is short of breath with minimal exertion, such as walking up stairs.  She reports having lightheadedness but denies any recent syncope.  Does report lower extremity swelling.  Reports palpitations where feels like heart is racing, occurs about once every 2 weeks.  She smoked 1 pack/day x 16 years, quit age 89.  Family history includes father had CVA age 35.   Past Medical History:  Diagnosis Date   ADHD (attention deficit hyperactivity disorder)    Anemia    iron deficiency   as a teenager   Anxiety    Arthritis    Diabetes ([place])    Edema    GERD (gastroesophageal reflux disease)    Hypertension    patient denies- patient stated that the PCP said it was for heart prevention.   Migraine headache     Past Surgical History:  Procedure Laterality Date   ABDOMINAL HYSTERECTOMY  2012   CHOLECYSTECTOMY     COLONOSCOPY  12/28/2020   polyps   ESOPHAGEAL DILATION     gamma knife     04/2022   IR ANGIO INTRA EXTRACRAN SEL COM CAROTID INNOMINATE BILAT MOD SED  05/31/2021   IR ANGIO INTRA EXTRACRAN SEL COM CAROTID INNOMINATE BILAT MOD SED  06/27/2021   IR ANGIO INTRA EXTRACRAN SEL COM CAROTID INNOMINATE UNI R MOD SED  11/21/2021   IR ANGIO VERTEBRAL SEL VERTEBRAL UNI R MOD SED  05/31/2021   IR CT HEAD LTD  06/27/2021   IR CT HEAD LTD  11/21/2021   IR  INTRA CRAN STENT  06/27/2021   IR INTRA CRAN STENT  11/21/2021   IR RADIOLOGIST EVAL & MGMT  06/03/2021   IR RADIOLOGIST EVAL & MGMT  07/13/2021   IR RADIOLOGIST EVAL & MGMT  08/27/2021   IR RADIOLOGIST EVAL & MGMT  10/30/2021   IR RADIOLOGIST EVAL & MGMT  12/06/2021   IR US GUIDE VASC ACCESS RIGHT  05/31/2021   IR US GUIDE VASC ACCESS RIGHT  06/27/2021   IR US GUIDE VASC ACCESS RIGHT  11/21/2021   OVARIAN CYST REMOVAL     RADIOLOGY WITH ANESTHESIA N/A 06/27/2021   Procedure: STENTING;  Surgeon: Luanne Bras, MD;  Location: [facility];  Service: Radiology;  Laterality: N/A;   RADIOLOGY WITH ANESTHESIA N/A 11/21/2021   Procedure: IR WITH ANESTHESIA STENTING;  Surgeon: Luanne Bras, MD;  Location: [facility];  Service: Radiology;  Laterality: N/A;    Current Medications: Current Meds  Medication Sig   acetaZOLAMIDE (DIAMOX) 250 MG tablet TAKE 1 TABLET BY MOUTH TWICE A DAY   ADDERALL XR 30 MG 24 hr capsule Take 30 mg by mouth daily.   AIMOVIG 140 MG/ML SOAJ INJECT 140 MG INTO THE SKIN EVERY 30 DAYS   aspirin EC 81 MG  tablet Take 81 mg by mouth daily. Swallow whole.   atorvastatin (LIPITOR) 10 MG tablet TAKE 1 TABLET BY MOUTH EVERY DAY   Blood Glucose Monitoring Suppl (ONETOUCH VERIO IQ SYSTEM) w/Device KIT by Does not apply route. Use to check blood sugars twice   buPROPion (WELLBUTRIN XL) 150 MG 24 hr tablet TAKE 1 TABLET BY MOUTH EVERY DAY IN THE MORNING   cetirizine (ZYRTEC) 10 MG tablet Take 10 mg by mouth daily as needed for allergies.   clonazePAM (KLONOPIN) 0.5 MG tablet Take 1 tablet by mouth three times a day as needed   clopidogrel (PLAVIX) 75 MG tablet Take 1 tablet (75 mg total) by mouth daily.   Dulaglutide (TRULICITY) 1.5 ZH/2.9JM SOPN Inject 1.5 mg into the skin once a week.   DULoxetine (CYMBALTA) 60 MG capsule TAKE 1 CAPSULE BY MOUTH EVERY DAY   famotidine (PEPCID) 20 MG tablet Take 20 mg by mouth daily.   glucose blood test strip 1 each by Other route as needed for  other. Insert 1 by subcutaneous route 2 times every day check blood sugar before breakfast and dinner   hydrOXYzine (VISTARIL) 50 MG capsule TAKE 1-2 CAPSULES BY MOUTH AS NEEDED FOR SLEEP AT BEDTIME   Insulin Pen Needle (PEN NEEDLES) 32G X 4 MM MISC 1 each by Does not apply route once a week.   linaclotide (LINZESS) 72 MCG capsule Take 1 capsule (72 mcg total) by mouth daily before breakfast.   lisinopril-hydrochlorothiazide (ZESTORETIC) 10-12.5 MG tablet TAKE 1 TABLET BY MOUTH EVERY DAY   metoprolol tartrate (LOPRESSOR) 100 MG tablet Take 1 tablet (138m) TWO hours prior to CT scan   ondansetron (ZOFRAN-ODT) 4 MG disintegrating tablet 4 mg daily as needed for nausea or vomiting.   phentermine (ADIPEX-P) 37.5 MG tablet Take 1 tablet (37.5 mg total) by mouth daily before breakfast.   Polyethyl Glycol-Propyl Glycol (SYSTANE) 0.4-0.3 % SOLN Place 1 drop into both eyes daily.   tiZANidine (ZANAFLEX) 4 MG tablet Take 4 mg by mouth daily as needed for muscle spasms.   topiramate (TOPAMAX) 50 MG tablet TAKE 2 AND 1/2 TABLETS (125 MG TOTAL) BY MOUTH AT BEDTIME.   Vitamin D, Ergocalciferol, (DRISDOL) 1.25 MG (50000 UNIT) CAPS capsule TAKE 1 CAPSULE BY MOUTH EVERY 7 DAYS **NOT COVERED**     Allergies:   Patient has no known allergies.   Social History   Socioeconomic History   Marital status: Legally Separated    Spouse name: JJeneen Rinks  Number of children: 2   Years of education: 16   Highest education level: Some college, no degree  Occupational History   Occupation: AFacilities manager [employer] Tobacco Use   Smoking status: Former    Packs/day: 0.50    Years: 25.00    Total pack years: 12.50    Types: Cigarettes    Start date: 03/24/2019    Passive exposure: Current   Smokeless tobacco: Never   Tobacco comments:    06/24/21- quit over a year ago  Vaping Use   Vaping Use: Never used  Substance and Sexual Activity   Alcohol use: No    Alcohol/week: 0.0  standard drinks of alcohol   Drug use: No   Sexual activity: Yes    Birth control/protection: Surgical  Other Topics Concern   Not on file  Social History Narrative   In relationship, iAgricultural consultantin mPsychologist, 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 She lives with her 2 children in a one story house. She drinks one large cup of coffee a day and an occasional tea or soda. She walks daily.      One story home      Social Determinants of Health   Financial Resource Strain: Not on file  Food Insecurity: Not on file  Transportation Needs: Not on file  Physical Activity: Not on file  Stress: Not on file  Social Connections: Not on file     Family History: The patient's family history includes Breast cancer in her maternal aunt and paternal grandmother; Cancer in her maternal aunt and maternal grandmother; Diabetes in her father, maternal grandmother, mother, and paternal grandmother; Healthy in her son and son; Hypertension in her brother, father, and mother; Kidney failure in her father; Pancreatic cancer in her maternal grandmother; Stroke in her paternal grandfather.  ROS:   Please see the history of present illness.     All other systems reviewed and are negative.  EKGs/Labs/Other Studies Reviewed:    The following studies were reviewed today:   EKG:   07/25/2022: Normal sinus rhythm, rate 89, LVH  Recent Labs: 07/25/2022: ALT 22; BUN 7; Creatinine, Ser 0.71; Hemoglobin 14.0; Platelets 320; Potassium 4.7; Sodium 139  Recent Lipid Panel    Component Value Date/Time   CHOL 166 07/25/2022 1001   TRIG 95 07/25/2022 1001   HDL 49 07/25/2022 1001   CHOLHDL 3.4 07/25/2022 1001   LDLCALC 99 07/25/2022 1001    Physical Exam:    VS:  BP 112/76 (BP Location: Left Arm, Patient Position: Sitting, Cuff Size: Large)   Pulse (!) 104   Ht _0  (1.702 m)   Wt 223 lb  3.2 oz (101.2 kg)   SpO2 96%   BMI 34.96 kg/m     Wt Readings from Last 3 Encounters:  08/07/22 223 lb 3.2 oz (101.2 kg)  07/25/22 216 lb (98 kg)  06/29/22 208 lb 3.2 oz (94.4 kg)     GEN:  Well nourished, well developed in no acute distress HEENT: Normal NECK: No JVD; No carotid bruits LYMPHATICS: No lymphadenopathy CARDIAC: RRR, no murmurs, rubs, gallops RESPIRATORY:  Clear to auscultation without rales, wheezing or rhonchi  ABDOMEN: Soft, non-tender, non-distended MUSCULOSKELETAL:  No edema; No deformity  SKIN: Warm and dry NEUROLOGIC:  Alert and oriented x 3 PSYCHIATRIC:  Normal affect   ASSESSMENT:    1. Chest pain of uncertain etiology   2. DOE (dyspnea on exertion)   3. Snoring   4. Essential hypertension   5. Hyperlipidemia, unspecified hyperlipidemia type    PLAN:    Chest pain/dyspnea on exertion: Chest pain atypical description but also reporting dyspnea with exertion that could represent anginal equivalent.  Does have significant CAD risk factors (age, hypertension, hyperlipidemia, T2DM, former tobacco use) -Recommend coronary CTA to evaluate for obstructive CAD.  Will give Lopressor 100 mg prior to study -Echocardiogram  Palpitations: Will address further at next clinic visit, if continues to have palpitations plan Zio patch  Hypertension: On lisinopril/HCTZ 10-12.5 mg daily and acetazolamide 250 mg twice daily.  Appears controlled  Hyperlipidemia: On atorvastatin 10 mg daily.  LDL 99 07/25/22  T2DM: A1c 6.7%.  On metformin  Left sigmoid sinus stenosis: Status post stenting 06/2021.  She is on aspirin and Plavix.  Left superior cerebellar AVM: Underwent gamma knife radiosurgery at [facility]  Snoring/daytime somnolence:  Recommend an Itamar sleep study.  Stopping for  RTC in 3 months   Medication Adjustments/Labs and Tests Ordered: Current medicines are reviewed at length with the patient today.  Concerns regarding medicines are outlined above.  Orders  Placed This Encounter  Procedures   CT CORONARY MORPH W/CTA COR W/SCORE W/CA W/CM &/OR WO/CM   ECHOCARDIOGRAM COMPLETE   Itamar Sleep Study   Meds ordered this encounter  Medications   metoprolol tartrate (LOPRESSOR) 100 MG tablet    Sig: Take 1 tablet (152m) TWO hours prior to CT scan    Dispense:  1 tablet    Refill:  0    Patient Instructions  Medication Instructions:  Your physician recommends that you continue on your current medications as directed. Please refer to the Current Medication list given to you today.  *If you need a refill on your cardiac medications before your next appointment, please call your pharmacy*  Testing/Procedures: Coronary CTA-see instructions below  Your physician has requested that you have an echocardiogram. Echocardiography is a painless test that uses sound waves to create images of your heart. It provides your doctor with information about the size and shape of your heart and how well your heart's chambers and valves are working. This procedure takes approximately one hour. There are no restrictions for this procedure. Please do NOT wear cologne, perfume, aftershave, or lotions (deodorant is allowed). Please arrive 15 minutes prior to your appointment time.   WatchPAT?  Is a FDA cleared portable home sleep study test that uses a watch and 3 points of contact to monitor 7 different channels, including your heart rate, oxygen saturations, body position, snoring, and chest motion.  The study is easy to use from the comfort of your own home and accurately detect sleep apnea.  Before bed, you attach the chest sensor, attached the sleep apnea bracelet to your nondominant hand, and attach the finger probe.  After the study, the raw data is downloaded from the watch and scored for apnea events.   For more information: [email or web address]  Patient Testing Instructions:  Do not put battery into the device until bedtime when you are  ready to begin the test. Please call the support number if you need assistance after following the instructions below: 24 hour support line- [phone] ITAMAR support at [phone](option 2)  Download the IThe First AmericanatchPAT One" app through the google play store or App Store  Be sure to turn on or enable access to bluetooth in settlings on your smartphone/ device  Make sure no other bluetooth devices are on and within the vicinity of your smartphone/ device and WatchPAT watch during testing.  Make sure to leave your smart phone/ device plugged in and charging all night.  When ready for bed:  Follow the instructions step by step in the WatchPAT One App to activate the testing device. For additional instructions, including video instruction, visit the WatchPAT One video on Youtube. You can search for WGearyOne within Youtube (video is 4 minutes and 18 seconds) or enter: https://youtube/watch?v=BCce_vbiwxE Please note: You will be prompted to enter a Pin to connect via bluetooth when starting the test. The PIN will be assigned to you when you receive the test.  The device is disposable, but it recommended that you retain the device until you receive a call letting you know the study has been received and the results have been interpreted.  We will let you know if the study did not transmit to uKoreaproperly after  the test is completed. You do not need to call us to confirm the receipt of the test.  Please complete the test within 48 hours of receiving PIN.   Frequently Asked Questions:  What is Watch [place] one?  A single use fully disposable home sleep apnea testing device and will not need to be returned after completion.  What are the requirements to use WatchPAT one?  The be able to have a successful watchpat one sleep study, you should have your Watch pat one device, your smart phone, watch pat one app, your PIN number and Internet access What type of phone do I need?  You should have a smart  phone that uses Android 5.1 and above or any Iphone with IOS 10 and above How can I download the WatchPAT one app?  Based on your device type search for WatchPAT one app either in google play for android devices or APP store for Iphone's Where will I get my PIN for the study?  Your PIN will be provided by your physician's office. It is used for authentication and if you lose/forget your PIN, please reach out to your providers office.  I do not have Internet at home. Can I do WatchPAT one study?  WatchPAT One needs Internet connection throughout the night to be able to transmit the sleep data. You can use your home/local internet or your cellular's data package. However, it is always recommended to use home/local Internet. It is estimated that between 20MB-30MB will be used with each study.However, the application will be looking for 80MB space in the phone to start the study.  What happens if I lose internet or bluetooth connection?  During the internet disconnection, your phone will not be able to transmit the sleep data. All the data, will be stored in your phone. As soon as the internet connection is back on, the phone will being sending the sleep data. During the bluetooth disconnection, WatchPAT one will not be able to to send the sleep data to your phone. Data will be kept in the [facility] one until two devices have bluetooth connection back on. As soon as the connection is back on, WatchPAT one will send the sleep data to the phone.  How long do I need to wear the WatchPAT one?  After you start the study, you should wear the device at least 6 hours.  How far should I keep my phone from the device?  During the night, your phone should be within 15 feet.  What happens if I leave the room for restroom or other reasons?  Leaving the room for any reason will not cause any problem. As soon as your get back to the room, both devices will reconnect and will continue to send the sleep data. Can I use my  phone during the sleep study?  Yes, you can use your phone as usual during the study. But it is recommended to put your watchpat one on when you are ready to go to bed.  How will I get my study results?  A soon as you completed your study, your sleep data will be sent to the provider. They will then share the results with you when they are ready.   Follow-Up: At [facility], you and your health needs are our priority.  As part of our continuing mission to provide you with exceptional heart care, we have created designated Provider Care Teams.  These Care Teams include your primary Cardiologist (physician) and Advanced  Practice Providers (APPs -  Physician Assistants and Nurse Practitioners) who all work together to provide you with the care you need, when you need it.  We recommend signing up for the patient portal called "MyChart".  Sign up information is provided on this After Visit Summary.  MyChart is used to connect with patients for Virtual Visits (Telemedicine).  Patients are able to view lab/test results, encounter notes, upcoming appointments, etc.  Non-urgent messages can be sent to your provider as well.   To learn more about what you can do with MyChart, go to [email or web address].    Your next appointment:   3 month(s)  The format for your next appointment:   In Person  Provider:   Dr. Gardiner Rhyme Other Instructions   Your cardiac CT will be scheduled at one of the below locations:   [facility] [address] [place], [place] [address] [phone]  If scheduled at [facility], please arrive at the [facility] and Children's Entrance (Entrance C2) of [facility] 30 minutes prior to test start time. You can use the FREE valet parking offered at entrance C (encouraged to control the heart rate for the test)  Proceed to the [facility] Radiology Department (first floor) to check-in and test prep.  All radiology patients and guests should  use entrance C2 at [facility], accessed from [facility], even though the hospital's physical address listed is [address].    If scheduled at [facility] or [facility], please arrive 15 mins early for check-in and test prep.   Please follow these instructions carefully (unless otherwise directed):  Hold all erectile dysfunction medications at least 3 days (72 hrs) prior to test. (Ie viagra, cialis, sildenafil, tadalafil, etc) We will administer nitroglycerin during this exam.   On the Night Before the Test: Be sure to Drink plenty of water. Do not consume any caffeinated/decaffeinated beverages or chocolate 12 hours prior to your test. Do not take any antihistamines 12 hours prior to your test.  On the Day of the Test: Drink plenty of water until 1 hour prior to the test. Do not eat any food 1 hour prior to test. You may take your regular medications prior to the test.  Take metoprolol 100 mg (Lopressor) two hours prior to test. Hold lisinopril/hctz AM or procedure FEMALES- please wear underwire-free bra if available, avoid dresses & tight clothing      After the Test: Drink plenty of water. After receiving IV contrast, you may experience a mild flushed feeling. This is normal. On occasion, you may experience a mild rash up to 24 hours after the test. This is not dangerous. If this occurs, you can take Benadryl 25 mg and increase your fluid intake. If you experience trouble breathing, this can be serious. If it is severe call 911 IMMEDIATELY. If it is mild, please call our office. If you take any of these medications: Glipizide/Metformin, Avandament, Glucavance, please do not take 48 hours after completing test unless otherwise instructed.  We will call to schedule your test 2-4 weeks out understanding that some insurance companies will need an authorization prior to the service being performed.   For  non-scheduling related questions, please contact the cardiac imaging nurse navigator should you have any questions/concerns: Marchia Bond, Cardiac Imaging Nurse Navigator Gordy Clement, Cardiac Imaging Nurse Navigator [facility] Direct Office Dial: [phone]   For scheduling needs, including cancellations and  rescheduling, please call [place], [phone].          Signed, Donato Heinz, MD  08/07/2022 10:18 AM    [place]

## 2022-08-07 ENCOUNTER — Ambulatory Visit: Payer: No Typology Code available for payment source | Attending: Cardiology | Admitting: Cardiology

## 2022-08-07 ENCOUNTER — Telehealth: Payer: Self-pay | Admitting: *Deleted

## 2022-08-07 ENCOUNTER — Encounter: Payer: Self-pay | Admitting: Cardiology

## 2022-08-07 VITALS — BP 112/76 | HR 104 | Ht 67.0 in | Wt 223.2 lb

## 2022-08-07 DIAGNOSIS — R079 Chest pain, unspecified: Secondary | ICD-10-CM

## 2022-08-07 DIAGNOSIS — I10 Essential (primary) hypertension: Secondary | ICD-10-CM | POA: Diagnosis not present

## 2022-08-07 DIAGNOSIS — R0683 Snoring: Secondary | ICD-10-CM | POA: Diagnosis not present

## 2022-08-07 DIAGNOSIS — E785 Hyperlipidemia, unspecified: Secondary | ICD-10-CM

## 2022-08-07 DIAGNOSIS — R0609 Other forms of dyspnea: Secondary | ICD-10-CM

## 2022-08-07 MED ORDER — METOPROLOL TARTRATE 100 MG PO TABS: ORAL_TABLET | ORAL | 0 refills | Status: DC

## 2022-08-07 NOTE — Telephone Encounter (Signed)
Notified Stacy Green ok to activate itamar device.  

## 2022-08-07 NOTE — Patient Instructions (Signed)
Medication Instructions:  Your physician recommends that you continue on your current medications as directed. Please refer to the Current Medication list given to you today.  *If you need a refill on your cardiac medications before your next appointment, please call your pharmacy*  Testing/Procedures: Coronary CTA-see instructions below  Your physician has requested that you have an echocardiogram. Echocardiography is a painless test that uses sound waves to create images of your heart. It provides your doctor with information about the size and shape of your heart and how well your heart's chambers and valves are working. This procedure takes approximately one hour. There are no restrictions for this procedure. Please do NOT wear cologne, perfume, aftershave, or lotions (deodorant is allowed). Please arrive 15 minutes prior to your appointment time.   WatchPAT?  Is a FDA cleared portable home sleep study test that uses a watch and 3 points of contact to monitor 7 different channels, including your heart rate, oxygen saturations, body position, snoring, and chest motion.  The study is easy to use from the comfort of your own home and accurately detect sleep apnea.  Before bed, you attach the chest sensor, attached the sleep apnea bracelet to your nondominant hand, and attach the finger probe.  After the study, the raw data is downloaded from the watch and scored for apnea events.   For more information: [email or web address]  Patient Testing Instructions:  Do not put battery into the device until bedtime when you are ready to begin the test. Please call the support number if you need assistance after following the instructions below: 24 hour support line- [phone] or ITAMAR support at [phone] (option 2)  Download the [employer] AmericanWatchPAT One" app through the google play store or App Store  Be sure to turn on or enable access to bluetooth in settlings on your smartphone/  device  Make sure no other bluetooth devices are on and within the vicinity of your smartphone/ device and WatchPAT watch during testing.  Make sure to leave your smart phone/ device plugged in and charging all night.  When ready for bed:  Follow the instructions step by step in the WatchPAT One App to activate the testing device. For additional instructions, including video instruction, visit the WatchPAT One video on Youtube. You can search for [place] One within Youtube (video is 4 minutes and 18 seconds) or enter: https://youtube/watch?v=BCce_vbiwxE Please note: You will be prompted to enter a Pin to connect via bluetooth when starting the test. The PIN will be assigned to you when you receive the test.  The device is disposable, but it recommended that you retain the device until you receive a call letting you know the study has been received and the results have been interpreted.  We will let you know if the study did not transmit to [place] properly after the test is completed. You do not need to call us to confirm the receipt of the test.  Please complete the test within 48 hours of receiving PIN.   Frequently Asked Questions:  What is Watch [place] one?  A single use fully disposable home sleep apnea testing device and will not need to be returned after completion.  What are the requirements to use WatchPAT one?  The be able to have a successful watchpat one sleep study, you should have your Watch pat one device, your smart phone, watch pat one app, your PIN number and Internet access What type of phone do I need?  You should have a smart  phone that uses Android 5.1 and above or any Iphone with IOS 10 and above How can I download the WatchPAT one app?  Based on your device type search for WatchPAT one app either in google play for android devices or APP store for Iphone's Where will I get my PIN for the study?  Your PIN will be provided by your physician's office. It is used for authentication  and if you lose/forget your PIN, please reach out to your providers office.  I do not have Internet at home. Can I do WatchPAT one study?  WatchPAT One needs Internet connection throughout the night to be able to transmit the sleep data. You can use your home/local internet or your cellular's data package. However, it is always recommended to use home/local Internet. It is estimated that between 20MB-30MB will be used with each study.However, the application will be looking for 80MB space in the phone to start the study.  What happens if I lose internet or bluetooth connection?  During the internet disconnection, your phone will not be able to transmit the sleep data. All the data, will be stored in your phone. As soon as the internet connection is back on, the phone will being sending the sleep data. During the bluetooth disconnection, WatchPAT one will not be able to to send the sleep data to your phone. Data will be kept in the [facility] one until two devices have bluetooth connection back on. As soon as the connection is back on, WatchPAT one will send the sleep data to the phone.  How long do I need to wear the WatchPAT one?  After you start the study, you should wear the device at least 6 hours.  How far should I keep my phone from the device?  During the night, your phone should be within 15 feet.  What happens if I leave the room for restroom or other reasons?  Leaving the room for any reason will not cause any problem. As soon as your get back to the room, both devices will reconnect and will continue to send the sleep data. Can I use my phone during the sleep study?  Yes, you can use your phone as usual during the study. But it is recommended to put your watchpat one on when you are ready to go to bed.  How will I get my study results?  A soon as you completed your study, your sleep data will be sent to the provider. They will then share the results with you when they are ready.    Follow-Up: At [facility], you and your health needs are our priority.  As part of our continuing mission to provide you with exceptional heart care, we have created designated Provider Care Teams.  These Care Teams include your primary Cardiologist (physician) and Advanced Practice Providers (APPs -  Physician Assistants and Nurse Practitioners) who all work together to provide you with the care you need, when you need it.  We recommend signing up for the patient portal called "MyChart".  Sign up information is provided on this After Visit Summary.  MyChart is used to connect with patients for Virtual Visits (Telemedicine).  Patients are able to view lab/test results, encounter notes, upcoming appointments, etc.  Non-urgent messages can be sent to your provider as well.   To learn more about what you can do with MyChart, go to [email or web address].    Your next appointment:   3 month(s)  The format for your next  appointment:   In Person  Provider:   Dr. Gardiner Rhyme Other Instructions   Your cardiac CT will be scheduled at one of the below locations:   [facility] [address] [place], [place] [address] [phone]  If scheduled at [facility], please arrive at the [facility] and Children's Entrance (Entrance C2) of [facility] 30 minutes prior to test start time. You can use the FREE valet parking offered at entrance C (encouraged to control the heart rate for the test)  Proceed to the [facility] Radiology Department (first floor) to check-in and test prep.  All radiology patients and guests should use entrance C2 at [facility], accessed from [facility], even though the hospital's physical address listed is [address].    If scheduled at [facility] or [facility], please arrive 15 mins early for check-in and test prep.   Please follow these instructions  carefully (unless otherwise directed):  Hold all erectile dysfunction medications at least 3 days (72 hrs) prior to test. (Ie viagra, cialis, sildenafil, tadalafil, etc) We will administer nitroglycerin during this exam.   On the Night Before the Test: Be sure to Drink plenty of water. Do not consume any caffeinated/decaffeinated beverages or chocolate 12 hours prior to your test. Do not take any antihistamines 12 hours prior to your test.  On the Day of the Test: Drink plenty of water until 1 hour prior to the test. Do not eat any food 1 hour prior to test. You may take your regular medications prior to the test.  Take metoprolol 100 mg (Lopressor) two hours prior to test. Hold lisinopril/hctz AM or procedure FEMALES- please wear underwire-free bra if available, avoid dresses & tight clothing      After the Test: Drink plenty of water. After receiving IV contrast, you may experience a mild flushed feeling. This is normal. On occasion, you may experience a mild rash up to 24 hours after the test. This is not dangerous. If this occurs, you can take Benadryl 25 mg and increase your fluid intake. If you experience trouble breathing, this can be serious. If it is severe call 911 IMMEDIATELY. If it is mild, please call our office. If you take any of these medications: Glipizide/Metformin, Avandament, Glucavance, please do not take 48 hours after completing test unless otherwise instructed.  We will call to schedule your test 2-4 weeks out understanding that some insurance companies will need an authorization prior to the service being performed.   For non-scheduling related questions, please contact the cardiac imaging nurse navigator should you have any questions/concerns: Marchia Bond, Cardiac Imaging Nurse Navigator Gordy Clement, Cardiac Imaging Nurse Navigator [facility] Direct Office Dial: [phone]   For scheduling needs, including cancellations and  rescheduling, please call [place], [phone].

## 2022-08-08 ENCOUNTER — Encounter: Payer: Self-pay | Admitting: Nurse Practitioner

## 2022-08-10 ENCOUNTER — Telehealth: Payer: Self-pay

## 2022-08-10 NOTE — Telephone Encounter (Signed)
I called the patient to speak with her about picking up a itamar device that was ordered at her last visit. The patient states that she will pick up the device today 08/10/2022 at 3 pm.

## 2022-08-11 ENCOUNTER — Encounter: Payer: Self-pay | Admitting: Cardiology

## 2022-08-12 ENCOUNTER — Other Ambulatory Visit: Payer: Self-pay | Admitting: Nurse Practitioner

## 2022-08-12 DIAGNOSIS — G4709 Other insomnia: Secondary | ICD-10-CM

## 2022-08-12 DIAGNOSIS — F32A Depression, unspecified: Secondary | ICD-10-CM

## 2022-08-12 DIAGNOSIS — F419 Anxiety disorder, unspecified: Secondary | ICD-10-CM

## 2022-08-14 ENCOUNTER — Telehealth: Payer: Self-pay

## 2022-08-14 NOTE — Telephone Encounter (Signed)
Called and made the patient aware that SHE may proceed with the Itamar Home Sleep Study. PIN # provided to the patient. Patient made aware that SHE will be contacted after the test has been read with the results and any recommendations. Patient verbalized understanding and thanked me for the call.   

## 2022-08-15 ENCOUNTER — Encounter: Payer: Self-pay | Admitting: Physical Medicine & Rehabilitation

## 2022-08-16 ENCOUNTER — Telehealth (HOSPITAL_COMMUNITY): Payer: Self-pay | Admitting: Emergency Medicine

## 2022-08-16 NOTE — Telephone Encounter (Signed)
Reaching out to patient to offer assistance regarding upcoming cardiac imaging study; pt verbalizes understanding of appt date/time, parking situation and where to check in, pre-test NPO status and medications ordered, and verified current allergies; name and call back number provided for further questions should they arise Virginia Bond RN Navigator Cardiac Imaging [facility] [phone] office [phone] cell  Arrival 800 Holding adderall, zestoretic, allergy meds '100mg'$  metoprolol tartrate 2 hr prior  Denies iv issues

## 2022-08-17 ENCOUNTER — Telehealth (HOSPITAL_COMMUNITY): Payer: Self-pay | Admitting: Emergency Medicine

## 2022-08-17 ENCOUNTER — Ambulatory Visit (HOSPITAL_COMMUNITY): Admission: RE | Admit: 2022-08-17 | Payer: No Typology Code available for payment source | Source: Ambulatory Visit

## 2022-08-17 DIAGNOSIS — R079 Chest pain, unspecified: Secondary | ICD-10-CM

## 2022-08-17 MED ORDER — METOPROLOL TARTRATE 100 MG PO TABS: ORAL_TABLET | ORAL | 0 refills | Status: DC

## 2022-08-17 NOTE — Telephone Encounter (Signed)
Calling patient about appt today and that we do not have an ins auth for todays CCTA. I explained that it would mean she would need to pay for the test out of pocket and suggested postponing until the auth could be obtained. Pt agreed. Moved appt to 08/25/22 and will resend metop to pharm on file.   Pt appreciated the phone call.  Marchia Bond RN Navigator Cardiac Imaging [facility] [phone] Office  [phone] Cell

## 2022-08-20 NOTE — Progress Notes (Signed)
This encounter was created in error - please disregard.

## 2022-08-20 NOTE — Procedures (Signed)
Erroneous encounter

## 2022-08-22 ENCOUNTER — Other Ambulatory Visit (HOSPITAL_COMMUNITY): Payer: Self-pay | Admitting: Cardiology

## 2022-08-22 DIAGNOSIS — R079 Chest pain, unspecified: Secondary | ICD-10-CM

## 2022-08-24 ENCOUNTER — Telehealth (HOSPITAL_COMMUNITY): Payer: Self-pay | Admitting: *Deleted

## 2022-08-24 NOTE — Telephone Encounter (Signed)
Reaching out to patient to offer assistance regarding upcoming cardiac imaging study; pt verbalizes understanding of appt date/time, parking situation and where to check in, pre-test NPO status and medications ordered, and verified current allergies; name and call back number provided for further questions should they arise  Arjan Strohm RN Navigator Cardiac Imaging [facility] [phone] office [phone] cell  Patient to take 100mg metoprolol tartrate two hours prior to her cardiac CT scan. She is aware to arrive at 9am. 

## 2022-08-25 ENCOUNTER — Ambulatory Visit (INDEPENDENT_AMBULATORY_CARE_PROVIDER_SITE_OTHER): Payer: No Typology Code available for payment source | Admitting: Orthopedic Surgery

## 2022-08-25 ENCOUNTER — Ambulatory Visit (HOSPITAL_COMMUNITY)
Admission: RE | Admit: 2022-08-25 | Discharge: 2022-08-25 | Disposition: A | Discharge status: Home / Self Care | Payer: No Typology Code available for payment source | Source: Ambulatory Visit | Attending: Cardiology | Admitting: Cardiology

## 2022-08-25 DIAGNOSIS — M67912 Unspecified disorder of synovium and tendon, left shoulder: Secondary | ICD-10-CM

## 2022-08-25 DIAGNOSIS — R079 Chest pain, unspecified: Secondary | ICD-10-CM | POA: Insufficient documentation

## 2022-08-25 MED ORDER — METOPROLOL TARTRATE 5 MG/5ML IV SOLN
INTRAVENOUS | Status: AC
  Administered 2022-08-25: 10 mg via INTRAVENOUS
  Filled 2022-08-25: qty 10

## 2022-08-25 MED ORDER — METOPROLOL TARTRATE 5 MG/5ML IV SOLN: 10.0000 mg | Freq: Once | INTRAVENOUS | Status: AC

## 2022-08-25 MED ORDER — NITROGLYCERIN 0.4 MG SL SUBL
SUBLINGUAL_TABLET | SUBLINGUAL | Status: AC
  Administered 2022-08-25: 0.8 mg via SUBLINGUAL
  Filled 2022-08-25: qty 2

## 2022-08-25 MED ORDER — NITROGLYCERIN 0.4 MG SL SUBL: 0.8000 mg | SUBLINGUAL_TABLET | Freq: Once | SUBLINGUAL | Status: AC

## 2022-08-25 MED ORDER — DILTIAZEM HCL 25 MG/5ML IV SOLN
INTRAVENOUS | Status: AC
  Filled 2022-08-25: qty 10

## 2022-08-25 MED ORDER — DILTIAZEM HCL 25 MG/5ML IV SOLN
10.0000 mg | Freq: Once | INTRAVENOUS | Status: AC
  Administered 2022-08-25: 10 mg via INTRAVENOUS

## 2022-08-25 MED ORDER — IOHEXOL 350 MG/ML SOLN
100.0000 mL | Freq: Once | INTRAVENOUS | Status: AC | PRN
  Administered 2022-08-25: 100 mL via INTRAVENOUS

## 2022-08-25 NOTE — Progress Notes (Signed)
Orthopedic Spine Surgery Office Note   Assessment: Patient is a 46 y.o. female with left trapezius and shoulder pain. Worse with overheard activity and has pain at night. Had pain with jobe test and had tenderness to palpation over the bicipital groove. Her MRI cervical spine was not consistent with myelopathy and did not show any significant foraminal stenosis to explain the pain. My suspicion is that the etiology of the pain is her rotator cuff and biceps tendon     Plan: -She has no significant improvement in her shoulder pain, so recommended no further intervention at this time.  She should continue with the home exercises as recommended by physical therapy since they did help.  I explained that she should do these exercises consistently 3-4 times per week going forward.  If she develops a flare of pain, I recommended she use NSAIDs. -Patient has tried activity modification, tylenol, PT -If the pain returns and is consistent, will consider getting an MRI of the left shoulder for diagnosis -Patient should return to office on an as-needed basis     Patient expressed understanding of the plan and all questions were answered to the patient's satisfaction.    __________________________________________________________________________   History: Patient is a 46 y.o. female who has been previously seen in the office for symptoms consistent with cervical radiculopathy. Since the last visit, her symptoms have improved significantly.  She is now having mild pain in the shoulder.  Pain is felt mostly with overhead activities.  It is felt over the lateral and anterior aspect of the shoulder.  She feels that she did get some help with the physical therapy and home exercises.  Denies paresthesias or numbness.  Hand strength has improved.   Previous treatments: PT, activity modification, tylenol   COPY OF FIRST NOTE Patient is a 46 y.o. female who presents today for cervical spine.  Patient reports a 1 year  history of neck pain that radiates into her left shoulder.  Does not go past the deltoid.  There is no trauma or injury that brought on the pain.  She feels this pain is gotten progressively worse with time.  She notes that rest makes it better.  She also reported feeling off balance and having unsteadiness with gait.  She states that she is unable to go up the stairs without using a handrail due to her unsteadiness.  She also has clumsiness in her hands.  States that she drops objects involuntarily at times.  Reports decreased sensation in her bilateral hands that is mild.     Weakness: Yes, reports feeling weaker in her hands Difficulty with fine motor skills (e.g., buttoning shirts, handwriting): Yes, has trouble buttoning shirts and will at times drop objects Symptoms of imbalance: Yes Paresthesias and numbness: Yes reports decreased sensation in her bilateral hands and feet.  Does report a history of neuropathy Bowel or bladder incontinence: Denies Saddle anesthesia: Denies END OF COPY   Physical Exam:   General: no acute distress, appears stated age Neurologic: alert, answering questions appropriately, following commands Respiratory: unlabored breathing on room air, symmetric chest rise Psychiatric: appropriate affect, normal cadence to speech     MSK:   -Left shoulder exam: Tender to palpation over the bicipital groove, otherwise no tenderness palpation over the remainder of the shoulder, negative drop arm sign, some pain with Jobe testing but no weakness, no weakness with external rotation with arm at side, negative belly press, negative lift off, negative Hawkins, full passive range of motion of  the shoulder   -Sensory exam                           Sensation intact to light touch in median/radial/ulnar/axillary nerve distributions on the left side   Imaging: XR of the cervical spine from 06/29/2022 was previously independently reviewed and interpreted, showing anterior  osteophyte formation at C6/7. No evidence of instability on flexion/extension. No fracture or dislocation.     MRI of the cervical spine from 07/16/2022 was previously independently reviewed and interpreted, showing minimal foraminal stenosis at C7/T1. No other significant stenosis.    Patient name: Virginia Douglas Patient MRN: [id number] Date of visit: 08/25/22

## 2022-09-01 ENCOUNTER — Ambulatory Visit (HOSPITAL_COMMUNITY): Payer: No Typology Code available for payment source | Attending: Internal Medicine

## 2022-09-01 ENCOUNTER — Telehealth: Payer: Self-pay

## 2022-09-01 ENCOUNTER — Other Ambulatory Visit (HOSPITAL_COMMUNITY): Payer: Self-pay

## 2022-09-01 DIAGNOSIS — R079 Chest pain, unspecified: Secondary | ICD-10-CM | POA: Insufficient documentation

## 2022-09-01 LAB — ECHOCARDIOGRAM COMPLETE
Area-P 1/2: 5.09 cm2
Calc EF: 30.4 %
P 1/2 time: 186 msec
S' Lateral: 4 cm
Single Plane A2C EF: 13.1 %
Single Plane A4C EF: 34.4 %

## 2022-09-01 NOTE — Telephone Encounter (Signed)
Pharmacy Patient Advocate Encounter   Received notification from CoverMyMeds that prior authorization for Aimovig '140MG'$ /ML auto-injectors is required/requested.    PA submitted on 09/01/22 to (ins) [employer] via CoverMyMeds Key [id number] Status is pending

## 2022-09-05 ENCOUNTER — Telehealth: Payer: Self-pay

## 2022-09-05 NOTE — Telephone Encounter (Signed)
I tried calling the patient unable to LVM for the patient. Will try to reach the patient again.

## 2022-09-07 ENCOUNTER — Other Ambulatory Visit: Payer: Self-pay | Admitting: Nurse Practitioner

## 2022-09-07 DIAGNOSIS — E669 Obesity, unspecified: Secondary | ICD-10-CM

## 2022-09-07 MED ORDER — PHENTERMINE HCL 37.5 MG PO TABS: 37.5000 mg | ORAL_TABLET | Freq: Every day | ORAL | 1 refills | Status: DC

## 2022-09-07 NOTE — Progress Notes (Signed)
Cardiology Office Note:    Date:  09/08/2022   ID:  [name], DOB [birth year], MRN [id number]  PCP:  [name], FNP  Cardiologist:  Donato Heinz, MD  Electrophysiologist:  None   Referring MD: [name], FNP   Chief Complaint  Patient presents with   Congestive Heart Failure    History of Present Illness:    Luciel Brickman is a 47 y.o. female with a hx of recently diagnosed combined systolic and diastolic heart failure, GERD, hypertension, diabetes who presents for follow-up.  She was referred by [name], NP for evaluation of abnormal EKG, initially seen on 08/07/2022.  She reports having chest pain which occurs about once per week.  Describes as sharp pain in center of her chest, lasts for 30 seconds or so.  She does not exercise but reports she is short of breath with minimal exertion, such as walking up stairs.  She reports having lightheadedness but denies any recent syncope.  Does report lower extremity swelling.  Reports palpitations where feels like heart is racing, occurs about once every 2 weeks.  She smoked 1 pack/day x 16 years, quit age 33.  No drug use.  Alcohol on special occasions.  Family history includes father had CVA age 75.  Echocardiogram 09/01/2022 showed EF 30 to 35%, normal RV function, no significant valvular disease.  Coronary CTA on 08/25/2022 showed normal coronary arteries.  Since last clinic visit, she reports she is doing okay.  Continues to have intermittent chest pain and dyspnea.  Continues to have palpitations about every 2 weeks, feels like heart is racing.  Does report some lower extremity edema.   Wt Readings from Last 3 Encounters:  09/08/22 227 lb (103 kg)  08/07/22 223 lb 3.2 oz (101.2 kg)  07/25/22 216 lb (98 kg)     Past Medical History:  Diagnosis Date   ADHD (attention deficit hyperactivity disorder)    Anemia    iron deficiency   as a teenager   Anxiety    Arthritis    Diabetes ([place])     Edema    GERD (gastroesophageal reflux disease)    Hypertension    patient denies- patient stated that the PCP said it was for heart prevention.   Migraine headache     Past Surgical History:  Procedure Laterality Date   ABDOMINAL HYSTERECTOMY  2012   CHOLECYSTECTOMY     COLONOSCOPY  12/28/2020   polyps   ESOPHAGEAL DILATION     gamma knife     04/2022   IR ANGIO INTRA EXTRACRAN SEL COM CAROTID INNOMINATE BILAT MOD SED  05/31/2021   IR ANGIO INTRA EXTRACRAN SEL COM CAROTID INNOMINATE BILAT MOD SED  06/27/2021   IR ANGIO INTRA EXTRACRAN SEL COM CAROTID INNOMINATE UNI R MOD SED  11/21/2021   IR ANGIO VERTEBRAL SEL VERTEBRAL UNI R MOD SED  05/31/2021   IR CT HEAD LTD  06/27/2021   IR CT HEAD LTD  11/21/2021   IR INTRA CRAN STENT  06/27/2021   IR INTRA CRAN STENT  11/21/2021   IR RADIOLOGIST EVAL & MGMT  06/03/2021   IR RADIOLOGIST EVAL & MGMT  07/13/2021   IR RADIOLOGIST EVAL & MGMT  08/27/2021   IR RADIOLOGIST EVAL & MGMT  10/30/2021   IR RADIOLOGIST EVAL & MGMT  12/06/2021   IR US GUIDE VASC ACCESS RIGHT  05/31/2021   IR US GUIDE VASC ACCESS RIGHT  06/27/2021   IR US GUIDE VASC ACCESS RIGHT  11/21/2021   OVARIAN CYST REMOVAL     RADIOLOGY WITH ANESTHESIA N/A 06/27/2021   Procedure: STENTING;  Surgeon: Luanne Bras, MD;  Location: [facility];  Service: Radiology;  Laterality: N/A;   RADIOLOGY WITH ANESTHESIA N/A 11/21/2021   Procedure: IR WITH ANESTHESIA STENTING;  Surgeon: Luanne Bras, MD;  Location: [facility];  Service: Radiology;  Laterality: N/A;    Current Medications: Current Meds  Medication Sig   acetaZOLAMIDE (DIAMOX) 250 MG tablet TAKE 1 TABLET BY MOUTH TWICE A DAY   ADDERALL XR 30 MG 24 hr capsule Take 30 mg by mouth daily.   AIMOVIG 140 MG/ML SOAJ INJECT 140 MG INTO THE SKIN EVERY 30 DAYS   amoxicillin (AMOXIL) 875 MG tablet Take 1 tablet (875 mg total) by mouth 2 (two) times daily.   aspirin EC 81 MG tablet Take 81 mg by mouth daily. Swallow whole.    atorvastatin (LIPITOR) 10 MG tablet TAKE 1 TABLET BY MOUTH EVERY DAY   Blood Glucose Monitoring Suppl (ONETOUCH VERIO IQ SYSTEM) w/Device KIT by Does not apply route. Use to check blood sugars twice   Boric Acid Vaginal (AZO BORIC ACID) 600 MG SUPP Place 1 suppository vaginally at bedtime.   buPROPion (WELLBUTRIN XL) 150 MG 24 hr tablet TAKE 1 TABLET BY MOUTH EVERY DAY IN THE MORNING   carvedilol (COREG) 3.125 MG tablet Take 1 tablet (3.125 mg total) by mouth 2 (two) times daily.   cetirizine (ZYRTEC) 10 MG tablet Take 10 mg by mouth daily as needed for allergies.   chlorhexidine (PERIDEX) 0.12 % solution 5 mLs by Mouth Rinse route daily.   clonazePAM (KLONOPIN) 0.5 MG tablet Take 1 tablet by mouth three times a day as needed   clopidogrel (PLAVIX) 75 MG tablet Take 1 tablet (75 mg total) by mouth daily.   Dulaglutide (TRULICITY) 1.5 PN/3.6RW SOPN INJECT 1.5 MG INTO THE SKIN ONCE A WEEK.   DULoxetine (CYMBALTA) 60 MG capsule TAKE 1 CAPSULE BY MOUTH EVERY DAY   famotidine (PEPCID) 20 MG tablet Take 20 mg by mouth daily.   glucose blood test strip 1 each by Other route as needed for other. Insert 1 by subcutaneous route 2 times every day check blood sugar before breakfast and dinner   hydrOXYzine (VISTARIL) 50 MG capsule TAKE 1-2 CAPSULES BY MOUTH AS NEEDED FOR SLEEP AT BEDTIME   Insulin Pen Needle (PEN NEEDLES) 32G X 4 MM MISC 1 each by Does not apply route once a week.   Lasmiditan Succinate (REYVOW) 100 MG TABS Take 1 tablet by mouth daily as needed.   linaclotide (LINZESS) 72 MCG capsule Take 1 capsule (72 mcg total) by mouth daily before breakfast.   losartan (COZAAR) 50 MG tablet Take 1 tablet (50 mg total) by mouth daily.   omeprazole (PRILOSEC) 40 MG capsule Take 40 mg by mouth daily.   ondansetron (ZOFRAN-ODT) 4 MG disintegrating tablet 4 mg daily as needed for nausea or vomiting.   Polyethyl Glycol-Propyl Glycol (SYSTANE) 0.4-0.3 % SOLN Place 1 drop into both eyes daily.   saccharomyces  boulardii (FLORASTOR) 250 MG capsule Take 250 mg by mouth daily. gummy   tiZANidine (ZANAFLEX) 4 MG tablet Take 4 mg by mouth daily as needed for muscle spasms.   topiramate (TOPAMAX) 50 MG tablet TAKE 2 AND 1/2 TABLETS (125 MG TOTAL) BY MOUTH AT BEDTIME.   Vitamin D, Ergocalciferol, (DRISDOL) 1.25 MG (50000 UNIT) CAPS capsule TAKE 1 CAPSULE BY MOUTH EVERY 7 DAYS **NOT COVERED**   [DISCONTINUED] lisinopril-hydrochlorothiazide (  ZESTORETIC) 10-12.5 MG tablet TAKE 1 TABLET BY MOUTH EVERY DAY   [DISCONTINUED] metoprolol tartrate (LOPRESSOR) 100 MG tablet Take 1 tablet (1102m) TWO hours prior to CT scan   [DISCONTINUED] Phentermine-Topiramate 11.25-69 MG CP24 Take 1 tablet by mouth daily.     Allergies:   Patient has no known allergies.   Social History   Socioeconomic History   Marital status: Legally Separated    Spouse name: JJeneen Rinks  Number of children: 2   Years of education: 16   Highest education level: Some college, no degree  Occupational History   Occupation: AFacilities manager [place] Tobacco Use   Smoking status: Former    Packs/day: 0.50    Years: 25.00    Total pack years: 12.50    Types: Cigarettes    Start date: 03/24/2019    Passive exposure: Current   Smokeless tobacco: Never   Tobacco comments:    06/24/21- quit over a year ago  Vaping Use   Vaping Use: Never used  Substance and Sexual Activity   Alcohol use: No    Alcohol/week: 0.0 standard drinks of alcohol   Drug use: No   Sexual activity: Yes    Birth control/protection: Surgical  Other Topics Concern   Not on file  Social History Narrative   In relationship, iAgricultural consultantin mPsychologist, 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 She lives with her 2 children in a one story house. She drinks one large cup of coffee a day and an occasional tea or soda. She walks  daily.      One story home      Social Determinants of Health   Financial Resource Strain: Not on file  Food Insecurity: Not on file  Transportation Needs: Not on file  Physical Activity: Not on file  Stress: Not on file  Social Connections: Not on file     Family History: The patient's family history includes Breast cancer in her maternal aunt and paternal grandmother; Cancer in her maternal aunt and maternal grandmother; Diabetes in her father, maternal grandmother, mother, and paternal grandmother; Healthy in her son and son; Hypertension in her brother, father, and mother; Kidney failure in her father; Pancreatic cancer in her maternal grandmother; Stroke in her paternal grandfather.  ROS:   Please see the history of present illness.     All other systems reviewed and are negative.  EKGs/Labs/Other Studies Reviewed:    The following studies were reviewed today:   EKG:   07/25/2022: Normal sinus rhythm, rate 89, LVH  Recent Labs: 07/25/2022: ALT 22; BUN 7; Creatinine, Ser 0.71; Hemoglobin 14.0; Platelets 320; Potassium 4.7; Sodium 139  Recent Lipid Panel    Component Value Date/Time   CHOL 166 07/25/2022 1001   TRIG 95 07/25/2022 1001   HDL 49 07/25/2022 1001   CHOLHDL 3.4 07/25/2022 1001   LDLCALC 99 07/25/2022 1001    Physical Exam:    VS:  BP 116/72 (BP Location: Left Arm, Patient Position: Sitting, Cuff Size: Large)   Pulse (!) 105   Ht _0  (1.702 m)   Wt 227 lb (103 kg)   SpO2 97%   BMI 35.55 kg/m     Wt Readings from Last 3 Encounters:  09/08/22 227 lb (103 kg)  08/07/22 223 lb 3.2 oz (101.2 kg)  07/25/22 216 lb (98 kg)     GEN:  Well nourished, well developed in no acute distress HEENT: Normal NECK: No JVD; No carotid bruits LYMPHATICS: No lymphadenopathy CARDIAC: RRR, no murmurs, rubs, gallops RESPIRATORY:  Clear to auscultation without rales, wheezing or rhonchi  ABDOMEN: Soft, non-tender, non-distended MUSCULOSKELETAL:  No edema; No  deformity  SKIN: Warm and dry NEUROLOGIC:  Alert and oriented x 3 PSYCHIATRIC:  Normal affect   ASSESSMENT:    1. DOE (dyspnea on exertion)   2. Chronic combined systolic (congestive) and diastolic (congestive) heart failure (HCC)   3. Palpitations   4. Essential hypertension     PLAN:    Chronic combined heart failure: Echocardiogram 09/01/2022 showed EF 30 to 35%, normal RV function, no significant valvular disease.  Coronary CTA on 08/25/2022 showed normal coronary arteries.  Appears euvolemic. -Stop lisinopril-HCTZ, will start losartan 50 mg daily with plan to convert to [facility] at follow-up appointment.  Check BMET in 1 to 2 weeks -Start Coreg 3.125 mg twice daily -Plan to add spironolactone and SGLT2 inhibitor at follow-up  -Recommend follow-up in pharmacy heart failure clinic in 2 weeks to titrate GDMT -Cardiac MRI to workup nonischemic cardiomyopathy  Palpitations: Description concerning for arrhythmia, will check Zio patch x 2 weeks  Hypertension: On lisinopril/HCTZ 10-12.5 mg daily and acetazolamide 250 mg twice daily.  Appears controlled.  Stop lisinopril-HCTZ with plans to start GDMT as above.  Would continue acetazolamide which she takes for her idiopathic intracranial hypertension.  Hyperlipidemia: On atorvastatin 10 mg daily.  LDL 99 07/25/22  T2DM: A1c 6.7%.  On metformin  Left sigmoid sinus stenosis: Status post stenting 06/2021.  She is on aspirin and Plavix.  Left superior cerebellar AVM: Underwent gamma knife radiosurgery at [facility]  Idiopathic intracranial hypertension: On Diamox 250 mg twice daily  Snoring/daytime somnolence: Recommend an [place] sleep study.    RTC in 2 months   Medication Adjustments/Labs and Tests Ordered: Current medicines are reviewed at length with the patient today.  Concerns regarding medicines are outlined above.  Orders Placed This Encounter  Procedures   MR CARDIAC MORPHOLOGY W WO CONTRAST   CBC   Basic metabolic panel    AMB Referral to [facility]   LONG TERM MONITOR (3-14 DAYS)   Meds ordered this encounter  Medications   losartan (COZAAR) 50 MG tablet    Sig: Take 1 tablet (50 mg total) by mouth daily.    Dispense:  90 tablet    Refill:  3   carvedilol (COREG) 3.125 MG tablet    Sig: Take 1 tablet (3.125 mg total) by mouth 2 (two) times daily.    Dispense:  180 tablet    Refill:  3    Patient Instructions  Medication Instructions:    STOP LISINOPRIL/HCTZ  START: CARVEDILOL (COREG) 3.162m TWICE DAILY   START: LOSARTAN 564mONCE DAILY    *If you need a refill on your cardiac medications before your next appointment, please call your pharmacy*  Lab Work:  Please return for Blood Work in 2 WEEKS ([facility] appointment needed, lab here at the office is open Monday-Friday from 8AM to 4PM and closed daily for lunch from 12:45-1:45.   If you have labs (blood work) drawn today and your tests are completely normal, you will receive your results only by: MyVisaliaif you have MyChart) OR A paper copy in the mail If you have any lab test that is abnormal or we need to change your  treatment, we will call you to review the results.  Testing/Procedures:   You are scheduled for Cardiac MRI on ______________. Please arrive for your appointment at ______________ ( arrive 30-45 minutes prior to test start time). ?  [facility] [address] [place], [place] [address] [phone] Please take advantage of the free valet parking available at the MAIN entrance (A entrance).  Proceed to the [facility] Radiology Department (First Floor) for check-in.   Magnetic resonance imaging (MRI) is a painless test that produces images of the inside of the body without using Xrays.  During an MRI, strong magnets and radio waves work together in a Research officer, political party to form detailed images.   MRI images may provide more details about a medical condition than X-rays, CT scans,  and ultrasounds can provide.  You may be given earphones to listen for instructions.  You may eat a light breakfast and take medications as ordered with the exception of HCTZ (fluid pill, other). Please avoid stimulants for 12 hr prior to test. (Ie. Caffeine, nicotine, chocolate, or antihistamine medications)  If a contrast material will be used, an IV will be inserted into one of your veins. Contrast material will be injected into your IV. It will leave your body through your urine within a day. You may be told to drink plenty of fluids to help flush the contrast material out of your system.  You will be asked to remove all metal, including: Watch, jewelry, and other metal objects including hearing aids, hair pieces and dentures. Also wearable glucose monitoring systems (ie. Freestyle Libre and Omnipods) (Braces and fillings normally are not a problem.)   TEST WILL TAKE APPROXIMATELY 1 HOUR  PLEASE NOTIFY SCHEDULING AT LEAST 24 HOURS IN ADVANCE IF YOU ARE UNABLE TO KEEP YOUR APPOINTMENT. [phone]  Please call Marchia Bond, cardiac imaging nurse navigator with any questions/concerns. Marchia Bond RN Navigator Cardiac Imaging Gordy Clement RN Navigator Cardiac Imaging [facility] [phone] Office    ZIO XT- Long Term Monitor Instructions   Your physician has requested you wear your ZIO patch monitor__14_____days.   This is a single patch monitor.  [employer] supplies one patch monitor per enrollment.  Additional stickers are not available.   Please do not apply patch if you will be having a Nuclear Stress Test, Echocardiogram, Cardiac CT, MRI, or Chest Xray during the time frame you would be wearing the monitor. The patch cannot be worn during these tests.  You cannot remove and re-apply the ZIO XT patch monitor.   Your ZIO patch monitor will be sent USPS Priority mail from [facility] directly to your home address. The monitor may also be mailed  to a PO BOX if home delivery is not available.   It may take 3-5 days to receive your monitor after you have been enrolled.   Once you have received you monitor, please review enclosed instructions.  Your monitor has already been registered assigning a specific monitor serial # to you.   Applying the monitor   Shave hair from upper left chest.   Hold abrader disc by orange tab.  Rub abrader in 40 strokes over left upper chest as indicated in your monitor instructions.   Clean area with 4 enclosed alcohol pads .  Use all pads to assure are is cleaned thoroughly.  Let dry.   Apply patch as indicated in monitor instructions.  Patch will be place under collarbone on left side of chest with arrow  pointing upward.   Rub patch adhesive wings for 2 minutes.Remove white label marked "1".  Remove white label marked "2".  Rub patch adhesive wings for 2 additional minutes.   While looking in a mirror, press and release button in center of patch.  A small green light will flash 3-4 times .  This will be your only indicator the monitor has been turned on.     Do not shower for the first 24 hours.  You may shower after the first 24 hours.   Press button if you feel a symptom. You will hear a small click.  Record Date, Time and Symptom in the Patient Log Book.   When you are ready to remove patch, follow instructions on last 2 pages of Patient Log Book.  Stick patch monitor onto last page of Patient Log Book.   Place Patient Log Book in [place] box.  Use locking tab on box and tape box closed securely.  The [employer] and [employer] has [employer] on it.  Please place in mailbox as soon as possible.  Your physician should have your test results approximately 7 days after the monitor has been mailed back to [facility].   Call [facility] at [phone] if you have questions regarding your ZIO XT patch monitor.  Call them immediately if you see an orange light blinking on your monitor.    If your monitor falls off in less than 4 days contact our Monitor department at [phone].  If your monitor becomes loose or falls off after 4 days call [employer] at [phone] for suggestions on securing your monitor.   Follow-Up: At [facility], you and your health needs are our priority.  As part of our continuing mission to provide you with exceptional heart care, we have created designated Provider Care Teams.  These Care Teams include your primary Cardiologist (physician) and Advanced Practice Providers (APPs -  Physician Assistants and Nurse Practitioners) who all work together to provide you with the care you need, when you need it.  PLEASE SCHEDULE APPOINTMENT IN 2 WEEKS WITH PHARMACY FOR MEDICATION TITRATION Your next appointment:   2 month(s)  The format for your next appointment:   In Person  Provider:   Donato Heinz, MD            Signed, Donato Heinz, MD  09/08/2022 12:17 PM    [place]

## 2022-09-08 ENCOUNTER — Ambulatory Visit (INDEPENDENT_AMBULATORY_CARE_PROVIDER_SITE_OTHER): Payer: No Typology Code available for payment source

## 2022-09-08 ENCOUNTER — Ambulatory Visit: Payer: No Typology Code available for payment source | Attending: Cardiology | Admitting: Cardiology

## 2022-09-08 ENCOUNTER — Encounter: Payer: Self-pay | Admitting: Cardiology

## 2022-09-08 VITALS — BP 116/72 | HR 105 | Ht 67.0 in | Wt 227.0 lb

## 2022-09-08 DIAGNOSIS — I10 Essential (primary) hypertension: Secondary | ICD-10-CM

## 2022-09-08 DIAGNOSIS — I5042 Chronic combined systolic (congestive) and diastolic (congestive) heart failure: Secondary | ICD-10-CM | POA: Diagnosis not present

## 2022-09-08 DIAGNOSIS — R0609 Other forms of dyspnea: Secondary | ICD-10-CM

## 2022-09-08 DIAGNOSIS — R002 Palpitations: Secondary | ICD-10-CM | POA: Diagnosis not present

## 2022-09-08 MED ORDER — LOSARTAN POTASSIUM 50 MG PO TABS: 50.0000 mg | ORAL_TABLET | Freq: Every day | ORAL | 3 refills | Status: DC

## 2022-09-08 MED ORDER — CARVEDILOL 3.125 MG PO TABS: 3.1250 mg | ORAL_TABLET | Freq: Two times a day (BID) | ORAL | 3 refills | Status: DC

## 2022-09-08 NOTE — Patient Instructions (Signed)
Medication Instructions:    STOP LISINOPRIL/HCTZ  START: CARVEDILOL (COREG) 3.'125mg'$  TWICE DAILY   START: LOSARTAN '50mg'$  ONCE DAILY    *If you need a refill on your cardiac medications before your next appointment, please call your pharmacy*  Lab Work:  Please return for Blood Work in [place] (AT [place]). No appointment needed, lab here at the office is open Monday-Friday from 8AM to 4PM and closed daily for lunch from 12:45-1:45.   If you have labs (blood work) drawn today and your tests are completely normal, you will receive your results only by: [place] (if you have MyChart) OR A paper copy in the mail If you have any lab test that is abnormal or we need to change your treatment, we will call you to review the results.  Testing/Procedures:   You are scheduled for Cardiac MRI on ______________. Please arrive for your appointment at ______________ ( arrive 30-45 minutes prior to test start time). ?  [facility] [address] [place], [place] [address] [phone] Please take advantage of the free valet parking available at the MAIN entrance (A entrance).  Proceed to the [facility] Radiology Department (First Floor) for check-in.   Magnetic resonance imaging (MRI) is a painless test that produces images of the inside of the body without using Xrays.  During an MRI, strong magnets and radio waves work together in a Research officer, political party to form detailed images.   MRI images may provide more details about a medical condition than X-rays, CT scans, and ultrasounds can provide.  You may be given earphones to listen for instructions.  You may eat a light breakfast and take medications as ordered with the exception of HCTZ (fluid pill, other). Please avoid stimulants for 12 hr prior to test. (Ie. Caffeine, nicotine, chocolate, or antihistamine medications)  If a contrast material will be used, an IV will be inserted into one of your veins. Contrast material  will be injected into your IV. It will leave your body through your urine within a day. You may be told to drink plenty of fluids to help flush the contrast material out of your system.  You will be asked to remove all metal, including: Watch, jewelry, and other metal objects including hearing aids, hair pieces and dentures. Also wearable glucose monitoring systems (ie. Freestyle Libre and Omnipods) (Braces and fillings normally are not a problem.)   TEST WILL TAKE APPROXIMATELY 1 HOUR  PLEASE NOTIFY SCHEDULING AT LEAST 24 HOURS IN ADVANCE IF YOU ARE UNABLE TO KEEP YOUR APPOINTMENT. [phone]  Please call Marchia Bond, cardiac imaging nurse navigator with any questions/concerns. Marchia Bond RN Navigator Cardiac Imaging Gordy Clement RN Navigator Cardiac Imaging [facility] [phone] Office    ZIO XT- Long Term Monitor Instructions   Your physician has requested you wear your ZIO patch monitor__14_____days.   This is a single patch monitor.  [employer] supplies one patch monitor per enrollment.  Additional stickers are not available.   Please do not apply patch if you will be having a Nuclear Stress Test, Echocardiogram, Cardiac CT, MRI, or Chest Xray during the time frame you would be wearing the monitor. The patch cannot be worn during these tests.  You cannot remove and re-apply the ZIO XT patch monitor.   Your ZIO patch monitor will be sent USPS Priority mail from [facility] directly to your home address. The monitor may also be mailed to a PO BOX if home delivery is  not available.   It may take 3-5 days to receive your monitor after you have been enrolled.   Once you have received you monitor, please review enclosed instructions.  Your monitor has already been registered assigning a specific monitor serial # to you.   Applying the monitor   Shave hair from upper left chest.   Hold abrader disc by orange tab.  Rub abrader in 40 strokes  over left upper chest as indicated in your monitor instructions.   Clean area with 4 enclosed alcohol pads .  Use all pads to assure are is cleaned thoroughly.  Let dry.   Apply patch as indicated in monitor instructions.  Patch will be place under collarbone on left side of chest with arrow pointing upward.   Rub patch adhesive wings for 2 minutes.Remove white label marked "1".  Remove white label marked "2".  Rub patch adhesive wings for 2 additional minutes.   While looking in a mirror, press and release button in center of patch.  A small green light will flash 3-4 times .  This will be your only indicator the monitor has been turned on.     Do not shower for the first 24 hours.  You may shower after the first 24 hours.   Press button if you feel a symptom. You will hear a small click.  Record Date, Time and Symptom in the Patient Log Book.   When you are ready to remove patch, follow instructions on last 2 pages of Patient Log Book.  Stick patch monitor onto last page of Patient Log Book.   Place Patient Log Book in [place] box.  Use locking tab on box and tape box closed securely.  The [employer] has [employer] on it.  Please place in mailbox as soon as possible.  Your physician should have your test results approximately 7 days after the monitor has been mailed back to [facility].   Call [facility] at [phone] if you have questions regarding your ZIO XT patch monitor.  Call them immediately if you see an orange light blinking on your monitor.   If your monitor falls off in less than 4 days contact our Monitor department at [phone].  If your monitor becomes loose or falls off after 4 days call [employer] at [phone] for suggestions on securing your monitor.   Follow-Up: At [facility], you and your health needs are our priority.  As part of our continuing mission to provide you with exceptional heart care, we have created  designated Provider Care Teams.  These Care Teams include your primary Cardiologist (physician) and Advanced Practice Providers (APPs -  Physician Assistants and Nurse Practitioners) who all work together to provide you with the care you need, when you need it.  PLEASE SCHEDULE APPOINTMENT IN 2 WEEKS WITH PHARMACY FOR MEDICATION TITRATION Your next appointment:   2 month(s)  The format for your next appointment:   In Person  Provider:   Donato Heinz, MD

## 2022-09-08 NOTE — Progress Notes (Unsigned)
Enrolled patient for a 14 day Zio XT  monitor to be mailed to patients home  °

## 2022-09-08 NOTE — Telephone Encounter (Signed)
Pharmacy Patient Advocate Encounter  Received notification from [employer] [employer] that the request for prior authorization for Aimovig '140MG'$ /ML [place] [facility] has been denied due to See below.  Denial letter has also been scanned into chart under Media tab.      How would you like to proceed?  Please be advised appeals may take up to 5 business days to be submitted as pharmacist prepares necessary documentation.  Thank you!

## 2022-09-10 ENCOUNTER — Other Ambulatory Visit: Payer: Self-pay | Admitting: Neurology

## 2022-09-11 NOTE — Progress Notes (Deleted)
Office Visit Note  Patient: Virginia Douglas             Date of Birth: 1975-12-17           MRN: [id number]             PCP: [name], FNP Referring: [name], FNP Visit Date: 09/25/2022 Occupation: @GUAROCC$ @  Subjective:  No chief complaint on file.   History of Present Illness: Virginia Douglas is a 47 y.o. female ***     Activities of Daily Living:  Patient reports morning stiffness for *** {minute/hour:19697}.   Patient {ACTIONS;DENIES/REPORTS:21021675::"Denies"} nocturnal pain.  Difficulty dressing/grooming: {ACTIONS;DENIES/REPORTS:21021675::"Denies"} Difficulty climbing stairs: {ACTIONS;DENIES/REPORTS:21021675::"Denies"} Difficulty getting out of chair: {ACTIONS;DENIES/REPORTS:21021675::"Denies"} Difficulty using hands for taps, buttons, cutlery, and/or writing: {ACTIONS;DENIES/REPORTS:21021675::"Denies"}  No Rheumatology ROS completed.   PMFS History:  Patient Active Problem List   Diagnosis Date Noted   Cerebral venous thrombosis of sigmoid sinus 11/21/2021   Pulsatile tinnitus of right ear 11/21/2021   Pulsatile tinnitus, bilateral 06/27/2021   AVM (arteriovenous malformation) brain 05/31/2021   DDD (degenerative disc disease), cervical 12/06/2020   DDD (degenerative disc disease), lumbar 12/06/2020   Gastroesophageal reflux disease with esophagitis without hemorrhage 12/06/2020   Increased intracranial pressure 12/06/2020   PTSD (post-traumatic stress disorder) 12/06/2020   Attention deficit hyperactivity disorder (ADHD), predominantly inattentive type 12/06/2020   Vitamin D deficiency 12/06/2020   Obesity (BMI 30-39.9) 03/15/2020   Sciatic nerve pain 12/10/2019   Acute pain of left shoulder 06/05/2019   Grief 06/05/2019   Migraine 06/05/2019   Type 2 diabetes mellitus without complication, without long-term current use of insulin ([place]) 04/07/2019   Depression 04/07/2019   Obesity due to excess calories without serious comorbidity  04/07/2019   Left leg pain 04/07/2019   Anxiety 04/07/2019   Headache 07/06/2018   Diabetes ([place]) 12/08/2015   Essential hypertension 12/08/2015   Smoker 12/08/2015   LLQ pain 12/08/2015   Paresthesia 09/16/2015   Urinary urgency 09/16/2015    Past Medical History:  Diagnosis Date   ADHD (attention deficit hyperactivity disorder)    Anemia    iron deficiency   as a teenager   Anxiety    Arthritis    Diabetes ([place])    Edema    GERD (gastroesophageal reflux disease)    Hypertension    patient denies- patient stated that the PCP said it was for heart prevention.   Migraine headache     Family History  Problem Relation Age of Onset   Diabetes Mother    Hypertension Mother    Diabetes Father    Hypertension Father    Kidney failure Father    Hypertension Brother    Cancer Maternal Grandmother    Diabetes Maternal Grandmother    Pancreatic cancer Maternal Grandmother    Diabetes Paternal Grandmother    Breast cancer Paternal Grandmother    Stroke Paternal Grandfather    Healthy Son    Healthy Son    Cancer Maternal Aunt        breast   Breast cancer Maternal Aunt    Past Surgical History:  Procedure Laterality Date   ABDOMINAL HYSTERECTOMY  2012   CHOLECYSTECTOMY     COLONOSCOPY  12/28/2020   polyps   ESOPHAGEAL DILATION     gamma knife     04/2022   IR ANGIO INTRA EXTRACRAN SEL COM CAROTID INNOMINATE BILAT MOD SED  05/31/2021   IR ANGIO INTRA EXTRACRAN SEL COM CAROTID INNOMINATE BILAT MOD SED  06/27/2021  IR ANGIO INTRA EXTRACRAN SEL COM CAROTID INNOMINATE UNI R MOD SED  11/21/2021   IR ANGIO VERTEBRAL SEL VERTEBRAL UNI R MOD SED  05/31/2021   IR CT HEAD LTD  06/27/2021   IR CT HEAD LTD  11/21/2021   IR INTRA CRAN STENT  06/27/2021   IR INTRA CRAN STENT  11/21/2021   IR RADIOLOGIST EVAL & MGMT  06/03/2021   IR RADIOLOGIST EVAL & MGMT  07/13/2021   IR RADIOLOGIST EVAL & MGMT  08/27/2021   IR RADIOLOGIST EVAL & MGMT  10/30/2021   IR RADIOLOGIST EVAL & MGMT   12/06/2021   IR US GUIDE VASC ACCESS RIGHT  05/31/2021   IR US GUIDE VASC ACCESS RIGHT  06/27/2021   IR US GUIDE VASC ACCESS RIGHT  11/21/2021   OVARIAN CYST REMOVAL     RADIOLOGY WITH ANESTHESIA N/A 06/27/2021   Procedure: STENTING;  Surgeon: Luanne Bras, MD;  Location: [place];  Service: Radiology;  Laterality: N/A;   RADIOLOGY WITH ANESTHESIA N/A 11/21/2021   Procedure: IR WITH ANESTHESIA STENTING;  Surgeon: Luanne Bras, MD;  Location: [facility];  Service: Radiology;  Laterality: N/A;   Social History   Social History Narrative   In relationship, 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 She lives with her 2 children in a one story house. She drinks one large cup of coffee a day and an occasional tea or soda. She walks daily.      One story home      Immunization History  Administered Date(s) Administered   HPV 9-valent 04/11/2019   Influenza Inj Mdck Quad Pf 05/18/2018   Influenza,inj,Quad PF,6+ Mos 05/20/2017, 05/14/2019, 06/02/2020, 05/09/2022   Influenza-Unspecified 05/11/2017, 05/15/2018, 05/08/2019, 07/13/2021   Janssen (J&J) SARS-COV-2 Vaccination 11/23/2019   PFIZER(Purple Top)SARS-COV-2 Vaccination 07/11/2020   Pneumococcal Polysaccharide-23 04/08/2019, 07/15/2019   Tdap 07/02/2017, 04/09/2019     Objective: Vital Signs: There were no vitals taken for this visit.   Physical Exam   Musculoskeletal Exam: ***  CDAI Exam: CDAI Score: -- Patient Global: --; Provider Global: -- Swollen: --; Tender: -- Joint Exam 09/25/2022   No joint exam has been documented for this visit   There is currently no information documented on the homunculus. Go to the Rheumatology activity and complete the homunculus joint exam.  Investigation: No additional findings.  Imaging: ECHOCARDIOGRAM COMPLETE  Result Date: 09/01/2022     ECHOCARDIOGRAM REPORT   Patient Name:   Virginia Douglas Date of Exam: 09/01/2022 Medical Rec #:  [id number]              Height:       67.0 in Accession #:    [id number]             Weight:       223.2 lb Date of Birth:  [birth year]              BSA:          2.119 m Patient Age:    56 years               BP:           112/76 mmHg Patient Gender: F                      HR:  90 bpm. Exam Location:  [address] Procedure: 2D Echo, Cardiac Doppler and Color Doppler Indications:    R07.9* Chest pain, unspecified  History:        Patient has no prior history of Echocardiogram examinations.                 Abnormal ECG; Risk Factors:Hypertension, Diabetes and Former                 Smoker. Palpitations. Obesity.  Sonographer:    Diamond Nickel RCS Referring Phys: [id number] [place]  1. Left ventricular ejection fraction, by estimation, is 30 to 35%. Left ventricular ejection fraction by 2D MOD biplane is 30.4 %. The left ventricle has moderately decreased function. The left ventricle demonstrates global hypokinesis. Indeterminate diastolic filling due to E-A fusion.  2. Right ventricular systolic function is normal. The right ventricular size is normal. Tricuspid regurgitation signal is inadequate for assessing PA pressure.  3. Left atrial size was mildly dilated.  4. The mitral valve is grossly normal. Trivial mitral valve regurgitation.  5. The aortic valve is tricuspid. Aortic valve regurgitation is trivial.  6. The inferior vena cava is normal in size with greater than 50% respiratory variability, suggesting right atrial pressure of 3 mmHg. Comparison(s): No prior Echocardiogram. FINDINGS  Left Ventricle: Left ventricular ejection fraction, by estimation, is 30 to 35%. Left ventricular ejection fraction by 2D MOD biplane is 30.4 %. The left ventricle has moderately decreased function. The left ventricle demonstrates global hypokinesis. The left ventricular internal cavity size  was normal in size. There is no left ventricular hypertrophy. Indeterminate diastolic filling due to E-A fusion. Right Ventricle: The right ventricular size is normal. No increase in right ventricular wall thickness. Right ventricular systolic function is normal. Tricuspid regurgitation signal is inadequate for assessing PA pressure. Left Atrium: Left atrial size was mildly dilated. Right Atrium: Right atrial size was normal in size. Pericardium: There is no evidence of pericardial effusion. Mitral Valve: The mitral valve is grossly normal. Trivial mitral valve regurgitation. Tricuspid Valve: The tricuspid valve is grossly normal. Tricuspid valve regurgitation is trivial. Aortic Valve: The aortic valve is tricuspid. Aortic valve regurgitation is trivial. Aortic regurgitation PHT measures 186 msec. Pulmonic Valve: The pulmonic valve was normal in structure. Pulmonic valve regurgitation is not visualized. Aorta: The aortic root and ascending aorta are structurally normal, with no evidence of dilitation. Venous: The inferior vena cava is normal in size with greater than 50% respiratory variability, suggesting right atrial pressure of 3 mmHg. IAS/Shunts: No atrial level shunt detected by color flow Doppler.  LEFT VENTRICLE PLAX 2D                        Biplane EF (MOD) LVIDd:         5.40 cm         LV Biplane EF:   Left LVIDs:         4.00 cm                          ventricular LV PW:         1.00 cm                          ejection LV IVS:        0.80 cm  fraction by LVOT diam:     2.30 cm                          2D MOD LV SV:         82                               biplane is LV SV Index:   39                               30.4 %. LVOT Area:     4.15 cm                                Diastology                                LV e' medial:    14.10 cm/s LV Volumes (MOD)               LV E/e' medial:  9.5 LV vol d, MOD    114.4 ml      LV e' lateral:   15.20 cm/s A2C:                            LV E/e' lateral: 8.8 LV vol d, MOD    177.8 ml A4C: LV vol s, MOD    99.5 ml A2C: LV vol s, MOD    116.6 ml A4C: LV SV MOD A2C:   14.9 ml LV SV MOD A4C:   177.8 ml LV SV MOD BP:    47.2 ml RIGHT VENTRICLE RV Basal diam:  2.20 cm RV S prime:     13.20 cm/s TAPSE (M-mode): 2.5 cm LEFT ATRIUM             Index        RIGHT ATRIUM           Index LA diam:        3.70 cm 1.75 cm/m   RA Area:     13.20 cm LA Vol (A2C):   34.8 ml 16.42 ml/m  RA Volume:   29.70 ml  14.02 ml/m LA Vol (A4C):   43.5 ml 20.53 ml/m LA Biplane Vol: 40.4 ml 19.07 ml/m  AORTIC VALVE LVOT Vmax:   99.80 cm/s LVOT Vmean:  66.200 cm/s LVOT VTI:    0.197 m AI PHT:      186 msec  AORTA Ao Root diam: 2.80 cm Ao Asc diam:  2.80 cm MITRAL VALVE MV Area (PHT): 5.09 cm     SHUNTS MV Decel Time: 149 msec     Systemic VTI:  0.20 m MV E velocity: 134.00 cm/s  Systemic Diam: 2.30 cm MV A velocity: 56.40 cm/s MV E/A ratio:  2.38 Lyman Bishop MD Electronically signed by Lyman Bishop MD Signature Date/Time: 09/01/2022/10:40:46 AM    Final    CT CORONARY MORPH W/CTA COR W/SCORE Lewanda Rife W/CM &/OR WO/CM  Addendum Date: 08/25/2022   ADDENDUM REPORT: 08/25/2022 11:46 EXAM: OVER-READ INTERPRETATION  CT CHEST The following report is an over-read performed by radiologist Dr. Sabino Dick [facility] on 08/25/2022. This over-read does not include interpretation  of cardiac or coronary anatomy or pathology. The coronary CTA interpretation by the cardiologist is attached. COMPARISON:  None. FINDINGS: The visualized portions of the extracardiac vascular structures are unremarkable. Visualized mediastinum is unremarkable. Visualized portion of upper abdomen is unremarkable. Visualized pulmonary parenchyma is unremarkable. Visualized skeleton is unremarkable. IMPRESSION: No definite abnormality seen involving the visualized extracardiac structures of the chest. Electronically Signed   By: Marijo Conception M.D.   On: 08/25/2022 11:46   Result Date:  08/25/2022 HISTORY: Chest pain/anginal equiv, intermediate CAD risk, treadmill candidate EXAM: Cardiac/Coronary  CT TECHNIQUE: The patient was scanned on a [employer]. PROTOCOL: A 120 kV prospective scan was triggered in the descending thoracic aorta at 111 HU's. Axial non-contrast 3 mm slices were carried out through the heart. The data set was analyzed on a dedicated work station and scored using the Agatston method. Gantry rotation speed was 250 msecs and collimation was .6 mm. Beta blockade and 0.8 mg of sl NTG was given. The 3D data set was reconstructed in 5% intervals of the 35-75 % of the R-R cycle. Systolic and diastolic phases were analyzed on a dedicated work station using MPR, MIP and VRT modes. The patient received contrast: 151m OMNIPAQUE IOHEXOL 350 MG/ML SOLN. FINDINGS: Image quality: Average Noise artifact is: Motion from elevated heart rate Coronary calcium score is 0. Coronary arteries: Normal coronary origins.  Right dominance. Right Coronary Artery: No detectable plaque or stenosis. Left Main Coronary Artery: No detectable plaque or stenosis. Left Anterior Descending Coronary Artery: No detectable plaque or stenosis. Left Circumflex Artery: No detectable plaque or stenosis. Aorta: Normal size, 29 mm at the mid ascending aorta (level of the PA bifurcation) measured double oblique. No calcifications. No dissection. Aortic Valve: No calcifications.  AV appears tricuspid. Other findings: Normal pulmonary vein drainage into the left atrium. Normal left atrial appendage without thrombus. Normal size of the pulmonary artery. [facility] for non-cardiac findings. IMPRESSION: 1. No evidence of CAD, 0% stenosis, CADRADS 0. 2. Coronary calcium score of 0. 3. Normal coronary origins with right dominance. RECOMMENDATIONS: CAD-RADS 0. No evidence of CAD (0%). Consider non-atherosclerotic causes of chest pain. Electronically Signed: By: GCherlynn Kaiser M.D. On: 08/25/2022 11:02    Recent Labs: Lab Results  Component Value Date   WBC 9.5 07/25/2022   HGB 14.0 07/25/2022   PLT 320 07/25/2022   NA 139 07/25/2022   K 4.7 07/25/2022   CL 101 07/25/2022   CO2 23 07/25/2022   GLUCOSE 86 07/25/2022   BUN 7 07/25/2022   CREATININE 0.71 07/25/2022   BILITOT 0.3 07/25/2022   ALKPHOS 144 (H) 07/25/2022   AST 17 07/25/2022   ALT 22 07/25/2022   PROT 7.3 07/25/2022   ALBUMIN 5.0 (H) 07/25/2022   CALCIUM 10.3 (H) 07/25/2022   GFRAA 89 07/15/2020    Speciality Comments: No specialty comments available.  Procedures:  No procedures performed Allergies: Patient has no known allergies.   Assessment / Plan:     Visit Diagnoses: No diagnosis found.  Orders: No orders of the defined types were placed in this encounter.  No orders of the defined types were placed in this encounter.   Face-to-face time spent with patient was *** minutes. Greater than 50% of time was spent in counseling and coordination of care.  Follow-Up Instructions: No follow-ups on file.   MEarnestine Mealing CMA  Note - This record has been created using DEditor, commissioning  Chart creation errors  have been sought, but may not always  have been located. Such creation errors do not reflect on  the standard of medical care.

## 2022-09-13 ENCOUNTER — Other Ambulatory Visit (HOSPITAL_COMMUNITY): Payer: Self-pay

## 2022-09-13 ENCOUNTER — Telehealth: Payer: Self-pay

## 2022-09-13 DIAGNOSIS — R002 Palpitations: Secondary | ICD-10-CM

## 2022-09-13 NOTE — Telephone Encounter (Signed)
Patient Advocate Encounter   Received notification from [place] that prior authorization is required for Aimovig '140MG'$ /ML auto-injectors   Submitted: 09-13-2022 Key [id number]    Status is pending

## 2022-09-15 NOTE — Telephone Encounter (Signed)
Tried calling patient no answer. LMOVM to call the office back.  Per Dr.Jaffe, Unfortunately, Aimovig does not appear to be formulary.  Please contact patient to find out if she would rather try another injection, namely Emgality, or a daily pill, Qulipta.

## 2022-09-18 NOTE — Telephone Encounter (Signed)
Per Patient she will like to start Emgality.  PA team please start a PA for Emgaltiy 120 mg every 28 days

## 2022-09-21 NOTE — Telephone Encounter (Signed)
Patient Advocate Encounter  Received a fax from [place] regarding Prior Authorization for Aimovig '140MG'$ /ML auto-injectors .   Authorization has been DENIED due to .   Full determination letter attached to patient chart

## 2022-09-22 ENCOUNTER — Encounter: Payer: Self-pay | Admitting: Physical Medicine & Rehabilitation

## 2022-09-22 ENCOUNTER — Encounter
Payer: No Typology Code available for payment source | Attending: Physical Medicine & Rehabilitation | Admitting: Physical Medicine & Rehabilitation

## 2022-09-22 VITALS — BP 124/83 | HR 86 | Ht 67.0 in | Wt 224.0 lb

## 2022-09-22 DIAGNOSIS — M545 Low back pain, unspecified: Secondary | ICD-10-CM | POA: Diagnosis not present

## 2022-09-22 DIAGNOSIS — F32A Depression, unspecified: Secondary | ICD-10-CM

## 2022-09-22 DIAGNOSIS — M797 Fibromyalgia: Secondary | ICD-10-CM | POA: Diagnosis not present

## 2022-09-22 DIAGNOSIS — G8929 Other chronic pain: Secondary | ICD-10-CM | POA: Diagnosis present

## 2022-09-22 DIAGNOSIS — F900 Attention-deficit hyperactivity disorder, predominantly inattentive type: Secondary | ICD-10-CM

## 2022-09-22 MED ORDER — TIZANIDINE HCL 2 MG PO TABS: 2.0000 mg | ORAL_TABLET | Freq: Three times a day (TID) | ORAL | Status: DC | PRN

## 2022-09-22 NOTE — Telephone Encounter (Signed)
LMOVM for patient to call the office to discuss the Aimovig Denial.  Please contact patient with this update.  We can try another monthly injection, Emgality or a daily pill Qulipta.

## 2022-09-22 NOTE — Progress Notes (Addendum)
Subjective:    Patient ID: Virginia Douglas, female    DOB: 02/03/1976, 47 y.o.   MRN: [id number]  HPI Virginia Douglas is a 47 y.o. year old female  who  has a past medical history of ADHD (attention deficit hyperactivity disorder), Anemia, Anxiety, Arthritis, Diabetes ([place]), Edema, GERD (gastroesophageal reflux disease), Hypertension, and Migraine headache.   They are presenting to PM&R clinic as a new patient for pain management evaluation. They were referred  for treatment of chronic pain.  She has had chronic pain for many years.  Currently her worst pain is in her lower back.  She reports she recently had worsening of her pain in her shoulder and neck and she says she thinks this is worsened by stress.  Sometimes the pain in her lower back will shoot down her legs.  She reports having history of fibromyalgia.  She often sleeps with multiple pillows to help take the pressure off her joints.  She also wears a back brace during the day.  [facility]  She reports multiple ESI's and these did help the pain however there caused a lot of weight gain.  She reports that she was previously on hydrocodone however she stopped following with her pain doctor because they kept pushing her to do back injections and she was unhappy with the weight gain which she felt was associated with these injections.  She has very poor sleep.  Ports her mood is okay currently although has had depression in the past.  She has been seen by Dr. Laurance Flatten orthopedics for her neck and shoulder pain.  She does report some altered sensation in her hands and feet, she has a history of diabetes mellitus however this is not causing her significant pain.  She currently has a heart monitor in place due to concerns of arrhythmia.  She is followed by rheumatology for Raynaud's disease, polyarthralgia, sicca complex.  Red flag symptoms: Patient denies saddle anesthesia, loss of bowel  or bladder continence, new weakness, new numbness/tingling, or pain waking up at nighttime.  Medications tried: Tylenol and NSAIDS minimal benefit Takes Tumaric Butrans- minimal benefit Tramadol didn't help Gabapentin and Lyrica didn't help Hydrocodone was helping the pain Has not tried Tylenol 3 Tizanidine did not help her pain in the past.    Other treatments: Tens minimal help Epidurals helped but caused weight gain Back Brace [facility]   Pain Inventory Average Pain 8 Pain Right Now 8 My pain is constant, sharp, burning, stabbing, tingling, and aching  In the last 24 hours, has pain interfered with the following? General activity 7 Relation with others 7 Enjoyment of life 7 What TIME of day is your pain at its worst? morning , daytime, evening, night, and varies Sleep (in general) Poor  Pain is worse with: walking, bending, sitting, and standing Pain improves with: rest, medication, and injections Relief from Meds: 9  walk without assistance how many minutes can you walk? 15-20 ability to climb steps?  yes do you drive?  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  yes in the [place]  Any changes since last visit?  no    Family History  Problem Relation Age of Onset   Diabetes Mother    Hypertension Mother    Diabetes Father    Hypertension Father  Kidney failure Father    Hypertension Brother    Cancer Maternal Grandmother    Diabetes Maternal Grandmother    Pancreatic cancer Maternal Grandmother    Diabetes Paternal Grandmother    Breast cancer Paternal Grandmother    Stroke Paternal Grandfather    Healthy Son    Healthy Son    Cancer Maternal Aunt        breast   Breast cancer Maternal Aunt    Social History   Socioeconomic History   Marital status: Legally Separated    Spouse name: Jeneen Rinks    Number of children: 2   Years of education: 16   Highest education level: Some college, no degree  Occupational History   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[place] [employer]  Tobacco Use   Smoking status: Former    Packs/day: 0.50    Years: 25.00    Total pack years: 12.50    Types: Cigarettes    Start date: 03/24/2019    Passive exposure: Current   Smokeless tobacco: Never   Tobacco comments:    06/24/21- quit over a year ago  Vaping Use   Vaping Use: Never used  Substance and Sexual Activity   Alcohol use: No    Alcohol/week: 0.0 standard drinks of alcohol   Drug use: No   Sexual activity: Yes    Birth control/protection: Surgical  Other Topics Concern   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 She lives with her 2 children in a one story house. She drinks one large cup of coffee a day and an occasional tea or soda. She walks daily.      One story home      Social Determinants of Health   Financial Resource Strain: Not on file  Food Insecurity: Not on file  Transportation Needs: Not on file  Physical Activity: Not on file  Stress: Not on file  Social Connections: Not on file   Past Surgical History:  Procedure Laterality Date   ABDOMINAL HYSTERECTOMY  2012   CHOLECYSTECTOMY     COLONOSCOPY  12/28/2020   polyps   ESOPHAGEAL DILATION     gamma knife     04/2022   IR ANGIO INTRA EXTRACRAN SEL COM CAROTID INNOMINATE BILAT MOD SED  05/31/2021   IR ANGIO INTRA EXTRACRAN SEL COM CAROTID INNOMINATE BILAT MOD SED  06/27/2021   IR ANGIO INTRA EXTRACRAN SEL COM CAROTID INNOMINATE UNI R MOD SED  11/21/2021   IR ANGIO VERTEBRAL SEL VERTEBRAL UNI R MOD SED  05/31/2021   IR CT HEAD LTD  06/27/2021   IR CT HEAD LTD  11/21/2021   IR INTRA CRAN STENT   06/27/2021   IR INTRA CRAN STENT  11/21/2021   IR RADIOLOGIST EVAL & MGMT  06/03/2021   IR RADIOLOGIST EVAL & MGMT  07/13/2021   IR RADIOLOGIST EVAL & MGMT  08/27/2021   IR RADIOLOGIST EVAL & MGMT  10/30/2021   IR RADIOLOGIST EVAL & MGMT  12/06/2021   IR US GUIDE VASC ACCESS RIGHT  05/31/2021   IR US GUIDE VASC ACCESS RIGHT  06/27/2021   IR US GUIDE VASC ACCESS RIGHT  11/21/2021   OVARIAN CYST REMOVAL     RADIOLOGY WITH ANESTHESIA N/A 06/27/2021  Procedure: STENTING;  Surgeon: Luanne Bras, MD;  Location: [facility];  Service: Radiology;  Laterality: N/A;   RADIOLOGY WITH ANESTHESIA N/A 11/21/2021   Procedure: IR WITH ANESTHESIA STENTING;  Surgeon: Luanne Bras, MD;  Location: [facility];  Service: Radiology;  Laterality: N/A;   Past Medical History:  Diagnosis Date   ADHD (attention deficit hyperactivity disorder)    Anemia    iron deficiency   as a teenager   Anxiety    Arthritis    Diabetes ([place])    Edema    GERD (gastroesophageal reflux disease)    Hypertension    patient denies- patient stated that the PCP said it was for heart prevention.   Migraine headache    BP 124/83   Pulse 86   Ht '5\' 7"'$  (1.702 m)   Wt 224 lb (101.6 kg)   BMI 35.08 kg/m   Opioid Risk Score:   Fall Risk Score:  `1  Depression screen [facility] 2/9     09/22/2022    1:53 PM 07/25/2022    8:50 AM 01/31/2022    8:32 AM 02/28/2021    9:17 AM 11/16/2020    4:42 PM 07/15/2020    9:35 AM 12/18/2019    9:18 AM  Depression screen PHQ 2/9  Decreased Interest '1 1 1 '$ 0 '3 3 1  '$ Down, Depressed, Hopeless '1 1 1 '$ 0 '3 2 1  '$ PHQ - 2 Score '2 2 2 '$ 0 '6 5 2  '$ Altered sleeping '3 3 1 3 3 3 3  '$ Tired, decreased energy '1 3 1 3 3 3 3  '$ Change in appetite '1 3 1 3 3 2 3  '$ Feeling bad or failure about yourself  0 1 0 0 3 0 1  Trouble concentrating '2 3 1 '$ 0 3 0 3  Moving slowly or fidgety/restless 0 1 1 0 3 2 0  Suicidal thoughts 0 0 0 0 3 0 0  PHQ-9 Score '9 16 7 9 27 15 15  '$ Difficult doing work/chores  Somewhat difficult Not  difficult at all Not difficult at all Very difficult Somewhat difficult Very difficult    Review of Systems  Neurological:  Positive for weakness and numbness.       Tingling, spasms  Psychiatric/Behavioral:         Depression, anxiety       Objective:   Physical Exam Gen: no distress, normal appearing HEENT: oral mucosa pink and moist, NCAT Cardio: Reg rate Chest: normal effort, normal rate of breathing Abd: soft, non-distended Ext: no edema Psych: Very pleasant, normal affect Skin: intact Neuro: Alert and oriented, follows commands, cranial nerves II through XII intact, normal speech and language Sensation intact light touch in all 4 extremities Strength 5 out of 5 Musculoskeletal:  Lumbar paraspinal tenderness SLR, FABER, FADIR resulted in lower back pain Facet loading resulted in lower back pain Spurling's negative Tenderness throughout bilateral shoulders, elbows, wrists, knees, ankles, periscapular muscles to palpation    MRI L spine  Segmentation: The lowest lumbar type non-rib-bearing vertebra is labeled as L5.   Alignment:  Mild levoconvex lumbar scoliosis without subluxation.   Vertebrae:  No significant vertebral marrow edema is identified.   Conus medullaris and cauda equina: Conus extends to the L1 level. Conus and cauda equina appear normal.   Paraspinal and other soft tissues: Unremarkable   Disc levels:   T12-L1: Unremarkable.   L1-2: Unremarkable.   L2-3: Minimal disc bulge, no impingement.   L3-4: Minimal disc bulge, no impingement.  L4-5: Mild disc bulge, no impingement.   L5-S1: Borderline bilateral foraminal stenosis due to disc bulge and mild facet arthropathy   IMPRESSION: 1. Mild lumbar spondylosis and degenerative disc disease, causing borderline bilateral foraminal stenosis at L5-S1. 2. Mild levoconvex lumbar scoliosis without subluxation.   Performed   MRI C-spine 07/16/2022 1. Stable appearance of the cervical  spine. 2. Minimal uncovertebral spurring at C6-7 and C7-T1 without significant stenosis at these levels. 3. Moderate right foraminal stenosis at T2-3 due to facet spurring. 4. Empty sella. This is nonspecific and most often incidental, but can be seen in the setting of idiopathic intracranial hypertension. Assessment & Plan:   Chronic lower back pain and chronic neck pain -Prior MRI with minimal cervical spondylosis and mild lumbar spondylosis with mild degenerative disc disease resulting in borderline bilateral foraminal stenosis L5-S1, mild lumbar scoliosis EKG showed spondylosis and mild lumbar spondylosis -She reports significant benefit from PT in the past.  She reports she last had about a year ago.  She says [facility] and would like to retry PT at this location. -Will restart tizanidine as this was previously beneficial to her pain, did not start Flexeril due to cardiac evaluation is ongoing -[facility].  Previous use of hydrocodone and other controlled medications -She has not tried tylenol 3 -Provided list of foods that are good for chronic pain  Fibromyalgia -Continue duloxetine, appears this medicine is helping improve her pain -She reports poor response to gabapentin and duloxetine -Discussed gradual progressive low impact exercise -Counseled regarding sleep hygiene  Depression -Improved with duloxetine, she is also seeing therapist -Denies SI or HI  ADHD -She is on Adderall currently

## 2022-09-25 ENCOUNTER — Ambulatory Visit: Payer: No Typology Code available for payment source | Admitting: Rheumatology

## 2022-09-25 DIAGNOSIS — Z8669 Personal history of other diseases of the nervous system and sense organs: Secondary | ICD-10-CM

## 2022-09-25 DIAGNOSIS — F32A Depression, unspecified: Secondary | ICD-10-CM

## 2022-09-25 DIAGNOSIS — K2100 Gastro-esophageal reflux disease with esophagitis, without bleeding: Secondary | ICD-10-CM

## 2022-09-25 DIAGNOSIS — F900 Attention-deficit hyperactivity disorder, predominantly inattentive type: Secondary | ICD-10-CM

## 2022-09-25 DIAGNOSIS — Q282 Arteriovenous malformation of cerebral vessels: Secondary | ICD-10-CM

## 2022-09-25 DIAGNOSIS — I7300 Raynaud's syndrome without gangrene: Secondary | ICD-10-CM

## 2022-09-25 DIAGNOSIS — M5030 Other cervical disc degeneration, unspecified cervical region: Secondary | ICD-10-CM

## 2022-09-25 DIAGNOSIS — Z87891 Personal history of nicotine dependence: Secondary | ICD-10-CM

## 2022-09-25 DIAGNOSIS — F4310 Post-traumatic stress disorder, unspecified: Secondary | ICD-10-CM

## 2022-09-25 DIAGNOSIS — M3500 Sicca syndrome, unspecified: Secondary | ICD-10-CM

## 2022-09-25 DIAGNOSIS — M7918 Myalgia, other site: Secondary | ICD-10-CM

## 2022-09-25 DIAGNOSIS — R202 Paresthesia of skin: Secondary | ICD-10-CM

## 2022-09-25 DIAGNOSIS — G932 Benign intracranial hypertension: Secondary | ICD-10-CM

## 2022-09-25 DIAGNOSIS — G08 Intracranial and intraspinal phlebitis and thrombophlebitis: Secondary | ICD-10-CM

## 2022-09-25 DIAGNOSIS — G8929 Other chronic pain: Secondary | ICD-10-CM

## 2022-09-25 DIAGNOSIS — M5136 Other intervertebral disc degeneration, lumbar region: Secondary | ICD-10-CM

## 2022-09-25 DIAGNOSIS — M2550 Pain in unspecified joint: Secondary | ICD-10-CM

## 2022-09-25 DIAGNOSIS — I10 Essential (primary) hypertension: Secondary | ICD-10-CM

## 2022-09-25 DIAGNOSIS — E119 Type 2 diabetes mellitus without complications: Secondary | ICD-10-CM

## 2022-09-26 ENCOUNTER — Ambulatory Visit: Payer: No Typology Code available for payment source

## 2022-09-27 ENCOUNTER — Encounter: Payer: Self-pay | Admitting: Physical Medicine & Rehabilitation

## 2022-09-29 MED ORDER — TIZANIDINE HCL 4 MG PO TABS: 4.0000 mg | ORAL_TABLET | Freq: Three times a day (TID) | ORAL | 1 refills | Status: DC | PRN

## 2022-09-29 NOTE — Addendum Note (Signed)
Addended by: Jennye Boroughs on: 09/29/2022 03:10 PM   Modules accepted: Orders

## 2022-10-02 NOTE — Telephone Encounter (Signed)
I called the patient and was unable to speak to her, I LVM for her to return my call.

## 2022-10-02 NOTE — Telephone Encounter (Signed)
Patient returning call.

## 2022-10-03 ENCOUNTER — Other Ambulatory Visit (HOSPITAL_COMMUNITY): Payer: Self-pay | Admitting: Interventional Radiology

## 2022-10-03 DIAGNOSIS — I771 Stricture of artery: Secondary | ICD-10-CM

## 2022-10-03 MED ORDER — EMGALITY 120 MG/ML ~~LOC~~ SOAJ: 240.0000 mg | Freq: Once | SUBCUTANEOUS | 0 refills | Status: AC

## 2022-10-03 NOTE — Addendum Note (Signed)
Addended by: Venetia Night on: 10/03/2022 08:50 AM   Modules accepted: Orders

## 2022-10-11 ENCOUNTER — Ambulatory Visit: Payer: No Typology Code available for payment source | Admitting: Nurse Practitioner

## 2022-10-11 ENCOUNTER — Encounter: Payer: Self-pay | Admitting: Nurse Practitioner

## 2022-10-11 VITALS — BP 128/72 | HR 118 | Temp 98.7°F | Ht 67.0 in | Wt 218.0 lb

## 2022-10-11 DIAGNOSIS — I5042 Chronic combined systolic (congestive) and diastolic (congestive) heart failure: Secondary | ICD-10-CM

## 2022-10-11 DIAGNOSIS — I110 Hypertensive heart disease with heart failure: Secondary | ICD-10-CM

## 2022-10-11 DIAGNOSIS — L0232 Furuncle of buttock: Secondary | ICD-10-CM | POA: Diagnosis not present

## 2022-10-11 MED ORDER — CEPHALEXIN 500 MG PO CAPS: 500.0000 mg | ORAL_CAPSULE | Freq: Four times a day (QID) | ORAL | 0 refills | Status: AC

## 2022-10-11 NOTE — Patient Instructions (Signed)
Skin Abscess  A skin abscess is an infected area on or under your skin that contains a collection of pus and other material. An abscess may also be called a furuncle, carbuncle, or boil. An abscess can occur in or on almost any part of your body. Some abscesses break open (rupture) on their own. Most continue to get worse unless they are treated. The infection can spread deeper into the body and eventually into your blood, which can make you feel ill. Treatment usually involves draining the abscess. What are the causes? An abscess occurs when germs, like bacteria, pass through your skin and cause an infection. This may be caused by: A scrape or cut on your skin. A puncture wound through your skin, including a needle injection or insect bite. Blocked oil or sweat glands. Blocked and infected hair follicles. A cyst that forms beneath your skin (sebaceous cyst) and becomes infected. What increases the risk? This condition is more likely to develop in people who: Have a weak body defense system (immune system). Have diabetes. Have dry and irritated skin. Get frequent injections or use illegal IV drugs. Have a foreign body in a wound, such as a splinter. Have problems with their lymph system or veins. What are the signs or symptoms? Symptoms of this condition include: A painful, firm bump under the skin. A bump with pus at the top. This may break through the skin and drain. Other symptoms include: Redness surrounding the abscess site. Warmth. Swelling of the lymph nodes (glands) near the abscess. Tenderness. A sore on the skin. How is this diagnosed? This condition may be diagnosed based on: A physical exam. Your medical history. A sample of pus. This may be used to find out what is causing the infection. Blood tests. Imaging tests, such as an ultrasound, CT scan, or MRI. How is this treated? A small abscess that drains on its own may not need treatment. Treatment for larger abscesses  may include: Moist heat or heat pack applied to the area several times a day. A procedure to drain the abscess (incision and drainage). Antibiotic medicines. For a severe abscess, you may first get antibiotics through an IV and then change to antibiotics by mouth. Follow these instructions at home: Medicines  Take over-the-counter and prescription medicines only as told by your health care provider. If you were prescribed an antibiotic medicine, take it as told by your health care provider. Do not stop taking the antibiotic even if you start to feel better. Abscess care  If you have an abscess that has not drained, apply heat to the affected area. Use the heat source that your health care provider recommends, such as a moist heat pack or a heating pad. Place a towel between your skin and the heat source. Leave the heat on for 20-30 minutes. Remove the heat if your skin turns bright red. This is especially important if you are unable to feel pain, heat, or cold. You may have a greater risk of getting burned. Follow instructions from your health care provider about how to take care of your abscess. Make sure you: Cover the abscess with a bandage (dressing). Change your dressing or gauze as told by your health care provider. Wash your hands with soap and water before you change the dressing or gauze. If soap and water are not available, use hand sanitizer. Check your abscess every day for signs of a worsening infection. Check for: More redness, swelling, or pain. More fluid or blood. Warmth.   More pus or a bad smell. General instructions To avoid spreading the infection: Do not share personal care items, towels, or hot tubs with others. Avoid making skin contact with other people. Keep all follow-up visits as told by your health care provider. This is important. Contact a health care provider if you have: More redness, swelling, or pain around your abscess. More fluid or blood coming from  your abscess. Warm skin around your abscess. More pus or a bad smell coming from your abscess. Muscle aches. Chills or a general ill feeling. Get help right away if you: Have severe pain. See red streaks on your skin spreading away from the abscess. See redness that spreads quickly. Have a fever or chills. Summary A skin abscess is an infected area on or under your skin that contains a collection of pus and other material. A small abscess that drains on its own may not need treatment. Treatment for larger abscesses may include having a procedure to drain the abscess and taking an antibiotic. This information is not intended to replace advice given to you by your health care provider. Make sure you discuss any questions you have with your health care provider. Document Revised: 11/24/2021 Document Reviewed: 05/30/2021 Elsevier Patient Education  2023 [employer]  

## 2022-10-11 NOTE — Progress Notes (Addendum)
I,Seniya Stoffers,acting as a Education administrator for [name], FNP.,have documented all relevant documentation on the behalf of [name], FNP,as directed by  [name], FNP while in the presence of [name], [place].    Subjective:     Patient ID: Virginia Douglas , female    DOB: 04-Jan-1976 , 47 y.o.   MRN: [id number]   Chief Complaint  Patient presents with   Abscess    buttock    HPI  Patient presents today for possible abscess in her buttocks. She states it has been present for about 5 days, it did drain some but has not done so recently. She is also having pain to the area. She does report increased temps the first several days, but this has improved, Patient denies any history of abscesses.  She does not wax or shave in the area where she has the boil. She has been soaking in the bathtub daily without relief.   She has been diagnosed with CHF with Cardiiology       Past Medical History:  Diagnosis Date   ADHD (attention deficit hyperactivity disorder)    Anemia    iron deficiency   as a teenager   Anxiety    Arthritis    Diabetes ([place])    Edema    GERD (gastroesophageal reflux disease)    Hypertension    patient denies- patient stated that the PCP said it was for heart prevention.   Migraine headache      Family History  Problem Relation Age of Onset   Diabetes Mother    Hypertension Mother    Diabetes Father    Hypertension Father    Kidney failure Father    Hypertension Brother    Cancer Maternal Grandmother    Diabetes Maternal Grandmother    Pancreatic cancer Maternal Grandmother    Diabetes Paternal Grandmother    Breast cancer Paternal Grandmother    Stroke Paternal Grandfather    Healthy Son    Healthy Son    Cancer Maternal Aunt        breast   Breast cancer Maternal Aunt      Current Outpatient Medications:    ADDERALL XR 30 MG 24 hr capsule, Take 30 mg by mouth daily., Disp: , Rfl:    aspirin EC 81 MG tablet, Take 81 mg by mouth daily.  Swallow whole., Disp: , Rfl:    atorvastatin (LIPITOR) 10 MG tablet, TAKE 1 TABLET BY MOUTH EVERY DAY (Patient taking differently: Take 10 mg by mouth daily.), Disp: 90 tablet, Rfl: 1   buPROPion (WELLBUTRIN XL) 150 MG 24 hr tablet, TAKE 1 TABLET BY MOUTH EVERY DAY IN THE MORNING (Patient taking differently: Take 150 mg by mouth daily.), Disp: 90 tablet, Rfl: 1   cetirizine (ZYRTEC) 10 MG tablet, Take 10 mg by mouth daily as needed for allergies., Disp: , Rfl:    clopidogrel (PLAVIX) 75 MG tablet, Take 1 tablet (75 mg total) by mouth daily., Disp: 90 tablet, Rfl: 2   DULoxetine (CYMBALTA) 60 MG capsule, TAKE 1 CAPSULE BY MOUTH EVERY DAY (Patient taking differently: Take 60 mg by mouth daily.), Disp: 90 capsule, Rfl: 1   famotidine (PEPCID) 20 MG tablet, Take 20 mg by mouth daily., Disp: , Rfl:    hydrOXYzine (VISTARIL) 50 MG capsule, TAKE 1-2 CAPSULES BY MOUTH AS NEEDED FOR SLEEP AT BEDTIME (Patient taking differently: Take 100 mg by mouth at bedtime as needed (sleep). Take 1-2 capsules by mouth as needed for sleep  at bedtime), Disp: 180 capsule, Rfl: 1   Insulin Pen Needle (PEN NEEDLES) 32G X 4 MM MISC, 1 each by Does not apply route once a week., Disp: 30 each, Rfl: 3   linaclotide (LINZESS) 72 MCG capsule, Take 1 capsule (72 mcg total) by mouth daily before breakfast., Disp: 90 capsule, Rfl: 1   phentermine (ADIPEX-P) 37.5 MG tablet, Take 1 tablet (37.5 mg total) by mouth daily before breakfast., Disp: 30 tablet, Rfl: 1   Polyethyl Glycol-Propyl Glycol (SYSTANE) 0.4-0.3 % SOLN, Place 1 drop into both eyes daily as needed (dry eyes)., Disp: , Rfl:    acetaZOLAMIDE (DIAMOX) 250 MG tablet, Take 1 tablet (250 mg total) by mouth 2 (two) times daily., Disp: 180 tablet, Rfl: 1   carvedilol (COREG) 3.125 MG tablet, Take 2 tablets (6.25 mg total) by mouth 2 (two) times daily., Disp: 180 tablet, Rfl: 3   clonazePAM (KLONOPIN) 0.5 MG tablet, Take 1 tablet (0.5 mg total) by mouth 3 (three) times daily as  needed., Disp: 30 tablet, Rfl: 0   empagliflozin (JARDIANCE) 10 MG TABS tablet, Take 1 tablet by mouth once daily in the morning, Disp: 30 tablet, Rfl: 5   Galcanezumab-gnlm (EMGALITY) 120 MG/ML SOAJ, Inject 120 mg into the skin every 30 (thirty) days., Disp: 1.12 mL, Rfl: 5   insulin aspart (FIASP FLEXTOUCH) 100 UNIT/ML FlexTouch Pen, Inject [place] insulin before meals and at hs if BS < 150 - 0 units, 150-199 - 3 units, 200-249 - 6 units, 250-299 - 9 units, 300-349 - 12 units, >350 - 15 units. If >400 call our office with your blood sugar readings., Disp: 15 mL, Rfl: 1   insulin glargine (LANTUS) 100 UNIT/ML Solostar Pen, Inject 23 Units into the skin daily., Disp: 15 mL, Rfl: 1   Rimegepant Sulfate (NURTEC) 75 MG TBDP, Take 1 tablet (75 mg total) by mouth daily as needed., Disp: 8 tablet, Rfl: 11   sacubitril-valsartan (ENTRESTO) 24-26 MG, Take 1 tablet by mouth 2 (two) times daily., Disp: 56 tablet, Rfl: 0   spironolactone (ALDACTONE) 25 MG tablet, Take 0.5 tablets (12.5 mg total) by mouth daily., Disp: 45 tablet, Rfl: 3   tirzepatide (MOUNJARO) 5 MG/0.5ML Pen, Inject 5 mg into the skin once a week., Disp: 2 mL, Rfl: 0   tiZANidine (ZANAFLEX) 4 MG tablet, TAKE 1 TABLET (4 MG TOTAL) BY MOUTH EVERY 8 (EIGHT) HOURS AS NEEDED FOR MUSCLE SPASMS, Disp: 270 tablet, Rfl: 1   topiramate (TOPAMAX) 50 MG tablet, TAKE 2 AND 1/2 TABLETS (125 MG TOTAL) BY MOUTH AT BEDTIME. Strength: 50 mg, Disp: 225 tablet, Rfl: 1   Vitamin D, Ergocalciferol, (DRISDOL) 1.25 MG (50000 UNIT) CAPS capsule, TAKE 1 CAPSULE BY MOUTH EVERY 7 DAYS **NOT COVERED**, Disp: 12 capsule, Rfl: 0   No Known Allergies   Review of Systems  Constitutional: Negative.   Respiratory: Negative.    Cardiovascular: Negative.   Skin: Negative.        "I have a boil on my butt"  Neurological: Negative.   Psychiatric/Behavioral: Negative.       Today's Vitals   10/11/22 1507  BP: 128/72  Pulse: (!) 118  Temp: 98.7 F (37.1 C)  TempSrc: Oral   SpO2: 94%  Weight: 218 lb (98.9 kg)  Height: 5\' 7"  (1.702 m)  PainSc: 8   PainLoc: Buttocks   Body mass index is 34.14 kg/m.   Objective:  Physical Exam Vitals reviewed.  Constitutional:      General: She is not in  acute distress.    Appearance: Normal appearance. She is obese.  Cardiovascular:     Rate and Rhythm: Normal rate and regular rhythm.     Pulses: Normal pulses.     Heart sounds: Normal heart sounds.  Pulmonary:     Effort: Pulmonary effort is normal. No respiratory distress.  Skin:    General: Skin is warm and dry.     Capillary Refill: Capillary refill takes less than 2 seconds.     Comments: Left inner buttocks with firm abscess measuring approximately 2 inches.   Neurological:     General: No focal deficit present.     Mental Status: She is alert and oriented to person, place, and time.     Cranial Nerves: No cranial nerve deficit.     Motor: No weakness.  Psychiatric:        Mood and Affect: Mood normal.        Behavior: Behavior normal.        Thought Content: Thought content normal.        Judgment: Judgment normal.         Assessment And Plan:     1. Boil of buttock Comments: Unable to express exudate area is still firm. Left inner buttocks with firm abscess measuring approximately 2 in x 2 in. Will treat with ABX and warm water soak - cephALEXin (KEFLEX) 500 MG capsule; Take 1 capsule (500 mg total) by mouth 4 (four) times daily for 10 days.  Dispense: 40 capsule; Refill: 0  2. Chronic combined systolic and diastolic congestive heart failure (HCC) Comments: This is a new diagnosis with Cardiology, she is scheduled on 2/14 for her MRI of her heart.  3. Hypertensive heart disease with chronic combined systolic and diastolic congestive heart failure ([place]) Comments: Continue f/u with Cardiology     Patient was given opportunity to ask questions. Patient verbalized understanding of the plan and was able to repeat key elements of the plan. All  questions were answered to their satisfaction.  [name], FNP   I, [name], FNP, have reviewed all documentation for this visit. The documentation on 10/11/22 for the exam, diagnosis, procedures, and orders are all accurate and complete.   IF YOU HAVE BEEN REFERRED TO A SPECIALIST, IT MAY TAKE 1-2 WEEKS TO SCHEDULE/PROCESS THE REFERRAL. IF YOU HAVE NOT HEARD FROM US/SPECIALIST IN TWO WEEKS, PLEASE GIVE [place] A CALL AT [phone].   THE PATIENT IS ENCOURAGED TO PRACTICE SOCIAL DISTANCING DUE TO THE COVID-19 PANDEMIC.

## 2022-10-12 ENCOUNTER — Ambulatory Visit: Payer: No Typology Code available for payment source | Attending: Cardiology | Admitting: Pharmacist

## 2022-10-12 ENCOUNTER — Encounter: Payer: Self-pay | Admitting: Pharmacist

## 2022-10-12 VITALS — BP 116/79 | HR 95 | Wt 218.6 lb

## 2022-10-12 DIAGNOSIS — I5042 Chronic combined systolic (congestive) and diastolic (congestive) heart failure: Secondary | ICD-10-CM

## 2022-10-12 DIAGNOSIS — E119 Type 2 diabetes mellitus without complications: Secondary | ICD-10-CM | POA: Diagnosis not present

## 2022-10-12 LAB — CBC

## 2022-10-12 MED ORDER — BLOOD GLUCOSE TEST VI STRP: 1.0000 | ORAL_STRIP | Freq: Three times a day (TID) | 0 refills | Status: DC

## 2022-10-12 MED ORDER — CARVEDILOL 3.125 MG PO TABS: 6.2500 mg | ORAL_TABLET | Freq: Two times a day (BID) | ORAL | 3 refills | Status: DC

## 2022-10-12 MED ORDER — LANCETS MISC. MISC: 1.0000 | Freq: Three times a day (TID) | 0 refills | Status: DC

## 2022-10-12 MED ORDER — EMPAGLIFLOZIN 10 MG PO TABS: ORAL_TABLET | ORAL | 5 refills | Status: DC

## 2022-10-12 MED ORDER — BLOOD GLUCOSE MONITORING SUPPL DEVI: 1.0000 | Freq: Three times a day (TID) | 0 refills | Status: DC

## 2022-10-12 MED ORDER — LANCET DEVICE MISC: 1.0000 | Freq: Three times a day (TID) | 0 refills | Status: DC

## 2022-10-12 NOTE — Progress Notes (Signed)
.Patient ID: Virginia Douglas                 DOB: 17-Mar-1976                      MRN: [id number]     HPI: Virginia Douglas is a 46 y.o. female referred by Dr. Gardiner Rhyme to pharmacy clinic for HF medication management. PMH is significant for HTN, T2DM, history of smoking, and ADHD. Most recent LVEF 30-35% on 09/01/22.  Patient presents today for initial CHF consult. Patient feels poorly. Has slight swelling in left lower extremity and occasional SOB. Not sleeping well and does not feel like she has an appetite. Reports occasional chest pain.  Checked BP last night: 144/72.   Takes Adderall for ADHD and has clonazepam and hydroxyzine for anxiety and sleep.  On Trulicity for Y8MV. Last A1c 6.7. Does not know home glucose readings because she reports she news a new meter.  Pulse remains elevated. At last visit with Dr Gardiner Rhyme patient was stared on low dose carvedilol and lisinopril/HCTZ was switched to losartan. Has follow up in 1 month.  Current CHF meds:  Carvedilol 3.'125mg'$  BID Losartan '50mg'$  daily  BP goal: <130/80  Wt Readings from Last 3 Encounters:  10/11/22 218 lb (98.9 kg)  09/22/22 224 lb (101.6 kg)  09/08/22 227 lb (103 kg)   BP Readings from Last 3 Encounters:  10/11/22 128/72  09/22/22 124/83  09/08/22 116/72   Pulse Readings from Last 3 Encounters:  10/11/22 (!) 118  09/22/22 86  09/08/22 (!) 105    Renal function: CrCl cannot be calculated (Patient's most recent lab result is older than the maximum 21 days allowed.).  Past Medical History:  Diagnosis Date   ADHD (attention deficit hyperactivity disorder)    Anemia    iron deficiency   as a teenager   Anxiety    Arthritis    Diabetes ([place])    Edema    GERD (gastroesophageal reflux disease)    Hypertension    patient denies- patient stated that the PCP said it was for heart prevention.   Migraine headache     Current Outpatient Medications on File Prior to Visit  Medication Sig Dispense  Refill   acetaZOLAMIDE (DIAMOX) 250 MG tablet TAKE 1 TABLET BY MOUTH TWICE A DAY 60 tablet 2   ADDERALL XR 30 MG 24 hr capsule Take 30 mg by mouth daily.     aspirin EC 81 MG tablet Take 81 mg by mouth daily. Swallow whole.     atorvastatin (LIPITOR) 10 MG tablet TAKE 1 TABLET BY MOUTH EVERY DAY 90 tablet 1   Blood Glucose Monitoring Suppl (ONETOUCH VERIO IQ SYSTEM) w/Device KIT by Does not apply route. Use to check blood sugars twice     Boric Acid Vaginal (AZO BORIC ACID) 600 MG SUPP Place 1 suppository vaginally at bedtime. 14 suppository 0   buPROPion (WELLBUTRIN XL) 150 MG 24 hr tablet TAKE 1 TABLET BY MOUTH EVERY DAY IN THE MORNING 90 tablet 1   carvedilol (COREG) 3.125 MG tablet Take 1 tablet (3.125 mg total) by mouth 2 (two) times daily. 180 tablet 3   cephALEXin (KEFLEX) 500 MG capsule Take 1 capsule (500 mg total) by mouth 4 (four) times daily for 10 days. 40 capsule 0   cetirizine (ZYRTEC) 10 MG tablet Take 10 mg by mouth daily as needed for allergies.     clonazePAM (KLONOPIN) 0.5 MG tablet TAKE 1 TABLET  BY MOUTH THREE TIMES A DAY AS NEEDED 30 tablet 1   clopidogrel (PLAVIX) 75 MG tablet Take 1 tablet (75 mg total) by mouth daily. 90 tablet 2   Dulaglutide (TRULICITY) 1.5 BT/6.6MA SOPN INJECT 1.5 MG INTO THE SKIN ONCE A WEEK. 2 mL 5   DULoxetine (CYMBALTA) 60 MG capsule TAKE 1 CAPSULE BY MOUTH EVERY DAY 90 capsule 1   EMGALITY 120 MG/ML SOAJ Inject into the skin.     famotidine (PEPCID) 20 MG tablet Take 20 mg by mouth daily.     glucose blood test strip 1 each by Other route as needed for other. Insert 1 by subcutaneous route 2 times every day check blood sugar before breakfast and dinner     hydrOXYzine (VISTARIL) 50 MG capsule TAKE 1-2 CAPSULES BY MOUTH AS NEEDED FOR SLEEP AT BEDTIME 180 capsule 1   Insulin Pen Needle (PEN NEEDLES) 32G X 4 MM MISC 1 each by Does not apply route once a week. 30 each 3   linaclotide (LINZESS) 72 MCG capsule Take 1 capsule (72 mcg total) by mouth  daily before breakfast. 90 capsule 1   losartan (COZAAR) 50 MG tablet Take 1 tablet (50 mg total) by mouth daily. 90 tablet 3   ondansetron (ZOFRAN-ODT) 4 MG disintegrating tablet 4 mg daily as needed for nausea or vomiting.     phentermine (ADIPEX-P) 37.5 MG tablet Take 1 tablet (37.5 mg total) by mouth daily before breakfast. 30 tablet 1   Polyethyl Glycol-Propyl Glycol (SYSTANE) 0.4-0.3 % SOLN Place 1 drop into both eyes daily.     tiZANidine (ZANAFLEX) 4 MG tablet Take 1 tablet (4 mg total) by mouth every 8 (eight) hours as needed for muscle spasms. 90 tablet 1   topiramate (TOPAMAX) 50 MG tablet TAKE 2 AND 1/2 TABLETS (125 MG TOTAL) BY MOUTH AT BEDTIME. 225 tablet 1   Vitamin D, Ergocalciferol, (DRISDOL) 1.25 MG (50000 UNIT) CAPS capsule TAKE 1 CAPSULE BY MOUTH EVERY 7 DAYS **NOT COVERED** 12 capsule 0   [DISCONTINUED] metFORMIN (GLUCOPHAGE-XR) 500 MG 24 hr tablet Take 2 tablets (1,000 mg total) by mouth 2 (two) times daily. (Patient not taking: Reported on 06/05/2019) 30 tablet 0   [DISCONTINUED] Phentermine-Topiramate 11.25-69 MG CP24 Take 1 tablet by mouth daily. 30 capsule 1   No current facility-administered medications on file prior to visit.    No Known Allergies   Assessment/Plan:  1. CHF -  Patient BP in room 116/79 which is at goal of <130/80. Pulse remains elevated however. Adderall and anxiety possibly contributory.  Discussed pathophysiology of CHF and role of different medications in GDMT. Due underlying DM, recommend starting SGLT2i and patient agreeable. Copay cards given.  Also will send in new glucose monitoring supplies. Will increase carvedilol to 6.'25mg'$  BID. Continue losartan '50mg'$  daily. Recheck in 1 month.  Continue losartan '50mg'$  daily Start Jardiance '10mg'$  daily Increase carvedilol to 6.'25mg'$  BID Follow up with Dr Gardiner Rhyme and PharmD in 4 weeks  Karren Cobble, PharmD, [place], [place], [place], [place], [place], [address] Phone: [phone], Fax:  [phone]

## 2022-10-12 NOTE — Telephone Encounter (Signed)
[facility] reported a critical value- Glucose= 551. Walked over to [employer], [facility] office and gave him the result in person

## 2022-10-12 NOTE — Patient Instructions (Addendum)
It was nice meeting you today  We would like your blood pressure to be less than 130/80  Please continue your losartan '50mg'$  daily Please increase your carvedilol to 2 tablets twice a day  (6.'25mg'$  twice daily)  We will start a new medication called Jardiance '10mg'$  once a day in the morning  I have also sent in a new prescription for your blood sugar supplies  I will see you back in a month  Karren Cobble, PharmD, [place], [place], [place], [place], [place], [address] Phone: [phone], Fax: [phone]

## 2022-10-13 ENCOUNTER — Observation Stay (HOSPITAL_COMMUNITY)
Admission: EM | Admit: 2022-10-13 | Discharge: 2022-10-15 | Disposition: A | Discharge status: Home / Self Care | Payer: No Typology Code available for payment source | Attending: Internal Medicine | Admitting: Internal Medicine

## 2022-10-13 ENCOUNTER — Emergency Department (HOSPITAL_COMMUNITY): Payer: No Typology Code available for payment source

## 2022-10-13 ENCOUNTER — Other Ambulatory Visit (HOSPITAL_COMMUNITY): Payer: Self-pay

## 2022-10-13 ENCOUNTER — Encounter (HOSPITAL_COMMUNITY): Payer: Self-pay | Admitting: Emergency Medicine

## 2022-10-13 ENCOUNTER — Other Ambulatory Visit: Payer: Self-pay

## 2022-10-13 DIAGNOSIS — F419 Anxiety disorder, unspecified: Secondary | ICD-10-CM

## 2022-10-13 DIAGNOSIS — Q282 Arteriovenous malformation of cerebral vessels: Secondary | ICD-10-CM | POA: Diagnosis not present

## 2022-10-13 DIAGNOSIS — I10 Essential (primary) hypertension: Secondary | ICD-10-CM

## 2022-10-13 DIAGNOSIS — E876 Hypokalemia: Secondary | ICD-10-CM | POA: Diagnosis not present

## 2022-10-13 DIAGNOSIS — Z79899 Other long term (current) drug therapy: Secondary | ICD-10-CM | POA: Insufficient documentation

## 2022-10-13 DIAGNOSIS — Z7902 Long term (current) use of antithrombotics/antiplatelets: Secondary | ICD-10-CM | POA: Diagnosis not present

## 2022-10-13 DIAGNOSIS — Z794 Long term (current) use of insulin: Secondary | ICD-10-CM | POA: Diagnosis not present

## 2022-10-13 DIAGNOSIS — E669 Obesity, unspecified: Secondary | ICD-10-CM | POA: Diagnosis present

## 2022-10-13 DIAGNOSIS — F32A Depression, unspecified: Secondary | ICD-10-CM | POA: Diagnosis present

## 2022-10-13 DIAGNOSIS — K2100 Gastro-esophageal reflux disease with esophagitis, without bleeding: Secondary | ICD-10-CM | POA: Diagnosis present

## 2022-10-13 DIAGNOSIS — I5042 Chronic combined systolic (congestive) and diastolic (congestive) heart failure: Secondary | ICD-10-CM | POA: Diagnosis not present

## 2022-10-13 DIAGNOSIS — F909 Attention-deficit hyperactivity disorder, unspecified type: Secondary | ICD-10-CM | POA: Diagnosis not present

## 2022-10-13 DIAGNOSIS — Z87891 Personal history of nicotine dependence: Secondary | ICD-10-CM | POA: Insufficient documentation

## 2022-10-13 DIAGNOSIS — E559 Vitamin D deficiency, unspecified: Secondary | ICD-10-CM | POA: Diagnosis not present

## 2022-10-13 DIAGNOSIS — Z7984 Long term (current) use of oral hypoglycemic drugs: Secondary | ICD-10-CM | POA: Insufficient documentation

## 2022-10-13 DIAGNOSIS — Z7985 Long-term (current) use of injectable non-insulin antidiabetic drugs: Secondary | ICD-10-CM | POA: Diagnosis not present

## 2022-10-13 DIAGNOSIS — E1110 Type 2 diabetes mellitus with ketoacidosis without coma: Principal | ICD-10-CM | POA: Diagnosis present

## 2022-10-13 DIAGNOSIS — I110 Hypertensive heart disease with heart failure: Secondary | ICD-10-CM | POA: Insufficient documentation

## 2022-10-13 DIAGNOSIS — K219 Gastro-esophageal reflux disease without esophagitis: Secondary | ICD-10-CM

## 2022-10-13 DIAGNOSIS — F4310 Post-traumatic stress disorder, unspecified: Secondary | ICD-10-CM | POA: Diagnosis present

## 2022-10-13 DIAGNOSIS — Z7982 Long term (current) use of aspirin: Secondary | ICD-10-CM | POA: Insufficient documentation

## 2022-10-13 DIAGNOSIS — F900 Attention-deficit hyperactivity disorder, predominantly inattentive type: Secondary | ICD-10-CM | POA: Diagnosis present

## 2022-10-13 DIAGNOSIS — E1165 Type 2 diabetes mellitus with hyperglycemia: Secondary | ICD-10-CM | POA: Diagnosis present

## 2022-10-13 DIAGNOSIS — G43909 Migraine, unspecified, not intractable, without status migrainosus: Secondary | ICD-10-CM | POA: Diagnosis present

## 2022-10-13 LAB — CBC
HCT: 39.2 % (ref 36.0–46.0)
Hematocrit: 40.7 % (ref 34.0–46.6)
Hemoglobin: 13.5 g/dL (ref 11.1–15.9)
Hemoglobin: 13.4 g/dL (ref 12.0–15.0)
MCH: 28.1 pg (ref 26.6–33.0)
MCH: 28.4 pg (ref 26.0–34.0)
MCHC: 33.2 g/dL (ref 31.5–35.7)
MCHC: 34.2 g/dL (ref 30.0–36.0)
MCV: 85 fL (ref 79–97)
MCV: 83.1 fL (ref 80.0–100.0)
Platelets: 321 10*3/uL (ref 150–450)
Platelets: 308 10*3/uL (ref 150–400)
RBC: 4.81 x10E6/uL (ref 3.77–5.28)
RBC: 4.72 MIL/uL (ref 3.87–5.11)
RDW: 13.9 % (ref 11.7–15.4)
RDW: 13.9 % (ref 11.5–15.5)
WBC: 10 10*3/uL (ref 3.4–10.8)
WBC: 10.9 10*3/uL — ABNORMAL HIGH (ref 4.0–10.5)
nRBC: 0 % (ref 0.0–0.2)

## 2022-10-13 LAB — BASIC METABOLIC PANEL
Anion gap: 16 — ABNORMAL HIGH (ref 5–15)
Anion gap: 15 (ref 5–15)
Anion gap: 12 (ref 5–15)
Anion gap: 11 (ref 5–15)
BUN/Creatinine Ratio: 13 (ref 9–23)
BUN: 9 mg/dL (ref 6–24)
BUN: 9 mg/dL (ref 6–20)
BUN: 8 mg/dL (ref 6–20)
BUN: 10 mg/dL (ref 6–20)
BUN: 10 mg/dL (ref 6–20)
CO2: 21 mmol/L (ref 20–29)
CO2: 18 mmol/L — ABNORMAL LOW (ref 22–32)
CO2: 20 mmol/L — ABNORMAL LOW (ref 22–32)
CO2: 22 mmol/L (ref 22–32)
CO2: 21 mmol/L — ABNORMAL LOW (ref 22–32)
Calcium: 10.2 mg/dL (ref 8.7–10.2)
Calcium: 9.2 mg/dL (ref 8.9–10.3)
Calcium: 9.6 mg/dL (ref 8.9–10.3)
Calcium: 9.3 mg/dL (ref 8.9–10.3)
Calcium: 9 mg/dL (ref 8.9–10.3)
Chloride: 95 mmol/L — ABNORMAL LOW (ref 96–106)
Chloride: 94 mmol/L — ABNORMAL LOW (ref 98–111)
Chloride: 97 mmol/L — ABNORMAL LOW (ref 98–111)
Chloride: 103 mmol/L (ref 98–111)
Chloride: 105 mmol/L (ref 98–111)
Creatinine, Ser: 0.68 mg/dL (ref 0.57–1.00)
Creatinine, Ser: 0.86 mg/dL (ref 0.44–1.00)
Creatinine, Ser: 0.74 mg/dL (ref 0.44–1.00)
Creatinine, Ser: 0.7 mg/dL (ref 0.44–1.00)
Creatinine, Ser: 0.68 mg/dL (ref 0.44–1.00)
GFR, Estimated: >60 mL/min (ref >60)
GFR, Estimated: >60 mL/min (ref >60)
GFR, Estimated: >60 mL/min (ref >60)
GFR, Estimated: >60 mL/min (ref >60)
Glucose, Bld: 765 mg/dL (ref 70–99)
Glucose, Bld: 519 mg/dL (ref 70–99)
Glucose, Bld: 256 mg/dL — ABNORMAL HIGH (ref 70–99)
Glucose, Bld: 198 mg/dL — ABNORMAL HIGH (ref 70–99)
Glucose: 551 mg/dL (ref 70–99)
Potassium: 5 mmol/L (ref 3.5–5.2)
Potassium: 4.2 mmol/L (ref 3.5–5.1)
Potassium: 4.2 mmol/L (ref 3.5–5.1)
Potassium: 3.6 mmol/L (ref 3.5–5.1)
Potassium: 4.2 mmol/L (ref 3.5–5.1)
Sodium: 132 mmol/L — ABNORMAL LOW (ref 134–144)
Sodium: 128 mmol/L — ABNORMAL LOW (ref 135–145)
Sodium: 132 mmol/L — ABNORMAL LOW (ref 135–145)
Sodium: 137 mmol/L (ref 135–145)
Sodium: 137 mmol/L (ref 135–145)
eGFR: 109 mL/min/{1.73_m2} (ref >59)

## 2022-10-13 LAB — URINALYSIS, ROUTINE W REFLEX MICROSCOPIC
Bilirubin Urine: NEGATIVE
Glucose, UA: >=500 mg/dL — AB
Hgb urine dipstick: NEGATIVE
Ketones, ur: NEGATIVE mg/dL
Leukocytes,Ua: NEGATIVE
Nitrite: NEGATIVE
Protein, ur: NEGATIVE mg/dL
Specific Gravity, Urine: 1.037 — ABNORMAL HIGH (ref 1.005–1.030)
pH: 5 (ref 5.0–8.0)

## 2022-10-13 LAB — CBC WITH DIFFERENTIAL/PLATELET
Abs Immature Granulocytes: 0.03 10*3/uL (ref 0.00–0.07)
Basophils Absolute: 0.1 10*3/uL (ref 0.0–0.1)
Basophils Relative: 1 %
Eosinophils Absolute: 0.2 10*3/uL (ref 0.0–0.5)
Eosinophils Relative: 2 %
HCT: 39.1 % (ref 36.0–46.0)
Hemoglobin: 13.1 g/dL (ref 12.0–15.0)
Immature Granulocytes: 0 %
Lymphocytes Relative: 35 %
Lymphs Abs: 3.4 10*3/uL (ref 0.7–4.0)
MCH: 28.6 pg (ref 26.0–34.0)
MCHC: 33.5 g/dL (ref 30.0–36.0)
MCV: 85.4 fL (ref 80.0–100.0)
Monocytes Absolute: 0.6 10*3/uL (ref 0.1–1.0)
Monocytes Relative: 6 %
Neutro Abs: 5.4 10*3/uL (ref 1.7–7.7)
Neutrophils Relative %: 56 %
Platelets: 291 10*3/uL (ref 150–400)
RBC: 4.58 MIL/uL (ref 3.87–5.11)
RDW: 14 % (ref 11.5–15.5)
WBC: 9.7 10*3/uL (ref 4.0–10.5)
nRBC: 0 % (ref 0.0–0.2)

## 2022-10-13 LAB — GLUCOSE, CAPILLARY
Glucose-Capillary: 126 mg/dL — ABNORMAL HIGH (ref 70–99)
Glucose-Capillary: 200 mg/dL — ABNORMAL HIGH (ref 70–99)
Glucose-Capillary: 207 mg/dL — ABNORMAL HIGH (ref 70–99)
Glucose-Capillary: 269 mg/dL — ABNORMAL HIGH (ref 70–99)
Glucose-Capillary: 291 mg/dL — ABNORMAL HIGH (ref 70–99)
Glucose-Capillary: 283 mg/dL — ABNORMAL HIGH (ref 70–99)
Glucose-Capillary: 218 mg/dL — ABNORMAL HIGH (ref 70–99)
Glucose-Capillary: 204 mg/dL — ABNORMAL HIGH (ref 70–99)
Glucose-Capillary: 216 mg/dL — ABNORMAL HIGH (ref 70–99)
Glucose-Capillary: 189 mg/dL — ABNORMAL HIGH (ref 70–99)

## 2022-10-13 LAB — I-STAT VENOUS BLOOD GAS, ED
Acid-base deficit: 1 mmol/L (ref 0.0–2.0)
Bicarbonate: 22.8 mmol/L (ref 20.0–28.0)
Calcium, Ion: 1.19 mmol/L (ref 1.15–1.40)
HCT: 40 % (ref 36.0–46.0)
Hemoglobin: 13.6 g/dL (ref 12.0–15.0)
O2 Saturation: 97 %
Potassium: 4.2 mmol/L (ref 3.5–5.1)
Sodium: 133 mmol/L — ABNORMAL LOW (ref 135–145)
TCO2: 24 mmol/L (ref 22–32)
pCO2, Ven: 33.6 mmHg — ABNORMAL LOW (ref 44–60)
pH, Ven: 7.439 — ABNORMAL HIGH (ref 7.25–7.43)
pO2, Ven: 87 mmHg — ABNORMAL HIGH (ref 32–45)

## 2022-10-13 LAB — TROPONIN I (HIGH SENSITIVITY)
Troponin I (High Sensitivity): 4 ng/L (ref <18)
Troponin I (High Sensitivity): 4 ng/L (ref <18)

## 2022-10-13 LAB — CBG MONITORING, ED
Glucose-Capillary: >600 mg/dL (ref 70–99)
Glucose-Capillary: 172 mg/dL — ABNORMAL HIGH (ref 70–99)
Glucose-Capillary: 360 mg/dL — ABNORMAL HIGH (ref 70–99)

## 2022-10-13 LAB — HEMOGLOBIN A1C
Hgb A1c MFr Bld: 11.4 % — ABNORMAL HIGH (ref 4.8–5.6)
Mean Plasma Glucose: 280.48 mg/dL

## 2022-10-13 LAB — BETA-HYDROXYBUTYRIC ACID
Beta-Hydroxybutyric Acid: 0.23 mmol/L (ref 0.05–0.27)
Beta-Hydroxybutyric Acid: 0.24 mmol/L (ref 0.05–0.27)

## 2022-10-13 MED ORDER — ACETAZOLAMIDE 250 MG PO TABS: 250.0000 mg | ORAL_TABLET | Freq: Two times a day (BID) | ORAL | Status: DC

## 2022-10-13 MED ORDER — BUPROPION HCL ER (XL) 150 MG PO TB24
150.0000 mg | ORAL_TABLET | Freq: Every day | ORAL | Status: DC
  Administered 2022-10-14 – 2022-10-15 (×2): 150 mg via ORAL
  Filled 2022-10-13 (×2): qty 1

## 2022-10-13 MED ORDER — CLONAZEPAM 0.5 MG PO TABS
0.5000 mg | ORAL_TABLET | Freq: Three times a day (TID) | ORAL | Status: DC | PRN
  Administered 2022-10-14: 0.5 mg via ORAL
  Filled 2022-10-13: qty 1

## 2022-10-13 MED ORDER — TOPIRAMATE 25 MG PO TABS
100.0000 mg | ORAL_TABLET | Freq: Every day | ORAL | Status: DC
  Administered 2022-10-14 – 2022-10-15 (×2): 100 mg via ORAL
  Filled 2022-10-13 (×2): qty 4

## 2022-10-13 MED ORDER — ASPIRIN 81 MG PO TBEC
81.0000 mg | DELAYED_RELEASE_TABLET | Freq: Every day | ORAL | Status: DC
  Administered 2022-10-14 – 2022-10-15 (×2): 81 mg via ORAL
  Filled 2022-10-13 (×2): qty 1

## 2022-10-13 MED ORDER — HYDROXYZINE HCL 25 MG PO TABS
50.0000 mg | ORAL_TABLET | Freq: Three times a day (TID) | ORAL | Status: DC | PRN
  Administered 2022-10-13 – 2022-10-14 (×2): 50 mg via ORAL
  Filled 2022-10-13 (×2): qty 2

## 2022-10-13 MED ORDER — AMPHETAMINE-DEXTROAMPHET ER 10 MG PO CP24
30.0000 mg | ORAL_CAPSULE | Freq: Every day | ORAL | Status: DC
  Administered 2022-10-14 – 2022-10-15 (×2): 30 mg via ORAL
  Filled 2022-10-13 (×2): qty 3

## 2022-10-13 MED ORDER — INSULIN REGULAR(HUMAN) IN NACL 100-0.9 UT/100ML-% IV SOLN
INTRAVENOUS | Status: DC
  Administered 2022-10-13: 1 [IU]/h via INTRAVENOUS
  Administered 2022-10-13: 4.4 [IU]/h via INTRAVENOUS
  Administered 2022-10-14: 2.8 [IU]/h via INTRAVENOUS
  Filled 2022-10-13 (×2): qty 100

## 2022-10-13 MED ORDER — FAMOTIDINE 20 MG PO TABS
20.0000 mg | ORAL_TABLET | Freq: Every day | ORAL | Status: DC
  Administered 2022-10-13 – 2022-10-15 (×3): 20 mg via ORAL
  Filled 2022-10-13 (×3): qty 1

## 2022-10-13 MED ORDER — CEPHALEXIN 250 MG PO CAPS
500.0000 mg | ORAL_CAPSULE | Freq: Four times a day (QID) | ORAL | Status: DC
  Administered 2022-10-13 – 2022-10-15 (×8): 500 mg via ORAL
  Filled 2022-10-13 (×8): qty 2

## 2022-10-13 MED ORDER — DEXTROSE IN LACTATED RINGERS 5 % IV SOLN: INTRAVENOUS | Status: DC

## 2022-10-13 MED ORDER — PHENTERMINE HCL 37.5 MG PO TABS: 37.5000 mg | ORAL_TABLET | Freq: Every day | ORAL | Status: DC

## 2022-10-13 MED ORDER — CARVEDILOL 3.125 MG PO TABS
6.2500 mg | ORAL_TABLET | Freq: Two times a day (BID) | ORAL | Status: DC
  Administered 2022-10-13 – 2022-10-15 (×4): 6.25 mg via ORAL
  Filled 2022-10-13 (×4): qty 2

## 2022-10-13 MED ORDER — POTASSIUM CHLORIDE 10 MEQ/100ML IV SOLN
10.0000 meq | INTRAVENOUS | Status: AC
  Administered 2022-10-13 (×4): 10 meq via INTRAVENOUS
  Filled 2022-10-13 (×4): qty 100

## 2022-10-13 MED ORDER — CLOPIDOGREL BISULFATE 75 MG PO TABS
75.0000 mg | ORAL_TABLET | Freq: Every day | ORAL | Status: DC
  Administered 2022-10-14 – 2022-10-15 (×2): 75 mg via ORAL
  Filled 2022-10-13 (×2): qty 1

## 2022-10-13 MED ORDER — LACTATED RINGERS IV SOLN: INTRAVENOUS | Status: DC

## 2022-10-13 MED ORDER — INSULIN STARTER KIT- PEN NEEDLES (ENGLISH)
1.0000 | Freq: Once | Status: AC
  Administered 2022-10-13: 1
  Filled 2022-10-13: qty 1

## 2022-10-13 MED ORDER — ONDANSETRON 4 MG PO TBDP
4.0000 mg | ORAL_TABLET | Freq: Every day | ORAL | Status: DC | PRN
  Administered 2022-10-14: 4 mg via ORAL
  Filled 2022-10-13: qty 1

## 2022-10-13 MED ORDER — DEXTROSE 50 % IV SOLN: 0.0000 mL | INTRAVENOUS | Status: DC | PRN

## 2022-10-13 MED ORDER — TIZANIDINE HCL 4 MG PO TABS: 4.0000 mg | ORAL_TABLET | Freq: Three times a day (TID) | ORAL | Status: DC | PRN

## 2022-10-13 MED ORDER — LACTATED RINGERS IV BOLUS: 20.0000 mL/kg | Freq: Once | INTRAVENOUS | Status: DC

## 2022-10-13 MED ORDER — BORIC ACID VAGINAL 600 MG VA SUPP: 1.0000 | Freq: Every day | VAGINAL | Status: DC

## 2022-10-13 MED ORDER — LACTATED RINGERS IV BOLUS
500.0000 mL | Freq: Once | INTRAVENOUS | Status: AC
  Administered 2022-10-13: 500 mL via INTRAVENOUS

## 2022-10-13 MED ORDER — EMPAGLIFLOZIN 10 MG PO TABS
10.0000 mg | ORAL_TABLET | Freq: Every day | ORAL | Status: DC
  Administered 2022-10-14 – 2022-10-15 (×2): 10 mg via ORAL
  Filled 2022-10-13 (×2): qty 1

## 2022-10-13 MED ORDER — ATORVASTATIN CALCIUM 10 MG PO TABS
10.0000 mg | ORAL_TABLET | Freq: Every day | ORAL | Status: DC
  Administered 2022-10-14 – 2022-10-15 (×2): 10 mg via ORAL
  Filled 2022-10-13 (×2): qty 1

## 2022-10-13 MED ORDER — POLYVINYL ALCOHOL 1.4 % OP SOLN
1.0000 [drp] | Freq: Every day | OPHTHALMIC | Status: DC
  Administered 2022-10-14 – 2022-10-15 (×2): 1 [drp] via OPHTHALMIC
  Filled 2022-10-13: qty 15

## 2022-10-13 MED ORDER — DULOXETINE HCL 30 MG PO CPEP
60.0000 mg | ORAL_CAPSULE | Freq: Every day | ORAL | Status: DC
  Administered 2022-10-14 – 2022-10-15 (×2): 60 mg via ORAL
  Filled 2022-10-13 (×2): qty 2

## 2022-10-13 MED ORDER — LINACLOTIDE 72 MCG PO CAPS
72.0000 ug | ORAL_CAPSULE | Freq: Every day | ORAL | Status: DC
  Administered 2022-10-14: 72 ug via ORAL
  Filled 2022-10-13 (×2): qty 1

## 2022-10-13 MED ORDER — INSULIN REGULAR(HUMAN) IN NACL 100-0.9 UT/100ML-% IV SOLN
INTRAVENOUS | Status: DC
  Administered 2022-10-13: 18 [IU]/h via INTRAVENOUS
  Filled 2022-10-13: qty 100

## 2022-10-13 MED ORDER — LIVING WELL WITH DIABETES BOOK
Freq: Once | Status: AC
  Filled 2022-10-13: qty 1

## 2022-10-13 MED ORDER — LOSARTAN POTASSIUM 50 MG PO TABS
50.0000 mg | ORAL_TABLET | Freq: Every day | ORAL | Status: DC
  Administered 2022-10-15: 50 mg via ORAL
  Filled 2022-10-13 (×2): qty 1

## 2022-10-13 MED ORDER — POTASSIUM CHLORIDE 10 MEQ/100ML IV SOLN
10.0000 meq | INTRAVENOUS | Status: AC
  Administered 2022-10-13: 10 meq via INTRAVENOUS
  Filled 2022-10-13: qty 100

## 2022-10-13 MED ORDER — ENOXAPARIN SODIUM 60 MG/0.6ML IJ SOSY
0.5000 mg/kg | PREFILLED_SYRINGE | INTRAMUSCULAR | Status: DC
  Administered 2022-10-13 – 2022-10-14 (×2): 50 mg via SUBCUTANEOUS
  Filled 2022-10-13 (×2): qty 0.6

## 2022-10-13 NOTE — Progress Notes (Signed)
Attempted to call Phlebotomy for BMP collection close to 1600 time period, no response.   Alvis Lemmings, RN 10/13/2022 4:23 PM

## 2022-10-13 NOTE — H&P (Addendum)
History and Physical    DOA: 10/13/2022  PCP: [name], FNP  Patient coming from: home  Chief Complaint: elevated BG  HPI: Virginia Douglas is a 47 y.o. female with history h/o hypertension, GERD, DM insulin-dependent at baseline, ADHD, systolic cardiomyopathy with EF 30-35% presents with complaints of abnormal labs.  Patient apparently had cardiology appointment yesterday and reported feeling weak and tired.  She has also been experiencing polydipsia and loss of appetite with poor oral intake.  Labs were sent which showed elevated blood glucose in 500s.  She was advised to come to the ED for further evaluation but patient chose not to. Overnight she felt worse and came this morning to the ED for further management. ED course: Afebrile, normotensive, pulse 90s, O2 sat 97% on room air.  Blood glucose here was 765.  WBC 9.7, hemoglobin 13, sodium 128, bicarb 18, anion gap 16.  Troponin 4-4.  Patient started on Endo tool for mild DKA and requested admission for further evaluation and management.  She is accompanied by her friend, Roselyn Reef when seen in ED.  Patient awake alert oriented x 3 and able to participate in history and physical exam.  She denies any dysuria but does report polyuria as discussed above.  She states until 2 weeks back her blood glucose was well-controlled and consistently less than 200.  She states she had a problem with her old blood glucose monitor and had just acquired a new monitor yesterday.  She denies any dietary changes and in fact reports feeling fatigued with no appetite.  She states she was surprised that her blood glucose was so elevated.  She has never had DKA in the past.  Her last hemoglobin A1c was 6.7.  Her only medication has been Trulicity for DKA for more than a year now.  She was just started on Jardiance yesterday by cardiologist.   Review of Systems: As per HPI, otherwise review of systems negative.    Past Medical History:  Diagnosis Date   ADHD  (attention deficit hyperactivity disorder)    Anemia    iron deficiency   as a teenager   Anxiety    Arthritis    Diabetes ([place])    Edema    GERD (gastroesophageal reflux disease)    Hypertension    patient denies- patient stated that the PCP said it was for heart prevention.   Migraine headache     Past Surgical History:  Procedure Laterality Date   ABDOMINAL HYSTERECTOMY  2012   CHOLECYSTECTOMY     COLONOSCOPY  12/28/2020   polyps   ESOPHAGEAL DILATION     gamma knife     04/2022   IR ANGIO INTRA EXTRACRAN SEL COM CAROTID INNOMINATE BILAT MOD SED  05/31/2021   IR ANGIO INTRA EXTRACRAN SEL COM CAROTID INNOMINATE BILAT MOD SED  06/27/2021   IR ANGIO INTRA EXTRACRAN SEL COM CAROTID INNOMINATE UNI R MOD SED  11/21/2021   IR ANGIO VERTEBRAL SEL VERTEBRAL UNI R MOD SED  05/31/2021   IR CT HEAD LTD  06/27/2021   IR CT HEAD LTD  11/21/2021   IR INTRA CRAN STENT  06/27/2021   IR INTRA CRAN STENT  11/21/2021   IR RADIOLOGIST EVAL & MGMT  06/03/2021   IR RADIOLOGIST EVAL & MGMT  07/13/2021   IR RADIOLOGIST EVAL & MGMT  08/27/2021   IR RADIOLOGIST EVAL & MGMT  10/30/2021   IR RADIOLOGIST EVAL & MGMT  12/06/2021   IR US GUIDE VASC ACCESS  RIGHT  05/31/2021   IR US GUIDE VASC ACCESS RIGHT  06/27/2021   IR US GUIDE VASC ACCESS RIGHT  11/21/2021   OVARIAN CYST REMOVAL     RADIOLOGY WITH ANESTHESIA N/A 06/27/2021   Procedure: STENTING;  Surgeon: Luanne Bras, MD;  Location: [place] [facility];  Service: Radiology;  Laterality: N/A;   RADIOLOGY WITH ANESTHESIA N/A 11/21/2021   Procedure: IR WITH ANESTHESIA STENTING;  Surgeon: Luanne Bras, MD;  Location: [facility];  Service: Radiology;  Laterality: N/A;    Social history:  reports that she has quit smoking. Her smoking use included cigarettes. She started smoking about 3 years ago. She has a 12.50 pack-year smoking history. She has been exposed to tobacco smoke. She has never used smokeless tobacco. She reports that she does not drink alcohol  and does not use drugs.   No Known Allergies  Family History  Problem Relation Age of Onset   Diabetes Mother    Hypertension Mother    Diabetes Father    Hypertension Father    Kidney failure Father    Hypertension Brother    Cancer Maternal Grandmother    Diabetes Maternal Grandmother    Pancreatic cancer Maternal Grandmother    Diabetes Paternal Grandmother    Breast cancer Paternal Grandmother    Stroke Paternal Grandfather    Healthy Son    Healthy Son    Cancer Maternal Aunt        breast   Breast cancer Maternal Aunt       Prior to Admission medications   Medication Sig Start Date End Date Taking? Authorizing Provider  acetaZOLAMIDE (DIAMOX) 250 MG tablet TAKE 1 TABLET BY MOUTH TWICE A DAY 03/23/21   Jaffe, Adam R, DO  ADDERALL XR 30 MG 24 hr capsule Take 30 mg by mouth daily. 09/29/20   [provider]  aspirin EC 81 MG tablet Take 81 mg by mouth daily. Swallow whole.    [provider]  atorvastatin (LIPITOR) 10 MG tablet TAKE 1 TABLET BY MOUTH EVERY DAY 07/12/22   [name], FNP  Boric Acid Vaginal (AZO BORIC ACID) 600 MG SUPP Place 1 suppository vaginally at bedtime. 02/02/22   Shelly Bombard, MD  buPROPion (WELLBUTRIN XL) 150 MG 24 hr tablet TAKE 1 TABLET BY MOUTH EVERY DAY IN THE MORNING 09/08/22   [name], FNP  carvedilol (COREG) 3.125 MG tablet Take 2 tablets (6.25 mg total) by mouth 2 (two) times daily. 10/12/22   Donato Heinz, MD  cephALEXin (KEFLEX) 500 MG capsule Take 1 capsule (500 mg total) by mouth 4 (four) times daily for 10 days. 10/11/22 10/21/22  [name], FNP  cetirizine (ZYRTEC) 10 MG tablet Take 10 mg by mouth daily as needed for allergies.    [provider]  clonazePAM (KLONOPIN) 0.5 MG tablet TAKE 1 TABLET BY MOUTH THREE TIMES A DAY AS NEEDED 09/08/22   [name], FNP  clopidogrel (PLAVIX) 75 MG tablet Take 1 tablet (75 mg total) by mouth daily. 02/09/22   Boisseau, Hayley, PA  Dulaglutide (TRULICITY)  1.5 0000000 SOPN INJECT 1.5 MG INTO THE SKIN ONCE A WEEK. 09/07/22   [name], FNP  DULoxetine (CYMBALTA) 60 MG capsule TAKE 1 CAPSULE BY MOUTH EVERY DAY 06/01/22   Jaffe, Adam R, DO  EMGALITY 120 MG/ML SOAJ Inject into the skin. 10/10/22   [provider]  empagliflozin (JARDIANCE) 10 MG TABS tablet Take 1 tablet by mouth once daily in the morning 10/12/22   Gardiner Rhyme,  Doreatha Martin, MD  famotidine (PEPCID) 20 MG tablet Take 20 mg by mouth daily. 11/09/21   [provider]  hydrOXYzine (VISTARIL) 50 MG capsule TAKE 1-2 CAPSULES BY MOUTH AS NEEDED FOR SLEEP AT BEDTIME 09/08/22   [name], FNP  Insulin Pen Needle (PEN NEEDLES) 32G X 4 MM MISC 1 each by Does not apply route once a week. 11/11/18   [name], FNP  linaclotide Rolan Lipa) 72 MCG capsule Take 1 capsule (72 mcg total) by mouth daily before breakfast. 07/25/22   [name], FNP  losartan (COZAAR) 50 MG tablet Take 1 tablet (50 mg total) by mouth daily. 09/08/22   Donato Heinz, MD  ondansetron (ZOFRAN-ODT) 4 MG disintegrating tablet 4 mg daily as needed for nausea or vomiting. 11/09/21   [provider]  phentermine (ADIPEX-P) 37.5 MG tablet Take 1 tablet (37.5 mg total) by mouth daily before breakfast. 09/07/22   [name], FNP  Polyethyl Glycol-Propyl Glycol (SYSTANE) 0.4-0.3 % SOLN Place 1 drop into both eyes daily.    [provider]  tiZANidine (ZANAFLEX) 4 MG tablet Take 1 tablet (4 mg total) by mouth every 8 (eight) hours as needed for muscle spasms. 09/29/22 09/29/23  Jennye Boroughs, MD  topiramate (TOPAMAX) 50 MG tablet TAKE 2 AND 1/2 TABLETS (125 MG TOTAL) BY MOUTH AT BEDTIME. 06/01/22   Jaffe, Adam R, DO  Vitamin D, Ergocalciferol, (DRISDOL) 1.25 MG (50000 UNIT) CAPS capsule TAKE 1 CAPSULE BY MOUTH EVERY 7 DAYS **NOT COVERED** 08/03/22   [name], FNP  metFORMIN (GLUCOPHAGE-XR) 500 MG 24 hr tablet Take 2 tablets (1,000 mg total) by mouth 2 (two) times daily. Patient not taking:  Reported on 06/05/2019 03/10/16 08/15/19  [name], MD  Phentermine-Topiramate 11.25-69 MG CP24 Take 1 tablet by mouth daily. 02/04/19 04/07/19  [name], FNP    Physical Exam: Vitals:   10/13/22 1230 10/13/22 1245 10/13/22 1315 10/13/22 1331  BP: 122/81 117/78 121/82 112/80  Pulse: 80 88 88 87  Resp: 14 11 12 14  $ Temp:    98.6 F (37 C)  TempSrc:    Oral  SpO2: 94% 95% 94% 95%    Constitutional: NAD, calm, comfortable Eyes: PERRL, lids and conjunctivae normal ENMT: Mucous membranes are moist. Posterior pharynx clear of any exudate or lesions.Normal dentition.  Neck: normal, supple, no masses, no thyromegaly Respiratory: clear to auscultation bilaterally, no wheezing, no crackles. Normal respiratory effort. No accessory muscle use.  Cardiovascular: Regular rate and rhythm, no murmurs / rubs / gallops. No extremity edema. 2+ pedal pulses. No carotid bruits.  Abdomen: no tenderness, no masses palpated. No hepatosplenomegaly. Bowel sounds positive.  Musculoskeletal: no clubbing / cyanosis. No joint deformity upper and lower extremities. Good ROM, no contractures. Normal muscle tone.  Neurologic: CN 2-12 grossly intact. Sensation intact, DTR normal. Strength 5/5 in all 4.  Psychiatric: Normal judgment and insight. Alert and oriented x 3. Normal mood.  SKIN/catheters: no rashes, lesions, ulcers. No induration  Labs on Admission: I have personally reviewed following labs and imaging studies  CBC: Recent Labs  Lab 10/12/22 0905 10/13/22 1019 10/13/22 1157  WBC 10.0 9.7  --   NEUTROABS  --  5.4  --   HGB 13.5 13.1 13.6  HCT 40.7 39.1 40.0  MCV 85 85.4  --   PLT 321 291  --    Basic Metabolic Panel: Recent Labs  Lab 10/12/22 0905 10/13/22 1019 10/13/22 1157  NA 132* 128* 133*  K 5.0 4.2 4.2  CL 95* 94*  --   CO2 21 18*  --   GLUCOSE 551* 765*  --   BUN 9 9  --   CREATININE 0.68 0.86  --   CALCIUM 10.2 9.2  --    GFR: Estimated Creatinine Clearance: 99.9 mL/min  (by C-G formula based on SCr of 0.86 mg/dL). Recent Labs  Lab 10/12/22 0905 10/13/22 1019  WBC 10.0 9.7   Liver Function Tests: No results for input(s): "AST", "ALT", "ALKPHOS", "BILITOT", "PROT", "ALBUMIN" in the last 168 hours. No results for input(s): "LIPASE", "AMYLASE" in the last 168 hours. No results for input(s): "AMMONIA" in the last 168 hours. Coagulation Profile: No results for input(s): "INR", "PROTIME" in the last 168 hours. Cardiac Enzymes: No results for input(s): "CKTOTAL", "CKMB", "CKMBINDEX", "TROPONINI" in the last 168 hours. BNP (last 3 results) No results for input(s): "PROBNP" in the last 8760 hours. HbA1C: No results for input(s): "HGBA1C" in the last 72 hours. CBG: Recent Labs  Lab 10/13/22 1008 10/13/22 1244 10/13/22 1323 10/13/22 1412  GLUCAP >600* 360* 172* 126*   Lipid Profile: No results for input(s): "CHOL", "HDL", "LDLCALC", "TRIG", "CHOLHDL", "LDLDIRECT" in the last 72 hours. Thyroid Function Tests: No results for input(s): "TSH", "T4TOTAL", "FREET4", "T3FREE", "THYROIDAB" in the last 72 hours. Anemia Panel: No results for input(s): "VITAMINB12", "FOLATE", "FERRITIN", "TIBC", "IRON", "RETICCTPCT" in the last 72 hours. Urine analysis:    Component Value Date/Time   COLORURINE DARK YELLOW 03/27/2022 0752   APPEARANCEUR CLEAR 03/27/2022 0752   LABSPEC 1.031 03/27/2022 0752   PHURINE < OR = 5.0 03/27/2022 0752   GLUCOSEU NEGATIVE 03/27/2022 0752   HGBUR NEGATIVE 03/27/2022 0752   BILIRUBINUR Small 07/25/2022 1052   KETONESUR NEGATIVE 03/27/2022 0752   PROTEINUR Positive (A) 07/25/2022 1052   PROTEINUR NEGATIVE 03/27/2022 0752   UROBILINOGEN 0.2 07/25/2022 1052   UROBILINOGEN 0.2 07/27/2014 0021   NITRITE Negative 07/25/2022 1052   NITRITE NEGATIVE 03/27/2022 0752   LEUKOCYTESUR Negative 07/25/2022 1052   LEUKOCYTESUR NEGATIVE 03/27/2022 0752    Radiological Exams on Admission: Personally reviewed  DG Chest 2 View  Result Date:  10/13/2022 CLINICAL DATA:  Chest pain EXAM: CHEST - 2 VIEW COMPARISON:  Chest x-ray dated October 12, 2020 FINDINGS: The heart size and mediastinal contours are within normal limits. Bibasilar atelectasis. Both lungs are otherwise clear. The visualized skeletal structures are unremarkable. IMPRESSION: No active cardiopulmonary disease. Electronically Signed   By: Yetta Glassman M.D.   On: 10/13/2022 11:18    EKG: Independently reviewed.  Normal sinus rhythm, QTc 459 ms     Assessment and Plan:   Principal Problem:   DKA (diabetic ketoacidosis) ([place]) Active Problems:   Essential hypertension   Depression   Anxiety   Migraine   Obesity (BMI 30-39.9)   Gastroesophageal reflux disease with esophagitis without hemorrhage   PTSD (post-traumatic stress disorder)   Attention deficit hyperactivity disorder (ADHD), predominantly inattentive type   Vitamin D deficiency   AVM (arteriovenous malformation) brain    1.Mild DKA: Continue insulin drip that was initiated in the ED.  Will transition to subcu insulin once DKA resolves.  Patient is awake alert and oriented.  Okay to eat.  Will consult diabetes educator to ensure correct usage of her glucose monitor and insulin adjustments.  Recheck hemoglobin A1c.  Keep potassium close to 4 while on insulin drip.  Pseudohyponatremia noted.  Should correct with insulin correction.  2.  Chronic combined systolic/diastolic CHF: Patient states she has chronic mild leg edema (  not appreciated on exam today) but does not take any diuretics.  [facility].  Last echo showed EF 30 to 35%.  Resume losartan and increase dose of Coreg at 6.25 twice daily (please see cardiology note).  Unclear indication for aspirin/Plavix-resume for now and follow-up PCP.  3.  Hypertension: Resume home meds as discussed above.  4.  Anxiety, depression, PTSD, ADHD: Resume home meds.  5. Degenerative disc disease, sciatica, migraines: Resume home medications.  No acute issues  currently.  6.  GERD: Resume PPI  7.  Vitamin D deficiency: Resume weekly vitamin D upon discharge.  6.  Obesity with BMI 30-39.9: Follow-up PCP for long-term strategies.  No acute interventions.  DVT prophylaxis: Lovenox  Code Status:   Full code.Health care proxy would be her son  Patient/Family Communication: Discussed with patient and all questions answered to satisfaction.  Consults called: None Admission status :Patient will be admitted under OBSERVATION status.The patient's presenting symptoms, physical exam findings, and initial radiographic and laboratory data in the context of their medical condition is felt to place them at low risk for further clinical deterioration. Furthermore, it is anticipated that the patient will be medically stable for discharge from the hospital within 2 midnights of hospital stay.       Guilford Shi MD [facility] Pager in [place]  If 7PM-7AM, please contact night-coverage [email or web address]   10/13/2022, 2:15 PM

## 2022-10-13 NOTE — Plan of Care (Signed)
  Problem: Education: Goal: Ability to describe self-care measures that may prevent or decrease complications (Diabetes Survival Skills Education) will improve Outcome: Progressing   Problem: Coping: Goal: Ability to adjust to condition or change in health will improve Outcome: Progressing   Problem: Fluid Volume: Goal: Ability to maintain a balanced intake and output will improve Outcome: Progressing   

## 2022-10-13 NOTE — ED Provider Notes (Signed)
Emergency Department Provider Note   I have reviewed the triage vital signs and the nursing notes.   HISTORY  Chief Complaint Hyperglycemia   HPI Virginia Douglas is a 47 y.o. female with past history of anxiety, diabetes, hypertension, and GERD presents to the emergency department for evaluation of elevated blood sugar readings at her cardiologist yesterday.  She had blood work yesterday showing her blood sugar of 551.  She tells me she was encouraged to come to the ED yesterday but tried to manage at home with various home remedies and oral fluids.  This morning, she took her blood sugar and it registered "high" on her meter so she presents to the ED for evaluation.  No nausea/vomiting.  No shortness of breath.  No abdominal pain.  She is compliant with her home medications including Trulicity.  She does not take oral diabetes medication or insulin daily.   On the drive to the emergency department the patient notes that she did have around 6 seconds of left upper chest pressure.  She has chest pain from time to time, similar to this, but mentions this as an associated symptom.  No active chest pain.   Past Medical History:  Diagnosis Date   ADHD (attention deficit hyperactivity disorder)    Anemia    iron deficiency   as a teenager   Anxiety    Arthritis    Diabetes ([place])    Edema    GERD (gastroesophageal reflux disease)    Hypertension    patient denies- patient stated that the PCP said it was for heart prevention.   Migraine headache     Review of Systems  Constitutional: No fever/chills Eyes: No visual changes. ENT: No sore throat. Cardiovascular: Positive chest pain (resolved). Positive lightheadedness.  Respiratory: Denies shortness of breath. Gastrointestinal: No abdominal pain.  No nausea, no vomiting.  No diarrhea.  No constipation. Genitourinary: Negative for dysuria. Musculoskeletal: Negative for back pain. Skin: Negative for rash. Neurological:  Negative for headaches, focal weakness or numbness.  ____________________________________________   PHYSICAL EXAM:  VITAL SIGNS: Vitals:   10/13/22 1030 10/13/22 1045  BP: 105/68 112/72  Pulse: 86 79  Resp: 16 11  SpO2: 96% 95%    Constitutional: Alert and oriented. Well appearing and in no acute distress. Eyes: Conjunctivae are normal.  Head: Atraumatic. Nose: No congestion/rhinnorhea. Mouth/Throat: Mucous membranes are moist.   Neck: No stridor.   Cardiovascular: Normal rate, regular rhythm. Good peripheral circulation. Grossly normal heart sounds.   Respiratory: Normal respiratory effort.  No retractions. Lungs CTAB. Gastrointestinal: Soft and nontender. No distention.  Musculoskeletal: No lower extremity tenderness nor edema. No gross deformities of extremities. Neurologic:  Normal speech and language. No gross focal neurologic deficits are appreciated.  Skin:  Skin is warm, dry and intact. No rash noted.  ____________________________________________   LABS (all labs ordered are listed, but only abnormal results are displayed)  Labs Reviewed  BASIC METABOLIC PANEL - Abnormal; Notable for the following components:      Result Value   Sodium 128 (*)    Chloride 94 (*)    CO2 18 (*)    Glucose, Bld 765 (*)    Anion gap 16 (*)    All other components within normal limits  CBG MONITORING, ED - Abnormal; Notable for the following components:   Glucose-Capillary >600 (*)    All other components within normal limits  CBC WITH DIFFERENTIAL/PLATELET  BETA-HYDROXYBUTYRIC ACID  BETA-HYDROXYBUTYRIC ACID  URINALYSIS, ROUTINE W REFLEX  MICROSCOPIC  CBG MONITORING, ED  I-STAT VENOUS BLOOD GAS, ED  TROPONIN I (HIGH SENSITIVITY)   ____________________________________________  EKG   EKG Interpretation  Date/Time:  Friday October 13 2022 10:09:23 EST Ventricular Rate:  91 PR Interval:  218 QRS Duration: 95 QT Interval:  373 QTC Calculation: 459 R Axis:   35 Text  Interpretation: Sinus rhythm Prolonged PR interval Confirmed by Nanda Quinton [phone]) on 10/13/2022 10:12:58 AM        ____________________________________________  RADIOLOGY  DG Chest 2 View  Result Date: 10/13/2022 CLINICAL DATA:  Chest pain EXAM: CHEST - 2 VIEW COMPARISON:  Chest x-ray dated October 12, 2020 FINDINGS: The heart size and mediastinal contours are within normal limits. Bibasilar atelectasis. Both lungs are otherwise clear. The visualized skeletal structures are unremarkable. IMPRESSION: No active cardiopulmonary disease. Electronically Signed   By: Yetta Glassman M.D.   On: 10/13/2022 11:18    ____________________________________________   PROCEDURES  Procedure(s) performed:   .Critical Care  Performed by: [name], MD Authorized by: [name], MD   Critical care provider statement:    Critical care time (minutes):  35   Critical care time was exclusive of:  Separately billable procedures and treating other patients and teaching time   Critical care was necessary to treat or prevent imminent or life-threatening deterioration of the following conditions:  Endocrine crisis   Critical care was time spent personally by me on the following activities:  Development of treatment plan with patient or surrogate, discussions with consultants, evaluation of patient's response to treatment, examination of patient, ordering and review of laboratory studies, ordering and review of radiographic studies, ordering and performing treatments and interventions, pulse oximetry, re-evaluation of patient's condition, review of old charts, blood draw for specimens and obtaining history from patient or surrogate   I assumed direction of critical care for this patient from another provider in my specialty: no     Care discussed with: admitting provider      ____________________________________________   INITIAL IMPRESSION / [place] / ED COURSE  Pertinent labs &  imaging results that were available during my care of the patient were reviewed by me and considered in my medical decision making (see chart for details).   This patient is Presenting for Evaluation of hyperglycemia, which does require a range of treatment options, and is a complaint that involves a high risk of morbidity and mortality.  The Differential Diagnoses include symptomatic hyperglycemia, DKA, HHS, arrhythmia, ACS, PE, etc.  Critical Interventions-    Medications  insulin regular, human (MYXREDLIN) 100 units/ 100 mL infusion (has no administration in time range)  lactated ringers infusion (has no administration in time range)  dextrose 5 % in lactated ringers infusion (has no administration in time range)  dextrose 50 % solution 0-50 mL (has no administration in time range)  potassium chloride 10 mEq in 100 mL IVPB (has no administration in time range)  lactated ringers bolus 500 mL (0 mLs Intravenous Stopped 10/13/22 1111)    Reassessment after intervention: No worsening symptoms or return of CP.    I decided to review pertinent External Data, and in summary ECHO from 12/23 showing CHF with EF 30-35%.   Clinical Laboratory Tests Ordered, included BMP with hyperglycemia, CO2 18, and anion gap 16 consistent with early DKA. Pseudohyponatremia. Troponin negative.   Radiologic Tests Ordered, included CXR. I independently interpreted the images and agree with radiology interpretation.   Cardiac Monitor Tracing which shows NSR.  Social Determinants of Health Risk not an active smoker.   Consult complete with Hospitalist. Plan for admit.   Medical Decision Making: Summary:  Patient presents emergency department with hyperglycemia and lightheadedness.  Does not appear clinically volume overloaded..  Brief episode of chest pain on the way to the ED this morning but no active pain here.  EKG without acute ischemic change.  Patient does have congestive heart failure and had an  initial appointment with Cardiology yesterday. Plan for labs, small IVF bolus with CHF, and assess for DKA/HHS.   Reevaluation with update and discussion with patient. Discussed labs and plan for insulin infusion and admit. She is in agreement with plan.   Patient's presentation is most consistent with acute presentation with potential threat to life or bodily function.   Disposition: admit  ____________________________________________  FINAL CLINICAL IMPRESSION(S) / ED DIAGNOSES  Final diagnoses:  Diabetic ketoacidosis without coma associated with type 2 diabetes mellitus ([place])    Note:  This document was prepared using Dragon voice recognition software and may include unintentional dictation errors.  Nanda Quinton, MD, [facility]    Audryana Hockenberry, [facility], MD 10/14/22 [phone]

## 2022-10-13 NOTE — ED Notes (Signed)
Date and time results received: 10/13/22 1131 (use smartphrase ".now" to insert current time)  Test: Glucose Critical Value: 765  Name of Provider Notified: Long

## 2022-10-13 NOTE — TOC Benefit Eligibility Note (Signed)
Transition of Care [facility]) Benefit Eligibility Note    Patient Details  Name: Virginia Douglas MRN: [id number] Date of Birth: June 06, 1976   Medication/Dose: NOVOLOG 0.9 UNITS TID  Covered?: Yes  Tier: 2 Drug  Prescription Coverage [facility]  Spoke with Person/Company/Phone Number:: LOUANN @ [facility] [place] # [phone]  Co-Pay: Johnsie [employer]  Prior Approval: No     Additional Notes: SEMGLEE 20 UNITS  QD  : NOT COVER ON PT'S PLAN    Memory Argue Phone Number: 10/13/2022, 5:06 PM

## 2022-10-13 NOTE — ED Triage Notes (Signed)
Pt reports hyperglycemia and dizziness. Pt stating she had lab work yesterday at her cardiologist office and was told her BS was 551.

## 2022-10-13 NOTE — ED Notes (Signed)
ED TO INPATIENT HANDOFF REPORT  ED Nurse Name and Phone #: Darnelle Maffucci [phone] Name/Age/Gender Virginia Douglas 47 y.o. female Room/Bed: 016C/016C  Code Status   Code Status: Full Code  Home/SNF/Other Home Patient oriented to: self, place, time, and situation Is this baseline? Yes   Triage Complete: Triage complete  Chief Complaint DKA (diabetic ketoacidosis) ([place]) [E11.10]  Triage Note Pt reports hyperglycemia and dizziness. Pt stating she had lab work yesterday at her cardiologist office and was told her BS was 551.   Allergies No Known Allergies  Level of Care/Admitting Diagnosis ED Disposition     ED Disposition  Admit   Condition  --   Comment  Hospital Area: [place] [[id number]]  Level of Care: Med-Surg [16]  May place patient in observation at [facility] or [place] if equivalent level of care is available:: Yes  Covid Evaluation: Asymptomatic - no recent exposure (last 10 days) testing not required  Diagnosis: DKA (diabetic ketoacidosis) [facility]) [id number]  Admitting Physician: Guilford Shi [id number]  Attending Physician: Guilford Shi [id number]          B Medical/Surgery History Past Medical History:  Diagnosis Date   ADHD (attention deficit hyperactivity disorder)    Anemia    iron deficiency   as a teenager   Anxiety    Arthritis    Diabetes ([place])    Edema    GERD (gastroesophageal reflux disease)    Hypertension    patient denies- patient stated that the PCP said it was for heart prevention.   Migraine headache    Past Surgical History:  Procedure Laterality Date   ABDOMINAL HYSTERECTOMY  2012   CHOLECYSTECTOMY     COLONOSCOPY  12/28/2020   polyps   ESOPHAGEAL DILATION     gamma knife     04/2022   IR ANGIO INTRA EXTRACRAN SEL COM CAROTID INNOMINATE BILAT MOD SED  05/31/2021   IR ANGIO INTRA EXTRACRAN SEL COM CAROTID INNOMINATE BILAT MOD SED  06/27/2021   IR ANGIO INTRA EXTRACRAN SEL COM CAROTID  INNOMINATE UNI R MOD SED  11/21/2021   IR ANGIO VERTEBRAL SEL VERTEBRAL UNI R MOD SED  05/31/2021   IR CT HEAD LTD  06/27/2021   IR CT HEAD LTD  11/21/2021   IR INTRA CRAN STENT  06/27/2021   IR INTRA CRAN STENT  11/21/2021   IR RADIOLOGIST EVAL & MGMT  06/03/2021   IR RADIOLOGIST EVAL & MGMT  07/13/2021   IR RADIOLOGIST EVAL & MGMT  08/27/2021   IR RADIOLOGIST EVAL & MGMT  10/30/2021   IR RADIOLOGIST EVAL & MGMT  12/06/2021   IR US GUIDE VASC ACCESS RIGHT  05/31/2021   IR US GUIDE VASC ACCESS RIGHT  06/27/2021   IR US GUIDE VASC ACCESS RIGHT  11/21/2021   OVARIAN CYST REMOVAL     RADIOLOGY WITH ANESTHESIA N/A 06/27/2021   Procedure: STENTING;  Surgeon: Luanne Bras, MD;  Location: [place];  Service: Radiology;  Laterality: N/A;   RADIOLOGY WITH ANESTHESIA N/A 11/21/2021   Procedure: IR WITH ANESTHESIA STENTING;  Surgeon: Luanne Bras, MD;  Location: [place];  Service: Radiology;  Laterality: N/A;     A IV Location/Drains/Wounds Patient Lines/Drains/Airways Status     Active Line/Drains/Airways     Name Placement date Placement time Site Days   Peripheral IV 10/13/22 20 G Anterior;Proximal;Right Forearm 10/13/22  1019  Forearm  less than 1   Peripheral IV 10/13/22 22 G Anterior;Left Hand 10/13/22  1156  Hand  less than 1            Intake/Output Last 24 hours  Intake/Output Summary (Last 24 hours) at 10/13/2022 1320 Last data filed at 10/13/2022 1111 Gross per 24 hour  Intake 504.74 ml  Output --  Net 504.74 ml    Labs/Imaging Results for orders placed or performed during the hospital encounter of 10/13/22 (from the past 48 hour(s))  CBG monitoring, ED     Status: Abnormal   Collection Time: 10/13/22 10:08 AM  Result Value Ref Range   Glucose-Capillary >600 (HH) 70 - 99 mg/dL    Comment: Glucose reference range applies only to samples taken after fasting for at least 8 hours.  Basic metabolic panel     Status: Abnormal   Collection Time: 10/13/22 10:19 AM   Result Value Ref Range   Sodium 128 (L) 135 - 145 mmol/L   Potassium 4.2 3.5 - 5.1 mmol/L   Chloride 94 (L) 98 - 111 mmol/L   CO2 18 (L) 22 - 32 mmol/L   Glucose, Bld 765 (HH) 70 - 99 mg/dL    Comment: CRITICAL RESULT CALLED TO, READ BACK BY AND VERIFIED WITH T.Ajai Terhaar RN 1130 10/13/22 MCCORMICK K Glucose reference range applies only to samples taken after fasting for at least 8 hours.    BUN 9 6 - 20 mg/dL   Creatinine, Ser 0.86 0.44 - 1.00 mg/dL   Calcium 9.2 8.9 - 10.3 mg/dL   GFR, Estimated >60 >60 mL/min    Comment: (NOTE) Calculated using the CKD-EPI Creatinine Equation (2021)    Anion gap 16 (H) 5 - 15    Comment: Performed at [facility] [address] [place], [place] [address]  Troponin I (High Sensitivity)     Status: None   Collection Time: 10/13/22 10:19 AM  Result Value Ref Range   Troponin I (High Sensitivity) 4 <18 ng/L    Comment: (NOTE) Elevated high sensitivity troponin I (hsTnI) values and significant  changes across serial measurements may suggest ACS but many other  chronic and acute conditions are known to elevate hsTnI results.  Refer to the "Links" section for chest pain algorithms and additional  guidance. Performed at [facility], [place] [address] [place],  [address]   CBC with Differential     Status: None   Collection Time: 10/13/22 10:19 AM  Result Value Ref Range   WBC 9.7 4.0 - 10.5 K/uL   RBC 4.58 3.87 - 5.11 MIL/uL   Hemoglobin 13.1 12.0 - 15.0 g/dL   HCT 39.1 36.0 - 46.0 %   MCV 85.4 80.0 - 100.0 fL   MCH 28.6 26.0 - 34.0 pg   MCHC 33.5 30.0 - 36.0 g/dL   RDW 14.0 11.5 - 15.5 %   Platelets 291 150 - 400 K/uL   nRBC 0.0 0.0 - 0.2 %   Neutrophils Relative % 56 %   Neutro Abs 5.4 1.7 - 7.7 K/uL   Lymphocytes Relative 35 %   Lymphs Abs 3.4 0.7 - 4.0 K/uL   Monocytes Relative 6 %   Monocytes Absolute 0.6 0.1 - 1.0 K/uL   Eosinophils Relative 2 %   Eosinophils Absolute 0.2 0.0 - 0.5 K/uL   Basophils Relative 1 %    Basophils Absolute 0.1 0.0 - 0.1 K/uL   Immature Granulocytes 0 %   Abs Immature Granulocytes 0.03 0.00 - 0.07 K/uL    Comment: Performed at [facility], [address] [place], [place] [address]  Beta-hydroxybutyric acid     Status: None   Collection Time: 10/13/22 11:50 AM  Result Value Ref Range   Beta-Hydroxybutyric Acid 0.23 0.05 - 0.27 mmol/L    Comment: Performed at [facility], [place] [address] [place], [place] [address]  Troponin I (High Sensitivity)     Status: None   Collection Time: 10/13/22 11:50 AM  Result Value Ref Range   Troponin I (High Sensitivity) 4 <18 ng/L    Comment: (NOTE) Elevated high sensitivity troponin I (hsTnI) values and significant  changes across serial measurements may suggest ACS but many other  chronic and acute conditions are known to elevate hsTnI results.  Refer to the "Links" section for chest pain algorithms and additional  guidance. Performed at [facility], [place] [address] [place],  [address]   I-Stat Venous Blood Gas, ED     Status: Abnormal   Collection Time: 10/13/22 11:57 AM  Result Value Ref Range   pH, Ven 7.439 (H) 7.25 - 7.43   pCO2, Ven 33.6 (L) 44 - 60 mmHg   pO2, Ven 87 (H) 32 - 45 mmHg   Bicarbonate 22.8 20.0 - 28.0 mmol/L   TCO2 24 22 - 32 mmol/L   O2 Saturation 97 %   Acid-base deficit 1.0 0.0 - 2.0 mmol/L   Sodium 133 (L) 135 - 145 mmol/L   Potassium 4.2 3.5 - 5.1 mmol/L   Calcium, Ion 1.19 1.15 - 1.40 mmol/L   HCT 40.0 36.0 - 46.0 %   Hemoglobin 13.6 12.0 - 15.0 g/dL   Sample type VENOUS   CBG monitoring, ED     Status: Abnormal   Collection Time: 10/13/22 12:44 PM  Result Value Ref Range   Glucose-Capillary 360 (H) 70 - 99 mg/dL    Comment: Glucose reference range applies only to samples taken after fasting for at least 8 hours.   DG Chest 2 View  Result Date: 10/13/2022 CLINICAL DATA:  Chest pain EXAM: CHEST - 2 VIEW COMPARISON:  Chest x-ray dated October 12, 2020 FINDINGS: The  heart size and mediastinal contours are within normal limits. Bibasilar atelectasis. Both lungs are otherwise clear. The visualized skeletal structures are unremarkable. IMPRESSION: No active cardiopulmonary disease. Electronically Signed   By: Yetta Glassman M.D.   On: 10/13/2022 11:18    Pending Labs Unresulted Labs (From admission, onward)     Start     Ordered   10/20/22 0500  Creatinine, serum  (enoxaparin (LOVENOX)    CrCl >/= 30 ml/min)  Weekly,   R     Comments: while on enoxaparin therapy    10/13/22 1303   10/13/22 1302  Hemoglobin A1c  (Diabetes Ketoacidosis (DKA))  Once,   R       Comments: To assess prior glycemic control    10/13/22 1303   10/13/22 [id number]  Basic metabolic panel  (Diabetes Ketoacidosis (DKA))  STAT Now then every 4 hours ,   R      10/13/22 1303   10/13/22 1259  CBC  (enoxaparin (LOVENOX)    CrCl >/= 30 ml/min)  Once,   R       Comments: Baseline for enoxaparin therapy IF NOT ALREADY DRAWN.  Notify MD if PLT < 100 K.    10/13/22 1303   10/13/22 1133  Beta-hydroxybutyric acid  (Diabetes Ketoacidosis (DKA))  Now then every 8 hours,   STAT      10/13/22 1134  10/13/22 1133  Urinalysis, Routine w reflex microscopic -Urine, Clean Catch  (Diabetes Ketoacidosis (DKA))  ONCE - STAT,   URGENT       Question:  Specimen Source  Answer:  Urine, Clean Catch   10/13/22 1134            Vitals/Pain Today's Vitals   10/13/22 1200 10/13/22 1215 10/13/22 1230 10/13/22 1245  BP: 122/82 122/84 122/81 117/78  Pulse: 81 79 80 88  Resp: 15 12 14 11  $ SpO2: 94% 94% 94% 95%  PainSc:        Isolation Precautions No active isolations  Medications Medications  insulin regular, human (MYXREDLIN) 100 units/ 100 mL infusion (9 Units/hr Intravenous Rate/Dose Change 10/13/22 1245)  lactated ringers infusion ( Intravenous New Bag/Given 10/13/22 1159)  dextrose 5 % in lactated ringers infusion (has no administration in time range)  dextrose 50 % solution 0-50 mL (has no  administration in time range)  potassium chloride 10 mEq in 100 mL IVPB (10 mEq Intravenous New Bag/Given 10/13/22 1200)  aspirin EC tablet 81 mg (has no administration in time range)  cephALEXin (KEFLEX) capsule 500 mg (has no administration in time range)  acetaZOLAMIDE (DIAMOX) tablet 250 mg (has no administration in time range)  atorvastatin (LIPITOR) tablet 10 mg (has no administration in time range)  carvedilol (COREG) tablet 6.25 mg (has no administration in time range)  losartan (COZAAR) tablet 50 mg (has no administration in time range)  amphetamine-dextroamphetamine (ADDERALL XR) 24 hr capsule 30 mg (has no administration in time range)  buPROPion (WELLBUTRIN XL) 24 hr tablet 150 mg (has no administration in time range)  DULoxetine (CYMBALTA) DR capsule 60 mg (has no administration in time range)  hydrOXYzine (VISTARIL) capsule 50 mg (has no administration in time range)  phentermine (ADIPEX-P) tablet 37.5 mg (has no administration in time range)  empagliflozin (JARDIANCE) tablet 10 mg (has no administration in time range)  famotidine (PEPCID) tablet 20 mg (has no administration in time range)  linaclotide (LINZESS) capsule 72 mcg (has no administration in time range)  ondansetron (ZOFRAN-ODT) disintegrating tablet 4 mg (has no administration in time range)  Boric Acid Vaginal SUPP 1 suppository (has no administration in time range)  clopidogrel (PLAVIX) tablet 75 mg (has no administration in time range)  clonazePAM (KLONOPIN) tablet 0.5 mg (has no administration in time range)  tiZANidine (ZANAFLEX) tablet 4 mg (has no administration in time range)  topiramate (TOPAMAX) tablet 100 mg (has no administration in time range)  Polyethyl Glycol-Propyl Glycol 0.4-0.3 % SOLN 1 drop (has no administration in time range)  enoxaparin (LOVENOX) injection 40 mg (has no administration in time range)  insulin regular, human (MYXREDLIN) 100 units/ 100 mL infusion (has no administration in time  range)  dextrose 50 % solution 0-50 mL (has no administration in time range)  potassium chloride 10 mEq in 100 mL IVPB (has no administration in time range)  lactated ringers bolus 500 mL (0 mLs Intravenous Stopped 10/13/22 1111)    Mobility walks        R Recommendations: See Admitting Provider Note  Report given to:   Additional Notes:

## 2022-10-13 NOTE — Inpatient Diabetes Management (Addendum)
Inpatient Diabetes Program Recommendations  AACE/ADA: New Consensus Statement on Inpatient Glycemic Control (2015)  Target Ranges:  Prepandial:   less than 140 mg/dL      Peak postprandial:   less than 180 mg/dL (1-2 hours)      Critically ill patients:  140 - 180 mg/dL   Lab Results  Component Value Date   GLUCAP 172 (H) 10/13/2022   HGBA1C 6.7 (H) 07/25/2022    Review of Glycemic Control  Latest Reference Range & Units 10/13/22 10:19  CO2 22 - 32 mmol/L 18 (L)  Glucose 70 - 99 mg/dL 765 (HH)  Anion gap 5 - 15  16 (H)  (HH): Data is critically high (L): Data is abnormally low (H): Data is abnormally high  Latest Reference Range & Units 10/13/22 11:50  Beta-Hydroxybutyric Acid 0.05 - 0.27 mmol/L 0.23    Diabetes history: DM2 Outpatient Diabetes medications: Trulicity 1.5 mg weekly, Jardiance 10 mg QD Current orders for Inpatient glycemic control: IV insulin   Inpatient Diabetes Program Recommendations:    When MD is ready to transition, Please consider:  Semglee 20 units QD (0.2 units x 98.2 KG) Novolog 0-9 units TID and 0-5 QHS   Spoke with patient at bedside.  She takes Trulicity at home.  She is current with her PCP.  Has a glucometer at home. She states she has been thirsty and voiding more often.  He CBG at home was > 600 mg/dL.  She drinks caloric beverages and does not follow a DM diet.  Discussed basic patho of DM2.  Current A1C is pending.  Last A1C was 6.7 in November of 2023.    Educated patient on insulin pen use at home. Reviewed contents of insulin flexpen starter kit. Reviewed all steps of insulin pen including attachment of needle, 2-unit air shot, dialing up dose, giving injection, removing needle, disposal of sharps, storage of unused insulin, disposal of insulin etc.  MD to give patient Rxs for insulin pens and insulin pen needles.  She downloaded the [facility] insulin pen administration video from You [employer]ube.  She states the insulin pen administration is  just like Trulicity.    Educated on The Plate Method, CHO's, portion control, CBGs at home fasting and mid afternoon, F/U with PCP every 3 months, bring meter to PCP office, long and short term complications of uncontrolled BG, and importance of exercise.  Discussed hypoglycemia, signs, symptoms and treatments.  She will follow up with her PCP.    Asked pharmacy for a benefit check on insulins and the Freestyle Libre 3 and Dexcom G7.  Ordered LWWD and insulin starter kit.   [email or web address] :  Pharmacy was unable to run a benefit check on this patient because her [place] coverage is not covered by [facility].  Asked TOC for assistance in preferred insulins.    Will continue to follow while inpatient.  Thank you, Reche Dixon, MSN, [place] Diabetes Coordinator Inpatient Diabetes Program [phone] (team pager from 8a-5p)

## 2022-10-13 NOTE — TOC Progression Note (Addendum)
Transition of Care [facility]) - Progression Note    Patient Details  Name: Virginia Douglas MRN: [id number] Date of Birth: 1975-09-15  Transition of Care [facility]) CM/SW Contact  Zenon Mayo, RN Phone Number: 10/13/2022, 4:17 PM  Clinical Narrative:    from home, DKA and CHF, benefit check in process for .Semglee 20 units QD (0.2 units x 98.2 KG) Novolog 0-9 units TID and 0-5 QHS    TOC following.         Expected Discharge Plan and Services                                               Social Determinants of Health (SDOH) Interventions SDOH Screenings   Food Insecurity: No Food Insecurity (10/13/2022)  Housing: Low Risk  (10/13/2022)  Transportation Needs: No Transportation Needs (10/13/2022)  Utilities: Not At Risk (10/13/2022)  Depression (PHQ2-9): Low Risk  (10/11/2022)  Recent Concern: Depression (PHQ2-9) - Medium Risk (09/22/2022)  Tobacco Use: Medium Risk (10/13/2022)    Readmission Risk Interventions     No data to display

## 2022-10-13 NOTE — ED Notes (Signed)
Slowed K infusion to 50/hr because pt reports burning at iv site, no swelling noted, pt reports burning stopped once infusion was slowed, md notified

## 2022-10-14 DIAGNOSIS — E1110 Type 2 diabetes mellitus with ketoacidosis without coma: Secondary | ICD-10-CM | POA: Diagnosis not present

## 2022-10-14 LAB — BASIC METABOLIC PANEL
Anion gap: 11 (ref 5–15)
Anion gap: 11 (ref 5–15)
BUN: 11 mg/dL (ref 6–20)
BUN: 10 mg/dL (ref 6–20)
CO2: 22 mmol/L (ref 22–32)
CO2: 22 mmol/L (ref 22–32)
Calcium: 9 mg/dL (ref 8.9–10.3)
Calcium: 9 mg/dL (ref 8.9–10.3)
Chloride: 104 mmol/L (ref 98–111)
Chloride: 104 mmol/L (ref 98–111)
Creatinine, Ser: 0.66 mg/dL (ref 0.44–1.00)
Creatinine, Ser: 0.6 mg/dL (ref 0.44–1.00)
GFR, Estimated: >60 mL/min (ref >60)
GFR, Estimated: >60 mL/min (ref >60)
Glucose, Bld: 155 mg/dL — ABNORMAL HIGH (ref 70–99)
Glucose, Bld: 127 mg/dL — ABNORMAL HIGH (ref 70–99)
Potassium: 3.5 mmol/L (ref 3.5–5.1)
Potassium: 3.1 mmol/L — ABNORMAL LOW (ref 3.5–5.1)
Sodium: 137 mmol/L (ref 135–145)
Sodium: 137 mmol/L (ref 135–145)

## 2022-10-14 LAB — GLUCOSE, CAPILLARY
Glucose-Capillary: 121 mg/dL — ABNORMAL HIGH (ref 70–99)
Glucose-Capillary: 150 mg/dL — ABNORMAL HIGH (ref 70–99)
Glucose-Capillary: 151 mg/dL — ABNORMAL HIGH (ref 70–99)
Glucose-Capillary: 154 mg/dL — ABNORMAL HIGH (ref 70–99)
Glucose-Capillary: 164 mg/dL — ABNORMAL HIGH (ref 70–99)
Glucose-Capillary: 170 mg/dL — ABNORMAL HIGH (ref 70–99)
Glucose-Capillary: 187 mg/dL — ABNORMAL HIGH (ref 70–99)
Glucose-Capillary: 193 mg/dL — ABNORMAL HIGH (ref 70–99)
Glucose-Capillary: 211 mg/dL — ABNORMAL HIGH (ref 70–99)
Glucose-Capillary: 212 mg/dL — ABNORMAL HIGH (ref 70–99)
Glucose-Capillary: 223 mg/dL — ABNORMAL HIGH (ref 70–99)
Glucose-Capillary: 229 mg/dL — ABNORMAL HIGH (ref 70–99)
Glucose-Capillary: 261 mg/dL — ABNORMAL HIGH (ref 70–99)
Glucose-Capillary: 86 mg/dL (ref 70–99)

## 2022-10-14 LAB — BETA-HYDROXYBUTYRIC ACID: Beta-Hydroxybutyric Acid: 0.13 mmol/L (ref 0.05–0.27)

## 2022-10-14 MED ORDER — INSULIN GLARGINE-YFGN 100 UNIT/ML ~~LOC~~ SOLN
20.0000 [IU] | Freq: Every day | SUBCUTANEOUS | Status: DC
  Administered 2022-10-14: 20 [IU] via SUBCUTANEOUS
  Filled 2022-10-14 (×2): qty 0.2

## 2022-10-14 MED ORDER — INSULIN ASPART 100 UNIT/ML IJ SOLN
0.0000 [IU] | Freq: Every day | INTRAMUSCULAR | Status: DC
  Administered 2022-10-14: 2 [IU] via SUBCUTANEOUS

## 2022-10-14 MED ORDER — POTASSIUM CHLORIDE CRYS ER 20 MEQ PO TBCR
40.0000 meq | EXTENDED_RELEASE_TABLET | Freq: Once | ORAL | Status: AC
  Administered 2022-10-14: 40 meq via ORAL
  Filled 2022-10-14: qty 2

## 2022-10-14 MED ORDER — INSULIN ASPART 100 UNIT/ML IJ SOLN
0.0000 [IU] | Freq: Three times a day (TID) | INTRAMUSCULAR | Status: DC
  Administered 2022-10-14: 5 [IU] via SUBCUTANEOUS
  Administered 2022-10-14: 2 [IU] via SUBCUTANEOUS
  Administered 2022-10-15: 3 [IU] via SUBCUTANEOUS

## 2022-10-14 NOTE — Progress Notes (Signed)
PROGRESS NOTE    Virginia Douglas  [id number] DOB: Mar 01, 1976 DOA: 10/13/2022 PCP: [name], FNP    Brief Narrative:  Virginia Douglas is a 47 y.o. female with history h/o hypertension, GERD, DM insulin-dependent at baseline, ADHD, systolic cardiomyopathy with EF 30-35% presents with complaints of abnormal labs.  Patient apparently had cardiology appointment yesterday and reported feeling weak and tired.  She has also been experiencing polydipsia and loss of appetite with poor oral intake.  Labs were sent which showed elevated blood glucose in 500s.  She was advised to come to the ED for further evaluation but patient chose not to. Overnight she felt worse and came this morning to the ED for further management.   Assessment and Plan: Mild DKA:  -transitioned to SQ insulin and SSI -will need to go home on this regimen. -plan for the AM if BS stable    Chronic combined systolic/diastolic CHF: Patient states she has chronic mild leg edema (not appreciated on exam today) but does not take any diuretics.  Gray cardiology.  Last echo showed EF 30 to 35%.  Resume losartan and increase dose of Coreg at 6.25 twice daily (please see cardiology note).  Unclear indication for aspirin/Plavix-resume for now and follow-up PCP.   Hypertension: Resume home meds as discussed above.   Anxiety, depression, PTSD, ADHD: Resume home meds.   Degenerative disc disease, sciatica, migraines: Resume home medications.  No acute issues currently.   GERD:  -Resume PPI   Vitamin D deficiency:  -Resume weekly vitamin D upon discharge.   Obesity with BMI 30-39.9:  -Follow-up PCP for long-term strategies.  No acute interventions.   Hypokalemia -replete   DVT prophylaxis:     Code Status: Full Code   Disposition Plan:  Level of care: Progressive Status is: Observation The patient remains OBS appropriate and will d/c before 2 midnights.    Consultants:    Subjective: Eating  well, thinks she has done SSI in the past  Objective: Vitals:   10/14/22 0420 10/14/22 0421 10/14/22 0422 10/14/22 0735  BP:    (!) 98/57  Pulse:  78 79 79  Resp: 16   20  Temp: 97.8 F (36.6 C)   98.2 F (36.8 C)  TempSrc: Oral   Oral  SpO2:  96% 96% 94%  Weight: 100.8 kg     Height:        Intake/Output Summary (Last 24 hours) at 10/14/2022 1152 Last data filed at 10/14/2022 0300 Gross per 24 hour  Intake 1057.15 ml  Output 0 ml  Net 1057.15 ml   Filed Weights   10/13/22 1411 10/14/22 0420  Weight: 98.2 kg 100.8 kg    Examination:   General: Appearance:    Obese female in no acute distress     Lungs:     respirations unlabored  Heart:    Normal heart rate. Normal rhythm. No murmurs, rubs, or gallops.    MS:   All extremities are intact.    Neurologic:   Awake, alert, oriented x 3. No apparent focal neurological           defect.        Data Reviewed: I have personally reviewed following labs and imaging studies  CBC: Recent Labs  Lab 10/12/22 0905 10/13/22 1019 10/13/22 1150 10/13/22 1157  WBC 10.0 9.7 10.9*  --   NEUTROABS  --  5.4  --   --   HGB 13.5 13.1 13.4 13.6  HCT 40.7 39.1 39.2  40.0  MCV 85 85.4 83.1  --   PLT 321 291 308  --    Basic Metabolic Panel: Recent Labs  Lab 10/13/22 1150 10/13/22 1157 10/13/22 1825 10/13/22 2048 10/14/22 0041 10/14/22 0444  NA 132* 133* 137 137 137 137  K 4.2 4.2 3.6 4.2 3.5 3.1*  CL 97*  --  103 105 104 104  CO2 20*  --  22 21* 22 22  GLUCOSE 519*  --  256* 198* 155* 127*  BUN 8  --  10 10 11 10  $ CREATININE 0.74  --  0.70 0.68 0.66 0.60  CALCIUM 9.6  --  9.3 9.0 9.0 9.0   GFR: Estimated Creatinine Clearance: 108.4 mL/min (by C-G formula based on SCr of 0.6 mg/dL). Liver Function Tests: No results for input(s): "AST", "ALT", "ALKPHOS", "BILITOT", "PROT", "ALBUMIN" in the last 168 hours. No results for input(s): "LIPASE", "AMYLASE" in the last 168 hours. No results for input(s): "AMMONIA" in the  last 168 hours. Coagulation Profile: No results for input(s): "INR", "PROTIME" in the last 168 hours. Cardiac Enzymes: No results for input(s): "CKTOTAL", "CKMB", "CKMBINDEX", "TROPONINI" in the last 168 hours. BNP (last 3 results) No results for input(s): "PROBNP" in the last 8760 hours. HbA1C: Recent Labs    10/13/22 1150  HGBA1C 11.4*   CBG: Recent Labs  Lab 10/14/22 0733 10/14/22 0833 10/14/22 0949 10/14/22 1051 10/14/22 1148  GLUCAP 187* 211* 193* 86 151*   Lipid Profile: No results for input(s): "CHOL", "HDL", "LDLCALC", "TRIG", "CHOLHDL", "LDLDIRECT" in the last 72 hours. Thyroid Function Tests: No results for input(s): "TSH", "T4TOTAL", "FREET4", "T3FREE", "THYROIDAB" in the last 72 hours. Anemia Panel: No results for input(s): "VITAMINB12", "FOLATE", "FERRITIN", "TIBC", "IRON", "RETICCTPCT" in the last 72 hours. Sepsis Labs: No results for input(s): "PROCALCITON", "LATICACIDVEN" in the last 168 hours.  No results found for this or any previous visit (from the past 240 hour(s)).       Radiology Studies: DG Chest 2 View  Result Date: 10/13/2022 CLINICAL DATA:  Chest pain EXAM: CHEST - 2 VIEW COMPARISON:  Chest x-ray dated October 12, 2020 FINDINGS: The heart size and mediastinal contours are within normal limits. Bibasilar atelectasis. Both lungs are otherwise clear. The visualized skeletal structures are unremarkable. IMPRESSION: No active cardiopulmonary disease. Electronically Signed   By: Yetta Glassman M.D.   On: 10/13/2022 11:18        Scheduled Meds:  amphetamine-dextroamphetamine  30 mg Oral Daily   aspirin EC  81 mg Oral Daily   atorvastatin  10 mg Oral Daily   buPROPion  150 mg Oral Daily   carvedilol  6.25 mg Oral BID WC   cephALEXin  500 mg Oral QID   clopidogrel  75 mg Oral Daily   DULoxetine  60 mg Oral Daily   empagliflozin  10 mg Oral Daily   enoxaparin (LOVENOX) injection  0.5 mg/kg Subcutaneous Q24H   famotidine  20 mg Oral Daily    insulin aspart  0-5 Units Subcutaneous QHS   insulin aspart  0-9 Units Subcutaneous TID WC   insulin glargine-yfgn  20 Units Subcutaneous Daily   linaclotide  72 mcg Oral QAC breakfast   losartan  50 mg Oral Daily   polyvinyl alcohol  1 drop Both Eyes Daily   topiramate  100 mg Oral Daily   Continuous Infusions:   LOS: 0 days    Time spent: 45 minutes spent on chart review, discussion with nursing staff, consultants, updating family and interview/physical exam;  more than 50% of that time was spent in counseling and/or coordination of care.    Geradine Girt, DO [facility] Available via Epic secure chat 7am-7pm After these hours, please refer to coverage provider listed on [email or web address] 10/14/2022, 11:52 AM

## 2022-10-14 NOTE — Progress Notes (Signed)
Inpatient Diabetes Program Recommendations  AACE/ADA: New Consensus Statement on Inpatient Glycemic Control (2015)  Target Ranges:  Prepandial:   less than 140 mg/dL      Peak postprandial:   less than 180 mg/dL (1-2 hours)      Critically ill patients:  140 - 180 mg/dL   Lab Results  Component Value Date   GLUCAP 151 (H) 10/14/2022   HGBA1C 11.4 (H) 10/13/2022    Review of Glycemic Control  Latest Reference Range & Units 10/14/22 07:33 10/14/22 08:33 10/14/22 09:49 10/14/22 10:51 10/14/22 11:48  Glucose-Capillary 70 - 99 mg/dL 187 (H) 211 (H) 193 (H) 86 151 (H)  (H): Data is abnormally high  Diabetes history: DM2 Outpatient Diabetes medications: Trulicity 1.5 mg weekly, Jardiance 10 mg QD Current orders for Inpatient glycemic control: Novolog sensitive tid with meals and HS Jardiance 10 mg daily Semglee 20 units daily  Inpatient Diabetes Program Recommendations:    Patient transitioned off insulin drip this morning.  She was taught how to use insulin pen yesterday.  Per MD it appears that Lantus is preferred basal insulin (per [facility]).  Called and spoke to patient briefly.  She states she is feeling better.  At this time she did not have any questions.  Will follow.   Thanks,  Adah Perl, RN, BC-ADM Inpatient Diabetes Coordinator Pager [phone]  (8a-5p)

## 2022-10-15 DIAGNOSIS — E1110 Type 2 diabetes mellitus with ketoacidosis without coma: Secondary | ICD-10-CM | POA: Diagnosis not present

## 2022-10-15 LAB — BASIC METABOLIC PANEL
Anion gap: 9 (ref 5–15)
BUN: 8 mg/dL (ref 6–20)
CO2: 19 mmol/L — ABNORMAL LOW (ref 22–32)
Calcium: 9 mg/dL (ref 8.9–10.3)
Chloride: 108 mmol/L (ref 98–111)
Creatinine, Ser: 0.65 mg/dL (ref 0.44–1.00)
GFR, Estimated: >60 mL/min (ref >60)
Glucose, Bld: 213 mg/dL — ABNORMAL HIGH (ref 70–99)
Potassium: 3.9 mmol/L (ref 3.5–5.1)
Sodium: 136 mmol/L (ref 135–145)

## 2022-10-15 LAB — GLUCOSE, CAPILLARY: Glucose-Capillary: 224 mg/dL — ABNORMAL HIGH (ref 70–99)

## 2022-10-15 MED ORDER — INSULIN GLARGINE-YFGN 100 UNIT/ML ~~LOC~~ SOLN
23.0000 [IU] | Freq: Every day | SUBCUTANEOUS | Status: DC
  Administered 2022-10-15: 23 [IU] via SUBCUTANEOUS
  Filled 2022-10-15 (×2): qty 0.23

## 2022-10-15 MED ORDER — INSULIN GLARGINE 100 UNIT/ML SOLOSTAR PEN: 23.0000 [IU] | PEN_INJECTOR | Freq: Every day | SUBCUTANEOUS | 1 refills | Status: DC

## 2022-10-15 NOTE — Discharge Summary (Signed)
Physician Discharge Summary  Virginia Douglas [id number] DOB: 16-May-1976 DOA: 10/13/2022  PCP: [name], FNP  Admit date: 10/13/2022 Discharge date: 10/15/2022  Admitted From: home Discharge disposition: home   Recommendations for Outpatient Follow-Up:   Follow blood sugars and adjust insulin   Discharge Diagnosis:   Principal Problem:   DKA (diabetic ketoacidosis) ([facility]) Active Problems:   Essential hypertension   Depression   Anxiety   Migraine   Obesity (BMI 30-39.9)   Gastroesophageal reflux disease with esophagitis without hemorrhage   PTSD (post-traumatic stress disorder)   Attention deficit hyperactivity disorder (ADHD), predominantly inattentive type   Vitamin D deficiency   AVM (arteriovenous malformation) brain    Discharge Condition: Improved.  Diet recommendation: Low sodium, heart healthy.  Carbohydrate-modified  Wound care: None.  Code status: Full.   History of Present Illness:   Virginia Douglas is a 47 y.o. female with history h/o hypertension, GERD, DM insulin-dependent at baseline, ADHD, systolic cardiomyopathy with EF 30-35% presents with complaints of abnormal labs.  Patient apparently had cardiology appointment yesterday and reported feeling weak and tired.  She has also been experiencing polydipsia and loss of appetite with poor oral intake.  Labs were sent which showed elevated blood glucose in 500s.  She was advised to come to the ED for further evaluation but patient chose not to. Overnight she felt worse and came this morning to the ED for further management. ED course: Afebrile, normotensive, pulse 90s, O2 sat 97% on room air.  Blood glucose here was 765.  WBC 9.7, hemoglobin 13, sodium 128, bicarb 18, anion gap 16.  Troponin 4-4.  Patient started on Endo tool for mild DKA and requested admission for further evaluation and management.  She is accompanied by her friend, Roselyn Reef when seen in ED.  Patient awake alert  oriented x 3 and able to participate in history and physical exam.  She denies any dysuria but does report polyuria as discussed above.  She states until 2 weeks back her blood glucose was well-controlled and consistently less than 200.  She states she had a problem with her old blood glucose monitor and had just acquired a new monitor yesterday.  She denies any dietary changes and in fact reports feeling fatigued with no appetite.  She states she was surprised that her blood glucose was so elevated.  She has never had DKA in the past.  Her last hemoglobin A1c was 6.7.  Her only medication has been Trulicity for DKA for more than a year now.  She was just started on Jardiance yesterday by cardiologist.     Hospital Course by Problem:   Mild DKA:  -transitioned to SQ insulin  -does not tolerate metformin -will need to go home on this regimen.   Chronic combined systolic/diastolic CHF: Patient states she has chronic mild leg edema (not appreciated on exam today) but does not take any diuretics.  [place] [facility].  Last echo showed EF 30 to 35%.  Resume losartan and increase dose of Coreg at 6.25 twice daily (please see cardiology note).  Unclear indication for aspirin/Plavix-resume for now and follow-up PCP.   Hypertension: Resume home meds as discussed above.   Anxiety, depression, PTSD, ADHD: Resume home meds.   Degenerative disc disease, sciatica, migraines: Resume home medications.  No acute issues currently.   GERD:  -Resume PPI   Vitamin D deficiency:  -Resume weekly vitamin D upon discharge.   Obesity with BMI 30-39.9:  -Follow-up PCP for  long-term strategies.  No acute interventions.   Hypokalemia -replete    Medical Consultants:      Discharge Exam:   Vitals:   10/15/22 0630 10/15/22 0730  BP: 113/66 116/74  Pulse: 75 81  Resp: 18   Temp: 98.5 F (36.9 C)   SpO2: 98%    Vitals:   10/14/22 1520 10/14/22 1956 10/15/22 0630 10/15/22 0730  BP: 122/64 112/61  113/66 116/74  Pulse: 80 79 75 81  Resp: 18 18 18   $ Temp: 98.3 F (36.8 C) 98.5 F (36.9 C) 98.5 F (36.9 C)   TempSrc: Oral Oral Oral   SpO2: 95% 97% 98%   Weight:   99.4 kg   Height:        General exam: Appears calm and comfortable.     The results of significant diagnostics from this hospitalization (including imaging, microbiology, ancillary and laboratory) are listed below for reference.     Procedures and Diagnostic Studies:   DG Chest 2 View  Result Date: 10/13/2022 CLINICAL DATA:  Chest pain EXAM: CHEST - 2 VIEW COMPARISON:  Chest x-ray dated October 12, 2020 FINDINGS: The heart size and mediastinal contours are within normal limits. Bibasilar atelectasis. Both lungs are otherwise clear. The visualized skeletal structures are unremarkable. IMPRESSION: No active cardiopulmonary disease. Electronically Signed   By: Yetta Glassman M.D.   On: 10/13/2022 11:18     Labs:   Basic Metabolic Panel: Recent Labs  Lab 10/13/22 1825 10/13/22 2048 10/14/22 0041 10/14/22 0444 10/15/22 0046  NA 137 137 137 137 136  K 3.6 4.2 3.5 3.1* 3.9  CL 103 105 104 104 108  CO2 22 21* 22 22 19*  GLUCOSE 256* 198* 155* 127* 213*  BUN 10 10 11 10 8  $ CREATININE 0.70 0.68 0.66 0.60 0.65  CALCIUM 9.3 9.0 9.0 9.0 9.0   GFR Estimated Creatinine Clearance: 107.5 mL/min (by C-G formula based on SCr of 0.65 mg/dL). Liver Function Tests: No results for input(s): "AST", "ALT", "ALKPHOS", "BILITOT", "PROT", "ALBUMIN" in the last 168 hours. No results for input(s): "LIPASE", "AMYLASE" in the last 168 hours. No results for input(s): "AMMONIA" in the last 168 hours. Coagulation profile No results for input(s): "INR", "PROTIME" in the last 168 hours.  CBC: Recent Labs  Lab 10/12/22 0905 10/13/22 1019 10/13/22 1150 10/13/22 1157  WBC 10.0 9.7 10.9*  --   NEUTROABS  --  5.4  --   --   HGB 13.5 13.1 13.4 13.6  HCT 40.7 39.1 39.2 40.0  MCV 85 85.4 83.1  --   PLT 321 291 308  --     Cardiac Enzymes: No results for input(s): "CKTOTAL", "CKMB", "CKMBINDEX", "TROPONINI" in the last 168 hours. BNP: Invalid input(s): "POCBNP" CBG: Recent Labs  Lab 10/14/22 1051 10/14/22 1148 10/14/22 1512 10/14/22 2053 10/15/22 0623  GLUCAP 86 151* 261* 229* 224*   D-Dimer No results for input(s): "DDIMER" in the last 72 hours. Hgb A1c Recent Labs    10/13/22 1150  HGBA1C 11.4*   Lipid Profile No results for input(s): "CHOL", "HDL", "LDLCALC", "TRIG", "CHOLHDL", "LDLDIRECT" in the last 72 hours. Thyroid function studies No results for input(s): "TSH", "T4TOTAL", "T3FREE", "THYROIDAB" in the last 72 hours.  Invalid input(s): "FREET3" Anemia work up No results for input(s): "VITAMINB12", "FOLATE", "FERRITIN", "TIBC", "IRON", "RETICCTPCT" in the last 72 hours. Microbiology No results found for this or any previous visit (from the past 240 hour(s)).   Discharge Instructions:   Discharge Instructions  Diet Carb Modified   Complete by: As directed    Increase activity slowly   Complete by: As directed       Allergies as of 10/15/2022   No Known Allergies      Medication List     STOP taking these medications    AZO Boric Acid 600 MG Supp Generic drug: Boric Acid Vaginal       TAKE these medications    acetaZOLAMIDE 250 MG tablet Commonly known as: DIAMOX TAKE 1 TABLET BY MOUTH TWICE A DAY   Adderall XR 30 MG 24 hr capsule Generic drug: amphetamine-dextroamphetamine Take 30 mg by mouth daily.   aspirin EC 81 MG tablet Take 81 mg by mouth daily. Swallow whole.   atorvastatin 10 MG tablet Commonly known as: LIPITOR TAKE 1 TABLET BY MOUTH EVERY DAY   buPROPion 150 MG 24 hr tablet Commonly known as: WELLBUTRIN XL TAKE 1 TABLET BY MOUTH EVERY DAY IN THE MORNING What changed: See the new instructions.   carvedilol 3.125 MG tablet Commonly known as: COREG Take 2 tablets (6.25 mg total) by mouth 2 (two) times daily.   cephALEXin 500 MG  capsule Commonly known as: KEFLEX Take 1 capsule (500 mg total) by mouth 4 (four) times daily for 10 days.   cetirizine 10 MG tablet Commonly known as: ZYRTEC Take 10 mg by mouth daily as needed for allergies.   clonazePAM 0.5 MG tablet Commonly known as: KLONOPIN TAKE 1 TABLET BY MOUTH THREE TIMES A DAY AS NEEDED What changed:  reasons to take this additional instructions   clopidogrel 75 MG tablet Commonly known as: Plavix Take 1 tablet (75 mg total) by mouth daily.   DULoxetine 60 MG capsule Commonly known as: CYMBALTA TAKE 1 CAPSULE BY MOUTH EVERY DAY What changed: how much to take   empagliflozin 10 MG Tabs tablet Commonly known as: Jardiance Take 1 tablet by mouth once daily in the morning   famotidine 20 MG tablet Commonly known as: PEPCID Take 20 mg by mouth daily.   hydrOXYzine 50 MG capsule Commonly known as: VISTARIL TAKE 1-2 CAPSULES BY MOUTH AS NEEDED FOR SLEEP AT BEDTIME What changed: See the new instructions.   insulin glargine 100 UNIT/ML Solostar Pen Commonly known as: LANTUS Inject 23 Units into the skin daily.   linaclotide 72 MCG capsule Commonly known as: LINZESS Take 1 capsule (72 mcg total) by mouth daily before breakfast.   losartan 50 MG tablet Commonly known as: COZAAR Take 1 tablet (50 mg total) by mouth daily.   Pen Needles 32G X 4 MM Misc 1 each by Does not apply route once a week.   phentermine 37.5 MG tablet Commonly known as: ADIPEX-P Take 1 tablet (37.5 mg total) by mouth daily before breakfast.   Systane 0.4-0.3 % Soln Generic drug: Polyethyl Glycol-Propyl Glycol Place 1 drop into both eyes daily as needed (dry eyes).   tiZANidine 4 MG tablet Commonly known as: Zanaflex Take 1 tablet (4 mg total) by mouth every 8 (eight) hours as needed for muscle spasms.   topiramate 50 MG tablet Commonly known as: TOPAMAX TAKE 2 AND 1/2 TABLETS (125 MG TOTAL) BY MOUTH AT BEDTIME. What changed: See the new instructions.    Trulicity 1.5 0000000 Sopn Generic drug: Dulaglutide INJECT 1.5 MG INTO THE SKIN ONCE A WEEK.   Vitamin D (Ergocalciferol) 1.25 MG (50000 UNIT) Caps capsule Commonly known as: DRISDOL TAKE 1 CAPSULE BY MOUTH EVERY 7 DAYS **NOT COVERED**  Time coordinating discharge: 45 min  Signed:  Geradine Girt DO  [facility] 10/15/2022, 8:36 AM

## 2022-10-15 NOTE — Plan of Care (Signed)
Problem: Education: Goal: Ability to describe self-care measures that may prevent or decrease complications (Diabetes Survival Skills Education) will improve Outcome: Adequate for Discharge Goal: Individualized Educational Video(s) Outcome: Adequate for Discharge   Problem: Coping: Goal: Ability to adjust to condition or change in health will improve Outcome: Adequate for Discharge   Problem: Fluid Volume: Goal: Ability to maintain a balanced intake and output will improve Outcome: Adequate for Discharge   Problem: Health Behavior/Discharge Planning: Goal: Ability to identify and utilize available resources and services will improve Outcome: Adequate for Discharge Goal: Ability to manage health-related needs will improve Outcome: Adequate for Discharge   Problem: Metabolic: Goal: Ability to maintain appropriate glucose levels will improve Outcome: Adequate for Discharge   Problem: Nutritional: Goal: Maintenance of adequate nutrition will improve Outcome: Adequate for Discharge Goal: Progress toward achieving an optimal weight will improve Outcome: Adequate for Discharge   Problem: Skin Integrity: Goal: Risk for impaired skin integrity will decrease Outcome: Adequate for Discharge   Problem: Tissue Perfusion: Goal: Adequacy of tissue perfusion will improve Outcome: Adequate for Discharge   Problem: Education: Goal: Ability to describe self-care measures that may prevent or decrease complications (Diabetes Survival Skills Education) will improve Outcome: Adequate for Discharge Goal: Individualized Educational Video(s) Outcome: Adequate for Discharge   Problem: Cardiac: Goal: Ability to maintain an adequate cardiac output will improve Outcome: Adequate for Discharge   Problem: Health Behavior/Discharge Planning: Goal: Ability to identify and utilize available resources and services will improve Outcome: Adequate for Discharge Goal: Ability to manage health-related  needs will improve Outcome: Adequate for Discharge   Problem: Fluid Volume: Goal: Ability to achieve a balanced intake and output will improve Outcome: Adequate for Discharge   Problem: Metabolic: Goal: Ability to maintain appropriate glucose levels will improve Outcome: Adequate for Discharge   Problem: Nutritional: Goal: Maintenance of adequate nutrition will improve Outcome: Adequate for Discharge Goal: Maintenance of adequate weight for body size and type will improve Outcome: Adequate for Discharge   Problem: Respiratory: Goal: Will regain and/or maintain adequate ventilation Outcome: Adequate for Discharge   Problem: Urinary Elimination: Goal: Ability to achieve and maintain adequate renal perfusion and functioning will improve Outcome: Adequate for Discharge   Problem: Education: Goal: Knowledge of General Education information will improve Description: Including pain rating scale, medication(s)/side effects and non-pharmacologic comfort measures Outcome: Adequate for Discharge   Problem: Health Behavior/Discharge Planning: Goal: Ability to manage health-related needs will improve Outcome: Adequate for Discharge   Problem: Clinical Measurements: Goal: Ability to maintain clinical measurements within normal limits will improve Outcome: Adequate for Discharge Goal: Will remain free from infection Outcome: Adequate for Discharge Goal: Diagnostic test results will improve Outcome: Adequate for Discharge Goal: Respiratory complications will improve Outcome: Adequate for Discharge Goal: Cardiovascular complication will be avoided Outcome: Adequate for Discharge   Problem: Activity: Goal: Risk for activity intolerance will decrease Outcome: Adequate for Discharge   Problem: Nutrition: Goal: Adequate nutrition will be maintained Outcome: Adequate for Discharge   Problem: Coping: Goal: Level of anxiety will decrease Outcome: Adequate for Discharge   Problem:  Elimination: Goal: Will not experience complications related to bowel motility Outcome: Adequate for Discharge Goal: Will not experience complications related to urinary retention Outcome: Adequate for Discharge   Problem: Pain Managment: Goal: General experience of comfort will improve Outcome: Adequate for Discharge   Problem: Safety: Goal: Ability to remain free from injury will improve Outcome: Adequate for Discharge   Problem: Skin Integrity: Goal: Risk for impaired skin integrity will decrease  Outcome: Adequate for Discharge

## 2022-10-16 ENCOUNTER — Ambulatory Visit (HOSPITAL_COMMUNITY)
Admission: RE | Admit: 2022-10-16 | Discharge: 2022-10-16 | Disposition: A | Discharge status: Home / Self Care | Payer: No Typology Code available for payment source | Source: Ambulatory Visit | Attending: Interventional Radiology | Admitting: Interventional Radiology

## 2022-10-16 ENCOUNTER — Telehealth: Payer: Self-pay

## 2022-10-16 DIAGNOSIS — I771 Stricture of artery: Secondary | ICD-10-CM | POA: Diagnosis present

## 2022-10-16 MED ORDER — GADOBUTROL 1 MMOL/ML IV SOLN
10.0000 mL | Freq: Once | INTRAVENOUS | Status: AC | PRN
  Administered 2022-10-16: 10 mL via INTRAVENOUS

## 2022-10-16 NOTE — Transitions of Care (Post Inpatient/ED Visit) (Signed)
   10/16/2022  Name: Virginia Douglas MRN: [id number] DOB: 1975-10-29  Today's TOC FU Call Status: Today's TOC FU Call Status:: Successful TOC FU Call Competed TOC FU Call Complete Date: 10/16/22  Transition Care Management Follow-up Telephone Call Date of Discharge: 10/15/22 Discharge Facility[facility] Type of Discharge: Inpatient Admission Primary Inpatient Discharge Diagnosis:: DKA How have you been since you were released from the hospital?: Better Any questions or concerns?: No  Items Reviewed: Did you receive and understand the discharge instructions provided?: Yes Medications obtained and verified?: Yes (Medications Reviewed) Any new allergies since your discharge?: No Dietary orders reviewed?: Yes Do you have support at home?: Yes  Home Care and Equipment/Supplies: [place] Ordered?: NA Any new equipment or medical supplies ordered?: NA  Functional Questionnaire: Do you need assistance with bathing/showering or dressing?: No Do you need assistance with meal preparation?: No Do you need assistance with eating?: No Do you have difficulty maintaining continence: No Do you need assistance with getting out of bed/getting out of a chair/moving?: No Do you have difficulty managing or taking your medications?: No  Folllow up appointments reviewed: PCP Follow-up appointment confirmed?: Yes Date of PCP follow-up appointment?: 10/19/22 Follow-up Provider: [name] [facility] Follow-up appointment confirmed?: No Do you need transportation to your follow-up appointment?: No Do you understand care options if your condition(s) worsen?: Yes-patient verbalized understanding    [place] LPN [place] Direct Dial [phone]

## 2022-10-17 ENCOUNTER — Telehealth (HOSPITAL_COMMUNITY): Payer: Self-pay | Admitting: Emergency Medicine

## 2022-10-17 NOTE — Telephone Encounter (Signed)
Reaching out to patient to offer assistance regarding upcoming cardiac imaging study; pt verbalizes understanding of appt date/time, parking situation and where to check in, pre-test NPO status and medications ordered, and verified current allergies; name and call back number provided for further questions should they arise Virginia Bond RN Navigator Cardiac Imaging [facility] [phone] office [phone] cell  Arrival [address] Denies iv issues Denies claustro Denies metal implants

## 2022-10-18 ENCOUNTER — Encounter: Payer: Self-pay | Admitting: Nurse Practitioner

## 2022-10-18 ENCOUNTER — Telehealth (HOSPITAL_COMMUNITY): Payer: Self-pay

## 2022-10-18 ENCOUNTER — Ambulatory Visit
Admission: RE | Admit: 2022-10-18 | Discharge: 2022-10-18 | Disposition: A | Discharge status: Home / Self Care | Payer: No Typology Code available for payment source | Source: Ambulatory Visit | Attending: Cardiology | Admitting: Cardiology

## 2022-10-18 ENCOUNTER — Other Ambulatory Visit: Payer: Self-pay | Admitting: Cardiology

## 2022-10-18 DIAGNOSIS — R0609 Other forms of dyspnea: Secondary | ICD-10-CM | POA: Diagnosis not present

## 2022-10-18 DIAGNOSIS — I5042 Chronic combined systolic (congestive) and diastolic (congestive) heart failure: Secondary | ICD-10-CM

## 2022-10-18 MED ORDER — GADOBUTROL 1 MMOL/ML IV SOLN
13.0000 mL | Freq: Once | INTRAVENOUS | Status: AC | PRN
  Administered 2022-10-18: 13 mL via INTRAVENOUS

## 2022-10-18 NOTE — Telephone Encounter (Signed)
Pt agreed to f/u in 6 months with an mrv. AB

## 2022-10-19 ENCOUNTER — Inpatient Hospital Stay: Payer: No Typology Code available for payment source | Admitting: Nurse Practitioner

## 2022-10-19 ENCOUNTER — Encounter: Payer: Self-pay | Admitting: Cardiology

## 2022-10-25 ENCOUNTER — Telehealth: Payer: Self-pay

## 2022-10-25 ENCOUNTER — Other Ambulatory Visit (HOSPITAL_COMMUNITY): Payer: Self-pay

## 2022-10-25 NOTE — Telephone Encounter (Signed)
Per patients insurance ''jardiance 35m'' is not covered and there is not an alternative they prefer. However I have called the patient preferred pharmacy, and the pt uses a coupon card that cost her 0.00 for a 30 day supply.

## 2022-10-25 NOTE — Telephone Encounter (Signed)
I spoke with patient, gave her an update

## 2022-10-31 ENCOUNTER — Other Ambulatory Visit: Payer: Self-pay | Admitting: Physical Medicine & Rehabilitation

## 2022-10-31 ENCOUNTER — Other Ambulatory Visit: Payer: Self-pay | Admitting: Nurse Practitioner

## 2022-10-31 ENCOUNTER — Other Ambulatory Visit (HOSPITAL_COMMUNITY): Payer: Self-pay

## 2022-10-31 DIAGNOSIS — F419 Anxiety disorder, unspecified: Secondary | ICD-10-CM

## 2022-10-31 NOTE — Telephone Encounter (Signed)
Patient Advocate Encounter   Received notification that prior authorization for Emgality '120MG'$ /ML auto-injectors (migraine) is required.   PA submitted on 10/31/2022 [place] [id number] Insurance:  Charity fundraiser PA Form [phone] NCPDP) Status is pending       Lyndel Safe, [place] Patient Advocate Specialist [place] Patient Advocate Team Direct Number: [phone]  Fax: [phone]

## 2022-11-02 ENCOUNTER — Other Ambulatory Visit: Payer: Self-pay | Admitting: Nurse Practitioner

## 2022-11-02 DIAGNOSIS — F419 Anxiety disorder, unspecified: Secondary | ICD-10-CM

## 2022-11-02 MED ORDER — CLONAZEPAM 0.5 MG PO TABS: 0.5000 mg | ORAL_TABLET | Freq: Three times a day (TID) | ORAL | 0 refills | Status: DC | PRN

## 2022-11-03 ENCOUNTER — Other Ambulatory Visit: Payer: Self-pay | Admitting: Neurology

## 2022-11-05 NOTE — Progress Notes (Unsigned)
Cardiology Office Note:    Date:  11/06/2022   ID:  Virginia Douglas, DOB 04-25-1976, MRN [id number]  PCP:  [name], FNP  Cardiologist:  Donato Heinz, MD  Electrophysiologist:  None   Referring MD: [name], FNP   Chief Complaint  Patient presents with   Congestive Heart Failure    History of Present Illness:    Virginia Douglas is a 47 y.o. female with a hx of recently diagnosed combined systolic and diastolic heart failure, GERD, hypertension, diabetes who presents for follow-up.  She was referred by [name], NP for evaluation of abnormal EKG, initially seen on 08/07/2022.  She reports having chest pain which occurs about once per week.  Describes as sharp pain in center of her chest, lasts for 30 seconds or so.  She does not exercise but reports she is short of breath with minimal exertion, such as walking up stairs.  She reports having lightheadedness but denies any recent syncope.  Does report lower extremity swelling.  Reports palpitations where feels like heart is racing, occurs about once every 2 weeks.  She smoked 1 pack/day x 16 years, quit age 79.  No drug use.  Alcohol on special occasions.  Family history includes father had CVA age 64.  Echocardiogram 09/01/2022 showed EF 30 to 35%, normal RV function, no significant valvular disease.  Coronary CTA on 08/25/2022 showed normal coronary arteries.  Cardiac MRI 10/18/2022 showed LVEF 44%, RVEF 52%, no LGE.  Zio patch x 13 days 09/2022 showed no significant arrhythmias.  Since last clinic visit, she reports has been doing okay.  States she had a recent virus, heart was racing while she was sick.  Currently feels improved.  She reports intermittent dyspnea but denies any chest pain.  Reports some lower extremity edema.  Weight is down 6 pounds from last visit 2 months ago.  Reports some lightheadedness but denies any syncope.   Wt Readings from Last 3 Encounters:  11/06/22 221 lb 6.4 oz (100.4 kg)   10/15/22 219 lb 1.6 oz (99.4 kg)  10/12/22 218 lb 9.6 oz (99.2 kg)     Past Medical History:  Diagnosis Date   ADHD (attention deficit hyperactivity disorder)    Anemia    iron deficiency   as a teenager   Anxiety    Arthritis    Diabetes ([place])    Edema    GERD (gastroesophageal reflux disease)    Hypertension    patient denies- patient stated that the PCP said it was for heart prevention.   Migraine headache     Past Surgical History:  Procedure Laterality Date   ABDOMINAL HYSTERECTOMY  2012   CHOLECYSTECTOMY     COLONOSCOPY  12/28/2020   polyps   ESOPHAGEAL DILATION     gamma knife     04/2022   IR ANGIO INTRA EXTRACRAN SEL COM CAROTID INNOMINATE BILAT MOD SED  05/31/2021   IR ANGIO INTRA EXTRACRAN SEL COM CAROTID INNOMINATE BILAT MOD SED  06/27/2021   IR ANGIO INTRA EXTRACRAN SEL COM CAROTID INNOMINATE UNI R MOD SED  11/21/2021   IR ANGIO VERTEBRAL SEL VERTEBRAL UNI R MOD SED  05/31/2021   IR CT HEAD LTD  06/27/2021   IR CT HEAD LTD  11/21/2021   IR INTRA CRAN STENT  06/27/2021   IR INTRA CRAN STENT  11/21/2021   IR RADIOLOGIST EVAL & MGMT  06/03/2021   IR RADIOLOGIST EVAL & MGMT  07/13/2021   IR RADIOLOGIST EVAL &  MGMT  08/27/2021   IR RADIOLOGIST EVAL & MGMT  10/30/2021   IR RADIOLOGIST EVAL & MGMT  12/06/2021   IR US GUIDE VASC ACCESS RIGHT  05/31/2021   IR US GUIDE VASC ACCESS RIGHT  06/27/2021   IR US GUIDE VASC ACCESS RIGHT  11/21/2021   OVARIAN CYST REMOVAL     RADIOLOGY WITH ANESTHESIA N/A 06/27/2021   Procedure: STENTING;  Surgeon: Luanne Bras, MD;  Location: [facility];  Service: Radiology;  Laterality: N/A;   RADIOLOGY WITH ANESTHESIA N/A 11/21/2021   Procedure: IR WITH ANESTHESIA STENTING;  Surgeon: Luanne Bras, MD;  Location: [facility];  Service: Radiology;  Laterality: N/A;    Current Medications: Current Meds  Medication Sig   acetaZOLAMIDE (DIAMOX) 250 MG tablet TAKE 1 TABLET BY MOUTH TWICE A DAY   ADDERALL XR 30 MG 24 hr capsule Take  30 mg by mouth daily.   aspirin EC 81 MG tablet Take 81 mg by mouth daily. Swallow whole.   atorvastatin (LIPITOR) 10 MG tablet TAKE 1 TABLET BY MOUTH EVERY DAY (Patient taking differently: Take 10 mg by mouth daily.)   buPROPion (WELLBUTRIN XL) 150 MG 24 hr tablet TAKE 1 TABLET BY MOUTH EVERY DAY IN THE MORNING (Patient taking differently: Take 150 mg by mouth daily.)   carvedilol (COREG) 3.125 MG tablet Take 2 tablets (6.25 mg total) by mouth 2 (two) times daily.   cetirizine (ZYRTEC) 10 MG tablet Take 10 mg by mouth daily as needed for allergies.   clonazePAM (KLONOPIN) 0.5 MG tablet Take 1 tablet (0.5 mg total) by mouth 3 (three) times daily as needed.   clopidogrel (PLAVIX) 75 MG tablet Take 1 tablet (75 mg total) by mouth daily.   Dulaglutide (TRULICITY) 1.5 0000000 SOPN INJECT 1.5 MG INTO THE SKIN ONCE A WEEK.   DULoxetine (CYMBALTA) 60 MG capsule TAKE 1 CAPSULE BY MOUTH EVERY DAY (Patient taking differently: Take 60 mg by mouth daily.)   empagliflozin (JARDIANCE) 10 MG TABS tablet Take 1 tablet by mouth once daily in the morning   famotidine (PEPCID) 20 MG tablet Take 20 mg by mouth daily.   hydrOXYzine (VISTARIL) 50 MG capsule TAKE 1-2 CAPSULES BY MOUTH AS NEEDED FOR SLEEP AT BEDTIME (Patient taking differently: Take 100 mg by mouth at bedtime as needed (sleep). Take 1-2 capsules by mouth as needed for sleep at bedtime)   insulin glargine (LANTUS) 100 UNIT/ML Solostar Pen Inject 23 Units into the skin daily.   Insulin Pen Needle (PEN NEEDLES) 32G X 4 MM MISC 1 each by Does not apply route once a week.   linaclotide (LINZESS) 72 MCG capsule Take 1 capsule (72 mcg total) by mouth daily before breakfast.   losartan (COZAAR) 50 MG tablet Take 1 tablet (50 mg total) by mouth daily.   phentermine (ADIPEX-P) 37.5 MG tablet Take 1 tablet (37.5 mg total) by mouth daily before breakfast.   Polyethyl Glycol-Propyl Glycol (SYSTANE) 0.4-0.3 % SOLN Place 1 drop into both eyes daily as needed (dry  eyes).   spironolactone (ALDACTONE) 25 MG tablet Take 0.5 tablets (12.5 mg total) by mouth daily.   tiZANidine (ZANAFLEX) 4 MG tablet TAKE 1 TABLET (4 MG TOTAL) BY MOUTH EVERY 8 (EIGHT) HOURS AS NEEDED FOR MUSCLE SPASMS   topiramate (TOPAMAX) 50 MG tablet TAKE 2 AND 1/2 TABLETS (125 MG TOTAL) BY MOUTH AT BEDTIME. (Patient taking differently: Take 125 mg by mouth at bedtime.)   Vitamin D, Ergocalciferol, (DRISDOL) 1.25 MG (50000 UNIT) CAPS capsule TAKE 1  CAPSULE BY MOUTH EVERY 7 DAYS **NOT COVERED**     Allergies:   Patient has no known allergies.   Social History   Socioeconomic History   Marital status: Legally Separated    Spouse name: Jeneen Rinks   Number of children: 2   Years of education: 16   Highest education level: Some college, no degree  Occupational History   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[employer]  Tobacco Use   Smoking status: Former    Packs/day: 0.50    Years: 25.00    Total pack years: 12.50    Types: Cigarettes    Start date: 03/24/2019    Passive exposure: Current   Smokeless tobacco: Never   Tobacco comments:    06/24/21- quit over a year ago  Vaping Use   Vaping Use: Never used  Substance and Sexual Activity   Alcohol use: No    Alcohol/week: 0.0 standard drinks of alcohol   Drug use: No   Sexual activity: Yes    Birth control/protection: Surgical  Other Topics Concern   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 She lives with her 2 children in a one story house. She drinks one large cup of coffee a day and an occasional tea or soda. She walks daily.      One story home      Social Determinants of Health   Financial Resource Strain: Not on file  Food Insecurity: No Food Insecurity (10/13/2022)   Hunger Vital Sign    Worried  About Running Out of Food in the Last Year: Never true    Ran Out of Food in the Last Year: Never true  Transportation Needs: No Transportation Needs (10/13/2022)   PRAPARE - Hydrologist (Medical): No    Lack of Transportation (Non-Medical): No  Physical Activity: Not on file  Stress: Not on file  Social Connections: Not on file     Family History: The patient's family history includes Breast cancer in her maternal aunt and paternal grandmother; Cancer in her maternal aunt and maternal grandmother; Diabetes in her father, maternal grandmother, mother, and paternal grandmother; Healthy in her son and son; Hypertension in her brother, father, and mother; Kidney failure in her father; Pancreatic cancer in her maternal grandmother; Stroke in her paternal grandfather.  ROS:   Please see the history of present illness.     All other systems reviewed and are negative.  EKGs/Labs/Other Studies Reviewed:    The following studies were reviewed today:   EKG:   07/25/2022: Normal sinus rhythm, rate 89, LVH  Recent Labs: 07/25/2022: ALT 22 10/13/2022: Hemoglobin 13.6; Platelets 308 10/15/2022: BUN 8; Creatinine, Ser 0.65; Potassium 3.9; Sodium 136  Recent Lipid Panel    Component Value Date/Time   CHOL 166 07/25/2022 1001   TRIG 95 07/25/2022 1001   HDL 49 07/25/2022 1001   CHOLHDL 3.4 07/25/2022 1001   LDLCALC 99 07/25/2022 1001    Physical Exam:    VS:  BP 118/76   Pulse 75   Ht '5\' 7"'$  (1.702 m)   Wt 221 lb 6.4 oz (100.4 kg)   SpO2 98%   BMI 34.68 kg/m     Wt Readings from Last 3 Encounters:  11/06/22 221 lb 6.4 oz (100.4 kg)  10/15/22 219 lb 1.6 oz (99.4 kg)  10/12/22 218 lb 9.6 oz (99.2 kg)     GEN:  Well nourished, well developed in no acute distress HEENT: Normal NECK: No JVD; No carotid bruits LYMPHATICS: No lymphadenopathy CARDIAC: RRR, no murmurs, rubs, gallops RESPIRATORY:  Clear to auscultation without rales, wheezing or rhonchi   ABDOMEN: Soft, non-tender, non-distended MUSCULOSKELETAL:  No edema; No deformity  SKIN: Warm and dry NEUROLOGIC:  Alert and oriented x 3 PSYCHIATRIC:  Normal affect   ASSESSMENT:    1. Chronic combined systolic (congestive) and diastolic (congestive) heart failure (HCC)   2. Pain of lower extremity, unspecified laterality   3. Type 2 diabetes mellitus without complication, with long-term current use of insulin ([place])   4. Essential hypertension   5. Hyperlipidemia, unspecified hyperlipidemia type      PLAN:    Chronic combined heart failure: Echocardiogram 09/01/2022 showed EF 30 to 35%, normal RV function, no significant valvular disease.  Coronary CTA on 08/25/2022 showed normal coronary arteries.  Cardiac MRI 10/18/2022 showed LVEF 44%, RVEF 52%, no LGE. Appears euvolemic. -Continue Coreg 3.125 mg twice daily -Continue Jardiance 10 mg daily. -Continue losartan 50 mg daily -Add spironolactone 12.5 mg daily.  Check BMET -Has upcoming appointment with pharmacy heart failure clinic.  If tolerating spironolactone, next step would be to switch losartan to Entresto  Palpitations: Zio patch x 13 days 09/2022 showed no significant arrhythmias.  Hypertension: On losartan 50 mg daily, Coreg 3.125 mg twice daily and acetazolamide 250 mg twice daily.  Appears controlled.    Hyperlipidemia: On atorvastatin 10 mg daily.  LDL 99 07/25/22  T2DM: Admission 10/2022 with DKA.  Started on insulin.  A1c 11.4% 2/9/202, significantly increased from prior A1c 6.7%.  Refer to endocrinology  Left sigmoid sinus stenosis: Status post stenting 06/2021.  She is on aspirin and Plavix.  Left superior cerebellar AVM: Underwent gamma knife radiosurgery at [facility]  Idiopathic intracranial hypertension: On Diamox 250 mg twice daily  Snoring/daytime somnolence: Recommend an [place] sleep study.    Leg pain: Check ABIs  RTC in 3 months   Medication Adjustments/Labs and Tests Ordered: Current medicines are  reviewed at length with the patient today.  Concerns regarding medicines are outlined above.  Orders Placed This Encounter  Procedures   Basic metabolic panel   Ambulatory referral to Endocrinology   VAS [place] ABI WITH/WO TBI   VAS [place] LOWER EXTREMITY ARTERIAL DUPLEX   Meds ordered this encounter  Medications   spironolactone (ALDACTONE) 25 MG tablet    Sig: Take 0.5 tablets (12.5 mg total) by mouth daily.    Dispense:  45 tablet    Refill:  3    Patient Instructions  Medication Instructions:  START spironolactone 12.5 mg (1/2 tablet) daily  *If you need a refill on your cardiac medications before your next appointment, please call your pharmacy*   Lab Work: BMET today  If you have labs (blood work) drawn today and your tests are completely normal, you will receive your results only by: [place] (if you have MyChart) OR A paper copy in the mail If you have any lab test that is abnormal or we need to change your treatment, we will call you to review the results.   Testing/Procedures: Your physician has requested that you have an ankle brachial index (ABI). During this test an ultrasound and blood pressure cuff are used to evaluate the arteries that supply the arms and  legs with blood. Allow thirty minutes for this exam. There are no restrictions or special instructions.  Follow-Up: At [facility], you and your health needs are our priority.  As part of our continuing mission to provide you with exceptional heart care, we have created designated Provider Care Teams.  These Care Teams include your primary Cardiologist (physician) and Advanced Practice Providers (APPs -  Physician Assistants and Nurse Practitioners) who all work together to provide you with the care you need, when you need it.  We recommend signing up for the patient portal called "MyChart".  Sign up information is provided on this After Visit Summary.  MyChart is used to connect with patients for  Virtual Visits (Telemedicine).  Patients are able to view lab/test results, encounter notes, upcoming appointments, etc.  Non-urgent messages can be sent to your provider as well.   To learn more about what you can do with MyChart, go to [email or web address].    Your next appointment:   3/7 with pharmacist  3 month(s)  Provider:   Donato Heinz, MD     Other Instructions You have been referred to: Endocrinology     Signed, Donato Heinz, MD  11/06/2022 9:46 AM    [place]

## 2022-11-06 ENCOUNTER — Encounter: Payer: Self-pay | Admitting: Cardiology

## 2022-11-06 ENCOUNTER — Ambulatory Visit: Payer: No Typology Code available for payment source | Attending: Cardiology | Admitting: Cardiology

## 2022-11-06 VITALS — BP 118/76 | HR 75 | Ht 67.0 in | Wt 221.4 lb

## 2022-11-06 DIAGNOSIS — I10 Essential (primary) hypertension: Secondary | ICD-10-CM | POA: Diagnosis not present

## 2022-11-06 DIAGNOSIS — I5042 Chronic combined systolic (congestive) and diastolic (congestive) heart failure: Secondary | ICD-10-CM

## 2022-11-06 DIAGNOSIS — E119 Type 2 diabetes mellitus without complications: Secondary | ICD-10-CM | POA: Diagnosis not present

## 2022-11-06 DIAGNOSIS — M79606 Pain in leg, unspecified: Secondary | ICD-10-CM | POA: Diagnosis not present

## 2022-11-06 DIAGNOSIS — E785 Hyperlipidemia, unspecified: Secondary | ICD-10-CM

## 2022-11-06 DIAGNOSIS — Z794 Long term (current) use of insulin: Secondary | ICD-10-CM

## 2022-11-06 LAB — BASIC METABOLIC PANEL
BUN/Creatinine Ratio: 7 — ABNORMAL LOW (ref 9–23)
BUN: 5 mg/dL — ABNORMAL LOW (ref 6–24)
CO2: 19 mmol/L — ABNORMAL LOW (ref 20–29)
Calcium: 10.2 mg/dL (ref 8.7–10.2)
Chloride: 104 mmol/L (ref 96–106)
Creatinine, Ser: 0.69 mg/dL (ref 0.57–1.00)
Glucose: 140 mg/dL — ABNORMAL HIGH (ref 70–99)
Potassium: 4.3 mmol/L (ref 3.5–5.2)
Sodium: 139 mmol/L (ref 134–144)
eGFR: 109 mL/min/{1.73_m2} (ref >59)

## 2022-11-06 MED ORDER — SPIRONOLACTONE 25 MG PO TABS: 12.5000 mg | ORAL_TABLET | Freq: Every day | ORAL | 3 refills | Status: AC

## 2022-11-06 NOTE — Telephone Encounter (Signed)
Returned call to pt she states that she has a supplemental insurance that will "take care(pay for)" her medical bills when she will need it for her kidney failure and they are asking for Dr Gardiner Rhyme to write a letter stating why she is not eligible for a kidney transplant. Pt will forward email that is asking for this letter thru her Mychart.

## 2022-11-06 NOTE — Patient Instructions (Addendum)
Medication Instructions:  START spironolactone 12.5 mg (1/2 tablet) daily  *If you need a refill on your cardiac medications before your next appointment, please call your pharmacy*   Lab Work: BMET today  If you have labs (blood work) drawn today and your tests are completely normal, you will receive your results only by: [place] (if you have MyChart) OR A paper copy in the mail If you have any lab test that is abnormal or we need to change your treatment, we will call you to review the results.   Testing/Procedures: Your physician has requested that you have an ankle brachial index (ABI). During this test an ultrasound and blood pressure cuff are used to evaluate the arteries that supply the arms and legs with blood. Allow thirty minutes for this exam. There are no restrictions or special instructions.  Follow-Up: At [facility], you and your health needs are our priority.  As part of our continuing mission to provide you with exceptional heart care, we have created designated Provider Care Teams.  These Care Teams include your primary Cardiologist (physician) and Advanced Practice Providers (APPs -  Physician Assistants and Nurse Practitioners) who all work together to provide you with the care you need, when you need it.  We recommend signing up for the patient portal called "MyChart".  Sign up information is provided on this After Visit Summary.  MyChart is used to connect with patients for Virtual Visits (Telemedicine).  Patients are able to view lab/test results, encounter notes, upcoming appointments, etc.  Non-urgent messages can be sent to your provider as well.   To learn more about what you can do with MyChart, go to [email or web address].    Your next appointment:   3/7 with pharmacist  3 month(s)  Provider:   Donato Heinz, MD     Other Instructions You have been referred to: Endocrinology

## 2022-11-07 ENCOUNTER — Other Ambulatory Visit: Payer: Self-pay | Admitting: *Deleted

## 2022-11-07 DIAGNOSIS — I5042 Chronic combined systolic (congestive) and diastolic (congestive) heart failure: Secondary | ICD-10-CM

## 2022-11-08 ENCOUNTER — Ambulatory Visit (INDEPENDENT_AMBULATORY_CARE_PROVIDER_SITE_OTHER): Payer: No Typology Code available for payment source | Admitting: Nurse Practitioner

## 2022-11-08 ENCOUNTER — Encounter: Payer: Self-pay | Admitting: Nurse Practitioner

## 2022-11-08 ENCOUNTER — Telehealth: Payer: Self-pay | Admitting: *Deleted

## 2022-11-08 VITALS — BP 98/52 | HR 110 | Temp 98.5°F | Ht 67.0 in | Wt 219.0 lb

## 2022-11-08 DIAGNOSIS — Z09 Encounter for follow-up examination after completed treatment for conditions other than malignant neoplasm: Secondary | ICD-10-CM | POA: Diagnosis not present

## 2022-11-08 DIAGNOSIS — I10 Essential (primary) hypertension: Secondary | ICD-10-CM | POA: Diagnosis not present

## 2022-11-08 DIAGNOSIS — E1169 Type 2 diabetes mellitus with other specified complication: Secondary | ICD-10-CM | POA: Insufficient documentation

## 2022-11-08 DIAGNOSIS — E1110 Type 2 diabetes mellitus with ketoacidosis without coma: Secondary | ICD-10-CM | POA: Diagnosis not present

## 2022-11-08 DIAGNOSIS — E669 Obesity, unspecified: Secondary | ICD-10-CM | POA: Insufficient documentation

## 2022-11-08 MED ORDER — LOSARTAN POTASSIUM 50 MG PO TABS: 25.0000 mg | ORAL_TABLET | Freq: Every day | ORAL | 3 refills | Status: DC

## 2022-11-08 MED ORDER — FIASP FLEXTOUCH 100 UNIT/ML ~~LOC~~ SOPN: PEN_INJECTOR | SUBCUTANEOUS | 1 refills | Status: DC

## 2022-11-08 MED ORDER — MOUNJARO 5 MG/0.5ML ~~LOC~~ SOAJ: 5.0000 mg | SUBCUTANEOUS | 0 refills | Status: DC

## 2022-11-08 NOTE — Progress Notes (Addendum)
I,Sheena H Holbrook,acting as a Education administrator for [name], FNP.,have documented all relevant documentation on the behalf of [name], FNP,as directed by  [name], FNP while in the presence of [name], Keith.    Subjective:     Patient ID: Virginia Douglas , female    DOB: 06-30-1976 , 47 y.o.   MRN: [id number]   Chief Complaint  Patient presents with   Hospitalization Follow-up    10/12/22-10/15/22     HPI  Patient presents today for hospitalization follow up, admitted 10/12/22-10/15/22. She was feeling dizzy and vision was blurry, thirsty. She did not feel good but made herself go. She went to heart doctor and when called her blood sugar was 551 go to hospital to get IV, she did not go tried to take care of herself. She walked around the parking lot and drank some vinegar was down to 426, she was confused. She was thinking her son was calling her. She was having problems with her glucometer due to not having cord.   Patient states she is feeling some better.   She continues to check her blood sugar is in high 200's. She is on Lantus   After being discharged from the hospital she worked for 4 days. She got sick, unable to get out of bed Tuesday. Went to urgent care and did covid, flu which was negative. She had a fever, had body aches, cough (given cough syrup). She states "she has never felt that way before". The was having difficulty with walking. Her blood sugar went up, not reading on the glucometer - she did not go to the hospital. By day 4 her blood sugar started to come down.       Past Medical History:  Diagnosis Date   ADHD (attention deficit hyperactivity disorder)    Anemia    iron deficiency   as a teenager   Anxiety    Arthritis    Diabetes ([place])    Edema    GERD (gastroesophageal reflux disease)    Hypertension    patient denies- patient stated that the PCP said it was for heart prevention.   Migraine headache      Family History  Problem Relation  Age of Onset   Diabetes Mother    Hypertension Mother    Diabetes Father    Hypertension Father    Kidney failure Father    Hypertension Brother    Cancer Maternal Grandmother    Diabetes Maternal Grandmother    Pancreatic cancer Maternal Grandmother    Diabetes Paternal Grandmother    Breast cancer Paternal Grandmother    Stroke Paternal Grandfather    Healthy Son    Healthy Son    Cancer Maternal Aunt        breast   Breast cancer Maternal Aunt      Current Outpatient Medications:    ADDERALL XR 30 MG 24 hr capsule, Take 30 mg by mouth daily., Disp: , Rfl:    aspirin EC 81 MG tablet, Take 81 mg by mouth daily. Swallow whole., Disp: , Rfl:    atorvastatin (LIPITOR) 10 MG tablet, TAKE 1 TABLET BY MOUTH EVERY DAY (Patient taking differently: Take 10 mg by mouth daily.), Disp: 90 tablet, Rfl: 1   buPROPion (WELLBUTRIN XL) 150 MG 24 hr tablet, TAKE 1 TABLET BY MOUTH EVERY DAY IN THE MORNING (Patient taking differently: Take 150 mg by mouth daily.), Disp: 90 tablet, Rfl: 1   carvedilol (COREG) 3.125 MG tablet, Take  2 tablets (6.25 mg total) by mouth 2 (two) times daily., Disp: 180 tablet, Rfl: 3   cetirizine (ZYRTEC) 10 MG tablet, Take 10 mg by mouth daily as needed for allergies., Disp: , Rfl:    clonazePAM (KLONOPIN) 0.5 MG tablet, Take 1 tablet (0.5 mg total) by mouth 3 (three) times daily as needed., Disp: 30 tablet, Rfl: 0   clopidogrel (PLAVIX) 75 MG tablet, Take 1 tablet (75 mg total) by mouth daily., Disp: 90 tablet, Rfl: 2   DULoxetine (CYMBALTA) 60 MG capsule, TAKE 1 CAPSULE BY MOUTH EVERY DAY (Patient taking differently: Take 60 mg by mouth daily.), Disp: 90 capsule, Rfl: 1   empagliflozin (JARDIANCE) 10 MG TABS tablet, Take 1 tablet by mouth once daily in the morning, Disp: 30 tablet, Rfl: 5   famotidine (PEPCID) 20 MG tablet, Take 20 mg by mouth daily., Disp: , Rfl:    hydrOXYzine (VISTARIL) 50 MG capsule, TAKE 1-2 CAPSULES BY MOUTH AS NEEDED FOR SLEEP AT BEDTIME (Patient  taking differently: Take 100 mg by mouth at bedtime as needed (sleep). Take 1-2 capsules by mouth as needed for sleep at bedtime), Disp: 180 capsule, Rfl: 1   insulin aspart (FIASP FLEXTOUCH) 100 UNIT/ML FlexTouch Pen, Inject [place] insulin before meals and at hs if BS < 150 - 0 units, 150-199 - 3 units, 200-249 - 6 units, 250-299 - 9 units, 300-349 - 12 units, >350 - 15 units. If >400 call our office with your blood sugar readings., Disp: 15 mL, Rfl: 1   insulin glargine (LANTUS) 100 UNIT/ML Solostar Pen, Inject 23 Units into the skin daily., Disp: 15 mL, Rfl: 1   Insulin Pen Needle (PEN NEEDLES) 32G X 4 MM MISC, 1 each by Does not apply route once a week., Disp: 30 each, Rfl: 3   linaclotide (LINZESS) 72 MCG capsule, Take 1 capsule (72 mcg total) by mouth daily before breakfast., Disp: 90 capsule, Rfl: 1   phentermine (ADIPEX-P) 37.5 MG tablet, Take 1 tablet (37.5 mg total) by mouth daily before breakfast., Disp: 30 tablet, Rfl: 1   Polyethyl Glycol-Propyl Glycol (SYSTANE) 0.4-0.3 % SOLN, Place 1 drop into both eyes daily as needed (dry eyes)., Disp: , Rfl:    spironolactone (ALDACTONE) 25 MG tablet, Take 0.5 tablets (12.5 mg total) by mouth daily., Disp: 45 tablet, Rfl: 3   tirzepatide (MOUNJARO) 5 MG/0.5ML Pen, Inject 5 mg into the skin once a week., Disp: 2 mL, Rfl: 0   tiZANidine (ZANAFLEX) 4 MG tablet, TAKE 1 TABLET (4 MG TOTAL) BY MOUTH EVERY 8 (EIGHT) HOURS AS NEEDED FOR MUSCLE SPASMS, Disp: 270 tablet, Rfl: 1   acetaZOLAMIDE (DIAMOX) 250 MG tablet, Take 1 tablet (250 mg total) by mouth 2 (two) times daily., Disp: 180 tablet, Rfl: 1   Dulaglutide (TRULICITY) 1.5 0000000 SOPN, Inject 1.5 mg into the skin once a week., Disp: 6 mL, Rfl: 1   Galcanezumab-gnlm (EMGALITY) 120 MG/ML SOAJ, Inject 120 mg into the skin every 30 (thirty) days., Disp: 1.12 mL, Rfl: 5   Rimegepant Sulfate (NURTEC) 75 MG TBDP, Take 1 tablet (75 mg total) by mouth daily as needed., Disp: 8 tablet, Rfl: 11   sacubitril-valsartan  (ENTRESTO) 24-26 MG, Take 1 tablet by mouth 2 (two) times daily., Disp: 56 tablet, Rfl: 0   topiramate (TOPAMAX) 50 MG tablet, TAKE 2 AND 1/2 TABLETS (125 MG TOTAL) BY MOUTH AT BEDTIME. Strength: 50 mg, Disp: 225 tablet, Rfl: 1   Vitamin D, Ergocalciferol, (DRISDOL) 1.25 MG (50000 UNIT) CAPS capsule,  TAKE 1 CAPSULE BY MOUTH EVERY 7 DAYS **NOT COVERED**, Disp: 12 capsule, Rfl: 0   No Known Allergies   Review of Systems  Constitutional: Negative.   Respiratory: Negative.    Cardiovascular: Negative.   Musculoskeletal:  Positive for myalgias.  Neurological: Negative.   Psychiatric/Behavioral: Negative.       Today's Vitals   11/08/22 1621  BP: (!) 98/52  Pulse: (!) 110  Temp: 98.5 F (36.9 C)  TempSrc: Oral  SpO2: 96%  Weight: 219 lb (99.3 kg)  Height:  (1.702 m)   Body mass index is 34.3 kg/m.   Objective:  Physical Exam Vitals reviewed.  Constitutional:      General: She is not in acute distress.    Appearance: Normal appearance. She is well-developed. She is obese.  HENT:     Head: Normocephalic and atraumatic.  Eyes:     Pupils: Pupils are equal, round, and reactive to light.  Cardiovascular:     Rate and Rhythm: Normal rate and regular rhythm.     Pulses: Normal pulses.     Heart sounds: Normal heart sounds. No murmur heard. Pulmonary:     Effort: Pulmonary effort is normal. No respiratory distress.     Breath sounds: Normal breath sounds. No wheezing.  Musculoskeletal:        General: No tenderness.  Skin:    General: Skin is warm and dry.     Capillary Refill: Capillary refill takes less than 2 seconds.  Neurological:     General: No focal deficit present.     Mental Status: She is alert and oriented to person, place, and time.     Cranial Nerves: No cranial nerve deficit.     Motor: No weakness.  Psychiatric:        Mood and Affect: Mood normal.        Behavior: Behavior normal.        Thought Content: Thought content normal.        Judgment:  Judgment normal.         Assessment And Plan:     1. Diabetic ketoacidosis without coma associated with type 2 diabetes mellitus (HCC) Comments: She is to continue taking her fast acting insulin and continue with Mounjaro. - tirzepatide [facility]) 5 MG/0.5ML Pen; Inject 5 mg into the skin once a week.  Dispense: 2 mL; Refill: 0 - insulin aspart (FIASP FLEXTOUCH) 100 UNIT/ML FlexTouch Pen; Inject [place] insulin before meals and at hs if BS < 150 - 0 units, 150-199 - 3 units, 200-249 - 6 units, 250-299 - 9 units, 300-349 - 12 units, >350 - 15 units. If >400 call our office with your blood sugar readings.  Dispense: 15 mL; Refill: 1  2. Essential hypertension Comments: Blood pressure is controlled and a little lower, she is taking her blood pressure medications regularly. Encouraged to stay well hydrated with water.  3. Hospital discharge follow-up Was admitted to [facility] from 2/9 -2/11 for diabetic ketoacidosis. TCM Performed. A member of the clinical team spoke with the patient upon discharge. Discharge summary was reviewed in full detail during the visit. Meds reconciled and compared to discharge meds. Medication list is updated and reviewed with the patient.  Greater than 50% face to face time was spent in counseling an coordination of care.  All questions were answered to the satisfaction of the patient.    Patient was given opportunity to ask questions. Patient verbalized understanding of the plan and was able to repeat  key elements of the plan. All questions were answered to their satisfaction.  Arnette Felts, FNP   I, Arnette Felts, FNP, have reviewed all documentation for this visit. The documentation on 11/08/22 for the exam, diagnosis, procedures, and orders are all accurate and complete.   IF YOU HAVE BEEN REFERRED TO A SPECIALIST, IT MAY TAKE 1-2 WEEKS TO SCHEDULE/PROCESS THE REFERRAL. IF YOU HAVE NOT HEARD FROM US/SPECIALIST IN TWO WEEKS, PLEASE GIVE [place] A CALL AT [phone].    THE PATIENT IS ENCOURAGED TO PRACTICE SOCIAL DISTANCING DUE TO THE COVID-19 PANDEMIC.

## 2022-11-08 NOTE — Telephone Encounter (Signed)
Message from PCP received stating patients BP was low at appointment today.  Per Dr. Berta Minor losartan to 25 mg daily.     Patient aware and verbalized understanding.  Appt with pharmD 3/7, patient aware to keep appt and labs needed next week.

## 2022-11-09 ENCOUNTER — Encounter: Payer: Self-pay | Admitting: Pharmacist Clinician (PhC)/ Clinical Pharmacy Specialist

## 2022-11-09 ENCOUNTER — Ambulatory Visit
Payer: No Typology Code available for payment source | Attending: Cardiology | Admitting: Pharmacist Clinician (PhC)/ Clinical Pharmacy Specialist

## 2022-11-09 VITALS — BP 111/75 | HR 96

## 2022-11-09 DIAGNOSIS — I5020 Unspecified systolic (congestive) heart failure: Secondary | ICD-10-CM

## 2022-11-09 MED ORDER — ENTRESTO 24-26 MG PO TABS: 1.0000 | ORAL_TABLET | Freq: Two times a day (BID) | ORAL | 0 refills | Status: DC

## 2022-11-09 NOTE — Telephone Encounter (Signed)
Would clarify with patient that she has mild heart failure but no major organ failure and is not in need of a transplant

## 2022-11-09 NOTE — Telephone Encounter (Signed)
Her kidney function is normal

## 2022-11-09 NOTE — Assessment & Plan Note (Signed)
Assessment: BP in office today is excellent at 111/75 Patient with HFrEF  (30-35% by echo 12/23) (44% by MRI 2/24) Tolerates current medications - drop in BP lead to feeling washed out/dizzy Denies SOB, palpitation, chest pain, headaches,or edema No concerns with regards to cost of medications, compliance, ability to pick up at pharmacy Reiterated the importance of regular exercise and low salt diet  Plan: GDMT ACEI/ARB/ARNI ENTRESTO 24/26 Stop losartan, start Entresto tomorrow  Beta blocker CARVEDILOL 3.125 Decrease dose to 1 tab bid, (was taking 2 bid)  MRA SPIRONOLACTONE 12.5 Decrease dose to 1/2 tablet (12.5 mg) every other day  SGLT2 EMPAGLIFLOZIN 10  Continue with same dose   Labs orderd:  will repeat labs in 3 weeks at follow up - BMET Follow up with PharmD  in 3 weeks

## 2022-11-09 NOTE — Progress Notes (Signed)
Office Visit    Patient Name: Virginia Douglas [id number] Date of Encounter: 11/09/2022  Primary Care Provider:  [name], FNP Primary Cardiologist:  Donato Heinz, MD  Chief Complaint    Heart Failure Medication Titration - EF 44% (by MRI 10/18/22)  Significant Past Medical History   hypertension Controlled with HF medications  DM2 2/24 A1c 11.4 (up from 6.7 three months ago) - on aspoart, glargine, tirzepatide  migraines On daily topiramate  GERD On pepcid 20 mg daily  ADHD On Adderall XR 30 mg    No Known Allergies  History of Present Illness    Virginia Douglas is a 47 y.o. female patient of Dr Gardiner Rhyme, in the office today for heart failure medication titration.    Patient was referred to cardiology in December because of abnormal EKG and shortness of breath with minimal exertion.  She also reported occasional chest pain (about once weekly), swelling in her lower extremities and palpitations.  Cardiac testing showed EF to be 30-35% by echo, CAC =0, and no significant arrhythmias on 14 day monitor.  At January follow up she was switched from lisinopril hctz to losartan 50 mg, and started on carvedilol 3.125.   Most recently she was seen on Monday of this week and spironolactone 12.5 mg was added.  Yesterday we received a message from her PCP about a low in-office BP, and Dr. Gardiner Rhyme recommended lowering losartan to 25 mg.    Patient is in the office today for follow up.  She notes that her BP at the PCP yesterday was 98/52 and she did feel somewhat washed out.  She skipped her losartan this morning but has otherwise taken all of her medications.  Home blood pressures have not been that low.    Blood Pressure Goal:  130/80  GDMT: ACEI/ARB/ARNI '[x]'$ Yes '[]'$ No Losartan 25 mg qd  Beta blocker '[x]'$ Yes '[]'$ No Carvedilol 6.25 mg bid  MRA '[x]'$ Yes '[]'$ No Spironolactone 12.5 mg qd  SGLT2 inhibitor '[x]'$ Yes '[]'$ No Empagliflozin 10 mg    Family Hx:  father had stroke at 77, aunts  and uncles on both sides had heart disease; mother no known heart disease - was killed in 2020; brother has hypertension  Social Hx:      Tobacco: ppd x 16 years, quit 2 years ago  Alcohol: only occasionally  Caffeine: loves coffee both home and [employer], drinks iced coffee mostly,  Diet:  more home cooked meal; uses salt with cooking; uses air fryer or bakes - more chicken and fish, scallops, shrimp; vegetables are mostly frozen, some fresh  Exercise: limited 2/2 back issues,   Home BP readings:   highest 133/90, mostly [id number] systolic, diastolic fluctuates - was 98/52 yesterday at PCP - felt washed out. No readings that low at home  Adherence Assessment  Do you ever forget to take your medication? '[]'$ Yes '[x]'$ No  Do you ever skip doses due to side effects? '[]'$ Yes '[x]'$ No  Do you have trouble affording your medicines? '[]'$ Yes '[x]'$ No  Are you ever unable to pick up your medication due to transportation difficulties? '[]'$ Yes '[x]'$ No  Do you ever stop taking your medications because you don't believe they are helping? '[]'$ Yes '[x]'$ No   Adherence strategy: none   Accessory Clinical Findings    Lab Results  Component Value Date   CREATININE 0.69 11/06/2022   BUN 5 (L) 11/06/2022   NA 139 11/06/2022   K 4.3 11/06/2022   CL 104 11/06/2022   CO2 19 (L) 11/06/2022   Lab Results  Component Value Date   ALT 22 07/25/2022   AST 17 07/25/2022   ALKPHOS 144 (H) 07/25/2022   BILITOT 0.3 07/25/2022   Lab Results  Component Value Date   HGBA1C 11.4 (H) 10/13/2022    Home Medications/Allergies    Current Outpatient Medications  Medication Sig Dispense Refill   Galcanezumab-gnlm (EMGALITY) 120 MG/ML SOAJ Inject 120 mg into the skin every 30 (thirty) days.     acetaZOLAMIDE (DIAMOX) 250 MG tablet TAKE 1 TABLET BY MOUTH TWICE A DAY 60 tablet 2   ADDERALL XR 30 MG 24 hr capsule Take 30 mg by mouth daily.     aspirin EC 81 MG tablet Take 81 mg by mouth daily. Swallow whole.     atorvastatin  (LIPITOR) 10 MG tablet TAKE 1 TABLET BY MOUTH EVERY DAY (Patient taking differently: Take 10 mg by mouth daily.) 90 tablet 1   buPROPion (WELLBUTRIN XL) 150 MG 24 hr tablet TAKE 1 TABLET BY MOUTH EVERY DAY IN THE MORNING (Patient taking differently: Take 150 mg by mouth daily.) 90 tablet 1   carvedilol (COREG) 3.125 MG tablet Take 2 tablets (6.25 mg total) by mouth 2 (two) times daily. 180 tablet 3   cetirizine (ZYRTEC) 10 MG tablet Take 10 mg by mouth daily as needed for allergies.     clonazePAM (KLONOPIN) 0.5 MG tablet Take 1 tablet (0.5 mg total) by mouth 3 (three) times daily as needed. 30 tablet 0   clopidogrel (PLAVIX) 75 MG tablet Take 1 tablet (75 mg total) by mouth daily. 90 tablet 2   DULoxetine (CYMBALTA) 60 MG capsule TAKE 1 CAPSULE BY MOUTH EVERY DAY (Patient taking differently: Take 60 mg by mouth daily.) 90 capsule 1   empagliflozin (JARDIANCE) 10 MG TABS tablet Take 1 tablet by mouth once daily in the morning 30 tablet 5   famotidine (PEPCID) 20 MG tablet Take 20 mg by mouth daily.     hydrOXYzine (VISTARIL) 50 MG capsule TAKE 1-2 CAPSULES BY MOUTH AS NEEDED FOR SLEEP AT BEDTIME (Patient taking differently: Take 100 mg by mouth at bedtime as needed (sleep). Take 1-2 capsules by mouth as needed for sleep at bedtime) 180 capsule 1   insulin aspart (FIASP FLEXTOUCH) 100 UNIT/ML FlexTouch Pen Inject [place] insulin before meals and at hs if BS < 150 - 0 units, 150-199 - 3 units, 200-249 - 6 units, 250-299 - 9 units, 300-349 - 12 units, >350 - 15 units. If >400 call our office with your blood sugar readings. 15 mL 1   insulin glargine (LANTUS) 100 UNIT/ML Solostar Pen Inject 23 Units into the skin daily. 15 mL 1   Insulin Pen Needle (PEN NEEDLES) 32G X 4 MM MISC 1 each by Does not apply route once a week. 30 each 3   linaclotide (LINZESS) 72 MCG capsule Take 1 capsule (72 mcg total) by mouth daily before breakfast. 90 capsule 1   losartan (COZAAR) 50 MG tablet Take 0.5 tablets (25 mg total) by  mouth daily. 90 tablet 3   phentermine (ADIPEX-P) 37.5 MG tablet Take 1 tablet (37.5 mg total) by mouth daily before breakfast. 30 tablet 1   Polyethyl Glycol-Propyl Glycol (SYSTANE) 0.4-0.3 % SOLN Place 1 drop into both eyes daily as needed (dry eyes).     spironolactone (ALDACTONE) 25 MG tablet Take 0.5 tablets (12.5 mg total) by mouth daily. 45 tablet 3   tirzepatide (MOUNJARO) 5 MG/0.5ML Pen Inject 5 mg into the skin once a week. 2 mL 0  tiZANidine (ZANAFLEX) 4 MG tablet TAKE 1 TABLET (4 MG TOTAL) BY MOUTH EVERY 8 (EIGHT) HOURS AS NEEDED FOR MUSCLE SPASMS 270 tablet 1   topiramate (TOPAMAX) 50 MG tablet TAKE 2 AND 1/2 TABLETS (125 MG TOTAL) BY MOUTH AT BEDTIME. (Patient taking differently: Take 125 mg by mouth at bedtime.) 225 tablet 1   Vitamin D, Ergocalciferol, (DRISDOL) 1.25 MG (50000 UNIT) CAPS capsule TAKE 1 CAPSULE BY MOUTH EVERY 7 DAYS **NOT COVERED** 12 capsule 0   No current facility-administered medications for this visit.    No Known Allergies  Assessment & Plan      HFrEF (heart failure with reduced ejection fraction) (HCC) Assessment: BP in office today is excellent at 111/75 Patient with HFrEF  (30-35% by echo 12/23) (44% by MRI 2/24) Tolerates current medications - drop in BP lead to feeling washed out/dizzy Denies SOB, palpitation, chest pain, headaches,or edema No concerns with regards to cost of medications, compliance, ability to pick up at pharmacy Reiterated the importance of regular exercise and low salt diet  Plan: GDMT ACEI/ARB/ARNI ENTRESTO 24/26 Stop losartan, start Entresto tomorrow  Beta blocker CARVEDILOL 3.125 Decrease dose to 1 tab bid, (was taking 2 bid)  MRA SPIRONOLACTONE 12.5 Decrease dose to 1/2 tablet (12.5 mg) every other day  SGLT2 EMPAGLIFLOZIN 10  Continue with same dose   Labs orderd:  will repeat labs in 3 weeks at follow up - BMET Follow up with PharmD  in 3 weeks   Tommy Medal PharmD [facility]  [address] [place], [place] [address] [phone]

## 2022-11-09 NOTE — Patient Instructions (Signed)
Follow up appointment: Monday March 25 at 8 am  Take your meds as follows:  STOP LOSARTAN  START ENTRESTO 24/26 MG TWICE DAILY  DECREASE CARVEDILOL TO 3.125 MG (1 TABLET) TWICE DAILY  DECREASE SPIRONOLACTONE TO 1/2 TABLET EVERY OTHER DAY  CONTINUE WITH ALL OTHER MEDICATIONS  I would recommend that you stop the phentermine as soon as the [name] is covered by your insurance.    Check your blood pressure at home daily (if able) and keep record of the readings.  Hypertension "High blood pressure"  Hypertension is often called "The Silent Killer." It rarely causes symptoms until it is extremely  high or has done damage to other organs in the body. For this reason, you should have your  blood pressure checked regularly by your physician. We will check your blood pressure  every time you see a provider at one of our offices.   Your blood pressure reading consists of two numbers. Ideally, blood pressure should be  below 120/80. The first ("top") number is called the systolic pressure. It measures the  pressure in your arteries as your heart beats. The second ("bottom") number is called the diastolic pressure. It measures the pressure in your arteries as the heart relaxes between beats.  The benefits of getting your blood pressure under control are enormous. A 10-point  reduction in systolic blood pressure can reduce your risk of stroke by 27% and heart failure by 28%  Your blood pressure goal is <130/80  To check your pressure at home you will need to:  1. Sit up in a chair, with feet flat on the floor and back supported. Do not cross your ankles or legs. 2. Rest your left arm so that the cuff is about heart level. If the cuff goes on your upper arm,  then just relax the arm on the table, arm of the chair or your lap. If you have a wrist cuff, we  suggest relaxing your wrist against your chest (think of it as Pledging the Flag with the  wrong arm).  3. Place the cuff snugly around  your arm, about 1 inch above the crook of your elbow. The  cords should be inside the groove of your elbow.  4. Sit quietly, with the cuff in place, for about 5 minutes. After that 5 minutes press the power  button to start a reading. 5. Do not talk or move while the reading is taking place.  6. Record your readings on a sheet of paper. Although most cuffs have a memory, it is often  easier to see a pattern developing when the numbers are all in front of you.  7. You can repeat the reading after 1-3 minutes if it is recommended  Make sure your bladder is empty and you have not had caffeine or tobacco within the last 30 min  Always bring your blood pressure log with you to your appointments. If you have not brought your monitor in to be double checked for accuracy, please bring it to your next appointment.  You can find a list of quality blood pressure cuffs at validatebp.org

## 2022-11-09 NOTE — Telephone Encounter (Signed)
See duplicate mychart message

## 2022-11-13 ENCOUNTER — Other Ambulatory Visit (HOSPITAL_COMMUNITY): Payer: Self-pay

## 2022-11-13 NOTE — Telephone Encounter (Signed)
Patient Advocate Encounter  Received a fax from [place] regarding Prior Authorization for Emgality '120MG'$ /ML auto-injectors (migraine).   Authorization has been cancelled.    Test claim shows [facility] pharmacies not contracted with insurance.

## 2022-11-13 NOTE — Progress Notes (Unsigned)
NEUROLOGY FOLLOW UP OFFICE NOTE  Virginia Douglas [id number]  Assessment/Plan:   Migraine without aura, without status migrainosus, not intractable Bilateral sigmoid sinus stenosis s/p stent on left, plan for right as well Left PCA AVM History of idiopathic intracranial hypertension Questionable myoclonus?  Possibly related to duloxetine   Migraine prevention:  Aimovig '140mg'$ , topiramate'125mg'$  daily Migraine rescue:  Will have her try Nurtec IIH therapy:  acetazolamide '250mg'$  BID. Patient requests to remain on acetazolamide despite already taking topiramate and eye exams without papilledema.  Serum bicarb levels typically borderline low (19-20).  Continue to monitor. Neuropathic pain: Cymbalta '60mg'$  daily Limit use of pain relievers to no more than 2 days out of week to prevent risk of rebound or medication-overuse headache. Keep headache diary Follow up with me 6 months     Subjective:  Virginia Douglas is a 47 year old right-handed woman with diabetes and hypertension who follows up for migraines.   UPDATE: Nurtec *** As Aimovig is no longer formulary on her insurance, she was switched to [employer] ***  Intensity:  severe Duration:  30 to 60 minutes with Reyvow but no longer covered by insurance. Frequency:  once a week   Current NSAIDS:  none Current analgesics:  none Other current abortive:  none Current triptans: none Current ergotamine: None Current anti-emetic: Zofran 4 mg Current muscle relaxants: Robaxin Current anti-anxiolytic:  alprazolam Current sleep aide:  none Current Antihypertensive medications:  Lisinopril-HCTZ Current Antidepressant medications:  Cymbalta '60mg'$  daily Current Anticonvulsant medications: Topiramate '125mg'$  at bedtime, acetazolamide '250mg'$  twice daily, Lyrica '100mg'$  three times daily. Current anti-CGRP:  Emgality Current Vitamins/Herbal/Supplements:  turmeric Current Antihistamines/Decongestants:  none Other therapy:  Reyvow  (rescue) Hormone/birth control:  no    Caffeine: One soda a week Diet: Hydrates Exercise: Yes Depression: Yes; Anxiety: Yes Other pain: No Sleep hygiene: Varies   HISTORY: Onset: Remote history of migraines.  Controlled for many years.  Returned in May 2019.Marland Kitchen Location:  Right sided head/ear radiating down right side of neck.  Does not radiate down the right arm. Quality:  Pounding in head, burning in neck Initial intensity:  Severe.  She denies  thunderclap headache or severe headache that wakes her from sleep. Aura:  no Prodrome:  no Postdrome:  no Associated symptoms:  Nausea, vomiting, photophobia, phonophobia, blurred vision.  She denies associated unilateral numbness or weakness. Initial duration:  1 day Initial Frequency:  2 days a week Initial Frequency of abortive medication: 2 days a week Triggers:  Emotional stress Relieving factors:  Heating pad, Robaxin Activity:  Aggravates   She was evaluated in the ED on 02/10/18, where CTA of head and neck was performed and personally reviewed.  It demonstrated empty sella but otherwise unremarkable for mass lesion, aneurysm or dissection.  She was diagnosed with idiopathic intracranial hypertension diagnosed in 2019.  She is on Diamox '250mg'$  daily.  She is followed by ophthalmology.  Exam note from 10/10/18 stated "maybe slight nasal nerve elevation" but no definite signs of papilledema.   She has longstanding history of numbness and tingling in the feet.  NCV-EMG of lower extremities in 2017 was normal.  She reports left sided low back pain radiating down the side of the left leg with associated numbness in the side of leg and foot since early 2020  She had a lumbar X-ray which demonstrated mild dextroscoliosis apex L4.  No injury.  She also has endorsed right sided leg pain as well.  MRI of lumbar spine was performed on 05/09/2020 which was  personally reviewed and showed mild lumbar spondylosis and degenerative disc disease with very mild  bilateral foraminal stenosis at L5-S1.  She followed up with her PCP regarding right knee pain and swelling.  X-ray on 09/29/2020 was negative.  She reports generalized pain and numbness and tingling.  If she holds something, she may drop it.  Labs from 2021 include B12 388,  TSH  0.872, ANA negative, negative dsDNA, sed rate 13, CRP 10, RF negative.  Hgb A1c has increased over 2021 from 5.8 to 6.4.  Gabapentin previously used was ineffective for neuropathic pain.  She had a repeat NCV-EMG of the right upper and lower extremities on 01/04/2021, which was normal.    In June 2022, she woke up one morning and noted the right sided of her face was numb and mouth twisted.  She had difficulty drinking fluids and would drip out the side of her mouth.  She is not sure if she had trouble closing her eye or raising her eyebrow.  She had a slight headache but no facial pain, slurred speech, language difficulty or involvement of arm or leg.  It lasted about a week or a little bit longer. She had an MRI of the brain with and without contrast on 05/19/2021 which showed a small 8 mm enhancing vascular nodule within the left superior cerebellum.  She was referred to endovascular radiology.  Cerebral angiogram revealed a 3.4 mm x 4.3 mm AVM involving the left posterior cerebral artery P3 segment and high-grade left sigmoid sinus proximally and moderate to severe stenosis of right sigmoid sinus proximally.  She did endorse pulsatile tinnitus in the left ear.  She underwent endovascular revascularization of the high-grade stenosis of the left sigmoid sinus on 06/27/2021.  Headache and pulsatile tinnitus resolved.  MRV of head on 09/26/2021 personally reviewed showed stable and patent stent placement of the distal left transverse sinus and proximal sigmoid sinus stent placement.  She also noted pulsatile tinnitus in her right ear, so she underwent endovascular revascularization of the right transverse sinus sigmoid sinus junction stenosis  on 10/28/2021.  Underwent stereotactic surgery of the cerebral AVM at [facility] on 05/01/2022.     Past NSAIDS:  Ibuprofen, naproxen Past analgesics:  tramadol '50mg'$  Past abortive triptans: Sumatriptan 100 mg, rizatriptan '10mg'$ , eletriptan '40mg'$  Past muscle relaxants:  Flexeril Past anti-emetic:  no Past antihypertensive medications:  no Past antidepressant medications:  nortriptyline '50mg'$  (caused nausea/vomiting, problems sleeping) Past anticonvulsant medications:  gabapentin '300mg'$  twice daily (for neuropathic pain) Past CGRP inhibitor:  Ajovy, Aimovig '140mg'$  (effective) Past vitamins/Herbal/Supplements:  no Past antihistamines/decongestants:  no Other past therapies:  no   Family history of headache:  no   MRI of cervical spine from 09/22/15 was personally reviewed and was unremarkable.  PAST MEDICAL HISTORY: Past Medical History:  Diagnosis Date   ADHD (attention deficit hyperactivity disorder)    Anemia    iron deficiency   as a teenager   Anxiety    Arthritis    Diabetes ([place])    Edema    GERD (gastroesophageal reflux disease)    Hypertension    patient denies- patient stated that the PCP said it was for heart prevention.   Migraine headache     MEDICATIONS: Current Outpatient Medications on File Prior to Visit  Medication Sig Dispense Refill   acetaZOLAMIDE (DIAMOX) 250 MG tablet TAKE 1 TABLET BY MOUTH TWICE A DAY 60 tablet 2   ADDERALL XR 30 MG 24 hr capsule Take 30 mg by mouth daily.  aspirin EC 81 MG tablet Take 81 mg by mouth daily. Swallow whole.     atorvastatin (LIPITOR) 10 MG tablet TAKE 1 TABLET BY MOUTH EVERY DAY (Patient taking differently: Take 10 mg by mouth daily.) 90 tablet 1   buPROPion (WELLBUTRIN XL) 150 MG 24 hr tablet TAKE 1 TABLET BY MOUTH EVERY DAY IN THE MORNING (Patient taking differently: Take 150 mg by mouth daily.) 90 tablet 1   carvedilol (COREG) 3.125 MG tablet Take 2 tablets (6.25 mg total) by mouth 2 (two) times daily. 180 tablet 3   cetirizine  (ZYRTEC) 10 MG tablet Take 10 mg by mouth daily as needed for allergies.     clonazePAM (KLONOPIN) 0.5 MG tablet Take 1 tablet (0.5 mg total) by mouth 3 (three) times daily as needed. 30 tablet 0   clopidogrel (PLAVIX) 75 MG tablet Take 1 tablet (75 mg total) by mouth daily. 90 tablet 2   DULoxetine (CYMBALTA) 60 MG capsule TAKE 1 CAPSULE BY MOUTH EVERY DAY (Patient taking differently: Take 60 mg by mouth daily.) 90 capsule 1   empagliflozin (JARDIANCE) 10 MG TABS tablet Take 1 tablet by mouth once daily in the morning 30 tablet 5   famotidine (PEPCID) 20 MG tablet Take 20 mg by mouth daily.     Galcanezumab-gnlm (EMGALITY) 120 MG/ML SOAJ Inject 120 mg into the skin every 30 (thirty) days.     hydrOXYzine (VISTARIL) 50 MG capsule TAKE 1-2 CAPSULES BY MOUTH AS NEEDED FOR SLEEP AT BEDTIME (Patient taking differently: Take 100 mg by mouth at bedtime as needed (sleep). Take 1-2 capsules by mouth as needed for sleep at bedtime) 180 capsule 1   insulin aspart (FIASP FLEXTOUCH) 100 UNIT/ML FlexTouch Pen Inject Fruitridge Pocket insulin before meals and at hs if BS < 150 - 0 units, 150-199 - 3 units, 200-249 - 6 units, 250-299 - 9 units, 300-349 - 12 units, >350 - 15 units. If >400 call our office with your blood sugar readings. 15 mL 1   insulin glargine (LANTUS) 100 UNIT/ML Solostar Pen Inject 23 Units into the skin daily. 15 mL 1   Insulin Pen Needle (PEN NEEDLES) 32G X 4 MM MISC 1 each by Does not apply route once a week. 30 each 3   linaclotide (LINZESS) 72 MCG capsule Take 1 capsule (72 mcg total) by mouth daily before breakfast. 90 capsule 1   phentermine (ADIPEX-P) 37.5 MG tablet Take 1 tablet (37.5 mg total) by mouth daily before breakfast. 30 tablet 1   Polyethyl Glycol-Propyl Glycol (SYSTANE) 0.4-0.3 % SOLN Place 1 drop into both eyes daily as needed (dry eyes).     sacubitril-valsartan (ENTRESTO) 24-26 MG Take 1 tablet by mouth 2 (two) times daily. 56 tablet 0   spironolactone (ALDACTONE) 25 MG tablet Take 0.5  tablets (12.5 mg total) by mouth daily. 45 tablet 3   tirzepatide (MOUNJARO) 5 MG/0.5ML Pen Inject 5 mg into the skin once a week. 2 mL 0   tiZANidine (ZANAFLEX) 4 MG tablet TAKE 1 TABLET (4 MG TOTAL) BY MOUTH EVERY 8 (EIGHT) HOURS AS NEEDED FOR MUSCLE SPASMS 270 tablet 1   topiramate (TOPAMAX) 50 MG tablet TAKE 2 AND 1/2 TABLETS (125 MG TOTAL) BY MOUTH AT BEDTIME. (Patient taking differently: Take 125 mg by mouth at bedtime.) 225 tablet 1   Vitamin D, Ergocalciferol, (DRISDOL) 1.25 MG (50000 UNIT) CAPS capsule TAKE 1 CAPSULE BY MOUTH EVERY 7 DAYS **NOT COVERED** 12 capsule 0   [DISCONTINUED] metFORMIN (GLUCOPHAGE-XR) 500 MG 24 hr  tablet Take 2 tablets (1,000 mg total) by mouth 2 (two) times daily. (Patient not taking: Reported on 06/05/2019) 30 tablet 0   [DISCONTINUED] Phentermine-Topiramate 11.25-69 MG CP24 Take 1 tablet by mouth daily. 30 capsule 1   No current facility-administered medications on file prior to visit.    ALLERGIES: No Known Allergies  FAMILY HISTORY: Family History  Problem Relation Age of Onset   Diabetes Mother    Hypertension Mother    Diabetes Father    Hypertension Father    Kidney failure Father    Hypertension Brother    Cancer Maternal Grandmother    Diabetes Maternal Grandmother    Pancreatic cancer Maternal Grandmother    Diabetes Paternal Grandmother    Breast cancer Paternal Grandmother    Stroke Paternal Grandfather    Healthy Son    Healthy Son    Cancer Maternal Aunt        breast   Breast cancer Maternal Aunt       Objective:  *** General: No acute distress.  Patient appears well-groomed.   Head:  Normocephalic/atraumatic Eyes:  Fundi examined but not visualized Neck: supple, no paraspinal tenderness, full range of motion Heart:  Regular rate and rhythm Neurological Exam: ***   Metta Clines, DO  CC: [name], FNP

## 2022-11-14 ENCOUNTER — Ambulatory Visit (INDEPENDENT_AMBULATORY_CARE_PROVIDER_SITE_OTHER): Payer: No Typology Code available for payment source | Admitting: Neurology

## 2022-11-14 ENCOUNTER — Telehealth: Payer: Self-pay

## 2022-11-14 ENCOUNTER — Other Ambulatory Visit: Payer: Self-pay | Admitting: Neurology

## 2022-11-14 ENCOUNTER — Encounter: Payer: Self-pay | Admitting: Nurse Practitioner

## 2022-11-14 ENCOUNTER — Encounter: Payer: Self-pay | Admitting: Neurology

## 2022-11-14 VITALS — BP 117/69 | HR 87 | Ht 69.0 in | Wt 218.0 lb

## 2022-11-14 DIAGNOSIS — Q282 Arteriovenous malformation of cerebral vessels: Secondary | ICD-10-CM

## 2022-11-14 DIAGNOSIS — G932 Benign intracranial hypertension: Secondary | ICD-10-CM | POA: Diagnosis not present

## 2022-11-14 DIAGNOSIS — G43009 Migraine without aura, not intractable, without status migrainosus: Secondary | ICD-10-CM | POA: Diagnosis not present

## 2022-11-14 MED ORDER — NURTEC 75 MG PO TBDP: 75.0000 mg | ORAL_TABLET | Freq: Every day | ORAL | 11 refills | Status: DC | PRN

## 2022-11-14 MED ORDER — TOPIRAMATE 50 MG PO TABS: ORAL_TABLET | ORAL | 1 refills | Status: DC

## 2022-11-14 MED ORDER — EMGALITY 120 MG/ML ~~LOC~~ SOAJ: 120.0000 mg | SUBCUTANEOUS | 5 refills | Status: DC

## 2022-11-14 MED ORDER — ACETAZOLAMIDE 250 MG PO TABS: 250.0000 mg | ORAL_TABLET | Freq: Two times a day (BID) | ORAL | 1 refills | Status: DC

## 2022-11-14 NOTE — Telephone Encounter (Signed)
Per CMM the request for [name] has been denied because patient's recent A1C shows an increased in last lab results. An appeal can be filed, with a letter of medical necessity indicating the need for the medication and that the most recent A1C was due to illness.  [facility] [phone] Fax [phone]  LMN must be faxed with any relevant notes/labs for determination.  FYI - thanks!

## 2022-11-14 NOTE — Telephone Encounter (Signed)
Per pharmacy [facility] requires PA.  PA started via [facility]: [place]

## 2022-11-14 NOTE — Patient Instructions (Signed)
Emgality every 30 days Topiramate '125mg'$  at bedtime Acetazolamide '250mg'$  twice daily Duloxetine '60mg'$  daily Nurtec once daily as needed for migraine attack.

## 2022-11-17 ENCOUNTER — Other Ambulatory Visit: Payer: Self-pay

## 2022-11-17 ENCOUNTER — Other Ambulatory Visit: Payer: Self-pay | Admitting: Cardiology

## 2022-11-17 ENCOUNTER — Ambulatory Visit (HOSPITAL_COMMUNITY)
Admission: RE | Admit: 2022-11-17 | Discharge: 2022-11-17 | Disposition: A | Discharge status: Home / Self Care | Payer: No Typology Code available for payment source | Source: Ambulatory Visit | Attending: Cardiology

## 2022-11-17 DIAGNOSIS — R200 Anesthesia of skin: Secondary | ICD-10-CM | POA: Insufficient documentation

## 2022-11-17 DIAGNOSIS — M79606 Pain in leg, unspecified: Secondary | ICD-10-CM

## 2022-11-17 DIAGNOSIS — M79605 Pain in left leg: Secondary | ICD-10-CM | POA: Insufficient documentation

## 2022-11-17 DIAGNOSIS — I5042 Chronic combined systolic (congestive) and diastolic (congestive) heart failure: Secondary | ICD-10-CM

## 2022-11-17 DIAGNOSIS — Z794 Long term (current) use of insulin: Secondary | ICD-10-CM

## 2022-11-17 DIAGNOSIS — R202 Paresthesia of skin: Secondary | ICD-10-CM | POA: Diagnosis present

## 2022-11-17 DIAGNOSIS — M79604 Pain in right leg: Secondary | ICD-10-CM

## 2022-11-17 DIAGNOSIS — I10 Essential (primary) hypertension: Secondary | ICD-10-CM

## 2022-11-17 DIAGNOSIS — E785 Hyperlipidemia, unspecified: Secondary | ICD-10-CM

## 2022-11-17 LAB — VAS US ABI WITH/WO TBI
Left ABI: 1.11
Right ABI: 1.26

## 2022-11-18 LAB — BASIC METABOLIC PANEL
BUN/Creatinine Ratio: 16 (ref 9–23)
BUN: 11 mg/dL (ref 6–24)
CO2: 17 mmol/L — ABNORMAL LOW (ref 20–29)
Calcium: 9.9 mg/dL (ref 8.7–10.2)
Chloride: 105 mmol/L (ref 96–106)
Creatinine, Ser: 0.67 mg/dL (ref 0.57–1.00)
Glucose: 131 mg/dL — ABNORMAL HIGH (ref 70–99)
Potassium: 3.9 mmol/L (ref 3.5–5.2)
Sodium: 140 mmol/L (ref 134–144)
eGFR: 110 mL/min/{1.73_m2} (ref >59)

## 2022-11-22 ENCOUNTER — Other Ambulatory Visit: Payer: Self-pay | Admitting: Nurse Practitioner

## 2022-11-23 ENCOUNTER — Other Ambulatory Visit (HOSPITAL_COMMUNITY): Payer: Self-pay

## 2022-11-23 ENCOUNTER — Telehealth: Payer: Self-pay

## 2022-11-23 ENCOUNTER — Encounter: Payer: Self-pay | Admitting: Pharmacist Clinician (PhC)/ Clinical Pharmacy Specialist

## 2022-11-23 ENCOUNTER — Encounter
Payer: No Typology Code available for payment source | Attending: Physical Medicine & Rehabilitation | Admitting: Physical Medicine & Rehabilitation

## 2022-11-23 ENCOUNTER — Encounter: Payer: Self-pay | Admitting: Physical Medicine & Rehabilitation

## 2022-11-23 ENCOUNTER — Ambulatory Visit: Payer: No Typology Code available for payment source | Admitting: Nurse Practitioner

## 2022-11-23 VITALS — BP 117/78 | HR 83 | Ht 69.0 in | Wt 218.6 lb

## 2022-11-23 DIAGNOSIS — I110 Hypertensive heart disease with heart failure: Secondary | ICD-10-CM | POA: Insufficient documentation

## 2022-11-23 DIAGNOSIS — Z5181 Encounter for therapeutic drug level monitoring: Secondary | ICD-10-CM | POA: Insufficient documentation

## 2022-11-23 DIAGNOSIS — M797 Fibromyalgia: Secondary | ICD-10-CM | POA: Insufficient documentation

## 2022-11-23 DIAGNOSIS — I5042 Chronic combined systolic (congestive) and diastolic (congestive) heart failure: Secondary | ICD-10-CM | POA: Insufficient documentation

## 2022-11-23 DIAGNOSIS — F32A Depression, unspecified: Secondary | ICD-10-CM | POA: Diagnosis present

## 2022-11-23 DIAGNOSIS — Z79891 Long term (current) use of opiate analgesic: Secondary | ICD-10-CM | POA: Insufficient documentation

## 2022-11-23 DIAGNOSIS — G8929 Other chronic pain: Secondary | ICD-10-CM | POA: Diagnosis present

## 2022-11-23 DIAGNOSIS — G894 Chronic pain syndrome: Secondary | ICD-10-CM | POA: Diagnosis not present

## 2022-11-23 DIAGNOSIS — M545 Low back pain, unspecified: Secondary | ICD-10-CM | POA: Insufficient documentation

## 2022-11-23 HISTORY — DX: Chronic combined systolic (congestive) and diastolic (congestive) heart failure: I50.42

## 2022-11-23 NOTE — Progress Notes (Signed)
Subjective:    Patient ID: Virginia Douglas, female    DOB: 11/07/1975, 47 y.o.   MRN: [id number]  HPI   HPI  09/22/22 Virginia Douglas is a 47 y.o. year old female  who  has a past medical history of ADHD (attention deficit hyperactivity disorder), Anemia, Anxiety, Arthritis, Diabetes ([place]), Edema, GERD (gastroesophageal reflux disease), Hypertension, and Migraine headache.   They are presenting to PM&R clinic as a new patient for pain management evaluation. They were referred  for treatment of chronic pain.  She has had chronic pain for many years.  Currently her worst pain is in her lower back.  She reports she recently had worsening of her pain in her shoulder and neck and she says she thinks this is worsened by stress.  Sometimes the pain in her lower back will shoot down her legs.  She reports having history of fibromyalgia.  She often sleeps with multiple pillows to help take the pressure off her joints.  She also wears a back brace during the day.  [facility]  She reports multiple ESI's and these did help the pain however there caused a lot of weight gain.  She reports that she was previously on hydrocodone however she stopped following with her pain doctor because they kept pushing her to do back injections and she was unhappy with the weight gain which she felt was associated with these injections.  She has very poor sleep.  Ports her mood is okay currently although has had depression in the past.  She has been seen by Dr. Laurance Flatten orthopedics for her neck and shoulder pain.  She does report some altered sensation in her hands and feet, she has a history of diabetes mellitus however this is not causing her significant pain.   She currently has a heart monitor in place due to concerns of arrhythmia.   She is followed by rheumatology for Raynaud's disease, polyarthralgia, sicca complex.   Red flag symptoms: Patient denies saddle  anesthesia, loss of bowel or bladder continence, new weakness, new numbness/tingling, or pain waking up at nighttime.   Medications tried: Tylenol and NSAIDS minimal benefit Takes Tumaric Butrans- minimal benefit Tramadol didn't help Gabapentin and Lyrica didn't help Hydrocodone was helping the pain Has not tried Tylenol 3 Tizanidine did not help her pain in the past.      Other treatments: Tens minimal help Epidurals helped but caused weight gain Back Brace [facility]   Interval history 11/23/22 Ms. Minetti is here regarding follow-up of her chronic pain.  She continues to use Cymbalta and tizanidine, these medications are not keeping her pain controlled currently.  She continues to have severe pain in her back, shoulders, neck, legs.  ESI's did help in the past however it caused significant weight gain and she would not like to repeat this.  She feels that her activities continue to be severely limited by her pain.  She reports blood glucose has been elevated recently and she had a recent admission for DKA.   Pain Inventory Average Pain 9 Pain Right Now 8 My pain is sharp, dull, stabbing, tingling, and aching  In the last 24 hours, has pain interfered with the following? General activity 8 Relation with others 8 Enjoyment of life 8 What TIME of day is your pain at its worst? morning , daytime, evening, and night Sleep (in general) Poor  Pain is worse with:  walking, bending, sitting, and standing Pain improves with: rest, medication, and injections Relief from Meds: 9  Family History  Problem Relation Age of Onset   Diabetes Mother    Hypertension Mother    Diabetes Father    Hypertension Father    Kidney failure Father    Hypertension Brother    Cancer Maternal Grandmother    Diabetes Maternal Grandmother    Pancreatic cancer Maternal Grandmother    Diabetes Paternal Grandmother    Breast cancer Paternal Grandmother    Stroke  Paternal Grandfather    Healthy Son    Healthy Son    Cancer Maternal Aunt        breast   Breast cancer Maternal Aunt    Social History   Socioeconomic History   Marital status: Legally Separated    Spouse name: Jeneen Rinks   Number of children: 2   Years of education: 16   Highest education level: Some college, no degree  Occupational History   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[employer]  Tobacco Use   Smoking status: Former    Packs/day: 0.50    Years: 25.00    Additional pack years: 0.00    Total pack years: 12.50    Types: Cigarettes    Start date: 03/24/2019    Passive exposure: Current   Smokeless tobacco: Never   Tobacco comments:    06/24/21- quit over a year ago  Vaping Use   Vaping Use: Never used  Substance and Sexual Activity   Alcohol use: No    Alcohol/week: 0.0 standard drinks of alcohol   Drug use: No   Sexual activity: Yes    Birth control/protection: Surgical  Other Topics Concern   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 She lives with her 2 children in a one story house. She drinks one large cup of coffee a day and an occasional tea or soda. She walks daily.      One story home      Social Determinants of Health   Financial Resource Strain: Not on file  Food Insecurity: No Food Insecurity (10/13/2022)   Hunger Vital Sign    Worried About Running Out of Food in the Last Year: Never true    Ran Out of Food in the Last Year: Never true  Transportation Needs: No Transportation Needs (10/13/2022)   PRAPARE - Hydrologist (Medical): No    Lack of Transportation (Non-Medical): No  Physical Activity: Not on file  Stress: Not on file  Social Connections: Not on file   Past Surgical History:  Procedure  Laterality Date   ABDOMINAL HYSTERECTOMY  2012   CHOLECYSTECTOMY     COLONOSCOPY  12/28/2020   polyps   ESOPHAGEAL DILATION     gamma knife     04/2022   IR ANGIO INTRA EXTRACRAN SEL COM CAROTID INNOMINATE BILAT MOD SED  05/31/2021   IR ANGIO INTRA EXTRACRAN SEL COM CAROTID INNOMINATE BILAT MOD SED  06/27/2021   IR ANGIO INTRA EXTRACRAN SEL COM CAROTID INNOMINATE UNI R MOD SED  11/21/2021   IR ANGIO VERTEBRAL SEL VERTEBRAL UNI R MOD SED  05/31/2021   IR CT HEAD LTD  06/27/2021   IR CT HEAD  LTD  11/21/2021   IR INTRA CRAN STENT  06/27/2021   IR INTRA CRAN STENT  11/21/2021   IR RADIOLOGIST EVAL & MGMT  06/03/2021   IR RADIOLOGIST EVAL & MGMT  07/13/2021   IR RADIOLOGIST EVAL & MGMT  08/27/2021   IR RADIOLOGIST EVAL & MGMT  10/30/2021   IR RADIOLOGIST EVAL & MGMT  12/06/2021   IR US GUIDE VASC ACCESS RIGHT  05/31/2021   IR US GUIDE VASC ACCESS RIGHT  06/27/2021   IR US GUIDE VASC ACCESS RIGHT  11/21/2021   OVARIAN CYST REMOVAL     RADIOLOGY WITH ANESTHESIA N/A 06/27/2021   Procedure: STENTING;  Surgeon: Luanne Bras, MD;  Location: [facility];  Service: Radiology;  Laterality: N/A;   RADIOLOGY WITH ANESTHESIA N/A 11/21/2021   Procedure: IR WITH ANESTHESIA STENTING;  Surgeon: Luanne Bras, MD;  Location: [facility];  Service: Radiology;  Laterality: N/A;   Past Surgical History:  Procedure Laterality Date   ABDOMINAL HYSTERECTOMY  2012   CHOLECYSTECTOMY     COLONOSCOPY  12/28/2020   polyps   ESOPHAGEAL DILATION     gamma knife     04/2022   IR ANGIO INTRA EXTRACRAN SEL COM CAROTID INNOMINATE BILAT MOD SED  05/31/2021   IR ANGIO INTRA EXTRACRAN SEL COM CAROTID INNOMINATE BILAT MOD SED  06/27/2021   IR ANGIO INTRA EXTRACRAN SEL COM CAROTID INNOMINATE UNI R MOD SED  11/21/2021   IR ANGIO VERTEBRAL SEL VERTEBRAL UNI R MOD SED  05/31/2021   IR CT HEAD LTD  06/27/2021   IR CT HEAD LTD  11/21/2021   IR INTRA CRAN STENT  06/27/2021   IR INTRA CRAN STENT  11/21/2021   IR  RADIOLOGIST EVAL & MGMT  06/03/2021   IR RADIOLOGIST EVAL & MGMT  07/13/2021   IR RADIOLOGIST EVAL & MGMT  08/27/2021   IR RADIOLOGIST EVAL & MGMT  10/30/2021   IR RADIOLOGIST EVAL & MGMT  12/06/2021   IR US GUIDE VASC ACCESS RIGHT  05/31/2021   IR US GUIDE VASC ACCESS RIGHT  06/27/2021   IR US GUIDE VASC ACCESS RIGHT  11/21/2021   OVARIAN CYST REMOVAL     RADIOLOGY WITH ANESTHESIA N/A 06/27/2021   Procedure: STENTING;  Surgeon: Luanne Bras, MD;  Location: [facility];  Service: Radiology;  Laterality: N/A;   RADIOLOGY WITH ANESTHESIA N/A 11/21/2021   Procedure: IR WITH ANESTHESIA STENTING;  Surgeon: Luanne Bras, MD;  Location: [facility];  Service: Radiology;  Laterality: N/A;   Past Medical History:  Diagnosis Date   ADHD (attention deficit hyperactivity disorder)    Anemia    iron deficiency   as a teenager   Anxiety    Arthritis    Diabetes ([place])    Edema    GERD (gastroesophageal reflux disease)    Hypertension    patient denies- patient stated that the PCP said it was for heart prevention.   Migraine headache    BP 117/78   Pulse 83   Ht 5\' 9"  (1.753 m)   Wt 218 lb 9.6 oz (99.2 kg)   SpO2 98%   BMI 32.28 kg/m   Opioid Risk Score:   Fall Risk Score:  `1  Depression screen PHQ 2/9     11/08/2022    4:20 PM 10/11/2022    3:09 PM 09/22/2022    1:53 PM 07/25/2022    8:50 AM 01/31/2022    8:32 AM 02/28/2021    9:17 AM 11/16/2020    4:42  PM  Depression screen PHQ 2/9  Decreased Interest 0 0 1 1 1  0 3  Down, Depressed, Hopeless 0 0 1 1 1  0 3  PHQ - 2 Score 0 0 2 2 2  0 6  Altered sleeping   3 3 1 3 3   Tired, decreased energy   1 3 1 3 3   Change in appetite   1 3 1 3 3   Feeling bad or failure about yourself    0 1 0 0 3  Trouble concentrating   2 3 1  0 3  Moving slowly or fidgety/restless   0 1 1 0 3  Suicidal thoughts   0 0 0 0 3  PHQ-9 Score   9 16 7 9 27   Difficult doing work/chores    Somewhat difficult Not difficult at all Not difficult at all Very difficult       Review of Systems  Musculoskeletal:  Positive for back pain and neck pain.       Bilateral shoulder pain Bilateral hip pain Right knee pain   Neurological:  Positive for weakness and numbness.  All other systems reviewed and are negative.     Objective:   Physical Exam  Gen: no distress, normal appearing HEENT: oral mucosa pink and moist, NCAT Cardio: Reg rate Chest: normal effort, normal rate of breathing Abd: soft, non-distended Ext: no edema Psych: Very pleasant, normal affect Skin: intact Neuro: Alert and oriented, follows commands, cranial nerves II through XII intact, normal speech and language Sensation intact light touch in all 4 extremities Strength 5 out of 5 in all  4 extremities Musculoskeletal:  Lumbar paraspinal tenderness Slump test lower back pain Facet loading resulted in lower back pain last visit- not checked today Spurling's negative Tenderness throughout bilateral shoulders, elbows, wrists, knees, ankles, periscapular muscles to palpation       MRI L spine  Segmentation: The lowest lumbar type non-rib-bearing vertebra is labeled as L5.   Alignment:  Mild levoconvex lumbar scoliosis without subluxation.   Vertebrae:  No significant vertebral marrow edema is identified.   Conus medullaris and cauda equina: Conus extends to the L1 level. Conus and cauda equina appear normal.   Paraspinal and other soft tissues: Unremarkable   Disc levels:   T12-L1: Unremarkable.   L1-2: Unremarkable.   L2-3: Minimal disc bulge, no impingement.   L3-4: Minimal disc bulge, no impingement.   L4-5: Mild disc bulge, no impingement.   L5-S1: Borderline bilateral foraminal stenosis due to disc bulge and mild facet arthropathy   IMPRESSION: 1. Mild lumbar spondylosis and degenerative disc disease, causing borderline bilateral foraminal stenosis at L5-S1. 2. Mild levoconvex lumbar scoliosis without subluxation.     Performed     MRI C-spine  07/16/2022 1. Stable appearance of the cervical spine. 2. Minimal uncovertebral spurring at C6-7 and C7-T1 without significant stenosis at these levels. 3. Moderate right foraminal stenosis at T2-3 due to facet spurring. 4. Empty sella. This is nonspecific and most often incidental, but can be seen in the setting of idiopathic intracranial hypertension.      Assessment & Plan:   Chronic lower back pain and chronic neck pain -Prior MRI with minimal cervical spondylosis and mild lumbar spondylosis with mild degenerative disc disease resulting in borderline bilateral foraminal stenosis L5-S1, mild lumbar scoliosis -PT consult last visit- she had to stop this temporarily due to medical issues, plans to restart soon -Continue tizanidine  -Consider tylenol #3 twice daily as needed, UDS and Pain agreement today -  Provided list of foods that are good for chronic pain last visit -Opiate risk tool moderate   Fibromyalgia -Continue duloxetine, appears this medicine is helping improve her pain -She reports poor response to gabapentin and Lyrica -Discussed gradual progressive low impact exercise -Counseled regarding sleep hygiene   Depression -Improved with duloxetine, she is also seeing therapist -Denies SI or HI   ADHD -She is on Adderall currently   History of AVM -Continue follow-up with neurosurgery, SP gamma knife radiation surgery plan for repeat brain MRI in 6 months

## 2022-11-23 NOTE — Telephone Encounter (Signed)
Pharmacy Patient Advocate Encounter  Prior Authorization for JARDIANCE 10 MG has been approved.    PA# [id number] Effective dates: 11/23/22 through 11/22/25   Received notification from [facility] that prior authorization for JARDIANCE 10 MG is needed.    PA submitted on 11/23/22 Key [id number] Status is pending  Karie Soda, [place] Patient Advocate Specialist Direct Number: [phone] Fax: [phone]

## 2022-11-24 NOTE — Telephone Encounter (Signed)
Pt notified of approval

## 2022-11-26 LAB — TOXASSURE SELECT,+ANTIDEPR,UR

## 2022-11-27 ENCOUNTER — Encounter: Payer: Self-pay | Admitting: Pharmacist Clinician (PhC)/ Clinical Pharmacy Specialist

## 2022-11-27 ENCOUNTER — Ambulatory Visit
Payer: No Typology Code available for payment source | Attending: Cardiovascular Disease | Admitting: Pharmacist Clinician (PhC)/ Clinical Pharmacy Specialist

## 2022-11-27 ENCOUNTER — Encounter: Payer: Self-pay | Admitting: Nurse Practitioner

## 2022-11-27 VITALS — BP 113/73 | HR 86 | Ht 66.0 in | Wt 218.0 lb

## 2022-11-27 DIAGNOSIS — E1169 Type 2 diabetes mellitus with other specified complication: Secondary | ICD-10-CM

## 2022-11-27 DIAGNOSIS — I5020 Unspecified systolic (congestive) heart failure: Secondary | ICD-10-CM | POA: Diagnosis not present

## 2022-11-27 MED ORDER — TRULICITY 1.5 MG/0.5ML ~~LOC~~ SOAJ: 1.5000 mg | SUBCUTANEOUS | 1 refills | Status: DC

## 2022-11-27 NOTE — Patient Instructions (Signed)
Follow up appointment: with Dr. Gardiner Rhyme in June  Take your BP meds as follows: Continue with all current medications  Check your blood pressure at home daily (if able) and keep record of the readings.  Hypertension "High blood pressure"  Hypertension is often called "The Silent Killer." It rarely causes symptoms until it is extremely  high or has done damage to other organs in the body. For this reason, you should have your  blood pressure checked regularly by your physician. We will check your blood pressure  every time you see a provider at one of our offices.   Your blood pressure reading consists of two numbers. Ideally, blood pressure should be  below 120/80. The first ("top") number is called the systolic pressure. It measures the  pressure in your arteries as your heart beats. The second ("bottom") number is called the diastolic pressure. It measures the pressure in your arteries as the heart relaxes between beats.  The benefits of getting your blood pressure under control are enormous. A 10-point  reduction in systolic blood pressure can reduce your risk of stroke by 27% and heart failure by 28%  Your blood pressure goal is 130/80  To check your pressure at home you will need to:  1. Sit up in a chair, with feet flat on the floor and back supported. Do not cross your ankles or legs. 2. Rest your left arm so that the cuff is about heart level. If the cuff goes on your upper arm,  then just relax the arm on the table, arm of the chair or your lap. If you have a wrist cuff, we  suggest relaxing your wrist against your chest (think of it as Pledging the Flag with the  wrong arm).  3. Place the cuff snugly around your arm, about 1 inch above the crook of your elbow. The  cords should be inside the groove of your elbow.  4. Sit quietly, with the cuff in place, for about 5 minutes. After that 5 minutes press the power  button to start a reading. 5. Do not talk or move while the  reading is taking place.  6. Record your readings on a sheet of paper. Although most cuffs have a memory, it is often  easier to see a pattern developing when the numbers are all in front of you.  7. You can repeat the reading after 1-3 minutes if it is recommended  Make sure your bladder is empty and you have not had caffeine or tobacco within the last 30 min  Always bring your blood pressure log with you to your appointments. If you have not brought your monitor in to be double checked for accuracy, please bring it to your next appointment.  You can find a list of quality blood pressure cuffs at validatebp.org

## 2022-11-27 NOTE — Assessment & Plan Note (Addendum)
Assessment: BP in office today is 113/73 Patient with HFrEF Tolerates current medications without any side effects Denies SOB, palpitation, chest pain, headaches,or edema No concerns with regards to cost of medications, compliance, ability to pick up at pharmacy Reiterated the importance of regular exercise and low salt diet  Plan: GDMT  ACEI/ARB/ARNI Entresto 24/26 mg bid No change  Beta blocker Carvedilol 3.0125 mg bid No change  MRA Spironolactone 12.5 mg qd No change  SGLT2 Empagliflozin 10 mg qd No change   Labs orderd:  none Follow up with Dr Gardiner Rhyme in June

## 2022-11-27 NOTE — Progress Notes (Signed)
Office Visit    Patient Name: Virginia Douglas [id number] Date of Encounter: 11/27/2022  Primary Care Provider:  [name], FNP Primary Cardiologist:  Donato Heinz, MD  Chief Complaint    Heart Failure Medication Titration - EF 44% (by MRI 10/18/22)  Significant Past Medical History   hypertension Controlled with HF medications  DM2 2/24 A1c 11.4 (up from 6.7 three months ago) - on aspart, glargine, tirzepatide  migraines On daily topiramate  GERD On pepcid 20 mg daily  ADHD On Adderall XR 30 mg    No Known Allergies  History of Present Illness    Virginia Douglas is a 47 y.o. female patient of Dr Gardiner Rhyme, in the office today for heart failure medication titration.    Patient was referred to cardiology in December because of abnormal EKG and shortness of breath with minimal exertion.  She also reported occasional chest pain (about once weekly), swelling in her lower extremities and palpitations.  Cardiac testing showed EF to be 30-35% by echo, CAC =0, and no significant arrhythmias on 14 day monitor.  At January follow up she was switched from lisinopril hctz to losartan 50 mg, and started on carvedilol 3.125.  Spironolactone was later added.  She did have a low pressure in her PCP office after that and losartan was decreased to 25 mg.    I then saw her earlier this month and switched the losartan to Entresto 24/26 mg twice daily and decreased carvedilol to 3.125 mg twice daily and spironolactone to 12.5 mg daily.     She returns today for follow up.  States she has been doing well, although having trouble getting Trulicity prescription filled.  Her endocrinologist is trying to get [facility] approved, but having trouble with this also.  She states home BP readings have been mostly in the 130's at home, although doesn't have any of those numbers with her today.    Blood Pressure Goal:  130/80  GDMT: ACEI/ARB/ARNI [x] Yes [] No Entresto 24/26 mg bid  Beta blocker  [x] Yes [] No Carvedilol 3.125 mg bid  MRA [x] Yes [] No Spironolactone 12.5 mg qd  SGLT2 inhibitor [x] Yes [] No Empagliflozin 10 mg    Family Hx:  father had stroke at 54, aunts and uncles on both sides had heart disease; mother no known heart disease - was killed in 2020; brother has hypertension  Social Hx:      Tobacco: ppd x 16 years, quit 2 years ago  Alcohol: only occasionally  Caffeine: loves coffee both home and [employer], drinks iced coffee mostly,  Diet:  more home cooked meal; uses salt with cooking; uses air fryer or bakes - more chicken and fish, scallops, shrimp; vegetables are mostly frozen, some fresh  Exercise: limited 2/2 back issues,   Home BP readings:   none with her today, mostly 130/80 range  Adherence Assessment  Do you ever forget to take your medication? [] Yes [x] No  Do you ever skip doses due to side effects? [] Yes [x] No  Do you have trouble affording your medicines? [] Yes [x] No  Are you ever unable to pick up your medication due to transportation difficulties? [] Yes [x] No  Do you ever stop taking your medications because you don't believe they are helping? [] Yes [x] No   Adherence strategy: none   Accessory Clinical Findings    Lab Results  Component Value Date   CREATININE 0.67 11/17/2022   BUN 11 11/17/2022   NA 140 11/17/2022   K 3.9 11/17/2022   CL 105 11/17/2022  CO2 17 (L) 11/17/2022   Lab Results  Component Value Date   ALT 22 07/25/2022   AST 17 07/25/2022   ALKPHOS 144 (H) 07/25/2022   BILITOT 0.3 07/25/2022   Lab Results  Component Value Date   HGBA1C 11.4 (H) 10/13/2022    Home Medications/Allergies    Current Outpatient Medications  Medication Sig Dispense Refill   acetaZOLAMIDE (DIAMOX) 250 MG tablet Take 1 tablet (250 mg total) by mouth 2 (two) times daily. 180 tablet 1   ADDERALL XR 30 MG 24 hr capsule Take 30 mg by mouth daily.     aspirin EC 81 MG tablet Take 81 mg by mouth daily. Swallow whole.     atorvastatin  (LIPITOR) 10 MG tablet TAKE 1 TABLET BY MOUTH EVERY DAY (Patient taking differently: Take 10 mg by mouth daily.) 90 tablet 1   buPROPion (WELLBUTRIN XL) 150 MG 24 hr tablet TAKE 1 TABLET BY MOUTH EVERY DAY IN THE MORNING (Patient taking differently: Take 150 mg by mouth daily.) 90 tablet 1   carvedilol (COREG) 3.125 MG tablet Take 2 tablets (6.25 mg total) by mouth 2 (two) times daily. 180 tablet 3   cetirizine (ZYRTEC) 10 MG tablet Take 10 mg by mouth daily as needed for allergies.     clonazePAM (KLONOPIN) 0.5 MG tablet Take 1 tablet (0.5 mg total) by mouth 3 (three) times daily as needed. 30 tablet 0   clopidogrel (PLAVIX) 75 MG tablet Take 1 tablet (75 mg total) by mouth daily. 90 tablet 2   DULoxetine (CYMBALTA) 60 MG capsule TAKE 1 CAPSULE BY MOUTH EVERY DAY (Patient taking differently: Take 60 mg by mouth daily.) 90 capsule 1   empagliflozin (JARDIANCE) 10 MG TABS tablet Take 1 tablet by mouth once daily in the morning 30 tablet 5   famotidine (PEPCID) 20 MG tablet Take 20 mg by mouth daily.     Galcanezumab-gnlm (EMGALITY) 120 MG/ML SOAJ Inject 120 mg into the skin every 30 (thirty) days. 1.12 mL 5   hydrOXYzine (VISTARIL) 50 MG capsule TAKE 1-2 CAPSULES BY MOUTH AS NEEDED FOR SLEEP AT BEDTIME (Patient taking differently: Take 100 mg by mouth at bedtime as needed (sleep). Take 1-2 capsules by mouth as needed for sleep at bedtime) 180 capsule 1   insulin aspart (FIASP FLEXTOUCH) 100 UNIT/ML FlexTouch Pen Inject [place] insulin before meals and at hs if BS < 150 - 0 units, 150-199 - 3 units, 200-249 - 6 units, 250-299 - 9 units, 300-349 - 12 units, >350 - 15 units. If >400 call our office with your blood sugar readings. 15 mL 1   insulin glargine (LANTUS) 100 UNIT/ML Solostar Pen Inject 23 Units into the skin daily. 15 mL 1   Insulin Pen Needle (PEN NEEDLES) 32G X 4 MM MISC 1 each by Does not apply route once a week. 30 each 3   linaclotide (LINZESS) 72 MCG capsule Take 1 capsule (72 mcg total) by  mouth daily before breakfast. 90 capsule 1   phentermine (ADIPEX-P) 37.5 MG tablet Take 1 tablet (37.5 mg total) by mouth daily before breakfast. 30 tablet 1   Polyethyl Glycol-Propyl Glycol (SYSTANE) 0.4-0.3 % SOLN Place 1 drop into both eyes daily as needed (dry eyes).     Rimegepant Sulfate (NURTEC) 75 MG TBDP Take 1 tablet (75 mg total) by mouth daily as needed. 8 tablet 11   sacubitril-valsartan (ENTRESTO) 24-26 MG Take 1 tablet by mouth 2 (two) times daily. 56 tablet 0   spironolactone (ALDACTONE)  25 MG tablet Take 0.5 tablets (12.5 mg total) by mouth daily. 45 tablet 3   tirzepatide (MOUNJARO) 5 MG/0.5ML Pen Inject 5 mg into the skin once a week. 2 mL 0   tiZANidine (ZANAFLEX) 4 MG tablet TAKE 1 TABLET (4 MG TOTAL) BY MOUTH EVERY 8 (EIGHT) HOURS AS NEEDED FOR MUSCLE SPASMS 270 tablet 1   topiramate (TOPAMAX) 50 MG tablet TAKE 2 AND 1/2 TABLETS (125 MG TOTAL) BY MOUTH AT BEDTIME. Strength: 50 mg 225 tablet 1   Vitamin D, Ergocalciferol, (DRISDOL) 1.25 MG (50000 UNIT) CAPS capsule TAKE 1 CAPSULE BY MOUTH EVERY 7 DAYS **NOT COVERED** 12 capsule 0   No current facility-administered medications for this visit.    No Known Allergies  Assessment & Plan      HFrEF (heart failure with reduced ejection fraction) (HCC) Assessment: BP in office today is 113/73 Patient with HFrEF Tolerates current medications without any side effects Denies SOB, palpitation, chest pain, headaches,or edema No concerns with regards to cost of medications, compliance, ability to pick up at pharmacy Reiterated the importance of regular exercise and low salt diet  Plan: GDMT  ACEI/ARB/ARNI Entresto 24/26 mg bid No change  Beta blocker Carvedilol 3.0125 mg bid No change  MRA Spironolactone 12.5 mg qd No change  SGLT2 Empagliflozin 10 mg qd No change   Labs orderd:  none Follow up with Dr Gardiner Rhyme in [place] [facility] [place]  [address] [place], [place]  [id number] [phone]

## 2022-11-28 NOTE — Telephone Encounter (Signed)
LMN written by Doreene Burke, faxed to appeals dept [phone]

## 2022-11-30 NOTE — Telephone Encounter (Signed)
error 

## 2022-12-05 NOTE — Telephone Encounter (Signed)
Spoke with Virginia Douglas at [facility]. She had a few additional clinical questions, these were answered. The request for [name] has been APPROVED for 36 months.   Spoke with patient and advised. She will contact pharmacy to pick up rx. Nothing further needed at this time.

## 2022-12-08 ENCOUNTER — Telehealth: Payer: Self-pay | Admitting: *Deleted

## 2022-12-08 ENCOUNTER — Other Ambulatory Visit (HOSPITAL_COMMUNITY): Payer: No Typology Code available for payment source

## 2022-12-08 ENCOUNTER — Other Ambulatory Visit: Payer: Self-pay

## 2022-12-08 MED ORDER — PEN NEEDLES 32G X 4 MM MISC: 1.0000 | 3 refills | Status: AC

## 2022-12-08 NOTE — Telephone Encounter (Signed)
Urine drug screen for this encounter is consistent for prescribed medication. She is diabetic and ethyl glucuronide is most likely due to elevated blood glucose level.

## 2022-12-19 ENCOUNTER — Encounter: Payer: Self-pay | Admitting: Physical Medicine & Rehabilitation

## 2022-12-19 ENCOUNTER — Encounter
Payer: No Typology Code available for payment source | Attending: Physical Medicine & Rehabilitation | Admitting: Physical Medicine & Rehabilitation

## 2022-12-19 VITALS — BP 105/72 | HR 91 | Ht 66.0 in | Wt 218.0 lb

## 2022-12-19 DIAGNOSIS — M797 Fibromyalgia: Secondary | ICD-10-CM

## 2022-12-19 DIAGNOSIS — M545 Low back pain, unspecified: Secondary | ICD-10-CM | POA: Insufficient documentation

## 2022-12-19 DIAGNOSIS — G8929 Other chronic pain: Secondary | ICD-10-CM | POA: Diagnosis present

## 2022-12-19 DIAGNOSIS — F32A Depression, unspecified: Secondary | ICD-10-CM | POA: Insufficient documentation

## 2022-12-19 DIAGNOSIS — G894 Chronic pain syndrome: Secondary | ICD-10-CM | POA: Diagnosis not present

## 2022-12-19 DIAGNOSIS — Z79891 Long term (current) use of opiate analgesic: Secondary | ICD-10-CM | POA: Insufficient documentation

## 2022-12-19 MED ORDER — ACETAMINOPHEN-CODEINE 300-30 MG PO TABS: 1.0000 | ORAL_TABLET | ORAL | 0 refills | Status: DC | PRN

## 2022-12-19 MED ORDER — ACETAMINOPHEN-CODEINE 300-30 MG PO TABS: 1.0000 | ORAL_TABLET | Freq: Two times a day (BID) | ORAL | 0 refills | Status: DC | PRN

## 2022-12-19 NOTE — Progress Notes (Signed)
Subjective:    Patient ID: Virginia Douglas, female    DOB: 12/24/1975, 47 y.o.   MRN: [id number]  HPI    HPI  09/22/22 Virginia Douglas is a 47 y.o. year old female  who  has a past medical history of ADHD (attention deficit hyperactivity disorder), Anemia, Anxiety, Arthritis, Diabetes (HCC), Edema, GERD (gastroesophageal reflux disease), Hypertension, and Migraine headache.   They are presenting to PM&R clinic as a new patient for pain management evaluation. They were referred  for treatment of chronic pain.  She has had chronic pain for many years.  Currently her worst pain is in her lower back.  She reports she recently had worsening of her pain in her shoulder and neck and she says she thinks this is worsened by stress.  Sometimes the pain in her lower back will shoot down her legs.  She reports having history of fibromyalgia.  She often sleeps with multiple pillows to help take the pressure off her joints.  She also wears a back brace during the day.  [facility]  She reports multiple ESI's and these did help the pain however there caused a lot of weight gain.  She reports that she was previously on hydrocodone however she stopped following with her pain doctor because they kept pushing her to do back injections and she was unhappy with the weight gain which she felt was associated with these injections.  She has very poor sleep.  Ports her mood is okay currently although has had depression in the past.  She has been seen by Dr. Christell Constant orthopedics for her neck and shoulder pain.  She does report some altered sensation in her hands and feet, she has a history of diabetes mellitus however this is not causing her significant pain.   She currently has a heart monitor in place due to concerns of arrhythmia.   She is followed by rheumatology for Raynaud's disease, polyarthralgia, sicca complex.   Red flag symptoms: Patient denies saddle  anesthesia, loss of bowel or bladder continence, new weakness, new numbness/tingling, or pain waking up at nighttime.   Medications tried: Tylenol and NSAIDS minimal benefit Takes Tumaric Butrans- minimal benefit Tramadol didn't help Gabapentin and Lyrica didn't help Hydrocodone was helping the pain Has not tried Tylenol 3 Tizanidine did not help her pain in the past.       Other treatments: Tens minimal help Epidurals helped but caused weight gain Back Brace [facility]   Interval history 11/23/22 Virginia Douglas is here regarding follow-up of her chronic pain.  She continues to use Cymbalta and tizanidine, these medications are not keeping her pain controlled currently.  She continues to have severe pain in her back, shoulders, neck, legs.  ESI's did help in the past however it caused significant weight gain and she would not like to repeat this.  She feels that her activities continue to be severely limited by her pain.  She reports blood glucose has been elevated recently and she had a recent admission for DKA.   Interval history 12/19/2022 Virginia Douglas is here for follow-up of her chronic pain.  Her worst pain is currently in her shoulders and lower back today.  Current medications of Cymbalta and tizanidine are not keeping his pain well-controlled.  She again reports she wishes she could complete ESI's however this resulted in significant weight gain.  She has had a busy work schedule  and has been under a lot of stress.  She is trying to get scheduled for physical therapy and is trying to work around her schedule.   Pain Inventory Average Pain 7 Pain Right Now 6 My pain is constant, sharp, burning, dull, stabbing, tingling, and aching  In the last 24 hours, has pain interfered with the following? General activity 6 Relation with others 6 Enjoyment of life 6 What TIME of day is your pain at its worst? morning , daytime, evening, and night Sleep (in  general) Poor  Pain is worse with: walking, bending, sitting, standing, and some activites Pain improves with: rest, medication, injections, and heat Relief from Meds: 8  Family History  Problem Relation Age of Onset   Diabetes Mother    Hypertension Mother    Diabetes Father    Hypertension Father    Kidney failure Father    Hypertension Brother    Cancer Maternal Grandmother    Diabetes Maternal Grandmother    Pancreatic cancer Maternal Grandmother    Diabetes Paternal Grandmother    Breast cancer Paternal Grandmother    Stroke Paternal Grandfather    Healthy Son    Healthy Son    Cancer Maternal Aunt        breast   Breast cancer Maternal Aunt    Social History   Socioeconomic History   Marital status: Legally Separated    Spouse name: Fayrene Fearing   Number of children: 2   Years of education: 16   Highest education level: Some college, no degree  Occupational History   Occupation: Geophysical data processor: [employer]  Tobacco Use   Smoking status: Former    Packs/day: 0.50    Years: 25.00    Additional pack years: 0.00    Total pack years: 12.50    Types: Cigarettes    Start date: 03/24/2019    Passive exposure: Current   Smokeless tobacco: Never   Tobacco comments:    06/24/21- quit over a year ago  Vaping Use   Vaping Use: Never used  Substance and Sexual Activity   Alcohol use: No    Alcohol/week: 0.0 standard drinks of alcohol   Drug use: No   Sexual activity: Yes    Birth control/protection: Surgical  Other Topics Concern   Not on file  Social History Narrative   In relationship, 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 She lives with her 2 children in a one story house. She drinks one large cup of coffee a day and an occasional tea or soda. She walks daily.      One story home       Social Determinants of Health   Financial Resource Strain: Not on file  Food Insecurity: No Food Insecurity (10/13/2022)   Hunger Vital Sign    Worried About Running Out of Food in the Last Year: Never true    Ran Out of Food in the Last Year: Never true  Transportation Needs: No Transportation Needs (10/13/2022)   PRAPARE - Administrator, Civil Service (Medical): No    Lack of Transportation (Non-Medical): No  Physical Activity: Not on file  Stress: Not on file  Social Connections: Not on file   Past Surgical History:  Procedure Laterality Date   ABDOMINAL HYSTERECTOMY  2012  CHOLECYSTECTOMY     COLONOSCOPY  12/28/2020   polyps   ESOPHAGEAL DILATION     gamma knife     04/2022   IR ANGIO INTRA EXTRACRAN SEL COM CAROTID INNOMINATE BILAT MOD SED  05/31/2021   IR ANGIO INTRA EXTRACRAN SEL COM CAROTID INNOMINATE BILAT MOD SED  06/27/2021   IR ANGIO INTRA EXTRACRAN SEL COM CAROTID INNOMINATE UNI R MOD SED  11/21/2021   IR ANGIO VERTEBRAL SEL VERTEBRAL UNI R MOD SED  05/31/2021   IR CT HEAD LTD  06/27/2021   IR CT HEAD LTD  11/21/2021   IR INTRA CRAN STENT  06/27/2021   IR INTRA CRAN STENT  11/21/2021   IR RADIOLOGIST EVAL & MGMT  06/03/2021   IR RADIOLOGIST EVAL & MGMT  07/13/2021   IR RADIOLOGIST EVAL & MGMT  08/27/2021   IR RADIOLOGIST EVAL & MGMT  10/30/2021   IR RADIOLOGIST EVAL & MGMT  12/06/2021   IR US GUIDE VASC ACCESS RIGHT  05/31/2021   IR US GUIDE VASC ACCESS RIGHT  06/27/2021   IR US GUIDE VASC ACCESS RIGHT  11/21/2021   OVARIAN CYST REMOVAL     RADIOLOGY WITH ANESTHESIA N/A 06/27/2021   Procedure: STENTING;  Surgeon: Julieanne Cotton, MD;  Location: [facility] OR;  Service: Radiology;  Laterality: N/A;   RADIOLOGY WITH ANESTHESIA N/A 11/21/2021   Procedure: IR WITH ANESTHESIA STENTING;  Surgeon: Julieanne Cotton, MD;  Location: [facility] OR;  Service: Radiology;  Laterality: N/A;   Past Surgical History:  Procedure Laterality Date   ABDOMINAL HYSTERECTOMY   2012   CHOLECYSTECTOMY     COLONOSCOPY  12/28/2020   polyps   ESOPHAGEAL DILATION     gamma knife     04/2022   IR ANGIO INTRA EXTRACRAN SEL COM CAROTID INNOMINATE BILAT MOD SED  05/31/2021   IR ANGIO INTRA EXTRACRAN SEL COM CAROTID INNOMINATE BILAT MOD SED  06/27/2021   IR ANGIO INTRA EXTRACRAN SEL COM CAROTID INNOMINATE UNI R MOD SED  11/21/2021   IR ANGIO VERTEBRAL SEL VERTEBRAL UNI R MOD SED  05/31/2021   IR CT HEAD LTD  06/27/2021   IR CT HEAD LTD  11/21/2021   IR INTRA CRAN STENT  06/27/2021   IR INTRA CRAN STENT  11/21/2021   IR RADIOLOGIST EVAL & MGMT  06/03/2021   IR RADIOLOGIST EVAL & MGMT  07/13/2021   IR RADIOLOGIST EVAL & MGMT  08/27/2021   IR RADIOLOGIST EVAL & MGMT  10/30/2021   IR RADIOLOGIST EVAL & MGMT  12/06/2021   IR US GUIDE VASC ACCESS RIGHT  05/31/2021   IR US GUIDE VASC ACCESS RIGHT  06/27/2021   IR US GUIDE VASC ACCESS RIGHT  11/21/2021   OVARIAN CYST REMOVAL     RADIOLOGY WITH ANESTHESIA N/A 06/27/2021   Procedure: STENTING;  Surgeon: Julieanne Cotton, MD;  Location: [facility] OR;  Service: Radiology;  Laterality: N/A;   RADIOLOGY WITH ANESTHESIA N/A 11/21/2021   Procedure: IR WITH ANESTHESIA STENTING;  Surgeon: Julieanne Cotton, MD;  Location: [facility] OR;  Service: Radiology;  Laterality: N/A;   Past Medical History:  Diagnosis Date   ADHD (attention deficit hyperactivity disorder)    Anemia    iron deficiency   as a teenager   Anxiety    Arthritis    Diabetes    Edema    GERD (gastroesophageal reflux disease)    Hypertension    patient denies- patient stated that the PCP said it was for heart prevention.   Migraine  headache    Ht 5\' 6"  (1.676 m)   Wt 218 lb (98.9 kg)   BMI 35.19 kg/m   Opioid Risk Score:   Fall Risk Score:  `1  Depression screen [facility] 2/9     12/19/2022    9:45 AM 11/08/2022    4:20 PM 10/11/2022    3:09 PM 09/22/2022    1:53 PM 07/25/2022    8:50 AM 01/31/2022    8:32 AM 02/28/2021    9:17 AM  Depression screen PHQ 2/9   Decreased Interest 0 0 0 1 1 1  0  Down, Depressed, Hopeless 0 0 0 1 1 1  0  PHQ - 2 Score 0 0 0 2 2 2  0  Altered sleeping    3 3 1 3   Tired, decreased energy    1 3 1 3   Change in appetite    1 3 1 3   Feeling bad or failure about yourself     0 1 0 0  Trouble concentrating    2 3 1  0  Moving slowly or fidgety/restless    0 1 1 0  Suicidal thoughts    0 0 0 0  PHQ-9 Score    9 16 7 9   Difficult doing work/chores     Somewhat difficult Not difficult at all Not difficult at all    Review of Systems  All other systems reviewed and are negative.      Objective:   Physical Exam    Gen: no distress, normal appearing HEENT: oral mucosa pink and moist, NCAT Cardio: Reg rate Chest: normal effort, normal rate of breathing Abd: soft, non-distended Ext: no edema Psych: Very pleasant, normal affect Skin: intact Neuro: Alert and oriented, follows commands, cranial nerves II through XII intact, normal speech and language Sensation intact light touch in all 4 extremities Strength 5 out of 5 in all  4 extremities Musculoskeletal:  Lumbar paraspinal tenderness Slump test caused lower back pain Facet loading resulted in lower back pain today Spurling's negative Tenderness throughout bilateral shoulders, elbows, wrists, knees, hips, ankles, periscapular muscles to palpation       MRI L spine  Segmentation: The lowest lumbar type non-rib-bearing vertebra is labeled as L5.   Alignment:  Mild levoconvex lumbar scoliosis without subluxation.   Vertebrae:  No significant vertebral marrow edema is identified.   Conus medullaris and cauda equina: Conus extends to the L1 level. Conus and cauda equina appear normal.   Paraspinal and other soft tissues: Unremarkable   Disc levels:   T12-L1: Unremarkable.   L1-2: Unremarkable.   L2-3: Minimal disc bulge, no impingement.   L3-4: Minimal disc bulge, no impingement.   L4-5: Mild disc bulge, no impingement.   L5-S1: Borderline  bilateral foraminal stenosis due to disc bulge and mild facet arthropathy   IMPRESSION: 1. Mild lumbar spondylosis and degenerative disc disease, causing borderline bilateral foraminal stenosis at L5-S1. 2. Mild levoconvex lumbar scoliosis without subluxation.     Performed     MRI C-spine 07/16/2022 1. Stable appearance of the cervical spine. 2. Minimal uncovertebral spurring at C6-7 and C7-T1 without significant stenosis at these levels. 3. Moderate right foraminal stenosis at T2-3 due to facet spurring. 4. Empty sella. This is nonspecific and most often incidental, but can be seen in the setting of idiopathic intracranial hypertension.      Assessment & Plan:   Chronic lower back pain and chronic neck pain -Prior MRI with minimal cervical spondylosis and mild lumbar spondylosis with  mild degenerative disc disease resulting in borderline bilateral foraminal stenosis L5-S1, mild lumbar scoliosis -PT consult last visit- she had to stop this temporarily due to medical issues, plans to restart soon -Continue tizanidine  -Order tylenol #3 twice daily#60 -Provided list of foods that are good for chronic pain prior visit -Opiate risk tool moderate   Fibromyalgia -Continue duloxetine, appears this medicine is helping improve her pain -She reports poor response to gabapentin and Lyrica -Discussed gradual progressive low impact exercise -Counseled regarding sleep hygiene -Low-dose naltrexone could be considered, this would not be beneficial if she was on opioid medications   Depression -Improved with duloxetine, she is also seeing therapist, reports these are helping -She currently has a lot of stress regarding her job -Denies SI or HI   ADHD -She is on Adderall currently   History of AVM -Continue follow-up with neurosurgery, SP gamma knife radiation surgery plan for repeat brain MRI in 6 months

## 2022-12-26 ENCOUNTER — Encounter: Payer: Self-pay | Admitting: Physical Medicine & Rehabilitation

## 2022-12-26 MED ORDER — ACETAMINOPHEN-CODEINE 300-30 MG PO TABS: 1.0000 | ORAL_TABLET | Freq: Two times a day (BID) | ORAL | 0 refills | Status: DC | PRN

## 2022-12-27 NOTE — Telephone Encounter (Signed)
error 

## 2022-12-28 ENCOUNTER — Other Ambulatory Visit: Payer: Self-pay | Admitting: Neurology

## 2022-12-28 ENCOUNTER — Other Ambulatory Visit: Payer: Self-pay | Admitting: Nurse Practitioner

## 2022-12-28 DIAGNOSIS — F419 Anxiety disorder, unspecified: Secondary | ICD-10-CM

## 2022-12-28 DIAGNOSIS — E1110 Type 2 diabetes mellitus with ketoacidosis without coma: Secondary | ICD-10-CM

## 2022-12-29 ENCOUNTER — Other Ambulatory Visit: Payer: Self-pay | Admitting: Student

## 2022-12-29 DIAGNOSIS — Q282 Arteriovenous malformation of cerebral vessels: Secondary | ICD-10-CM

## 2022-12-29 MED ORDER — CLOPIDOGREL BISULFATE 75 MG PO TABS
75.0000 mg | ORAL_TABLET | Freq: Every day | ORAL | 0 refills | Status: AC
Start: 2022-12-29 — End: 2023-03-29

## 2022-12-29 NOTE — Progress Notes (Signed)
Interventional Radiology Brief Note:  Refilled Plavix 75mg  PO daily #90 to preferred pharmacy-- [facility].  Loyce Dys, MS RD PA-C

## 2023-01-01 ENCOUNTER — Other Ambulatory Visit: Payer: Self-pay | Admitting: Nurse Practitioner

## 2023-01-01 DIAGNOSIS — F419 Anxiety disorder, unspecified: Secondary | ICD-10-CM

## 2023-01-01 MED ORDER — PHENTERMINE HCL 37.5 MG PO TABS: 37.5000 mg | ORAL_TABLET | Freq: Every day | ORAL | 1 refills | Status: DC

## 2023-01-01 MED ORDER — CLONAZEPAM 0.5 MG PO TABS: 0.5000 mg | ORAL_TABLET | Freq: Two times a day (BID) | ORAL | 0 refills | Status: DC | PRN

## 2023-01-02 ENCOUNTER — Other Ambulatory Visit: Payer: Self-pay | Admitting: Nurse Practitioner

## 2023-01-02 DIAGNOSIS — E1110 Type 2 diabetes mellitus with ketoacidosis without coma: Secondary | ICD-10-CM

## 2023-01-03 ENCOUNTER — Other Ambulatory Visit: Payer: Self-pay

## 2023-01-03 ENCOUNTER — Other Ambulatory Visit: Payer: Self-pay | Admitting: Nurse Practitioner

## 2023-01-03 DIAGNOSIS — F419 Anxiety disorder, unspecified: Secondary | ICD-10-CM

## 2023-01-03 DIAGNOSIS — E119 Type 2 diabetes mellitus without complications: Secondary | ICD-10-CM

## 2023-01-03 DIAGNOSIS — E1110 Type 2 diabetes mellitus with ketoacidosis without coma: Secondary | ICD-10-CM

## 2023-01-03 MED ORDER — MOUNJARO 5 MG/0.5ML ~~LOC~~ SOAJ
SUBCUTANEOUS | 1 refills | Status: DC
Start: 2023-01-03 — End: 2023-01-15

## 2023-01-08 DIAGNOSIS — Z1231 Encounter for screening mammogram for malignant neoplasm of breast: Secondary | ICD-10-CM

## 2023-01-11 ENCOUNTER — Other Ambulatory Visit: Payer: Self-pay | Admitting: Nurse Practitioner

## 2023-01-11 ENCOUNTER — Encounter: Payer: Self-pay | Admitting: Neurology

## 2023-01-11 DIAGNOSIS — K5909 Other constipation: Secondary | ICD-10-CM

## 2023-01-15 ENCOUNTER — Ambulatory Visit (INDEPENDENT_AMBULATORY_CARE_PROVIDER_SITE_OTHER): Payer: No Typology Code available for payment source | Admitting: Nurse Practitioner

## 2023-01-15 VITALS — BP 116/80 | HR 96 | Temp 98.4°F | Ht 66.0 in | Wt 218.0 lb

## 2023-01-15 DIAGNOSIS — E1169 Type 2 diabetes mellitus with other specified complication: Secondary | ICD-10-CM | POA: Diagnosis not present

## 2023-01-15 DIAGNOSIS — F3341 Major depressive disorder, recurrent, in partial remission: Secondary | ICD-10-CM

## 2023-01-15 DIAGNOSIS — Z79899 Other long term (current) drug therapy: Secondary | ICD-10-CM

## 2023-01-15 DIAGNOSIS — F419 Anxiety disorder, unspecified: Secondary | ICD-10-CM

## 2023-01-15 DIAGNOSIS — G4709 Other insomnia: Secondary | ICD-10-CM

## 2023-01-15 DIAGNOSIS — E1159 Type 2 diabetes mellitus with other circulatory complications: Secondary | ICD-10-CM | POA: Diagnosis not present

## 2023-01-15 DIAGNOSIS — Z23 Encounter for immunization: Secondary | ICD-10-CM | POA: Diagnosis not present

## 2023-01-15 DIAGNOSIS — I152 Hypertension secondary to endocrine disorders: Secondary | ICD-10-CM | POA: Diagnosis not present

## 2023-01-15 DIAGNOSIS — E669 Obesity, unspecified: Secondary | ICD-10-CM

## 2023-01-15 DIAGNOSIS — Z6835 Body mass index (BMI) 35.0-35.9, adult: Secondary | ICD-10-CM

## 2023-01-15 DIAGNOSIS — E6609 Other obesity due to excess calories: Secondary | ICD-10-CM

## 2023-01-15 MED ORDER — PHENTERMINE HCL 37.5 MG PO TABS: 37.5000 mg | ORAL_TABLET | Freq: Every day | ORAL | 1 refills | Status: DC

## 2023-01-15 MED ORDER — ESZOPICLONE 2 MG PO TABS
2.0000 mg | ORAL_TABLET | Freq: Every day | ORAL | 5 refills | Status: DC
Start: 2023-01-15 — End: 2024-05-08

## 2023-01-15 MED ORDER — MOUNJARO 7.5 MG/0.5ML ~~LOC~~ SOAJ
7.5000 mg | SUBCUTANEOUS | 5 refills | Status: DC
Start: 2023-01-15 — End: 2023-08-06

## 2023-01-15 MED ORDER — BUPROPION HCL ER (XL) 150 MG PO TB24
150.0000 mg | ORAL_TABLET | ORAL | 2 refills | Status: DC
Start: 2023-01-15 — End: 2023-02-13

## 2023-01-15 MED ORDER — CLONAZEPAM 0.5 MG PO TABS: 0.5000 mg | ORAL_TABLET | Freq: Every day | ORAL | 2 refills | Status: DC | PRN

## 2023-01-15 NOTE — Patient Instructions (Signed)
Hypertension, Adult ?Hypertension is another name for high blood pressure. High blood pressure forces your heart to work harder to pump blood. This can cause problems over time. ?There are two numbers in a blood pressure reading. There is a top number (systolic) over a bottom number (diastolic). It is best to have a blood pressure that is below 120/80. ?What are the causes? ?The cause of this condition is not known. Some other conditions can lead to high blood pressure. ?What increases the risk? ?Some lifestyle factors can make you more likely to develop high blood pressure: ?Smoking. ?Not getting enough exercise or physical activity. ?Being overweight. ?Having too much fat, sugar, calories, or salt (sodium) in your diet. ?Drinking too much alcohol. ?Other risk factors include: ?Having any of these conditions: ?Heart disease. ?Diabetes. ?High cholesterol. ?Kidney disease. ?Obstructive sleep apnea. ?Having a family history of high blood pressure and high cholesterol. ?Age. The risk increases with age. ?Stress. ?What are the signs or symptoms? ?High blood pressure may not cause symptoms. Very high blood pressure (hypertensive crisis) may cause: ?Headache. ?Fast or uneven heartbeats (palpitations). ?Shortness of breath. ?Nosebleed. ?Vomiting or feeling like you may vomit (nauseous). ?Changes in how you see. ?Very bad chest pain. ?Feeling dizzy. ?Seizures. ?How is this treated? ?This condition is treated by making healthy lifestyle changes, such as: ?Eating healthy foods. ?Exercising more. ?Drinking less alcohol. ?Your doctor may prescribe medicine if lifestyle changes do not help enough and if: ?Your top number is above 130. ?Your bottom number is above 80. ?Your personal target blood pressure may vary. ?Follow these instructions at home: ?Eating and drinking ? ?If told, follow the DASH eating plan. To follow this plan: ?Fill one half of your plate at each meal with fruits and vegetables. ?Fill one fourth of your plate  at each meal with whole grains. Whole grains include whole-wheat pasta, brown rice, and whole-grain bread. ?Eat or drink low-fat dairy products, such as skim milk or low-fat yogurt. ?Fill one fourth of your plate at each meal with low-fat (lean) proteins. Low-fat proteins include fish, chicken without skin, eggs, beans, and tofu. ?Avoid fatty meat, cured and processed meat, or chicken with skin. ?Avoid pre-made or processed food. ?Limit the amount of salt in your diet to less than 1,500 mg each day. ?Do not drink alcohol if: ?Your doctor tells you not to drink. ?You are pregnant, may be pregnant, or are planning to become pregnant. ?If you drink alcohol: ?Limit how much you have to: ?0-1 drink a day for women. ?0-2 drinks a day for men. ?Know how much alcohol is in your drink. In the [place] one drink equals one 12 oz bottle of beer (355 mL), one 5 oz glass of wine (148 mL), or one 1? oz glass of hard liquor (44 mL). ?Lifestyle ? ?Work with your doctor to stay at a healthy weight or to lose weight. Ask your doctor what the best weight is for you. ?Get at least 30 minutes of exercise that causes your heart to beat faster (aerobic exercise) most days of the week. This may include walking, swimming, or biking. ?Get at least 30 minutes of exercise that strengthens your muscles (resistance exercise) at least 3 days a week. This may include lifting weights or doing Pilates. ?Do not smoke or use any products that contain nicotine or tobacco. If you need help quitting, ask your doctor. ?Check your blood pressure at home as told by your doctor. ?Keep all follow-up visits. ?Medicines ?Take over-the-counter and prescription medicines   only as told by your doctor. Follow directions carefully. ?Do not skip doses of blood pressure medicine. The medicine does not work as well if you skip doses. Skipping doses also puts you at risk for problems. ?Ask your doctor about side effects or reactions to medicines that you should watch  for. ?Contact a doctor if: ?You think you are having a reaction to the medicine you are taking. ?You have headaches that keep coming back. ?You feel dizzy. ?You have swelling in your ankles. ?You have trouble with your vision. ?Get help right away if: ?You get a very bad headache. ?You start to feel mixed up (confused). ?You feel weak or numb. ?You feel faint. ?You have very bad pain in your: ?Chest. ?Belly (abdomen). ?You vomit more than once. ?You have trouble breathing. ?These symptoms may be an emergency. Get help right away. Call 911. ?Do not wait to see if the symptoms will go away. ?Do not drive yourself to the hospital. ?Summary ?Hypertension is another name for high blood pressure. ?High blood pressure forces your heart to work harder to pump blood. ?For most people, a normal blood pressure is less than 120/80. ?Making healthy choices can help lower blood pressure. If your blood pressure does not get lower with healthy choices, you may need to take medicine. ?This information is not intended to replace advice given to you by your health care provider. Make sure you discuss any questions you have with your health care provider. ?Document Revised: 06/09/2021 Document Reviewed: 06/09/2021 ?Elsevier Patient Education ? 2023 [employer] ? ?

## 2023-01-15 NOTE — Progress Notes (Signed)
Hershal Coria Martin,acting as a Neurosurgeon for Arnette Felts, FNP.,have documented all relevant documentation on the behalf of Arnette Felts, FNP,as directed by  Arnette Felts, FNP while in the presence of Arnette Felts, FNP.    Subjective:     Patient ID: Virginia Douglas , female    DOB: 01-23-1976 , 47 y.o.   MRN: [id number]   Chief Complaint  Patient presents with   Diabetes   Hypertension    HPI  Patient presents today for DM and BP patient states compliance with medications and has no other concerns today. Lantus is making her itch she thinks - denies shortness of breath. Denies tongue swelling. She had multiple deaths during the time her HgbA1c increased.   She has been using clonazepam to help her to sleep has tried guanfacine without relief from her ADHD provider  Wt Readings from Last 3 Encounters: 01/15/23 : 218 lb (98.9 kg) 12/19/22 : 218 lb (98.9 kg) 11/27/22 : 218 lb (98.9 kg)   Wt Readings from Last 3 Encounters: 01/15/23 : 218 lb (98.9 kg) 12/19/22 : 218 lb (98.9 kg) 11/27/22 : 218 lb (98.9 kg)    Diabetes She presents for her follow-up diabetic visit. She has type 2 diabetes mellitus. There are no hypoglycemic associated symptoms. There are no diabetic associated symptoms. There are no hypoglycemic complications. Risk factors for coronary artery disease include obesity and sedentary lifestyle. Current diabetic treatment includes oral agent (dual therapy) and oral agent (monotherapy). She is compliant with treatment most of the time. She has not had a previous visit with a dietitian. She rarely participates in exercise. (Blood sugar 78 - 115)  Hypertension This is a chronic problem. The current episode started more than 1 year ago. The problem is unchanged. The problem is controlled. There are no associated agents to hypertension. Risk factors for coronary artery disease include obesity and sedentary lifestyle. Past treatments include diuretics and ACE inhibitors. The  current treatment provides significant improvement. Compliance problems include exercise (unable due to back pain).  There is no history of chronic renal disease.  Insomnia Primary symptoms: fragmented sleep, no sleep disturbance, frequent awakening.   The current episode started more than one month. The onset quality is sudden. The problem occurs nightly. The problem has been gradually worsening since onset. The symptoms are aggravated by family issues (tragic death of her mother). PMH includes: no hypertension.      Past Medical History:  Diagnosis Date   ADHD (attention deficit hyperactivity disorder)    Anemia    iron deficiency   as a teenager   Anxiety    Arthritis    Diabetes (HCC)    Edema    GERD (gastroesophageal reflux disease)    Hypertension    patient denies- patient stated that the PCP said it was for heart prevention.   Migraine headache      Family History  Problem Relation Age of Onset   Diabetes Mother    Hypertension Mother    Diabetes Father    Hypertension Father    Kidney failure Father    Hypertension Brother    Cancer Maternal Grandmother    Diabetes Maternal Grandmother    Pancreatic cancer Maternal Grandmother    Diabetes Paternal Grandmother    Breast cancer Paternal Grandmother    Stroke Paternal Grandfather    Healthy Son    Healthy Son    Cancer Maternal Aunt        breast   Breast cancer Maternal Aunt  Current Outpatient Medications:    acetaminophen-codeine (TYLENOL #3) 300-30 MG tablet, Take 1 tablet by mouth every 12 (twelve) hours as needed for moderate pain., Disp: 60 tablet, Rfl: 0   acetaZOLAMIDE (DIAMOX) 250 MG tablet, Take 1 tablet (250 mg total) by mouth 2 (two) times daily., Disp: 180 tablet, Rfl: 1   ADDERALL XR 30 MG 24 hr capsule, Take 30 mg by mouth daily., Disp: , Rfl:    aspirin EC 81 MG tablet, Take 81 mg by mouth daily. Swallow whole., Disp: , Rfl:    atorvastatin (LIPITOR) 10 MG tablet, TAKE 1 TABLET BY MOUTH  EVERY DAY (Patient taking differently: Take 10 mg by mouth daily.), Disp: 90 tablet, Rfl: 1   buPROPion (WELLBUTRIN XL) 150 MG 24 hr tablet, Take 1 tablet (150 mg total) by mouth every morning., Disp: 30 tablet, Rfl: 2   carvedilol (COREG) 3.125 MG tablet, Take 2 tablets (6.25 mg total) by mouth 2 (two) times daily., Disp: 180 tablet, Rfl: 3   cetirizine (ZYRTEC) 10 MG tablet, Take 10 mg by mouth daily as needed for allergies., Disp: , Rfl:    clopidogrel (PLAVIX) 75 MG tablet, Take 1 tablet (75 mg total) by mouth daily., Disp: 90 tablet, Rfl: 0   DULoxetine (CYMBALTA) 60 MG capsule, TAKE 1 CAPSULE BY MOUTH EVERY DAY, Disp: 90 capsule, Rfl: 1   empagliflozin (JARDIANCE) 10 MG TABS tablet, Take 1 tablet by mouth once daily in the morning, Disp: 30 tablet, Rfl: 5   eszopiclone (LUNESTA) 2 MG TABS tablet, Take 1 tablet (2 mg total) by mouth at bedtime. Take immediately before bedtime, Disp: 30 tablet, Rfl: 5   famotidine (PEPCID) 20 MG tablet, Take 20 mg by mouth daily., Disp: , Rfl:    Galcanezumab-gnlm (EMGALITY) 120 MG/ML SOAJ, Inject 120 mg into the skin every 30 (thirty) days., Disp: 1.12 mL, Rfl: 5   guanFACINE (INTUNIV) 1 MG TB24 ER tablet, Take 1 mg by mouth daily., Disp: , Rfl:    insulin aspart (FIASP FLEXTOUCH) 100 UNIT/ML FlexTouch Pen, INJECT [place] INSULIN BEFORE MEALS AND AT HS IF BS < 150 - 0 UNITS, 150-199 - 3 UNITS, 200-249 - 6 UNITS, 250-299 - 9 UNITS, 300-349 - 12 UNITS, >350 - 15 UNITS. IF >400 CALL OUR OFFICE WITH YOUR BLOOD SUGAR READINGS., Disp: 15 mL, Rfl: 1   insulin glargine (LANTUS) 100 UNIT/ML Solostar Pen, Inject 23 Units into the skin daily., Disp: 15 mL, Rfl: 1   Insulin Pen Needle (PEN NEEDLES) 32G X 4 MM MISC, 1 each by Does not apply route once a week., Disp: 30 each, Rfl: 3   Lancets (ONETOUCH DELICA PLUS LANCET33G) MISC, Apply 1 each topically 3 (three) times daily., Disp: , Rfl:    LINZESS 72 MCG capsule, TAKE 1 CAPSULE BY MOUTH DAILY BEFORE BREAKFAST., Disp: 30 capsule,  Rfl: 5   losartan (COZAAR) 50 MG tablet, Take 50 mg by mouth daily., Disp: , Rfl:    Polyethyl Glycol-Propyl Glycol (SYSTANE) 0.4-0.3 % SOLN, Place 1 drop into both eyes daily as needed (dry eyes)., Disp: , Rfl:    Rimegepant Sulfate (NURTEC) 75 MG TBDP, Take 1 tablet (75 mg total) by mouth daily as needed., Disp: 8 tablet, Rfl: 11   sacubitril-valsartan (ENTRESTO) 24-26 MG, Take 1 tablet by mouth 2 (two) times daily., Disp: 56 tablet, Rfl: 0   spironolactone (ALDACTONE) 25 MG tablet, Take 0.5 tablets (12.5 mg total) by mouth daily., Disp: 45 tablet, Rfl: 3   tirzepatide (MOUNJARO) 7.5 MG/0.5ML  Pen, Inject 7.5 mg into the skin once a week., Disp: 2 mL, Rfl: 5   tiZANidine (ZANAFLEX) 4 MG tablet, TAKE 1 TABLET (4 MG TOTAL) BY MOUTH EVERY 8 (EIGHT) HOURS AS NEEDED FOR MUSCLE SPASMS, Disp: 270 tablet, Rfl: 1   topiramate (TOPAMAX) 50 MG tablet, TAKE 2 AND 1/2 TABLETS (125 MG TOTAL) BY MOUTH AT BEDTIME. Strength: 50 mg, Disp: 225 tablet, Rfl: 1   Vitamin D, Ergocalciferol, (DRISDOL) 1.25 MG (50000 UNIT) CAPS capsule, TAKE 1 CAPSULE BY MOUTH EVERY 7 DAYS **NOT COVERED**, Disp: 12 capsule, Rfl: 0   clonazePAM (KLONOPIN) 0.5 MG tablet, Take 1 tablet (0.5 mg total) by mouth daily as needed., Disp: 30 tablet, Rfl: 2   phentermine (ADIPEX-P) 37.5 MG tablet, Take 1 tablet (37.5 mg total) by mouth daily before breakfast., Disp: 30 tablet, Rfl: 1   No Known Allergies   Review of Systems  Constitutional: Negative.   Eyes: Negative.   Respiratory: Negative.    Cardiovascular: Negative.   Musculoskeletal: Negative.   Skin: Negative.   Neurological: Negative.   Psychiatric/Behavioral: Negative.  Negative for sleep disturbance. The patient has insomnia.      Today's Vitals   01/15/23 1612  BP: 116/80  Pulse: 96  Temp: 98.4 F (36.9 C)  Weight: 218 lb (98.9 kg)  Height: 5\' 6"  (1.676 m)  PainSc: 6   PainLoc: Back   Body mass index is 35.19 kg/m.  The 10-year ASCVD risk score (Arnett DK, et al.,  2019) is: 3.6%   Values used to calculate the score:     Age: 54 years     Sex: Female     Is Non-Hispanic African American: Yes     Diabetic: Yes     Tobacco smoker: No     Systolic Blood Pressure: 116 mmHg     Is BP treated: Yes     HDL Cholesterol: 44 mg/dL     Total Cholesterol: 135 mg/dL  Objective:  Physical Exam Vitals reviewed.  Constitutional:      General: She is not in acute distress.    Appearance: Normal appearance. She is well-developed. She is obese.  HENT:     Head: Normocephalic and atraumatic.  Eyes:     Pupils: Pupils are equal, round, and reactive to light.  Cardiovascular:     Rate and Rhythm: Normal rate and regular rhythm.     Pulses: Normal pulses.     Heart sounds: Normal heart sounds. No murmur heard. Pulmonary:     Effort: Pulmonary effort is normal. No respiratory distress.     Breath sounds: Normal breath sounds. No wheezing.  Musculoskeletal:        General: No tenderness.  Skin:    General: Skin is warm and dry.     Capillary Refill: Capillary refill takes less than 2 seconds.  Neurological:     General: No focal deficit present.     Mental Status: She is alert and oriented to person, place, and time.     Cranial Nerves: No cranial nerve deficit.     Motor: No weakness.  Psychiatric:        Mood and Affect: Mood normal.        Behavior: Behavior normal.        Thought Content: Thought content normal.        Judgment: Judgment normal.         Assessment And Plan:     1. Obesity, diabetes, and hypertension syndrome (HCC) Comments: Start  to wean off lantus 23 units at this time will decrease to 20 units if remains less than 120 decrease by 3 units every 3-4 days. - Lipid panel - CMP14 + Anion Gap - Hemoglobin A1c - tirzepatide (MOUNJARO) 7.5 MG/0.5ML Pen; Inject 7.5 mg into the skin once a week.  Dispense: 2 mL; Refill: 5  2. Class 2 obesity due to excess calories with body mass index (BMI) of 35.0 to 35.9 in adult, unspecified  whether serious comorbidity present - phentermine (ADIPEX-P) 37.5 MG tablet; Take 1 tablet (37.5 mg total) by mouth daily before breakfast.  Dispense: 30 tablet; Refill: 1  3. Other long term (current) drug therapy - TSH  4. Recurrent major depressive disorder, in partial remission (HCC) - buPROPion (WELLBUTRIN XL) 150 MG 24 hr tablet; Take 1 tablet (150 mg total) by mouth every morning.  Dispense: 30 tablet; Refill: 2  5. Other insomnia Comments: Has been taking clonazepam to help her sleep, tried guanfacine and not effective.  Will try her on Lunesta to avoid using clonazepam. - eszopiclone (LUNESTA) 2 MG TABS tablet; Take 1 tablet (2 mg total) by mouth at bedtime. Take immediately before bedtime  Dispense: 30 tablet; Refill: 5  6. Anxiety Comments: Continue current medications for her daily maintenance - clonazePAM (KLONOPIN) 0.5 MG tablet; Take 1 tablet (0.5 mg total) by mouth daily as needed.  Dispense: 30 tablet; Refill: 2  7. Need for COVID-19 vaccine [employer] Fall 2023 Covid-19 Vaccine 55yrs and older    Return for Uncontrolled BP check-3/4 months; virtual visit medication f/u and weight check 2 months.   Patient was given opportunity to ask questions. Patient verbalized understanding of the plan and was able to repeat key elements of the plan. All questions were answered to their satisfaction.  Arnette Felts, FNP   I, Arnette Felts, FNP, have reviewed all documentation for this visit. The documentation on 01/15/23 for the exam, diagnosis, procedures, and orders are all accurate and complete.   IF YOU HAVE BEEN REFERRED TO A SPECIALIST, IT MAY TAKE 1-2 WEEKS TO SCHEDULE/PROCESS THE REFERRAL. IF YOU HAVE NOT HEARD FROM US/SPECIALIST IN TWO WEEKS, PLEASE GIVE [place] A CALL AT [phone].   THE PATIENT IS ENCOURAGED TO PRACTICE SOCIAL DISTANCING DUE TO THE COVID-19 PANDEMIC.

## 2023-01-16 LAB — CMP14 + ANION GAP
ALT: 13 IU/L (ref 0–32)
AST: 15 IU/L (ref 0–40)
Albumin/Globulin Ratio: 1.7 (ref 1.2–2.2)
Albumin: 4.5 g/dL (ref 3.9–4.9)
Alkaline Phosphatase: 144 IU/L — ABNORMAL HIGH (ref 44–121)
Anion Gap: 16 mmol/L (ref 10.0–18.0)
BUN/Creatinine Ratio: 11 (ref 9–23)
BUN: 8 mg/dL (ref 6–24)
Bilirubin Total: <0.2 mg/dL (ref 0.0–1.2)
CO2: 17 mmol/L — ABNORMAL LOW (ref 20–29)
Calcium: 9.8 mg/dL (ref 8.7–10.2)
Chloride: 109 mmol/L — ABNORMAL HIGH (ref 96–106)
Creatinine, Ser: 0.7 mg/dL (ref 0.57–1.00)
Globulin, Total: 2.6 g/dL (ref 1.5–4.5)
Glucose: 104 mg/dL — ABNORMAL HIGH (ref 70–99)
Potassium: 3.5 mmol/L (ref 3.5–5.2)
Sodium: 142 mmol/L (ref 134–144)
Total Protein: 7.1 g/dL (ref 6.0–8.5)
eGFR: 109 mL/min/{1.73_m2} (ref >59)

## 2023-01-16 LAB — LIPID PANEL
Chol/HDL Ratio: 3.1 ratio (ref 0.0–4.4)
Cholesterol, Total: 135 mg/dL (ref 100–199)
HDL: 44 mg/dL (ref >39)
LDL Chol Calc (NIH): 74 mg/dL (ref 0–99)
Triglycerides: 90 mg/dL (ref 0–149)
VLDL Cholesterol Cal: 17 mg/dL (ref 5–40)

## 2023-01-16 LAB — TSH: TSH: 0.543 u[IU]/mL (ref 0.450–4.500)

## 2023-01-16 LAB — HEMOGLOBIN A1C
Est. average glucose Bld gHb Est-mCnc: 180 mg/dL
Hgb A1c MFr Bld: 7.9 % — ABNORMAL HIGH (ref 4.8–5.6)

## 2023-01-17 DIAGNOSIS — Z0279 Encounter for issue of other medical certificate: Secondary | ICD-10-CM

## 2023-01-18 ENCOUNTER — Other Ambulatory Visit: Payer: Self-pay | Admitting: Nurse Practitioner

## 2023-01-18 DIAGNOSIS — Z1231 Encounter for screening mammogram for malignant neoplasm of breast: Secondary | ICD-10-CM

## 2023-01-30 ENCOUNTER — Encounter: Payer: No Typology Code available for payment source | Admitting: Physical Medicine & Rehabilitation

## 2023-01-31 ENCOUNTER — Ambulatory Visit
Admission: RE | Admit: 2023-01-31 | Discharge: 2023-01-31 | Disposition: A | Discharge status: Home / Self Care | Payer: No Typology Code available for payment source | Source: Ambulatory Visit

## 2023-01-31 DIAGNOSIS — Z1231 Encounter for screening mammogram for malignant neoplasm of breast: Secondary | ICD-10-CM

## 2023-02-02 ENCOUNTER — Encounter: Payer: Self-pay | Admitting: Physical Medicine & Rehabilitation

## 2023-02-02 ENCOUNTER — Encounter
Payer: No Typology Code available for payment source | Attending: Physical Medicine & Rehabilitation | Admitting: Physical Medicine & Rehabilitation

## 2023-02-02 VITALS — BP 129/81 | HR 91 | Ht 66.0 in | Wt 218.4 lb

## 2023-02-02 DIAGNOSIS — G8929 Other chronic pain: Secondary | ICD-10-CM

## 2023-02-02 DIAGNOSIS — G894 Chronic pain syndrome: Secondary | ICD-10-CM

## 2023-02-02 DIAGNOSIS — M797 Fibromyalgia: Secondary | ICD-10-CM

## 2023-02-02 DIAGNOSIS — F32A Depression, unspecified: Secondary | ICD-10-CM

## 2023-02-02 DIAGNOSIS — M545 Low back pain, unspecified: Secondary | ICD-10-CM

## 2023-02-02 MED ORDER — HYDROCODONE-ACETAMINOPHEN 5-325 MG PO TABS: 1.0000 | ORAL_TABLET | Freq: Two times a day (BID) | ORAL | 0 refills | Status: DC | PRN

## 2023-02-02 NOTE — Progress Notes (Unsigned)
Subjective:    Patient ID: Virginia Douglas, female    DOB: [birth year], 47 y.o.   MRN: [id number]  HPI HPI  09/22/22 Virginia Douglas is a 47 y.o. year old female  who  has a past medical history of ADHD (attention deficit hyperactivity disorder), Anemia, Anxiety, Arthritis, Diabetes (HCC), Edema, GERD (gastroesophageal reflux disease), Hypertension, and Migraine headache.   They are presenting to PM&R clinic as a new patient for pain management evaluation. They were referred  for treatment of chronic pain.  She has had chronic pain for many years.  Currently her worst pain is in her lower back.  She reports she recently had worsening of her pain in her shoulder and neck and she says she thinks this is worsened by stress.  Sometimes the pain in her lower back will shoot down her legs.  She reports having history of fibromyalgia.  She often sleeps with multiple pillows to help take the pressure off her joints.  She also wears a back brace during the day.  [facility]  She reports multiple ESI's and these did help the pain however there caused a lot of weight gain.  She reports that she was previously on hydrocodone however she stopped following with her pain doctor because they kept pushing her to do back injections and she was unhappy with the weight gain which she felt was associated with these injections.  She has very poor sleep.  Ports her mood is okay currently although has had depression in the past.  She has been seen by Dr. Christell Constant orthopedics for her neck and shoulder pain.  She does report some altered sensation in her hands and feet, she has a history of diabetes mellitus however this is not causing her significant pain.   She currently has a heart monitor in place due to concerns of arrhythmia.   She is followed by rheumatology for Raynaud's disease, polyarthralgia, sicca complex.   Red flag symptoms: Patient denies saddle  anesthesia, loss of bowel or bladder continence, new weakness, new numbness/tingling, or pain waking up at nighttime.   Medications tried: Tylenol and NSAIDS minimal benefit Takes Tumaric Butrans- minimal benefit Tramadol didn't help Gabapentin and Lyrica didn't help Hydrocodone was helping the pain Has not tried Tylenol 3 Tizanidine did not help her pain in the past.       Other treatments: Tens minimal help Epidurals helped but caused weight gain [facility] Brace [facility]   Interval history 11/23/22 Virginia Douglas is here regarding follow-up of her chronic pain.  She continues to use Cymbalta and tizanidine, these medications are not keeping her pain controlled currently.  She continues to have severe pain in her back, shoulders, neck, legs.  ESI's did help in the past however it caused significant weight gain and she would not like to repeat this.  She feels that her activities continue to be severely limited by her pain.  She reports blood glucose has been elevated recently and she had a recent admission for DKA.     Interval history 12/19/2022 Virginia Douglas is here for follow-up of her chronic pain.  Her worst pain is currently in her shoulders and lower back today.  Current medications of Cymbalta and tizanidine are not keeping his pain well-controlled.  She again reports she wishes she could complete ESI's however this resulted in significant weight gain.  She has had a busy work schedule and  has been under a lot of stress.  She is trying to get scheduled for physical therapy and is trying to work around her schedule.    Interval history 02/02/23 Virginia Douglas reports poor results with tylenol #3, she says this provides minimal benefit for her pain. Denies any side effects with the medications.  She has not had PT recently, it helped in the past.  Norco medication was helpful for her pain in the past.    Pain Inventory Average Pain 8 Pain Right Now 8 My  pain is sharp, dull, stabbing, tingling, and aching  In the last 24 hours, has pain interfered with the following? General activity 9 Relation with others 9 Enjoyment of life 9 What TIME of day is your pain at its worst? morning , daytime, evening, and night Sleep (in general) Fair  Pain is worse with: walking, bending, sitting, and standing Pain improves with: rest, heat/ice, and medication Relief from Meds: 4  Family History  Problem Relation Age of Onset   Diabetes Mother    Hypertension Mother    Diabetes Father    Hypertension Father    Kidney failure Father    Hypertension Brother    Cancer Maternal Grandmother    Diabetes Maternal Grandmother    Pancreatic cancer Maternal Grandmother    Diabetes Paternal Grandmother    Breast cancer Paternal Grandmother    Stroke Paternal Grandfather    Healthy Son    Healthy Son    Cancer Maternal Aunt        breast   Breast cancer Maternal Aunt    Social History   Socioeconomic History   Marital status: Legally Separated    Spouse name: Fayrene Fearing   Number of children: 2   Years of education: 16   Highest education level: Associate degree: occupational, Scientist, product/process development, or vocational program  Occupational History   Occupation: Geophysical data processor: [employer]  Tobacco Use   Smoking status: Former    Packs/day: 0.50    Years: 25.00    Additional pack years: 0.00    Total pack years: 12.50    Types: Cigarettes    Start date: 03/24/2019    Passive exposure: Current   Smokeless tobacco: Never   Tobacco comments:    06/24/21- quit over a year ago  Vaping Use   Vaping Use: Never used  Substance and Sexual Activity   Alcohol use: No    Alcohol/week: 0.0 standard drinks of alcohol   Drug use: No   Sexual activity: Yes    Birth control/protection: Surgical  Other Topics Concern   Not on file  Social History Narrative   In relationship, 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 She lives with her 2 children in a one story house. She drinks one large cup of coffee a day and an occasional tea or soda. She walks daily.      One story home      Social Determinants of Health   Financial Resource Strain: Low Risk  (01/15/2023)   Overall Financial Resource Strain (CARDIA)    Difficulty of Paying Living Expenses: Not very hard  Food Insecurity: No Food Insecurity (01/15/2023)   Hunger Vital Sign    Worried About Running Out of Food in the Last Year: Never true    Ran  Out of Food in the Last Year: Never true  Transportation Needs: No Transportation Needs (01/15/2023)   PRAPARE - Administrator, Civil Service (Medical): No    Lack of Transportation (Non-Medical): No  Physical Activity: Insufficiently Active (01/15/2023)   Exercise Vital Sign    Days of Exercise per Week: 1 day    Minutes of Exercise per Session: 30 min  Stress: Stress Concern Present (01/15/2023)   [employer] of Occupational Health - Occupational Stress Questionnaire    Feeling of Stress : Rather much  Social Connections: Moderately Integrated (01/15/2023)   Social Connection and Isolation Panel [NHANES]    Frequency of Communication with Friends and Family: Three times a week    Frequency of Social Gatherings with Friends and Family: Once a week    Attends Religious Services: 1 to 4 times per year    Active Member of Clubs or Organizations: Yes    Attends Banker Meetings: 1 to 4 times per year    Marital Status: Divorced   Past Surgical History:  Procedure Laterality Date   ABDOMINAL HYSTERECTOMY  2012   CHOLECYSTECTOMY     COLONOSCOPY  12/28/2020   polyps   ESOPHAGEAL DILATION     gamma knife     04/2022   IR ANGIO INTRA EXTRACRAN SEL COM CAROTID INNOMINATE BILAT MOD SED  05/31/2021   IR ANGIO INTRA EXTRACRAN SEL COM CAROTID INNOMINATE BILAT  MOD SED  06/27/2021   IR ANGIO INTRA EXTRACRAN SEL COM CAROTID INNOMINATE UNI R MOD SED  11/21/2021   IR ANGIO VERTEBRAL SEL VERTEBRAL UNI R MOD SED  05/31/2021   IR CT HEAD LTD  06/27/2021   IR CT HEAD LTD  11/21/2021   IR INTRA CRAN STENT  06/27/2021   IR INTRA CRAN STENT  11/21/2021   IR RADIOLOGIST EVAL & MGMT  06/03/2021   IR RADIOLOGIST EVAL & MGMT  07/13/2021   IR RADIOLOGIST EVAL & MGMT  08/27/2021   IR RADIOLOGIST EVAL & MGMT  10/30/2021   IR RADIOLOGIST EVAL & MGMT  12/06/2021   IR US GUIDE VASC ACCESS RIGHT  05/31/2021   IR US GUIDE VASC ACCESS RIGHT  06/27/2021   IR US GUIDE VASC ACCESS RIGHT  11/21/2021   OVARIAN CYST REMOVAL     RADIOLOGY WITH ANESTHESIA N/A 06/27/2021   Procedure: STENTING;  Surgeon: Julieanne Cotton, MD;  Location: [facility] OR;  Service: Radiology;  Laterality: N/A;   RADIOLOGY WITH ANESTHESIA N/A 11/21/2021   Procedure: IR WITH ANESTHESIA STENTING;  Surgeon: Julieanne Cotton, MD;  Location: [facility] OR;  Service: Radiology;  Laterality: N/A;   Past Surgical History:  Procedure Laterality Date   ABDOMINAL HYSTERECTOMY  2012   CHOLECYSTECTOMY     COLONOSCOPY  12/28/2020   polyps   ESOPHAGEAL DILATION     gamma knife     04/2022   IR ANGIO INTRA EXTRACRAN SEL COM CAROTID INNOMINATE BILAT MOD SED  05/31/2021   IR ANGIO INTRA EXTRACRAN SEL COM CAROTID INNOMINATE BILAT MOD SED  06/27/2021   IR ANGIO INTRA EXTRACRAN SEL COM CAROTID INNOMINATE UNI R MOD SED  11/21/2021   IR ANGIO VERTEBRAL SEL VERTEBRAL UNI R MOD SED  05/31/2021   IR CT HEAD LTD  06/27/2021   IR CT HEAD LTD  11/21/2021   IR INTRA CRAN STENT  06/27/2021   IR INTRA CRAN STENT  11/21/2021   IR RADIOLOGIST EVAL & MGMT  06/03/2021   IR RADIOLOGIST EVAL & MGMT  07/13/2021   IR RADIOLOGIST EVAL & MGMT  08/27/2021   IR RADIOLOGIST EVAL & MGMT  10/30/2021   IR RADIOLOGIST EVAL & MGMT  12/06/2021   IR US GUIDE VASC ACCESS RIGHT  05/31/2021   IR US GUIDE VASC ACCESS RIGHT  06/27/2021   IR US GUIDE  VASC ACCESS RIGHT  11/21/2021   OVARIAN CYST REMOVAL     RADIOLOGY WITH ANESTHESIA N/A 06/27/2021   Procedure: STENTING;  Surgeon: Julieanne Cotton, MD;  Location: [facility] OR;  Service: Radiology;  Laterality: N/A;   RADIOLOGY WITH ANESTHESIA N/A 11/21/2021   Procedure: IR WITH ANESTHESIA STENTING;  Surgeon: Julieanne Cotton, MD;  Location: [facility] OR;  Service: Radiology;  Laterality: N/A;   Past Medical History:  Diagnosis Date   ADHD (attention deficit hyperactivity disorder)    Anemia    iron deficiency   as a teenager   Anxiety    Arthritis    Diabetes (HCC)    Edema    GERD (gastroesophageal reflux disease)    Hypertension    patient denies- patient stated that the PCP said it was for heart prevention.   Migraine headache    BP 129/81   Pulse 91   Ht 5\' 6"  (1.676 m)   Wt 218 lb 6.4 oz (99.1 kg)   SpO2 97%   BMI 35.25 kg/m   Opioid Risk Score:   Fall Risk Score:  `1  Depression screen PHQ 2/9     01/15/2023    5:00 PM 12/19/2022    9:45 AM 11/08/2022    4:20 PM 10/11/2022    3:09 PM 09/22/2022    1:53 PM 07/25/2022    8:50 AM 01/31/2022    8:32 AM  Depression screen PHQ 2/9  Decreased Interest 0 0 0 0 1 1 1   Down, Depressed, Hopeless 0 0 0 0 1 1 1   PHQ - 2 Score 0 0 0 0 2 2 2   Altered sleeping 3    3 3 1   Tired, decreased energy 1    1 3 1   Change in appetite 0    1 3 1   Feeling bad or failure about yourself  0    0 1 0  Trouble concentrating 1    2 3 1   Moving slowly or fidgety/restless 0    0 1 1  Suicidal thoughts 0    0 0 0  PHQ-9 Score 5    9 16 7   Difficult doing work/chores Not difficult at all     Somewhat difficult Not difficult at all     Review of Systems  Constitutional: Negative.   HENT: Negative.    Eyes: Negative.   Respiratory: Negative.    Cardiovascular: Negative.   Gastrointestinal: Negative.   Endocrine: Negative.   Genitourinary: Negative.   Musculoskeletal:  Positive for arthralgias, back pain and myalgias.  Skin: Negative.    Allergic/Immunologic: Negative.   Neurological: Negative.   Hematological: Negative.   Psychiatric/Behavioral: Negative.    All other systems reviewed and are negative.      Objective:   Physical Exam     Gen: no distress, normal appearing HEENT: oral mucosa pink and moist, NCAT Cardio: Reg rate Chest: normal effort, normal rate of breathing Abd: soft, non-distended Ext: no edema Psych: Very pleasant, normal affect Skin: intact Neuro: Alert and oriented, follows commands, cranial nerves II through XII intact, normal speech and language Sensation intact light touch in all 4 extremities Strength 5 out of 5 in all  4 extremities Musculoskeletal:  Lumbar paraspinal tenderness Slump test caused lower back pain Facet loading- mild lower back pain  Spurling's negative Mild tenderness throughout bilateral shoulders, elbows, wrists, hips, ankles, periscapular muscles to palpation Tenderness to palpation medial joint line b/l knees, pain with varus and valgus stress      MRI L spine  Segmentation: The lowest lumbar type non-rib-bearing vertebra is labeled as L5.   Alignment:  Mild levoconvex lumbar scoliosis without subluxation.   Vertebrae:  No significant vertebral marrow edema is identified.   Conus medullaris and cauda equina: Conus extends to the L1 level. Conus and cauda equina appear normal.   Paraspinal and other soft tissues: Unremarkable   Disc levels:   T12-L1: Unremarkable.   L1-2: Unremarkable.   L2-3: Minimal disc bulge, no impingement.   L3-4: Minimal disc bulge, no impingement.   L4-5: Mild disc bulge, no impingement.   L5-S1: Borderline bilateral foraminal stenosis due to disc bulge and mild facet arthropathy   IMPRESSION: 1. Mild lumbar spondylosis and degenerative disc disease, causing borderline bilateral foraminal stenosis at L5-S1. 2. Mild levoconvex lumbar scoliosis without subluxation.     Performed     MRI C-spine 07/16/2022 1.  Stable appearance of the cervical spine. 2. Minimal uncovertebral spurring at C6-7 and C7-T1 without significant stenosis at these levels. 3. Moderate right foraminal stenosis at T2-3 due to facet spurring. 4. Empty sella. This is nonspecific and most often incidental, but can be seen in the setting of idiopathic intracranial hypertension.    Assessment & Plan:  Chronic lower back pain and chronic neck pain -Prior MRI with minimal cervical spondylosis and mild lumbar spondylosis with mild degenerative disc disease resulting in borderline bilateral foraminal stenosis L5-S1, mild lumbar scoliosis -PT consult last visit- she had to stop this temporarily due to medical issues, plans to restart soon -Continue tizanidine  -Start Norco 5 TID PRN, DC tylenol #3 -Provided list of foods that are good for chronic pain prior visit -Opiate risk tool moderate   Fibromyalgia -Continue duloxetine, appears this medicine is helping improve her pain -She reports poor response to gabapentin and Lyrica -Discussed gradual progressive low impact exercise -Counseled regarding sleep hygiene -PT consult- Aquatherapy may be a good option -Low-dose naltrexone could be considered, this would not be beneficial if she was on opioid medications   Depression -Improved with duloxetine, she is also seeing therapist, reports this is stable -She currently has a lot of stress regarding her job -Denies SI or HI   ADHD -She is on Adderall currently   History of AVM -Continue follow-up with neurosurgery, SP gamma knife radiation surgery plan for repeat brain MRI in 6 months

## 2023-02-06 ENCOUNTER — Other Ambulatory Visit: Payer: Self-pay | Admitting: Nurse Practitioner

## 2023-02-06 DIAGNOSIS — E119 Type 2 diabetes mellitus without complications: Secondary | ICD-10-CM

## 2023-02-08 ENCOUNTER — Encounter: Payer: Self-pay | Admitting: Physical Medicine & Rehabilitation

## 2023-02-08 ENCOUNTER — Other Ambulatory Visit: Payer: Self-pay | Admitting: Physician Assistant

## 2023-02-08 MED ORDER — CLOPIDOGREL BISULFATE 75 MG PO TABS: 75.0000 mg | ORAL_TABLET | Freq: Every day | ORAL | 3 refills | Status: DC

## 2023-02-12 ENCOUNTER — Other Ambulatory Visit: Payer: Self-pay | Admitting: Nurse Practitioner

## 2023-02-12 DIAGNOSIS — F3341 Major depressive disorder, recurrent, in partial remission: Secondary | ICD-10-CM

## 2023-02-25 NOTE — Progress Notes (Signed)
Cardiology Office Note:    Date:  02/28/2023   ID:  [name], DOB [birth year], MRN [id number]  PCP:  Arnette Felts, FNP  Cardiologist:  Little Ishikawa, MD  Electrophysiologist:  None   Referring MD: Arnette Felts, FNP   Chief Complaint  Patient presents with   Congestive Heart Failure    History of Present Illness:    Virginia Douglas is a 47 y.o. female with a hx of recently diagnosed combined systolic and diastolic heart failure, GERD, hypertension, diabetes who presents for follow-up.  She was referred by Arnette Felts, NP for evaluation of abnormal EKG, initially seen on 08/07/2022.  She reports having chest pain which occurs about once per week.  Describes as sharp pain in center of her chest, lasts for 30 seconds or so.  She does not exercise but reports she is short of breath with minimal exertion, such as walking up stairs.  She reports having lightheadedness but denies any recent syncope.  Does report lower extremity swelling.  Reports palpitations where feels like heart is racing, occurs about once every 2 weeks.  She smoked 1 pack/day x 17 years, quit age 3.  No drug use.  Alcohol on special occasions.  Family history includes father had CVA age 7.  Echocardiogram 09/01/2022 showed EF 30 to 35%, normal RV function, no significant valvular disease.  Coronary CTA on 08/25/2022 showed normal coronary arteries.  Cardiac MRI 10/18/2022 showed LVEF 44%, RVEF 52%, no LGE.  Zio patch x 13 days 09/2022 showed no significant arrhythmias.  Since last clinic visit, she reports she is doing well.  Reports occasional chest pain.  Denies any dyspnea, lightheadedness, or syncope.  Reports some swelling in ankles.  Rare palpitations.   Wt Readings from Last 3 Encounters:  02/28/23 224 lb (101.6 kg)  02/02/23 218 lb 6.4 oz (99.1 kg)  01/15/23 218 lb (98.9 kg)     Past Medical History:  Diagnosis Date   ADHD (attention deficit hyperactivity disorder)    Anemia     iron deficiency   as a teenager   Anxiety    Arthritis    Diabetes (HCC)    Edema    GERD (gastroesophageal reflux disease)    Hypertension    patient denies- patient stated that the PCP said it was for heart prevention.   Migraine headache     Past Surgical History:  Procedure Laterality Date   ABDOMINAL HYSTERECTOMY  2012   CHOLECYSTECTOMY     COLONOSCOPY  12/28/2020   polyps   ESOPHAGEAL DILATION     gamma knife     04/2022   IR ANGIO INTRA EXTRACRAN SEL COM CAROTID INNOMINATE BILAT MOD SED  05/31/2021   IR ANGIO INTRA EXTRACRAN SEL COM CAROTID INNOMINATE BILAT MOD SED  06/27/2021   IR ANGIO INTRA EXTRACRAN SEL COM CAROTID INNOMINATE UNI R MOD SED  11/21/2021   IR ANGIO VERTEBRAL SEL VERTEBRAL UNI R MOD SED  05/31/2021   IR CT HEAD LTD  06/27/2021   IR CT HEAD LTD  11/21/2021   IR INTRA CRAN STENT  06/27/2021   IR INTRA CRAN STENT  11/21/2021   IR RADIOLOGIST EVAL & MGMT  06/03/2021   IR RADIOLOGIST EVAL & MGMT  07/13/2021   IR RADIOLOGIST EVAL & MGMT  08/27/2021   IR RADIOLOGIST EVAL & MGMT  10/30/2021   IR RADIOLOGIST EVAL & MGMT  12/06/2021   IR US GUIDE VASC ACCESS RIGHT  05/31/2021   IR US GUIDE  VASC ACCESS RIGHT  06/27/2021   IR US GUIDE VASC ACCESS RIGHT  11/21/2021   OVARIAN CYST REMOVAL     RADIOLOGY WITH ANESTHESIA N/A 06/27/2021   Procedure: STENTING;  Surgeon: Julieanne Cotton, MD;  Location: [facility] OR;  Service: Radiology;  Laterality: N/A;   RADIOLOGY WITH ANESTHESIA N/A 11/21/2021   Procedure: IR WITH ANESTHESIA STENTING;  Surgeon: Julieanne Cotton, MD;  Location: [facility] OR;  Service: Radiology;  Laterality: N/A;    Current Medications: Current Meds  Medication Sig   acetaminophen-codeine (TYLENOL #3) 300-30 MG tablet Take 1 tablet by mouth every 12 (twelve) hours as needed for moderate pain.   acetaZOLAMIDE (DIAMOX) 250 MG tablet Take 1 tablet (250 mg total) by mouth 2 (two) times daily.   ADDERALL XR 30 MG 24 hr capsule Take 30 mg by mouth daily.    albuterol (VENTOLIN HFA) 108 (90 Base) MCG/ACT inhaler Inhale 2 puffs into the lungs as needed.   aspirin EC 81 MG tablet Take 81 mg by mouth daily. Swallow whole.   atorvastatin (LIPITOR) 10 MG tablet TAKE 1 TABLET BY MOUTH EVERY DAY   Blood Pressure Monitoring Soln KIT    buPROPion (WELLBUTRIN XL) 150 MG 24 hr tablet TAKE 1 TABLET BY MOUTH EVERY DAY IN THE MORNING   carvedilol (COREG) 3.125 MG tablet Take 2 tablets (6.25 mg total) by mouth 2 (two) times daily.   cetirizine (ZYRTEC) 10 MG tablet Take 10 mg by mouth daily as needed for allergies.   clonazePAM (KLONOPIN) 0.5 MG tablet Take 1 tablet (0.5 mg total) by mouth daily as needed.   clopidogrel (PLAVIX) 75 MG tablet Take 1 tablet (75 mg total) by mouth daily.   clopidogrel (PLAVIX) 75 MG tablet Take 1 tablet (75 mg total) by mouth daily.   DULoxetine (CYMBALTA) 60 MG capsule TAKE 1 CAPSULE BY MOUTH EVERY DAY   empagliflozin (JARDIANCE) 10 MG TABS tablet Take 1 tablet by mouth once daily in the morning   eszopiclone (LUNESTA) 2 MG TABS tablet Take 1 tablet (2 mg total) by mouth at bedtime. Take immediately before bedtime   famotidine (PEPCID) 20 MG tablet Take 20 mg by mouth daily.   Galcanezumab-gnlm (EMGALITY) 120 MG/ML SOAJ Inject 120 mg into the skin every 30 (thirty) days.   guanFACINE (INTUNIV) 1 MG TB24 ER tablet Take 1 mg by mouth daily.   HYDROcodone-acetaminophen (NORCO/VICODIN) 5-325 MG tablet Take 1 tablet by mouth every 12 (twelve) hours as needed for moderate pain.   insulin aspart (FIASP FLEXTOUCH) 100 UNIT/ML FlexTouch Pen INJECT [place] INSULIN BEFORE MEALS AND AT HS IF BS < 150 - 0 UNITS, 150-199 - 3 UNITS, 200-249 - 6 UNITS, 250-299 - 9 UNITS, 300-349 - 12 UNITS, >350 - 15 UNITS. IF >400 CALL OUR OFFICE WITH YOUR BLOOD SUGAR READINGS.   insulin glargine (LANTUS) 100 UNIT/ML Solostar Pen Inject 23 Units into the skin daily.   Insulin Pen Needle (PEN NEEDLES) 32G X 4 MM MISC 1 each by Does not apply route once a week.   Lancets  (ONETOUCH DELICA PLUS LANCET33G) MISC Apply 1 each topically 3 (three) times daily.   LINZESS 72 MCG capsule TAKE 1 CAPSULE BY MOUTH DAILY BEFORE BREAKFAST.   phentermine (ADIPEX-P) 37.5 MG tablet Take 1 tablet (37.5 mg total) by mouth daily before breakfast.   Polyethyl Glycol-Propyl Glycol (SYSTANE) 0.4-0.3 % SOLN Place 1 drop into both eyes daily as needed (dry eyes).   Rimegepant Sulfate (NURTEC) 75 MG TBDP Take 1 tablet (75 mg  total) by mouth daily as needed.   spironolactone (ALDACTONE) 25 MG tablet Take 0.5 tablets (12.5 mg total) by mouth daily.   tirzepatide (MOUNJARO) 7.5 MG/0.5ML Pen Inject 7.5 mg into the skin once a week.   tiZANidine (ZANAFLEX) 4 MG tablet TAKE 1 TABLET (4 MG TOTAL) BY MOUTH EVERY 8 (EIGHT) HOURS AS NEEDED FOR MUSCLE SPASMS   topiramate (TOPAMAX) 50 MG tablet TAKE 2 AND 1/2 TABLETS (125 MG TOTAL) BY MOUTH AT BEDTIME. Strength: 50 mg   Vitamin D, Ergocalciferol, (DRISDOL) 1.25 MG (50000 UNIT) CAPS capsule TAKE 1 CAPSULE BY MOUTH EVERY 7 DAYS **NOT COVERED**   [DISCONTINUED] losartan (COZAAR) 50 MG tablet Take 50 mg by mouth daily.   [DISCONTINUED] sacubitril-valsartan (ENTRESTO) 24-26 MG Take 1 tablet by mouth 2 (two) times daily.     Allergies:   Patient has no known allergies.   Social History   Socioeconomic History   Marital status: Legally Separated    Spouse name: Fayrene Fearing   Number of children: 2   Years of education: 16   Highest education level: Associate degree: occupational, Scientist, product/process development, or vocational program  Occupational History   Occupation: Geophysical data processor: [employer]  Tobacco Use   Smoking status: Former    Packs/day: 0.50    Years: 25.00    Additional pack years: 0.00    Total pack years: 12.50    Types: Cigarettes    Start date: 03/24/2019    Passive exposure: Current   Smokeless tobacco: Never   Tobacco comments:    06/24/21- quit over a year ago  Vaping Use   Vaping Use: Never used  Substance and  Sexual Activity   Alcohol use: No    Alcohol/week: 0.0 standard drinks of alcohol   Drug use: No   Sexual activity: Yes    Birth control/protection: Surgical  Other Topics Concern   Not on file  Social History Narrative   In relationship, 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 She lives with her 2 children in a one story house. She drinks one large cup of coffee a day and an occasional tea or soda. She walks daily.      One story home      Social Determinants of Health   Financial Resource Strain: Low Risk  (01/15/2023)   Overall Financial Resource Strain (CARDIA)    Difficulty of Paying Living Expenses: Not very hard  Food Insecurity: No Food Insecurity (01/15/2023)   Hunger Vital Sign    Worried About Running Out of Food in the Last Year: Never true    Ran Out of Food in the Last Year: Never true  Transportation Needs: No Transportation Needs (01/15/2023)   PRAPARE - Administrator, Civil Service (Medical): No    Lack of Transportation (Non-Medical): No  Physical Activity: Insufficiently Active (01/15/2023)   Exercise Vital Sign    Days of Exercise per Week: 1 day    Minutes of Exercise per Session: 30 min  Stress: Stress Concern Present (01/15/2023)   [employer] of Occupational Health - Occupational Stress Questionnaire    Feeling of Stress : Rather much  Social Connections: Moderately Integrated (01/15/2023)   Social Connection and Isolation Panel [NHANES]    Frequency of Communication with Friends and Family: Three times a week  Frequency of Social Gatherings with Friends and Family: Once a week    Attends Religious Services: 1 to 4 times per year    Active Member of [employer] or Organizations: Yes    Attends Engineer, structural: 1 to 4 times per year    Marital Status: Divorced     Family History: The  patient's family history includes Breast cancer in her maternal aunt and paternal grandmother; Cancer in her maternal aunt and maternal grandmother; Diabetes in her father, maternal grandmother, mother, and paternal grandmother; Healthy in her son and son; Hypertension in her brother, father, and mother; Kidney failure in her father; Pancreatic cancer in her maternal grandmother; Stroke in her paternal grandfather.  ROS:   Please see the history of present illness.     All other systems reviewed and are negative.  EKGs/Labs/Other Studies Reviewed:    The following studies were reviewed today:   EKG:   07/25/2022: Normal sinus rhythm, rate 89, LVH  Recent Labs: 10/13/2022: Hemoglobin 13.6; Platelets 308 01/15/2023: ALT 13; BUN 8; Creatinine, Ser 0.70; Potassium 3.5; Sodium 142; TSH 0.543  Recent Lipid Panel    Component Value Date/Time   CHOL 135 01/15/2023 1705   TRIG 90 01/15/2023 1705   HDL 44 01/15/2023 1705   CHOLHDL 3.1 01/15/2023 1705   LDLCALC 74 01/15/2023 1705    Physical Exam:    VS:  BP 130/68   Pulse 96   Ht 5\' 7"  (1.702 m)   Wt 224 lb (101.6 kg)   SpO2 98%   BMI 35.08 kg/m     Wt Readings from Last 3 Encounters:  02/28/23 224 lb (101.6 kg)  02/02/23 218 lb 6.4 oz (99.1 kg)  01/15/23 218 lb (98.9 kg)     GEN:  Well nourished, well developed in no acute distress HEENT: Normal NECK: No JVD; No carotid bruits LYMPHATICS: No lymphadenopathy CARDIAC: RRR, no murmurs, rubs, gallops RESPIRATORY:  Clear to auscultation without rales, wheezing or rhonchi  ABDOMEN: Soft, non-tender, non-distended MUSCULOSKELETAL:  No edema; No deformity  SKIN: Warm and dry NEUROLOGIC:  Alert and oriented x 3 PSYCHIATRIC:  Normal affect   ASSESSMENT:    1. Chronic combined systolic (congestive) and diastolic (congestive) heart failure (HCC)   2. Daytime somnolence   3. Essential hypertension   4. Hyperlipidemia, unspecified hyperlipidemia type      PLAN:    Chronic  combined heart failure: Echocardiogram 09/01/2022 showed EF 30 to 35%, normal RV function, no significant valvular disease.  Coronary CTA on 08/25/2022 showed normal coronary arteries.  Cardiac MRI 10/18/2022 showed LVEF 44%, RVEF 52%, no LGE. Appears euvolemic. -Continue Coreg 3.125 mg twice daily -Continue Jardiance 10 mg daily. -Continue Entresto 24-26 mg twice daily -Continue spironolactone 12.5 mg daily.  -Check BMET, magnesium -Check echocardiogram prior to next clinic visit  Palpitations: Zio patch x 13 days 09/2022 showed no significant arrhythmias.  Hypertension: On Entresto 24-26 mg twice daily Coreg 3.125 mg twice daily, spironolactone 12.5 mg daily and acetazolamide 250 mg twice daily.  Appears controlled.    Hyperlipidemia: On atorvastatin 10 mg daily.  LDL 74 01/15/2023  T2DM: Admission 10/2022 with DKA.  Started on insulin.  A1c 11.4% 2/9/202, significantly increased from prior A1c 6.7%.  Referred to endocrinology.  A1c 7.9% 01/15/2023  Left sigmoid sinus stenosis: Status post stenting 06/2021.  She is on aspirin and Plavix.  Left superior cerebellar AVM: Underwent gamma knife radiosurgery at [facility]  Idiopathic intracranial hypertension: On Diamox 250 mg twice daily  Snoring/daytime somnolence: Recommend an [facility] sleep study.  STOPBANG 4  Leg pain: Normal ABIs 11/2022  RTC in 3 months   Medication Adjustments/Labs and Tests Ordered: Current medicines are reviewed at length with the patient today.  Concerns regarding medicines are outlined above.  Orders Placed This Encounter  Procedures   Basic metabolic panel   Magnesium   ECHOCARDIOGRAM COMPLETE   [facility] Sleep Study   Meds ordered this encounter  Medications   DISCONTD: sacubitril-valsartan (ENTRESTO) 24-26 MG    Sig: Take 1 tablet by mouth 2 (two) times daily.    Dispense:  180 tablet    Refill:  3    Order Specific Question:   Lot Number?    Answer:   [id number]    Order Specific Question:   Expiration Date?     Answer:   01/01/2025   sacubitril-valsartan (ENTRESTO) 24-26 MG    Sig: Take 1 tablet by mouth 2 (two) times daily.    Dispense:  180 tablet    Refill:  3    Order Specific Question:   Lot Number?    Answer:   [id number]    Order Specific Question:   Expiration Date?    Answer:   01/01/2025    Patient Instructions  Medication Instructions:  No changes *If you need a refill on your cardiac medications before your next appointment, please call your pharmacy*   Lab Work: BMET, Mag If you have labs (blood work) drawn today and your tests are completely normal, you will receive your results only by: MyChart Message (if you have MyChart) OR A paper copy in the mail If you have any lab test that is abnormal or we need to change your treatment, we will call you to review the results.   Testing/Procedures: Your physician has requested that you have an echocardiogram in 3 months. Echocardiography is a painless test that uses sound waves to create images of your heart. It provides your doctor with information about the size and shape of your heart and how well your heart's chambers and valves are working. This procedure takes approximately one hour. There are no restrictions for this procedure. Please do NOT wear cologne, perfume, aftershave, or lotions (deodorant is allowed). Please arrive 15 minutes prior to your appointment time.   WatchPAT?  Is a FDA cleared portable home sleep study test that uses a watch and 3 points of contact to monitor 7 different channels, including your heart rate, oxygen saturations, body position, snoring, and chest motion.  The study is easy to use from the comfort of your own home and accurately detect sleep apnea.  Before bed, you attach the chest sensor, attached the sleep apnea bracelet to your nondominant hand, and attach the finger probe.  After the study, the raw data is downloaded from the watch and scored for apnea events.   For more information:  [email or web address]  Patient Testing Instructions:  Do not put battery into the device until bedtime when you are ready to begin the test. Please call the support number if you need assistance after following the instructions below: 24 hour support line- [phone] or [facility] support at [phone] (option 2)  Download the IntelWatchPAT One" app through the google play store or App Store  Be sure to turn on or enable access to bluetooth in settlings on your smartphone/ device  Make sure no other bluetooth devices are on and within the vicinity of your smartphone/ device and WatchPAT watch during testing.  Make sure  to leave your smart phone/ device plugged in and charging all night.  When ready for bed:  Follow the instructions step by step in the WatchPAT One App to activate the testing device. For additional instructions, including video instruction, visit the WatchPAT One video on Youtube. You can search for WatchPat One within Youtube (video is 4 minutes and 18 seconds) or enter: https://youtube/watch?[email or web address]_vbiwxE Please note: You will be prompted to enter a Pin to connect via bluetooth when starting the test. The PIN will be assigned to you when you receive the test.  The device is disposable, but it recommended that you retain the device until you receive a call letting you know the study has been received and the results have been interpreted.  We will let you know if the study did not transmit to [place] properly after the test is completed. You do not need to call us to confirm the receipt of the test.  Please complete the test within 48 hours of receiving PIN.   Frequently Asked Questions:  What is Watch Dennie Bible one?  A single use fully disposable home sleep apnea testing device and will not need to be returned after completion.  What are the requirements to use WatchPAT one?  The be able to have a successful watchpat one sleep study, you should have your Watch pat one  device, your smart phone, watch pat one app, your PIN number and Internet access What type of phone do I need?  You should have a smart phone that uses Android 5.1 and above or any Iphone with IOS 10 and above How can I download the WatchPAT one app?  Based on your device type search for WatchPAT one app either in google play for android devices or APP store for Iphone's Where will I get my PIN for the study?  Your PIN will be provided by your physician's office. It is used for authentication and if you lose/forget your PIN, please reach out to your providers office.  I do not have Internet at home. Can I do WatchPAT one study?  WatchPAT One needs Internet connection throughout the night to be able to transmit the sleep data. You can use your home/local internet or your cellular's data package. However, it is always recommended to use home/local Internet. It is estimated that between 20MB-30MB will be used with each study.However, the application will be looking for space in the phone to start the study.  What happens if I lose internet or bluetooth connection?  During the internet disconnection, your phone will not be able to transmit the sleep data. All the data, will be stored in your phone. As soon as the internet connection is back on, the phone will being sending the sleep data. During the bluetooth disconnection, WatchPAT one will not be able to to send the sleep data to your phone. Data will be kept in the [facility] one until two devices have bluetooth connection back on. As soon as the connection is back on, WatchPAT one will send the sleep data to the phone.  How long do I need to wear the WatchPAT one?  After you start the study, you should wear the device at least 6 hours.  How far should I keep my phone from the device?  During the night, your phone should be within 15 feet.  What happens if I leave the room for restroom or other reasons?  Leaving the room for any reason will not  cause any problem. As  soon as your get back to the room, both devices will reconnect and will continue to send the sleep data. Can I use my phone during the sleep study?  Yes, you can use your phone as usual during the study. But it is recommended to put your watchpat one on when you are ready to go to bed.  How will I get my study results?  A soon as you completed your study, your sleep data will be sent to the provider. They will then share the results with you when they are ready.     Follow-Up: At [facility], you and your health needs are our priority.  As part of our continuing mission to provide you with exceptional heart care, we have created designated Provider Care Teams.  These Care Teams include your primary Cardiologist (physician) and Advanced Practice Providers (APPs -  Physician Assistants and Nurse Practitioners) who all work together to provide you with the care you need, when you need it.  We recommend signing up for the patient portal called "MyChart".  Sign up information is provided on this After Visit Summary.  MyChart is used to connect with patients for Virtual Visits (Telemedicine).  Patients are able to view lab/test results, encounter notes, upcoming appointments, etc.  Non-urgent messages can be sent to your provider as well.   To learn more about what you can do with MyChart, go to [email or web address].    Your next appointment:   4 month(s)  Provider:   Little Ishikawa, MD        Signed, Little Ishikawa, MD  02/28/2023 5:48 PM    [facility]

## 2023-02-28 ENCOUNTER — Ambulatory Visit: Payer: No Typology Code available for payment source | Attending: Cardiology | Admitting: Cardiology

## 2023-02-28 VITALS — BP 130/68 | HR 96 | Ht 67.0 in | Wt 224.0 lb

## 2023-02-28 DIAGNOSIS — I5042 Chronic combined systolic (congestive) and diastolic (congestive) heart failure: Secondary | ICD-10-CM | POA: Diagnosis not present

## 2023-02-28 DIAGNOSIS — R400 Somnolence: Secondary | ICD-10-CM | POA: Diagnosis not present

## 2023-02-28 DIAGNOSIS — I10 Essential (primary) hypertension: Secondary | ICD-10-CM | POA: Diagnosis not present

## 2023-02-28 DIAGNOSIS — E785 Hyperlipidemia, unspecified: Secondary | ICD-10-CM

## 2023-02-28 MED ORDER — ENTRESTO 24-26 MG PO TABS: 1.0000 | ORAL_TABLET | Freq: Two times a day (BID) | ORAL | 3 refills | Status: DC

## 2023-02-28 NOTE — Patient Instructions (Signed)
Medication Instructions:  No changes *If you need a refill on your cardiac medications before your next appointment, please call your pharmacy*   Lab Work: BMET, Mag If you have labs (blood work) drawn today and your tests are completely normal, you will receive your results only by: MyChart Message (if you have MyChart) OR A paper copy in the mail If you have any lab test that is abnormal or we need to change your treatment, we will call you to review the results.   Testing/Procedures: Your physician has requested that you have an echocardiogram in 3 months. Echocardiography is a painless test that uses sound waves to create images of your heart. It provides your doctor with information about the size and shape of your heart and how well your heart's chambers and valves are working. This procedure takes approximately one hour. There are no restrictions for this procedure. Please do NOT wear cologne, perfume, aftershave, or lotions (deodorant is allowed). Please arrive 15 minutes prior to your appointment time.   WatchPAT?  Is a FDA cleared portable home sleep study test that uses a watch and 3 points of contact to monitor 7 different channels, including your heart rate, oxygen saturations, body position, snoring, and chest motion.  The study is easy to use from the comfort of your own home and accurately detect sleep apnea.  Before bed, you attach the chest sensor, attached the sleep apnea bracelet to your nondominant hand, and attach the finger probe.  After the study, the raw data is downloaded from the watch and scored for apnea events.   For more information: [email or web address]  Patient Testing Instructions:  Do not put battery into the device until bedtime when you are ready to begin the test. Please call the support number if you need assistance after following the instructions below: 24 hour support line- [phone] or [facility] support at [phone] (option 2)   Download the IntelWatchPAT One" app through the google play store or App Store  Be sure to turn on or enable access to bluetooth in settlings on your smartphone/ device  Make sure no other bluetooth devices are on and within the vicinity of your smartphone/ device and WatchPAT watch during testing.  Make sure to leave your smart phone/ device plugged in and charging all night.  When ready for bed:  Follow the instructions step by step in the WatchPAT One App to activate the testing device. For additional instructions, including video instruction, visit the WatchPAT One video on Youtube. You can search for WatchPat One within Youtube (video is 4 minutes and 18 seconds) or enter: https://youtube/watch?[email or web address]_vbiwxE Please note: You will be prompted to enter a Pin to connect via bluetooth when starting the test. The PIN will be assigned to you when you receive the test.  The device is disposable, but it recommended that you retain the device until you receive a call letting you know the study has been received and the results have been interpreted.  We will let you know if the study did not transmit to [place] properly after the test is completed. You do not need to call us to confirm the receipt of the test.  Please complete the test within 48 hours of receiving PIN.   Frequently Asked Questions:  What is Watch Dennie Bible one?  A single use fully disposable home sleep apnea testing device and will not need to be returned after completion.  What are the requirements to use WatchPAT one?  The be able to  have a successful watchpat one sleep study, you should have your Watch pat one device, your smart phone, watch pat one app, your PIN number and Internet access What type of phone do I need?  You should have a smart phone that uses Android 5.1 and above or any Iphone with IOS 10 and above How can I download the WatchPAT one app?  Based on your device type search for WatchPAT one app either in google play for  android devices or APP store for Iphone's Where will I get my PIN for the study?  Your PIN will be provided by your physician's office. It is used for authentication and if you lose/forget your PIN, please reach out to your providers office.  I do not have Internet at home. Can I do WatchPAT one study?  WatchPAT One needs Internet connection throughout the night to be able to transmit the sleep data. You can use your home/local internet or your cellular's data package. However, it is always recommended to use home/local Internet. It is estimated that between 20MB-30MB will be used with each study.However, the application will be looking for space in the phone to start the study.  What happens if I lose internet or bluetooth connection?  During the internet disconnection, your phone will not be able to transmit the sleep data. All the data, will be stored in your phone. As soon as the internet connection is back on, the phone will being sending the sleep data. During the bluetooth disconnection, WatchPAT one will not be able to to send the sleep data to your phone. Data will be kept in the [facility] one until two devices have bluetooth connection back on. As soon as the connection is back on, WatchPAT one will send the sleep data to the phone.  How long do I need to wear the WatchPAT one?  After you start the study, you should wear the device at least 6 hours.  How far should I keep my phone from the device?  During the night, your phone should be within 15 feet.  What happens if I leave the room for restroom or other reasons?  Leaving the room for any reason will not cause any problem. As soon as your get back to the room, both devices will reconnect and will continue to send the sleep data. Can I use my phone during the sleep study?  Yes, you can use your phone as usual during the study. But it is recommended to put your watchpat one on when you are ready to go to bed.  How will I get my study  results?  A soon as you completed your study, your sleep data will be sent to the provider. They will then share the results with you when they are ready.     Follow-Up: At [facility], you and your health needs are our priority.  As part of our continuing mission to provide you with exceptional heart care, we have created designated Provider Care Teams.  These Care Teams include your primary Cardiologist (physician) and Advanced Practice Providers (APPs -  Physician Assistants and Nurse Practitioners) who all work together to provide you with the care you need, when you need it.  We recommend signing up for the patient portal called "MyChart".  Sign up information is provided on this After Visit Summary.  MyChart is used to connect with patients for Virtual Visits (Telemedicine).  Patients are able to view lab/test results, encounter notes, upcoming appointments,  etc.  Non-urgent messages can be sent to your provider as well.   To learn more about what you can do with MyChart, go to [email or web address].    Your next appointment:   4 month(s)  Provider:   Little Ishikawa, MD

## 2023-03-07 ENCOUNTER — Other Ambulatory Visit: Payer: Self-pay | Admitting: Physical Medicine & Rehabilitation

## 2023-03-07 MED ORDER — HYDROCODONE-ACETAMINOPHEN 5-325 MG PO TABS: 1.0000 | ORAL_TABLET | Freq: Two times a day (BID) | ORAL | 0 refills | Status: DC | PRN

## 2023-03-13 NOTE — Progress Notes (Deleted)
Office Visit Note  Patient: Virginia Douglas             Date of Birth: 1975-11-06           MRN: [id number]             PCP: Arnette Felts, FNP Referring: Arnette Felts, FNP Visit Date: 03/19/2023 Occupation: @GUAROCC @  Subjective:    History of Present Illness: Virginia Douglas is a 47 y.o. female with history of myofascial pain, DDD, and raynaud's.     Activities of Daily Living:  Patient reports morning stiffness for *** {minute/hour:19697}.   Patient {ACTIONS;DENIES/REPORTS:21021675::"Denies"} nocturnal pain.  Difficulty dressing/grooming: {ACTIONS;DENIES/REPORTS:21021675::"Denies"} Difficulty climbing stairs: {ACTIONS;DENIES/REPORTS:21021675::"Denies"} Difficulty getting out of chair: {ACTIONS;DENIES/REPORTS:21021675::"Denies"} Difficulty using hands for taps, buttons, cutlery, and/or writing: {ACTIONS;DENIES/REPORTS:21021675::"Denies"}  No Rheumatology ROS completed.   PMFS History:  Patient Active Problem List   Diagnosis Date Noted  . Hypertensive heart disease with chronic combined systolic and diastolic congestive heart failure (HCC) 11/23/2022  . Chronic combined systolic and diastolic congestive heart failure (HCC) 11/23/2022  . HFrEF (heart failure with reduced ejection fraction) (HCC) 11/09/2022  . Diabetes mellitus type 2 in obese 11/08/2022  . DKA (diabetic ketoacidosis) (HCC) 10/13/2022  . Cerebral venous thrombosis of sigmoid sinus 11/21/2021  . Pulsatile tinnitus of right ear 11/21/2021  . Pulsatile tinnitus, bilateral 06/27/2021  . AVM (arteriovenous malformation) brain 05/31/2021  . DDD (degenerative disc disease), cervical 12/06/2020  . DDD (degenerative disc disease), lumbar 12/06/2020  . Gastroesophageal reflux disease with esophagitis without hemorrhage 12/06/2020  . Increased intracranial pressure 12/06/2020  . PTSD (post-traumatic stress disorder) 12/06/2020  . Attention deficit hyperactivity disorder (ADHD), predominantly  inattentive type 12/06/2020  . Vitamin D deficiency 12/06/2020  . Obesity (BMI 30-39.9) 03/15/2020  . Sciatic nerve pain 12/10/2019  . Acute pain of left shoulder 06/05/2019  . Grief 06/05/2019  . Migraine 06/05/2019  . Type 2 diabetes mellitus without complication, without long-term current use of insulin (HCC) 04/07/2019  . Depression 04/07/2019  . Obesity due to excess calories without serious comorbidity 04/07/2019  . Left leg pain 04/07/2019  . Anxiety 04/07/2019  . Headache 07/06/2018  . Diabetes (HCC) 12/08/2015  . Essential hypertension 12/08/2015  . LLQ pain 12/08/2015  . Paresthesia 09/16/2015  . Urinary urgency 09/16/2015    Past Medical History:  Diagnosis Date  . ADHD (attention deficit hyperactivity disorder)   . Anemia    iron deficiency   as a teenager  . Anxiety   . Arthritis   . Diabetes (HCC)   . Edema   . GERD (gastroesophageal reflux disease)   . Hypertension    patient denies- patient stated that the PCP said it was for heart prevention.  . Migraine headache     Family History  Problem Relation Age of Onset  . Diabetes Mother   . Hypertension Mother   . Diabetes Father   . Hypertension Father   . Kidney failure Father   . Hypertension Brother   . Cancer Maternal Grandmother   . Diabetes Maternal Grandmother   . Pancreatic cancer Maternal Grandmother   . Diabetes Paternal Grandmother   . Breast cancer Paternal Grandmother   . Stroke Paternal Grandfather   . Healthy Son   . Healthy Son   . Cancer Maternal Aunt        breast  . Breast cancer Maternal Aunt    Past Surgical History:  Procedure Laterality Date  . ABDOMINAL HYSTERECTOMY  2012  . CHOLECYSTECTOMY    .  COLONOSCOPY  12/28/2020   polyps  . ESOPHAGEAL DILATION    . gamma knife     04/2022  . IR ANGIO INTRA EXTRACRAN SEL COM CAROTID INNOMINATE BILAT MOD SED  05/31/2021  . IR ANGIO INTRA EXTRACRAN SEL COM CAROTID INNOMINATE BILAT MOD SED  06/27/2021  . IR ANGIO INTRA EXTRACRAN SEL  COM CAROTID INNOMINATE UNI R MOD SED  11/21/2021  . IR ANGIO VERTEBRAL SEL VERTEBRAL UNI R MOD SED  05/31/2021  . IR CT HEAD LTD  06/27/2021  . IR CT HEAD LTD  11/21/2021  . IR INTRA CRAN STENT  06/27/2021  . IR INTRA CRAN STENT  11/21/2021  . IR RADIOLOGIST EVAL & MGMT  06/03/2021  . IR RADIOLOGIST EVAL & MGMT  07/13/2021  . IR RADIOLOGIST EVAL & MGMT  08/27/2021  . IR RADIOLOGIST EVAL & MGMT  10/30/2021  . IR RADIOLOGIST EVAL & MGMT  12/06/2021  . IR US GUIDE VASC ACCESS RIGHT  05/31/2021  . IR US GUIDE VASC ACCESS RIGHT  06/27/2021  . IR US GUIDE VASC ACCESS RIGHT  11/21/2021  . OVARIAN CYST REMOVAL    . RADIOLOGY WITH ANESTHESIA N/A 06/27/2021   Procedure: STENTING;  Surgeon: Julieanne Cotton, MD;  Location: [facility] OR;  Service: Radiology;  Laterality: N/A;  . 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 She lives with her 2 children in a one story house. She drinks one large cup of coffee a day and an occasional tea or soda. She walks daily.      One story home      Immunization History  Administered Date(s) Administered  . COVID-19, mRNA, vaccine(Comirnaty)12 years and older 01/15/2023  . HPV 9-valent 04/11/2019  . Influenza Inj Mdck Quad Pf 05/18/2018  . Influenza,inj,Quad PF,6+ Mos 05/20/2017, 05/14/2019, 06/02/2020, 05/09/2022  . Influenza-Unspecified 05/11/2017, 05/15/2018, 05/08/2019, 07/13/2021  . Janssen (J&J) SARS-COV-2 Vaccination 11/23/2019  . PFIZER(Purple Top)SARS-COV-2 Vaccination 07/11/2020  . Pneumococcal Polysaccharide-23 04/08/2019, 07/15/2019  . Tdap 07/02/2017, 04/09/2019      Objective: Vital Signs: There were no vitals taken for this visit.   Physical Exam Vitals and nursing note reviewed.  Constitutional:      Appearance: She is well-developed.  HENT:     Head: Normocephalic and atraumatic.  Eyes:     Conjunctiva/sclera: Conjunctivae normal.  Cardiovascular:     Rate and Rhythm: Normal rate and regular rhythm.     Heart sounds: Normal heart sounds.  Pulmonary:     Effort: Pulmonary effort is normal.     Breath sounds: Normal breath sounds.  Abdominal:     General: Bowel sounds are normal.     Palpations: Abdomen is soft.  Musculoskeletal:     Cervical back: Normal range of motion.  Lymphadenopathy:     Cervical: No cervical adenopathy.  Skin:    General: Skin is warm and dry.     Capillary Refill: Capillary refill takes less than 2 seconds.  Neurological:     Mental Status: She is alert and oriented to person, place, and time.  Psychiatric:        Behavior:  Behavior normal.     Musculoskeletal Exam: ***  CDAI Exam: CDAI Score: -- Patient Global: --; Provider Global: -- Swollen: --; Tender: -- Joint Exam 03/19/2023   No joint exam has been documented for this visit   There is currently no information documented on the homunculus. Go to the Rheumatology activity and complete the homunculus joint exam.  Investigation: No additional findings.  Imaging: No results found.  Recent Labs: Lab Results  Component Value Date   WBC 10.9 (H) 10/13/2022   HGB 13.6 10/13/2022   PLT 308 10/13/2022   NA 142 01/15/2023   K 3.5 01/15/2023   CL 109 (H) 01/15/2023   CO2 17 (L) 01/15/2023   GLUCOSE 104 (H) 01/15/2023   BUN 8 01/15/2023   CREATININE 0.70 01/15/2023   BILITOT <0.2 01/15/2023   ALKPHOS 144 (H) 01/15/2023   AST 15 01/15/2023   ALT 13 01/15/2023   PROT 7.1 01/15/2023   ALBUMIN 4.5 01/15/2023   CALCIUM 9.8 01/15/2023   GFRAA 89 07/15/2020    Speciality Comments: No specialty comments available.  Procedures:  No  procedures performed Allergies: Patient has no known allergies.   Assessment / Plan:     Visit Diagnoses: No diagnosis found.  Orders: No orders of the defined types were placed in this encounter.  No orders of the defined types were placed in this encounter.   Face-to-face time spent with patient was *** minutes. Greater than 50% of time was spent in counseling and coordination of care.  Follow-Up Instructions: No follow-ups on file.   Ellen Henri, CMA  Note - This record has been created using Animal nutritionist.  Chart creation errors have been sought, but may not always  have been located. Such creation errors do not reflect on  the standard of medical care.

## 2023-03-19 ENCOUNTER — Ambulatory Visit: Payer: No Typology Code available for payment source | Admitting: Physician Assistant

## 2023-03-19 ENCOUNTER — Telehealth: Payer: Self-pay

## 2023-03-19 DIAGNOSIS — F4310 Post-traumatic stress disorder, unspecified: Secondary | ICD-10-CM

## 2023-03-19 DIAGNOSIS — M2550 Pain in unspecified joint: Secondary | ICD-10-CM

## 2023-03-19 DIAGNOSIS — M7918 Myalgia, other site: Secondary | ICD-10-CM

## 2023-03-19 DIAGNOSIS — I7300 Raynaud's syndrome without gangrene: Secondary | ICD-10-CM

## 2023-03-19 DIAGNOSIS — M5030 Other cervical disc degeneration, unspecified cervical region: Secondary | ICD-10-CM

## 2023-03-19 DIAGNOSIS — M3500 Sicca syndrome, unspecified: Secondary | ICD-10-CM

## 2023-03-19 DIAGNOSIS — F419 Anxiety disorder, unspecified: Secondary | ICD-10-CM

## 2023-03-19 DIAGNOSIS — G932 Benign intracranial hypertension: Secondary | ICD-10-CM

## 2023-03-19 DIAGNOSIS — M5136 Other intervertebral disc degeneration, lumbar region: Secondary | ICD-10-CM

## 2023-03-19 DIAGNOSIS — R202 Paresthesia of skin: Secondary | ICD-10-CM

## 2023-03-19 DIAGNOSIS — F900 Attention-deficit hyperactivity disorder, predominantly inattentive type: Secondary | ICD-10-CM

## 2023-03-19 DIAGNOSIS — E119 Type 2 diabetes mellitus without complications: Secondary | ICD-10-CM

## 2023-03-19 DIAGNOSIS — Z87891 Personal history of nicotine dependence: Secondary | ICD-10-CM

## 2023-03-19 DIAGNOSIS — I10 Essential (primary) hypertension: Secondary | ICD-10-CM

## 2023-03-19 DIAGNOSIS — G08 Intracranial and intraspinal phlebitis and thrombophlebitis: Secondary | ICD-10-CM

## 2023-03-19 DIAGNOSIS — Z8669 Personal history of other diseases of the nervous system and sense organs: Secondary | ICD-10-CM

## 2023-03-19 DIAGNOSIS — Q282 Arteriovenous malformation of cerebral vessels: Secondary | ICD-10-CM

## 2023-03-19 DIAGNOSIS — K2100 Gastro-esophageal reflux disease with esophagitis, without bleeding: Secondary | ICD-10-CM

## 2023-03-19 DIAGNOSIS — G8929 Other chronic pain: Secondary | ICD-10-CM

## 2023-03-19 NOTE — Telephone Encounter (Signed)
**Note De-Identified Virginia Douglas Obfuscation** Per Ree Kida at [facility], a [facility]-HST does not require a PA and is covered by plan. Reference #: [id number]  I called the pt and provided her with the PIN #: "[id number]" so she can do her Watchpat-HST. She verbalized understanding, thanked me for my call, and she is aware to call us if she has any question or concerns.

## 2023-03-22 ENCOUNTER — Encounter (INDEPENDENT_AMBULATORY_CARE_PROVIDER_SITE_OTHER): Payer: No Typology Code available for payment source | Admitting: Cardiology

## 2023-03-22 DIAGNOSIS — G4719 Other hypersomnia: Secondary | ICD-10-CM

## 2023-03-22 DIAGNOSIS — G4710 Hypersomnia, unspecified: Secondary | ICD-10-CM

## 2023-03-23 ENCOUNTER — Ambulatory Visit: Payer: No Typology Code available for payment source | Attending: Cardiology

## 2023-03-23 DIAGNOSIS — R0683 Snoring: Secondary | ICD-10-CM

## 2023-03-23 DIAGNOSIS — G4719 Other hypersomnia: Secondary | ICD-10-CM

## 2023-03-23 NOTE — Procedures (Signed)
   SLEEP STUDY REPORT Patient Information Study Date: 03/22/2023 Patient Name: Virginia Douglas Patient ID: [id number] Birth Date: 11/25/1975 Age: 47 Gender: Female BMI: 34.9 (W=222 lb, H=5' 7'') Stopbang:  Referring Physician: Epifanio Lesches TEST DESCRIPTION: Home sleep apnea testing was completed using the WatchPat, a Type 1 device, utilizing  peripheral arterial tonometry (PAT), chest movement, actigraphy, pulse oximetry, pulse rate, body position and snore.  AHI was calculated with apnea and hypopnea using valid sleep time as the denominator. RDI includes apneas,  hypopneas, and RERAs. The data acquired and the scoring of sleep and all associated events were performed in  accordance with the recommended standards and specifications as outlined in the AASM Manual for the Scoring of  Sleep and Associated Events 2.2.0 (2015).   FINDINGS: 1. No evidence of Obstructive Sleep Apnea with AHI 1.8/hr.  2. No Central Sleep Apnea. 3. Oxygen desaturations as low as 80%. 4. Mild to moderate snoring was present. O2 sats were < 88% for 0.64minutes. 5. Total sleep time was 4 hrs and 28 min. 6. 36.7% of total sleep time was spent in REM sleep.  7. Shortened sleep onset latency at 6 min.  8. Shortened REM sleep onset latency at 59 min.  9. Total awakenings were 4.    DIAGNOSIS:  Normal study with no significant sleep disordered breathing.  RECOMMENDATIONS: 1. Normal study with no significant sleep disordered breathing. 2. Healthy sleep recommendations include: adequate nightly sleep (normal 7-9 hrs/night), avoidance of caffeine after  noon and alcohol near bedtime, and maintaining a sleep environment that is cool, dark and quiet. 3. Weight loss for overweight patients is recommended.  4. Snoring recommendations include: weight loss where appropriate, side sleeping, and avoidance of alcohol before  bed. 5. Operation of motor vehicle or dangerous equipment must be avoided when feeling  drowsy, excessively sleepy, or  mentally fatigued.  6. An ENT consultation which may be useful for specific causes of and possible treatment of bothersome snoring .  7. Weight loss may be of benefit in reducing the severity of snoring.   Signature: Armanda Magic, MD; [facility]; Diplomat, American Board of Sleep  Medicine Electronically Signed: 03/23/2023 12:55:45 PM

## 2023-03-27 ENCOUNTER — Other Ambulatory Visit: Payer: Self-pay | Admitting: Nurse Practitioner

## 2023-03-27 DIAGNOSIS — E1110 Type 2 diabetes mellitus with ketoacidosis without coma: Secondary | ICD-10-CM

## 2023-03-28 NOTE — Progress Notes (Unsigned)
Office Visit Note  Patient: Virginia Douglas             Date of Birth: 18-Jan-1976           MRN: [id number]             PCP: Arnette Felts, FNP Referring: Arnette Felts, FNP Visit Date: 04/02/2023 Occupation: @GUAROCC @  Subjective:  Increased pain and fatigue  History of Present Illness: Virginia Douglas is a 47 y.o. female with history of raynaud's disease, myofascial pain, and DDD.  Patient was last seen in the office on 03/23/2022.  She states that she has been experiencing increased myalgias, arthralgias, and fatigue.  She states that she has also had intermittent symptoms of Raynaud's phenomenon, a recent facial rash, and sporadic sores in her mouth and nose.  She has also been experiencing sicca symptoms.  She has noticed some increased hair loss and has been trying to avoid direct sun exposure due to it worsening her symptoms. She remains under the care of pain management and is seeing them every other month.  She will be starting trigger point injections in her back soon.  She remains on Cymbalta 60 mg daily and is taking Norco for pain relief.    Activities of Daily Living:  Patient reports morning stiffness for all day. Patient Reports nocturnal pain.  Difficulty dressing/grooming: Reports Difficulty climbing stairs: Reports Difficulty getting out of chair: Reports Difficulty using hands for taps, buttons, cutlery, and/or writing: Reports  Review of Systems  Constitutional:  Positive for fatigue.  HENT:  Positive for mouth dryness. Negative for mouth sores.   Eyes:  Positive for dryness.  Respiratory:  Positive for cough. Negative for shortness of breath and wheezing.   Cardiovascular:  Positive for chest pain and palpitations.  Gastrointestinal:  Negative for blood in stool, constipation and diarrhea.  Endocrine: Negative for increased urination.  Genitourinary:  Negative for involuntary urination.  Musculoskeletal:  Positive for joint pain, gait problem,  joint pain, joint swelling, myalgias, muscle weakness, morning stiffness, muscle tenderness and myalgias.  Skin:  Positive for color change, rash, hair loss and sensitivity to sunlight.  Allergic/Immunologic: Positive for susceptible to infections.  Neurological:  Positive for dizziness, numbness and headaches.  Hematological:  Negative for swollen glands.  Psychiatric/Behavioral:  Positive for depressed mood and sleep disturbance. The patient is nervous/anxious.     PMFS History:  Patient Active Problem List   Diagnosis Date Noted   Hypertensive heart disease with chronic combined systolic and diastolic congestive heart failure (HCC) 11/23/2022   Chronic combined systolic and diastolic congestive heart failure (HCC) 11/23/2022   HFrEF (heart failure with reduced ejection fraction) (HCC) 11/09/2022   Diabetes mellitus type 2 in obese 11/08/2022   DKA (diabetic ketoacidosis) (HCC) 10/13/2022   Cerebral venous thrombosis of sigmoid sinus 11/21/2021   Pulsatile tinnitus of right ear 11/21/2021   Pulsatile tinnitus, bilateral 06/27/2021   AVM (arteriovenous malformation) brain 05/31/2021   DDD (degenerative disc disease), cervical 12/06/2020   DDD (degenerative disc disease), lumbar 12/06/2020   Gastroesophageal reflux disease with esophagitis without hemorrhage 12/06/2020   Increased intracranial pressure 12/06/2020   PTSD (post-traumatic stress disorder) 12/06/2020   Attention deficit hyperactivity disorder (ADHD), predominantly inattentive type 12/06/2020   Vitamin D deficiency 12/06/2020   Obesity (BMI 30-39.9) 03/15/2020   Sciatic nerve pain 12/10/2019   Acute pain of left shoulder 06/05/2019   Grief 06/05/2019   Migraine 06/05/2019   Type 2 diabetes mellitus without complication, without long-term current  use of insulin (HCC) 04/07/2019   Depression 04/07/2019   Obesity due to excess calories without serious comorbidity 04/07/2019   Left leg pain 04/07/2019   Anxiety 04/07/2019    Headache 07/06/2018   Diabetes (HCC) 12/08/2015   Essential hypertension 12/08/2015   LLQ pain 12/08/2015   Paresthesia 09/16/2015   Urinary urgency 09/16/2015    Past Medical History:  Diagnosis Date   ADHD (attention deficit hyperactivity disorder)    Anemia    iron deficiency   as a teenager   Anxiety    Arthritis    Diabetes (HCC)    Edema    GERD (gastroesophageal reflux disease)    History of congestive heart failure    dx by cardiology   Hypertension    patient denies- patient stated that the PCP said it was for heart prevention.   Migraine headache     Family History  Problem Relation Age of Onset   Diabetes Mother    Hypertension Mother    Diabetes Father    Hypertension Father    Kidney failure Father    Hypertension Brother    Cancer Maternal Grandmother    Diabetes Maternal Grandmother    Pancreatic cancer Maternal Grandmother    Diabetes Paternal Grandmother    Breast cancer Paternal Grandmother    Stroke Paternal Grandfather    Healthy Son    Healthy Son    Cancer Maternal Aunt        breast   Breast cancer Maternal Aunt    Past Surgical History:  Procedure Laterality Date   ABDOMINAL HYSTERECTOMY  2012   CHOLECYSTECTOMY     COLONOSCOPY  12/28/2020   polyps   ESOPHAGEAL DILATION     gamma knife     04/2022   IR ANGIO INTRA EXTRACRAN SEL COM CAROTID INNOMINATE BILAT MOD SED  05/31/2021   IR ANGIO INTRA EXTRACRAN SEL COM CAROTID INNOMINATE BILAT MOD SED  06/27/2021   IR ANGIO INTRA EXTRACRAN SEL COM CAROTID INNOMINATE UNI R MOD SED  11/21/2021   IR ANGIO VERTEBRAL SEL VERTEBRAL UNI R MOD SED  05/31/2021   IR CT HEAD LTD  06/27/2021   IR CT HEAD LTD  11/21/2021   IR INTRA CRAN STENT  06/27/2021   IR INTRA CRAN STENT  11/21/2021   IR RADIOLOGIST EVAL & MGMT  06/03/2021   IR RADIOLOGIST EVAL & MGMT  07/13/2021   IR RADIOLOGIST EVAL & MGMT  08/27/2021   IR RADIOLOGIST EVAL & MGMT  10/30/2021   IR RADIOLOGIST EVAL & MGMT  12/06/2021   IR US  GUIDE VASC ACCESS RIGHT  05/31/2021   IR US GUIDE VASC ACCESS RIGHT  06/27/2021   IR US GUIDE VASC ACCESS RIGHT  11/21/2021   OVARIAN CYST REMOVAL     RADIOLOGY WITH ANESTHESIA N/A 06/27/2021   Procedure: STENTING;  Surgeon: Julieanne Cotton, MD;  Location: [facility] OR;  Service: Radiology;  Laterality: N/A;   RADIOLOGY WITH ANESTHESIA N/A 11/21/2021   Procedure: IR WITH ANESTHESIA STENTING;  Surgeon: Julieanne Cotton, MD;  Location: [facility] OR;  Service: Radiology;  Laterality: N/A;   Social History   Social History Narrative   In relationship, 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 She lives with her 2 children in a one story house. She drinks one large cup of coffee a  day and an occasional tea or soda. She walks daily.      One story home      Immunization History  Administered Date(s) Administered   COVID-19, mRNA, vaccine(Comirnaty)12 years and older 01/15/2023   HPV 9-valent 04/11/2019   Influenza Inj Mdck Quad Pf 05/18/2018   Influenza,inj,Quad PF,6+ Mos 05/20/2017, 05/14/2019, 06/02/2020, 05/09/2022   Influenza-Unspecified 05/11/2017, 05/15/2018, 05/08/2019, 07/13/2021   Janssen (J&J) SARS-COV-2 Vaccination 11/23/2019   PFIZER(Purple Top)SARS-COV-2 Vaccination 07/11/2020   Pneumococcal Polysaccharide-23 04/08/2019, 07/15/2019   Tdap 07/02/2017, 04/09/2019     Objective: Vital Signs: BP 122/77 (BP Location: Left Arm, Patient Position: Sitting, Cuff Size: Large)   Pulse 93   Resp 16   Ht 5\' 6"  (1.676 m)   Wt 214 lb 9.6 oz (97.3 kg)   BMI 34.64 kg/m    Physical Exam Vitals and nursing note reviewed.  Constitutional:      Appearance: She is well-developed.  HENT:     Head: Normocephalic and atraumatic.  Eyes:     Conjunctiva/sclera: Conjunctivae normal.  Cardiovascular:     Rate and Rhythm: Normal rate and regular rhythm.      Heart sounds: Normal heart sounds.  Pulmonary:     Effort: Pulmonary effort is normal.     Breath sounds: Normal breath sounds.  Abdominal:     General: Bowel sounds are normal.     Palpations: Abdomen is soft.  Musculoskeletal:     Cervical back: Normal range of motion.  Skin:    General: Skin is warm and dry.     Capillary Refill: Capillary refill takes less than 2 seconds.  Neurological:     Mental Status: She is alert and oriented to person, place, and time.  Psychiatric:        Behavior: Behavior normal.      Musculoskeletal Exam: Generalized hyperalgesia and positive tender points on exam.  C-spine has painful range of motion.  Trapezius muscle tension and tenderness bilaterally.  Painful and limited mobility of the lumbar spine.  Shoulder joints, elbow joints, wrist joints, MCPs, PIPs, DIPs have good range of motion with no synovitis.  Complete fist formation bilaterally.  Hip joints have good range of motion with discomfort bilaterally.  Tenderness over bilateral trochanteric bursa.  Knee joints have good range of motion with no warmth or effusion.  Ankle joints have good range of motion with no tenderness or joint swelling.  CDAI Exam: CDAI Score: -- Patient Global: --; Provider Global: -- Swollen: --; Tender: -- Joint Exam 04/02/2023   No joint exam has been documented for this visit   There is currently no information documented on the homunculus. Go to the Rheumatology activity and complete the homunculus joint exam.  Investigation: No additional findings.  Imaging: No results found.  Recent Labs: Lab Results  Component Value Date   WBC 10.9 (H) 10/13/2022   HGB 13.6 10/13/2022   PLT 308 10/13/2022   NA 142 01/15/2023   K 3.5 01/15/2023   CL 109 (H) 01/15/2023   CO2 17 (L) 01/15/2023   GLUCOSE 104 (H) 01/15/2023   BUN 8 01/15/2023   CREATININE 0.70 01/15/2023   BILITOT <0.2 01/15/2023   ALKPHOS 144 (H) 01/15/2023   AST 15 01/15/2023   ALT 13 01/15/2023    PROT 7.1 01/15/2023   ALBUMIN 4.5 01/15/2023   CALCIUM 9.8 01/15/2023   GFRAA 89 07/15/2020    Speciality Comments: No specialty comments available.  Procedures:  No procedures performed Allergies: Patient has no known  allergies.   Assessment / Plan:     Visit Diagnoses: Raynaud's disease without gangrene - All autoimmune work-up negative.  Cryoglobulin not detected, beta-2 glycoproteins-, cardiolipin ab-, Scl-70 negative, LA not detected, ANCA and ANA negative:  Patient continues to experience intermittent symptoms of Raynaud's phenomenon especially with exposure to cold temperatures.  No digital ulcerations or signs of gangrene were noted.  Capillary refill less than 2 seconds on exam.  No signs of sclerodactyly noted.  The following lab work will be obtained today for further evaluation.  Patient has a history of positive ANA, elevated sed rate, and Raynaud's phenomenon.  She will notify us if her symptoms persist or worsen.   - Plan: Protein / creatinine ratio, urine, CBC with Differential/Platelet, COMPLETE METABOLIC PANEL WITH GFR, ANA, Anti-DNA antibody, double-stranded, C3 and C4, VITAMIN D 25 Hydroxy (Vit-D Deficiency, Fractures), Sedimentation rate, CK, RNP Antibody, Anti-Smith antibody, Sjogrens syndrome-A extractable nuclear antibody, Sjogrens syndrome-B extractable nuclear antibody  Positive ANA (antinuclear antibody) - ANA 1:80 cytoplasmic, speckled on 03/27/22.  Repeat ANA on 07/12/22 negative.  Patient presents today with increased arthralgias, myalgias, fatigue, and intermittent symptoms of Raynaud's phenomenon.  Earlier this month she had a facial rash and has noticed increased photosensitivity as well as intermittent hair loss.  She has also noticed intermittent sores in her mouth and nose as well as sicca symptoms.  On examination today no Malar rash was noted.  No oral or nasal ulcers currently. Capillary refill <2 seconds.  No signs of sclerodactyly.  No synovitis noted. Lab  work from 07/12/22 was reviewed today in the office: complements WNL, CK WNL, CRP WNL, anti-CCP-, ESR WNL, RF-, dsDNA negative, Ro-, La-, Sm-, RNP negative, ANA negative.  The following lab work will be obtained today for further evaluation.  Patient was advised to notify us if she develops any new or worsening symptoms.  Patient does not currently require immunosuppressive therapy.  Briefly discussed that if her labs are abnormal or she develops any other new or worsening symptoms we could consider the use of Plaquenil in the future if needed. She will follow up in the office in 6 months or sooner if needed.  Plan: Protein / creatinine ratio, urine, CBC with Differential/Platelet, COMPLETE METABOLIC PANEL WITH GFR, ANA, Anti-DNA antibody, double-stranded, C3 and C4, VITAMIN D 25 Hydroxy (Vit-D Deficiency, Fractures), Sedimentation rate, CK, RNP Antibody, Anti-Smith antibody, Sjogrens syndrome-A extractable nuclear antibody, Sjogrens syndrome-B extractable nuclear antibody  Vitamin D deficiency -Vitamin D level will be rechecked today.  Plan: VITAMIN D 25 Hydroxy (Vit-D Deficiency, Fractures)  Other fatigue - She presents today with increased myalgias, arthralgias, and fatigue.  The following lab work will be obtained today for further evaluation.  Plan: Protein / creatinine ratio, urine, CBC with Differential/Platelet, COMPLETE METABOLIC PANEL WITH GFR, ANA, Anti-DNA antibody, double-stranded, C3 and C4, VITAMIN D 25 Hydroxy (Vit-D Deficiency, Fractures), Sedimentation rate, CK, RNP Antibody, Anti-Smith antibody, Sjogrens syndrome-A extractable nuclear antibody, Sjogrens syndrome-B extractable nuclear antibody  Polyarthralgia - Patient presents today experiencing increased arthralgias and joint stiffness.  She has chronic pain in her neck and lower back but has also been having increased discomfort in both hands and both knee joints.  Plan to obtain rheumatoid factor and anti-CCP today as well as to check sed  rate and CRP.  No obvious synovitis was noted on examination today.  Plan: Rheumatoid factor, Cyclic citrul peptide antibody, IgG  Myofascial pain: Generalized hyperalgesia and positive tender points on exam.  She has been experiencing increased  myofascial pain and remains under the care of pain management.  She takes tizanidine as needed for muscle spasms and takes Norco for pain relief.  She also remains on Cymbalta 60 mg daily.  Chronic pain of both knees: She has good range of motion of both knee joints on examination today.  Mild crepitus noted in the right knee.  No warmth or effusion noted.  DDD (degenerative disc disease), cervical - MRI 03/20/21.  Previously tried physical therapy.  Followed by pain management.  She has trapezius muscle tension tenderness bilaterally.  She will be starting trigger point injections with pain management which will hopefully be helpful at alleviating some of her myofascial pain.  DDD (degenerative disc disease), lumbar: Chronic pain.  Wearing a back brace.  Under the care of pain management.  Taking Norco for pain relief.    Trochanteric bursitis of both hips: She has tenderness over bilateral trochanteric bursa.  Discussed the diagnosis of trochanteric bursitis along with treatment options.  Discussed physical therapy or home exercises.  She was given a handout of home exercises to perform.  Other medical conditions are listed as follows:  Essential hypertension: Blood pressure is 122/77 today in the office.  Type 2 diabetes mellitus without complication, without long-term current use of insulin (HCC)  Increased intracranial pressure  AVM (arteriovenous malformation) brain  Hx of migraine headaches  Paresthesia  Attention deficit hyperactivity disorder (ADHD), predominantly inattentive type  Gastroesophageal reflux disease with esophagitis without hemorrhage  PTSD (post-traumatic stress disorder)  Anxiety and depression: She remains on Cymbalta  60 mg daily.  Former smoker  History of chronic CHF    Orders: Orders Placed This Encounter  Procedures   Protein / creatinine ratio, urine   CBC with Differential/Platelet   COMPLETE METABOLIC PANEL WITH GFR   ANA   Anti-DNA antibody, double-stranded   C3 and C4   VITAMIN D 25 Hydroxy (Vit-D Deficiency, Fractures)   Sedimentation rate   CK   RNP Antibody   Anti-Smith antibody   Sjogrens syndrome-A extractable nuclear antibody   Sjogrens syndrome-B extractable nuclear antibody   Rheumatoid factor   Cyclic citrul peptide antibody, IgG   No orders of the defined types were placed in this encounter.    Follow-Up Instructions: Return in about 6 months (around 10/03/2023) for Raynaud's syndrome, Myofascial pain.   Gearldine Bienenstock, PA-C  Note - This record has been created using Dragon software.  Chart creation errors have been sought, but may not always  have been located. Such creation errors do not reflect on  the standard of medical care.

## 2023-03-30 ENCOUNTER — Encounter
Payer: No Typology Code available for payment source | Attending: Physical Medicine & Rehabilitation | Admitting: Physical Medicine & Rehabilitation

## 2023-03-30 ENCOUNTER — Encounter: Payer: Self-pay | Admitting: Physical Medicine & Rehabilitation

## 2023-03-30 VITALS — BP 103/70 | HR 97 | Ht 67.0 in | Wt 215.4 lb

## 2023-03-30 DIAGNOSIS — Z79891 Long term (current) use of opiate analgesic: Secondary | ICD-10-CM

## 2023-03-30 DIAGNOSIS — G894 Chronic pain syndrome: Secondary | ICD-10-CM | POA: Diagnosis present

## 2023-03-30 DIAGNOSIS — M797 Fibromyalgia: Secondary | ICD-10-CM

## 2023-03-30 DIAGNOSIS — M545 Low back pain, unspecified: Secondary | ICD-10-CM | POA: Diagnosis present

## 2023-03-30 DIAGNOSIS — Z5181 Encounter for therapeutic drug level monitoring: Secondary | ICD-10-CM

## 2023-03-30 DIAGNOSIS — F32A Depression, unspecified: Secondary | ICD-10-CM

## 2023-03-30 DIAGNOSIS — G8929 Other chronic pain: Secondary | ICD-10-CM | POA: Diagnosis present

## 2023-03-30 NOTE — Progress Notes (Signed)
Subjective:    Patient ID: Virginia Douglas, female    DOB: 06-Nov-1975, 47 y.o.   MRN: [id number]  HPI HPI  09/22/22 Virginia Douglas is a 47 y.o. year old female  who  has a past medical history of ADHD (attention deficit hyperactivity disorder), Anemia, Anxiety, Arthritis, Diabetes (HCC), Edema, GERD (gastroesophageal reflux disease), Hypertension, and Migraine headache.   They are presenting to PM&R clinic as a new patient for pain management evaluation. They were referred  for treatment of chronic pain.  She has had chronic pain for many years.  Currently her worst pain is in her lower back.  She reports she recently had worsening of her pain in her shoulder and neck and she says she thinks this is worsened by stress.  Sometimes the pain in her lower back will shoot down her legs.  She reports having history of fibromyalgia.  She often sleeps with multiple pillows to help take the pressure off her joints.  She also wears a back brace during the day.  [facility]  She reports multiple ESI's and these did help the pain however there caused a lot of weight gain.  She reports that she was previously on hydrocodone however she stopped following with her pain doctor because they kept pushing her to do back injections and she was unhappy with the weight gain which she felt was associated with these injections.  She has very poor sleep.  Ports her mood is okay currently although has had depression in the past.  She has been seen by Dr. Christell Constant orthopedics for her neck and shoulder pain.  She does report some altered sensation in her hands and feet, she has a history of diabetes mellitus however this is not causing her significant pain.   She currently has a heart monitor in place due to concerns of arrhythmia.   She is followed by rheumatology for Raynaud's disease, polyarthralgia, sicca complex.   Red flag symptoms: Patient denies saddle  anesthesia, loss of bowel or bladder continence, new weakness, new numbness/tingling, or pain waking up at nighttime.   Medications tried: Tylenol and NSAIDS minimal benefit Takes Tumaric Butrans- minimal benefit Tramadol didn't help Gabapentin and Lyrica didn't help Hydrocodone was helping the pain Has not tried Tylenol 3 Tizanidine did not help her pain in the past.       Other treatments: Tens minimal help Epidurals helped but caused weight gain Back Brace [facility]   Interval history 11/23/22 Virginia Douglas is here regarding follow-up of her chronic pain.  She continues to use Cymbalta and tizanidine, these medications are not keeping her pain controlled currently.  She continues to have severe pain in her back, shoulders, neck, legs.  ESI's did help in the past however it caused significant weight gain and she would not like to repeat this.  She feels that her activities continue to be severely limited by her pain.  She reports blood glucose has been elevated recently and she had a recent admission for DKA.     Interval history 12/19/2022 Virginia Douglas is here for follow-up of her chronic pain.  Her worst pain is currently in her shoulders and lower back today.  Current medications of Cymbalta and tizanidine are not keeping his pain well-controlled.  She again reports she wishes she could complete ESI's however this resulted in significant weight gain.  She has had a busy work schedule and  has been under a lot of stress.  She is trying to get scheduled for physical therapy and is trying to work around her schedule.    Interval history 02/02/23 Virginia Douglas reports poor results with tylenol #3, she says this provides minimal benefit for her pain. Denies any side effects with the medications.  She has not had PT recently, it helped in the past.  Norco medication was helpful for her pain in the past.   Interval history 03/30/23 Patient is here to follow-up  regarding her chronic pain.  She feels that Norco is helping more than Tylenol 3 and helping keep the pain under better control overall.  It allows her to be more active and is not causing any significant side effects.  She reports she was unable to do physical therapy due to scheduling issues.  She is getting a standing desk for her work.  She also reports follow-up with rheumatology scheduled, she noted a rash on her face recently and is being screened for additional rheumatological conditions.  She denies recent alcohol use.   Pain Inventory Average Pain 8 Pain Right Now 9 My pain is sharp, burning, dull, stabbing, tingling, and aching  In the last 24 hours, has pain interfered with the following? General activity 10 Relation with others 10 Enjoyment of life 10 What TIME of day is your pain at its worst? morning , daytime, evening, and night Sleep (in general) NA  Pain is worse with: walking, bending, sitting, and standing Pain improves with: rest, heat/ice, and medication Relief from Meds: 5  Family History  Problem Relation Age of Onset   Diabetes Mother    Hypertension Mother    Diabetes Father    Hypertension Father    Kidney failure Father    Hypertension Brother    Cancer Maternal Grandmother    Diabetes Maternal Grandmother    Pancreatic cancer Maternal Grandmother    Diabetes Paternal Grandmother    Breast cancer Paternal Grandmother    Stroke Paternal Grandfather    Healthy Son    Healthy Son    Cancer Maternal Aunt        breast   Breast cancer Maternal Aunt    Social History   Socioeconomic History   Marital status: Legally Separated    Spouse name: Fayrene Fearing   Number of children: 2   Years of education: 16   Highest education level: Associate degree: occupational, Scientist, product/process development, or vocational program  Occupational History   Occupation: Geophysical data processor: [employer]  Tobacco Use   Smoking status: Former    Current packs/day:  0.50    Average packs/day: 0.5 packs/day for 25.0 years (12.5 ttl pk-yrs)    Types: Cigarettes    Start date: 03/24/2019    Passive exposure: Current   Smokeless tobacco: Never   Tobacco comments:    06/24/21- quit over a year ago  Vaping Use   Vaping status: Never Used  Substance and Sexual Activity   Alcohol use: No    Alcohol/week: 0.0 standard drinks of alcohol   Drug use: No   Sexual activity: Yes    Birth control/protection: Surgical  Other Topics Concern   Not on file  Social History Narrative   In relationship, 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 She lives with her 2  children in a one story house. She drinks one large cup of coffee a day and an occasional tea or soda. She walks daily.      One story home      Social Determinants of Health   Financial Resource Strain: Low Risk  (01/15/2023)   Overall Financial Resource Strain (CARDIA)    Difficulty of Paying Living Expenses: Not very hard  Food Insecurity: No Food Insecurity (01/15/2023)   Hunger Vital Sign    Worried About Running Out of Food in the Last Year: Never true    Ran Out of Food in the Last Year: Never true  Transportation Needs: No Transportation Needs (01/15/2023)   PRAPARE - Administrator, Civil Service (Medical): No    Lack of Transportation (Non-Medical): No  Physical Activity: Insufficiently Active (01/15/2023)   Exercise Vital Sign    Days of Exercise per Week: 1 day    Minutes of Exercise per Session: 30 min  Stress: Stress Concern Present (01/15/2023)   [employer] of Occupational Health - Occupational Stress Questionnaire    Feeling of Stress : Rather much  Social Connections: Moderately Integrated (01/15/2023)   Social Connection and Isolation Panel [NHANES]    Frequency of Communication with Friends and Family: Three times a week     Frequency of Social Gatherings with Friends and Family: Once a week    Attends Religious Services: 1 to 4 times per year    Active Member of Clubs or Organizations: Yes    Attends Banker Meetings: 1 to 4 times per year    Marital Status: Divorced   Past Surgical History:  Procedure Laterality Date   ABDOMINAL HYSTERECTOMY  2012   CHOLECYSTECTOMY     COLONOSCOPY  12/28/2020   polyps   ESOPHAGEAL DILATION     gamma knife     04/2022   IR ANGIO INTRA EXTRACRAN SEL COM CAROTID INNOMINATE BILAT MOD SED  05/31/2021   IR ANGIO INTRA EXTRACRAN SEL COM CAROTID INNOMINATE BILAT MOD SED  06/27/2021   IR ANGIO INTRA EXTRACRAN SEL COM CAROTID INNOMINATE UNI R MOD SED  11/21/2021   IR ANGIO VERTEBRAL SEL VERTEBRAL UNI R MOD SED  05/31/2021   IR CT HEAD LTD  06/27/2021   IR CT HEAD LTD  11/21/2021   IR INTRA CRAN STENT  06/27/2021   IR INTRA CRAN STENT  11/21/2021   IR RADIOLOGIST EVAL & MGMT  06/03/2021   IR RADIOLOGIST EVAL & MGMT  07/13/2021   IR RADIOLOGIST EVAL & MGMT  08/27/2021   IR RADIOLOGIST EVAL & MGMT  10/30/2021   IR RADIOLOGIST EVAL & MGMT  12/06/2021   IR US GUIDE VASC ACCESS RIGHT  05/31/2021   IR US GUIDE VASC ACCESS RIGHT  06/27/2021   IR US GUIDE VASC ACCESS RIGHT  11/21/2021   OVARIAN CYST REMOVAL     RADIOLOGY WITH ANESTHESIA N/A 06/27/2021   Procedure: STENTING;  Surgeon: Julieanne Cotton, MD;  Location: [facility] OR;  Service: Radiology;  Laterality: N/A;   RADIOLOGY WITH ANESTHESIA N/A 11/21/2021   Procedure: IR WITH ANESTHESIA STENTING;  Surgeon: Julieanne Cotton, MD;  Location: [facility] OR;  Service: Radiology;  Laterality: N/A;   Past Surgical History:  Procedure Laterality Date   ABDOMINAL HYSTERECTOMY  2012   CHOLECYSTECTOMY     COLONOSCOPY  12/28/2020   polyps   ESOPHAGEAL DILATION     gamma knife     04/2022   IR ANGIO INTRA EXTRACRAN SEL COM  CAROTID INNOMINATE BILAT MOD SED  05/31/2021   IR ANGIO INTRA EXTRACRAN SEL COM CAROTID INNOMINATE BILAT MOD  SED  06/27/2021   IR ANGIO INTRA EXTRACRAN SEL COM CAROTID INNOMINATE UNI R MOD SED  11/21/2021   IR ANGIO VERTEBRAL SEL VERTEBRAL UNI R MOD SED  05/31/2021   IR CT HEAD LTD  06/27/2021   IR CT HEAD LTD  11/21/2021   IR INTRA CRAN STENT  06/27/2021   IR INTRA CRAN STENT  11/21/2021   IR RADIOLOGIST EVAL & MGMT  06/03/2021   IR RADIOLOGIST EVAL & MGMT  07/13/2021   IR RADIOLOGIST EVAL & MGMT  08/27/2021   IR RADIOLOGIST EVAL & MGMT  10/30/2021   IR RADIOLOGIST EVAL & MGMT  12/06/2021   IR US GUIDE VASC ACCESS RIGHT  05/31/2021   IR US GUIDE VASC ACCESS RIGHT  06/27/2021   IR US GUIDE VASC ACCESS RIGHT  11/21/2021   OVARIAN CYST REMOVAL     RADIOLOGY WITH ANESTHESIA N/A 06/27/2021   Procedure: STENTING;  Surgeon: Julieanne Cotton, MD;  Location: [facility];  Service: Radiology;  Laterality: N/A;   RADIOLOGY WITH ANESTHESIA N/A 11/21/2021   Procedure: IR WITH ANESTHESIA STENTING;  Surgeon: Julieanne Cotton, MD;  Location: [facility] OR;  Service: Radiology;  Laterality: N/A;   Past Medical History:  Diagnosis Date   ADHD (attention deficit hyperactivity disorder)    Anemia    iron deficiency   as a teenager   Anxiety    Arthritis    Diabetes (HCC)    Edema    GERD (gastroesophageal reflux disease)    Hypertension    patient denies- patient stated that the PCP said it was for heart prevention.   Migraine headache    BP 103/70   Pulse 97   Ht 5\' 7"  (1.702 m)   Wt 215 lb 6.4 oz (97.7 kg)   SpO2 98%   BMI 33.74 kg/m   Opioid Risk Score:   Fall Risk Score:  `1  Depression screen [facility] 2/9     03/30/2023   10:51 AM 01/15/2023    5:00 PM 12/19/2022    9:45 AM 11/08/2022    4:20 PM 10/11/2022    3:09 PM 09/22/2022    1:53 PM 07/25/2022    8:50 AM  Depression screen PHQ 2/9  Decreased Interest 0 0 0 0 0 1 1  Down, Depressed, Hopeless 0 0 0 0 0 1 1  PHQ - 2 Score 0 0 0 0 0 2 2  Altered sleeping  3    3 3   Tired, decreased energy  1    1 3   Change in appetite  0    1 3  Feeling bad or  failure about yourself   0    0 1  Trouble concentrating  1    2 3   Moving slowly or fidgety/restless  0    0 1  Suicidal thoughts  0    0 0  PHQ-9 Score  5    9 16   Difficult doing work/chores  Not difficult at all     Somewhat difficult     Review of Systems  Constitutional: Negative.   HENT: Negative.    Eyes: Negative.   Respiratory: Negative.    Cardiovascular: Negative.   Gastrointestinal: Negative.   Endocrine: Negative.   Genitourinary: Negative.   Musculoskeletal:  Positive for arthralgias, back pain and myalgias.  Skin: Negative.   Allergic/Immunologic: Negative.   Neurological: Negative.  Hematological: Negative.   Psychiatric/Behavioral: Negative.    All other systems reviewed and are negative.      Objective:   Physical Exam     Gen: no distress, normal appearing HEENT: oral mucosa pink and moist, NCAT Chest: normal effort, normal rate of breathing Abd: soft, non-distended Ext: no edema Psych: Very pleasant, normal affect Skin: intact Neuro: Alert and oriented, follows commands, cranial nerves II through XII intact, normal speech and language Sensation intact light touch in all 4 extremities No focal motor deficits Musculoskeletal:  Lumbar paraspinal tenderness present Slump test was negative Spurling's test was negative Mild tenderness throughout bilateral shoulders, elbows, wrists, hips, knees, ankles, periscapular muscles to palpation Trigger points noted in bilateral trapezius and periscapular muscles     MRI L spine  Segmentation: The lowest lumbar type non-rib-bearing vertebra is labeled as L5.   Alignment:  Mild levoconvex lumbar scoliosis without subluxation.   Vertebrae:  No significant vertebral marrow edema is identified.   Conus medullaris and cauda equina: Conus extends to the L1 level. Conus and cauda equina appear normal.   Paraspinal and other soft tissues: Unremarkable   Disc levels:   T12-L1: Unremarkable.   L1-2:  Unremarkable.   L2-3: Minimal disc bulge, no impingement.   L3-4: Minimal disc bulge, no impingement.   L4-5: Mild disc bulge, no impingement.   L5-S1: Borderline bilateral foraminal stenosis due to disc bulge and mild facet arthropathy   IMPRESSION: 1. Mild lumbar spondylosis and degenerative disc disease, causing borderline bilateral foraminal stenosis at L5-S1. 2. Mild levoconvex lumbar scoliosis without subluxation.     Performed     MRI C-spine 07/16/2022 1. Stable appearance of the cervical spine. 2. Minimal uncovertebral spurring at C6-7 and C7-T1 without significant stenosis at these levels. 3. Moderate right foraminal stenosis at T2-3 due to facet spurring. 4. Empty sella. This is nonspecific and most often incidental, but can be seen in the setting of idiopathic intracranial hypertension.    Assessment & Plan:  Chronic lower back pain and chronic neck pain -Prior MRI with minimal cervical spondylosis and mild lumbar spondylosis with mild degenerative disc disease resulting in borderline bilateral foraminal stenosis L5-S1, mild lumbar scoliosis -Continue tizanidine  -Continue Norco 5 BID PRN #60, advised to call when she is about 5 days away from needing refill -Patient reports poor results with Tylenol 3-this was discontinued -Provided list of foods that are good for chronic pain prior visit -Opiate risk tool moderate -UDS today -Continue to monitor PDMP -Pain agreement completed prior visit -Pill counts consistent    Fibromyalgia -Continue duloxetine, appears this medicine is helping improve her pain -She reports poor response to gabapentin and Lyrica -Discussed gradual progressive low impact exercise -Counseled regarding sleep hygiene -PT consult- Aquatherapy may be a good option -Low-dose naltrexone could be considered, this would not be beneficial if she was on opioid medications -Will schedule trigger point injections with next visit -Discussed option  to try shockwave therapy -Discussed aquatic therapy-consult  [facility]     Depression -Improved with duloxetine -Denies SI or HI, she reports mood is stable   ADHD -She is on Adderall currently   History of AVM -Continue follow-up with neurosurgery, SP gamma knife radiation surgery plan for repeat brain MRI in 6 months

## 2023-04-02 ENCOUNTER — Encounter: Payer: Self-pay | Admitting: Physician Assistant

## 2023-04-02 ENCOUNTER — Ambulatory Visit: Payer: No Typology Code available for payment source | Attending: Physician Assistant | Admitting: Physician Assistant

## 2023-04-02 VITALS — BP 122/77 | HR 93 | Resp 16 | Ht 66.0 in | Wt 214.6 lb

## 2023-04-02 DIAGNOSIS — Q282 Arteriovenous malformation of cerebral vessels: Secondary | ICD-10-CM

## 2023-04-02 DIAGNOSIS — E559 Vitamin D deficiency, unspecified: Secondary | ICD-10-CM

## 2023-04-02 DIAGNOSIS — F32A Depression, unspecified: Secondary | ICD-10-CM

## 2023-04-02 DIAGNOSIS — E119 Type 2 diabetes mellitus without complications: Secondary | ICD-10-CM

## 2023-04-02 DIAGNOSIS — M5136 Other intervertebral disc degeneration, lumbar region: Secondary | ICD-10-CM

## 2023-04-02 DIAGNOSIS — I7300 Raynaud's syndrome without gangrene: Secondary | ICD-10-CM | POA: Diagnosis not present

## 2023-04-02 DIAGNOSIS — R768 Other specified abnormal immunological findings in serum: Secondary | ICD-10-CM

## 2023-04-02 DIAGNOSIS — M5030 Other cervical disc degeneration, unspecified cervical region: Secondary | ICD-10-CM | POA: Diagnosis not present

## 2023-04-02 DIAGNOSIS — G8929 Other chronic pain: Secondary | ICD-10-CM

## 2023-04-02 DIAGNOSIS — Z87891 Personal history of nicotine dependence: Secondary | ICD-10-CM

## 2023-04-02 DIAGNOSIS — F900 Attention-deficit hyperactivity disorder, predominantly inattentive type: Secondary | ICD-10-CM

## 2023-04-02 DIAGNOSIS — R5383 Other fatigue: Secondary | ICD-10-CM

## 2023-04-02 DIAGNOSIS — R7689 Other specified abnormal immunological findings in serum: Secondary | ICD-10-CM

## 2023-04-02 DIAGNOSIS — R202 Paresthesia of skin: Secondary | ICD-10-CM

## 2023-04-02 DIAGNOSIS — M2550 Pain in unspecified joint: Secondary | ICD-10-CM

## 2023-04-02 DIAGNOSIS — M7918 Myalgia, other site: Secondary | ICD-10-CM | POA: Diagnosis not present

## 2023-04-02 DIAGNOSIS — F419 Anxiety disorder, unspecified: Secondary | ICD-10-CM

## 2023-04-02 DIAGNOSIS — M51369 Other intervertebral disc degeneration, lumbar region without mention of lumbar back pain or lower extremity pain: Secondary | ICD-10-CM

## 2023-04-02 DIAGNOSIS — M7061 Trochanteric bursitis, right hip: Secondary | ICD-10-CM

## 2023-04-02 DIAGNOSIS — M7062 Trochanteric bursitis, left hip: Secondary | ICD-10-CM

## 2023-04-02 DIAGNOSIS — Z8679 Personal history of other diseases of the circulatory system: Secondary | ICD-10-CM

## 2023-04-02 DIAGNOSIS — G932 Benign intracranial hypertension: Secondary | ICD-10-CM

## 2023-04-02 DIAGNOSIS — Z8669 Personal history of other diseases of the nervous system and sense organs: Secondary | ICD-10-CM

## 2023-04-02 DIAGNOSIS — M25562 Pain in left knee: Secondary | ICD-10-CM

## 2023-04-02 DIAGNOSIS — I10 Essential (primary) hypertension: Secondary | ICD-10-CM

## 2023-04-02 DIAGNOSIS — K2100 Gastro-esophageal reflux disease with esophagitis, without bleeding: Secondary | ICD-10-CM

## 2023-04-02 DIAGNOSIS — M25561 Pain in right knee: Secondary | ICD-10-CM | POA: Diagnosis not present

## 2023-04-02 DIAGNOSIS — F4310 Post-traumatic stress disorder, unspecified: Secondary | ICD-10-CM

## 2023-04-02 LAB — CBC WITH DIFFERENTIAL/PLATELET
Absolute Monocytes: 633 cells/uL (ref 200–950)
Basophils Absolute: 89 cells/uL (ref 0–200)
Basophils Relative: 0.8 %
Eosinophils Absolute: 222 cells/uL (ref 15–500)
Eosinophils Relative: 2 %
HCT: 38.3 % (ref 35.0–45.0)
Hemoglobin: 13.1 g/dL (ref 11.7–15.5)
Lymphs Abs: 4040 cells/uL — ABNORMAL HIGH (ref 850–3900)
MCH: 28.1 pg (ref 27.0–33.0)
MCHC: 34.2 g/dL (ref 32.0–36.0)
MCV: 82 fL (ref 80.0–100.0)
MPV: 10.4 fL (ref 7.5–12.5)
Monocytes Relative: 5.7 %
Neutro Abs: 6116 cells/uL (ref 1500–7800)
Neutrophils Relative %: 55.1 %
Platelets: 320 10*3/uL (ref 140–400)
RBC: 4.67 10*6/uL (ref 3.80–5.10)
RDW: 15.3 % — ABNORMAL HIGH (ref 11.0–15.0)
Total Lymphocyte: 36.4 %
WBC: 11.1 10*3/uL — ABNORMAL HIGH (ref 3.8–10.8)

## 2023-04-02 NOTE — Patient Instructions (Signed)
Hip Bursitis Rehab Ask your health care provider which exercises are safe for you. Do exercises exactly as told by your health care provider and adjust them as directed. It is normal to feel mild stretching, pulling, tightness, or discomfort as you do these exercises. Stop right away if you feel sudden pain or your pain gets worse. Do not begin these exercises until told by your health care provider. Stretching exercise This exercise warms up your muscles and joints and improves the movement and flexibility of your hip. This exercise also helps to relieve pain and stiffness. Iliotibial band stretch An iliotibial band is a strong band of muscle tissue that runs from the outer side of your hip to the outer side of your thigh and knee. Lie on your side with your left / right leg in the top position. Bend your left / right knee and grab your ankle. Stretch out your bottom arm to help you balance. Slowly bring your knee back so your thigh is slightly behind your body. Slowly lower your knee toward the floor until you feel a gentle stretch on the outside of your left / right thigh. If you do not feel a stretch and your knee will not lower more toward the floor, place the heel of your other foot on top of your knee and pull your knee down toward the floor with your foot. Hold this position for __________ seconds. Slowly return to the starting position. Repeat __________ times. Complete this exercise __________ times a day. Strengthening exercises These exercises build strength and endurance in your hip and pelvis. Endurance is the ability to use your muscles for a long time, even after they get tired. Bridge This exercise strengthens the muscles that move your thigh backward (hip extensors). Lie on your back on a firm surface with your knees bent and your feet flat on the floor. Tighten your buttocks muscles and lift your buttocks off the floor until your trunk is level with your thighs. Do not arch your  back. You should feel the muscles working in your buttocks and the back of your thighs. If you do not feel these muscles, slide your feet 1-2 inches (2.5-5 cm) farther away from your buttocks. If this exercise is too easy, try doing it with your arms crossed over your chest. Hold this position for __________ seconds. Slowly lower your hips to the starting position. Let your muscles relax completely after each repetition. Repeat __________ times. Complete this exercise __________ times a day. Squats This exercise strengthens the muscles in front of your thigh and knee (quadriceps). Stand in front of a table, with your feet and knees pointing straight ahead. You may rest your hands on the table for balance but not for support. Slowly bend your knees and lower your hips like you are going to sit in a chair. Keep your weight over your heels, not over your toes. Keep your lower legs upright so they are parallel with the table legs. Do not let your hips go lower than your knees. Do not bend lower than told by your health care provider. If your hip pain increases, do not bend as low. Hold the squat position for __________ seconds. Slowly push with your legs to return to standing. Do not use your hands to pull yourself to standing. Repeat __________ times. Complete this exercise __________ times a day. Hip hike  Stand sideways on a bottom step. Stand on your left / right leg with your other foot unsupported next to   the step. You can hold on to the railing or wall for balance if needed. Keep your knees straight and your torso square. Then lift your left / right hip up toward the ceiling. Hold this position for __________ seconds. Slowly let your left / right hip lower toward the floor, past the starting position. Your foot should get closer to the floor. Do not lean or bend your knees. Repeat __________ times. Complete this exercise __________ times a day. Single leg stand This exercise increases  your balance. Without shoes, stand near a railing or in a doorway. You may hold on to the railing or door frame as needed for balance. Squeeze your left / right buttock muscles, then lift up your other foot. Do not let your left / right hip push out to the side. It is helpful to stand in front of a mirror for this exercise so you can watch your hip. Hold this position for __________ seconds. Repeat __________ times. Complete this exercise __________ times a day. This information is not intended to replace advice given to you by your health care provider. Make sure you discuss any questions you have with your health care provider. Document Revised: 08/03/2021 Document Reviewed: 08/03/2021 Elsevier Patient Education  2024 Else[employer] Inc.  

## 2023-04-03 NOTE — Progress Notes (Signed)
Ro and La antibodies negative.  RF negative.  CK WNL. ESR WNL  dsDNA negative. Complements WNL.  Vitamin D WNL.  RNP and smith antibodies negative.    CMP WNL WBC count is slightly elevated. Absolute lymphocytes are slightly elevated-please clarify if she has had any recent infections? Any recent steroid use?   Urine protein is borderline elevated. Recommend rechecking in 3-4 weeks.

## 2023-04-04 ENCOUNTER — Telehealth: Payer: Self-pay | Admitting: *Deleted

## 2023-04-04 DIAGNOSIS — M7918 Myalgia, other site: Secondary | ICD-10-CM

## 2023-04-04 DIAGNOSIS — I7300 Raynaud's syndrome without gangrene: Secondary | ICD-10-CM

## 2023-04-04 NOTE — Telephone Encounter (Signed)
-----   Message from Gearldine Bienenstock sent at 04/03/2023  9:09 PM EDT ----- Ro and La antibodies negative.  RF negative.  CK WNL. ESR WNL  dsDNA negative. Complements WNL.  Vitamin D WNL.  RNP and smith antibodies negative.    CMP WNL WBC count is slightly elevated. Absolute lymphocytes are slightly elevated-please clarify if she has had any recent infections? Any recent steroid use?   Urine protein is borderline elevated. Recommend rechecking in 3-4 weeks.

## 2023-04-05 NOTE — Progress Notes (Signed)
ANA remains positive but is a low titer.   Anti-CCP negative.

## 2023-04-06 ENCOUNTER — Telehealth: Payer: Self-pay | Admitting: *Deleted

## 2023-04-06 ENCOUNTER — Other Ambulatory Visit: Payer: Self-pay | Admitting: *Deleted

## 2023-04-06 DIAGNOSIS — I7300 Raynaud's syndrome without gangrene: Secondary | ICD-10-CM

## 2023-04-06 DIAGNOSIS — R768 Other specified abnormal immunological findings in serum: Secondary | ICD-10-CM

## 2023-04-06 DIAGNOSIS — M7918 Myalgia, other site: Secondary | ICD-10-CM

## 2023-04-06 NOTE — Telephone Encounter (Signed)
ANA remains positive-titer is unchanged.  This is a very low positive titer.    Liver enzymes were normal with most recent lab results.   Ok to add mitochondrial antibodies and smooth muscle antibody if she would like to have these drawn.

## 2023-04-06 NOTE — Telephone Encounter (Signed)
Error

## 2023-04-11 ENCOUNTER — Telehealth: Payer: Self-pay | Admitting: *Deleted

## 2023-04-11 NOTE — Telephone Encounter (Signed)
-----   Message from Armanda Magic sent at 03/23/2023 12:57 PM EDT ----- Please let patient know that sleep study showed no significant sleep apnea.

## 2023-04-11 NOTE — Telephone Encounter (Signed)
The patient has been notified of the result and verbalized understanding.  All questions (if any) were answered. Latrelle Dodrill, CMA 04/11/2023 10:53 AM    Pt is agreeable to normal results.

## 2023-04-16 ENCOUNTER — Encounter: Payer: Self-pay | Admitting: Physical Medicine & Rehabilitation

## 2023-04-16 NOTE — Progress Notes (Signed)
Mitochondrial M2 antibody negative.

## 2023-04-17 ENCOUNTER — Telehealth: Payer: Self-pay | Admitting: *Deleted

## 2023-04-17 MED ORDER — HYDROCODONE-ACETAMINOPHEN 5-325 MG PO TABS: 1.0000 | ORAL_TABLET | Freq: Two times a day (BID) | ORAL | 0 refills | Status: DC | PRN

## 2023-04-17 MED ORDER — HYDROCODONE-ACETAMINOPHEN 5-300 MG PO TABS: 1.0000 | ORAL_TABLET | Freq: Two times a day (BID) | ORAL | 0 refills | Status: DC | PRN

## 2023-04-17 NOTE — Telephone Encounter (Signed)
Hydrocodone sent yesterday is on backorder. Pharmacy faxed suggesting alternative Hydrocodone/Acet [phone]

## 2023-04-18 ENCOUNTER — Telehealth: Payer: No Typology Code available for payment source | Admitting: Nurse Practitioner

## 2023-04-18 ENCOUNTER — Encounter: Payer: Self-pay | Admitting: Nurse Practitioner

## 2023-04-18 VITALS — BP 130/81

## 2023-04-18 DIAGNOSIS — Z23 Encounter for immunization: Secondary | ICD-10-CM

## 2023-04-18 DIAGNOSIS — I5040 Unspecified combined systolic (congestive) and diastolic (congestive) heart failure: Secondary | ICD-10-CM

## 2023-04-18 DIAGNOSIS — E1169 Type 2 diabetes mellitus with other specified complication: Secondary | ICD-10-CM

## 2023-04-18 DIAGNOSIS — I110 Hypertensive heart disease with heart failure: Secondary | ICD-10-CM | POA: Diagnosis not present

## 2023-04-18 DIAGNOSIS — E669 Obesity, unspecified: Secondary | ICD-10-CM

## 2023-04-18 MED ORDER — HPV 9-VALENT RECOMB VACCINE IM SUSY: 0.5000 mL | PREFILLED_SYRINGE | Freq: Once | INTRAMUSCULAR | 0 refills | Status: AC

## 2023-04-18 NOTE — Progress Notes (Signed)
Actin antibody negative

## 2023-04-18 NOTE — Assessment & Plan Note (Signed)
She is not taking lantus due to causing her to itch. Will check HgbA1c. Continue Fiasp sliding scale. Will come in the next week to check HgbA1c

## 2023-04-18 NOTE — Assessment & Plan Note (Signed)
She is encouraged to strive for BMI less than 30 to decrease cardiac risk. Goal to get at least 150 minutes of exercise per week.

## 2023-04-18 NOTE — Progress Notes (Addendum)
Virtual Visit via MyChart   This visit type was conducted due to national recommendations for restrictions regarding the COVID-19 Pandemic (e.g. social distancing) in an effort to limit this patient's exposure and mitigate transmission in our community.  Due to her co-morbid illnesses, this patient is at least at moderate risk for complications without adequate follow up.  This format is felt to be most appropriate for this patient at this time.  All issues noted in this document were discussed and addressed.  A limited physical exam was performed with this format.    This visit type was conducted due to national recommendations for restrictions regarding the COVID-19 Pandemic (e.g. social distancing) in an effort to limit this patient's exposure and mitigate transmission in our community.  Patients identity confirmed using two different identifiers.  This format is felt to be most appropriate for this patient at this time.  All issues noted in this document were discussed and addressed.  No physical exam was performed (except for noted visual exam findings with Video Visits).    Date:  04/18/2023   ID:  Virginia Douglas, DOB October 19, 1975, MRN [id number]  Patient Location: Home   Provider location:   Office    Chief Complaint:  BP f/u  History of Present Illness:    Virginia Douglas is a 47 y.o. female who presents via video conferencing for a telehealth visit today.    The patient does not have symptoms concerning for COVID-19 infection (fever, chills, cough, or new shortness of breath).   Patient presents today for a BP and DM follow up, patient repots compliance with medications. Patient denies any chest pain, SOB, or headaches. Patient has no other concerns today. She has seen cardiologist, rheumatology and pain management no changes.   BP Readings from Last 3 Encounters: 04/18/23 : 130/81 04/02/23 : 122/77 03/30/23 : 103/70    Diabetes She presents for her  follow-up diabetic visit. She has type 2 diabetes mellitus. There are no hypoglycemic associated symptoms. There are no diabetic associated symptoms. There are no hypoglycemic complications. Diabetic complications include heart disease. Risk factors for coronary artery disease include obesity and sedentary lifestyle. Current diabetic treatment includes oral agent (dual therapy) and oral agent (monotherapy). She is compliant with treatment most of the time. She has not had a previous visit with a dietitian. She rarely participates in exercise. (75-115)  Hypertension This is a chronic problem. The current episode started more than 1 year ago. The problem is unchanged. The problem is controlled. There are no associated agents to hypertension. Risk factors for coronary artery disease include obesity and sedentary lifestyle. Past treatments include diuretics and ACE inhibitors. The current treatment provides significant improvement. Compliance problems include exercise (unable due to back pain).  There is no history of chronic renal disease.  Insomnia Primary symptoms: fragmented sleep, frequent awakening.   The current episode started more than one month. The onset quality is sudden. The problem occurs nightly. The problem has been gradually worsening since onset. The symptoms are aggravated by family issues (tragic death of her mother). PMH includes: no hypertension.      Past Medical History:  Diagnosis Date   Acute pain of left shoulder 06/05/2019   ADHD (attention deficit hyperactivity disorder)    Anemia    iron deficiency   as a teenager   Anxiety    Arthritis    Chronic combined systolic and diastolic congestive heart failure (HCC) 11/23/2022   This is a new diagnosis with  Cardiology, she is scheduled on 2/14 for her MRI of her heart.     Diabetes (HCC)    Edema    GERD (gastroesophageal reflux disease)    History of congestive heart failure    dx by cardiology   Hypertension    patient  denies- patient stated that the PCP said it was for heart prevention.   Left leg pain 04/07/2019   LLQ pain 12/08/2015   Migraine headache    Past Surgical History:  Procedure Laterality Date   ABDOMINAL HYSTERECTOMY  2012   CHOLECYSTECTOMY     COLONOSCOPY  12/28/2020   polyps   ESOPHAGEAL DILATION     gamma knife     04/2022   IR ANGIO INTRA EXTRACRAN SEL COM CAROTID INNOMINATE BILAT MOD SED  05/31/2021   IR ANGIO INTRA EXTRACRAN SEL COM CAROTID INNOMINATE BILAT MOD SED  06/27/2021   IR ANGIO INTRA EXTRACRAN SEL COM CAROTID INNOMINATE UNI R MOD SED  11/21/2021   IR ANGIO VERTEBRAL SEL VERTEBRAL UNI R MOD SED  05/31/2021   IR CT HEAD LTD  06/27/2021   IR CT HEAD LTD  11/21/2021   IR INTRA CRAN STENT  06/27/2021   IR INTRA CRAN STENT  11/21/2021   IR RADIOLOGIST EVAL & MGMT  06/03/2021   IR RADIOLOGIST EVAL & MGMT  07/13/2021   IR RADIOLOGIST EVAL & MGMT  08/27/2021   IR RADIOLOGIST EVAL & MGMT  10/30/2021   IR RADIOLOGIST EVAL & MGMT  12/06/2021   IR US GUIDE VASC ACCESS RIGHT  05/31/2021   IR US GUIDE VASC ACCESS RIGHT  06/27/2021   IR US GUIDE VASC ACCESS RIGHT  11/21/2021   OVARIAN CYST REMOVAL     RADIOLOGY WITH ANESTHESIA N/A 06/27/2021   Procedure: STENTING;  Surgeon: Julieanne Cotton, MD;  Location: [facility] OR;  Service: Radiology;  Laterality: N/A;   RADIOLOGY WITH ANESTHESIA N/A 11/21/2021   Procedure: IR WITH ANESTHESIA STENTING;  Surgeon: Julieanne Cotton, MD;  Location: [facility] OR;  Service: Radiology;  Laterality: N/A;     Current Meds  Medication Sig   acetaZOLAMIDE (DIAMOX) 250 MG tablet Take 1 tablet (250 mg total) by mouth 2 (two) times daily.   ADDERALL XR 30 MG 24 hr capsule Take 30 mg by mouth daily.   albuterol (VENTOLIN HFA) 108 (90 Base) MCG/ACT inhaler Inhale 2 puffs into the lungs as needed.   aspirin EC 81 MG tablet Take 81 mg by mouth daily. Swallow whole.   atorvastatin (LIPITOR) 10 MG tablet TAKE 1 TABLET BY MOUTH EVERY DAY   Blood Pressure Monitoring  Soln KIT    buPROPion (WELLBUTRIN XL) 150 MG 24 hr tablet TAKE 1 TABLET BY MOUTH EVERY DAY IN THE MORNING   carvedilol (COREG) 3.125 MG tablet Take 2 tablets (6.25 mg total) by mouth 2 (two) times daily.   cetirizine (ZYRTEC) 10 MG tablet Take 10 mg by mouth daily as needed for allergies.   clonazePAM (KLONOPIN) 0.5 MG tablet Take 1 tablet (0.5 mg total) by mouth daily as needed.   clopidogrel (PLAVIX) 75 MG tablet Take 1 tablet (75 mg total) by mouth daily.   DULoxetine (CYMBALTA) 60 MG capsule TAKE 1 CAPSULE BY MOUTH EVERY DAY   empagliflozin (JARDIANCE) 10 MG TABS tablet Take 1 tablet by mouth once daily in the morning   eszopiclone (LUNESTA) 2 MG TABS tablet Take 1 tablet (2 mg total) by mouth at bedtime. Take immediately before bedtime   famotidine (PEPCID) 20 MG tablet  Take 20 mg by mouth daily.   Galcanezumab-gnlm (EMGALITY) 120 MG/ML SOAJ Inject 120 mg into the skin every 30 (thirty) days.   guanFACINE (INTUNIV) 1 MG TB24 ER tablet Take 1 mg by mouth daily.   hpv 9-valent vaccine (GARDASIL 9) prefilled syringe Inject 0.5 mLs into the muscle once for 1 dose.   HYDROcodone-Acetaminophen 5-300 MG TABS Take 1 tablet by mouth every 12 (twelve) hours as needed.   insulin aspart (FIASP FLEXTOUCH) 100 UNIT/ML FlexTouch Pen INJECT [place] INSULIN BEFORE MEALS AND AT HS IF BS < 150 - 0 UNITS, 150-199 - 3 UNITS, 200-249 - 6 UNITS, 250-299 - 9 UNITS, 300-349 - 12 UNITS, >350 - 15 UNITS. IF >400 CALL OUR OFFICE WITH YOUR BLOOD SUGAR READINGS.   Insulin Pen Needle (PEN NEEDLES) 32G X 4 MM MISC 1 each by Does not apply route once a week.   Lancets (ONETOUCH DELICA PLUS LANCET33G) MISC Apply 1 each topically 3 (three) times daily.   LINZESS 72 MCG capsule TAKE 1 CAPSULE BY MOUTH DAILY BEFORE BREAKFAST.   phentermine (ADIPEX-P) 37.5 MG tablet Take 1 tablet (37.5 mg total) by mouth daily before breakfast.   Polyethyl Glycol-Propyl Glycol (SYSTANE) 0.4-0.3 % SOLN Place 1 drop into both eyes daily as needed (dry  eyes).   sacubitril-valsartan (ENTRESTO) 24-26 MG Take 1 tablet by mouth 2 (two) times daily.   spironolactone (ALDACTONE) 25 MG tablet Take 0.5 tablets (12.5 mg total) by mouth daily.   tirzepatide (MOUNJARO) 7.5 MG/0.5ML Pen Inject 7.5 mg into the skin once a week.   tiZANidine (ZANAFLEX) 4 MG tablet TAKE 1 TABLET (4 MG TOTAL) BY MOUTH EVERY 8 (EIGHT) HOURS AS NEEDED FOR MUSCLE SPASMS   topiramate (TOPAMAX) 50 MG tablet TAKE 2 AND 1/2 TABLETS (125 MG TOTAL) BY MOUTH AT BEDTIME. Strength: 50 mg   Vitamin D, Ergocalciferol, (DRISDOL) 1.25 MG (50000 UNIT) CAPS capsule TAKE 1 CAPSULE BY MOUTH EVERY 7 DAYS **NOT COVERED**   [DISCONTINUED] insulin glargine (LANTUS) 100 UNIT/ML Solostar Pen Inject 23 Units into the skin daily.     Allergies:   Patient has no known allergies.   Social History   Tobacco Use   Smoking status: Former    Current packs/day: 0.50    Average packs/day: 0.5 packs/day for 25.1 years (12.5 ttl pk-yrs)    Types: Cigarettes    Start date: 03/24/2019    Passive exposure: Current   Smokeless tobacco: Never   Tobacco comments:    06/24/21- quit over a year ago  Vaping Use   Vaping status: Never Used  Substance Use Topics   Alcohol use: No    Alcohol/week: 0.0 standard drinks of alcohol   Drug use: No     Family Hx: The patient's family history includes Breast cancer in her maternal aunt and paternal grandmother; Cancer in her maternal aunt and maternal grandmother; Diabetes in her father, maternal grandmother, mother, and paternal grandmother; Healthy in her son and son; Hypertension in her brother, father, and mother; Kidney failure in her father; Pancreatic cancer in her maternal grandmother; Stroke in her paternal grandfather.  ROS:   Please see the history of present illness.    Review of Systems  Constitutional: Negative.   HENT: Negative.    Respiratory: Negative.    Cardiovascular: Negative.   Neurological: Negative.   Psychiatric/Behavioral: Negative.       All other systems reviewed and are negative.   Labs/Other Tests and Data Reviewed:    Recent Labs: 01/15/2023: TSH 0.543  04/02/2023: ALT 13; BUN 8; Creat 0.64; Hemoglobin 13.1; Platelets 320; Potassium 4.1; Sodium 142   Recent Lipid Panel Lab Results  Component Value Date/Time   CHOL 135 01/15/2023 05:05 PM   TRIG 90 01/15/2023 05:05 PM   HDL 44 01/15/2023 05:05 PM   CHOLHDL 3.1 01/15/2023 05:05 PM   LDLCALC 74 01/15/2023 05:05 PM    Wt Readings from Last 3 Encounters:  04/02/23 214 lb 9.6 oz (97.3 kg)  03/30/23 215 lb 6.4 oz (97.7 kg)  02/28/23 224 lb (101.6 kg)      Exam:    Vital Signs:  BP 130/81     Physical Exam Constitutional:      General: She is not in acute distress.    Appearance: Normal appearance.  Pulmonary:     Effort: Pulmonary effort is normal. No respiratory distress.  Neurological:     General: No focal deficit present.     Mental Status: She is alert and oriented to person, place, and time. Mental status is at baseline.     Cranial Nerves: No cranial nerve deficit.  Psychiatric:        Mood and Affect: Mood and affect normal.        Behavior: Behavior normal.        Thought Content: Thought content normal.        Cognition and Memory: Memory normal.        Judgment: Judgment normal.     ASSESSMENT & PLAN:    Benign hypertensive heart disease with congestive heart failure and with combined systolic and diastolic dysfunction (HCC) Assessment & Plan: Blood pressure is fairly controlled, continue current medications and f/u with cardiology   Type 2 diabetes mellitus with obesity (HCC) Assessment & Plan: She is not taking lantus due to causing her to itch. Will check HgbA1c. Continue Fiasp sliding scale. Will come in the next week to check HgbA1c  Orders: -     Hemoglobin A1c; Future  Obesity (BMI 30-39.9) Assessment & Plan: She is encouraged to strive for BMI less than 30 to decrease cardiac risk. Goal to get at least 150 minutes of  exercise per week.    Encounter for immunization -     HPV 9-Valent Recomb Vaccine; Inject 0.5 mLs into the muscle once for 1 dose.  Dispense: 0.5 mL; Refill: 0     COVID-19 Education: The signs and symptoms of COVID-19 were discussed with the patient and how to seek care for testing (follow up with PCP or arrange E-visit).  The importance of social distancing was discussed today.  Patient Risk:   After full review of this patients clinical status, I feel that they are at least moderate risk at this time.  Time:   Today, I have spent 8.30 minutes/ seconds with the patient with telehealth technology discussing above diagnoses.     Medication Adjustments/Labs and Tests Ordered: Current medicines are reviewed at length with the patient today.  Concerns regarding medicines are outlined above.   Tests Ordered: Orders Placed This Encounter  Procedures   Hemoglobin A1c    Medication Changes: Meds ordered this encounter  Medications   hpv 9-valent vaccine (GARDASIL 9) prefilled syringe    Sig: Inject 0.5 mLs into the muscle once for 1 dose.    Dispense:  0.5 mL    Refill:  0    Disposition:  Follow up in 4 month(s)  Signed, Arnette Felts, FNP

## 2023-04-18 NOTE — Assessment & Plan Note (Signed)
Blood pressure is fairly controlled, continue current medications and f/u with cardiology

## 2023-04-23 ENCOUNTER — Telehealth: Payer: Self-pay | Admitting: *Deleted

## 2023-04-23 ENCOUNTER — Encounter: Payer: Self-pay | Admitting: Cardiology

## 2023-04-23 NOTE — Telephone Encounter (Signed)
Morrie Sheldon calling to make office aware they have tried several times to contact patient.  I called patient and let her also gave contact information. Patient says she will call them. Nothing further needed.

## 2023-04-24 ENCOUNTER — Telehealth: Payer: Self-pay

## 2023-04-24 NOTE — Telephone Encounter (Signed)
Cancelled Sleep Test request because Test Patient Completed [facility] Test on 03/22/23, Results called back on 04/11/23.

## 2023-04-29 ENCOUNTER — Other Ambulatory Visit: Payer: Self-pay | Admitting: Nurse Practitioner

## 2023-05-11 ENCOUNTER — Other Ambulatory Visit: Payer: Self-pay | Admitting: Neurology

## 2023-05-14 ENCOUNTER — Other Ambulatory Visit: Payer: Self-pay

## 2023-05-14 DIAGNOSIS — R0683 Snoring: Secondary | ICD-10-CM

## 2023-05-14 DIAGNOSIS — G4719 Other hypersomnia: Secondary | ICD-10-CM

## 2023-05-16 NOTE — Progress Notes (Unsigned)
NEUROLOGY FOLLOW UP OFFICE NOTE  Virginia Douglas [id number]  Assessment/Plan:   Migraine without aura, without status migrainosus, not intractable Bilateral sigmoid sinus stenosis s/p stent bilaterally Left PCA AVM s/p stereotactic surgery - followed at [facility]. History of idiopathic intracranial hypertension    Migraine prevention:  Emgality every 30 days, topiramate125mg  daily Migraine rescue:  will have her try Ubrelvy samples.  If ineffective, would resubmit Nurtec for approval.  She has already failed several triptans and Nurtec is effective for acute therapy. IIH therapy:  acetazolamide 250mg  BID. Patient requests to remain on acetazolamide despite already taking topiramate and eye exams without papilledema.  Serum bicarb levels typically borderline low (19-20).  Continue to monitor. Limit use of pain relievers to no more than 2 days out of week to prevent risk of rebound or medication-overuse headache. Keep headache diary Follow up with me 6 months     Subjective:  Virginia Douglas is a 47 year old right-handed woman with diabetes,  and hypertension who follows up for migraines.   UPDATE: Insurance will not cover Nurtec.    Intensity:  severe  Duration:  2 days  Frequency:  once every 2 weeks. She had an intractable migraine lasting over a week just prior to the [place] shot.  Goes to [facility] about every 6 months to follow up on the AVM.  MRI of brain with and without contrast at [facility] on 11/02/2022 revealed Status post treatment of the left cerebellar arteriovenous malformation, with  slight decrease in the size of draining vein. The adjacent small enhancing area also appears decreased, likely representing treated nidus. There is mild gliosis/edema surrounding the treated AV malformation.   Current NSAIDS:  none Current analgesics:  hydrocodone (back pain) Other current abortive:  none Current triptans: none Current ergotamine: None Current anti-emetic: Zofran 4  mg Current muscle relaxants: Robaxin Current anti-anxiolytic:  alprazolam Current sleep aide:  none Current Antihypertensive medications:  Lisinopril-HCTZ Current Antidepressant medications:  Cymbalta 60mg  daily Current Anticonvulsant medications: Topiramate 125mg  at bedtime, acetazolamide 250mg  twice daily, Lyrica 100mg  three times daily. Current anti-CGRP:  Emgality Current Vitamins/Herbal/Supplements:  turmeric Current Antihistamines/Decongestants:  none Other therapy:  Reyvow (rescue) Hormone/birth control:  no  Other medications:  Plavix, atorvastatin  CMP from July normal.   Caffeine: One soda a week Diet: Hydrates Exercise: Yes Depression: Yes; Anxiety: Yes Other pain: Diffuse joint pain, shoulder and back pain.  Has Raynaud's.  ANA positive. Seen by rheumatology.  No specific diagnosis established. Sleep hygiene: Varies   HISTORY: Onset: Remote history of migraines.  Controlled for many years.  Returned in May 2019.Marland Kitchen Location:  Right sided head/ear radiating down right side of neck.  Does not radiate down the right arm. Quality:  Pounding in head, burning in neck Initial intensity:  Severe.  She denies  thunderclap headache or severe headache that wakes her from sleep. Aura:  no Prodrome:  no Postdrome:  no Associated symptoms:  Nausea, vomiting, photophobia, phonophobia, blurred vision.  She denies associated unilateral numbness or weakness. Initial duration:  1 day Initial Frequency:  2 days a week Initial Frequency of abortive medication: 2 days a week Triggers:  Emotional stress Relieving factors:  Heating pad, Robaxin Activity:  Aggravates   She was evaluated in the ED on 02/10/18, where CTA of head and neck was performed and personally reviewed.  It demonstrated empty sella but otherwise unremarkable for mass lesion, aneurysm or dissection.  She was diagnosed with idiopathic intracranial hypertension diagnosed in 2019.  She is on Diamox  250mg  daily.  She is followed  by ophthalmology.  Exam note from 10/10/18 stated "maybe slight nasal nerve elevation" but no definite signs of papilledema.   She has longstanding history of numbness and tingling in the feet.  NCV-EMG of lower extremities in 2017 was normal.  She reports left sided low back pain radiating down the side of the left leg with associated numbness in the side of leg and foot since early 2020  She had a lumbar X-ray which demonstrated mild dextroscoliosis apex L4.  No injury.  She also has endorsed right sided leg pain as well.  MRI of lumbar spine was performed on 05/09/2020 which was personally reviewed and showed mild lumbar spondylosis and degenerative disc disease with very mild bilateral foraminal stenosis at L5-S1.  She followed up with her PCP regarding right knee pain and swelling.  X-ray on 09/29/2020 was negative.  She reports generalized pain and numbness and tingling.  If she holds something, she may drop it.  Labs from 2021 include B12 388,  TSH  0.872, ANA negative, negative dsDNA, sed rate 13, CRP 10, RF negative.  Hgb A1c has increased over 2021 from 5.8 to 6.4.  Gabapentin previously used was ineffective for neuropathic pain.  She had a repeat NCV-EMG of the right upper and lower extremities on 01/04/2021, which was normal.    In June 2022, she woke up one morning and noted the right sided of her face was numb and mouth twisted.  She had difficulty drinking fluids and would drip out the side of her mouth.  She is not sure if she had trouble closing her eye or raising her eyebrow.  She had a slight headache but no facial pain, slurred speech, language difficulty or involvement of arm or leg.  It lasted about a week or a little bit longer. She had an MRI of the brain with and without contrast on 05/19/2021 which showed a small 8 mm enhancing vascular nodule within the left superior cerebellum.  She was referred to endovascular radiology.  Cerebral angiogram revealed a 3.4 mm x 4.3 mm AVM involving the left  posterior cerebral artery P3 segment and high-grade left sigmoid sinus proximally and moderate to severe stenosis of right sigmoid sinus proximally.  She did endorse pulsatile tinnitus in the left ear.  She underwent endovascular revascularization of the high-grade stenosis of the left sigmoid sinus on 06/27/2021.  Headache and pulsatile tinnitus resolved.  MRV of head on 09/26/2021 personally reviewed showed stable and patent stent placement of the distal left transverse sinus and proximal sigmoid sinus stent placement.  She also noted pulsatile tinnitus in her right ear, so she underwent endovascular revascularization of the right transverse sinus sigmoid sinus junction stenosis on 10/28/2021.  Underwent stereotactic surgery of the cerebral AVM at [facility] on 05/01/2022.     Past NSAIDS:  Ibuprofen, naproxen Past analgesics:  tramadol 50mg  Past abortive triptans: Sumatriptan 100 mg, rizatriptan 10mg , eletriptan 40mg  Past muscle relaxants:  Flexeril Past anti-emetic:  no Past antihypertensive medications:  no Past antidepressant medications:  nortriptyline 50mg  (caused nausea/vomiting, problems sleeping), duloxetine Past anticonvulsant medications:  gabapentin 300mg  twice daily (for neuropathic pain) Past CGRP inhibitor:  Ajovy, Aimovig 140mg  (effective), Nurtec (effective/not covered) Past vitamins/Herbal/Supplements:  no Past antihistamines/decongestants:  no Other past therapies:  no   Family history of headache:  no   MRI of cervical spine from 09/22/15 was personally reviewed and was unremarkable.  PAST MEDICAL HISTORY: Past Medical History:  Diagnosis Date  Acute pain of left shoulder 06/05/2019   ADHD (attention deficit hyperactivity disorder)    Anemia    iron deficiency   as a teenager   Anxiety    Arthritis    Chronic combined systolic and diastolic congestive heart failure (HCC) 11/23/2022   This is a new diagnosis with Cardiology, she is scheduled on 2/14 for her MRI of her heart.      Diabetes (HCC)    Edema    GERD (gastroesophageal reflux disease)    History of congestive heart failure    dx by cardiology   Hypertension    patient denies- patient stated that the PCP said it was for heart prevention.   Left leg pain 04/07/2019   LLQ pain 12/08/2015   Migraine headache     MEDICATIONS: Current Outpatient Medications on File Prior to Visit  Medication Sig Dispense Refill   acetaZOLAMIDE (DIAMOX) 250 MG tablet Take 1 tablet (250 mg total) by mouth 2 (two) times daily. 180 tablet 1   ADDERALL XR 30 MG 24 hr capsule Take 30 mg by mouth daily.     albuterol (VENTOLIN HFA) 108 (90 Base) MCG/ACT inhaler Inhale 2 puffs into the lungs as needed.     aspirin EC 81 MG tablet Take 81 mg by mouth daily. Swallow whole.     atorvastatin (LIPITOR) 10 MG tablet TAKE 1 TABLET BY MOUTH EVERY DAY 90 tablet 1   Blood Pressure Monitoring Soln KIT      buPROPion (WELLBUTRIN XL) 150 MG 24 hr tablet TAKE 1 TABLET BY MOUTH EVERY DAY IN THE MORNING 90 tablet 1   carvedilol (COREG) 3.125 MG tablet Take 2 tablets (6.25 mg total) by mouth 2 (two) times daily. 180 tablet 3   cetirizine (ZYRTEC) 10 MG tablet Take 10 mg by mouth daily as needed for allergies.     clonazePAM (KLONOPIN) 0.5 MG tablet Take 1 tablet (0.5 mg total) by mouth daily as needed. 30 tablet 2   clopidogrel (PLAVIX) 75 MG tablet Take 1 tablet (75 mg total) by mouth daily. 90 tablet 3   DULoxetine (CYMBALTA) 60 MG capsule TAKE 1 CAPSULE BY MOUTH EVERY DAY 90 capsule 1   empagliflozin (JARDIANCE) 10 MG TABS tablet Take 1 tablet by mouth once daily in the morning 30 tablet 5   eszopiclone (LUNESTA) 2 MG TABS tablet Take 1 tablet (2 mg total) by mouth at bedtime. Take immediately before bedtime 30 tablet 5   famotidine (PEPCID) 20 MG tablet Take 20 mg by mouth daily.     Galcanezumab-gnlm (EMGALITY) 120 MG/ML SOAJ Inject 120 mg into the skin every 30 (thirty) days. 1.12 mL 5   guanFACINE (INTUNIV) 1 MG TB24 ER tablet Take 1 mg  by mouth daily.     HYDROcodone-Acetaminophen 5-300 MG TABS Take 1 tablet by mouth every 12 (twelve) hours as needed. 60 tablet 0   insulin aspart (FIASP FLEXTOUCH) 100 UNIT/ML FlexTouch Pen INJECT [place] INSULIN BEFORE MEALS AND AT HS IF BS < 150 - 0 UNITS, 150-199 - 3 UNITS, 200-249 - 6 UNITS, 250-299 - 9 UNITS, 300-349 - 12 UNITS, >350 - 15 UNITS. IF >400 CALL OUR OFFICE WITH YOUR BLOOD SUGAR READINGS. 15 mL 1   Insulin Pen Needle (PEN NEEDLES) 32G X 4 MM MISC 1 each by Does not apply route once a week. 30 each 3   Lancets (ONETOUCH DELICA PLUS LANCET33G) MISC Apply 1 each topically 3 (three) times daily.     LINZESS 72 MCG  capsule TAKE 1 CAPSULE BY MOUTH DAILY BEFORE BREAKFAST. 30 capsule 5   phentermine (ADIPEX-P) 37.5 MG tablet Take 1 tablet (37.5 mg total) by mouth daily before breakfast. 30 tablet 1   Polyethyl Glycol-Propyl Glycol (SYSTANE) 0.4-0.3 % SOLN Place 1 drop into both eyes daily as needed (dry eyes).     Rimegepant Sulfate (NURTEC) 75 MG TBDP Take 1 tablet (75 mg total) by mouth daily as needed. (Patient not taking: Reported on 04/02/2023) 8 tablet 11   sacubitril-valsartan (ENTRESTO) 24-26 MG Take 1 tablet by mouth 2 (two) times daily. 180 tablet 3   spironolactone (ALDACTONE) 25 MG tablet Take 0.5 tablets (12.5 mg total) by mouth daily. 45 tablet 3   tirzepatide (MOUNJARO) 7.5 MG/0.5ML Pen Inject 7.5 mg into the skin once a week. 2 mL 5   tiZANidine (ZANAFLEX) 4 MG tablet TAKE 1 TABLET (4 MG TOTAL) BY MOUTH EVERY 8 (EIGHT) HOURS AS NEEDED FOR MUSCLE SPASMS 270 tablet 1   topiramate (TOPAMAX) 50 MG tablet TAKE 2 AND 1/2 TABLETS (125 MG TOTAL) BY MOUTH AT BEDTIME. Strength: 50 mg 225 tablet 1   Vitamin D, Ergocalciferol, (DRISDOL) 1.25 MG (50000 UNIT) CAPS capsule TAKE 1 CAPSULE BY MOUTH EVERY 7 DAYS **NOT COVERED** 12 capsule 0   [DISCONTINUED] metFORMIN (GLUCOPHAGE-XR) 500 MG 24 hr tablet Take 2 tablets (1,000 mg total) by mouth 2 (two) times daily. (Patient not taking: Reported on  06/05/2019) 30 tablet 0   [DISCONTINUED] Phentermine-Topiramate 11.25-69 MG CP24 Take 1 tablet by mouth daily. 30 capsule 1   No current facility-administered medications on file prior to visit.    ALLERGIES: No Known Allergies  FAMILY HISTORY: Family History  Problem Relation Age of Onset   Diabetes Mother    Hypertension Mother    Diabetes Father    Hypertension Father    Kidney failure Father    Hypertension Brother    Cancer Maternal Grandmother    Diabetes Maternal Grandmother    Pancreatic cancer Maternal Grandmother    Diabetes Paternal Grandmother    Breast cancer Paternal Grandmother    Stroke Paternal Grandfather    Healthy Son    Healthy Son    Cancer Maternal Aunt        breast   Breast cancer Maternal Aunt       Objective:  Blood pressure 133/70, pulse 95, height 5\' 9"  (1.753 m), weight 213 lb 3.2 oz (96.7 kg), SpO2 99%. General: No acute distress.  Patient appears well-groomed.   Head:  Normocephalic/atraumatic Neck:  Supple.  No paraspinal tenderness.  Full range of motion. Heart:  Regular rate and rhythm. Neuro:  Alert and oriented.  Speech fluent and not dysarthric.  Language intact.  CN II-XII intact.  Bulk and tone normal.  Muscle strength 5/5 throughout.  Deep tendon reflexes 2+ throughout.  Gait normal.  Romberg negative.    Shon Millet, DO  CC: Arnette Felts, FNP

## 2023-05-17 ENCOUNTER — Other Ambulatory Visit: Payer: No Typology Code available for payment source

## 2023-05-17 ENCOUNTER — Ambulatory Visit (INDEPENDENT_AMBULATORY_CARE_PROVIDER_SITE_OTHER): Payer: No Typology Code available for payment source | Admitting: Neurology

## 2023-05-17 ENCOUNTER — Encounter: Payer: Self-pay | Admitting: Neurology

## 2023-05-17 VITALS — BP 133/70 | HR 95 | Ht 69.0 in | Wt 213.2 lb

## 2023-05-17 DIAGNOSIS — E1169 Type 2 diabetes mellitus with other specified complication: Secondary | ICD-10-CM

## 2023-05-17 DIAGNOSIS — G43009 Migraine without aura, not intractable, without status migrainosus: Secondary | ICD-10-CM | POA: Diagnosis not present

## 2023-05-17 DIAGNOSIS — Q282 Arteriovenous malformation of cerebral vessels: Secondary | ICD-10-CM | POA: Diagnosis not present

## 2023-05-17 LAB — HEMOGLOBIN A1C
Est. average glucose Bld gHb Est-mCnc: 134 mg/dL
Hgb A1c MFr Bld: 6.3 % — ABNORMAL HIGH (ref 4.8–5.6)

## 2023-05-17 MED ORDER — ACETAZOLAMIDE 250 MG PO TABS: 250.0000 mg | ORAL_TABLET | Freq: Two times a day (BID) | ORAL | 2 refills | Status: DC

## 2023-05-17 MED ORDER — EMGALITY 120 MG/ML ~~LOC~~ SOAJ: 120.0000 mg | SUBCUTANEOUS | 11 refills | Status: DC

## 2023-05-17 MED ORDER — TOPIRAMATE 50 MG PO TABS: ORAL_TABLET | ORAL | 1 refills | Status: DC

## 2023-05-17 NOTE — Patient Instructions (Signed)
Emgality, topiramate, acetazolamide Take Bernita Raisin earliest onset of migraine.  May repeat after 2 hours.  Maximum 2 tablets in 24 hours.  Let me know if it works.  If not, will retry approval for Nurtec Follow up 6 months.

## 2023-05-18 ENCOUNTER — Other Ambulatory Visit: Payer: Self-pay | Admitting: Neurology

## 2023-05-18 MED ORDER — TOPIRAMATE 50 MG PO TABS: ORAL_TABLET | ORAL | 1 refills | Status: DC

## 2023-05-31 ENCOUNTER — Encounter: Payer: No Typology Code available for payment source | Admitting: Physical Medicine & Rehabilitation

## 2023-06-08 ENCOUNTER — Encounter: Payer: No Typology Code available for payment source | Admitting: Physical Medicine & Rehabilitation

## 2023-06-08 ENCOUNTER — Encounter: Payer: Self-pay | Admitting: Physical Medicine & Rehabilitation

## 2023-06-18 ENCOUNTER — Ambulatory Visit (HOSPITAL_COMMUNITY): Payer: No Typology Code available for payment source | Attending: Cardiology

## 2023-06-18 DIAGNOSIS — I5042 Chronic combined systolic (congestive) and diastolic (congestive) heart failure: Secondary | ICD-10-CM | POA: Diagnosis present

## 2023-06-18 LAB — ECHOCARDIOGRAM COMPLETE
Area-P 1/2: 4.77 cm2
Est EF: 45
P 1/2 time: 160 ms
S' Lateral: 4.1 cm

## 2023-06-20 ENCOUNTER — Other Ambulatory Visit: Payer: Self-pay | Admitting: Neurology

## 2023-07-02 ENCOUNTER — Encounter: Payer: Self-pay | Admitting: Physical Medicine & Rehabilitation

## 2023-07-02 ENCOUNTER — Encounter
Payer: No Typology Code available for payment source | Attending: Physical Medicine & Rehabilitation | Admitting: Physical Medicine & Rehabilitation

## 2023-07-02 VITALS — Ht 69.0 in | Wt 211.0 lb

## 2023-07-02 DIAGNOSIS — M797 Fibromyalgia: Secondary | ICD-10-CM | POA: Diagnosis not present

## 2023-07-02 MED ORDER — HYDROCODONE-ACETAMINOPHEN 5-300 MG PO TABS: 1.0000 | ORAL_TABLET | Freq: Two times a day (BID) | ORAL | 0 refills | Status: DC | PRN

## 2023-07-02 NOTE — Progress Notes (Signed)
Subjective:    Patient ID: Virginia Douglas, female    DOB: 12/24/1975, 47 y.o.   MRN: [id number]  HPI    HPI HPI  09/22/22 Virginia Douglas is a 47 y.o. year old female  who  has a past medical history of ADHD (attention deficit hyperactivity disorder), Anemia, Anxiety, Arthritis, Diabetes (HCC), Edema, GERD (gastroesophageal reflux disease), Hypertension, and Migraine headache.   They are presenting to PM&R clinic as a new patient for pain management evaluation. They were referred  for treatment of chronic pain.  She has had chronic pain for many years.  Currently her worst pain is in her lower back.  She reports she recently had worsening of her pain in her shoulder and neck and she says she thinks this is worsened by stress.  Sometimes the pain in her lower back will shoot down her legs.  She reports having history of fibromyalgia.  She often sleeps with multiple pillows to help take the pressure off her joints.  She also wears a back brace during the day.  [facility]  She reports multiple ESI's and these did help the pain however there caused a lot of weight gain.  She reports that she was previously on hydrocodone however she stopped following with her pain doctor because they kept pushing her to do back injections and she was unhappy with the weight gain which she felt was associated with these injections.  She has very poor sleep.  Ports her mood is okay currently although has had depression in the past.  She has been seen by Dr. Christell Constant orthopedics for her neck and shoulder pain.  She does report some altered sensation in her hands and feet, she has a history of diabetes mellitus however this is not causing her significant pain.   She currently has a heart monitor in place due to concerns of arrhythmia.   She is followed by rheumatology for Raynaud's disease, polyarthralgia, sicca complex.   Red flag symptoms: Patient denies  saddle anesthesia, loss of bowel or bladder continence, new weakness, new numbness/tingling, or pain waking up at nighttime.   Medications tried: Tylenol and NSAIDS minimal benefit Takes Tumaric Butrans- minimal benefit Tramadol didn't help Gabapentin and Lyrica didn't help Hydrocodone was helping the pain Has not tried Tylenol 3 Tizanidine did not help her pain in the past.       Other treatments: Tens minimal help Epidurals helped but caused weight gain [facility] Br[facility]   Interval history 11/23/22 [name] is here regarding follow-up of her chronic pain.  She continues to use Cymbalta and tizanidine, these medications are not keeping her pain controlled currently.  She continues to have severe pain in her back, shoulders, neck, legs.  ESI's did help in the past however it caused significant weight gain and she would not like to repeat this.  She feels that her activities continue to be severely limited by her pain.  She reports blood glucose has been elevated recently and she had a recent admission for DKA.     Interval history 12/19/2022 [name] is here for follow-up of her chronic pain.  Her worst pain is currently in her shoulders and lower back today.  Current medications of Cymbalta and tizanidine are not keeping his pain well-controlled.  She again reports she wishes she could complete ESI's however this resulted in significant weight gain.  She has had a  busy work schedule and has been under a lot of stress.  She is trying to get scheduled for physical therapy and is trying to work around her schedule.     Interval history 02/02/23 [name] reports poor results with tylenol #3, she says this provides minimal benefit for her pain. Denies any side effects with the medications.  She has not had PT recently, it helped in the past.  Norco medication was helpful for her pain in the past.    Interval history 03/30/23 Patient is here to  follow-up regarding her chronic pain.  She feels that Norco is helping more than Tylenol 3 and helping keep the pain under better control overall.  It allows her to be more active and is not causing any significant side effects.  She reports she was unable to do physical therapy due to scheduling issues.  She is getting a standing desk for her work.  She also reports follow-up with rheumatology scheduled, she noted a rash on her face recently and is being screened for additional rheumatological conditions.  She denies recent alcohol use.    Interval history 07/02/23 Patient seen by video visit today.  She continues to have a lot of pain throughout her body but worse in her lower back, legs and hips recently.  Norco 5 mg does help reduce her pain and make it more tolerable.  No side effects with the medication.  She has recently moved so would like a location closer to her new home for aquatic therapy.  Pain Inventory Average Pain 8 Pain Right Now 9 My pain is constant, sharp, dull, and aching  In the last 24 hours, has pain interfered with the following? General activity 7 Relation with others 0 Enjoyment of life 0 What TIME of day is your pain at its worst? varies Sleep (in general) Poor  Pain is worse with: walking, bending, sitting, standing, and some activites Pain improves with: rest, heat/ice, and medication Relief from Meds: 4  Family History  Problem Relation Age of Onset   Diabetes Mother    Hypertension Mother    Diabetes Father    Hypertension Father    Kidney failure Father    Hypertension Brother    Cancer Maternal Grandmother    Diabetes Maternal Grandmother    Pancreatic cancer Maternal Grandmother    Diabetes Paternal Grandmother    Breast cancer Paternal Grandmother    Stroke Paternal Grandfather    Healthy Son    Healthy Son    Cancer Maternal Aunt        breast   Breast cancer Maternal Aunt    Social History   Socioeconomic History   Marital status:  Legally Separated    Spouse name: Fayrene Fearing   Number of children: 2   Years of education: 16   Highest education level: Associate degree: occupational, Scientist, product/process development, or vocational program  Occupational History   Occupation: Geophysical data processor: [employer]  Tobacco Use   Smoking status: Former    Current packs/day: 0.50    Average packs/day: 0.5 packs/day for 25.3 years (12.6 ttl pk-yrs)    Types: Cigarettes    Start date: 03/24/2019    Passive exposure: Current   Smokeless tobacco: Never   Tobacco comments:    06/24/21- quit over a year ago  Vaping Use   Vaping status: Never Used  Substance and Sexual Activity   Alcohol use: No    Alcohol/week: 0.0 standard drinks of alcohol  Drug use: No   Sexual activity: Yes    Birth control/protection: Surgical  Other Topics Concern   Not on file  Social History Narrative   In relationship, 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 She lives with her 2 children in a one story house. She drinks one large cup of coffee a day and an occasional tea or soda. She walks daily.      One story home      Social Determinants of Health   Financial Resource Strain: Low Risk  (01/15/2023)   Overall Financial Resource Strain (CARDIA)    Difficulty of Paying Living Expenses: Not very hard  Food Insecurity: No Food Insecurity (01/15/2023)   Hunger Vital Sign    Worried About Running Out of Food in the Last Year: Never true    Ran Out of Food in the Last Year: Never true  Transportation Needs: No Transportation Needs (01/15/2023)   PRAPARE - Administrator, Civil Service (Medical): No    Lack of Transportation (Non-Medical): No  Physical Activity: Insufficiently Active (01/15/2023)   Exercise Vital Sign    Days of Exercise per Week: 1 day    Minutes of Exercise per Session: 30  min  Stress: Stress Concern Present (01/15/2023)   [employer] of Occupational Health - Occupational Stress Questionnaire    Feeling of Stress : Rather much  Social Connections: Moderately Integrated (01/15/2023)   Social Connection and Isolation Panel [NHANES]    Frequency of Communication with Friends and Family: Three times a week    Frequency of Social Gatherings with Friends and Family: Once a week    Attends Religious Services: 1 to 4 times per year    Active Member of Clubs or Organizations: Yes    Attends Banker Meetings: 1 to 4 times per year    Marital Status: Divorced   Past Surgical History:  Procedure Laterality Date   ABDOMINAL HYSTERECTOMY  2012   CHOLECYSTECTOMY     COLONOSCOPY  12/28/2020   polyps   ESOPHAGEAL DILATION     gamma knife     04/2022   IR ANGIO INTRA EXTRACRAN SEL COM CAROTID INNOMINATE BILAT MOD SED  05/31/2021   IR ANGIO INTRA EXTRACRAN SEL COM CAROTID INNOMINATE BILAT MOD SED  06/27/2021   IR ANGIO INTRA EXTRACRAN SEL COM CAROTID INNOMINATE UNI R MOD SED  11/21/2021   IR ANGIO VERTEBRAL SEL VERTEBRAL UNI R MOD SED  05/31/2021   IR CT HEAD LTD  06/27/2021   IR CT HEAD LTD  11/21/2021   IR INTRA CRAN STENT  06/27/2021   IR INTRA CRAN STENT  11/21/2021   IR RADIOLOGIST EVAL & MGMT  06/03/2021   IR RADIOLOGIST EVAL & MGMT  07/13/2021   IR RADIOLOGIST EVAL & MGMT  08/27/2021   IR RADIOLOGIST EVAL & MGMT  10/30/2021   IR RADIOLOGIST EVAL & MGMT  12/06/2021   IR US GUIDE VASC ACCESS RIGHT  05/31/2021   IR US GUIDE VASC ACCESS RIGHT  06/27/2021   IR US GUIDE VASC ACCESS RIGHT  11/21/2021   OVARIAN CYST REMOVAL     RADIOLOGY WITH ANESTHESIA N/A 06/27/2021   Procedure: STENTING;  Surgeon: Julieanne Cotton, MD;  Location: [facility] OR;  Service: Radiology;  Laterality: N/A;   RADIOLOGY WITH ANESTHESIA N/A 11/21/2021  Procedure: IR WITH ANESTHESIA STENTING;  Surgeon: Julieanne Cotton, MD;  Location: [facility] OR;  Service: Radiology;  Laterality:  N/A;   Past Surgical History:  Procedure Laterality Date   ABDOMINAL HYSTERECTOMY  2012   CHOLECYSTECTOMY     COLONOSCOPY  12/28/2020   polyps   ESOPHAGEAL DILATION     gamma knife     04/2022   IR ANGIO INTRA EXTRACRAN SEL COM CAROTID INNOMINATE BILAT MOD SED  05/31/2021   IR ANGIO INTRA EXTRACRAN SEL COM CAROTID INNOMINATE BILAT MOD SED  06/27/2021   IR ANGIO INTRA EXTRACRAN SEL COM CAROTID INNOMINATE UNI R MOD SED  11/21/2021   IR ANGIO VERTEBRAL SEL VERTEBRAL UNI R MOD SED  05/31/2021   IR CT HEAD LTD  06/27/2021   IR CT HEAD LTD  11/21/2021   IR INTRA CRAN STENT  06/27/2021   IR INTRA CRAN STENT  11/21/2021   IR RADIOLOGIST EVAL & MGMT  06/03/2021   IR RADIOLOGIST EVAL & MGMT  07/13/2021   IR RADIOLOGIST EVAL & MGMT  08/27/2021   IR RADIOLOGIST EVAL & MGMT  10/30/2021   IR RADIOLOGIST EVAL & MGMT  12/06/2021   IR US GUIDE VASC ACCESS RIGHT  05/31/2021   IR US GUIDE VASC ACCESS RIGHT  06/27/2021   IR US GUIDE VASC ACCESS RIGHT  11/21/2021   OVARIAN CYST REMOVAL     RADIOLOGY WITH ANESTHESIA N/A 06/27/2021   Procedure: STENTING;  Surgeon: Julieanne Cotton, MD;  Location: [facility];  Service: Radiology;  Laterality: N/A;   RADIOLOGY WITH ANESTHESIA N/A 11/21/2021   Procedure: IR WITH ANESTHESIA STENTING;  Surgeon: Julieanne Cotton, MD;  Location: [facility] OR;  Service: Radiology;  Laterality: N/A;   Past Medical History:  Diagnosis Date   Acute pain of left shoulder 06/05/2019   ADHD (attention deficit hyperactivity disorder)    Anemia    iron deficiency   as a teenager   Anxiety    Arthritis    Chronic combined systolic and diastolic congestive heart failure (HCC) 11/23/2022   This is a new diagnosis with Cardiology, she is scheduled on 2/14 for her MRI of her heart.     Diabetes (HCC)    Edema    GERD (gastroesophageal reflux disease)    History of congestive heart failure    dx by cardiology   Hypertension    patient denies- patient stated that the PCP said it was for  heart prevention.   Left leg pain 04/07/2019   LLQ pain 12/08/2015   Migraine headache    Ht 5\' 9"  (1.753 m)   Wt 211 lb (95.7 kg)   BMI 31.16 kg/m   Opioid Risk Score:   Fall Risk Score:  `1  Depression screen PHQ 2/9     07/02/2023    2:12 PM 04/18/2023    2:22 PM 03/30/2023   10:51 AM 01/15/2023    5:00 PM 12/19/2022    9:45 AM 11/08/2022    4:20 PM 10/11/2022    3:09 PM  Depression screen PHQ 2/9  Decreased Interest 0 0 0 0 0 0 0  Down, Depressed, Hopeless 0 0 0 0 0 0 0  PHQ - 2 Score 0 0 0 0 0 0 0  Altered sleeping  0  3     Tired, decreased energy  0  1     Change in appetite  0  0     Feeling bad or failure about yourself   0  0  Trouble concentrating  0  1     Moving slowly or fidgety/restless  0  0     Suicidal thoughts  0  0     PHQ-9 Score  0  5     Difficult doing work/chores  Not difficult at all  Not difficult at all         Review of Systems  Musculoskeletal:  Positive for back pain and neck pain.       B/L hip and shoulder   All other systems reviewed and are negative.     Objective:   Physical Exam   Video visit  Gen: no distress, normal appearing HEENT: oral mucosa pink and moist, NCAT Chest: normal effort, normal rate of breathing Psych: Appropriate cooperative Skin: intact Neuro: Alert and oriented, follows commands, cranial nerves II through XII grossly intact, normal speech and language Moving all 4 extremities to gravity and resistance       MRI L spine  Segmentation: The lowest lumbar type non-rib-bearing vertebra is labeled as L5.   Alignment:  Mild levoconvex lumbar scoliosis without subluxation.   Vertebrae:  No significant vertebral marrow edema is identified.   Conus medullaris and cauda equina: Conus extends to the L1 level. Conus and cauda equina appear normal.   Paraspinal and other soft tissues: Unremarkable   Disc levels:   T12-L1: Unremarkable.   L1-2: Unremarkable.   L2-3: Minimal disc bulge, no  impingement.   L3-4: Minimal disc bulge, no impingement.   L4-5: Mild disc bulge, no impingement.   L5-S1: Borderline bilateral foraminal stenosis due to disc bulge and mild facet arthropathy   IMPRESSION: 1. Mild lumbar spondylosis and degenerative disc disease, causing borderline bilateral foraminal stenosis at L5-S1. 2. Mild levoconvex lumbar scoliosis without subluxation.     Performed     MRI C-spine 07/16/2022 1. Stable appearance of the cervical spine. 2. Minimal uncovertebral spurring at C6-7 and C7-T1 without significant stenosis at these levels. 3. Moderate right foraminal stenosis at T2-3 due to facet spurring. 4. Empty sella. This is nonspecific and most often incidental, but can be seen in the setting of idiopathic intracranial hypertension.      Assessment & Plan:   Chronic lower back pain and chronic neck pain -Prior MRI with minimal cervical spondylosis and mild lumbar spondylosis with mild degenerative disc disease resulting in borderline bilateral foraminal stenosis L5-S1, mild lumbar scoliosis -Continue tizanidine  -Continue Norco 5 BID PRN #[id number], advised to call when she is about 5 days away from needing refill -Patient reports poor results with Tylenol 3-this was discontinued prior visit  -Provided list of foods that are good for chronic pain prior visit -Opiate risk tool moderate -UDS today -Continue to monitor PDMP, pill counts -Pain agreement completed prior visit     Fibromyalgia -Continue duloxetine, appears this medicine is helping improve her pain -She reports poor response to gabapentin and Lyrica -Discussed gradual progressive low impact exercise -Counseled regarding sleep hygiene -Low-dose naltrexone could be considered, this would not be beneficial if she was on opioid medications -Previously discussed trigger point injections, consider completing at a later visit -Discussed option to try shockwave therapy -Discussed aquatic  therapy-consult to [facility] for [facility] Therapy placed     Depression -Improved with duloxetine -Denies SI or HI, she reports mood is stable   ADHD -She is on Adderall currently   History of AVM -Continue follow-up with neurosurgery, SP gamma knife radiation surgery plan for repeat brain MRI in 6 months

## 2023-07-03 ENCOUNTER — Ambulatory Visit: Payer: No Typology Code available for payment source | Attending: Cardiology | Admitting: Cardiology

## 2023-07-03 ENCOUNTER — Encounter: Payer: Self-pay | Admitting: Cardiology

## 2023-07-03 VITALS — BP 128/72 | HR 68 | Ht 66.0 in | Wt 212.0 lb

## 2023-07-03 DIAGNOSIS — I5042 Chronic combined systolic (congestive) and diastolic (congestive) heart failure: Secondary | ICD-10-CM | POA: Diagnosis not present

## 2023-07-03 DIAGNOSIS — R002 Palpitations: Secondary | ICD-10-CM

## 2023-07-03 DIAGNOSIS — I10 Essential (primary) hypertension: Secondary | ICD-10-CM | POA: Diagnosis not present

## 2023-07-03 DIAGNOSIS — E785 Hyperlipidemia, unspecified: Secondary | ICD-10-CM | POA: Diagnosis not present

## 2023-07-03 NOTE — Progress Notes (Signed)
Cardiology Office Note:    Date:  07/03/2023   ID:  [name], DOB 01/31/1976, MRN [id number]  PCP:  Arnette Felts, FNP  Cardiologist:  Little Ishikawa, MD  Electrophysiologist:  None   Referring MD: Arnette Felts, FNP   Chief Complaint  Patient presents with   Congestive Heart Failure    History of Present Illness:    Virginia Douglas is a 47 y.o. female with a hx of recently diagnosed combined systolic and diastolic heart failure, GERD, hypertension, diabetes who presents for follow-up.  She was referred by Arnette Felts, NP for evaluation of abnormal EKG, initially seen on 08/07/2022.  She reports having chest pain which occurs about once per week.  Describes as sharp pain in center of her chest, lasts for 30 seconds or so.  She does not exercise but reports she is short of breath with minimal exertion, such as walking up stairs.  She reports having lightheadedness but denies any recent syncope.  Does report lower extremity swelling.  Reports palpitations where feels like heart is racing, occurs about once every 2 weeks.  She smoked 1 pack/day x 16 years, quit age 48.  No drug use.  Alcohol on special occasions.  Family history includes father had CVA age 36.  Echocardiogram 09/01/2022 showed EF 30 to 35%, normal RV function, no significant valvular disease.  Coronary CTA on 08/25/2022 showed normal coronary arteries.  Cardiac MRI 10/18/2022 showed LVEF 44%, RVEF 52%, no LGE.  Zio patch x 13 days 09/2022 showed no significant arrhythmias.  Echocardiogram 06/18/2023 showed EF 45%, normal RV function, mild MR/AI.  Since last clinic visit, she reports she has been doing okay.  Continues to have some chest pain but describes more like palpitations.  Does report some lightheadedness, checked BP when lightheaded and was normal.  She denies any syncope.  She denies any dyspnea.  She continues to have some swelling in her legs.  She walks 30 minutes up to 3 days/week.  Wt  Readings from Last 3 Encounters:  07/03/23 212 lb (96.2 kg)  07/02/23 211 lb (95.7 kg)  05/17/23 213 lb 3.2 oz (96.7 kg)   BP Readings from Last 3 Encounters:  07/03/23 128/72  05/17/23 133/70  04/18/23 130/81     Past Medical History:  Diagnosis Date   Acute pain of left shoulder 06/05/2019   ADHD (attention deficit hyperactivity disorder)    Anemia    iron deficiency   as a teenager   Anxiety    Arthritis    Chronic combined systolic and diastolic congestive heart failure (HCC) 11/23/2022   This is a new diagnosis with Cardiology, she is scheduled on 2/14 for her MRI of her heart.     Diabetes (HCC)    Edema    GERD (gastroesophageal reflux disease)    History of congestive heart failure    dx by cardiology   Hypertension    patient denies- patient stated that the PCP said it was for heart prevention.   Left leg pain 04/07/2019   LLQ pain 12/08/2015   Migraine headache     Past Surgical History:  Procedure Laterality Date   ABDOMINAL HYSTERECTOMY  2012   CHOLECYSTECTOMY     COLONOSCOPY  12/28/2020   polyps   ESOPHAGEAL DILATION     gamma knife     04/2022   IR ANGIO INTRA EXTRACRAN SEL COM CAROTID INNOMINATE BILAT MOD SED  05/31/2021   IR ANGIO INTRA EXTRACRAN SEL COM CAROTID INNOMINATE  BILAT MOD SED  06/27/2021   IR ANGIO INTRA EXTRACRAN SEL COM CAROTID INNOMINATE UNI R MOD SED  11/21/2021   IR ANGIO VERTEBRAL SEL VERTEBRAL UNI R MOD SED  05/31/2021   IR CT HEAD LTD  06/27/2021   IR CT HEAD LTD  11/21/2021   IR INTRA CRAN STENT  06/27/2021   IR INTRA CRAN STENT  11/21/2021   IR RADIOLOGIST EVAL & MGMT  06/03/2021   IR RADIOLOGIST EVAL & MGMT  07/13/2021   IR RADIOLOGIST EVAL & MGMT  08/27/2021   IR RADIOLOGIST EVAL & MGMT  10/30/2021   IR RADIOLOGIST EVAL & MGMT  12/06/2021   IR US GUIDE VASC ACCESS RIGHT  05/31/2021   IR US GUIDE VASC ACCESS RIGHT  06/27/2021   IR US GUIDE VASC ACCESS RIGHT  11/21/2021   OVARIAN CYST REMOVAL     RADIOLOGY WITH ANESTHESIA  N/A 06/27/2021   Procedure: STENTING;  Surgeon: Julieanne Cotton, MD;  Location: [facility] OR;  Service: Radiology;  Laterality: N/A;   RADIOLOGY WITH ANESTHESIA N/A 11/21/2021   Procedure: IR WITH ANESTHESIA STENTING;  Surgeon: Julieanne Cotton, MD;  Location: [facility] OR;  Service: Radiology;  Laterality: N/A;    Current Medications: Current Meds  Medication Sig   acetaZOLAMIDE (DIAMOX) 250 MG tablet Take 1 tablet (250 mg total) by mouth 2 (two) times daily.   ADDERALL XR 30 MG 24 hr capsule Take 30 mg by mouth daily.   albuterol (VENTOLIN HFA) 108 (90 Base) MCG/ACT inhaler Inhale 2 puffs into the lungs as needed.   aspirin EC 81 MG tablet Take 81 mg by mouth daily. Swallow whole.   atorvastatin (LIPITOR) 10 MG tablet TAKE 1 TABLET BY MOUTH EVERY DAY   Blood Pressure Monitoring Soln KIT    buPROPion (WELLBUTRIN XL) 150 MG 24 hr tablet TAKE 1 TABLET BY MOUTH EVERY DAY IN THE MORNING   carvedilol (COREG) 3.125 MG tablet Take 2 tablets (6.25 mg total) by mouth 2 (two) times daily.   cetirizine (ZYRTEC) 10 MG tablet Take 10 mg by mouth daily as needed for allergies.   clonazePAM (KLONOPIN) 0.5 MG tablet Take 1 tablet (0.5 mg total) by mouth daily as needed.   clopidogrel (PLAVIX) 75 MG tablet Take 1 tablet (75 mg total) by mouth daily.   DULoxetine (CYMBALTA) 60 MG capsule TAKE 1 CAPSULE BY MOUTH EVERY DAY   empagliflozin (JARDIANCE) 10 MG TABS tablet Take 1 tablet by mouth once daily in the morning   eszopiclone (LUNESTA) 2 MG TABS tablet Take 1 tablet (2 mg total) by mouth at bedtime. Take immediately before bedtime   famotidine (PEPCID) 20 MG tablet Take 20 mg by mouth daily.   Galcanezumab-gnlm (EMGALITY) 120 MG/ML SOAJ Inject 120 mg into the skin every 30 (thirty) days.   guanFACINE (INTUNIV) 1 MG TB24 ER tablet Take 1 mg by mouth daily.   HYDROcodone-Acetaminophen 5-300 MG TABS Take 1 tablet by mouth every 12 (twelve) hours as needed.   insulin aspart (FIASP FLEXTOUCH) 100 UNIT/ML FlexTouch Pen  INJECT [facility] INSULIN BEFORE MEALS AND AT HS IF BS < 150 - 0 UNITS, 150-199 - 3 UNITS, 200-249 - 6 UNITS, 250-299 - 9 UNITS, 300-349 - 12 UNITS, >350 - 15 UNITS. IF >400 CALL OUR OFFICE WITH YOUR BLOOD SUGAR READINGS.   Insulin Pen Needle (PEN NEEDLES) 32G X 4 MM MISC 1 each by Does not apply route once a week.   Lancets (ONETOUCH DELICA PLUS LANCET33G) MISC Apply 1 each topically 3 (  three) times daily.   LINZESS 72 MCG capsule TAKE 1 CAPSULE BY MOUTH DAILY BEFORE BREAKFAST.   phentermine (ADIPEX-P) 37.5 MG tablet Take 1 tablet (37.5 mg total) by mouth daily before breakfast.   Polyethyl Glycol-Propyl Glycol (SYSTANE) 0.4-0.3 % SOLN Place 1 drop into both eyes daily as needed (dry eyes).   sacubitril-valsartan (ENTRESTO) 24-26 MG Take 1 tablet by mouth 2 (two) times daily.   spironolactone (ALDACTONE) 25 MG tablet Take 0.5 tablets (12.5 mg total) by mouth daily.   tirzepatide (MOUNJARO) 7.5 MG/0.5ML Pen Inject 7.5 mg into the skin once a week.   tiZANidine (ZANAFLEX) 4 MG tablet TAKE 1 TABLET (4 MG TOTAL) BY MOUTH EVERY 8 (EIGHT) HOURS AS NEEDED FOR MUSCLE SPASMS   topiramate (TOPAMAX) 50 MG tablet TAKE 2 AND 1/2 TABLETS (125 MG TOTAL) BY MOUTH AT BEDTIME. Strength: 50 mg   Vitamin D, Ergocalciferol, (DRISDOL) 1.25 MG (50000 UNIT) CAPS capsule TAKE 1 CAPSULE BY MOUTH EVERY 7 DAYS **NOT COVERED**     Allergies:   Patient has no known allergies.   Social History   Socioeconomic History   Marital status: Legally Separated    Spouse name: Fayrene Fearing   Number of children: 2   Years of education: 16   Highest education level: Associate degree: occupational, Scientist, product/process development, or vocational program  Occupational History   Occupation: Geophysical data processor: [employer]  Tobacco Use   Smoking status: Former    Current packs/day: 0.50    Average packs/day: 0.5 packs/day for 25.3 years (12.6 ttl pk-yrs)    Types: Cigarettes    Start date: 03/24/2019    Passive exposure: Current    Smokeless tobacco: Never   Tobacco comments:    06/24/21- quit over a year ago  Vaping Use   Vaping status: Never Used  Substance and Sexual Activity   Alcohol use: No    Alcohol/week: 0.0 standard drinks of alcohol   Drug use: No   Sexual activity: Yes    Birth control/protection: Surgical  Other Topics Concern   Not on file  Social History Narrative   In relationship, 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 She lives with her 2 children in a one story house. She drinks one large cup of coffee a day and an occasional tea or soda. She walks daily.      One story home      Social Determinants of Health   Financial Resource Strain: Low Risk  (01/15/2023)   Overall Financial Resource Strain (CARDIA)    Difficulty of Paying Living Expenses: Not very hard  Food Insecurity: No Food Insecurity (01/15/2023)   Hunger Vital Sign    Worried About Running Out of Food in the Last Year: Never true    Ran Out of Food in the Last Year: Never true  Transportation Needs: No Transportation Needs (01/15/2023)   PRAPARE - Administrator, Civil Service (Medical): No    Lack of Transportation (Non-Medical): No  Physical Activity: Insufficiently Active (01/15/2023)   Exercise Vital Sign    Days of Exercise per Week: 1 day    Minutes of Exercise per Session: 30 min  Stress: Stress Concern Present (01/15/2023)   [employer] of Occupational Health - Occupational Stress Questionnaire    Feeling of Stress : Rather much  Social Connections: Moderately Integrated (01/15/2023)   Social Connection and Isolation Panel [NHANES]    Frequency of Communication with Friends and Family: Three times a week    Frequency of Social Gatherings with Friends and Family: Once a week    Attends Religious Services: 1 to 4 times per year    Active Member of [employer] or  Organizations: Yes    Attends Engineer, structural: 1 to 4 times per year    Marital Status: Divorced     Family History: The patient's family history includes Breast cancer in her maternal aunt and paternal grandmother; Cancer in her maternal aunt and maternal grandmother; Diabetes in her father, maternal grandmother, mother, and paternal grandmother; Healthy in her son and son; Hypertension in her brother, father, and mother; Kidney failure in her father; Pancreatic cancer in her maternal grandmother; Stroke in her paternal grandfather.  ROS:   Please see the history of present illness.     All other systems reviewed and are negative.  EKGs/Labs/Other Studies Reviewed:    The following studies were reviewed today:   EKG:   07/25/2022: Normal sinus rhythm, rate 89, LVH 07/03/23: NSR, rate 76, LVH  Recent Labs: 01/15/2023: TSH 0.543 04/02/2023: ALT 13; BUN 8; Creat 0.64; Hemoglobin 13.1; Platelets 320; Potassium 4.1; Sodium 142  Recent Lipid Panel    Component Value Date/Time   CHOL 135 01/15/2023 1705   TRIG 90 01/15/2023 1705   HDL 44 01/15/2023 1705   CHOLHDL 3.1 01/15/2023 1705   LDLCALC 74 01/15/2023 1705    Physical Exam:    VS:  BP 128/72 (BP Location: Right Arm, Patient Position: Sitting, Cuff Size: Normal)   Pulse 68   Ht 5\' 6"  (1.676 m)   Wt 212 lb (96.2 kg)   SpO2 93%   BMI 34.22 kg/m     Wt Readings from Last 3 Encounters:  07/03/23 212 lb (96.2 kg)  07/02/23 211 lb (95.7 kg)  05/17/23 213 lb 3.2 oz (96.7 kg)     GEN:  Well nourished, well developed in no acute distress HEENT: Normal NECK: No JVD; No carotid bruits LYMPHATICS: No lymphadenopathy CARDIAC: RRR, no murmurs, rubs, gallops RESPIRATORY:  Clear to auscultation without rales, wheezing or rhonchi  ABDOMEN: Soft, non-tender, non-distended MUSCULOSKELETAL:  No edema; No deformity  SKIN: Warm and dry NEUROLOGIC:  Alert and oriented x 3 PSYCHIATRIC:  Normal affect   ASSESSMENT:     1. Chronic combined systolic (congestive) and diastolic (congestive) heart failure (HCC)   2. Essential hypertension   3. Hyperlipidemia, unspecified hyperlipidemia type   4. Palpitations       PLAN:    Chronic combined heart failure: Echocardiogram 09/01/2022 showed EF 30 to 35%, normal RV function, no significant valvular disease.  Coronary CTA on 08/25/2022 showed normal coronary arteries.  Cardiac MRI 10/18/2022 showed LVEF 44%, RVEF 52%, no LGE. Appears euvolemic.  Echocardiogram 06/18/2023 showed EF 45%, normal RV function, mild MR/AI.   -Appears euvolemic on exam -Continue Coreg 3.125 mg twice daily -Continue Jardiance 10 mg daily. -Continue Entresto 24-26 mg twice daily -Continue spironolactone 12.5 mg daily.  -Check BMET, magnesium  Palpitations: Zio patch x 13 days 09/2022 showed no significant arrhythmias.  Hypertension: On Entresto 24-26 mg twice daily Coreg 3.125 mg twice daily, spironolactone 12.5 mg daily and acetazolamide 250 mg twice daily.  Appears controlled.    Hyperlipidemia: On atorvastatin 10 mg daily.  LDL 74 01/15/2023  T2DM: Admission 10/2022 with DKA.  Started on insulin.  A1c 11.4% 2/9/202, significantly increased from prior A1c 6.7%.  Referred to endocrinology.  A1c 7.9% 01/15/2023, improved to 6.3% 05/17/23  Left sigmoid sinus stenosis: Status post stenting 06/2021.  She is on aspirin and Plavix.  Left superior cerebellar AVM: Underwent gamma knife radiosurgery at [facility]  Idiopathic intracranial hypertension: On Diamox 250 mg twice daily  Snoring/daytime somnolence: No OSA on sleep study 03/2023  Leg pain: Normal ABIs 11/2022  RTC in 6 months   Medication Adjustments/Labs and Tests Ordered: Current medicines are reviewed at length with the patient today.  Concerns regarding medicines are outlined above.  Orders Placed This Encounter  Procedures   Basic metabolic panel   Magnesium   EKG 12-Lead   No orders of the defined types were placed in this  encounter.   Patient Instructions  Medication Instructions:  Continue same medications *If you need a refill on your cardiac medications before your next appointment, please call your pharmacy*   Lab Work: Bmet and magnesium today   Testing/Procedures: None ordered   Follow-Up: At [facility], you and your health needs are our priority.  As part of our continuing mission to provide you with exceptional heart care, we have created designated Provider Care Teams.  These Care Teams include your primary Cardiologist (physician) and Advanced Practice Providers (APPs -  Physician Assistants and Nurse Practitioners) who all work together to provide you with the care you need, when you need it.  We recommend signing up for the patient portal called "MyChart".  Sign up information is provided on this After Visit Summary.  MyChart is used to connect with patients for Virtual Visits (Telemedicine).  Patients are able to view lab/test results, encounter notes, upcoming appointments, etc.  Non-urgent messages can be sent to your provider as well.   To learn more about what you can do with MyChart, go to [email or web address].    Your next appointment:  6 months     Call in Jan to schedule April appointment    Provider:  Dr.Lyly Canizales     Signed, Little Ishikawa, MD  07/03/2023 8:33 AM     [facility]

## 2023-07-03 NOTE — Patient Instructions (Signed)
Medication Instructions:  Continue same medications *If you need a refill on your cardiac medications before your next appointment, please call your pharmacy*   Lab Work: Bmet and magnesium today   Testing/Procedures: None ordered   Follow-Up: At [facility], you and your health needs are our priority.  As part of our continuing mission to provide you with exceptional heart care, we have created designated Provider Care Teams.  These Care Teams include your primary Cardiologist (physician) and Advanced Practice Providers (APPs -  Physician Assistants and Nurse Practitioners) who all work together to provide you with the care you need, when you need it.  We recommend signing up for the patient portal called "MyChart".  Sign up information is provided on this After Visit Summary.  MyChart is used to connect with patients for Virtual Visits (Telemedicine).  Patients are able to view lab/test results, encounter notes, upcoming appointments, etc.  Non-urgent messages can be sent to your provider as well.   To learn more about what you can do with MyChart, go to [email or web address].    Your next appointment:  6 months     Call in Jan to schedule April appointment    Provider:  Dr.Schumann

## 2023-07-04 LAB — BASIC METABOLIC PANEL
BUN/Creatinine Ratio: 5 — ABNORMAL LOW (ref 9–23)
BUN: 4 mg/dL — ABNORMAL LOW (ref 6–24)
CO2: 17 mmol/L — ABNORMAL LOW (ref 20–29)
Calcium: 9.8 mg/dL (ref 8.7–10.2)
Chloride: 108 mmol/L — ABNORMAL HIGH (ref 96–106)
Creatinine, Ser: 0.74 mg/dL (ref 0.57–1.00)
Glucose: 84 mg/dL (ref 70–99)
Potassium: 4.2 mmol/L (ref 3.5–5.2)
Sodium: 141 mmol/L (ref 134–144)
eGFR: 101 mL/min/{1.73_m2} (ref >59)

## 2023-07-04 LAB — MAGNESIUM: Magnesium: 2.2 mg/dL (ref 1.6–2.3)

## 2023-07-29 ENCOUNTER — Other Ambulatory Visit: Payer: Self-pay | Admitting: Nurse Practitioner

## 2023-07-31 ENCOUNTER — Encounter: Payer: Self-pay | Admitting: Neurology

## 2023-07-31 ENCOUNTER — Encounter: Payer: Self-pay | Admitting: Nurse Practitioner

## 2023-08-01 ENCOUNTER — Encounter: Payer: Self-pay | Admitting: Nurse Practitioner

## 2023-08-06 ENCOUNTER — Other Ambulatory Visit: Payer: Self-pay

## 2023-08-06 ENCOUNTER — Other Ambulatory Visit: Payer: Self-pay | Admitting: Nurse Practitioner

## 2023-08-06 ENCOUNTER — Encounter: Payer: Self-pay | Admitting: Physical Medicine & Rehabilitation

## 2023-08-06 DIAGNOSIS — I152 Hypertension secondary to endocrine disorders: Secondary | ICD-10-CM

## 2023-08-06 DIAGNOSIS — I5042 Chronic combined systolic (congestive) and diastolic (congestive) heart failure: Secondary | ICD-10-CM

## 2023-08-06 MED ORDER — CARVEDILOL 3.125 MG PO TABS: 6.2500 mg | ORAL_TABLET | Freq: Two times a day (BID) | ORAL | 3 refills | Status: DC

## 2023-08-07 ENCOUNTER — Other Ambulatory Visit: Payer: Self-pay | Admitting: Physical Medicine & Rehabilitation

## 2023-08-07 MED ORDER — HYDROCODONE-ACETAMINOPHEN 5-325 MG PO TABS: 1.0000 | ORAL_TABLET | Freq: Two times a day (BID) | ORAL | 0 refills | Status: DC | PRN

## 2023-08-08 MED ORDER — CARVEDILOL 3.125 MG PO TABS: 3.1250 mg | ORAL_TABLET | Freq: Two times a day (BID) | ORAL | 3 refills | Status: DC

## 2023-08-08 NOTE — Addendum Note (Signed)
Addended by: Burnetta Sabin on: 08/08/2023 07:57 AM   Modules accepted: Orders

## 2023-08-10 DIAGNOSIS — Z0279 Encounter for issue of other medical certificate: Secondary | ICD-10-CM

## 2023-08-23 ENCOUNTER — Encounter: Payer: Self-pay | Admitting: Physical Medicine & Rehabilitation

## 2023-08-23 ENCOUNTER — Encounter
Payer: No Typology Code available for payment source | Attending: Physical Medicine & Rehabilitation | Admitting: Physical Medicine & Rehabilitation

## 2023-08-23 VITALS — BP 127/79 | HR 87 | Ht 66.0 in | Wt 203.0 lb

## 2023-08-23 DIAGNOSIS — Z5181 Encounter for therapeutic drug level monitoring: Secondary | ICD-10-CM | POA: Diagnosis not present

## 2023-08-23 DIAGNOSIS — R200 Anesthesia of skin: Secondary | ICD-10-CM | POA: Diagnosis present

## 2023-08-23 DIAGNOSIS — G8929 Other chronic pain: Secondary | ICD-10-CM | POA: Insufficient documentation

## 2023-08-23 DIAGNOSIS — Z79891 Long term (current) use of opiate analgesic: Secondary | ICD-10-CM | POA: Insufficient documentation

## 2023-08-23 DIAGNOSIS — M545 Low back pain, unspecified: Secondary | ICD-10-CM | POA: Diagnosis not present

## 2023-08-23 DIAGNOSIS — M797 Fibromyalgia: Secondary | ICD-10-CM | POA: Insufficient documentation

## 2023-08-23 DIAGNOSIS — G894 Chronic pain syndrome: Secondary | ICD-10-CM | POA: Diagnosis present

## 2023-08-23 NOTE — Progress Notes (Signed)
Subjective:    Patient ID: Virginia Douglas, female    DOB: 09-27-1975, 47 y.o.   MRN: [id number]  HPI    HPI HPI  09/22/22 Virginia Douglas is a 47 y.o. year old female  who  has a past medical history of ADHD (attention deficit hyperactivity disorder), Anemia, Anxiety, Arthritis, Diabetes (HCC), Edema, GERD (gastroesophageal reflux disease), Hypertension, and Migraine headache.   They are presenting to PM&R clinic as a new patient for pain management evaluation. They were referred  for treatment of chronic pain.  She has had chronic pain for many years.  Currently her worst pain is in her lower back.  She reports she recently had worsening of her pain in her shoulder and neck and she says she thinks this is worsened by stress.  Sometimes the pain in her lower back will shoot down her legs.  She reports having history of fibromyalgia.  She often sleeps with multiple pillows to help take the pressure off her joints.  She also wears a back brace during the day.  [facility]  She reports multiple ESI's and these did help the pain however there caused a lot of weight gain.  She reports that she was previously on hydrocodone however she stopped following with her pain doctor because they kept pushing her to do back injections and she was unhappy with the weight gain which she felt was associated with these injections.  She has very poor sleep.  Ports her mood is okay currently although has had depression in the past.  She has been seen by Dr. Christell Constant orthopedics for her neck and shoulder pain.  She does report some altered sensation in her hands and feet, she has a history of diabetes mellitus however this is not causing her significant pain.   She currently has a heart monitor in place due to concerns of arrhythmia.   She is followed by rheumatology for Raynaud's disease, polyarthralgia, sicca complex.   Red flag symptoms: Patient denies  saddle anesthesia, loss of bowel or bladder continence, new weakness, new numbness/tingling, or pain waking up at nighttime.   Medications tried: Tylenol and NSAIDS minimal benefit Takes Tumaric Butrans- minimal benefit Tramadol didn't help Gabapentin and Lyrica didn't help Hydrocodone was helping the pain Has not tried Tylenol 3 Tizanidine did not help her pain in the past.       Other treatments: Tens minimal help Epidurals helped but caused weight gain [facility] Brace [facility]   Interval history 11/23/22 Virginia Douglas is here regarding follow-up of her chronic pain.  She continues to use Cymbalta and tizanidine, these medications are not keeping her pain controlled currently.  She continues to have severe pain in her back, shoulders, neck, legs.  ESI's did help in the past however it caused significant weight gain and she would not like to repeat this.  She feels that her activities continue to be severely limited by her pain.  She reports blood glucose has been elevated recently and she had a recent admission for DKA.     Interval history 12/19/2022 Virginia Douglas is here for follow-up of her chronic pain.  Her worst pain is currently in her shoulders and lower back today.  Current medications of Cymbalta and tizanidine are not keeping his pain well-controlled.  She again reports she wishes she could complete ESI's however this resulted in significant weight gain.  She has had a  busy work schedule and has been under a lot of stress.  She is trying to get scheduled for physical therapy and is trying to work around her schedule.     Interval history 02/02/23 Virginia Douglas reports poor results with tylenol #3, she says this provides minimal benefit for her pain. Denies any side effects with the medications.  She has not had PT recently, it helped in the past.  Norco medication was helpful for her pain in the past.    Interval history 03/30/23 Patient is here to  follow-up regarding her chronic pain.  She feels that Norco is helping more than Tylenol 3 and helping keep the pain under better control overall.  It allows her to be more active and is not causing any significant side effects.  She reports she was unable to do physical therapy due to scheduling issues.  She is getting a standing desk for her work.  She also reports follow-up with rheumatology scheduled, she noted a rash on her face recently and is being screened for additional rheumatological conditions.  She denies recent alcohol use.    Interval history 07/02/23 Patient seen by video visit today.  She continues to have a lot of pain throughout her body but worse in her lower back, legs and hips recently.  Norco 5 mg does help reduce her pain and make it more tolerable.  No side effects with the medication.  She has recently moved so would like a location closer to her new home for aquatic therapy.  Interval history 07/02/23 Patient continues to have pain throughout multiple areas of her body including her shoulders, knees, lower back.  Her worst pain is in her lower back.  She reports that lower back pain has worsened over the past few years.  She has been unable to complete aquatic therapy due to her schedule.  Norco 5 as needed continues to help her pain, no side effects reported.   She reports having numbness in her hand when she wakes up particular on the right side.  Numbness improves as she goes through her day.    Pain Inventory Average Pain 9 Pain Right Now 7 My pain is constant, sharp, burning, dull, stabbing, tingling, and aching  In the last 24 hours, has pain interfered with the following? General activity 8 Relation with others 8 Enjoyment of life 8 What TIME of day is your pain at its worst? varies Sleep (in general) Poor  Pain is worse with: walking, bending, sitting, standing, and some activites Pain improves with: rest, heat/ice, and medication Relief from Meds:  3  Family History  Problem Relation Age of Onset   Diabetes Mother    Hypertension Mother    Diabetes Father    Hypertension Father    Kidney failure Father    Hypertension Brother    Cancer Maternal Grandmother    Diabetes Maternal Grandmother    Pancreatic cancer Maternal Grandmother    Diabetes Paternal Grandmother    Breast cancer Paternal Grandmother    Stroke Paternal Grandfather    Healthy Son    Healthy Son    Cancer Maternal Aunt        breast   Breast cancer Maternal Aunt    Social History   Socioeconomic History   Marital status: Legally Separated    Spouse name: Fayrene Fearing   Number of children: 2   Years of education: 16   Highest education level: Associate degree: occupational, Scientist, product/process development, or vocational program  Occupational History  Occupation: Geophysical data processor: [employer]  Tobacco Use   Smoking status: Former    Current packs/day: 0.50    Average packs/day: 0.5 packs/day for 25.4 years (12.7 ttl pk-yrs)    Types: Cigarettes    Start date: 03/24/2019    Passive exposure: Current   Smokeless tobacco: Never   Tobacco comments:    06/24/21- quit over a year ago  Vaping Use   Vaping status: Never Used  Substance and Sexual Activity   Alcohol use: No    Alcohol/week: 0.0 standard drinks of alcohol   Drug use: No   Sexual activity: Yes    Birth control/protection: Surgical  Other Topics Concern   Not on file  Social History Narrative   In relationship, 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 She lives with her 2 children in a one story house. She drinks one large cup of coffee a day and an occasional tea or soda. She walks daily.      One story home      Social Drivers of Health   Financial Resource Strain: Low Risk  (01/15/2023)   Overall Financial Resource Strain (CARDIA)     Difficulty of Paying Living Expenses: Not very hard  Food Insecurity: No Food Insecurity (01/15/2023)   Hunger Vital Sign    Worried About Running Out of Food in the Last Year: Never true    Ran Out of Food in the Last Year: Never true  Transportation Needs: No Transportation Needs (01/15/2023)   PRAPARE - Administrator, Civil Service (Medical): No    Lack of Transportation (Non-Medical): No  Physical Activity: Insufficiently Active (01/15/2023)   Exercise Vital Sign    Days of Exercise per Week: 1 day    Minutes of Exercise per Session: 30 min  Stress: Stress Concern Present (01/15/2023)   [employer] of Occupational Health - Occupational Stress Questionnaire    Feeling of Stress : Rather much  Social Connections: Moderately Integrated (01/15/2023)   Social Connection and Isolation Panel [NHANES]    Frequency of Communication with Friends and Family: Three times a week    Frequency of Social Gatherings with Friends and Family: Once a week    Attends Religious Services: 1 to 4 times per year    Active Member of Clubs or Organizations: Yes    Attends Banker Meetings: 1 to 4 times per year    Marital Status: Divorced   Past Surgical History:  Procedure Laterality Date   ABDOMINAL HYSTERECTOMY  2012   CHOLECYSTECTOMY     COLONOSCOPY  12/28/2020   polyps   ESOPHAGEAL DILATION     gamma knife     04/2022   IR ANGIO INTRA EXTRACRAN SEL COM CAROTID INNOMINATE BILAT MOD SED  05/31/2021   IR ANGIO INTRA EXTRACRAN SEL COM CAROTID INNOMINATE BILAT MOD SED  06/27/2021   IR ANGIO INTRA EXTRACRAN SEL COM CAROTID INNOMINATE UNI R MOD SED  11/21/2021   IR ANGIO VERTEBRAL SEL VERTEBRAL UNI R MOD SED  05/31/2021   IR CT HEAD LTD  06/27/2021   IR CT HEAD LTD  11/21/2021   IR INTRA CRAN STENT  06/27/2021   IR INTRA CRAN STENT  11/21/2021   IR RADIOLOGIST EVAL & MGMT  06/03/2021   IR RADIOLOGIST EVAL &  MGMT  07/13/2021   IR RADIOLOGIST EVAL & MGMT  08/27/2021   IR  RADIOLOGIST EVAL & MGMT  10/30/2021   IR RADIOLOGIST EVAL & MGMT  12/06/2021   IR US GUIDE VASC ACCESS RIGHT  05/31/2021   IR US GUIDE VASC ACCESS RIGHT  06/27/2021   IR US GUIDE VASC ACCESS RIGHT  11/21/2021   OVARIAN CYST REMOVAL     RADIOLOGY WITH ANESTHESIA N/A 06/27/2021   Procedure: STENTING;  Surgeon: Julieanne Cotton, MD;  Location: [facility] OR;  Service: Radiology;  Laterality: N/A;   RADIOLOGY WITH ANESTHESIA N/A 11/21/2021   Procedure: IR WITH ANESTHESIA STENTING;  Surgeon: Julieanne Cotton, MD;  Location: [facility] OR;  Service: Radiology;  Laterality: N/A;   Past Surgical History:  Procedure Laterality Date   ABDOMINAL HYSTERECTOMY  2012   CHOLECYSTECTOMY     COLONOSCOPY  12/28/2020   polyps   ESOPHAGEAL DILATION     gamma knife     04/2022   IR ANGIO INTRA EXTRACRAN SEL COM CAROTID INNOMINATE BILAT MOD SED  05/31/2021   IR ANGIO INTRA EXTRACRAN SEL COM CAROTID INNOMINATE BILAT MOD SED  06/27/2021   IR ANGIO INTRA EXTRACRAN SEL COM CAROTID INNOMINATE UNI R MOD SED  11/21/2021   IR ANGIO VERTEBRAL SEL VERTEBRAL UNI R MOD SED  05/31/2021   IR CT HEAD LTD  06/27/2021   IR CT HEAD LTD  11/21/2021   IR INTRA CRAN STENT  06/27/2021   IR INTRA CRAN STENT  11/21/2021   IR RADIOLOGIST EVAL & MGMT  06/03/2021   IR RADIOLOGIST EVAL & MGMT  07/13/2021   IR RADIOLOGIST EVAL & MGMT  08/27/2021   IR RADIOLOGIST EVAL & MGMT  10/30/2021   IR RADIOLOGIST EVAL & MGMT  12/06/2021   IR US GUIDE VASC ACCESS RIGHT  05/31/2021   IR US GUIDE VASC ACCESS RIGHT  06/27/2021   IR US GUIDE VASC ACCESS RIGHT  11/21/2021   OVARIAN CYST REMOVAL     RADIOLOGY WITH ANESTHESIA N/A 06/27/2021   Procedure: STENTING;  Surgeon: Julieanne Cotton, MD;  Location: [facility];  Service: Radiology;  Laterality: N/A;   RADIOLOGY WITH ANESTHESIA N/A 11/21/2021   Procedure: IR WITH ANESTHESIA STENTING;  Surgeon: Julieanne Cotton, MD;  Location: [facility] OR;  Service: Radiology;  Laterality: N/A;   Past Medical History:   Diagnosis Date   Acute pain of left shoulder 06/05/2019   ADHD (attention deficit hyperactivity disorder)    Anemia    iron deficiency   as a teenager   Anxiety    Arthritis    Chronic combined systolic and diastolic congestive heart failure (HCC) 11/23/2022   This is a new diagnosis with Cardiology, she is scheduled on 2/14 for her MRI of her heart.     Diabetes (HCC)    Edema    GERD (gastroesophageal reflux disease)    History of congestive heart failure    dx by cardiology   Hypertension    patient denies- patient stated that the PCP said it was for heart prevention.   Left leg pain 04/07/2019   LLQ pain 12/08/2015   Migraine headache    BP 127/79   Pulse 87   Ht 5\' 6"  (1.676 m)   Wt 203 lb (92.1 kg)   SpO2 95%   BMI 32.77 kg/m   Opioid Risk Score:   Fall Risk Score:  `1  Depression screen [facility] 2/9     07/02/2023    2:12 PM 04/18/2023  2:22 PM 03/30/2023   10:51 AM 01/15/2023    5:00 PM 12/19/2022    9:45 AM 11/08/2022    4:20 PM 10/11/2022    3:09 PM  Depression screen PHQ 2/9  Decreased Interest 0 0 0 0 0 0 0  Down, Depressed, Hopeless 0 0 0 0 0 0 0  PHQ - 2 Score 0 0 0 0 0 0 0  Altered sleeping  0  3     Tired, decreased energy  0  1     Change in appetite  0  0     Feeling bad or failure about yourself   0  0     Trouble concentrating  0  1     Moving slowly or fidgety/restless  0  0     Suicidal thoughts  0  0     PHQ-9 Score  0  5     Difficult doing work/chores  Not difficult at all  Not difficult at all         Review of Systems  Musculoskeletal:  Positive for back pain and neck pain.       B/L hip and shoulder   All other systems reviewed and are negative.      Objective:   Physical Exam   Gen: no distress, normal appearing HEENT: oral mucosa pink and moist, NCAT Chest: normal effort, normal rate of breathing Abd: soft, non-distended Ext: no edema Psych: Very pleasant, normal affect Skin: intact Neuro: Alert and oriented, follows  commands, cranial nerves II through XII intact, normal speech and language Sensation intact light touch in all 4 extremities No focal motor deficits Musculoskeletal:  Lumbar paraspinal muscles bilaterally very tender today Facet loading test positive Slump test was negative bilaterally Spurling's test was negative TTP at bilateral shoulders,hips, knees, periscapular muscles to palpation Trigger points noted in bilateral trapezius and periscapular muscles Tinel's test positive at right wrist, sensation and motor function intact throughout her right hand   MRI L spine  Segmentation: The lowest lumbar type non-rib-bearing vertebra is labeled as L5.   Alignment:  Mild levoconvex lumbar scoliosis without subluxation.   Vertebrae:  No significant vertebral marrow edema is identified.   Conus medullaris and cauda equina: Conus extends to the L1 level. Conus and cauda equina appear normal.   Paraspinal and other soft tissues: Unremarkable   Disc levels:   T12-L1: Unremarkable.   L1-2: Unremarkable.   L2-3: Minimal disc bulge, no impingement.   L3-4: Minimal disc bulge, no impingement.   L4-5: Mild disc bulge, no impingement.   L5-S1: Borderline bilateral foraminal stenosis due to disc bulge and mild facet arthropathy   IMPRESSION: 1. Mild lumbar spondylosis and degenerative disc disease, causing borderline bilateral foraminal stenosis at L5-S1. 2. Mild levoconvex lumbar scoliosis without subluxation.     Performed     MRI C-spine 07/16/2022 1. Stable appearance of the cervical spine. 2. Minimal uncovertebral spurring at C6-7 and C7-T1 without significant stenosis at these levels. 3. Moderate right foraminal stenosis at T2-3 due to facet spurring. 4. Empty sella. This is nonspecific and most often incidental, but can be seen in the setting of idiopathic intracranial hypertension.    Knees sore, shoulder shoulders and periscapular muscles, lower back  + facet SLR  negateive Yoga,tai-chair  Took medication s   Hands numb in the AM- cts brace  Tinels positive Call for refill  MRI -injurce      Assessment & Plan:   Chronic lower back  pain and chronic neck pain -Prior MRI with minimal cervical spondylosis and mild lumbar spondylosis with mild degenerative disc disease resulting in borderline bilateral foraminal stenosis L5-S1, mild lumbar scoliosis, mild facet L5-S1 arthropathy -Reports pain has worsened significantly since prior imaging study-reorder MRI-I think this would be beneficial to evaluate for potential interventional options. -Continue tizanidine  -Continue Norco 5 BID PRN #60, advised to call when she is about 5 days away from needing refill -Patient reports poor results with Tylenol 3-this was discontinued prior visit  -Provided list of foods that are good for chronic pain prior visit -Opiate risk tool moderate -UDS today -Continue to monitor PDMP, pill counts -Pain agreement completed prior visit    Fibromyalgia -Continue duloxetine, appears this medicine is helping improve her pain -She reports poor response to gabapentin and Lyrica- considered retrying but she is concenred about wt gain  -Discussed gradual progressive low impact exercise -Counseled regarding sleep hygiene -Low-dose naltrexone could be considered at a later time -Previously discussed trigger point injections, consider completing at a later visit -Discussed option to try shockwave therapy -Discussed aquatic therapy-consult to [facility] placed prior visit-she is unable to complete due to her work schedule -Advised trying Thera cane -Discussed trying yoga or tai chi     Depression -Improved with duloxetine -Denies SI or HI, she reports mood is stable   ADHD -She is on Adderall currently   History of AVM -Continue follow-up with neurosurgery, SP gamma knife radiation surgery plan for repeat brain MRI in 6 months  Right hand  numbness -Potentially carpal tunnel syndrome, advised trying carpal tunnel brace at night -Consider electrodiagnostic study if worsens

## 2023-08-28 LAB — TOXASSURE SELECT,+ANTIDEPR,UR

## 2023-09-06 ENCOUNTER — Other Ambulatory Visit: Payer: Self-pay

## 2023-09-06 MED ORDER — TOPIRAMATE 50 MG PO TABS: ORAL_TABLET | ORAL | 2 refills | Status: DC

## 2023-09-06 NOTE — Telephone Encounter (Signed)
 Per [facility] refills needed for Topiramate 50 mg Take 2 and 1/2 tabs daily.  Refills sent in.

## 2023-09-10 ENCOUNTER — Encounter: Payer: Self-pay | Admitting: Physical Medicine & Rehabilitation

## 2023-09-12 ENCOUNTER — Telehealth: Payer: Self-pay | Admitting: *Deleted

## 2023-09-12 NOTE — Telephone Encounter (Signed)
-----   Message from Murray Collier sent at 09/11/2023  4:03 PM EST ----- Hye. Called pt about THC, she has been using hemp product. Agreed to DC. Can we send her a letter advising to DC use of any such products please. thanks ----- Message ----- From: Rebecka Memos Lab Results In Sent: 08/28/2023   9:36 AM EST To: Murray Collier, MD

## 2023-09-13 ENCOUNTER — Encounter: Payer: Self-pay | Admitting: *Deleted

## 2023-09-15 ENCOUNTER — Other Ambulatory Visit: Payer: No Typology Code available for payment source

## 2023-09-18 ENCOUNTER — Ambulatory Visit
Admission: RE | Admit: 2023-09-18 | Discharge: 2023-09-18 | Disposition: A | Discharge status: Home / Self Care | Payer: No Typology Code available for payment source | Source: Ambulatory Visit | Attending: Physical Medicine & Rehabilitation | Admitting: Physical Medicine & Rehabilitation

## 2023-09-18 DIAGNOSIS — G8929 Other chronic pain: Secondary | ICD-10-CM

## 2023-09-19 NOTE — Progress Notes (Unsigned)
Office Visit Note  Patient: Virginia Douglas             Date of Birth: [birth year]           MRN: [id number]             PCP: Arnette Felts, FNP Referring: Arnette Felts, FNP Visit Date: 10/03/2023 Occupation: @GUAROCC @  Subjective:  Review lab results   History of Present Illness: Virginia Douglas is a 48 y.o. female with history of raynaud's disease and myofascial pain.    Lab work from 04/02/2023 was reviewed today in the office: Complements within normal limits, ANA 1:80 cytoplasmic, double-stranded and negative, vitamin D 36, sed rate 19, CK1 10, RNP negative, Smith negative, Ro antibody negative, La antibody negative, anti-CCP negative, RF negative.     Activities of Daily Living:  Patient reports morning stiffness for *** {minute/hour:19697}.   Patient {ACTIONS;DENIES/REPORTS:21021675::"Denies"} nocturnal pain.  Difficulty dressing/grooming: {ACTIONS;DENIES/REPORTS:21021675::"Denies"} Difficulty climbing stairs: {ACTIONS;DENIES/REPORTS:21021675::"Denies"} Difficulty getting out of chair: {ACTIONS;DENIES/REPORTS:21021675::"Denies"} Difficulty using hands for taps, buttons, cutlery, and/or writing: {ACTIONS;DENIES/REPORTS:21021675::"Denies"}  No Rheumatology ROS completed.   PMFS History:  Patient Active Problem List   Diagnosis Date Noted   HFrEF (heart failure with reduced ejection fraction) (HCC) 11/09/2022   Type 2 diabetes mellitus with obesity (HCC) 11/08/2022   DKA (diabetic ketoacidosis) (HCC) 10/13/2022   Cerebral venous thrombosis of sigmoid sinus 11/21/2021   Pulsatile tinnitus of right ear 11/21/2021   Pulsatile tinnitus, bilateral 06/27/2021   AVM (arteriovenous malformation) brain 05/31/2021   DDD (degenerative disc disease), cervical 12/06/2020   DDD (degenerative disc disease), lumbar 12/06/2020   Gastroesophageal reflux disease with esophagitis without hemorrhage 12/06/2020   Increased intracranial pressure 12/06/2020   PTSD (post-traumatic  stress disorder) 12/06/2020   Attention deficit hyperactivity disorder (ADHD), predominantly inattentive type 12/06/2020   Vitamin D deficiency 12/06/2020   Obesity (BMI 30-39.9) 03/15/2020   Sciatic nerve pain 12/10/2019   Grief 06/05/2019   Migraine 06/05/2019   Type 2 diabetes mellitus with obesity (HCC) 04/07/2019   Depression 04/07/2019   Obesity due to excess calories without serious comorbidity 04/07/2019   Anxiety 04/07/2019   Headache 07/06/2018   Diabetes (HCC) 12/08/2015   Benign hypertensive heart disease with congestive heart failure and with combined systolic and diastolic dysfunction (HCC) 12/08/2015   Paresthesia 09/16/2015   Urinary urgency 09/16/2015    Past Medical History:  Diagnosis Date   Acute pain of left shoulder 06/05/2019   ADHD (attention deficit hyperactivity disorder)    Anemia    iron deficiency   as a teenager   Anxiety    Arthritis    Chronic combined systolic and diastolic congestive heart failure (HCC) 11/23/2022   This is a new diagnosis with Cardiology, she is scheduled on 2/14 for her MRI of her heart.     Diabetes (HCC)    Edema    GERD (gastroesophageal reflux disease)    History of congestive heart failure    dx by cardiology   Hypertension    patient denies- patient stated that the PCP said it was for heart prevention.   Left leg pain 04/07/2019   LLQ pain 12/08/2015   Migraine headache     Family History  Problem Relation Age of Onset   Diabetes Mother    Hypertension Mother    Diabetes Father    Hypertension Father    Kidney failure Father    Hypertension Brother    Cancer Maternal Grandmother    Diabetes Maternal Grandmother  Pancreatic cancer Maternal Grandmother    Diabetes Paternal Grandmother    Breast cancer Paternal Grandmother    Stroke Paternal Grandfather    Healthy Son    Healthy Son    Cancer Maternal Aunt        breast   Breast cancer Maternal Aunt    Past Surgical History:  Procedure Laterality Date    ABDOMINAL HYSTERECTOMY  2012   CHOLECYSTECTOMY     COLONOSCOPY  12/28/2020   polyps   ESOPHAGEAL DILATION     gamma knife     04/2022   IR ANGIO INTRA EXTRACRAN SEL COM CAROTID INNOMINATE BILAT MOD SED  05/31/2021   IR ANGIO INTRA EXTRACRAN SEL COM CAROTID INNOMINATE BILAT MOD SED  06/27/2021   IR ANGIO INTRA EXTRACRAN SEL COM CAROTID INNOMINATE UNI R MOD SED  11/21/2021   IR ANGIO VERTEBRAL SEL VERTEBRAL UNI R MOD SED  05/31/2021   IR CT HEAD LTD  06/27/2021   IR CT HEAD LTD  11/21/2021   IR INTRA CRAN STENT  06/27/2021   IR INTRA CRAN STENT  11/21/2021   IR RADIOLOGIST EVAL & MGMT  06/03/2021   IR RADIOLOGIST EVAL & MGMT  07/13/2021   IR RADIOLOGIST EVAL & MGMT  08/27/2021   IR RADIOLOGIST EVAL & MGMT  10/30/2021   IR RADIOLOGIST EVAL & MGMT  12/06/2021   IR US GUIDE VASC ACCESS RIGHT  05/31/2021   IR US GUIDE VASC ACCESS RIGHT  06/27/2021   IR US GUIDE VASC ACCESS RIGHT  11/21/2021   OVARIAN CYST REMOVAL     RADIOLOGY WITH ANESTHESIA N/A 06/27/2021   Procedure: STENTING;  Surgeon: Julieanne Cotton, MD;  Location: [facility] OR;  Service: Radiology;  Laterality: N/A;   RADIOLOGY WITH ANESTHESIA N/A 11/21/2021   Procedure: IR WITH ANESTHESIA STENTING;  Surgeon: Julieanne Cotton, MD;  Location: [facility] OR;  Service: Radiology;  Laterality: N/A;   Social History   Social History Narrative   In relationship, 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 She lives with her 2 children in a one story house. She drinks one large cup of coffee a day and an occasional tea or soda. She walks daily.      One story home      Immunization History  Administered Date(s) Administered   HPV 9-valent 04/11/2019   Influenza Inj Mdck Quad Pf 05/18/2018   Influenza,inj,Quad PF,6+ Mos 05/20/2017, 05/14/2019, 06/02/2020, 05/09/2022   Influenza-Unspecified 05/11/2017,  05/15/2018, 05/08/2019, 07/13/2021   Janssen (J&J) SARS-COV-2 Vaccination 11/23/2019   PFIZER(Purple Top)SARS-COV-2 Vaccination 07/11/2020   Pfizer(Comirnaty)Fall Seasonal Vaccine 12 years and older 01/15/2023   Pneumococcal Polysaccharide-23 04/08/2019, 07/15/2019   Tdap 07/02/2017, 04/09/2019     Objective: Vital Signs: There were no vitals taken for this visit.   Physical Exam Vitals and nursing note reviewed.  Constitutional:      Appearance: She is well-developed.  HENT:     Head: Normocephalic and atraumatic.  Eyes:     Conjunctiva/sclera: Conjunctivae normal.  Cardiovascular:     Rate and Rhythm: Normal rate and regular rhythm.     Heart sounds: Normal heart sounds.  Pulmonary:     Effort: Pulmonary effort is normal.     Breath sounds: Normal breath sounds.  Abdominal:     General: Bowel sounds are normal.     Palpations:  Abdomen is soft.  Musculoskeletal:     Cervical back: Normal range of motion.  Lymphadenopathy:     Cervical: No cervical adenopathy.  Skin:    General: Skin is warm and dry.     Capillary Refill: Capillary refill takes less than 2 seconds.  Neurological:     Mental Status: She is alert and oriented to person, place, and time.  Psychiatric:        Behavior: Behavior normal.      Musculoskeletal Exam: ***  CDAI Exam: CDAI Score: -- Patient Global: --; Provider Global: -- Swollen: --; Tender: -- Joint Exam 10/03/2023   No joint exam has been documented for this visit   There is currently no information documented on the homunculus. Go to the Rheumatology activity and complete the homunculus joint exam.  Investigation: No additional findings.  Imaging: No results found.  Recent Labs: Lab Results  Component Value Date   WBC 11.1 (H) 04/02/2023   HGB 13.1 04/02/2023   PLT 320 04/02/2023   NA 141 07/03/2023   K 4.2 07/03/2023   CL 108 (H) 07/03/2023   CO2 17 (L) 07/03/2023   GLUCOSE 84 07/03/2023   BUN 4 (L) 07/03/2023    CREATININE 0.74 07/03/2023   BILITOT 0.3 04/02/2023   ALKPHOS 144 (H) 01/15/2023   AST 17 04/02/2023   ALT 13 04/02/2023   PROT 7.3 04/02/2023   ALBUMIN 4.5 01/15/2023   CALCIUM 9.8 07/03/2023   GFRAA 89 07/15/2020    Speciality Comments: No specialty comments available.  Procedures:  No procedures performed Allergies: Patient has no known allergies.   Assessment / Plan:     Visit Diagnoses: Raynaud's disease without gangrene  Myofascial pain  Positive ANA (antinuclear antibody)  Chronic pain of both knees  DDD (degenerative disc disease), cervical  Degeneration of intervertebral disc of lumbar region without discogenic back pain or lower extremity pain  Essential hypertension  Type 2 diabetes mellitus without complication, without long-term current use of insulin (HCC)  Increased intracranial pressure  AVM (arteriovenous malformation) brain  Hx of migraine headaches  Paresthesia  Attention deficit hyperactivity disorder (ADHD), predominantly inattentive type  Gastroesophageal reflux disease with esophagitis without hemorrhage  PTSD (post-traumatic stress disorder)  Anxiety and depression  Former smoker  History of chronic CHF  Vitamin D deficiency  Other fatigue  Orders: No orders of the defined types were placed in this encounter.  No orders of the defined types were placed in this encounter.   Face-to-face time spent with patient was *** minutes. Greater than 50% of time was spent in counseling and coordination of care.  Follow-Up Instructions: No follow-ups on file.   Gearldine Bienenstock, PA-C  Note - This record has been created using Dragon software.  Chart creation errors have been sought, but may not always  have been located. Such creation errors do not reflect on  the standard of medical care.

## 2023-10-03 ENCOUNTER — Ambulatory Visit: Payer: No Typology Code available for payment source | Attending: Physician Assistant | Admitting: Physician Assistant

## 2023-10-03 ENCOUNTER — Encounter: Payer: Self-pay | Admitting: Physician Assistant

## 2023-10-03 ENCOUNTER — Ambulatory Visit: Payer: No Typology Code available for payment source

## 2023-10-03 VITALS — BP 147/82 | HR 98 | Resp 15 | Ht 66.0 in | Wt 203.8 lb

## 2023-10-03 DIAGNOSIS — M7918 Myalgia, other site: Secondary | ICD-10-CM

## 2023-10-03 DIAGNOSIS — M79642 Pain in left hand: Secondary | ICD-10-CM | POA: Diagnosis not present

## 2023-10-03 DIAGNOSIS — M25561 Pain in right knee: Secondary | ICD-10-CM

## 2023-10-03 DIAGNOSIS — F4310 Post-traumatic stress disorder, unspecified: Secondary | ICD-10-CM

## 2023-10-03 DIAGNOSIS — K2100 Gastro-esophageal reflux disease with esophagitis, without bleeding: Secondary | ICD-10-CM

## 2023-10-03 DIAGNOSIS — E119 Type 2 diabetes mellitus without complications: Secondary | ICD-10-CM

## 2023-10-03 DIAGNOSIS — Q282 Arteriovenous malformation of cerebral vessels: Secondary | ICD-10-CM

## 2023-10-03 DIAGNOSIS — I10 Essential (primary) hypertension: Secondary | ICD-10-CM

## 2023-10-03 DIAGNOSIS — G932 Benign intracranial hypertension: Secondary | ICD-10-CM

## 2023-10-03 DIAGNOSIS — M79641 Pain in right hand: Secondary | ICD-10-CM

## 2023-10-03 DIAGNOSIS — Z8669 Personal history of other diseases of the nervous system and sense organs: Secondary | ICD-10-CM

## 2023-10-03 DIAGNOSIS — F900 Attention-deficit hyperactivity disorder, predominantly inattentive type: Secondary | ICD-10-CM

## 2023-10-03 DIAGNOSIS — R768 Other specified abnormal immunological findings in serum: Secondary | ICD-10-CM | POA: Diagnosis not present

## 2023-10-03 DIAGNOSIS — M5030 Other cervical disc degeneration, unspecified cervical region: Secondary | ICD-10-CM

## 2023-10-03 DIAGNOSIS — I7300 Raynaud's syndrome without gangrene: Secondary | ICD-10-CM | POA: Diagnosis not present

## 2023-10-03 DIAGNOSIS — M25562 Pain in left knee: Secondary | ICD-10-CM

## 2023-10-03 DIAGNOSIS — Z87891 Personal history of nicotine dependence: Secondary | ICD-10-CM

## 2023-10-03 DIAGNOSIS — M51369 Other intervertebral disc degeneration, lumbar region without mention of lumbar back pain or lower extremity pain: Secondary | ICD-10-CM

## 2023-10-03 DIAGNOSIS — F419 Anxiety disorder, unspecified: Secondary | ICD-10-CM

## 2023-10-03 DIAGNOSIS — F32A Depression, unspecified: Secondary | ICD-10-CM

## 2023-10-03 DIAGNOSIS — G8929 Other chronic pain: Secondary | ICD-10-CM

## 2023-10-03 DIAGNOSIS — R5383 Other fatigue: Secondary | ICD-10-CM

## 2023-10-03 DIAGNOSIS — R202 Paresthesia of skin: Secondary | ICD-10-CM

## 2023-10-03 DIAGNOSIS — Z8679 Personal history of other diseases of the circulatory system: Secondary | ICD-10-CM

## 2023-10-03 DIAGNOSIS — E559 Vitamin D deficiency, unspecified: Secondary | ICD-10-CM

## 2023-10-03 NOTE — Progress Notes (Signed)
X-ray findings are consistent with osteoarthritis.  Ultrasound scheduled tomorrow.

## 2023-10-03 NOTE — Patient Instructions (Signed)

## 2023-10-04 ENCOUNTER — Ambulatory Visit: Payer: No Typology Code available for payment source

## 2023-10-04 ENCOUNTER — Ambulatory Visit: Payer: No Typology Code available for payment source | Attending: Rheumatology | Admitting: Rheumatology

## 2023-10-04 DIAGNOSIS — M79641 Pain in right hand: Secondary | ICD-10-CM | POA: Diagnosis not present

## 2023-10-04 DIAGNOSIS — M79642 Pain in left hand: Secondary | ICD-10-CM

## 2023-10-04 NOTE — Progress Notes (Signed)
RF negative  CRP WNL

## 2023-10-04 NOTE — Progress Notes (Signed)
Chief complaint: Pain in both hands  Patient was here to get a limited ultrasound examination of bilateral hands to evaluate for synovitis.  Ultrasound examination of bilateral hands was performed per EULAR recommendations. Using 15 MHz transducer, grayscale and power Doppler bilateral second and third MCP joints both dorsal and volar aspects were evaluated to look for synovitis or tenosynovitis.  Synovitis was noted in the bilateral second MCP joints and left third MCP joint on the ultrasound examination..   Impression: Synovitis was noted in the bilateral second MCP joints and the left third MCP joint on the ultrasound examination.  Pollyann Savoy, MD

## 2023-10-04 NOTE — Progress Notes (Signed)
ESR WNL

## 2023-10-05 NOTE — Progress Notes (Signed)
 Office Visit Note  Patient: Virginia Douglas             Date of Birth: 11-01-1975           MRN: [id number]             PCP: Georgina Speaks, FNP Referring: Georgina Speaks, FNP Visit Date: 10/11/2023 Occupation: @GUAROCC @  Subjective:  Discuss treatment options   History of Present Illness: Virginia Douglas is a 48 y.o. female with history of seronegative rheumatoid arthritis and osteoarthritis. Patient presents today to discuss ultrasound results and treatment options.  Patient continues to have persistent pain, stiffness, and swelling in both hands.  She continues to have pain and stiffness in the cervical spine, both shoulders, as well as increased discomfort in both feet.  She remains under the care of pain management.   Patient states that she notices intermittent facial rashes especially if she is out in the sun.  Patient brought photographs of a rash overlying her eyelids.  According to the patient her PCP advised her to show us  the photographs.    Activities of Daily Living:  Patient reports morning stiffness for 2 hours.   Patient Reports nocturnal pain.  Difficulty dressing/grooming: Reports Difficulty climbing stairs: Reports Difficulty getting out of chair: Reports Difficulty using hands for taps, buttons, cutlery, and/or writing: Reports  Review of Systems  Constitutional:  Positive for fatigue.  HENT:  Positive for mouth sores and mouth dryness. Negative for nose dryness.   Eyes:  Positive for dryness. Negative for pain and visual disturbance.  Respiratory:  Negative for cough, hemoptysis, shortness of breath and difficulty breathing.   Cardiovascular:  Positive for palpitations. Negative for chest pain, hypertension and swelling in legs/feet.  Gastrointestinal:  Negative for blood in stool, constipation and diarrhea.  Endocrine: Positive for increased urination.  Genitourinary:  Negative for painful urination and involuntary urination.  Musculoskeletal:   Positive for joint pain, gait problem, joint pain, joint swelling, myalgias, muscle weakness, morning stiffness, muscle tenderness and myalgias.  Skin:  Positive for color change, hair loss and sensitivity to sunlight. Negative for pallor, rash, nodules/bumps, skin tightness and ulcers.  Allergic/Immunologic: Positive for susceptible to infections.  Neurological:  Positive for dizziness and headaches. Negative for numbness and weakness.  Hematological:  Negative for swollen glands.  Psychiatric/Behavioral:  Positive for sleep disturbance. Negative for depressed mood. The patient is nervous/anxious.     PMFS History:  Patient Active Problem List   Diagnosis Date Noted   HFrEF (heart failure with reduced ejection fraction) (HCC) 11/09/2022   Type 2 diabetes mellitus with obesity (HCC) 11/08/2022   DKA (diabetic ketoacidosis) (HCC) 10/13/2022   Cerebral venous thrombosis of sigmoid sinus 11/21/2021   Pulsatile tinnitus of right ear 11/21/2021   Pulsatile tinnitus, bilateral 06/27/2021   AVM (arteriovenous malformation) brain 05/31/2021   DDD (degenerative disc disease), cervical 12/06/2020   DDD (degenerative disc disease), lumbar 12/06/2020   Gastroesophageal reflux disease with esophagitis without hemorrhage 12/06/2020   Increased intracranial pressure 12/06/2020   PTSD (post-traumatic stress disorder) 12/06/2020   Attention deficit hyperactivity disorder (ADHD), predominantly inattentive type 12/06/2020   Vitamin D  deficiency 12/06/2020   Obesity (BMI 30-39.9) 03/15/2020   Sciatic nerve pain 12/10/2019   Grief 06/05/2019   Migraine 06/05/2019   Type 2 diabetes mellitus with obesity (HCC) 04/07/2019   Depression 04/07/2019   Obesity due to excess calories without serious comorbidity 04/07/2019   Anxiety 04/07/2019   Headache 07/06/2018   Diabetes (HCC) 12/08/2015  Benign hypertensive heart disease with congestive heart failure and with combined systolic and diastolic dysfunction  (HCC) 12/08/2015   Paresthesia 09/16/2015   Urinary urgency 09/16/2015    Past Medical History:  Diagnosis Date   Acute pain of left shoulder 06/05/2019   ADHD (attention deficit hyperactivity disorder)    Anemia    iron deficiency   as a teenager   Anxiety    Arthritis    Chronic combined systolic and diastolic congestive heart failure (HCC) 11/23/2022   This is a new diagnosis with Cardiology, she is scheduled on 2/14 for her MRI of her heart.     Diabetes (HCC)    Edema    GERD (gastroesophageal reflux disease)    History of congestive heart failure    dx by cardiology   Hypertension    patient denies- patient stated that the PCP said it was for heart prevention.   Left leg pain 04/07/2019   LLQ pain 12/08/2015   Migraine headache     Family History  Problem Relation Age of Onset   Diabetes Mother    Hypertension Mother    Diabetes Father    Hypertension Father    Kidney failure Father    Hypertension Brother    Cancer Maternal Grandmother    Diabetes Maternal Grandmother    Pancreatic cancer Maternal Grandmother    Diabetes Paternal Grandmother    Breast cancer Paternal Grandmother    Stroke Paternal Grandfather    Healthy Son    Healthy Son    Cancer Maternal Aunt        breast   Breast cancer Maternal Aunt    Past Surgical History:  Procedure Laterality Date   ABDOMINAL HYSTERECTOMY  2012   CHOLECYSTECTOMY     COLONOSCOPY  12/28/2020   polyps   ESOPHAGEAL DILATION     gamma knife     04/2022   IR ANGIO INTRA EXTRACRAN SEL COM CAROTID INNOMINATE BILAT MOD SED  05/31/2021   IR ANGIO INTRA EXTRACRAN SEL COM CAROTID INNOMINATE BILAT MOD SED  06/27/2021   IR ANGIO INTRA EXTRACRAN SEL COM CAROTID INNOMINATE UNI R MOD SED  11/21/2021   IR ANGIO VERTEBRAL SEL VERTEBRAL UNI R MOD SED  05/31/2021   IR CT HEAD LTD  06/27/2021   IR CT HEAD LTD  11/21/2021   IR INTRA CRAN STENT  06/27/2021   IR INTRA CRAN STENT  11/21/2021   IR RADIOLOGIST EVAL & MGMT  06/03/2021    IR RADIOLOGIST EVAL & MGMT  07/13/2021   IR RADIOLOGIST EVAL & MGMT  08/27/2021   IR RADIOLOGIST EVAL & MGMT  10/30/2021   IR RADIOLOGIST EVAL & MGMT  12/06/2021   IR US  GUIDE VASC ACCESS RIGHT  05/31/2021   IR US  GUIDE VASC ACCESS RIGHT  06/27/2021   IR US  GUIDE VASC ACCESS RIGHT  11/21/2021   OVARIAN CYST REMOVAL     RADIOLOGY WITH ANESTHESIA N/A 06/27/2021   Procedure: STENTING;  Surgeon: Dolphus Carrion, MD;  Location: [facility] OR;  Service: Radiology;  Laterality: N/A;   RADIOLOGY WITH ANESTHESIA N/A 11/21/2021   Procedure: IR WITH ANESTHESIA STENTING;  Surgeon: Dolphus Carrion, MD;  Location: [facility] OR;  Service: Radiology;  Laterality: N/A;   Social History   Social History Narrative   In relationship, midwife in set designer facility, does a lot of walking and standing on the job, walks for exercise   Caffeine  use: Drinks tea (3 glasses per week)      Patient is  right handed. She lives with her 2 children in a one story house. She drinks one large cup of coffee a day and an occasional tea or soda. She walks daily.      One story home      Immunization History  Administered Date(s) Administered   HPV 9-valent 04/11/2019   Influenza Inj Mdck Quad Pf 05/18/2018   Influenza,inj,Quad PF,6+ Mos 05/20/2017, 05/14/2019, 06/02/2020, 05/09/2022   Influenza-Unspecified 05/11/2017, 05/15/2018, 05/08/2019, 07/13/2021   Janssen (J&J) SARS-COV-2 Vaccination 11/23/2019   PFIZER(Purple Top)SARS-COV-2 Vaccination 07/11/2020   Pfizer(Comirnaty)Fall Seasonal Vaccine 12 years and older 01/15/2023   Pneumococcal Polysaccharide-23 04/08/2019, 07/15/2019   Tdap 07/02/2017, 04/09/2019     Objective: Vital Signs: BP 124/81 (BP Location: Left Arm, Patient Position: Sitting, Cuff Size: Normal)   Pulse 91   Resp 16   Ht 5' 6 (1.676 m)   Wt 197 lb (89.4 kg)   BMI 31.80 kg/m    Physical Exam Vitals and nursing note reviewed.  Constitutional:      Appearance: She is well-developed.   HENT:     Head: Normocephalic and atraumatic.  Eyes:     Conjunctiva/sclera: Conjunctivae normal.  Cardiovascular:     Rate and Rhythm: Normal rate and regular rhythm.     Heart sounds: Normal heart sounds.  Pulmonary:     Effort: Pulmonary effort is normal.     Breath sounds: Normal breath sounds.  Abdominal:     General: Bowel sounds are normal.     Palpations: Abdomen is soft.  Musculoskeletal:     Cervical back: Normal range of motion.  Lymphadenopathy:     Cervical: No cervical adenopathy.  Skin:    General: Skin is warm and dry.     Capillary Refill: Capillary refill takes less than 2 seconds.  Neurological:     Mental Status: She is alert and oriented to person, place, and time.  Psychiatric:        Behavior: Behavior normal.      Musculoskeletal Exam: C-spine limited ROM.  Discomfort and stiffness with ROM of both shoulders.  Elbow joints and wrist joints have good ROM.  Tenderness and synovitis of right 2nd MCP joint.  Tenderness across all MCP joints.  Hip joints have painful range of motion especially in the left hip.  Knee joints have good range of motion with no warmth or effusion.  Ankle joints have good range of motion with no tenderness or joint swelling.  CDAI Exam: CDAI Score: -- Patient Global: 90 / 100; Provider Global: 50 / 100 Swollen: --; Tender: -- Joint Exam 10/11/2023   No joint exam has been documented for this visit   There is currently no information documented on the homunculus. Go to the Rheumatology activity and complete the homunculus joint exam.  Investigation: No additional findings.  Imaging: XR Foot 2 Views Left Result Date: 10/11/2023 PIP and DIP narrowing was noted.  No MTP, intertarsal or tibiotalar joint space narrowing was noted.  Posterior calcaneal spur was noted.  Dorsal spurring was noted. Impression: These findings are suggestive of early osteoarthritis of the foot.  XR Foot 2 Views Right Result Date: 10/11/2023 PIP and DIP  narrowing was noted.  No MTP, intertarsal or tibiotalar joint space narrowing was noted.  Posterior calcaneal spur was noted.  Dorsal spurring was noted. Impression: These findings are suggestive of early osteoarthritis of the foot.  US  LIMITED JOINT SPACE STRUCTURES UP BILAT Result Date: 10/04/2023 Ultrasound examination of bilateral hands was performed per EULAR  recommendations. Using 15 MHz transducer, grayscale and power Doppler bilateral second and third MCP joints both dorsal and volar aspects were evaluated to look for synovitis or tenosynovitis.  Synovitis was noted in the bilateral second MCP joints and left third MCP joint on the ultrasound examination.. Impression: Synovitis was noted in the bilateral second MCP joints and the left third MCP joint on the ultrasound examination.  XR Hand 2 View Left Result Date: 10/03/2023 [facility], PIP and DIP narrowing was noted.  No MCP, intercarpal or radiocarpal joint space narrowing was noted.  No erosive changes were noted. Impression: These findings are suggestive of osteoarthritis of the hand.  XR Hand 2 View Right Result Date: 10/03/2023 [facility], PIP and DIP narrowing was noted.  No MCP, intercarpal or radiocarpal joint space narrowing was noted.  No erosive changes were noted. Impression: These findings are suggestive of osteoarthritis of the hand.  MR LUMBAR SPINE WO CONTRAST Result Date: 09/26/2023 CLINICAL DATA:  Chronic low back pain with bilateral leg pain and numbness. EXAM: MRI LUMBAR SPINE WITHOUT CONTRAST TECHNIQUE: Multiplanar, multisequence MR imaging of the lumbar spine was performed. No intravenous contrast was administered. COMPARISON:  MRI lumbar spine dated May 09, 2020. FINDINGS: Segmentation:  Standard. Alignment:  No listhesis. Vertebrae:  No fracture, evidence of discitis, or bone lesion. Conus medullaris and cauda equina: Conus extends to the L1 level. Conus and cauda equina appear normal. Paraspinal and other soft tissues: Negative.  Disc levels: T12-L1:  No significant disc bulge or herniation.  No stenosis. L1-L2:  No significant disc bulge or herniation.  No stenosis. L2-L3:  Mild disc bulging.  No stenosis. L3-L4:  Mild disc bulging.  No stenosis. L4-L5: Mild disc bulging and bilateral facet arthropathy. No stenosis. L5-S1: Mild disc bulging and bilateral facet arthropathy. No stenosis. IMPRESSION: 1. Slightly progressed mild multilevel lumbar spondylosis as described above. No stenosis or impingement. Electronically Signed   By: Elsie ONEIDA Shoulder M.D.   On: 09/26/2023 16:29    Recent Labs: Lab Results  Component Value Date   WBC 11.1 (H) 04/02/2023   HGB 13.1 04/02/2023   PLT 320 04/02/2023   NA 141 07/03/2023   K 4.2 07/03/2023   CL 108 (H) 07/03/2023   CO2 17 (L) 07/03/2023   GLUCOSE 84 07/03/2023   BUN 4 (L) 07/03/2023   CREATININE 0.74 07/03/2023   BILITOT 0.3 04/02/2023   ALKPHOS 144 (H) 01/15/2023   AST 17 04/02/2023   ALT 13 04/02/2023   PROT 7.3 04/02/2023   ALBUMIN 4.5 01/15/2023   CALCIUM  9.8 07/03/2023   GFRAA 89 07/15/2020    Speciality Comments: No specialty comments available.  Procedures:  No procedures performed Allergies: Patient has no known allergies.   Assessment / Plan:     Visit Diagnoses: Seronegative rheumatoid arthritis (HCC) - 10/03/23: RF-, Anti-CCP-, 14-3-3 eta-, ESR WNL, CRP WNL.  U/s 09/3023: positive for synovitis: Patient presents today to discuss the new diagnosis of seronegative rheumatoid arthritis.  She is experiencing persistent pain and intermittent inflammation involving both hands.  On examination today she has tenderness over MCP joints as well as mild inflammation in her right second MCP joint.  Different treatment options were discussed today in detail.  Plan to initiate a trial of Plaquenil  200 mg 1 tablet by mouth twice daily.  Indications, contraindications, potential side effects of Plaquenil  were reviewed today in detail.  All questions were addressed and consent  was obtained.  Patient was encouraged to schedule a baseline Plaquenil  eye examination so  a new referral to ophthalmology was placed today. CBC and CMP updated today--prescription for plaquenil  will be sent tomorrow.  She will notify us  if she develops any side effects.  She will follow-up in the office in 6 to 8 weeks to assess her response.  Patient was counseled on the purpose, proper use, and adverse effects of hydroxychloroquine  including nausea/diarrhea, skin rash, headaches, and sun sensitivity.  Advised patient to wear sunscreen once starting hydroxychloroquine  to reduce risk of rash associated with sun sensitivity.  Discussed importance of annual eye exams while on hydroxychloroquine  to monitor to ocular toxicity and discussed importance of frequent laboratory monitoring.  Provided patient with eye exam form for baseline ophthalmologic exam.  Provided patient with educational materials on hydroxychloroquine  and answered all questions.  Patient consented to hydroxychloroquine . Will upload consent in the media tab.    Reviewed risk for QTC prolongation when used in combination with other QTc prolonging agents (including but not limited to antiarrhythmics, macrolide antibiotics, flouroquinolone antibiotics, haloperidol, quetiapine, olanzapine, risperidone, droperidol, ziprasidone, amitriptyline, citalopram , ondansetron , migraine triptans, and methadone). EKG shows QTc wnl on 07/03/23: QT/QTcB 398/447.  Dose will be Plaquenil  200 mg twice daily.  Prescription pending lab results.  High risk medication use - Plaquenil  200 mg 1 tablet by mouth twice daily--pending lab results.  Orders for CBC and CMP released today.  Her next lab work will in due in 1 month, 3 months, then every 5 months.  Standing orders for CBC and CMP will be placed today.   Patient was given a plaquenil  eye examination form.   Plan: CBC with Differential/Platelet, COMPLETE METABOLIC PANEL WITH GFR, CBC with Differential/Platelet,  COMPLETE METABOLIC PANEL WITH GFR  Positive ANA (antinuclear antibody) - ANA 1:80 cytoplasmic, speckled on 03/27/22.  Repeat ANA on 07/12/22 negative.  Patient continues to have chronic fatigue, arthralgias, facial rashes, and photosensitivity.  The following lab work will be obtained today for further evaluation.- Plan: Anti-DNA antibody, double-stranded, C3 and C4, ANA, Protein / creatinine ratio, urine, RNP Antibody, Anti-Smith antibody, Sjogrens syndrome-A extractable nuclear antibody, Sjogrens syndrome-B extractable nuclear antibody, Anti-scleroderma antibody  Primary osteoarthritis of both hands: X-rays of both hands were consistent with osteoarthritis on 10/03/2023.  Ultrasound was positive for synovitis on 10/04/2023.  Raynaud's disease without gangrene - All autoimmune work-up negative.  Cryoglobulin not detected, beta-2  glycoproteins-, cardiolipin ab-, Scl-70 negative, LA not detected, ANCA and ANA negative  Myofascial pain: Patient continues to have generalized myalgias and muscle tenderness due to myofascial pain.  She remains under the care of pain management.   Chronic pain of both knees: No warmth or effusion noted.   DDD (degenerative disc disease), cervical: C-spine has limited ROM with lateral rotation.   Degeneration of intervertebral disc of lumbar region without discogenic back pain or lower extremity pain  Pain in both feet - She is experiencing increased pain and stiffness involving both feet.  X-rays of both feet were obtained today as a baseline.  Both x-rays were consistent with early osteoarthritic changes.  No erosive changes were noted.  Plan: XR Foot 2 Views Left, XR Foot 2 Views Right  Facial rash -She has been experiencing intermittent facial rashes and photosensitivity. No malar rash noted.  Plan to obtain the following lab work today.    Plan: Anti-DNA antibody, double-stranded, C3 and C4, ANA, Protein / creatinine ratio, urine, RNP Antibody, Anti-Smith antibody,  Sjogrens syndrome-A extractable nuclear antibody, Sjogrens syndrome-B extractable nuclear antibody, Anti-scleroderma antibody  Photosensitivity - Chronic photosensitivity.  Plan to obtain the  following lab work today.  plan: Anti-DNA antibody, double-stranded, C3 and C4, ANA, Protein / creatinine ratio, urine, RNP Antibody, Anti-Smith antibody, Sjogrens syndrome-A extractable nuclear antibody, Sjogrens syndrome-B extractable nuclear antibody, Anti-scleroderma antibody  Other medical conditions are listed as follows:   Essential hypertension: BP was 124/81 today in the office.   Type 2 diabetes mellitus without complication, without long-term current use of insulin  (HCC)  Increased intracranial pressure  AVM (arteriovenous malformation) brain  Hx of migraine headaches  Paresthesia  Attention deficit hyperactivity disorder (ADHD), predominantly inattentive type  Gastroesophageal reflux disease with esophagitis without hemorrhage  PTSD (post-traumatic stress disorder)  Anxiety and depression  Former smoker  History of chronic CHF  Vitamin D  deficiency  Other fatigue  Cerebral venous thrombosis of sigmoid sinus    Orders: Orders Placed This Encounter  Procedures   XR Foot 2 Views Left   XR Foot 2 Views Right   CBC with Differential/Platelet   COMPLETE METABOLIC PANEL WITH GFR   CBC with Differential/Platelet   COMPLETE METABOLIC PANEL WITH GFR   Anti-DNA antibody, double-stranded   C3 and C4   ANA   Protein / creatinine ratio, urine   RNP Antibody   Anti-Smith antibody   Sjogrens syndrome-A extractable nuclear antibody   Sjogrens syndrome-B extractable nuclear antibody   Anti-scleroderma antibody   No orders of the defined types were placed in this encounter.     Follow-Up Instructions: Return in about 6 weeks (around 11/22/2023) for Rheumatoid arthritis.   Waddell CHRISTELLA Craze, PA-C  Note - This record has been created using Dragon software.  Chart creation  errors have been sought, but may not always  have been located. Such creation errors do not reflect on  the standard of medical care.

## 2023-10-08 NOTE — Progress Notes (Signed)
 Anti-CCP negative.

## 2023-10-11 ENCOUNTER — Ambulatory Visit: Payer: No Typology Code available for payment source | Attending: Physician Assistant | Admitting: Physician Assistant

## 2023-10-11 ENCOUNTER — Ambulatory Visit: Payer: No Typology Code available for payment source

## 2023-10-11 ENCOUNTER — Encounter: Payer: Self-pay | Admitting: Physician Assistant

## 2023-10-11 VITALS — BP 124/81 | HR 91 | Resp 16 | Ht 66.0 in | Wt 197.0 lb

## 2023-10-11 DIAGNOSIS — M0600 Rheumatoid arthritis without rheumatoid factor, unspecified site: Secondary | ICD-10-CM | POA: Diagnosis not present

## 2023-10-11 DIAGNOSIS — M79671 Pain in right foot: Secondary | ICD-10-CM

## 2023-10-11 DIAGNOSIS — M79672 Pain in left foot: Secondary | ICD-10-CM

## 2023-10-11 DIAGNOSIS — E119 Type 2 diabetes mellitus without complications: Secondary | ICD-10-CM

## 2023-10-11 DIAGNOSIS — M51369 Other intervertebral disc degeneration, lumbar region without mention of lumbar back pain or lower extremity pain: Secondary | ICD-10-CM

## 2023-10-11 DIAGNOSIS — G08 Intracranial and intraspinal phlebitis and thrombophlebitis: Secondary | ICD-10-CM

## 2023-10-11 DIAGNOSIS — G932 Benign intracranial hypertension: Secondary | ICD-10-CM

## 2023-10-11 DIAGNOSIS — Z8669 Personal history of other diseases of the nervous system and sense organs: Secondary | ICD-10-CM

## 2023-10-11 DIAGNOSIS — R202 Paresthesia of skin: Secondary | ICD-10-CM

## 2023-10-11 DIAGNOSIS — E559 Vitamin D deficiency, unspecified: Secondary | ICD-10-CM

## 2023-10-11 DIAGNOSIS — Q282 Arteriovenous malformation of cerebral vessels: Secondary | ICD-10-CM

## 2023-10-11 DIAGNOSIS — I10 Essential (primary) hypertension: Secondary | ICD-10-CM

## 2023-10-11 DIAGNOSIS — M5030 Other cervical disc degeneration, unspecified cervical region: Secondary | ICD-10-CM

## 2023-10-11 DIAGNOSIS — Z79899 Other long term (current) drug therapy: Secondary | ICD-10-CM

## 2023-10-11 DIAGNOSIS — F900 Attention-deficit hyperactivity disorder, predominantly inattentive type: Secondary | ICD-10-CM

## 2023-10-11 DIAGNOSIS — R768 Other specified abnormal immunological findings in serum: Secondary | ICD-10-CM | POA: Diagnosis not present

## 2023-10-11 DIAGNOSIS — M19042 Primary osteoarthritis, left hand: Secondary | ICD-10-CM

## 2023-10-11 DIAGNOSIS — K2100 Gastro-esophageal reflux disease with esophagitis, without bleeding: Secondary | ICD-10-CM

## 2023-10-11 DIAGNOSIS — F32A Depression, unspecified: Secondary | ICD-10-CM

## 2023-10-11 DIAGNOSIS — M7918 Myalgia, other site: Secondary | ICD-10-CM

## 2023-10-11 DIAGNOSIS — F4310 Post-traumatic stress disorder, unspecified: Secondary | ICD-10-CM

## 2023-10-11 DIAGNOSIS — L568 Other specified acute skin changes due to ultraviolet radiation: Secondary | ICD-10-CM

## 2023-10-11 DIAGNOSIS — M19041 Primary osteoarthritis, right hand: Secondary | ICD-10-CM

## 2023-10-11 DIAGNOSIS — F419 Anxiety disorder, unspecified: Secondary | ICD-10-CM

## 2023-10-11 DIAGNOSIS — M25561 Pain in right knee: Secondary | ICD-10-CM

## 2023-10-11 DIAGNOSIS — I7300 Raynaud's syndrome without gangrene: Secondary | ICD-10-CM

## 2023-10-11 DIAGNOSIS — R21 Rash and other nonspecific skin eruption: Secondary | ICD-10-CM

## 2023-10-11 DIAGNOSIS — R5383 Other fatigue: Secondary | ICD-10-CM

## 2023-10-11 DIAGNOSIS — G8929 Other chronic pain: Secondary | ICD-10-CM

## 2023-10-11 DIAGNOSIS — M25562 Pain in left knee: Secondary | ICD-10-CM

## 2023-10-11 DIAGNOSIS — Z87891 Personal history of nicotine dependence: Secondary | ICD-10-CM

## 2023-10-11 DIAGNOSIS — Z8679 Personal history of other diseases of the circulatory system: Secondary | ICD-10-CM

## 2023-10-11 LAB — CYCLIC CITRUL PEPTIDE ANTIBODY, IGG: Cyclic Citrullin Peptide Ab: <16 U

## 2023-10-11 LAB — RHEUMATOID FACTOR: Rheumatoid fact SerPl-aCnc: <10 [IU]/mL (ref <14)

## 2023-10-11 LAB — SEDIMENTATION RATE: Sed Rate: 9 mm/h (ref 0–20)

## 2023-10-11 LAB — 14-3-3 ETA PROTEIN: 14-3-3 eta Protein: <0.2 ng/mL (ref <0.2)

## 2023-10-11 LAB — C-REACTIVE PROTEIN: CRP: 6.7 mg/L

## 2023-10-11 NOTE — Progress Notes (Signed)
 Pharmacy Note  Subjective: Patient presents today to [facility] for follow up office visit.   Patient seen by the pharmacist for counseling on hydroxychloroquine  forrheumatoid arthritis.    Objective: CMP     Component Value Date/Time   NA 141 07/03/2023 0853   K 4.2 07/03/2023 0853   CL 108 (H) 07/03/2023 0853   CO2 17 (L) 07/03/2023 0853   GLUCOSE 84 07/03/2023 0853   GLUCOSE 93 04/02/2023 1544   BUN 4 (L) 07/03/2023 0853   CREATININE 0.74 07/03/2023 0853   CREATININE 0.64 04/02/2023 1544   CALCIUM  9.8 07/03/2023 0853   PROT 7.3 04/02/2023 1544   PROT 7.1 01/15/2023 1705   ALBUMIN 4.5 01/15/2023 1705   AST 17 04/02/2023 1544   ALT 13 04/02/2023 1544   ALKPHOS 144 (H) 01/15/2023 1705   BILITOT 0.3 04/02/2023 1544   BILITOT <0.2 01/15/2023 1705   GFRNONAA >60 10/15/2022 0046   GFRAA 89 07/15/2020 1052    CBC    Component Value Date/Time   WBC 11.1 (H) 04/02/2023 1544   RBC 4.67 04/02/2023 1544   HGB 13.1 04/02/2023 1544   HGB 13.5 10/12/2022 0905   HCT 38.3 04/02/2023 1544   HCT 40.7 10/12/2022 0905   PLT 320 04/02/2023 1544   PLT 321 10/12/2022 0905   MCV 82.0 04/02/2023 1544   MCV 85 10/12/2022 0905   MCH 28.1 04/02/2023 1544   MCHC 34.2 04/02/2023 1544   RDW 15.3 (H) 04/02/2023 1544   RDW 13.9 10/12/2022 0905   LYMPHSABS 4,040 (H) 04/02/2023 1544   MONOABS 0.6 10/13/2022 1019   EOSABS 222 04/02/2023 1544   BASOSABS 89 04/02/2023 1544   Assessment/Plan: Patient was counseled on the purpose, proper use, and adverse effects of hydroxychloroquine  including nausea/diarrhea, skin rash, headaches, and sun sensitivity.  Advised patient to wear sunscreen once starting hydroxychloroquine  to reduce risk of rash associated with sun sensitivity.  Discussed importance of annual eye exams while on hydroxychloroquine  to monitor to ocular toxicity and discussed importance of frequent laboratory monitoring.  Provided patient with eye exam form for baseline  ophthalmologic exam.  Provided patient with educational materials on hydroxychloroquine  and answered all questions.  Patient consented to hydroxychloroquine . Will upload consent in the media tab.    Reviewed risk for QTC prolongation when used in combination with other QTc prolonging agents (including but not limited to antiarrhythmics, macrolide antibiotics, flouroquinolone antibiotics, haloperidol, quetiapine, olanzapine, risperidone, droperidol, ziprasidone, amitriptyline, citalopram , ondansetron , migraine triptans, and methadone).   Dose will be Plaquenil  200 mg twice daily.  Prescription pending lab results. Referral to new ophthalmologist placed today (previously seeing [facility] but states they do not take her insurance)  Sherry Pennant, PharmD, MPH, BCPS, CPP Clinical Pharmacist (Rheumatology and Pulmonology)

## 2023-10-11 NOTE — Patient Instructions (Addendum)
Standing Labs We placed an order today for your standing lab work.   Please have your standing labs drawn in 1 month, 3months, then every 5 months   Please have your labs drawn 2 weeks prior to your appointment so that the provider can discuss your lab results at your appointment, if possible.  Please note that you may see your imaging and lab results in MyChart before we have reviewed them. We will contact you once all results are reviewed. Please allow our office up to 72 hours to thoroughly review all of the results before contacting the office for clarification of your results.  WALK-IN LAB HOURS  Monday through Thursday from 8:00 am -12:30 pm and 1:00 pm-5:00 pm and Friday from 8:00 am-12:00 pm.  Patients with office visits requiring labs will be seen before walk-in labs.  You may encounter longer than normal wait times. Please allow additional time. Wait times may be shorter on  Monday and Thursday afternoons.  We do not book appointments for walk-in labs. We appreciate your patience and understanding with our staff.   Labs are drawn by [facility]. Please bring your co-pay at the time of your lab draw.  You may receive a bill from [facility] for your lab work.  Please note if you are on Hydroxychloroquine and and an order has been placed for a Hydroxychloroquine level,  you will need to have it drawn 4 hours or more after your last dose.  If you wish to have your labs drawn at another location, please call the office 24 hours in advance so we can fax the orders.  The office is located at [address], [place], [place] [address]   If you have any questions regarding directions or hours of operation,  please call [phone].   As a reminder, please drink plenty of water prior to coming for your lab work. Thanks!   Hydroxychloroquine Tablets What is this medication? HYDROXYCHLOROQUINE (hye drox ee KLOR oh kwin) treats autoimmune conditions, such as rheumatoid arthritis and  lupus. It works by slowing down an overactive immune system. It may also be used to prevent and treat malaria. It works by killing the parasite that causes malaria. It belongs to a group of medications called DMARDs. This medicine may be used for other purposes; ask your health care provider or pharmacist if you have questions. COMMON BRAND NAME(S): Plaquenil, Quineprox, SOVUNA What should I tell my care team before I take this medication? They need to know if you have any of these conditions: Diabetes Eye disease, vision problems Frequently drink alcohol G6PD deficiency Heart disease Irregular heartbeat or rhythm Kidney disease Liver disease Porphyria Psoriasis An unusual or allergic reaction to hydroxychloroquine, other medications, foods, dyes, or preservatives Pregnant or trying to get pregnant Breastfeeding How should I use this medication? Take this medication by mouth with water. Take it as directed on the prescription label. Do not cut, crush, or chew this medication. Swallow the tablets whole. Take it with food. Do not take it more than directed. Take all of this medication unless your care team tells you to stop it early. Keep taking it even if you think you are better. Take products with antacids in them at a different time of day than this medication. Take this medication 4 hours before or 4 hours after antacids. Talk to your care team if you have questions. Talk to your care team about the use of this medication in children. While this medication may be prescribed  for selected conditions, precautions do apply. Overdosage: If you think you have taken too much of this medicine contact a poison control center or emergency room at once. NOTE: This medicine is only for you. Do not share this medicine with others. What if I miss a dose? If you miss a dose, take it as soon as you can. If it is almost time for your next dose, take only that dose. Do not take double or extra doses. What  may interact with this medication? Do not take this medication with any of the following: Cisapride Dronedarone Pimozide Thioridazine This medication may also interact with the following: Ampicillin Antacids Cimetidine Cyclosporine Digoxin Kaolin Medications for diabetes, such as insulin, glipizide, glyburide Medications for seizures, such as carbamazepine, phenobarbital, phenytoin Mefloquine Methotrexate Other medications that cause heart rhythm changes Praziquantel This list may not describe all possible interactions. Give your health care provider a list of all the medicines, herbs, non-prescription drugs, or dietary supplements you use. Also tell them if you smoke, drink alcohol, or use illegal drugs. Some items may interact with your medicine. What should I watch for while using this medication? Visit your care team for regular checks on your progress. Tell your care team if your symptoms do not start to get better or if they get worse. You may need blood work done while you are taking this medication. If you take other medications that can affect heart rhythm, you may need more testing. Talk to your care team if you have questions. Your vision may be tested before and during use of this medication. Tell your care team right away if you have any change in your eyesight. This medication may cause serious skin reactions. They can happen weeks to months after starting the medication. Contact your care team right away if you notice fevers or flu-like symptoms with a rash. The rash may be red or purple and then turn into blisters or peeling of the skin. Or, you might notice a red rash with swelling of the face, lips or lymph nodes in your neck or under your arms. If you or your family notice any changes in your behavior, such as new or worsening depression, thoughts of harming yourself, anxiety, or other unusual or disturbing thoughts, or memory loss, call your care team right away. What  side effects may I notice from receiving this medication? Side effects that you should report to your care team as soon as possible: Allergic reactions--skin rash, itching, hives, swelling of the face, lips, tongue, or throat Aplastic anemia--unusual weakness or fatigue, dizziness, headache, trouble breathing, increased bleeding or bruising Change in vision Heart rhythm changes--fast or irregular heartbeat, dizziness, feeling faint or lightheaded, chest pain, trouble breathing Infection--fever, chills, cough, or sore throat Low blood sugar (hypoglycemia)--tremors or shaking, anxiety, sweating, cold or clammy skin, confusion, dizziness, rapid heartbeat Muscle injury--unusual weakness or fatigue, muscle pain, dark yellow or brown urine, decrease in amount of urine Pain, tingling, or numbness in the hands or feet Rash, fever, and swollen lymph nodes Redness, blistering, peeling, or loosening of the skin, including inside the mouth Thoughts of suicide or self-harm, worsening mood, or feelings of depression Unusual bruising or bleeding Side effects that usually do not require medical attention (report to your care team if they continue or are bothersome): Diarrhea Headache Nausea Stomach pain Vomiting This list may not describe all possible side effects. Call your doctor for medical advice about side effects. You may report side effects to FDA at 1-800-FDA-1088. Where  should I keep my medication? Keep out of the reach of children and pets. Store at room temperature up to 30 degrees C (86 degrees F). Protect from light. Get rid of any unused medication after the expiration date. To get rid of medications that are no longer needed or have expired: Take the medication to a medication take-back program. Check with your pharmacy or law enforcement to find a location. If you cannot return the medication, check the label or package insert to see if the medication should be thrown out in the garbage or  flushed down the toilet. If you are not sure, ask your care team. If it is safe to put it in the trash, empty the medication out of the container. Mix the medication with cat litter, dirt, coffee grounds, or other unwanted substance. Seal the mixture in a bag or container. Put it in the trash. NOTE: This sheet is a summary. It may not cover all possible information. If you have questions about this medicine, talk to your doctor, pharmacist, or health care provider.  2024 [employer] Standard (2022-02-27 00:00:00)

## 2023-10-11 NOTE — Progress Notes (Signed)
 X-rays of both feet consistent with early OA

## 2023-10-12 NOTE — Progress Notes (Signed)
 CBC WNL  Urine protein creatinine ratio WNL  Calcium  is elevated-10.3--please clarify if she has been taking a calcium  or vitamin D  supplement?  Alk phos is slightly elevated-we will continue to monitor.  Patient will need to update CBC and CMP in 1 month after starting plaquenil 

## 2023-10-13 LAB — CBC WITH DIFFERENTIAL/PLATELET
Absolute Lymphocytes: 3254 {cells}/uL (ref 850–3900)
Absolute Monocytes: 519 {cells}/uL (ref 200–950)
Basophils Absolute: 78 {cells}/uL (ref 0–200)
Basophils Relative: 0.8 %
Eosinophils Absolute: 137 {cells}/uL (ref 15–500)
Eosinophils Relative: 1.4 %
HCT: 42.7 % (ref 35.0–45.0)
Hemoglobin: 13.9 g/dL (ref 11.7–15.5)
MCH: 27.9 pg (ref 27.0–33.0)
MCHC: 32.6 g/dL (ref 32.0–36.0)
MCV: 85.6 fL (ref 80.0–100.0)
MPV: 10.6 fL (ref 7.5–12.5)
Monocytes Relative: 5.3 %
Neutro Abs: 5811 {cells}/uL (ref 1500–7800)
Neutrophils Relative %: 59.3 %
Platelets: 344 10*3/uL (ref 140–400)
RBC: 4.99 10*6/uL (ref 3.80–5.10)
RDW: 14.2 % (ref 11.0–15.0)
Total Lymphocyte: 33.2 %
WBC: 9.8 10*3/uL (ref 3.8–10.8)

## 2023-10-13 LAB — COMPLETE METABOLIC PANEL WITH GFR
AG Ratio: 1.6 (calc) (ref 1.0–2.5)
ALT: 13 U/L (ref 6–29)
AST: 19 U/L (ref 10–35)
Albumin: 4.9 g/dL (ref 3.6–5.1)
Alkaline phosphatase (APISO): 138 U/L — ABNORMAL HIGH (ref 31–125)
BUN: 8 mg/dL (ref 7–25)
CO2: 17 mmol/L — ABNORMAL LOW (ref 20–32)
Calcium: 10.3 mg/dL — ABNORMAL HIGH (ref 8.6–10.2)
Chloride: 108 mmol/L (ref 98–110)
Creat: 0.74 mg/dL (ref 0.50–0.99)
Globulin: 3.1 g/dL (ref 1.9–3.7)
Glucose, Bld: 79 mg/dL (ref 65–99)
Potassium: 4.1 mmol/L (ref 3.5–5.3)
Sodium: 139 mmol/L (ref 135–146)
Total Bilirubin: 0.3 mg/dL (ref 0.2–1.2)
Total Protein: 8 g/dL (ref 6.1–8.1)
eGFR: 101 mL/min/{1.73_m2} (ref >=60)

## 2023-10-13 LAB — ANTI-NUCLEAR AB-TITER (ANA TITER): ANA Titer 1: 1:80 {titer} — ABNORMAL HIGH

## 2023-10-13 LAB — ANTI-SMITH ANTIBODY: ENA SM Ab Ser-aCnc: <1 AI

## 2023-10-13 LAB — PROTEIN / CREATININE RATIO, URINE
Creatinine, Urine: 189 mg/dL (ref 20–275)
Protein/Creat Ratio: 85 mg/g{creat} (ref 24–184)
Protein/Creatinine Ratio: 0.085 mg/mg{creat} (ref 0.024–0.184)
Total Protein, Urine: 16 mg/dL (ref 5–24)

## 2023-10-13 LAB — SJOGRENS SYNDROME-B EXTRACTABLE NUCLEAR ANTIBODY: SSB (La) (ENA) Antibody, IgG: <1 AI

## 2023-10-13 LAB — RNP ANTIBODY: Ribonucleic Protein(ENA) Antibody, IgG: <1 AI

## 2023-10-13 LAB — C3 AND C4
C3 Complement: 201 mg/dL — ABNORMAL HIGH (ref 83–193)
C4 Complement: 29 mg/dL (ref 15–57)

## 2023-10-13 LAB — SJOGRENS SYNDROME-A EXTRACTABLE NUCLEAR ANTIBODY: SSA (Ro) (ENA) Antibody, IgG: <1 AI

## 2023-10-13 LAB — ANTI-SCLERODERMA ANTIBODY: Scleroderma (Scl-70) (ENA) Antibody, IgG: <1 AI

## 2023-10-13 LAB — ANA: Anti Nuclear Antibody (ANA): POSITIVE — AB

## 2023-10-13 LAB — ANTI-DNA ANTIBODY, DOUBLE-STRANDED: ds DNA Ab: <1 [IU]/mL

## 2023-10-15 NOTE — Progress Notes (Signed)
 RO and La negative  dsDNA is negative  C3 elevated, C4 WNL.   RNP negative  Smith negative.  ANA remains positive.   Not consistent with active lupus at this time.

## 2023-10-16 MED ORDER — HYDROXYCHLOROQUINE SULFATE 200 MG PO TABS
200.0000 mg | ORAL_TABLET | Freq: Two times a day (BID) | ORAL | 2 refills | Status: DC
Start: 2023-10-16 — End: 2024-02-05

## 2023-10-16 NOTE — Telephone Encounter (Signed)
Per office note on 10/11/2023: Dose will be Plaquenil 200 mg twice daily

## 2023-10-18 ENCOUNTER — Ambulatory Visit: Payer: No Typology Code available for payment source | Admitting: Nurse Practitioner

## 2023-10-18 VITALS — BP 120/70 | HR 97 | Temp 98.5°F | Ht 66.0 in | Wt 201.0 lb

## 2023-10-18 DIAGNOSIS — Z6832 Body mass index (BMI) 32.0-32.9, adult: Secondary | ICD-10-CM

## 2023-10-18 DIAGNOSIS — Z2821 Immunization not carried out because of patient refusal: Secondary | ICD-10-CM

## 2023-10-18 DIAGNOSIS — F3341 Major depressive disorder, recurrent, in partial remission: Secondary | ICD-10-CM

## 2023-10-18 DIAGNOSIS — I110 Hypertensive heart disease with heart failure: Secondary | ICD-10-CM

## 2023-10-18 DIAGNOSIS — I5040 Unspecified combined systolic (congestive) and diastolic (congestive) heart failure: Secondary | ICD-10-CM | POA: Diagnosis not present

## 2023-10-18 DIAGNOSIS — E119 Type 2 diabetes mellitus without complications: Secondary | ICD-10-CM

## 2023-10-18 DIAGNOSIS — E66811 Obesity, class 1: Secondary | ICD-10-CM

## 2023-10-18 DIAGNOSIS — W19XXXA Unspecified fall, initial encounter: Secondary | ICD-10-CM | POA: Insufficient documentation

## 2023-10-18 DIAGNOSIS — Z0000 Encounter for general adult medical examination without abnormal findings: Secondary | ICD-10-CM | POA: Diagnosis not present

## 2023-10-18 DIAGNOSIS — Z23 Encounter for immunization: Secondary | ICD-10-CM | POA: Diagnosis not present

## 2023-10-18 DIAGNOSIS — F419 Anxiety disorder, unspecified: Secondary | ICD-10-CM | POA: Diagnosis not present

## 2023-10-18 DIAGNOSIS — E1169 Type 2 diabetes mellitus with other specified complication: Secondary | ICD-10-CM

## 2023-10-18 DIAGNOSIS — Z79899 Other long term (current) drug therapy: Secondary | ICD-10-CM

## 2023-10-18 DIAGNOSIS — E6609 Other obesity due to excess calories: Secondary | ICD-10-CM

## 2023-10-18 DIAGNOSIS — M25572 Pain in left ankle and joints of left foot: Secondary | ICD-10-CM

## 2023-10-18 LAB — POCT URINALYSIS DIP (CLINITEK)
Bilirubin, UA: NEGATIVE
Blood, UA: NEGATIVE
Glucose, UA: NEGATIVE mg/dL
Ketones, POC UA: NEGATIVE mg/dL
Nitrite, UA: NEGATIVE
POC PROTEIN,UA: 30 — AB
Spec Grav, UA: >=1.03 — AB (ref 1.010–1.025)
Urobilinogen, UA: 1 U/dL
pH, UA: 6 (ref 5.0–8.0)

## 2023-10-18 MED ORDER — TIRZEPATIDE 10 MG/0.5ML ~~LOC~~ SOAJ: 10.0000 mg | SUBCUTANEOUS | 1 refills | Status: DC

## 2023-10-18 MED ORDER — BUPROPION HCL ER (XL) 150 MG PO TB24: 150.0000 mg | ORAL_TABLET | Freq: Every day | ORAL | 1 refills | Status: DC

## 2023-10-18 MED ORDER — CLONAZEPAM 0.5 MG PO TABS: 0.5000 mg | ORAL_TABLET | Freq: Every day | ORAL | 2 refills | Status: DC | PRN

## 2023-10-18 NOTE — Progress Notes (Signed)
 Madelaine Bhat, CMA,acting as a Neurosurgeon for Arnette Felts, FNP.,have documented all relevant documentation on the behalf of Arnette Felts, FNP,as directed by  Arnette Felts, FNP while in the presence of Arnette Felts, FNP.  Subjective:    Patient ID: Virginia Douglas , female    DOB: 06/22/1976 , 48 y.o.   MRN: [id number]  Chief Complaint  Patient presents with   Annual Exam    HPI  Patient presents today for HM, Patient reports compliance with medication. Patient denies any chest pain, SOB, or headaches. Patient reports she had a fall on her way here, patient reports she slipped on some mud.   She has been diagnosed with Rheumatoid Arthitis - Rheumatologist - she is started plaquenil a few days ago. She has now moved in with her son.   She is to return to [employer] next month - no changes in the AVM She continues with pain management but her pain medications are not working. They are wanting her to go to physical therapy. She needs something that she can go to before or after work.      Past Medical History:  Diagnosis Date   Acute pain of left shoulder 06/05/2019   ADHD (attention deficit hyperactivity disorder)    Anemia    iron deficiency   as a teenager   Anxiety    Arthritis    Chronic combined systolic and diastolic congestive heart failure (HCC) 11/23/2022   This is a new diagnosis with Cardiology, she is scheduled on 2/14 for her MRI of her heart.     Depression    Diabetes (HCC)    Edema    GERD (gastroesophageal reflux disease)    History of congestive heart failure    dx by cardiology   Hypertension    patient denies- patient stated that the PCP said it was for heart prevention.   Left leg pain 04/07/2019   LLQ pain 12/08/2015   Migraine headache    Urinary urgency 09/16/2015     Family History  Problem Relation Age of Onset   Diabetes Mother    Hypertension Mother    Diabetes Father    Hypertension Father    Kidney failure Father    Anxiety disorder Father     Hypertension Brother    Cancer Maternal Grandmother    Diabetes Maternal Grandmother    Pancreatic cancer Maternal Grandmother    Arthritis Maternal Grandmother    Diabetes Paternal Grandmother    Breast cancer Paternal Grandmother    Stroke Paternal Grandfather    Healthy Son    ADD / ADHD Son    Healthy Son    Asthma Son    Cancer Maternal Aunt        breast   Breast cancer Maternal Aunt    Birth defects Maternal Aunt      Current Outpatient Medications:    acetaZOLAMIDE (DIAMOX) 250 MG tablet, Take 1 tablet (250 mg total) by mouth 2 (two) times daily., Disp: 180 tablet, Rfl: 2   ADDERALL XR 30 MG 24 hr capsule, Take 30 mg by mouth daily., Disp: , Rfl:    albuterol (VENTOLIN HFA) 108 (90 Base) MCG/ACT inhaler, Inhale 2 puffs into the lungs as needed., Disp: , Rfl:    aspirin EC 81 MG tablet, Take 81 mg by mouth daily. Swallow whole., Disp: , Rfl:    atorvastatin (LIPITOR) 10 MG tablet, TAKE 1 TABLET BY MOUTH EVERY DAY, Disp: 90 tablet, Rfl: 1   Blood  Pressure Monitoring Soln KIT, , Disp: , Rfl:    carvedilol (COREG) 3.125 MG tablet, Take 1 tablet (3.125 mg total) by mouth 2 (two) times daily., Disp: 180 tablet, Rfl: 3   cetirizine (ZYRTEC) 10 MG tablet, Take 10 mg by mouth daily as needed for allergies., Disp: , Rfl:    clopidogrel (PLAVIX) 75 MG tablet, Take 1 tablet (75 mg total) by mouth daily., Disp: 90 tablet, Rfl: 3   DULoxetine (CYMBALTA) 60 MG capsule, TAKE 1 CAPSULE BY MOUTH EVERY DAY, Disp: 90 capsule, Rfl: 1   empagliflozin (JARDIANCE) 10 MG TABS tablet, Take 1 tablet by mouth once daily in the morning, Disp: 30 tablet, Rfl: 5   eszopiclone (LUNESTA) 2 MG TABS tablet, Take 1 tablet (2 mg total) by mouth at bedtime. Take immediately before bedtime, Disp: 30 tablet, Rfl: 5   famotidine (PEPCID) 20 MG tablet, Take 20 mg by mouth daily., Disp: , Rfl:    Galcanezumab-gnlm (EMGALITY) 120 MG/ML SOAJ, Inject 120 mg into the skin every 30 (thirty) days., Disp: 1.12 mL, Rfl:  11   guanFACINE (INTUNIV) 1 MG TB24 ER tablet, Take 1 mg by mouth daily., Disp: , Rfl:    hydroxychloroquine (PLAQUENIL) 200 MG tablet, Take 1 tablet (200 mg total) by mouth 2 (two) times daily., Disp: 60 tablet, Rfl: 2   insulin aspart (FIASP FLEXTOUCH) 100 UNIT/ML FlexTouch Pen, INJECT [facility] INSULIN BEFORE MEALS AND AT HS IF BS < 150 - 0 UNITS, 150-199 - 3 UNITS, 200-249 - 6 UNITS, 250-299 - 9 UNITS, 300-349 - 12 UNITS, >350 - 15 UNITS. IF >400 CALL OUR OFFICE WITH YOUR BLOOD SUGAR READINGS., Disp: 15 mL, Rfl: 1   Insulin Pen Needle (PEN NEEDLES) 32G X 4 MM MISC, 1 each by Does not apply route once a week., Disp: 30 each, Rfl: 3   Lancets (ONETOUCH DELICA PLUS LANCET33G) MISC, Apply 1 each topically 3 (three) times daily., Disp: , Rfl:    LINZESS 72 MCG capsule, TAKE 1 CAPSULE BY MOUTH DAILY BEFORE BREAKFAST., Disp: 30 capsule, Rfl: 5   phentermine (ADIPEX-P) 37.5 MG tablet, Take 1 tablet (37.5 mg total) by mouth daily before breakfast., Disp: 30 tablet, Rfl: 1   Polyethyl Glycol-Propyl Glycol (SYSTANE) 0.4-0.3 % SOLN, Place 1 drop into both eyes daily as needed (dry eyes)., Disp: , Rfl:    sacubitril-valsartan (ENTRESTO) 24-26 MG, Take 1 tablet by mouth 2 (two) times daily., Disp: 180 tablet, Rfl: 3   spironolactone (ALDACTONE) 25 MG tablet, Take 0.5 tablets (12.5 mg total) by mouth daily., Disp: 45 tablet, Rfl: 3   tirzepatide (MOUNJARO) 10 MG/0.5ML Pen, Inject 10 mg into the skin once a week., Disp: 6 mL, Rfl: 1   tiZANidine (ZANAFLEX) 4 MG tablet, TAKE 1 TABLET (4 MG TOTAL) BY MOUTH EVERY 8 (EIGHT) HOURS AS NEEDED FOR MUSCLE SPASMS, Disp: 270 tablet, Rfl: 1   topiramate (TOPAMAX) 50 MG tablet, TAKE 2 AND 1/2 TABLETS (125 MG TOTAL) BY MOUTH AT BEDTIME. Strength: 50 mg, Disp: 225 tablet, Rfl: 2   buPROPion (WELLBUTRIN XL) 150 MG 24 hr tablet, Take 1 tablet (150 mg total) by mouth daily., Disp: 90 tablet, Rfl: 1   clonazePAM (KLONOPIN) 0.5 MG tablet, Take 1 tablet (0.5 mg total) by mouth daily as  needed., Disp: 30 tablet, Rfl: 2   HYDROcodone-acetaminophen (NORCO) 7.5-325 MG tablet, Take 1 tablet by mouth every 12 (twelve) hours as needed for moderate pain (pain score 4-6). Please do not fill until 10/01/23, Disp: 60 tablet, Rfl:  0   Vitamin D, Ergocalciferol, (DRISDOL) 1.25 MG (50000 UNIT) CAPS capsule, TAKE 1 CAPSULE BY MOUTH EVERY 7 DAYS **NOT COVERED**, Disp: 12 capsule, Rfl: 0   No Known Allergies    The patient states she uses status post hysterectomy for birth control. No LMP recorded. Patient has had a hysterectomy.. Negative for Dysmenorrhea and Negative for Menorrhagia. Negative for: breast discharge, breast lump(s), breast pain and breast self exam. Associated symptoms include abnormal vaginal bleeding. Pertinent negatives include abnormal bleeding (hematology), anxiety, decreased libido, depression, difficulty falling sleep, dyspareunia, history of infertility, nocturia, sexual dysfunction, sleep disturbances, urinary incontinence, urinary urgency, vaginal discharge and vaginal itching. Diet regular. The patient states her exercise level is none due to her pain.   The patient's tobacco use is:  Social History   Tobacco Use  Smoking Status Former   Current packs/day: 0.50   Average packs/day: 0.5 packs/day for 25.6 years (12.8 ttl pk-yrs)   Types: Cigarettes   Start date: 03/24/2019   Passive exposure: Current  Smokeless Tobacco Never  Tobacco Comments   06/24/21- quit over a year ago  She has been exposed to passive smoke. The patient's alcohol use is:  Social History   Substance and Sexual Activity  Alcohol Use No    Review of Systems  Constitutional: Negative.  Negative for fatigue.  HENT: Negative.    Eyes: Negative.   Respiratory: Negative.    Cardiovascular: Negative.  Negative for chest pain, palpitations and leg swelling.  Gastrointestinal: Negative.   Endocrine: Negative.  Negative for polydipsia, polyphagia and polyuria.  Genitourinary: Negative.    Musculoskeletal: Negative.   Skin: Negative.   Allergic/Immunologic: Negative.   Neurological: Negative.  Negative for dizziness.  Hematological: Negative.   Psychiatric/Behavioral:  Negative for confusion. The patient is nervous/anxious.      Today's Vitals   10/18/23 1414  BP: 120/70  Pulse: 97  Temp: 98.5 F (36.9 C)  TempSrc: Oral  Weight: 201 lb (91.2 kg)  Height: 5\' 6"  (1.676 m)  PainSc: 8   PainLoc: Foot   Body mass index is 32.44 kg/m.  Wt Readings from Last 3 Encounters:  10/25/23 198 lb (89.8 kg)  10/18/23 201 lb (91.2 kg)  10/11/23 197 lb (89.4 kg)     Objective:  Physical Exam Vitals reviewed.  Constitutional:      General: She is not in acute distress.    Appearance: Normal appearance. She is well-developed. She is obese.  HENT:     Head: Normocephalic and atraumatic.     Right Ear: Hearing, tympanic membrane, ear canal and external ear normal. There is no impacted cerumen.     Left Ear: Hearing, tympanic membrane, ear canal and external ear normal. There is no impacted cerumen.     Nose: Nose normal.     Mouth/Throat:     Mouth: Mucous membranes are moist.  Eyes:     General: Lids are normal.     Extraocular Movements: Extraocular movements intact.     Conjunctiva/sclera: Conjunctivae normal.     Pupils: Pupils are equal, round, and reactive to light.     Funduscopic exam:    Right eye: No papilledema.        Left eye: No papilledema.  Neck:     Thyroid: No thyroid mass.     Vascular: No carotid bruit.  Cardiovascular:     Rate and Rhythm: Normal rate and regular rhythm.     Pulses: Normal pulses.     Heart sounds: Normal  heart sounds. No murmur heard. Pulmonary:     Effort: Pulmonary effort is normal. No respiratory distress.     Breath sounds: Normal breath sounds. No wheezing.  Chest:     Chest wall: No mass.  Breasts:    Tanner Score is 5.     Right: Normal. No mass or tenderness.     Left: Normal. No mass or tenderness.  Abdominal:      General: Abdomen is flat. Bowel sounds are normal. There is no distension.     Palpations: Abdomen is soft.     Tenderness: There is no abdominal tenderness.  Genitourinary:    Comments: Deferred - followed by GYN Musculoskeletal:        General: No swelling or tenderness.     Right shoulder: No swelling or tenderness. Normal range of motion.     Left shoulder: No swelling, deformity or tenderness. Normal range of motion. Normal strength. Normal pulse.     Cervical back: Full passive range of motion without pain, normal range of motion and neck supple.     Right lower leg: No edema.     Left lower leg: No edema.  Lymphadenopathy:     Upper Body:     Right upper body: No supraclavicular, axillary or pectoral adenopathy.     Left upper body: No supraclavicular, axillary or pectoral adenopathy.  Skin:    General: Skin is warm and dry.     Capillary Refill: Capillary refill takes less than 2 seconds.     Comments: Several tattoos to arms  Neurological:     General: No focal deficit present.     Mental Status: She is alert and oriented to person, place, and time.     Cranial Nerves: No cranial nerve deficit.     Sensory: No sensory deficit.  Psychiatric:        Mood and Affect: Mood normal.        Behavior: Behavior normal.        Thought Content: Thought content normal.        Judgment: Judgment normal.        10/25/2023    9:51 AM 10/18/2023    3:08 PM 07/02/2023    2:12 PM 04/18/2023    2:22 PM 03/30/2023   10:51 AM  Depression screen PHQ 2/9  Decreased Interest 0 1 0 0 0  Down, Depressed, Hopeless 0 1 0 0 0  PHQ - 2 Score 0 2 0 0 0  Altered sleeping  3  0   Tired, decreased energy  2  0   Change in appetite  0  0   Feeling bad or failure about yourself   0  0   Trouble concentrating  1  0   Moving slowly or fidgety/restless  0  0   Suicidal thoughts  0  0   PHQ-9 Score  8  0   Difficult doing work/chores  Very difficult  Not difficult at all       10/18/2023     3:08 PM 04/18/2023    2:22 PM 01/31/2022    8:33 AM  GAD 7 : Generalized Anxiety Score  Nervous, Anxious, on Edge 2 0 1  Control/stop worrying 2 0 1  Worry too much - different things 2 0 1  Trouble relaxing 2 0 1  Restless 2 0 1  Easily annoyed or irritable 2 0 0  Afraid - awful might happen 2 0 0  Total GAD 7 Score  14 0 5  Anxiety Difficulty Very difficult Not difficult at all Not difficult at all         Assessment And Plan:     Encounter for annual health examination  Benign hypertensive heart disease with congestive heart failure and with combined systolic and diastolic dysfunction (HCC) Assessment & Plan: Blood pressure is well controlled, continue current medications and f/u with cardiology  Orders: -     EKG 12-Lead -     POCT URINALYSIS DIP (CLINITEK) -     Microalbumin / creatinine urine ratio  Type 2 diabetes mellitus with obesity (HCC) Assessment & Plan: Improving, continue current medications.   Orders: -     Hemoglobin A1c -     Lipid panel  Anxiety Assessment & Plan: Her anxiety level has been higher over the last few months. Continue current medications. I have reviewed the PDMP during this encounter. Refilled her clonazepam  Orders: -     buPROPion HCl ER (XL); Take 1 tablet (150 mg total) by mouth daily.  Dispense: 90 tablet; Refill: 1 -     clonazePAM; Take 1 tablet (0.5 mg total) by mouth daily as needed.  Dispense: 30 tablet; Refill: 2  Recurrent major depressive disorder, in partial remission [facility]) Assessment & Plan: Depression screen score is 8, continue current medications.   Orders: -     buPROPion HCl ER (XL); Take 1 tablet (150 mg total) by mouth daily.  Dispense: 90 tablet; Refill: 1  Influenza vaccination declined Assessment & Plan: Patient declined influenza vaccination at this time. Patient is aware that influenza vaccine prevents illness in 70% of healthy people, and reduces hospitalizations to 30-70% in elderly. This vaccine is  recommended annually. Education has been provided regarding the importance of this vaccine but patient still declined. Advised may receive this vaccine at local pharmacy or Health Dept.or vaccine clinic. Aware to provide a copy of the vaccination record if obtained from local pharmacy or Health Dept.  Pt is willing to accept risk associated with refusing vaccination.    COVID-19 vaccine administered Assessment & Plan: Covid 19 vaccine given in office observed for 15 minutes without any adverse reaction   Orders: -     Pfizer Comirnaty Covid-19 Vaccine 46yrs & older  Need for pneumococcal vaccination Assessment & Plan: Pneumonia vaccine given in office  Orders: -     Pneumococcal conjugate vaccine 20-valent  Class 1 obesity due to excess calories with body mass index (BMI) of 32.0 to 32.9 in adult, unspecified whether serious comorbidity present Assessment & Plan: She is encouraged to strive for BMI less than 30 to decrease cardiac risk. Advised to aim for at least 150 minutes of exercise per week while incorporating strengthy training    Other long term (current) drug therapy  Acute left ankle pain Assessment & Plan: Swelling to left malleolus. Advised to apply ice and keep elevated. Return call if worse or not better   Fall, initial encounter Assessment & Plan: Prior to coming to office she slipped in the mud. Encouraged to focus on fall risk.    Other orders -     Tirzepatide; Inject 10 mg into the skin once a week.  Dispense: 6 mL; Refill: 1     Return for 1 year physical, controlled DM check 4 months. Patient was given opportunity to ask questions. Patient verbalized understanding of the plan and was able to repeat key elements of the plan. All questions were answered to their satisfaction.   Lolita Cram  Christell Constant, FNP  I, Arnette Felts, FNP, have reviewed all documentation for this visit. The documentation on 10/18/23 for the exam, diagnosis, procedures, and orders are all  accurate and complete.

## 2023-10-18 NOTE — Assessment & Plan Note (Addendum)
 Swelling to left malleolus. Advised to apply ice and keep elevated. Return call if worse or not better

## 2023-10-19 LAB — LIPID PANEL
Chol/HDL Ratio: 2.7 {ratio} (ref 0.0–4.4)
Cholesterol, Total: 145 mg/dL (ref 100–199)
HDL: 53 mg/dL (ref >39)
LDL Chol Calc (NIH): 76 mg/dL (ref 0–99)
Triglycerides: 84 mg/dL (ref 0–149)
VLDL Cholesterol Cal: 16 mg/dL (ref 5–40)

## 2023-10-19 LAB — HEMOGLOBIN A1C
Est. average glucose Bld gHb Est-mCnc: 128 mg/dL
Hgb A1c MFr Bld: 6.1 % — ABNORMAL HIGH (ref 4.8–5.6)

## 2023-10-19 LAB — MICROALBUMIN / CREATININE URINE RATIO
Creatinine, Urine: 218 mg/dL
Microalb/Creat Ratio: 18 mg/g{creat} (ref 0–29)
Microalbumin, Urine: 38.6 ug/mL

## 2023-10-24 ENCOUNTER — Ambulatory Visit (INDEPENDENT_AMBULATORY_CARE_PROVIDER_SITE_OTHER): Payer: No Typology Code available for payment source

## 2023-10-24 ENCOUNTER — Ambulatory Visit
Admission: RE | Admit: 2023-10-24 | Discharge: 2023-10-24 | Disposition: A | Discharge status: Home / Self Care | Payer: No Typology Code available for payment source | Source: Ambulatory Visit | Attending: Nurse Practitioner | Admitting: Nurse Practitioner

## 2023-10-24 ENCOUNTER — Ambulatory Visit: Payer: No Typology Code available for payment source

## 2023-10-24 VITALS — BP 130/84 | HR 98 | Temp 98.2°F | Resp 18

## 2023-10-24 DIAGNOSIS — S93401A Sprain of unspecified ligament of right ankle, initial encounter: Secondary | ICD-10-CM

## 2023-10-24 DIAGNOSIS — M79671 Pain in right foot: Secondary | ICD-10-CM

## 2023-10-24 DIAGNOSIS — M25571 Pain in right ankle and joints of right foot: Secondary | ICD-10-CM

## 2023-10-24 NOTE — ED Provider Notes (Signed)
[facility]    CSN: [id number] Arrival date & time: 10/24/23  1424      History   Chief Complaint Chief Complaint  Patient presents with   Foot Injury    Entered by patient    HPI Virginia Douglas is a 48 y.o. female.   Patient presents today with 1 day history of right foot and ankle pain that began after missing a step and falling chasing after her dog ran outside abruptly.  She is having pain with weightbearing, but has been able to bear weight.  She is having pain in her ankle and foot.  She endorses swelling of the outside of her right ankle.  No numbness or tingling in the toes.  No redness, fever, nausea/vomiting since the pain began.    Past Medical History:  Diagnosis Date   Acute pain of left shoulder 06/05/2019   ADHD (attention deficit hyperactivity disorder)    Anemia    iron deficiency   as a teenager   Anxiety    Arthritis    Chronic combined systolic and diastolic congestive heart failure (HCC) 11/23/2022   This is a new diagnosis with Cardiology, she is scheduled on 2/14 for her MRI of her heart.     Depression    Diabetes (HCC)    Edema    GERD (gastroesophageal reflux disease)    History of congestive heart failure    dx by cardiology   Hypertension    patient denies- patient stated that the PCP said it was for heart prevention.   Left leg pain 04/07/2019   LLQ pain 12/08/2015   Migraine headache     Patient Active Problem List   Diagnosis Date Noted   Fall 10/18/2023   Acute left ankle pain 10/18/2023   HFrEF (heart failure with reduced ejection fraction) (HCC) 11/09/2022   Type 2 diabetes mellitus with obesity (HCC) 11/08/2022   DKA (diabetic ketoacidosis) (HCC) 10/13/2022   Cerebral venous thrombosis of sigmoid sinus 11/21/2021   Pulsatile tinnitus of right ear 11/21/2021   Pulsatile tinnitus, bilateral 06/27/2021   AVM (arteriovenous malformation) brain 05/31/2021   DDD (degenerative disc disease), cervical 12/06/2020    DDD (degenerative disc disease), lumbar 12/06/2020   Gastroesophageal reflux disease with esophagitis without hemorrhage 12/06/2020   Increased intracranial pressure 12/06/2020   PTSD (post-traumatic stress disorder) 12/06/2020   Attention deficit hyperactivity disorder (ADHD), predominantly inattentive type 12/06/2020   Vitamin D deficiency 12/06/2020   Obesity (BMI 30-39.9) 03/15/2020   Sciatic nerve pain 12/10/2019   Grief 06/05/2019   Migraine 06/05/2019   Type 2 diabetes mellitus with obesity (HCC) 04/07/2019   Depression 04/07/2019   Obesity due to excess calories without serious comorbidity 04/07/2019   Anxiety 04/07/2019   Headache 07/06/2018   Diabetes (HCC) 12/08/2015   Benign hypertensive heart disease with congestive heart failure and with combined systolic and diastolic dysfunction (HCC) 12/08/2015   Paresthesia 09/16/2015   Urinary urgency 09/16/2015    Past Surgical History:  Procedure Laterality Date   ABDOMINAL HYSTERECTOMY  2012   CHOLECYSTECTOMY     COLONOSCOPY  12/28/2020   polyps   ESOPHAGEAL DILATION     gamma knife     04/2022   IR ANGIO INTRA EXTRACRAN SEL COM CAROTID INNOMINATE BILAT MOD SED  05/31/2021   IR ANGIO INTRA EXTRACRAN SEL COM CAROTID INNOMINATE BILAT MOD SED  06/27/2021   IR ANGIO INTRA EXTRACRAN SEL COM CAROTID INNOMINATE UNI R MOD SED  11/21/2021   IR  ANGIO VERTEBRAL SEL VERTEBRAL UNI R MOD SED  05/31/2021   IR CT HEAD LTD  06/27/2021   IR CT HEAD LTD  11/21/2021   IR INTRA CRAN STENT  06/27/2021   IR INTRA CRAN STENT  11/21/2021   IR RADIOLOGIST EVAL & MGMT  06/03/2021   IR RADIOLOGIST EVAL & MGMT  07/13/2021   IR RADIOLOGIST EVAL & MGMT  08/27/2021   IR RADIOLOGIST EVAL & MGMT  10/30/2021   IR RADIOLOGIST EVAL & MGMT  12/06/2021   IR US GUIDE VASC ACCESS RIGHT  05/31/2021   IR US GUIDE VASC ACCESS RIGHT  06/27/2021   IR US GUIDE VASC ACCESS RIGHT  11/21/2021   OVARIAN CYST REMOVAL     RADIOLOGY WITH ANESTHESIA N/A 06/27/2021    Procedure: STENTING;  Surgeon: Julieanne Cotton, MD;  Location: [facility] OR;  Service: Radiology;  Laterality: N/A;   RADIOLOGY WITH ANESTHESIA N/A 11/21/2021   Procedure: IR WITH ANESTHESIA STENTING;  Surgeon: Julieanne Cotton, MD;  Location: [facility] OR;  Service: Radiology;  Laterality: N/A;    OB History     Gravida  2   Para      Term      Preterm      AB      Living  2      SAB      IAB      Ectopic      Multiple      Live Births  2            Home Medications    Prior to Admission medications   Medication Sig Start Date End Date Taking? Authorizing Provider  acetaZOLAMIDE (DIAMOX) 250 MG tablet Take 1 tablet (250 mg total) by mouth 2 (two) times daily. 05/17/23   Jaffe, Adam R, DO  ADDERALL XR 30 MG 24 hr capsule Take 30 mg by mouth daily. 09/29/20   [provider]  albuterol (VENTOLIN HFA) 108 (90 Base) MCG/ACT inhaler Inhale 2 puffs into the lungs as needed. 12/12/22   [provider]  aspirin EC 81 MG tablet Take 81 mg by mouth daily. Swallow whole.    [provider]  atorvastatin (LIPITOR) 10 MG tablet TAKE 1 TABLET BY MOUTH EVERY DAY 02/07/23   Arnette Felts, FNP  Blood Pressure Monitoring Soln KIT  01/01/23   [provider]  buPROPion (WELLBUTRIN XL) 150 MG 24 hr tablet Take 1 tablet (150 mg total) by mouth daily. 10/18/23   Arnette Felts, FNP  carvedilol (COREG) 3.125 MG tablet Take 1 tablet (3.125 mg total) by mouth 2 (two) times daily. 08/08/23   Little Ishikawa, MD  cetirizine (ZYRTEC) 10 MG tablet Take 10 mg by mouth daily as needed for allergies.    [provider]  clonazePAM (KLONOPIN) 0.5 MG tablet Take 1 tablet (0.5 mg total) by mouth daily as needed. 10/18/23   Arnette Felts, FNP  clopidogrel (PLAVIX) 75 MG tablet Take 1 tablet (75 mg total) by mouth daily. 02/08/23   Lynnette Caffey A, PA-C  DULoxetine (CYMBALTA) 60 MG capsule TAKE 1 CAPSULE BY MOUTH EVERY DAY 06/22/23   Drema Dallas, DO   empagliflozin (JARDIANCE) 10 MG TABS tablet Take 1 tablet by mouth once daily in the morning 10/12/22   Little Ishikawa, MD  eszopiclone (LUNESTA) 2 MG TABS tablet Take 1 tablet (2 mg total) by mouth at bedtime. Take immediately before bedtime 01/15/23 01/15/24  Arnette Felts, FNP  famotidine (PEPCID) 20 MG tablet Take  20 mg by mouth daily. 11/09/21   [provider]  Galcanezumab-gnlm (EMGALITY) 120 MG/ML SOAJ Inject 120 mg into the skin every 30 (thirty) days. 05/17/23   Drema Dallas, DO  guanFACINE (INTUNIV) 1 MG TB24 ER tablet Take 1 mg by mouth daily. 12/07/22   [provider]  HYDROcodone-acetaminophen (NORCO/VICODIN) 5-325 MG tablet Take 1 tablet by mouth 2 (two) times daily as needed for moderate pain (pain score 4-6). 08/07/23   Fanny Dance, MD  hydroxychloroquine (PLAQUENIL) 200 MG tablet Take 1 tablet (200 mg total) by mouth 2 (two) times daily. 10/16/23   Gearldine Bienenstock, PA-C  insulin aspart (FIASP FLEXTOUCH) 100 UNIT/ML FlexTouch Pen INJECT [place] INSULIN BEFORE MEALS AND AT HS IF BS < 150 - 0 UNITS, 150-199 - 3 UNITS, 200-249 - 6 UNITS, 250-299 - 9 UNITS, 300-349 - 12 UNITS, >350 - 15 UNITS. IF >400 CALL OUR OFFICE WITH YOUR BLOOD SUGAR READINGS. 04/03/23   Arnette Felts, FNP  Insulin Pen Needle (PEN NEEDLES) 32G X 4 MM MISC 1 each by Does not apply route once a week. 12/08/22   Arnette Felts, FNP  Lancets [facility] DELICA PLUS [place]) MISC Apply 1 each topically 3 (three) times daily. 12/05/22   [provider]  LINZESS 72 MCG capsule TAKE 1 CAPSULE BY MOUTH DAILY BEFORE BREAKFAST. 01/12/23   Arnette Felts, FNP  phentermine (ADIPEX-P) 37.5 MG tablet Take 1 tablet (37.5 mg total) by mouth daily before breakfast. 01/15/23   Arnette Felts, FNP  Polyethyl Glycol-Propyl Glycol (SYSTANE) 0.4-0.3 % SOLN Place 1 drop into both eyes daily as needed (dry eyes).    [provider]  sacubitril-valsartan (ENTRESTO) 24-26 MG Take 1 tablet by mouth 2 (two) times daily.  02/28/23   Little Ishikawa, MD  spironolactone (ALDACTONE) 25 MG tablet Take 0.5 tablets (12.5 mg total) by mouth daily. 11/06/22 11/01/23  Little Ishikawa, MD  tirzepatide [facility]) 10 MG/0.5ML Pen Inject 10 mg into the skin once a week. 10/18/23   Arnette Felts, FNP  tiZANidine (ZANAFLEX) 4 MG tablet TAKE 1 TABLET (4 MG TOTAL) BY MOUTH EVERY 8 (EIGHT) HOURS AS NEEDED FOR MUSCLE SPASMS 11/02/22 11/02/23  Fanny Dance, MD  topiramate (TOPAMAX) 50 MG tablet TAKE 2 AND 1/2 TABLETS (125 MG TOTAL) BY MOUTH AT BEDTIME. Strength: 50 mg 09/06/23   Drema Dallas, DO  Vitamin D, Ergocalciferol, (DRISDOL) 1.25 MG (50000 UNIT) CAPS capsule TAKE 1 CAPSULE BY MOUTH EVERY 7 DAYS **NOT COVERED** 08/01/23   Arnette Felts, FNP  metFORMIN (GLUCOPHAGE-XR) 500 MG 24 hr tablet Take 2 tablets (1,000 mg total) by mouth 2 (two) times daily. Patient not taking: Reported on 06/05/2019 03/10/16 08/15/19  Maia Plan, MD  Phentermine-Topiramate 11.25-69 MG CP24 Take 1 tablet by mouth daily. 02/04/19 04/07/19  Arnette Felts, FNP    Family History Family History  Problem Relation Age of Onset   Diabetes Mother    Hypertension Mother    Diabetes Father    Hypertension Father    Kidney failure Father    Anxiety disorder Father    Hypertension Brother    Cancer Maternal Grandmother    Diabetes Maternal Grandmother    Pancreatic cancer Maternal Grandmother    Arthritis Maternal Grandmother    Diabetes Paternal Grandmother    Breast cancer Paternal Grandmother    Stroke Paternal Grandfather    Healthy Son    ADD / ADHD Son    Healthy Son    Asthma Son  Cancer Maternal Aunt        breast   Breast cancer Maternal Aunt    Birth defects Maternal Aunt     Social History Social History   Tobacco Use   Smoking status: Former    Current packs/day: 0.50    Average packs/day: 0.5 packs/day for 25.6 years (12.8 ttl pk-yrs)    Types: Cigarettes    Start date: 03/24/2019    Passive exposure: Current    Smokeless tobacco: Never   Tobacco comments:    06/24/21- quit over a year ago  Vaping Use   Vaping status: Never Used  Substance Use Topics   Alcohol use: No   Drug use: No     Allergies   Patient has no known allergies.   Review of Systems Review of Systems Per HPI  Physical Exam Triage Vital Signs ED Triage Vitals  Encounter Vitals Group     BP 10/24/23 1437 130/84     Systolic BP Percentile --      Diastolic BP Percentile --      Pulse Rate 10/24/23 1437 98     Resp 10/24/23 1437 18     Temp 10/24/23 1437 98.2 F (36.8 C)     Temp Source 10/24/23 1437 Oral     SpO2 10/24/23 1437 98 %     Weight --      Height --      Head Circumference --      Peak Flow --      Pain Score 10/24/23 1447 8     Pain Loc --      Pain Education --      Exclude from Growth Chart --    No data found.  Updated Vital Signs BP 130/84 (BP Location: Right Arm)   Pulse 98   Temp 98.2 F (36.8 C) (Oral)   Resp 18   SpO2 98%   Visual Acuity Right Eye Distance:   Left Eye Distance:   Bilateral Distance:    Right Eye Near:   Left Eye Near:    Bilateral Near:     Physical Exam Vitals and nursing note reviewed.  Constitutional:      General: She is not in acute distress.    Appearance: Normal appearance. She is not toxic-appearing.  HENT:     Mouth/Throat:     Mouth: Mucous membranes are moist.     Pharynx: Oropharynx is clear.  Pulmonary:     Effort: Pulmonary effort is normal. No respiratory distress.  Musculoskeletal:     Comments: Inspection: mild swelling noted to right lateral malleolus; no bruising, obvious deformity or redness Palpation: exquisitely tender to palpation diffusely right foot and ankle; no obvious deformities palpated ROM: unable to assess secondary to pain; patient able to bear weight on right lower extremity Strength: difficult to assess secondary ot pain Neurovascular: neurovascularly intact in distal right lower extremity   Skin:    General:  Skin is warm and dry.     Capillary Refill: Capillary refill takes less than 2 seconds.     Coloration: Skin is not jaundiced or pale.     Findings: No erythema.  Neurological:     Mental Status: She is alert and oriented to person, place, and time.  Psychiatric:        Behavior: Behavior is cooperative.      UC Treatments / Results  Labs (all labs ordered are listed, but only abnormal results are displayed) Labs Reviewed - No data to  display  EKG   Radiology DG Ankle Complete Right Result Date: 10/24/2023 CLINICAL DATA:  Right ankle pain after fall. EXAM: RIGHT ANKLE - COMPLETE 3+ VIEW COMPARISON:  None Available. FINDINGS: There is no evidence of fracture, dislocation, or joint effusion. There is no evidence of arthropathy or other focal bone abnormality. Soft tissues are unremarkable. IMPRESSION: Negative. Electronically Signed   By: Lupita Raider M.D.   On: 10/24/2023 15:17   DG Foot Complete Right Result Date: 10/24/2023 CLINICAL DATA:  Right foot pain after fall. EXAM: RIGHT FOOT COMPLETE - 3+ VIEW COMPARISON:  October 11, 2023. FINDINGS: There is no evidence of fracture or dislocation. There is no evidence of arthropathy or other focal bone abnormality. Soft tissues are unremarkable. IMPRESSION: Negative. Electronically Signed   By: Lupita Raider M.D.   On: 10/24/2023 15:16    Procedures Procedures (including critical care time)  Medications Ordered in UC Medications - No data to display  Initial Impression / Assessment and Plan / UC Course  I have reviewed the triage vital signs and the nursing notes.  Pertinent labs & imaging results that were available during my care of the patient were reviewed by me and considered in my medical decision making (see chart for details).   Patient is well-appearing, normotensive, afebrile, not tachycardic, not tachypneic, oxygenating well on room air.    1. Acute right ankle pain 2. Acute foot pain, right 3. Sprain of right  ankle, unspecified ligament, initial encounter No red flags Ankle and foot x-rays are negative for broken bones today, findings discussed with patient Ace wrap/ASO brace applied, recommended rest, ice, compression, elevation ER and return precautions discussed  The patient was given the opportunity to ask questions.  All questions answered to their satisfaction.  The patient is in agreement to this plan.    Final Clinical Impressions(s) / UC Diagnoses   Final diagnoses:  Acute right ankle pain  Acute foot pain, right  Sprain of right ankle, unspecified ligament, initial encounter     Discharge Instructions      The x-ray today does not show any broken bones in your foot or ankle.  You have likely sprained your ankle and/or your foot.  Please wear the Ace wrap when you are up walking around.  When you are sitting down, keep your foot elevated and apply ice 15 minutes on, 45 minutes off every hour while awake.  Seek care if symptoms do not improve with treatment.    ED Prescriptions   None    PDMP not reviewed this encounter.   Valentino Nose, NP 10/24/23 1550

## 2023-10-24 NOTE — ED Triage Notes (Addendum)
Pt reports fall, causing injury to the right ankle and foot. states she fell while chasing her Yorkie who ran outside. She missed a step running after him. Swelling is present in the ankle. Pain is present in the top and side of foot

## 2023-10-24 NOTE — Discharge Instructions (Signed)
The x-ray today does not show any broken bones in your foot or ankle.  You have likely sprained your ankle and/or your foot.  Please wear the Ace wrap when you are up walking around.  When you are sitting down, keep your foot elevated and apply ice 15 minutes on, 45 minutes off every hour while awake.  Seek care if symptoms do not improve with treatment.

## 2023-10-25 ENCOUNTER — Encounter: Payer: Self-pay | Admitting: Physical Medicine & Rehabilitation

## 2023-10-25 ENCOUNTER — Encounter
Payer: No Typology Code available for payment source | Attending: Physical Medicine & Rehabilitation | Admitting: Physical Medicine & Rehabilitation

## 2023-10-25 ENCOUNTER — Other Ambulatory Visit: Payer: Self-pay | Admitting: Nurse Practitioner

## 2023-10-25 VITALS — Ht 66.0 in | Wt 198.0 lb

## 2023-10-25 DIAGNOSIS — G8929 Other chronic pain: Secondary | ICD-10-CM | POA: Insufficient documentation

## 2023-10-25 DIAGNOSIS — G894 Chronic pain syndrome: Secondary | ICD-10-CM

## 2023-10-25 DIAGNOSIS — Z5181 Encounter for therapeutic drug level monitoring: Secondary | ICD-10-CM | POA: Diagnosis not present

## 2023-10-25 DIAGNOSIS — M545 Low back pain, unspecified: Secondary | ICD-10-CM

## 2023-10-25 DIAGNOSIS — Z79891 Long term (current) use of opiate analgesic: Secondary | ICD-10-CM

## 2023-10-25 DIAGNOSIS — M0520 Rheumatoid vasculitis with rheumatoid arthritis of unspecified site: Secondary | ICD-10-CM | POA: Diagnosis present

## 2023-10-25 DIAGNOSIS — M797 Fibromyalgia: Secondary | ICD-10-CM | POA: Diagnosis not present

## 2023-10-25 MED ORDER — HYDROCODONE-ACETAMINOPHEN 7.5-325 MG PO TABS: 1.0000 | ORAL_TABLET | Freq: Two times a day (BID) | ORAL | 0 refills | Status: DC | PRN

## 2023-10-25 MED ORDER — HYDROCODONE-ACETAMINOPHEN 7.5-325 MG PO TABS: 1.0000 | ORAL_TABLET | Freq: Four times a day (QID) | ORAL | 0 refills | Status: DC | PRN

## 2023-10-25 NOTE — Progress Notes (Deleted)
Subjective:    Patient ID: Virginia Douglas, female    DOB: 1975/11/06, 48 y.o.   MRN: [id number]  HPI   Pain Inventory Average Pain 9 Pain Right Now 9 My pain is constant, sharp, burning, dull, stabbing, tingling, and aching  In the last 24 hours, has pain interfered with the following? General activity 10 Relation with others 0 Enjoyment of life 8 What TIME of day is your pain at its worst? morning , daytime, evening, and night Sleep (in general) Poor  Pain is worse with: walking, bending, sitting, inactivity, standing, and some activites Pain improves with: rest and medication Relief from Meds: 1  Family History  Problem Relation Age of Onset  . Diabetes Mother   . Hypertension Mother   . Diabetes Father   . Hypertension Father   . Kidney failure Father   . Anxiety disorder Father   . Hypertension Brother   . Cancer Maternal Grandmother   . Diabetes Maternal Grandmother   . Pancreatic cancer Maternal Grandmother   . Arthritis Maternal Grandmother   . Diabetes Paternal Grandmother   . Breast cancer Paternal Grandmother   . Stroke Paternal Grandfather   . Healthy Son   . ADD / ADHD Son   . Healthy Son   . Asthma Son   . Cancer Maternal Aunt        breast  . Breast cancer Maternal Aunt   . Birth defects Maternal Aunt    Social History   Socioeconomic History  . Marital status: Legally Separated    Spouse name: Fayrene Fearing  . Number of children: 2  . Years of education: 88  . Highest education level: Associate degree: occupational, Scientist, product/process development, or vocational program  Occupational History  . Occupation: Geophysical data processor: [employer]  Tobacco Use  . Smoking status: Former    Current packs/day: 0.50    Average packs/day: 0.5 packs/day for 25.6 years (12.8 ttl pk-yrs)    Types: Cigarettes    Start date: 03/24/2019    Passive exposure: Current  . Smokeless tobacco: Never  . Tobacco comments:    06/24/21- quit over a year ago   Vaping Use  . Vaping status: Never Used  Substance and Sexual Activity  . Alcohol use: No  . Drug use: No  . Sexual activity: Yes    Birth control/protection: Surgical  Other Topics Concern  . Not on file  Social History Narrative   In relationship, 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 She lives with her 2 children in a one story house. She drinks one large cup of coffee a day and an occasional tea or soda. She walks daily.      One story home      Social Drivers of Health   Financial Resource Strain: Medium Risk (10/18/2023)   Overall Financial Resource Strain (CARDIA)   . Difficulty of Paying Living Expenses: Somewhat hard  Food Insecurity: No Food Insecurity (10/18/2023)   Hunger Vital Sign   . Worried About Programme researcher, broadcasting/film/video in the Last Year: Never true   . Ran Out of Food in the Last Year: Never true  Transportation Needs: Unmet Transportation Needs (10/18/2023)   PRAPARE - Transportation   . Lack of Transportation (Medical): Yes   . Lack of Transportation (Non-Medical): Yes  Physical Activity: Insufficiently Active (10/18/2023)   Exercise Vital Sign   . Days of Exercise per Week: 1 day   . Minutes of Exercise per Session: 20 min  Stress: Stress Concern Present (10/18/2023)   [employer] of Occupational Health - Occupational Stress Questionnaire   . Feeling of Stress : To some extent  Social Connections: Unknown (10/18/2023)   Social Connection and Isolation Panel [NHANES]   . Frequency of Communication with Friends and Family: More than three times a week   . Frequency of Social Gatherings with Friends and Family: Once a week   . Attends Religious Services: More than 4 times per year   . Active Member of Clubs or Organizations: Yes   . Attends Banker Meetings: 1 to 4 times per year   . Marital Status:  Patient declined   Past Surgical History:  Procedure Laterality Date  . ABDOMINAL HYSTERECTOMY  2012  . CHOLECYSTECTOMY    . COLONOSCOPY  12/28/2020   polyps  . ESOPHAGEAL DILATION    . gamma knife     04/2022  . IR ANGIO INTRA EXTRACRAN SEL COM CAROTID INNOMINATE BILAT MOD SED  05/31/2021  . IR ANGIO INTRA EXTRACRAN SEL COM CAROTID INNOMINATE BILAT MOD SED  06/27/2021  . IR ANGIO INTRA EXTRACRAN SEL COM CAROTID INNOMINATE UNI R MOD SED  11/21/2021  . IR ANGIO VERTEBRAL SEL VERTEBRAL UNI R MOD SED  05/31/2021  . IR CT HEAD LTD  06/27/2021  . IR CT HEAD LTD  11/21/2021  . IR INTRA CRAN STENT  06/27/2021  . IR INTRA CRAN STENT  11/21/2021  . IR RADIOLOGIST EVAL & MGMT  06/03/2021  . IR RADIOLOGIST EVAL & MGMT  07/13/2021  . IR RADIOLOGIST EVAL & MGMT  08/27/2021  . IR RADIOLOGIST EVAL & MGMT  10/30/2021  . IR RADIOLOGIST EVAL & MGMT  12/06/2021  . IR US GUIDE VASC ACCESS RIGHT  05/31/2021  . IR US GUIDE VASC ACCESS RIGHT  06/27/2021  . IR US GUIDE VASC ACCESS RIGHT  11/21/2021  . OVARIAN CYST REMOVAL    . RADIOLOGY WITH ANESTHESIA N/A 06/27/2021   Procedure: STENTING;  Surgeon: Julieanne Cotton, MD;  Location: [facility] OR;  Service: Radiology;  Laterality: N/A;  . RADIOLOGY WITH ANESTHESIA N/A 11/21/2021   Procedure: IR WITH ANESTHESIA STENTING;  Surgeon: Julieanne Cotton, MD;  Location: [facility] OR;  Service: Radiology;  Laterality: N/A;   Past Surgical History:  Procedure Laterality Date  . ABDOMINAL HYSTERECTOMY  2012  . CHOLECYSTECTOMY    . COLONOSCOPY  12/28/2020   polyps  . ESOPHAGEAL DILATION    . gamma knife     04/2022  . IR ANGIO INTRA EXTRACRAN SEL COM CAROTID INNOMINATE BILAT MOD SED  05/31/2021  . IR ANGIO INTRA EXTRACRAN SEL COM CAROTID INNOMINATE BILAT MOD SED  06/27/2021  . IR ANGIO INTRA EXTRACRAN SEL COM CAROTID INNOMINATE UNI R MOD SED  11/21/2021  . IR ANGIO VERTEBRAL SEL VERTEBRAL UNI R MOD SED  05/31/2021  . IR CT HEAD LTD  06/27/2021  . IR CT HEAD LTD   11/21/2021  . IR INTRA CRAN STENT  06/27/2021  . IR INTRA CRAN STENT  11/21/2021  . IR RADIOLOGIST EVAL & MGMT  06/03/2021  . IR RADIOLOGIST EVAL & MGMT  07/13/2021  . IR RADIOLOGIST EVAL & MGMT  08/27/2021  . IR RADIOLOGIST EVAL & MGMT  10/30/2021  . IR RADIOLOGIST EVAL & MGMT  12/06/2021  . IR US GUIDE  VASC ACCESS RIGHT  05/31/2021  . IR US GUIDE VASC ACCESS RIGHT  06/27/2021  . IR US GUIDE VASC ACCESS RIGHT  11/21/2021  . OVARIAN CYST REMOVAL    . RADIOLOGY WITH ANESTHESIA N/A 06/27/2021   Procedure: STENTING;  Surgeon: Julieanne Cotton, MD;  Location: [facility];  Service: Radiology;  Laterality: N/A;  . RADIOLOGY WITH ANESTHESIA N/A 11/21/2021   Procedure: IR WITH ANESTHESIA STENTING;  Surgeon: Julieanne Cotton, MD;  Location: [facility] OR;  Service: Radiology;  Laterality: N/A;   Past Medical History:  Diagnosis Date  . Acute pain of left shoulder 06/05/2019  . ADHD (attention deficit hyperactivity disorder)   . Anemia    iron deficiency   as a teenager  . Anxiety   . Arthritis   . Chronic combined systolic and diastolic congestive heart failure (HCC) 11/23/2022   This is a new diagnosis with Cardiology, she is scheduled on 2/14 for her MRI of her heart.    . Depression   . Diabetes (HCC)   . Edema   . GERD (gastroesophageal reflux disease)   . History of congestive heart failure    dx by cardiology  . Hypertension    patient denies- patient stated that the PCP said it was for heart prevention.  . Left leg pain 04/07/2019  . LLQ pain 12/08/2015  . Migraine headache    Ht 5\' 6"  (1.676 m)   Wt 198 lb (89.8 kg)   BMI 31.96 kg/m   Opioid Risk Score:   Fall Risk Score:  `1  Depression screen [facility] 2/9     10/25/2023    9:51 AM 10/18/2023    3:08 PM 07/02/2023    2:12 PM 04/18/2023    2:22 PM 03/30/2023   10:51 AM 01/15/2023    5:00 PM 12/19/2022    9:45 AM  Depression screen PHQ 2/9  Decreased Interest 0 1 0 0 0 0 0  Down, Depressed, Hopeless 0 1 0 0 0 0 0  PHQ - 2 Score  0 2 0 0 0 0 0  Altered sleeping  3  0  3   Tired, decreased energy  2  0  1   Change in appetite  0  0  0   Feeling bad or failure about yourself   0  0  0   Trouble concentrating  1  0  1   Moving slowly or fidgety/restless  0  0  0   Suicidal thoughts  0  0  0   PHQ-9 Score  8  0  5   Difficult doing work/chores  Very difficult  Not difficult at all  Not difficult at all     Review of Systems  Musculoskeletal:  Positive for arthralgias, back pain and neck pain.       Pain all over the body: hips, legs, feet, shoulders, hands, arms  All other systems reviewed and are negative.      Objective:   Physical Exam        Assessment & Plan:

## 2023-10-25 NOTE — Progress Notes (Signed)
Subjective:    Virginia Douglas ID: Virginia Douglas, female    DOB: 03/14/1976, 48 y.o.   MRN: [id number]  HPI    HPI HPI  09/22/22 Virginia Douglas is a 48 y.o. year old female  who  has a past medical history of ADHD (attention deficit hyperactivity disorder), Anemia, Anxiety, Arthritis, Diabetes (HCC), Edema, GERD (gastroesophageal reflux disease), Hypertension, and Migraine headache.   They are presenting to PM&R clinic as a new Virginia Douglas for pain management evaluation. They were referred  for treatment of chronic pain.  She has had chronic pain for many years.  Currently her worst pain is in her lower back.  She reports she recently had worsening of her pain in her shoulder and neck and she says she thinks this is worsened by stress.  Sometimes the pain in her lower back will shoot down her legs.  She reports having history of fibromyalgia.  She often sleeps with multiple pillows to help take the pressure off her joints.  She also wears a back brace during the day.  [facility]  She reports multiple ESI's and these did help the pain however there caused a lot of weight gain.  She reports that she was previously on hydrocodone however she stopped following with her pain doctor because they kept pushing her to do back injections and she was unhappy with the weight gain which she felt was associated with these injections.  She has very poor sleep.  Ports her mood is okay currently although has had depression in the past.  She has been seen by Dr. Christell Constant orthopedics for her neck and shoulder pain.  She does report some altered sensation in her hands and feet, she has a history of diabetes mellitus however this is not causing her significant pain.   She currently has a heart monitor in place due to concerns of arrhythmia.   She is followed by rheumatology for Raynaud's disease, polyarthralgia, sicca complex.   Red flag symptoms: Virginia Douglas denies  saddle anesthesia, loss of bowel or bladder continence, new weakness, new numbness/tingling, or pain waking up at nighttime.   Medications tried: Tylenol and NSAIDS minimal benefit Takes Tumaric Butrans- minimal benefit Tramadol didn't help Gabapentin and Lyrica didn't help Hydrocodone was helping the pain Has not tried Tylenol 3 Tizanidine did not help her pain in the past.       Other treatments: Tens minimal help Epidurals helped but caused weight gain [facility] Brace [facility]   Interval history 11/23/22 Virginia Douglas is here regarding follow-up of her chronic pain.  She continues to use Cymbalta and tizanidine, these medications are not keeping her pain controlled currently.  She continues to have severe pain in her back, shoulders, neck, legs.  ESI's did help in the past however it caused significant weight gain and she would not like to repeat this.  She feels that her activities continue to be severely limited by her pain.  She reports blood glucose has been elevated recently and she had a recent admission for DKA.     Interval history 12/19/2022 Virginia Douglas is here for follow-up of her chronic pain.  Her worst pain is currently in her shoulders and lower back today.  Current medications of Cymbalta and tizanidine are not keeping his pain well-controlled.  She again reports she wishes she could complete ESI's however this resulted in significant weight gain.  She has had a  busy work schedule and has been under a lot of stress.  She is trying to get scheduled for physical therapy and is trying to work around her schedule.     Interval history 02/02/23 Virginia Douglas reports poor results with tylenol #3, she says this provides minimal benefit for her pain. Denies any side effects with the medications.  She has not had PT recently, it helped in the past.  Norco medication was helpful for her pain in the past.    Interval history 03/30/23 Virginia Douglas is here to  follow-up regarding her chronic pain.  She feels that Norco is helping more than Tylenol 3 and helping keep the pain under better control overall.  It allows her to be more active and is not causing any significant side effects.  She reports she was unable to do physical therapy due to scheduling issues.  She is getting a standing desk for her work.  She also reports follow-up with rheumatology scheduled, she noted a rash on her face recently and is being screened for additional rheumatological conditions.  She denies recent alcohol use.    Interval history 07/02/23 Virginia Douglas seen by video visit today.  She continues to have a lot of pain throughout her body but worse in her lower back, legs and hips recently.  Norco 5 mg does help reduce her pain and make it more tolerable.  No side effects with the medication.  She has recently moved so would like a location closer to her new home for aquatic therapy.  Interval history 07/02/23 Virginia Douglas continues to have pain throughout multiple areas of her body including her shoulders, knees, lower back.  Her worst pain is in her lower back.  She reports that lower back pain has worsened over the past few years.  She has been unable to complete aquatic therapy due to her schedule.  Norco 5 as needed continues to help her pain, no side effects reported.   She reports having numbness in her hand when she wakes up particular on the right side.  Numbness improves as she goes through her day.  Interval history 10/24/22 Virginia Douglas is here for follow-up regarding her chronic pain.  She continues to follow-up with rheumatology and had ultrasound completed of bilateral hands to evaluate for synovitis.  She was found to have synovitis in bilateral second MCP joints and left third MCP joints.  She started on hydroxychloroquine by rheumatology for seronegative rheumatoid arthritis.  She was reporting stiffness in her hands that lasted several hours in the morning.  Virginia Douglas reports  Norco 5 mg is reducing her pain however she does not feel it strong enough to keep her pain controlled.  She feels that pain continues to limit her activity significantly.  She continues to have pain throughout her body but her lower back is particularly painful.  She denies any side effects with the Norco.  Pain Inventory Average Pain 9 Pain Right Now 9 My pain is constant, sharp, burning, dull, stabbing, tingling, and aching  In the last 24 hours, has pain interfered with the following? General activity 10 Relation with others 0 Enjoyment of life 8 What TIME of day is your pain at its worst? varies Sleep (in general) Poor  Pain is worse with: walking, bending, sitting, standing, and some activites Pain improves with: rest and medication Relief from Meds: 1  Family History  Problem Relation Age of Onset   Diabetes Mother    Hypertension Mother    Diabetes Father  Hypertension Father    Kidney failure Father    Anxiety disorder Father    Hypertension Brother    Cancer Maternal Grandmother    Diabetes Maternal Grandmother    Pancreatic cancer Maternal Grandmother    Arthritis Maternal Grandmother    Diabetes Paternal Grandmother    Breast cancer Paternal Grandmother    Stroke Paternal Grandfather    Healthy Son    ADD / ADHD Son    Healthy Son    Asthma Son    Cancer Maternal Aunt        breast   Breast cancer Maternal Aunt    Birth defects Maternal Aunt    Social History   Socioeconomic History   Marital status: Legally Separated    Spouse name: Fayrene Fearing   Number of children: 2   Years of education: 16   Highest education level: Associate degree: occupational, Scientist, product/process development, or vocational program  Occupational History   Occupation: Geophysical data processor: [employer]  Tobacco Use   Smoking status: Former    Current packs/day: 0.50    Average packs/day: 0.5 packs/day for 25.6 years (12.8 ttl pk-yrs)    Types: Cigarettes    Start date:  03/24/2019    Passive exposure: Current   Smokeless tobacco: Never   Tobacco comments:    06/24/21- quit over a year ago  Vaping Use   Vaping status: Never Used  Substance and Sexual Activity   Alcohol use: No   Drug use: No   Sexual activity: Yes    Birth control/protection: Surgical  Other Topics Concern   Not on file  Social History Narrative   In relationship, 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 She lives with her 2 children in a one story house. She drinks one large cup of coffee a day and an occasional tea or soda. She walks daily.      One story home      Social Drivers of Health   Financial Resource Strain: Medium Risk (10/18/2023)   Overall Financial Resource Strain (CARDIA)    Difficulty of Paying Living Expenses: Somewhat hard  Food Insecurity: No Food Insecurity (10/18/2023)   Hunger Vital Sign    Worried About Running Out of Food in the Last Year: Never true    Ran Out of Food in the Last Year: Never true  Transportation Needs: Unmet Transportation Needs (10/18/2023)   PRAPARE - Transportation    Lack of Transportation (Medical): Yes    Lack of Transportation (Non-Medical): Yes  Physical Activity: Insufficiently Active (10/18/2023)   Exercise Vital Sign    Days of Exercise per Week: 1 day    Minutes of Exercise per Session: 20 min  Stress: Stress Concern Present (10/18/2023)   [employer] of Occupational Health - Occupational Stress Questionnaire    Feeling of Stress : To some extent  Social Connections: Unknown (10/18/2023)   Social Connection and Isolation Panel [NHANES]    Frequency of Communication with Friends and Family: More than three times a week    Frequency of Social Gatherings with Friends and Family: Once a week    Attends Religious Services: More than 4 times per year    Active Member of [employer] or  Organizations: Yes    Attends Banker Meetings: 1 to 4 times per year  Marital Status: Virginia Douglas declined   Past Surgical History:  Procedure Laterality Date   ABDOMINAL HYSTERECTOMY  2012   CHOLECYSTECTOMY     COLONOSCOPY  12/28/2020   polyps   ESOPHAGEAL DILATION     gamma knife     04/2022   IR ANGIO INTRA EXTRACRAN SEL COM CAROTID INNOMINATE BILAT MOD SED  05/31/2021   IR ANGIO INTRA EXTRACRAN SEL COM CAROTID INNOMINATE BILAT MOD SED  06/27/2021   IR ANGIO INTRA EXTRACRAN SEL COM CAROTID INNOMINATE UNI R MOD SED  11/21/2021   IR ANGIO VERTEBRAL SEL VERTEBRAL UNI R MOD SED  05/31/2021   IR CT HEAD LTD  06/27/2021   IR CT HEAD LTD  11/21/2021   IR INTRA CRAN STENT  06/27/2021   IR INTRA CRAN STENT  11/21/2021   IR RADIOLOGIST EVAL & MGMT  06/03/2021   IR RADIOLOGIST EVAL & MGMT  07/13/2021   IR RADIOLOGIST EVAL & MGMT  08/27/2021   IR RADIOLOGIST EVAL & MGMT  10/30/2021   IR RADIOLOGIST EVAL & MGMT  12/06/2021   IR US GUIDE VASC ACCESS RIGHT  05/31/2021   IR US GUIDE VASC ACCESS RIGHT  06/27/2021   IR US GUIDE VASC ACCESS RIGHT  11/21/2021   OVARIAN CYST REMOVAL     RADIOLOGY WITH ANESTHESIA N/A 06/27/2021   Procedure: STENTING;  Surgeon: Julieanne Cotton, MD;  Location: [facility] OR;  Service: Radiology;  Laterality: N/A;   RADIOLOGY WITH ANESTHESIA N/A 11/21/2021   Procedure: IR WITH ANESTHESIA STENTING;  Surgeon: Julieanne Cotton, MD;  Location: [facility] OR;  Service: Radiology;  Laterality: N/A;   Past Surgical History:  Procedure Laterality Date   ABDOMINAL HYSTERECTOMY  2012   CHOLECYSTECTOMY     COLONOSCOPY  12/28/2020   polyps   ESOPHAGEAL DILATION     gamma knife     04/2022   IR ANGIO INTRA EXTRACRAN SEL COM CAROTID INNOMINATE BILAT MOD SED  05/31/2021   IR ANGIO INTRA EXTRACRAN SEL COM CAROTID INNOMINATE BILAT MOD SED  06/27/2021   IR ANGIO INTRA EXTRACRAN SEL COM CAROTID INNOMINATE UNI R MOD SED  11/21/2021   IR ANGIO VERTEBRAL SEL VERTEBRAL UNI R MOD  SED  05/31/2021   IR CT HEAD LTD  06/27/2021   IR CT HEAD LTD  11/21/2021   IR INTRA CRAN STENT  06/27/2021   IR INTRA CRAN STENT  11/21/2021   IR RADIOLOGIST EVAL & MGMT  06/03/2021   IR RADIOLOGIST EVAL & MGMT  07/13/2021   IR RADIOLOGIST EVAL & MGMT  08/27/2021   IR RADIOLOGIST EVAL & MGMT  10/30/2021   IR RADIOLOGIST EVAL & MGMT  12/06/2021   IR US GUIDE VASC ACCESS RIGHT  05/31/2021   IR US GUIDE VASC ACCESS RIGHT  06/27/2021   IR US GUIDE VASC ACCESS RIGHT  11/21/2021   OVARIAN CYST REMOVAL     RADIOLOGY WITH ANESTHESIA N/A 06/27/2021   Procedure: STENTING;  Surgeon: Julieanne Cotton, MD;  Location: [facility];  Service: Radiology;  Laterality: N/A;   RADIOLOGY WITH ANESTHESIA N/A 11/21/2021   Procedure: IR WITH ANESTHESIA STENTING;  Surgeon: Julieanne Cotton, MD;  Location: [facility] OR;  Service: Radiology;  Laterality: N/A;   Past Medical History:  Diagnosis Date   Acute pain of left shoulder 06/05/2019   ADHD (attention deficit hyperactivity disorder)    Anemia    iron deficiency   as a teenager   Anxiety    Arthritis    Chronic combined systolic and diastolic congestive  heart failure (HCC) 11/23/2022   This is a new diagnosis with Cardiology, she is scheduled on 2/14 for her MRI of her heart.     Depression    Diabetes (HCC)    Edema    GERD (gastroesophageal reflux disease)    History of congestive heart failure    dx by cardiology   Hypertension    Virginia Douglas denies- Virginia Douglas stated that the PCP said it was for heart prevention.   Left leg pain 04/07/2019   LLQ pain 12/08/2015   Migraine headache    Ht 5\' 6"  (1.676 m)   Wt 198 lb (89.8 kg)   BMI 31.96 kg/m   Opioid Risk Score:   Fall Risk Score:  `1  Depression screen [facility] 2/9     10/25/2023    9:51 AM 10/18/2023    3:08 PM 07/02/2023    2:12 PM 04/18/2023    2:22 PM 03/30/2023   10:51 AM 01/15/2023    5:00 PM 12/19/2022    9:45 AM  Depression screen PHQ 2/9  Decreased Interest 0 1 0 0 0 0 0  Down, Depressed,  Hopeless 0 1 0 0 0 0 0  PHQ - 2 Score 0 2 0 0 0 0 0  Altered sleeping  3  0  3   Tired, decreased energy  2  0  1   Change in appetite  0  0  0   Feeling bad or failure about yourself   0  0  0   Trouble concentrating  1  0  1   Moving slowly or fidgety/restless  0  0  0   Suicidal thoughts  0  0  0   PHQ-9 Score  8  0  5   Difficult doing work/chores  Very difficult  Not difficult at all  Not difficult at all       Review of Systems  Musculoskeletal:  Positive for back pain and neck pain.       B/L hip and shoulder   All other systems reviewed and are negative.      Objective:   Physical Exam   Gen: no distress, normal appearing HEENT: oral mucosa pink and moist, NCAT Chest: normal effort, normal rate of breathing Abd: soft, non-distended Ext: no edema Psych: Very pleasant, normal affect Skin: intact Neuro: Alert and oriented, follows commands, cranial nerves II through XII intact, normal speech and language Sensation intact light touch in all 4 extremities Strength intact to to gravity and resistance in all 4 extremities Musculoskeletal:  Lumbar paraspinal muscles bilaterally Slump test was negative bilaterally Spurling's test was negative TTP throughout bilateral upper and lower extremities TTP throughout periscapular muscles, few trigger points noted + Facet loading L-spine Pain noted with spinal extension  Prior Exam Tinel's test positive at right wrist, sensation and motor function intact throughout her right hand       MRI L spine  Segmentation: The lowest lumbar type non-rib-bearing vertebra is labeled as L5.   Alignment:  Mild levoconvex lumbar scoliosis without subluxation.   Vertebrae:  No significant vertebral marrow edema is identified.   Conus medullaris and cauda equina: Conus extends to the L1 level. Conus and cauda equina appear normal.   Paraspinal and other soft tissues: Unremarkable   Disc levels:   T12-L1: Unremarkable.   L1-2:  Unremarkable.   L2-3: Minimal disc bulge, no impingement.   L3-4: Minimal disc bulge, no impingement.   L4-5: Mild disc bulge, no impingement.  L5-S1: Borderline bilateral foraminal stenosis due to disc bulge and mild facet arthropathy   IMPRESSION: 1. Mild lumbar spondylosis and degenerative disc disease, causing borderline bilateral foraminal stenosis at L5-S1. 2. Mild levoconvex lumbar scoliosis without subluxation.     Performed     MRI C-spine 07/16/2022 1. Stable appearance of the cervical spine. 2. Minimal uncovertebral spurring at C6-7 and C7-T1 without significant stenosis at these levels. 3. Moderate right foraminal stenosis at T2-3 due to facet spurring. 4. Empty sella. This is nonspecific and most often incidental, but can be seen in the setting of idiopathic intracranial hypertension.        Assessment & Plan:   Chronic lower back pain and chronic neck pain -Prior MRI with minimal cervical spondylosis and mild lumbar spondylosis with mild degenerative disc disease resulting in borderline bilateral foraminal stenosis L5-S1, mild lumbar scoliosis, mild facet L5-S1 arthropathy -L spine MRI reviewed- multilevel mild disk bulging, facet arthropathy L4-5 and L5-S1 -Continue tizanidine  -Continue Norco but increase to 7.5 mg twice daily. -Virginia Douglas reports poor results with Tylenol 3-this was discontinued prior visit  -Provided list of foods that are good for chronic pain prior visit -Opiate risk tool moderate -UDS today -Continue to monitor PDMP, pill counts -Pill counts a couple pills low- verbal warning today -Pain agreement completed prior visit -Provided 6 weeks of home exercise program for lower back pain for complete 3 times a week -Consider MBB RFA L4-5, L5-S1 later visit if pain is not improved  Rheumatoid arthritis, seronegative -Followed by rheumatology,, recently started on Plaquenil   Fibromyalgia -Continue duloxetine, appears this medicine is  helping improve her pain -She reports poor response to gabapentin and Lyrica- considered retrying but she is concenred about wt gain  -Discussed gradual progressive low impact exercise -Counseled regarding sleep hygiene -Low-dose naltrexone could be considered at a later time, she would need to be off opioid medications -Previously discussed trigger point injections, consider completing at a later visit -Discussed option to try shockwave therapy -Discussed aquatic therapy-consult to [facility] placed prior visit-she is unable to complete due to her work schedule-this may be an option in the future when work hires more people, they are currently short staffed -She reports she has a [employer] that she could do pool exercises on her own-letter provided regarding benefit of therapy in the water -Advised trying Thera cane- she says she forgot but will try this now -Discussed trying yoga or tai chi     Depression -Improved with duloxetine -Denies SI or HI, she reports mood is stable   ADHD -She is on Adderall currently   History of AVM -Continue follow-up with neurosurgery, SP gamma knife radiation surgery plan for repeat brain MRI in 6 months  Right hand numbness -Potentially carpal tunnel syndrome, advised trying carpal tunnel brace at night -Consider electrodiagnostic study if worsens   Presence of THC on drug screen -Reports he was using a hemp cream previously, reports she discontinued use of this cream we talked about it on 1/7. -Repeat UDS today

## 2023-10-30 LAB — TOXASSURE SELECT,+ANTIDEPR,UR

## 2023-11-02 ENCOUNTER — Encounter: Payer: Self-pay | Admitting: Nurse Practitioner

## 2023-11-02 DIAGNOSIS — Z2821 Immunization not carried out because of patient refusal: Secondary | ICD-10-CM | POA: Insufficient documentation

## 2023-11-02 DIAGNOSIS — Z23 Encounter for immunization: Secondary | ICD-10-CM | POA: Insufficient documentation

## 2023-11-02 DIAGNOSIS — E66811 Obesity, class 1: Secondary | ICD-10-CM | POA: Insufficient documentation

## 2023-11-02 NOTE — Assessment & Plan Note (Signed)

## 2023-11-02 NOTE — Assessment & Plan Note (Signed)
 Blood pressure is well controlled, continue current medications and f/u with cardiology

## 2023-11-02 NOTE — Assessment & Plan Note (Signed)
 Pneumonia vaccine given in office

## 2023-11-02 NOTE — Assessment & Plan Note (Signed)
 She is encouraged to strive for BMI less than 30 to decrease cardiac risk. Advised to aim for at least 150 minutes of exercise per week while incorporating strengthy training

## 2023-11-02 NOTE — Assessment & Plan Note (Signed)
 Improving, continue current medications

## 2023-11-02 NOTE — Assessment & Plan Note (Signed)
 Prior to coming to office she slipped in the mud. Encouraged to focus on fall risk.

## 2023-11-02 NOTE — Assessment & Plan Note (Signed)
 Covid 19 vaccine given in office observed for 15 minutes without any adverse reaction

## 2023-11-02 NOTE — Assessment & Plan Note (Signed)
 Her anxiety level has been higher over the last few months. Continue current medications. I have reviewed the PDMP during this encounter. Refilled her clonazepam

## 2023-11-02 NOTE — Assessment & Plan Note (Signed)
 Depression screen score is 8, continue current medications.

## 2023-11-05 DIAGNOSIS — R21 Rash and other nonspecific skin eruption: Secondary | ICD-10-CM

## 2023-11-05 DIAGNOSIS — Z79899 Other long term (current) drug therapy: Secondary | ICD-10-CM

## 2023-11-05 DIAGNOSIS — R768 Other specified abnormal immunological findings in serum: Secondary | ICD-10-CM

## 2023-11-05 DIAGNOSIS — M0600 Rheumatoid arthritis without rheumatoid factor, unspecified site: Secondary | ICD-10-CM

## 2023-11-05 NOTE — Telephone Encounter (Signed)
 Recommend having updated CBC and CMP this week.   We can also add PT/INR, ESR and ANCA screen. We can schedule a sooner office visit pending results.   Please also clarify if the patient has a dermatologist?

## 2023-11-05 NOTE — Telephone Encounter (Signed)
 Reached out to the patient and she states for the last week she has had sores in her nose and sores in her mouth. Patient states she has also had a rash on her upper thigh. Patient states it itched at first but no longer does. Patient states she will send pictures via my chart.

## 2023-11-05 NOTE — Telephone Encounter (Signed)
 Please clarify if she has been taking plaquenil?  Any other medication changes?

## 2023-11-06 ENCOUNTER — Other Ambulatory Visit: Payer: Self-pay | Admitting: *Deleted

## 2023-11-06 DIAGNOSIS — M0600 Rheumatoid arthritis without rheumatoid factor, unspecified site: Secondary | ICD-10-CM

## 2023-11-06 DIAGNOSIS — Z79899 Other long term (current) drug therapy: Secondary | ICD-10-CM

## 2023-11-06 DIAGNOSIS — R768 Other specified abnormal immunological findings in serum: Secondary | ICD-10-CM

## 2023-11-06 NOTE — Telephone Encounter (Signed)
 Please also place referral to dermatology since this rash seems to be recurrent.

## 2023-11-06 NOTE — Telephone Encounter (Signed)
 Patient advised referral placed to dermatology.

## 2023-11-07 NOTE — Progress Notes (Signed)
 ESR WNL

## 2023-11-07 NOTE — Progress Notes (Signed)
 CBC WNL--platelet count WNL. INR/PT WNL  Alk phos remains elevated-please see if alk phos isoenzymes can be added.  AST and ALT WNL.  Rest of CMP stable.

## 2023-11-12 NOTE — Progress Notes (Signed)
 Alk phos remains elevated.  Isoenzymes were unremarkable.  We will continue to monitor.

## 2023-11-12 NOTE — Progress Notes (Deleted)
 Office Visit Note  Patient: Virginia Douglas             Date of Birth: 04-17-1976           MRN: [id number]             PCP: Arnette Felts, FNP Referring: Arnette Felts, FNP Visit Date: 11/26/2023 Occupation: @GUAROCC @  Subjective:    History of Present Illness: Virginia Douglas is a 48 y.o. female with history of seronegative rheumatoid arthritis and osteoarthritis.   CBC and CMP updated on 11/06/23.    Activities of Daily Living:  Patient reports morning stiffness for *** {minute/hour:19697}.   Patient {ACTIONS;DENIES/REPORTS:21021675::"Denies"} nocturnal pain.  Difficulty dressing/grooming: {ACTIONS;DENIES/REPORTS:21021675::"Denies"} Difficulty climbing stairs: {ACTIONS;DENIES/REPORTS:21021675::"Denies"} Difficulty getting out of chair: {ACTIONS;DENIES/REPORTS:21021675::"Denies"} Difficulty using hands for taps, buttons, cutlery, and/or writing: {ACTIONS;DENIES/REPORTS:21021675::"Denies"}  No Rheumatology ROS completed.   PMFS History:  Patient Active Problem List   Diagnosis Date Noted   Influenza vaccination declined 11/02/2023   COVID-19 vaccine administered 11/02/2023   Need for pneumococcal vaccination 11/02/2023   Class 1 obesity due to excess calories with body mass index (BMI) of 32.0 to 32.9 in adult 11/02/2023   Fall 10/18/2023   Acute left ankle pain 10/18/2023   HFrEF (heart failure with reduced ejection fraction) (HCC) 11/09/2022   Type 2 diabetes mellitus with obesity (HCC) 11/08/2022   DKA (diabetic ketoacidosis) (HCC) 10/13/2022   Cerebral venous thrombosis of sigmoid sinus 11/21/2021   Pulsatile tinnitus of right ear 11/21/2021   Pulsatile tinnitus, bilateral 06/27/2021   AVM (arteriovenous malformation) brain 05/31/2021   DDD (degenerative disc disease), cervical 12/06/2020   DDD (degenerative disc disease), lumbar 12/06/2020   Gastroesophageal reflux disease with esophagitis without hemorrhage 12/06/2020   Increased intracranial  pressure 12/06/2020   PTSD (post-traumatic stress disorder) 12/06/2020   Attention deficit hyperactivity disorder (ADHD), predominantly inattentive type 12/06/2020   Vitamin D deficiency 12/06/2020   Obesity (BMI 30-39.9) 03/15/2020   Sciatic nerve pain 12/10/2019   Grief 06/05/2019   Migraine 06/05/2019   Depression 04/07/2019   Obesity due to excess calories without serious comorbidity 04/07/2019   Anxiety 04/07/2019   Headache 07/06/2018   Benign hypertensive heart disease with congestive heart failure and with combined systolic and diastolic dysfunction (HCC) 12/08/2015   Type 2 diabetes mellitus with obesity (HCC) 12/08/2015   Paresthesia 09/16/2015    Past Medical History:  Diagnosis Date   Acute pain of left shoulder 06/05/2019   ADHD (attention deficit hyperactivity disorder)    Anemia    iron deficiency   as a teenager   Anxiety    Arthritis    Chronic combined systolic and diastolic congestive heart failure (HCC) 11/23/2022   This is a new diagnosis with Cardiology, she is scheduled on 2/14 for her MRI of her heart.     Depression    Diabetes (HCC)    Edema    GERD (gastroesophageal reflux disease)    History of congestive heart failure    dx by cardiology   Hypertension    patient denies- patient stated that the PCP said it was for heart prevention.   Left leg pain 04/07/2019   LLQ pain 12/08/2015   Migraine headache    Urinary urgency 09/16/2015    Family History  Problem Relation Age of Onset   Diabetes Mother    Hypertension Mother    Diabetes Father    Hypertension Father    Kidney failure Father    Anxiety disorder Father    Hypertension  Brother    Cancer Maternal Grandmother    Diabetes Maternal Grandmother    Pancreatic cancer Maternal Grandmother    Arthritis Maternal Grandmother    Diabetes Paternal Grandmother    Breast cancer Paternal Grandmother    Stroke Paternal Grandfather    Healthy Son    ADD / ADHD Son    Healthy Son    Asthma Son     Cancer Maternal Aunt        breast   Breast cancer Maternal Aunt    Birth defects Maternal Aunt    Past Surgical History:  Procedure Laterality Date   ABDOMINAL HYSTERECTOMY  2012   CHOLECYSTECTOMY     COLONOSCOPY  12/28/2020   polyps   ESOPHAGEAL DILATION     gamma knife     04/2022   IR ANGIO INTRA EXTRACRAN SEL COM CAROTID INNOMINATE BILAT MOD SED  05/31/2021   IR ANGIO INTRA EXTRACRAN SEL COM CAROTID INNOMINATE BILAT MOD SED  06/27/2021   IR ANGIO INTRA EXTRACRAN SEL COM CAROTID INNOMINATE UNI R MOD SED  11/21/2021   IR ANGIO VERTEBRAL SEL VERTEBRAL UNI R MOD SED  05/31/2021   IR CT HEAD LTD  06/27/2021   IR CT HEAD LTD  11/21/2021   IR INTRA CRAN STENT  06/27/2021   IR INTRA CRAN STENT  11/21/2021   IR RADIOLOGIST EVAL & MGMT  06/03/2021   IR RADIOLOGIST EVAL & MGMT  07/13/2021   IR RADIOLOGIST EVAL & MGMT  08/27/2021   IR RADIOLOGIST EVAL & MGMT  10/30/2021   IR RADIOLOGIST EVAL & MGMT  12/06/2021   IR US GUIDE VASC ACCESS RIGHT  05/31/2021   IR US GUIDE VASC ACCESS RIGHT  06/27/2021   IR US GUIDE VASC ACCESS RIGHT  11/21/2021   OVARIAN CYST REMOVAL     RADIOLOGY WITH ANESTHESIA N/A 06/27/2021   Procedure: STENTING;  Surgeon: Julieanne Cotton, MD;  Location: [facility] OR;  Service: Radiology;  Laterality: N/A;   RADIOLOGY WITH ANESTHESIA N/A 11/21/2021   Procedure: IR WITH ANESTHESIA STENTING;  Surgeon: Julieanne Cotton, MD;  Location: [facility] OR;  Service: Radiology;  Laterality: N/A;   Social History   Social History Narrative   In relationship, 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 She lives with her 2 children in a one story house. She drinks one large cup of coffee a day and an occasional tea or soda. She walks daily.      One story home      Immunization History  Administered Date(s) Administered   HPV 9-valent  04/11/2019   Influenza Inj Mdck Quad Pf 05/18/2018   Influenza,inj,Quad PF,6+ Mos 05/20/2017, 05/14/2019, 06/02/2020, 05/09/2022   Influenza-Unspecified 05/11/2017, 05/15/2018, 05/08/2019, 07/13/2021   Janssen (J&J) SARS-COV-2 Vaccination 11/23/2019   PFIZER(Purple Top)SARS-COV-2 Vaccination 07/11/2020   PNEUMOCOCCAL CONJUGATE-20 10/18/2023   Pfizer(Comirnaty)Fall Seasonal Vaccine 12 years and older 01/15/2023, 10/18/2023   Pneumococcal Polysaccharide-23 04/08/2019, 07/15/2019   Tdap 07/02/2017, 04/09/2019     Objective: Vital Signs: There were no vitals taken for this visit.   Physical Exam Vitals and nursing note reviewed.  Constitutional:      Appearance: She is well-developed.  HENT:     Head: Normocephalic and atraumatic.  Eyes:     Conjunctiva/sclera: Conjunctivae normal.  Cardiovascular:     Rate and Rhythm: Normal rate and  regular rhythm.     Heart sounds: Normal heart sounds.  Pulmonary:     Effort: Pulmonary effort is normal.     Breath sounds: Normal breath sounds.  Abdominal:     General: Bowel sounds are normal.     Palpations: Abdomen is soft.  Musculoskeletal:     Cervical back: Normal range of motion.  Lymphadenopathy:     Cervical: No cervical adenopathy.  Skin:    General: Skin is warm and dry.     Capillary Refill: Capillary refill takes less than 2 seconds.  Neurological:     Mental Status: She is alert and oriented to person, place, and time.  Psychiatric:        Behavior: Behavior normal.      Musculoskeletal Exam: ***  CDAI Exam: CDAI Score: -- Patient Global: --; Provider Global: -- Swollen: --; Tender: -- Joint Exam 11/26/2023   No joint exam has been documented for this visit   There is currently no information documented on the homunculus. Go to the Rheumatology activity and complete the homunculus joint exam.  Investigation: No additional findings.  Imaging: DG Ankle Complete Right Result Date: 10/24/2023 CLINICAL DATA:  Right  ankle pain after fall. EXAM: RIGHT ANKLE - COMPLETE 3+ VIEW COMPARISON:  None Available. FINDINGS: There is no evidence of fracture, dislocation, or joint effusion. There is no evidence of arthropathy or other focal bone abnormality. Soft tissues are unremarkable. IMPRESSION: Negative. Electronically Signed   By: Lupita Raider M.D.   On: 10/24/2023 15:17   DG Foot Complete Right Result Date: 10/24/2023 CLINICAL DATA:  Right foot pain after fall. EXAM: RIGHT FOOT COMPLETE - 3+ VIEW COMPARISON:  October 11, 2023. FINDINGS: There is no evidence of fracture or dislocation. There is no evidence of arthropathy or other focal bone abnormality. Soft tissues are unremarkable. IMPRESSION: Negative. Electronically Signed   By: Lupita Raider M.D.   On: 10/24/2023 15:16    Recent Labs: Lab Results  Component Value Date   WBC 10.0 11/06/2023   HGB 12.5 11/06/2023   PLT 337 11/06/2023   NA 136 11/06/2023   K 4.4 11/06/2023   CL 108 11/06/2023   CO2 19 (L) 11/06/2023   GLUCOSE 103 (H) 11/06/2023   BUN 6 (L) 11/06/2023   CREATININE 0.73 11/06/2023   BILITOT 0.3 11/06/2023   ALKPHOS 144 (H) 01/15/2023   AST 22 11/06/2023   ALT 27 11/06/2023   PROT 7.7 11/06/2023   ALBUMIN 4.5 01/15/2023   CALCIUM 10.0 11/06/2023   GFRAA 89 07/15/2020    Speciality Comments: No specialty comments available.  Procedures:  No procedures performed Allergies: Patient has no known allergies.   Assessment / Plan:     Visit Diagnoses: Seronegative rheumatoid arthritis (HCC)  High risk medication use  Positive ANA (antinuclear antibody)  Rash  Primary osteoarthritis of both hands  Raynaud's disease without gangrene  Myofascial pain  DDD (degenerative disc disease), cervical  Degeneration of intervertebral disc of lumbar region without discogenic back pain or lower extremity pain  Essential hypertension  Type 2 diabetes mellitus without complication, without long-term current use of insulin  (HCC)  Increased intracranial pressure  AVM (arteriovenous malformation) brain  Hx of migraine headaches  Attention deficit hyperactivity disorder (ADHD), predominantly inattentive type  Gastroesophageal reflux disease with esophagitis without hemorrhage  Anxiety and depression  History of chronic CHF  Vitamin D deficiency  Cerebral venous thrombosis of sigmoid sinus  Other fatigue  Former smoker  Orders: No  orders of the defined types were placed in this encounter.  No orders of the defined types were placed in this encounter.   Face-to-face time spent with patient was *** minutes. Greater than 50% of time was spent in counseling and coordination of care.  Follow-Up Instructions: No follow-ups on file.   Gearldine Bienenstock, PA-C  Note - This record has been created using Dragon software.  Chart creation errors have been sought, but may not always  have been located. Such creation errors do not reflect on  the standard of medical care.

## 2023-11-13 LAB — CBC WITH DIFFERENTIAL/PLATELET
Absolute Lymphocytes: 3200 {cells}/uL (ref 850–3900)
Absolute Monocytes: 530 {cells}/uL (ref 200–950)
Basophils Absolute: 120 {cells}/uL (ref 0–200)
Basophils Relative: 1.2 %
Eosinophils Absolute: 230 {cells}/uL (ref 15–500)
Eosinophils Relative: 2.3 %
HCT: 38.2 % (ref 35.0–45.0)
Hemoglobin: 12.5 g/dL (ref 11.7–15.5)
MCH: 28.1 pg (ref 27.0–33.0)
MCHC: 32.7 g/dL (ref 32.0–36.0)
MCV: 85.8 fL (ref 80.0–100.0)
MPV: 10.3 fL (ref 7.5–12.5)
Monocytes Relative: 5.3 %
Neutro Abs: 5920 {cells}/uL (ref 1500–7800)
Neutrophils Relative %: 59.2 %
Platelets: 337 10*3/uL (ref 140–400)
RBC: 4.45 10*6/uL (ref 3.80–5.10)
RDW: 14.4 % (ref 11.0–15.0)
Total Lymphocyte: 32 %
WBC: 10 10*3/uL (ref 3.8–10.8)

## 2023-11-13 LAB — COMPLETE METABOLIC PANEL WITH GFR
AG Ratio: 1.8 (calc) (ref 1.0–2.5)
ALT: 27 U/L (ref 6–29)
AST: 22 U/L (ref 10–35)
Albumin: 4.9 g/dL (ref 3.6–5.1)
Alkaline phosphatase (APISO): 146 U/L — ABNORMAL HIGH (ref 31–125)
BUN/Creatinine Ratio: 8 (calc) (ref 6–22)
BUN: 6 mg/dL — ABNORMAL LOW (ref 7–25)
CO2: 19 mmol/L — ABNORMAL LOW (ref 20–32)
Calcium: 10 mg/dL (ref 8.6–10.2)
Chloride: 108 mmol/L (ref 98–110)
Creat: 0.73 mg/dL (ref 0.50–0.99)
Globulin: 2.8 g/dL (ref 1.9–3.7)
Glucose, Bld: 103 mg/dL — ABNORMAL HIGH (ref 65–99)
Potassium: 4.4 mmol/L (ref 3.5–5.3)
Sodium: 136 mmol/L (ref 135–146)
Total Bilirubin: 0.3 mg/dL (ref 0.2–1.2)
Total Protein: 7.7 g/dL (ref 6.1–8.1)
eGFR: 103 mL/min/{1.73_m2} (ref >=60)

## 2023-11-13 LAB — ALKALINE PHOSPHATASE ISOENZYMES
Alkaline phosphatase (APISO): 145 U/L — ABNORMAL HIGH (ref 31–125)
Bone Isoenzymes: 39 % (ref 28–66)
Intestinal Isoenzymes: 0 % — ABNORMAL LOW (ref 1–24)
Liver Isoenzymes: 61 % (ref 25–69)
Macrohepatic isoenzymes: 0 % (ref <=0)
Placental isoenzymes: 0 % (ref <=0)

## 2023-11-13 LAB — PROTIME-INR
INR: 1
Prothrombin Time: 10.4 s (ref 9.0–11.5)

## 2023-11-13 LAB — TEST AUTHORIZATION

## 2023-11-13 LAB — ANCA SCREEN W REFLEX TITER: ANCA SCREEN: NEGATIVE

## 2023-11-13 LAB — SEDIMENTATION RATE: Sed Rate: 6 mm/h (ref 0–20)

## 2023-11-14 NOTE — Progress Notes (Unsigned)
 NEUROLOGY FOLLOW UP OFFICE NOTE  Virginia Douglas [id number]  Assessment/Plan:   Migraine without aura, without status migrainosus, not intractable Bilateral sigmoid sinus stenosis s/p stent bilaterally/left transverse sinus stent (March 2023) Left PCA AVM s/p stereotactic surgery - followed at [facility]. History of idiopathic intracranial hypertension    Migraine prevention:  Emgality every 30 days, to further reduce migraine frequency, will increase topiramate to 200mg  at bedtime and have her discontinue acetazolamide.  Check BMP in 2 months to follow up on bicarb. Migraine rescue:  Ubrelvy 100mg , Zofran 4mg  IIH therapy:  Discontinue acetazolamide.  Increase topiramate as instructed above.  Limit use of pain relievers to no more than 2 days out of week to prevent risk of rebound or medication-overuse headache. Keep headache diary Follow up with me 6 months     Subjective:  Virginia Douglas is a 48 year old right-handed woman with cerebellar AVM s/p treatment, diabetes,  and hypertension who follows up for migraines and IIH.   UPDATE: Virginia Douglas.  Effective.  Intensity:  severe  Duration:  severity decreases in 45-60 minutes and will slowly dissipate after a few hours. Frequency:  4 to 6 a month.  Last seen by neurosurgery at [facility] in September.  MRI BRAIN W WO on 05/22/2023 revealed "status post gamma knife treatment of left cerebellar AVM with no significant change in the size of draining vein and small focus of enhancement, the treated nidus.  There is interval near resolution of the mild surrounding edema seen in the prior study."  Plan is to continue monitoring.  She has a repeat scheduled next week.  11/06/2023 LABS:  CMP with Na 136, K 4.4, Cl 108, CO2 19, Ca 10, glucose 103, BUN 6, Cr 0.73, GFR 103, t bili 0.3, ALP 146, AST 22, ALT 27  Current NSAIDS:  none Current analgesics:  hydrocodone (back pain) Other current abortive:  none Current triptans: none Current  ergotamine: None Current anti-emetic: Zofran 4 mg Current muscle relaxants: none Current sleep aide:  none Current Antihypertensive medications: Carvedilol,  Lisinopril-HCTZ Current Antidepressant medications:  Cymbalta 60mg  daily, Wellbutrin Current Anticonvulsant medications: Topiramate 125mg  at bedtime, Lyrica 100mg  three times daily. Current anti-CGRP:  Emgality Current Antihistamines/Decongestants:  Zyrtec Other therapy:  Reyvow (rescue) Hormone/birth control:  no  Other medications:  ASA 81mg , Plavix, atorvastatin, Adderall, clonazepam, Lunesta, Plaquenil For IIH:  acetazolamide 250mg  twice daily  CMP from July normal.   Caffeine: One soda a week Diet: Hydrates Exercise: Yes Depression: Yes; Anxiety: Yes Other pain: Diffuse joint pain, shoulder and back pain.  Has Raynaud's.  ANA positive. Seen by rheumatology.  No specific diagnosis established. Sleep hygiene: Varies   HISTORY: Onset: Remote history of migraines.  Controlled for many years.  Returned in May 2019.Marland Kitchen Location:  Right sided head/ear radiating down right side of neck.  Does not radiate down the right arm. Quality:  Pounding in head, burning in neck Initial intensity:  Severe.  She denies  thunderclap headache or severe headache that wakes her from sleep. Aura:  no Prodrome:  no Postdrome:  no Associated symptoms:  Nausea, vomiting, photophobia, phonophobia, blurred vision.  She denies associated unilateral numbness or weakness. Initial duration:  1 day Initial Frequency:  2 days a week Initial Frequency of abortive medication: 2 days a week Triggers:  Emotional stress Relieving factors:  Heating pad, Robaxin Activity:  Aggravates   She was evaluated in the ED on 02/10/18, where CTA of head and neck was performed and personally reviewed.  It demonstrated  empty sella but otherwise unremarkable for mass lesion, aneurysm or dissection.  She was diagnosed with idiopathic intracranial hypertension diagnosed in 2019.   She is on Diamox 250mg  daily.  She is followed by ophthalmology.  Exam note from 10/10/18 stated "maybe slight nasal nerve elevation" but no definite signs of papilledema.   She has longstanding history of numbness and tingling in the feet.  NCV-EMG of lower extremities in 2017 was normal.  She reports left sided low back pain radiating down the side of the left leg with associated numbness in the side of leg and foot since early 2020  She had a lumbar X-ray which demonstrated mild dextroscoliosis apex L4.  No injury.  She also has endorsed right sided leg pain as well.  MRI of lumbar spine was performed on 05/09/2020 which was personally reviewed and showed mild lumbar spondylosis and degenerative disc disease with very mild bilateral foraminal stenosis at L5-S1.  She followed up with her PCP regarding right knee pain and swelling.  X-ray on 09/29/2020 was negative.  She reports generalized pain and numbness and tingling.  If she holds something, she may drop it.  Labs from 2021 include B12 388,  TSH  0.872, ANA negative, negative dsDNA, sed rate 13, CRP 10, RF negative.  Hgb A1c has increased over 2021 from 5.8 to 6.4.  Gabapentin previously used was ineffective for neuropathic pain.  She had a repeat NCV-EMG of the right upper and lower extremities on 01/04/2021, which was normal.    In June 2022, she woke up one morning and noted the right sided of her face was numb and mouth twisted.  She had difficulty drinking fluids and would drip out the side of her mouth.  She is not sure if she had trouble closing her eye or raising her eyebrow.  She had a slight headache but no facial pain, slurred speech, language difficulty or involvement of arm or leg.  It lasted about a week or a little bit longer. She had an MRI of the brain with and without contrast on 05/19/2021 which showed a small 8 mm enhancing vascular nodule within the left superior cerebellum.  She was referred to endovascular radiology.  Cerebral angiogram  revealed a 3.4 mm x 4.3 mm AVM involving the left posterior cerebral artery P3 segment and high-grade left sigmoid sinus proximally and moderate to severe stenosis of right sigmoid sinus proximally.  She did endorse pulsatile tinnitus in the left ear.  She underwent endovascular revascularization of the high-grade stenosis of the left sigmoid sinus on 06/27/2021.  Headache and pulsatile tinnitus resolved.  MRV of head on 09/26/2021 personally reviewed showed stable and patent stent placement of the distal left transverse sinus and proximal sigmoid sinus stent placement.  She also noted pulsatile tinnitus in her right ear, so she underwent endovascular revascularization of the right transverse sinus sigmoid sinus junction stenosis on 10/28/2021.  Underwent stereotactic surgery of the cerebral AVM at [facility] on 05/01/2022.  Goes to [facility] about every 6 months to follow up on the AVM.  [facility] with and without contrast [facility] 11[facility]02/2022 revealed Status post treatment of the left cerebellar arteriovenous malformation, with  slight decrease in the size of draining vein. The adjacent small enhancing area also appears decreased, likely representing treated nidus. There is mild gliosis/edema surrounding the treated AV malformation.    Past NSAIDS:  Ibuprofen, naproxen Past analgesics:  tramadol 50mg  Past abortive triptans: Sumatriptan 100 mg, rizatriptan 10mg , eletriptan 40mg  Past  muscle relaxants:  Flexeril, Robaxin Past anti-emetic:  no Past antihypertensive medications:  no Past antidepressant medications:  nortriptyline 50mg  (caused nausea/vomiting, problems sleeping), duloxetine Past anticonvulsant medications:  gabapentin 300mg  twice daily (for neuropathic pain) Past CGRP inhibitor:  Ajovy, Aimovig 140mg  (effective/not formulary), Nurtec (effective/not covered) Past vitamins/Herbal/Supplements:  no Past antihistamines/decongestants:  no Other past therapies:  no   Family history of headache:  no   MRI  of cervical spine from 09/22/15 was personally reviewed and was unremarkable.  PAST MEDICAL HISTORY: Past Medical History:  Diagnosis Date   Acute pain of left shoulder 06/05/2019   ADHD (attention deficit hyperactivity disorder)    Anemia    iron deficiency   as a teenager   Anxiety    Arthritis    Chronic combined systolic and diastolic congestive heart failure (HCC) 11/23/2022   This is a new diagnosis with Cardiology, she is scheduled on 2/14 for her MRI of her heart.     Depression    Diabetes (HCC)    Edema    GERD (gastroesophageal reflux disease)    History of congestive heart failure    dx by cardiology   Hypertension    patient denies- patient stated that the PCP said it was for heart prevention.   Left leg pain 04/07/2019   LLQ pain 12/08/2015   Migraine headache    Urinary urgency 09/16/2015    MEDICATIONS: Current Outpatient Medications on File Prior to Visit  Medication Sig Dispense Refill   acetaZOLAMIDE (DIAMOX) 250 MG tablet Take 1 tablet (250 mg total) by mouth 2 (two) times daily. 180 tablet 2   ADDERALL XR 30 MG 24 hr capsule Take 30 mg by mouth daily.     albuterol (VENTOLIN HFA) 108 (90 Base) MCG/ACT inhaler Inhale 2 puffs into the lungs as needed.     aspirin EC 81 MG tablet Take 81 mg by mouth daily. Swallow whole.     atorvastatin (LIPITOR) 10 MG tablet TAKE 1 TABLET BY MOUTH EVERY DAY 90 tablet 1   Blood Pressure Monitoring Soln KIT      buPROPion (WELLBUTRIN XL) 150 MG 24 hr tablet Take 1 tablet (150 mg total) by mouth daily. 90 tablet 1   carvedilol (COREG) 3.125 MG tablet Take 1 tablet (3.125 mg total) by mouth 2 (two) times daily. 180 tablet 3   cetirizine (ZYRTEC) 10 MG tablet Take 10 mg by mouth daily as needed for allergies.     clonazePAM (KLONOPIN) 0.5 MG tablet Take 1 tablet (0.5 mg total) by mouth daily as needed. 30 tablet 2   clopidogrel (PLAVIX) 75 MG tablet Take 1 tablet (75 mg total) by mouth daily. 90 tablet 3   DULoxetine (CYMBALTA)  60 MG capsule TAKE 1 CAPSULE BY MOUTH EVERY DAY 90 capsule 1   empagliflozin (JARDIANCE) 10 MG TABS tablet Take 1 tablet by mouth once daily in the morning 30 tablet 5   eszopiclone (LUNESTA) 2 MG TABS tablet Take 1 tablet (2 mg total) by mouth at bedtime. Take immediately before bedtime 30 tablet 5   famotidine (PEPCID) 20 MG tablet Take 20 mg by mouth daily.     Galcanezumab-gnlm (EMGALITY) 120 MG/ML SOAJ Inject 120 mg into the skin every 30 (thirty) days. 1.12 mL 11   guanFACINE (INTUNIV) 1 MG TB24 ER tablet Take 1 mg by mouth daily.     HYDROcodone-acetaminophen (NORCO) 7.5-325 MG tablet Take 1 tablet by mouth every 12 (twelve) hours as needed for moderate pain (pain score  4-6). Please do not fill until 10/01/23 60 tablet 0   hydroxychloroquine (PLAQUENIL) 200 MG tablet Take 1 tablet (200 mg total) by mouth 2 (two) times daily. 60 tablet 2   insulin aspart (FIASP FLEXTOUCH) 100 UNIT/ML FlexTouch Pen INJECT [place] [facility] INSULIN BEFORE MEALS AND AT HS IF BS < 150 - 0 UNITS, 150-199 - 3 UNITS, 200-249 - 6 UNITS, 250-299 - 9 UNITS, 300-349 - 12 UNITS, >350 - 15 UNITS. IF >400 CALL OUR OFFICE WITH YOUR BLOOD SUGAR READINGS. 15 mL 1   Insulin Pen Needle (PEN NEEDLES) 32G X 4 MM MISC 1 each by Does not apply route once a week. 30 each 3   Lancets (ONETOUCH DELICA PLUS LANCET33G) MISC Apply 1 each topically 3 (three) times daily.     LINZESS 72 MCG capsule TAKE 1 CAPSULE BY MOUTH DAILY BEFORE BREAKFAST. 30 capsule 5   phentermine (ADIPEX-P) 37.5 MG tablet Take 1 tablet (37.5 mg total) by mouth daily before breakfast. 30 tablet 1   Polyethyl Glycol-Propyl Glycol (SYSTANE) 0.4-0.3 % SOLN Place 1 drop into both eyes daily as needed (dry eyes).     sacubitril-valsartan (ENTRESTO) 24-26 MG Take 1 tablet by mouth 2 (two) times daily. 180 tablet 3   spironolactone (ALDACTONE) 25 MG tablet Take 0.5 tablets (12.5 mg total) by mouth daily. 45 tablet 3   tirzepatide (MOUNJARO) 10 MG/0.5ML Pen Inject 10 mg into the skin once a  week. 6 mL 1   topiramate (TOPAMAX) 50 MG tablet TAKE 2 AND 1/2 TABLETS (125 MG TOTAL) BY MOUTH AT BEDTIME. Strength: 50 mg 225 tablet 2   Vitamin D, Ergocalciferol, (DRISDOL) 1.25 MG (50000 UNIT) CAPS capsule TAKE 1 CAPSULE BY MOUTH EVERY 7 DAYS **NOT COVERED** 12 capsule 0   [DISCONTINUED] metFORMIN (GLUCOPHAGE-XR) 500 MG 24 hr tablet Take 2 tablets (1,000 mg total) by mouth 2 (two) times daily. (Patient not taking: Reported on 06/05/2019) 30 tablet 0   [DISCONTINUED] Phentermine-Topiramate 11.25-69 MG CP24 Take 1 tablet by mouth daily. 30 capsule 1   No current facility-administered medications on file prior to visit.    ALLERGIES: No Known Allergies  FAMILY HISTORY: Family History  Problem Relation Age of Onset   Diabetes Mother    Hypertension Mother    Diabetes Father    Hypertension Father    Kidney failure Father    Anxiety disorder Father    Hypertension Brother    Cancer Maternal Grandmother    Diabetes Maternal Grandmother    Pancreatic cancer Maternal Grandmother    Arthritis Maternal Grandmother    Diabetes Paternal Grandmother    Breast cancer Paternal Grandmother    Stroke Paternal Grandfather    Healthy Son    ADD / ADHD Son    Healthy Son    Asthma Son    Cancer Maternal Aunt        breast   Breast cancer Maternal Aunt    Birth defects Maternal Aunt       Objective:  Blood pressure 117/76, pulse 70, resp. rate 20, height 5\' 6"  (1.676 m), weight 204 lb (92.5 kg), SpO2 99%. General: No acute distress.  Patient appears well-groomed.       Shon Millet, DO  CC: Arnette Felts, FNP

## 2023-11-14 NOTE — Progress Notes (Signed)
ANCA screen negative

## 2023-11-15 ENCOUNTER — Encounter: Payer: Self-pay | Admitting: Pharmacist

## 2023-11-15 ENCOUNTER — Encounter: Payer: Self-pay | Admitting: Neurology

## 2023-11-15 ENCOUNTER — Telehealth: Payer: Self-pay

## 2023-11-15 ENCOUNTER — Ambulatory Visit (INDEPENDENT_AMBULATORY_CARE_PROVIDER_SITE_OTHER): Payer: No Typology Code available for payment source | Admitting: Neurology

## 2023-11-15 VITALS — BP 117/76 | HR 70 | Resp 20 | Ht 66.0 in | Wt 204.0 lb

## 2023-11-15 DIAGNOSIS — G932 Benign intracranial hypertension: Secondary | ICD-10-CM | POA: Diagnosis not present

## 2023-11-15 DIAGNOSIS — Z79899 Other long term (current) drug therapy: Secondary | ICD-10-CM | POA: Diagnosis not present

## 2023-11-15 DIAGNOSIS — G43009 Migraine without aura, not intractable, without status migrainosus: Secondary | ICD-10-CM

## 2023-11-15 DIAGNOSIS — Q282 Arteriovenous malformation of cerebral vessels: Secondary | ICD-10-CM | POA: Diagnosis not present

## 2023-11-15 MED ORDER — UBRELVY 100 MG PO TABS: 1.0000 | ORAL_TABLET | ORAL | 5 refills | Status: DC | PRN

## 2023-11-15 MED ORDER — ONDANSETRON HCL 4 MG PO TABS: 4.0000 mg | ORAL_TABLET | Freq: Three times a day (TID) | ORAL | 5 refills | Status: AC | PRN

## 2023-11-15 MED ORDER — TOPIRAMATE 100 MG PO TABS: 200.0000 mg | ORAL_TABLET | Freq: Every day | ORAL | 5 refills | Status: DC

## 2023-11-15 NOTE — Patient Instructions (Addendum)
 Stop acetazolamide.  Increase topiramate to 200mg  at bedtime.  Repeat BMP in 2 months. Continue Emgality every 4 weeks Take Ubrelvy at earliest onset of migraine.  May repeat after 2 hours if needed.  Maximum 2 tablets in 24 hours.  Take ondansetron for nausea Follow up 6 months.

## 2023-11-15 NOTE — Telephone Encounter (Signed)
 Error

## 2023-11-15 NOTE — Telephone Encounter (Signed)
 PA needed for Ubrelvy.

## 2023-11-26 ENCOUNTER — Ambulatory Visit: Payer: No Typology Code available for payment source | Admitting: Physician Assistant

## 2023-11-26 DIAGNOSIS — Z8679 Personal history of other diseases of the circulatory system: Secondary | ICD-10-CM

## 2023-11-26 DIAGNOSIS — R768 Other specified abnormal immunological findings in serum: Secondary | ICD-10-CM

## 2023-11-26 DIAGNOSIS — M19041 Primary osteoarthritis, right hand: Secondary | ICD-10-CM

## 2023-11-26 DIAGNOSIS — K2100 Gastro-esophageal reflux disease with esophagitis, without bleeding: Secondary | ICD-10-CM

## 2023-11-26 DIAGNOSIS — I10 Essential (primary) hypertension: Secondary | ICD-10-CM

## 2023-11-26 DIAGNOSIS — G08 Intracranial and intraspinal phlebitis and thrombophlebitis: Secondary | ICD-10-CM

## 2023-11-26 DIAGNOSIS — Z79899 Other long term (current) drug therapy: Secondary | ICD-10-CM

## 2023-11-26 DIAGNOSIS — Z8669 Personal history of other diseases of the nervous system and sense organs: Secondary | ICD-10-CM

## 2023-11-26 DIAGNOSIS — Q282 Arteriovenous malformation of cerebral vessels: Secondary | ICD-10-CM

## 2023-11-26 DIAGNOSIS — Z87891 Personal history of nicotine dependence: Secondary | ICD-10-CM

## 2023-11-26 DIAGNOSIS — F900 Attention-deficit hyperactivity disorder, predominantly inattentive type: Secondary | ICD-10-CM

## 2023-11-26 DIAGNOSIS — M7918 Myalgia, other site: Secondary | ICD-10-CM

## 2023-11-26 DIAGNOSIS — M0600 Rheumatoid arthritis without rheumatoid factor, unspecified site: Secondary | ICD-10-CM

## 2023-11-26 DIAGNOSIS — E119 Type 2 diabetes mellitus without complications: Secondary | ICD-10-CM

## 2023-11-26 DIAGNOSIS — F32A Depression, unspecified: Secondary | ICD-10-CM

## 2023-11-26 DIAGNOSIS — M5030 Other cervical disc degeneration, unspecified cervical region: Secondary | ICD-10-CM

## 2023-11-26 DIAGNOSIS — E559 Vitamin D deficiency, unspecified: Secondary | ICD-10-CM

## 2023-11-26 DIAGNOSIS — R5383 Other fatigue: Secondary | ICD-10-CM

## 2023-11-26 DIAGNOSIS — G932 Benign intracranial hypertension: Secondary | ICD-10-CM

## 2023-11-26 DIAGNOSIS — I7300 Raynaud's syndrome without gangrene: Secondary | ICD-10-CM

## 2023-11-26 DIAGNOSIS — M51369 Other intervertebral disc degeneration, lumbar region without mention of lumbar back pain or lower extremity pain: Secondary | ICD-10-CM

## 2023-11-26 DIAGNOSIS — R21 Rash and other nonspecific skin eruption: Secondary | ICD-10-CM

## 2023-11-27 ENCOUNTER — Other Ambulatory Visit (HOSPITAL_COMMUNITY): Payer: Self-pay

## 2023-12-04 NOTE — Progress Notes (Deleted)
 Office Visit Note  Patient: Virginia Douglas             Date of Birth: [birth year]           MRN: [id number]             PCP: Arnette Felts, FNP Referring: Arnette Felts, FNP Visit Date: 12/07/2023 Occupation: @GUAROCC @  Subjective:  No chief complaint on file.   History of Present Illness: Virginia Douglas is a 48 y.o. female ***     Activities of Daily Living:  Patient reports morning stiffness for *** {minute/hour:19697}.   Patient {ACTIONS;DENIES/REPORTS:21021675::"Denies"} nocturnal pain.  Difficulty dressing/grooming: {ACTIONS;DENIES/REPORTS:21021675::"Denies"} Difficulty climbing stairs: {ACTIONS;DENIES/REPORTS:21021675::"Denies"} Difficulty getting out of chair: {ACTIONS;DENIES/REPORTS:21021675::"Denies"} Difficulty using hands for taps, buttons, cutlery, and/or writing: {ACTIONS;DENIES/REPORTS:21021675::"Denies"}  No Rheumatology ROS completed.   PMFS History:  Patient Active Problem List   Diagnosis Date Noted   Influenza vaccination declined 11/02/2023   COVID-19 vaccine administered 11/02/2023   Need for pneumococcal vaccination 11/02/2023   Class 1 obesity due to excess calories with body mass index (BMI) of 32.0 to 32.9 in adult 11/02/2023   Fall 10/18/2023   Acute left ankle pain 10/18/2023   HFrEF (heart failure with reduced ejection fraction) (HCC) 11/09/2022   Type 2 diabetes mellitus with obesity (HCC) 11/08/2022   DKA (diabetic ketoacidosis) (HCC) 10/13/2022   Cerebral venous thrombosis of sigmoid sinus 11/21/2021   Pulsatile tinnitus of right ear 11/21/2021   Pulsatile tinnitus, bilateral 06/27/2021   AVM (arteriovenous malformation) brain 05/31/2021   DDD (degenerative disc disease), cervical 12/06/2020   DDD (degenerative disc disease), lumbar 12/06/2020   Gastroesophageal reflux disease with esophagitis without hemorrhage 12/06/2020   Increased intracranial pressure 12/06/2020   PTSD (post-traumatic stress disorder) 12/06/2020    Attention deficit hyperactivity disorder (ADHD), predominantly inattentive type 12/06/2020   Vitamin D deficiency 12/06/2020   Obesity (BMI 30-39.9) 03/15/2020   Sciatic nerve pain 12/10/2019   Grief 06/05/2019   Migraine 06/05/2019   Depression 04/07/2019   Obesity due to excess calories without serious comorbidity 04/07/2019   Anxiety 04/07/2019   Headache 07/06/2018   Benign hypertensive heart disease with congestive heart failure and with combined systolic and diastolic dysfunction (HCC) 12/08/2015   Type 2 diabetes mellitus with obesity (HCC) 12/08/2015   Paresthesia 09/16/2015    Past Medical History:  Diagnosis Date   Acute pain of left shoulder 06/05/2019   ADHD (attention deficit hyperactivity disorder)    Anemia    iron deficiency   as a teenager   Anxiety    Arthritis    Chronic combined systolic and diastolic congestive heart failure (HCC) 11/23/2022   This is a new diagnosis with Cardiology, she is scheduled on 2/14 for her MRI of her heart.     Depression    Diabetes (HCC)    Edema    GERD (gastroesophageal reflux disease)    History of congestive heart failure    dx by cardiology   Hypertension    patient denies- patient stated that the PCP said it was for heart prevention.   Left leg pain 04/07/2019   LLQ pain 12/08/2015   Migraine headache    Urinary urgency 09/16/2015    Family History  Problem Relation Age of Onset   Diabetes Mother    Hypertension Mother    Diabetes Father    Hypertension Father    Kidney failure Father    Anxiety disorder Father    Hypertension Brother    Cancer Maternal Grandmother  Diabetes Maternal Grandmother    Pancreatic cancer Maternal Grandmother    Arthritis Maternal Grandmother    Diabetes Paternal Grandmother    Breast cancer Paternal Grandmother    Stroke Paternal Grandfather    Healthy Son    ADD / ADHD Son    Healthy Son    Asthma Son    Cancer Maternal Aunt        breast   Breast cancer Maternal Aunt     Birth defects Maternal Aunt    Past Surgical History:  Procedure Laterality Date   ABDOMINAL HYSTERECTOMY  2012   CHOLECYSTECTOMY     COLONOSCOPY  12/28/2020   polyps   ESOPHAGEAL DILATION     gamma knife     04/2022   IR ANGIO INTRA EXTRACRAN SEL COM CAROTID INNOMINATE BILAT MOD SED  05/31/2021   IR ANGIO INTRA EXTRACRAN SEL COM CAROTID INNOMINATE BILAT MOD SED  06/27/2021   IR ANGIO INTRA EXTRACRAN SEL COM CAROTID INNOMINATE UNI R MOD SED  11/21/2021   IR ANGIO VERTEBRAL SEL VERTEBRAL UNI R MOD SED  05/31/2021   IR CT HEAD LTD  06/27/2021   IR CT HEAD LTD  11/21/2021   IR INTRA CRAN STENT  06/27/2021   IR INTRA CRAN STENT  11/21/2021   IR RADIOLOGIST EVAL & MGMT  06/03/2021   IR RADIOLOGIST EVAL & MGMT  07/13/2021   IR RADIOLOGIST EVAL & MGMT  08/27/2021   IR RADIOLOGIST EVAL & MGMT  10/30/2021   IR RADIOLOGIST EVAL & MGMT  12/06/2021   IR US GUIDE VASC ACCESS RIGHT  05/31/2021   IR US GUIDE VASC ACCESS RIGHT  06/27/2021   IR US GUIDE VASC ACCESS RIGHT  11/21/2021   OVARIAN CYST REMOVAL     RADIOLOGY WITH ANESTHESIA N/A 06/27/2021   Procedure: STENTING;  Surgeon: Julieanne Cotton, MD;  Location: [facility] OR;  Service: Radiology;  Laterality: N/A;   RADIOLOGY WITH ANESTHESIA N/A 11/21/2021   Procedure: IR WITH ANESTHESIA STENTING;  Surgeon: Julieanne Cotton, MD;  Location: [facility] OR;  Service: Radiology;  Laterality: N/A;   Social History   Social History Narrative   In relationship, 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 She lives with her 2 children in a one story house. She drinks one large cup of coffee a day and an occasional tea or soda. She walks daily.      One story home      Immunization History  Administered Date(s) Administered   HPV 9-valent 04/11/2019   Influenza Inj Mdck Quad Pf 05/18/2018   Influenza,inj,Quad PF,6+ Mos  05/20/2017, 05/14/2019, 06/02/2020, 05/09/2022   Influenza-Unspecified 05/11/2017, 05/15/2018, 05/08/2019, 07/13/2021   Janssen (J&J) SARS-COV-2 Vaccination 11/23/2019   PFIZER(Purple Top)SARS-COV-2 Vaccination 07/11/2020   PNEUMOCOCCAL CONJUGATE-20 10/18/2023   Pfizer(Comirnaty)Fall Seasonal Vaccine 12 years and older 01/15/2023, 10/18/2023   Pneumococcal Polysaccharide-23 04/08/2019, 07/15/2019   Tdap 07/02/2017, 04/09/2019     Objective: Vital Signs: There were no vitals taken for this visit.   Physical Exam   Musculoskeletal Exam: ***  CDAI Exam: CDAI Score: -- Patient Global: --; Provider Global: -- Swollen: --; Tender: -- Joint Exam 12/07/2023   No joint exam has been documented for this visit   There is currently no information documented on the homunculus. Go to the Rheumatology activity and complete the homunculus joint exam.  Investigation: No additional findings.  Imaging: No results found.  Recent Labs: Lab Results  Component Value Date   WBC 10.0 11/06/2023   HGB 12.5 11/06/2023   PLT 337 11/06/2023   NA 136 11/06/2023   K 4.4 11/06/2023   CL 108 11/06/2023   CO2 19 (L) 11/06/2023   GLUCOSE 103 (H) 11/06/2023   BUN 6 (L) 11/06/2023   CREATININE 0.73 11/06/2023   BILITOT 0.3 11/06/2023   ALKPHOS 144 (H) 01/15/2023   AST 22 11/06/2023   ALT 27 11/06/2023   PROT 7.7 11/06/2023   ALBUMIN 4.5 01/15/2023   CALCIUM 10.0 11/06/2023   GFRAA 89 07/15/2020    Speciality Comments: No specialty comments available.  Procedures:  No procedures performed Allergies: Patient has no known allergies.   Assessment / Plan:     Visit Diagnoses: Seronegative rheumatoid arthritis (HCC)  High risk medication use  Positive ANA (antinuclear antibody)  Primary osteoarthritis of both hands  Raynaud's disease without gangrene  Myofascial pain  Chronic pain of both knees  DDD (degenerative disc disease), cervical  Degeneration of intervertebral disc of  lumbar region without discogenic back pain or lower extremity pain  Essential hypertension  Type 2 diabetes mellitus without complication, without long-term current use of insulin (HCC)  Rash  Increased intracranial pressure  AVM (arteriovenous malformation) brain  Hx of migraine headaches  Paresthesia  Attention deficit hyperactivity disorder (ADHD), predominantly inattentive type  Gastroesophageal reflux disease with esophagitis without hemorrhage  PTSD (post-traumatic stress disorder)  Anxiety and depression  Former smoker  History of chronic CHF  Vitamin D deficiency  Other fatigue  Cerebral venous thrombosis of sigmoid sinus  Orders: No orders of the defined types were placed in this encounter.  No orders of the defined types were placed in this encounter.   Face-to-face time spent with patient was *** minutes. Greater than 50% of time was spent in counseling and coordination of care.  Follow-Up Instructions: No follow-ups on file.   Gearldine Bienenstock, PA-C  Note - This record has been created using Dragon software.  Chart creation errors have been sought, but may not always  have been located. Such creation errors do not reflect on  the standard of medical care.

## 2023-12-06 ENCOUNTER — Encounter
Payer: No Typology Code available for payment source | Attending: Physical Medicine & Rehabilitation | Admitting: Physical Medicine & Rehabilitation

## 2023-12-06 DIAGNOSIS — M797 Fibromyalgia: Secondary | ICD-10-CM | POA: Insufficient documentation

## 2023-12-06 DIAGNOSIS — G894 Chronic pain syndrome: Secondary | ICD-10-CM | POA: Insufficient documentation

## 2023-12-06 DIAGNOSIS — M545 Low back pain, unspecified: Secondary | ICD-10-CM | POA: Insufficient documentation

## 2023-12-06 DIAGNOSIS — M0520 Rheumatoid vasculitis with rheumatoid arthritis of unspecified site: Secondary | ICD-10-CM | POA: Insufficient documentation

## 2023-12-06 DIAGNOSIS — G8929 Other chronic pain: Secondary | ICD-10-CM | POA: Insufficient documentation

## 2023-12-06 DIAGNOSIS — Z79891 Long term (current) use of opiate analgesic: Secondary | ICD-10-CM | POA: Insufficient documentation

## 2023-12-06 DIAGNOSIS — Z5181 Encounter for therapeutic drug level monitoring: Secondary | ICD-10-CM | POA: Insufficient documentation

## 2023-12-07 ENCOUNTER — Ambulatory Visit: Admitting: Physician Assistant

## 2023-12-07 DIAGNOSIS — G932 Benign intracranial hypertension: Secondary | ICD-10-CM

## 2023-12-07 DIAGNOSIS — G8929 Other chronic pain: Secondary | ICD-10-CM

## 2023-12-07 DIAGNOSIS — M19041 Primary osteoarthritis, right hand: Secondary | ICD-10-CM

## 2023-12-07 DIAGNOSIS — F32A Depression, unspecified: Secondary | ICD-10-CM

## 2023-12-07 DIAGNOSIS — I10 Essential (primary) hypertension: Secondary | ICD-10-CM

## 2023-12-07 DIAGNOSIS — F4310 Post-traumatic stress disorder, unspecified: Secondary | ICD-10-CM

## 2023-12-07 DIAGNOSIS — R5383 Other fatigue: Secondary | ICD-10-CM

## 2023-12-07 DIAGNOSIS — Z8669 Personal history of other diseases of the nervous system and sense organs: Secondary | ICD-10-CM

## 2023-12-07 DIAGNOSIS — M51369 Other intervertebral disc degeneration, lumbar region without mention of lumbar back pain or lower extremity pain: Secondary | ICD-10-CM

## 2023-12-07 DIAGNOSIS — Z8679 Personal history of other diseases of the circulatory system: Secondary | ICD-10-CM

## 2023-12-07 DIAGNOSIS — Z79899 Other long term (current) drug therapy: Secondary | ICD-10-CM

## 2023-12-07 DIAGNOSIS — M7918 Myalgia, other site: Secondary | ICD-10-CM

## 2023-12-07 DIAGNOSIS — K2100 Gastro-esophageal reflux disease with esophagitis, without bleeding: Secondary | ICD-10-CM

## 2023-12-07 DIAGNOSIS — G08 Intracranial and intraspinal phlebitis and thrombophlebitis: Secondary | ICD-10-CM

## 2023-12-07 DIAGNOSIS — I7300 Raynaud's syndrome without gangrene: Secondary | ICD-10-CM

## 2023-12-07 DIAGNOSIS — E119 Type 2 diabetes mellitus without complications: Secondary | ICD-10-CM

## 2023-12-07 DIAGNOSIS — E559 Vitamin D deficiency, unspecified: Secondary | ICD-10-CM

## 2023-12-07 DIAGNOSIS — R768 Other specified abnormal immunological findings in serum: Secondary | ICD-10-CM

## 2023-12-07 DIAGNOSIS — M5030 Other cervical disc degeneration, unspecified cervical region: Secondary | ICD-10-CM

## 2023-12-07 DIAGNOSIS — M0600 Rheumatoid arthritis without rheumatoid factor, unspecified site: Secondary | ICD-10-CM

## 2023-12-07 DIAGNOSIS — F900 Attention-deficit hyperactivity disorder, predominantly inattentive type: Secondary | ICD-10-CM

## 2023-12-07 DIAGNOSIS — Q282 Arteriovenous malformation of cerebral vessels: Secondary | ICD-10-CM

## 2023-12-07 DIAGNOSIS — R202 Paresthesia of skin: Secondary | ICD-10-CM

## 2023-12-07 DIAGNOSIS — Z87891 Personal history of nicotine dependence: Secondary | ICD-10-CM

## 2023-12-07 DIAGNOSIS — R21 Rash and other nonspecific skin eruption: Secondary | ICD-10-CM

## 2023-12-07 NOTE — Progress Notes (Signed)
 Office Visit Note  Patient: Virginia Douglas             Date of Birth: Oct 27, 1975           MRN: [id number]             PCP: Arnette Felts, FNP Referring: Arnette Felts, FNP Visit Date: 12/12/2023 Occupation: @GUAROCC @  Subjective:  Pain in multiple joints   History of Present Illness: Virginia Douglas is a 48 y.o. female with history of seronegative rheumatoid arthritis and osteoarthritis.  Patient remains on plaquenil 200 mg 1 tablet by mouth twice daily.  She was started on Plaquenil after her last office visit on 10/11/2023.  She has been tolerating Plaquenil without any side effects but continues to have pain involving multiple joints.  She is been experiencing increased pain and stiffness involving both shoulders, both hands, both knees, and both feet.  She has noticed increased inflammation in her hands and feet recently.  Last week she missed several days of work due to the severity of symptoms.  She has been taking Tylenol as needed for pain relief as well as using hemp products.   Activities of Daily Living:  Patient reports morning stiffness for 1 or more hours.   Patient Reports nocturnal pain.  Difficulty dressing/grooming: Reports Difficulty climbing stairs: Reports Difficulty getting out of chair: Reports Difficulty using hands for taps, buttons, cutlery, and/or writing: Reports  Review of Systems  Constitutional:  Positive for fatigue.  HENT:  Positive for mouth sores and mouth dryness. Negative for nose dryness.   Eyes:  Positive for dryness. Negative for pain and visual disturbance.  Respiratory:  Positive for shortness of breath. Negative for cough, hemoptysis and difficulty breathing.   Cardiovascular:  Positive for palpitations. Negative for hypertension and swelling in legs/feet.  Gastrointestinal:  Positive for constipation. Negative for blood in stool and diarrhea.  Endocrine: Positive for increased urination.  Genitourinary:  Positive for  involuntary urination. Negative for painful urination.  Musculoskeletal:  Positive for joint pain, gait problem, joint pain, joint swelling, myalgias, muscle weakness, morning stiffness, muscle tenderness and myalgias.  Skin:  Positive for color change, rash, hair loss and sensitivity to sunlight. Negative for pallor, nodules/bumps, skin tightness and ulcers.  Allergic/Immunologic: Positive for susceptible to infections.  Neurological:  Positive for dizziness and headaches. Negative for numbness and weakness.  Hematological:  Positive for swollen glands.  Psychiatric/Behavioral:  Positive for depressed mood and sleep disturbance. The patient is nervous/anxious.     PMFS History:  Patient Active Problem List   Diagnosis Date Noted   Influenza vaccination declined 11/02/2023   COVID-19 vaccine administered 11/02/2023   Need for pneumococcal vaccination 11/02/2023   Class 1 obesity due to excess calories with body mass index (BMI) of 32.0 to 32.9 in adult 11/02/2023   Fall 10/18/2023   Acute left ankle pain 10/18/2023   HFrEF (heart failure with reduced ejection fraction) (HCC) 11/09/2022   Type 2 diabetes mellitus with obesity (HCC) 11/08/2022   DKA (diabetic ketoacidosis) (HCC) 10/13/2022   Cerebral venous thrombosis of sigmoid sinus 11/21/2021   Pulsatile tinnitus of right ear 11/21/2021   Pulsatile tinnitus, bilateral 06/27/2021   AVM (arteriovenous malformation) brain 05/31/2021   DDD (degenerative disc disease), cervical 12/06/2020   DDD (degenerative disc disease), lumbar 12/06/2020   Gastroesophageal reflux disease with esophagitis without hemorrhage 12/06/2020   Increased intracranial pressure 12/06/2020   PTSD (post-traumatic stress disorder) 12/06/2020   Attention deficit hyperactivity disorder (ADHD), predominantly inattentive type  12/06/2020   Vitamin D deficiency 12/06/2020   Obesity (BMI 30-39.9) 03/15/2020   Sciatic nerve pain 12/10/2019   Grief 06/05/2019   Migraine  06/05/2019   Depression 04/07/2019   Obesity due to excess calories without serious comorbidity 04/07/2019   Anxiety 04/07/2019   Headache 07/06/2018   Benign hypertensive heart disease with congestive heart failure and with combined systolic and diastolic dysfunction (HCC) 12/08/2015   Type 2 diabetes mellitus with obesity (HCC) 12/08/2015   Paresthesia 09/16/2015    Past Medical History:  Diagnosis Date   Acute pain of left shoulder 06/05/2019   ADHD (attention deficit hyperactivity disorder)    Anemia    iron deficiency   as a teenager   Anxiety    Arthritis    Chronic combined systolic and diastolic congestive heart failure (HCC) 11/23/2022   This is a new diagnosis with Cardiology, she is scheduled on 2/14 for her MRI of her heart.     Depression    Diabetes (HCC)    Edema    GERD (gastroesophageal reflux disease)    History of congestive heart failure    dx by cardiology   Hypertension    patient denies- patient stated that the PCP said it was for heart prevention.   Left leg pain 04/07/2019   LLQ pain 12/08/2015   Migraine headache    Urinary urgency 09/16/2015    Family History  Problem Relation Age of Onset   Diabetes Mother    Hypertension Mother    Diabetes Father    Hypertension Father    Kidney failure Father    Anxiety disorder Father    Hypertension Brother    Cancer Maternal Grandmother    Diabetes Maternal Grandmother    Pancreatic cancer Maternal Grandmother    Arthritis Maternal Grandmother    Diabetes Paternal Grandmother    Breast cancer Paternal Grandmother    Stroke Paternal Grandfather    Healthy Son    ADD / ADHD Son    Healthy Son    Asthma Son    Cancer Maternal Aunt        breast   Breast cancer Maternal Aunt    Birth defects Maternal Aunt    Past Surgical History:  Procedure Laterality Date   ABDOMINAL HYSTERECTOMY  2012   CHOLECYSTECTOMY     COLONOSCOPY  12/28/2020   polyps   ESOPHAGEAL DILATION     gamma knife     04/2022    IR ANGIO INTRA EXTRACRAN SEL COM CAROTID INNOMINATE BILAT MOD SED  05/31/2021   IR ANGIO INTRA EXTRACRAN SEL COM CAROTID INNOMINATE BILAT MOD SED  06/27/2021   IR ANGIO INTRA EXTRACRAN SEL COM CAROTID INNOMINATE UNI R MOD SED  11/21/2021   IR ANGIO VERTEBRAL SEL VERTEBRAL UNI R MOD SED  05/31/2021   IR CT HEAD LTD  06/27/2021   IR CT HEAD LTD  11/21/2021   IR INTRA CRAN STENT  06/27/2021   IR INTRA CRAN STENT  11/21/2021   IR RADIOLOGIST EVAL & MGMT  06/03/2021   IR RADIOLOGIST EVAL & MGMT  07/13/2021   IR RADIOLOGIST EVAL & MGMT  08/27/2021   IR RADIOLOGIST EVAL & MGMT  10/30/2021   IR RADIOLOGIST EVAL & MGMT  12/06/2021   IR US GUIDE VASC ACCESS RIGHT  05/31/2021   IR US GUIDE VASC ACCESS RIGHT  06/27/2021   IR US GUIDE VASC ACCESS RIGHT  11/21/2021   OVARIAN CYST REMOVAL     RADIOLOGY WITH ANESTHESIA  N/A 06/27/2021   Procedure: STENTING;  Surgeon: Julieanne Cotton, MD;  Location: [facility] OR;  Service: Radiology;  Laterality: N/A;   RADIOLOGY WITH ANESTHESIA N/A 11/21/2021   Procedure: IR WITH ANESTHESIA STENTING;  Surgeon: Julieanne Cotton, MD;  Location: [facility] OR;  Service: Radiology;  Laterality: N/A;   Social History   Social History Narrative   In relationship, 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 She lives with her 2 children in a one story house. She drinks one large cup of coffee a day and an occasional tea or soda. She walks daily.      One story home      Immunization History  Administered Date(s) Administered   HPV 9-valent 04/11/2019   Influenza Inj Mdck Quad Pf 05/18/2018   Influenza,inj,Quad PF,6+ Mos 05/20/2017, 05/14/2019, 06/02/2020, 05/09/2022   Influenza-Unspecified 05/11/2017, 05/15/2018, 05/08/2019, 07/13/2021   Janssen (J&J) SARS-COV-2 Vaccination 11/23/2019   PFIZER(Purple Top)SARS-COV-2 Vaccination 07/11/2020    PNEUMOCOCCAL CONJUGATE-20 10/18/2023   Pfizer(Comirnaty)Fall Seasonal Vaccine 12 years and older 01/15/2023, 10/18/2023   Pneumococcal Polysaccharide-23 04/08/2019, 07/15/2019   Tdap 07/02/2017, 04/09/2019     Objective: Vital Signs: BP 101/68 (BP Location: Left Arm, Patient Position: Sitting, Cuff Size: Large)   Pulse 87   Resp 14   Ht 5\' 6"  (1.676 m)   Wt 201 lb (91.2 kg)   BMI 32.44 kg/m    Physical Exam Vitals and nursing note reviewed.  Constitutional:      Appearance: She is well-developed.  HENT:     Head: Normocephalic and atraumatic.  Eyes:     Conjunctiva/sclera: Conjunctivae normal.  Cardiovascular:     Rate and Rhythm: Normal rate and regular rhythm.     Heart sounds: Normal heart sounds.  Pulmonary:     Effort: Pulmonary effort is normal.     Breath sounds: Normal breath sounds.  Abdominal:     General: Bowel sounds are normal.     Palpations: Abdomen is soft.  Musculoskeletal:     Cervical back: Normal range of motion.  Lymphadenopathy:     Cervical: No cervical adenopathy.  Skin:    General: Skin is warm and dry.     Capillary Refill: Capillary refill takes less than 2 seconds.  Neurological:     Mental Status: She is alert and oriented to person, place, and time.  Psychiatric:        Behavior: Behavior normal.      Musculoskeletal Exam: C-spine has painful limited lateral rotation.  Limited mobility of the lumbar spine with discomfort.  Shoulder joints have limited abduction to about 90 degrees bilaterally.  Tenderness of both wrist joints and all MCP joints.  Complete fist formation noted.  Painful range of motion of both hips.  Discomfort range of motion of both knee joints with mild crepitus.  No knee joint effusion noted.  Tenderness of both ankle joints.  Tenderness of MTP joints.  CDAI Exam: CDAI Score: -- Patient Global: --; Provider Global: -- Swollen: --; Tender: -- Joint Exam 12/12/2023   No joint exam has been documented for this  visit   There is currently no information documented on the homunculus. Go to the Rheumatology activity and complete the homunculus joint exam.  Investigation: No additional findings.  Imaging: No results found.  Recent Labs: Lab Results  Component Value Date  WBC 10.0 11/06/2023   HGB 12.5 11/06/2023   PLT 337 11/06/2023   NA 136 11/06/2023   K 4.4 11/06/2023   CL 108 11/06/2023   CO2 19 (L) 11/06/2023   GLUCOSE 103 (H) 11/06/2023   BUN 6 (L) 11/06/2023   CREATININE 0.73 11/06/2023   BILITOT 0.3 11/06/2023   ALKPHOS 144 (H) 01/15/2023   AST 22 11/06/2023   ALT 27 11/06/2023   PROT 7.7 11/06/2023   ALBUMIN 4.5 01/15/2023   CALCIUM 10.0 11/06/2023   GFRAA 89 07/15/2020    Speciality Comments: No specialty comments available.  Procedures:  No procedures performed Allergies: Patient has no known allergies.   Assessment / Plan:     Visit Diagnoses: Seronegative rheumatoid arthritis (HCC) - 10/03/23: RF-, Anti-CCP-, 14-3-3 eta-, ESR WNL, CRP WNL.  U/s 09/3023: positive for synovitis: Patient presents today with ongoing pain and stiffness involving multiple joints.  She has been experiencing increased discomfort in both shoulders, both hands, both knees, and both feet.  She has noticed generalized joint stiffness and inflammation.  She has been taking Plaquenil 200 mg 1 tablet by mouth twice daily since the prescription was sent in on 10/16/2023.  She has been tolerating Plaquenil without any side effects but has not yet noticed full clinical benefit.  Last week she had to miss work due to severity of symptoms.  Patient plans on applying for intermittent FMLA.  She is open to discussing other treatment options since Tylenol has not been helping with breakthrough symptoms.  Indications, contraindications, and potential side effects of methotrexate were discussed today in detail.  All questions were addressed and consent was obtained.  Plan to initiate methotrexate 6 tablets by mouth  once weekly and folic acid 2 mg daily pending lab results.  She will require updated lab work 2 weeks, then 2 months, then every 3 months after initiating methotrexate.  She is vies notify us if she cannot tolerate taking methotrexate.  She will remain on Plaquenil as combination therapy.  She will follow-up in the office in 8 weeks to assess her response.  Drug Counseling TB Gold: Pending Hepatitis panel: Pending  Chest-xray: No active cardiopulmonary disease on 10/13/2022.  Contraception: Hysterectomy   Alcohol use: Discussed the importance of avoiding alcohol use.  Patient was counseled on the purpose, proper use, and adverse effects of methotrexate including nausea, infection, and signs and symptoms of pneumonitis.  Reviewed instructions with patient to take methotrexate weekly along with folic acid daily.  Discussed the importance of frequent monitoring of kidney and liver function and blood counts, and provided patient with standing lab instructions.  Counseled patient to avoid NSAIDs and alcohol while on methotrexate.  Provided patient with educational materials on methotrexate and answered all questions.  Advised patient to get annual influenza vaccine and to get a pneumococcal vaccine if patient has not already had one.  Patient voiced understanding.  Patient consented to methotrexate use.  Will upload into chart.    High risk medication use - Plaquenil 200 mg 1 tablet by mouth twice daily.  Plan to add on methotrexate 6 tablets by mouth once weekly and folic acid 2 mg daily lab results. CBC and CMP updated on 11/06/23.  Orders for CBC and CMP were released today.  She will require updated lab work in 2 weeks, 2 months, then every 3 months.  Standing orders for CBC and CMP were placed today.  The following baseline immunosuppressive labs will also be obtained prior to initiating methotrexate. No  baseline plaquenil eye examination on file.  - Plan: QuantiFERON-TB Gold Plus, Serum protein  electrophoresis with reflex, IgG, IgA, IgM, Hepatitis B surface antigen, Hepatitis B core antibody, IgM, Hepatitis C antibody, CBC with Differential/Platelet, Comprehensive metabolic panel with GFR  Positive ANA (antinuclear antibody) - ANA 1:80 cytoplasmic, speckled on 03/27/22.  Repeat ANA on 07/12/22 negative.  ANCA negative on 11/06/2023.  Primary osteoarthritis of both hands - X-rays of both hands were consistent with osteoarthritis on 10/03/2023.  Ultrasound was positive for synovitis on 10/04/2023.  Patient presents today with ongoing pain involving both hands.  She is difficulty performing ADLs at times due to severity of pain.  She is been taking Tylenol as needed for pain relief.  Sed rate was within normal limits on 11/06/2023.  She has noticed intermittent inflammation.  Plan to add on methotrexate and continue Plaquenil as prescribed as discussed above.  Raynaud's disease without gangrene - All autoimmune work-up negative.  Cryoglobulin not detected, beta-2 glycoproteins-, cardiolipin ab-, Scl-70 negative, LA not detected, ANCA and ANA negative.   Myofascial pain: Patient continues to experience generalized myalgias and muscle tenderness due to myofascial pain.  She remains on Cymbalta as prescribed and has been taking Tylenol for pain relief.  Chronic pain of both knees: Patient continues to have ongoing pain and stiffness involving both knees.  No warmth or effusion was noted on examination today.  DDD (degenerative disc disease), cervical: Chronic pain.  Painful limited range of motion noted.  Degeneration of intervertebral disc of lumbar region without discogenic back pain or lower extremity pain: Previously under the care of pain management.  She has a TENS unit which she has been using.  She has been using hemp products and taking Tylenol as needed for pain relief.  Pain in both feet - Both x-rays were consistent with early osteoarthritic changes.  No erosive changes were noted.  Patient's  been experiencing increased pain and inflammation involving both feet.  She has soreness over all MTP joints.  Plan to add on methotrexate as discussed above.  Other medical conditions are listed as follows:  Facial rash  Photosensitivity - Chronic photosensitivity.  Essential hypertension: Blood pressure was 101/68 today in the office.  Type 2 diabetes mellitus without complication, without long-term current use of insulin (HCC)  Increased intracranial pressure  AVM (arteriovenous malformation) brain  Hx of migraine headaches  Paresthesia  Attention deficit hyperactivity disorder (ADHD), predominantly inattentive type  Gastroesophageal reflux disease with esophagitis without hemorrhage  PTSD (post-traumatic stress disorder)  Anxiety and depression  History of chronic CHF  Former smoker  Vitamin D deficiency  Other fatigue  Cerebral venous thrombosis of sigmoid sinus  Urinary frequency -Plan to check UA today.  Plan: Urinalysis, Routine w reflex microscopic  Orders: Orders Placed This Encounter  Procedures   Urinalysis, Routine w reflex microscopic   QuantiFERON-TB Gold Plus   Serum protein electrophoresis with reflex   IgG, IgA, IgM   Hepatitis B surface antigen   Hepatitis B core antibody, IgM   Hepatitis C antibody   CBC with Differential/Platelet   Comprehensive metabolic panel with GFR   No orders of the defined types were placed in this encounter.     Follow-Up Instructions: Return in about 8 weeks (around 02/06/2024) for Rheumatoid arthritis, Osteoarthritis.   Gearldine Bienenstock, PA-C  Note - This record has been created using Dragon software.  Chart creation errors have been sought, but may not always  have been located. Such creation errors do not  reflect on  the standard of medical care.

## 2023-12-11 ENCOUNTER — Encounter: Payer: Self-pay | Admitting: Neurology

## 2023-12-12 ENCOUNTER — Ambulatory Visit: Attending: Physician Assistant | Admitting: Physician Assistant

## 2023-12-12 ENCOUNTER — Encounter: Payer: Self-pay | Admitting: Physician Assistant

## 2023-12-12 ENCOUNTER — Other Ambulatory Visit (HOSPITAL_COMMUNITY): Payer: Self-pay

## 2023-12-12 ENCOUNTER — Telehealth: Payer: Self-pay | Admitting: Pharmacist

## 2023-12-12 ENCOUNTER — Telehealth: Payer: Self-pay

## 2023-12-12 VITALS — BP 101/68 | HR 87 | Resp 14 | Ht 66.0 in | Wt 201.0 lb

## 2023-12-12 DIAGNOSIS — R5383 Other fatigue: Secondary | ICD-10-CM

## 2023-12-12 DIAGNOSIS — F32A Depression, unspecified: Secondary | ICD-10-CM

## 2023-12-12 DIAGNOSIS — F900 Attention-deficit hyperactivity disorder, predominantly inattentive type: Secondary | ICD-10-CM

## 2023-12-12 DIAGNOSIS — F4310 Post-traumatic stress disorder, unspecified: Secondary | ICD-10-CM

## 2023-12-12 DIAGNOSIS — M79671 Pain in right foot: Secondary | ICD-10-CM

## 2023-12-12 DIAGNOSIS — M25561 Pain in right knee: Secondary | ICD-10-CM

## 2023-12-12 DIAGNOSIS — F419 Anxiety disorder, unspecified: Secondary | ICD-10-CM

## 2023-12-12 DIAGNOSIS — R350 Frequency of micturition: Secondary | ICD-10-CM

## 2023-12-12 DIAGNOSIS — M19041 Primary osteoarthritis, right hand: Secondary | ICD-10-CM

## 2023-12-12 DIAGNOSIS — M0600 Rheumatoid arthritis without rheumatoid factor, unspecified site: Secondary | ICD-10-CM | POA: Diagnosis not present

## 2023-12-12 DIAGNOSIS — R202 Paresthesia of skin: Secondary | ICD-10-CM

## 2023-12-12 DIAGNOSIS — G8929 Other chronic pain: Secondary | ICD-10-CM

## 2023-12-12 DIAGNOSIS — M51369 Other intervertebral disc degeneration, lumbar region without mention of lumbar back pain or lower extremity pain: Secondary | ICD-10-CM

## 2023-12-12 DIAGNOSIS — K2100 Gastro-esophageal reflux disease with esophagitis, without bleeding: Secondary | ICD-10-CM

## 2023-12-12 DIAGNOSIS — M7918 Myalgia, other site: Secondary | ICD-10-CM

## 2023-12-12 DIAGNOSIS — M5030 Other cervical disc degeneration, unspecified cervical region: Secondary | ICD-10-CM

## 2023-12-12 DIAGNOSIS — L568 Other specified acute skin changes due to ultraviolet radiation: Secondary | ICD-10-CM

## 2023-12-12 DIAGNOSIS — I7300 Raynaud's syndrome without gangrene: Secondary | ICD-10-CM

## 2023-12-12 DIAGNOSIS — E559 Vitamin D deficiency, unspecified: Secondary | ICD-10-CM

## 2023-12-12 DIAGNOSIS — M19042 Primary osteoarthritis, left hand: Secondary | ICD-10-CM

## 2023-12-12 DIAGNOSIS — Q282 Arteriovenous malformation of cerebral vessels: Secondary | ICD-10-CM

## 2023-12-12 DIAGNOSIS — R768 Other specified abnormal immunological findings in serum: Secondary | ICD-10-CM

## 2023-12-12 DIAGNOSIS — I10 Essential (primary) hypertension: Secondary | ICD-10-CM

## 2023-12-12 DIAGNOSIS — Z8669 Personal history of other diseases of the nervous system and sense organs: Secondary | ICD-10-CM

## 2023-12-12 DIAGNOSIS — M79672 Pain in left foot: Secondary | ICD-10-CM

## 2023-12-12 DIAGNOSIS — R21 Rash and other nonspecific skin eruption: Secondary | ICD-10-CM

## 2023-12-12 DIAGNOSIS — E119 Type 2 diabetes mellitus without complications: Secondary | ICD-10-CM

## 2023-12-12 DIAGNOSIS — G08 Intracranial and intraspinal phlebitis and thrombophlebitis: Secondary | ICD-10-CM

## 2023-12-12 DIAGNOSIS — Z8679 Personal history of other diseases of the circulatory system: Secondary | ICD-10-CM

## 2023-12-12 DIAGNOSIS — G932 Benign intracranial hypertension: Secondary | ICD-10-CM

## 2023-12-12 DIAGNOSIS — M25562 Pain in left knee: Secondary | ICD-10-CM

## 2023-12-12 DIAGNOSIS — Z79899 Other long term (current) drug therapy: Secondary | ICD-10-CM

## 2023-12-12 DIAGNOSIS — Z87891 Personal history of nicotine dependence: Secondary | ICD-10-CM

## 2023-12-12 NOTE — Telephone Encounter (Signed)
 Pending baseline labs from today, patient will be oral MTX new start  Dose: 15mg  once weekly with folic acid 2mg  daily  Repeat CBC/CMP in 2 weeks, 2 months, then every 3 months  Avoid alcohol, NSAID, and Tylenol use  Chesley Mires, PharmD, MPH, BCPS, CPP Clinical Pharmacist (Rheumatology and Pulmonology)

## 2023-12-12 NOTE — Telephone Encounter (Signed)
*  [facility]  Pharmacy Patient Advocate Encounter  Received notification from [facility] that Prior Authorization for Ubrelvy 100MG  tablets  has been APPROVED from 12/12/2023 to 12/11/2024   [facility] pharmacies not contracted with plan-unable to test claim for pricing  PA #/Case ID/Reference #: [id number]

## 2023-12-12 NOTE — Patient Instructions (Addendum)
Standing Labs We placed an order today for your standing lab work.   Please have your standing labs drawn in 2 weeks, 2 months, then every 3 months   Please have your labs drawn 2 weeks prior to your appointment so that the provider can discuss your lab results at your appointment, if possible.  Please note that you may see your imaging and lab results in MyChart before we have reviewed them. We will contact you once all results are reviewed. Please allow our office up to 72 hours to thoroughly review all of the results before contacting the office for clarification of your results.  WALK-IN LAB HOURS  Monday through Thursday from 8:00 am -12:30 pm and 1:00 pm-5:00 pm and Friday from 8:00 am-12:00 pm.  Patients with office visits requiring labs will be seen before walk-in labs.  You may encounter longer than normal wait times. Please allow additional time. Wait times may be shorter on  Monday and Thursday afternoons.  We do not book appointments for walk-in labs. We appreciate your patience and understanding with our staff.   Labs are drawn by [facility]. Please bring your co-pay at the time of your lab draw.  You may receive a bill from [facility] for your lab work.  Please note if you are on Hydroxychloroquine and and an order has been placed for a Hydroxychloroquine level,  you will need to have it drawn 4 hours or more after your last dose.  If you wish to have your labs drawn at another location, please call the office 24 hours in advance so we can fax the orders.  The office is located at [address], [place], [place] [address]   If you have any questions regarding directions or hours of operation,  please call [phone].   As a reminder, please drink plenty of water prior to coming for your lab work. Thanks!

## 2023-12-12 NOTE — Progress Notes (Signed)
 Pharmacy Note  Subjective: Patient presents today to [facility] for follow up office visit. Patient seen by the pharmacist for counseling on methotrexate for rheumatoid arthritis. Prior therapy includes: hydroxychloroquine.  Objective: CBC    Component Value Date/Time   WBC 10.0 11/06/2023 1040   RBC 4.45 11/06/2023 1040   HGB 12.5 11/06/2023 1040   HGB 13.5 10/12/2022 0905   HCT 38.2 11/06/2023 1040   HCT 40.7 10/12/2022 0905   PLT 337 11/06/2023 1040   PLT 321 10/12/2022 0905   MCV 85.8 11/06/2023 1040   MCV 85 10/12/2022 0905   MCH 28.1 11/06/2023 1040   MCHC 32.7 11/06/2023 1040   RDW 14.4 11/06/2023 1040   RDW 13.9 10/12/2022 0905   LYMPHSABS 4,040 (H) 04/02/2023 1544   MONOABS 0.6 10/13/2022 1019   EOSABS 230 11/06/2023 1040   BASOSABS 120 11/06/2023 1040    CMP     Component Value Date/Time   NA 136 11/06/2023 1040   NA 141 07/03/2023 0853   K 4.4 11/06/2023 1040   CL 108 11/06/2023 1040   CO2 19 (L) 11/06/2023 1040   GLUCOSE 103 (H) 11/06/2023 1040   BUN 6 (L) 11/06/2023 1040   BUN 4 (L) 07/03/2023 0853   CREATININE 0.73 11/06/2023 1040   CALCIUM 10.0 11/06/2023 1040   PROT 7.7 11/06/2023 1040   PROT 7.1 01/15/2023 1705   ALBUMIN 4.5 01/15/2023 1705   AST 22 11/06/2023 1040   ALT 27 11/06/2023 1040   ALKPHOS 144 (H) 01/15/2023 1705   BILITOT 0.3 11/06/2023 1040   BILITOT <0.2 01/15/2023 1705   GFRNONAA >60 10/15/2022 0046   GFRAA 89 07/15/2020 1052    Baseline Immunosuppressant Therapy Labs TB GOLD   Hepatitis Panel   HIV Lab Results  Component Value Date   HIV Non Reactive 01/31/2022   HIV Non Reactive 07/15/2019   Immunoglobulins   SPEP    Latest Ref Rng & Units 11/06/2023   10:40 AM  Serum Protein Electrophoresis  Total Protein 6.1 - 8.1 g/dL 7.7    [id number] No results found for: "G6PDH" TPMT No results found for: "TPMT"   Chest-xray:  10/13/2022 - No active cardiopulmonary disease.  Contraception:  hysterectomy  Alcohol use: never  Assessment/Plan:   Patient was counseled on the purpose, proper use, and adverse effects of methotrexate including nausea, infection, and signs and symptoms of pneumonitis. Discussed that there is the possibility of an increased risk of malignancy, specifically lymphomas, but it is not well understood if this increased risk is due to the medication or the disease state.  Instructed patient that medication should be held for infection and prior to surgery.  Advised patient to avoid live vaccines. Recommend annual influenza, Pneumovax 23, Prevnar 13, and Shingrix as indicated.   Reviewed instructions with patient to take methotrexate weekly along with folic acid daily.  Discussed the importance of frequent monitoring of kidney and liver function and blood counts, and provided patient with standing lab instructions.  Counseled patient to avoid NSAIDs and alcohol while on methotrexate.  Provided patient with educational materials on methotrexate and answered all questions.   Patient voiced understanding.  Patient consented to methotrexate use.  Will upload into chart.    Dose of methotrexate will be 15mg  (6 tabs) once weekly along with folic acid 2mg  daily. Prescription pending baseline labs. I did review MTX vial/syringe with patient but she became overwhelmed and wanted to start first with oral options.  Chesley Mires, PharmD, MPH, BCPS, CPP  Clinical Pharmacist (Rheumatology and Pulmonology)

## 2023-12-14 ENCOUNTER — Other Ambulatory Visit (HOSPITAL_COMMUNITY): Payer: Self-pay

## 2023-12-16 NOTE — Progress Notes (Signed)
 UA normal  Immunoglobulins WNL Hep B negative and hep C negative  CBC WNL

## 2023-12-18 ENCOUNTER — Encounter: Payer: Self-pay | Admitting: Nurse Practitioner

## 2023-12-18 LAB — CBC WITH DIFFERENTIAL/PLATELET
Absolute Lymphocytes: 3179 {cells}/uL (ref 850–3900)
Absolute Monocytes: 468 {cells}/uL (ref 200–950)
Basophils Absolute: 102 {cells}/uL (ref 0–200)
Basophils Relative: 1.2 %
Eosinophils Absolute: 247 {cells}/uL (ref 15–500)
Eosinophils Relative: 2.9 %
HCT: 37.6 % (ref 35.0–45.0)
Hemoglobin: 12.5 g/dL (ref 11.7–15.5)
MCH: 27.7 pg (ref 27.0–33.0)
MCHC: 33.2 g/dL (ref 32.0–36.0)
MCV: 83.2 fL (ref 80.0–100.0)
MPV: 10.5 fL (ref 7.5–12.5)
Monocytes Relative: 5.5 %
Neutro Abs: 4505 {cells}/uL (ref 1500–7800)
Neutrophils Relative %: 53 %
Platelets: 324 10*3/uL (ref 140–400)
RBC: 4.52 10*6/uL (ref 3.80–5.10)
RDW: 14.7 % (ref 11.0–15.0)
Total Lymphocyte: 37.4 %
WBC: 8.5 10*3/uL (ref 3.8–10.8)

## 2023-12-18 LAB — COMPREHENSIVE METABOLIC PANEL WITH GFR
AG Ratio: 1.6 (calc) (ref 1.0–2.5)
ALT: 20 U/L (ref 6–29)
AST: 28 U/L (ref 10–35)
Albumin: 4.5 g/dL (ref 3.6–5.1)
Alkaline phosphatase (APISO): 128 U/L — ABNORMAL HIGH (ref 31–125)
BUN/Creatinine Ratio: 9 (calc) (ref 6–22)
BUN: 6 mg/dL — ABNORMAL LOW (ref 7–25)
CO2: 17 mmol/L — ABNORMAL LOW (ref 20–32)
Calcium: 9.7 mg/dL (ref 8.6–10.2)
Chloride: 109 mmol/L (ref 98–110)
Creat: 0.7 mg/dL (ref 0.50–0.99)
Globulin: 2.8 g/dL (ref 1.9–3.7)
Glucose, Bld: 84 mg/dL (ref 65–99)
Potassium: 4.3 mmol/L (ref 3.5–5.3)
Sodium: 139 mmol/L (ref 135–146)
Total Bilirubin: 0.3 mg/dL (ref 0.2–1.2)
Total Protein: 7.3 g/dL (ref 6.1–8.1)
eGFR: 108 mL/min/{1.73_m2} (ref >=60)

## 2023-12-18 LAB — URINALYSIS, ROUTINE W REFLEX MICROSCOPIC
Bilirubin Urine: NEGATIVE
Glucose, UA: NEGATIVE
Hgb urine dipstick: NEGATIVE
Ketones, ur: NEGATIVE
Leukocytes,Ua: NEGATIVE
Nitrite: NEGATIVE
Protein, ur: NEGATIVE
Specific Gravity, Urine: 1.01 (ref 1.001–1.035)
pH: 5.5 (ref 5.0–8.0)

## 2023-12-18 LAB — PROTEIN ELECTROPHORESIS, SERUM, WITH REFLEX
Albumin ELP: 4.2 g/dL (ref 3.8–4.8)
Alpha 1: 0.3 g/dL (ref 0.2–0.3)
Alpha 2: 1 g/dL — ABNORMAL HIGH (ref 0.5–0.9)
Beta 2: 0.4 g/dL (ref 0.2–0.5)
Beta Globulin: 0.5 g/dL (ref 0.4–0.6)
Gamma Globulin: 1 g/dL (ref 0.8–1.7)
Total Protein: 7.3 g/dL (ref 6.1–8.1)

## 2023-12-18 LAB — QUANTIFERON-TB GOLD PLUS
Mitogen-NIL: >10 [IU]/mL
NIL: 0.05 [IU]/mL
QuantiFERON-TB Gold Plus: NEGATIVE
TB1-NIL: 0 [IU]/mL
TB2-NIL: 0 [IU]/mL

## 2023-12-18 LAB — IGG, IGA, IGM
IgG (Immunoglobin G), Serum: 1143 mg/dL (ref 600–1640)
IgM, Serum: 91 mg/dL (ref 50–300)
Immunoglobulin A: 161 mg/dL (ref 47–310)

## 2023-12-18 LAB — HEPATITIS C ANTIBODY: Hepatitis C Ab: NONREACTIVE

## 2023-12-18 LAB — HEPATITIS B SURFACE ANTIGEN: Hepatitis B Surface Ag: NONREACTIVE

## 2023-12-18 LAB — HEPATITIS B CORE ANTIBODY, IGM: Hep B C IgM: NONREACTIVE

## 2023-12-18 MED ORDER — FOLIC ACID 1 MG PO TABS: 2.0000 mg | ORAL_TABLET | Freq: Every day | ORAL | 3 refills | Status: AC

## 2023-12-18 MED ORDER — METHOTREXATE SODIUM 2.5 MG PO TABS: 15.0000 mg | ORAL_TABLET | ORAL | 0 refills | Status: DC

## 2023-12-18 NOTE — Progress Notes (Signed)
 CMP stable.    TB gold negative

## 2023-12-18 NOTE — Progress Notes (Signed)
SPEP no abnormal protein bands.

## 2023-12-29 ENCOUNTER — Other Ambulatory Visit: Payer: Self-pay | Admitting: Neurology

## 2024-01-02 LAB — HM DIABETES EYE EXAM

## 2024-01-08 ENCOUNTER — Other Ambulatory Visit: Payer: Self-pay | Admitting: Physician Assistant

## 2024-01-08 NOTE — Telephone Encounter (Signed)
 Last Fill: 12/18/2023  Labs: 12/14/2023 CBC WNL CMP stable.    Next Visit: 02/07/2024  Last Visit: 12/12/2023  DX: Seronegative rheumatoid arthritis   Current Dose per office note 12/12/2023: methotrexate  6 tablets by mouth once weekly   Okay to refill Methotrexate ?

## 2024-01-16 ENCOUNTER — Emergency Department (HOSPITAL_COMMUNITY): Admission: EM | Admit: 2024-01-16 | Discharge: 2024-01-16 | Disposition: A | Discharge status: Home / Self Care

## 2024-01-16 ENCOUNTER — Other Ambulatory Visit: Payer: Self-pay

## 2024-01-16 ENCOUNTER — Emergency Department (HOSPITAL_COMMUNITY)

## 2024-01-16 DIAGNOSIS — E119 Type 2 diabetes mellitus without complications: Secondary | ICD-10-CM | POA: Insufficient documentation

## 2024-01-16 DIAGNOSIS — E871 Hypo-osmolality and hyponatremia: Secondary | ICD-10-CM | POA: Insufficient documentation

## 2024-01-16 DIAGNOSIS — Z794 Long term (current) use of insulin: Secondary | ICD-10-CM | POA: Diagnosis not present

## 2024-01-16 DIAGNOSIS — Z79899 Other long term (current) drug therapy: Secondary | ICD-10-CM | POA: Insufficient documentation

## 2024-01-16 DIAGNOSIS — I509 Heart failure, unspecified: Secondary | ICD-10-CM | POA: Diagnosis not present

## 2024-01-16 DIAGNOSIS — R1013 Epigastric pain: Secondary | ICD-10-CM

## 2024-01-16 DIAGNOSIS — I110 Hypertensive heart disease with heart failure: Secondary | ICD-10-CM | POA: Diagnosis not present

## 2024-01-16 DIAGNOSIS — Z7982 Long term (current) use of aspirin: Secondary | ICD-10-CM | POA: Diagnosis not present

## 2024-01-16 DIAGNOSIS — K5730 Diverticulosis of large intestine without perforation or abscess without bleeding: Secondary | ICD-10-CM | POA: Insufficient documentation

## 2024-01-16 DIAGNOSIS — Z7984 Long term (current) use of oral hypoglycemic drugs: Secondary | ICD-10-CM | POA: Insufficient documentation

## 2024-01-16 DIAGNOSIS — K5790 Diverticulosis of intestine, part unspecified, without perforation or abscess without bleeding: Secondary | ICD-10-CM

## 2024-01-16 DIAGNOSIS — R1012 Left upper quadrant pain: Secondary | ICD-10-CM | POA: Diagnosis present

## 2024-01-16 DIAGNOSIS — K5792 Diverticulitis of intestine, part unspecified, without perforation or abscess without bleeding: Secondary | ICD-10-CM

## 2024-01-16 HISTORY — DX: Diverticulitis of intestine, part unspecified, without perforation or abscess without bleeding: K57.92

## 2024-01-16 LAB — URINALYSIS, W/ REFLEX TO CULTURE (INFECTION SUSPECTED)
Bacteria, UA: NONE SEEN
Bilirubin Urine: NEGATIVE
Glucose, UA: NEGATIVE mg/dL
Hgb urine dipstick: NEGATIVE
Ketones, ur: 5 mg/dL — AB
Nitrite: NEGATIVE
Protein, ur: 100 mg/dL — AB
RBC / HPF: >50 RBC/hpf (ref 0–5)
Specific Gravity, Urine: 1.023 (ref 1.005–1.030)
pH: 5 (ref 5.0–8.0)

## 2024-01-16 LAB — COMPREHENSIVE METABOLIC PANEL WITH GFR
ALT: 16 U/L (ref 0–44)
AST: 22 U/L (ref 15–41)
Albumin: 4.5 g/dL (ref 3.5–5.0)
Alkaline Phosphatase: 99 U/L (ref 38–126)
Anion gap: 8 (ref 5–15)
BUN: 9 mg/dL (ref 6–20)
CO2: 22 mmol/L (ref 22–32)
Calcium: 9.7 mg/dL (ref 8.9–10.3)
Chloride: 110 mmol/L (ref 98–111)
Creatinine, Ser: 0.58 mg/dL (ref 0.44–1.00)
GFR, Estimated: >60 mL/min (ref >60)
Glucose, Bld: 83 mg/dL (ref 70–99)
Potassium: 3.2 mmol/L — ABNORMAL LOW (ref 3.5–5.1)
Sodium: 140 mmol/L (ref 135–145)
Total Bilirubin: 0.6 mg/dL (ref 0.0–1.2)
Total Protein: 8.1 g/dL (ref 6.5–8.1)

## 2024-01-16 LAB — CBC WITH DIFFERENTIAL/PLATELET
Abs Immature Granulocytes: 0.03 10*3/uL (ref 0.00–0.07)
Basophils Absolute: 0.1 10*3/uL (ref 0.0–0.1)
Basophils Relative: 1 %
Eosinophils Absolute: 0.2 10*3/uL (ref 0.0–0.5)
Eosinophils Relative: 2 %
HCT: 40.2 % (ref 36.0–46.0)
Hemoglobin: 13.4 g/dL (ref 12.0–15.0)
Immature Granulocytes: 0 %
Lymphocytes Relative: 41 %
Lymphs Abs: 3.6 10*3/uL (ref 0.7–4.0)
MCH: 28.2 pg (ref 26.0–34.0)
MCHC: 33.3 g/dL (ref 30.0–36.0)
MCV: 84.6 fL (ref 80.0–100.0)
Monocytes Absolute: 0.7 10*3/uL (ref 0.1–1.0)
Monocytes Relative: 7 %
Neutro Abs: 4.3 10*3/uL (ref 1.7–7.7)
Neutrophils Relative %: 49 %
Platelets: 351 10*3/uL (ref 150–400)
RBC: 4.75 MIL/uL (ref 3.87–5.11)
RDW: 15.9 % — ABNORMAL HIGH (ref 11.5–15.5)
WBC: 8.9 10*3/uL (ref 4.0–10.5)
nRBC: 0 % (ref 0.0–0.2)

## 2024-01-16 LAB — LIPASE, BLOOD: Lipase: 31 U/L (ref 11–51)

## 2024-01-16 MED ORDER — POTASSIUM CHLORIDE CRYS ER 20 MEQ PO TBCR
40.0000 meq | EXTENDED_RELEASE_TABLET | Freq: Once | ORAL | Status: AC
  Administered 2024-01-16: 40 meq via ORAL
  Filled 2024-01-16: qty 2

## 2024-01-16 MED ORDER — ONDANSETRON 4 MG PO TBDP: 4.0000 mg | ORAL_TABLET | Freq: Three times a day (TID) | ORAL | 0 refills | Status: AC | PRN

## 2024-01-16 MED ORDER — IOHEXOL 300 MG/ML  SOLN
100.0000 mL | Freq: Once | INTRAMUSCULAR | Status: AC | PRN
  Administered 2024-01-16: 100 mL via INTRAVENOUS

## 2024-01-16 MED ORDER — MORPHINE SULFATE (PF) 4 MG/ML IV SOLN
4.0000 mg | Freq: Once | INTRAVENOUS | Status: AC
  Administered 2024-01-16: 4 mg via INTRAVENOUS
  Filled 2024-01-16: qty 1

## 2024-01-16 MED ORDER — PANTOPRAZOLE SODIUM 40 MG IV SOLR
40.0000 mg | Freq: Once | INTRAVENOUS | Status: AC
  Administered 2024-01-16: 40 mg via INTRAVENOUS
  Filled 2024-01-16: qty 10

## 2024-01-16 MED ORDER — IOHEXOL 300 MG/ML  SOLN: 100.0000 mL | Freq: Once | INTRAMUSCULAR | Status: DC | PRN

## 2024-01-16 MED ORDER — ONDANSETRON HCL 4 MG/2ML IJ SOLN
4.0000 mg | Freq: Once | INTRAMUSCULAR | Status: AC
  Administered 2024-01-16: 4 mg via INTRAVENOUS
  Filled 2024-01-16: qty 2

## 2024-01-16 MED ORDER — ALUM & MAG HYDROXIDE-SIMETH 200-200-20 MG/5ML PO SUSP
30.0000 mL | Freq: Once | ORAL | Status: AC
  Administered 2024-01-16: 30 mL via ORAL
  Filled 2024-01-16: qty 30

## 2024-01-16 MED ORDER — FAMOTIDINE 20 MG PO TABS: 20.0000 mg | ORAL_TABLET | Freq: Two times a day (BID) | ORAL | 0 refills | Status: AC

## 2024-01-16 NOTE — Discharge Instructions (Addendum)
 It was a pleasure caring for you today. Please follow up with your primary care provider. Seek emergency care if experiencing any new or worsening symptoms.

## 2024-01-16 NOTE — ED Provider Triage Note (Signed)
 Emergency Medicine Provider Triage Evaluation Note  Virginia Douglas , a 48 y.o. female  was evaluated in triage.  Pt complains of abdominal pain, vomiting.  Review of Systems  Positive:  Negative:   Physical Exam  BP 124/82 (BP Location: Left Arm)   Pulse (!) 101   Temp 98.2 F (36.8 C) (Oral)   Resp 18   Ht 5\' 6"  (1.676 m)   Wt 85.7 kg   SpO2 96%   BMI 30.51 kg/m  Gen:   Awake, no distress   Resp:  Normal effort  MSK:   Moves extremities without difficulty  Other:    Medical Decision Making  Medically screening exam initiated at 3:43 PM.  Appropriate orders placed.  Jannet Memos Beharry was informed that the remainder of the evaluation will be completed by another provider, this initial triage assessment does not replace that evaluation, and the importance of remaining in the ED until their evaluation is complete.  Left upper abdominal pain radiating into back with daily vomiting x3 weeks. Also with intermittent diarrhea and fevers. Denies cough, dysuria, hematochezia. Last epsiode of diarrhea yesterday. Last episode of vomiting 2hrs ago.   [place], [place] 01/16/24 1545

## 2024-01-16 NOTE — ED Provider Notes (Signed)
 [place] EMERGENCY DEPARTMENT AT [facility] Provider Note   CSN: [id number] Arrival date & time: 01/16/24  1529     History  Chief Complaint  Patient presents with   Back Pain    Virginia Douglas is a 48 y.o. female with PMHx CHF, ADHD, anemia, anxiety, OA, depression, diabetes, GERD, HTN, migraines, who presents to ED concerned for left upper abdominal pain radiating towards the back with daily vomiting x3 weeks. Endorses weight loss d/t her symptoms. Also endorses intermittent diarrhea and intermittent fevers. Denies cough, dysuria, hematochezia. Last episode of diarrhea yesterday. Last episode of vomiting shortly prior to ED visit. Patient stating that eating exacerbates pain.   Patient stating that she used to take Omeprazole in the past but has not taken it recently.   Denies chest pain, dyspnea.    Back Pain Associated symptoms: abdominal pain        Home Medications Prior to Admission medications   Medication Sig Start Date End Date Taking? Authorizing Provider  folic acid  (FOLVITE ) 1 MG tablet Take 2 tablets (2 mg total) by mouth daily. 12/18/23   Romayne Clubs, PA-C  ondansetron  (ZOFRAN -ODT) 4 MG disintegrating tablet Take 1 tablet (4 mg total) by mouth every 8 (eight) hours as needed for nausea. 01/16/24  Yes Judye Lorino F, PA-C  ADDERALL XR 30 MG 24 hr capsule Take 30 mg by mouth daily. Patient not taking: Reported on 12/12/2023 09/29/20   [provider]  albuterol  (VENTOLIN  HFA) 108 (90 Base) MCG/ACT inhaler Inhale 2 puffs into the lungs as needed. 12/12/22   [provider]  aspirin  EC 81 MG tablet Take 81 mg by mouth daily. Swallow whole.    [provider]  atorvastatin  (LIPITOR) 10 MG tablet TAKE 1 TABLET BY MOUTH EVERY DAY 02/07/23   Susanna Epley, FNP  Blood Pressure Monitoring Soln KIT  01/01/23   [provider]  buPROPion  (WELLBUTRIN  XL) 150 MG 24 hr tablet Take 1 tablet (150 mg total) by mouth daily.  10/18/23   Moore, Janece, FNP  carvedilol  (COREG ) 3.125 MG tablet Take 1 tablet (3.125 mg total) by mouth 2 (two) times daily. 08/08/23   Wendie Hamburg, MD  cetirizine (ZYRTEC) 10 MG tablet Take 10 mg by mouth daily as needed for allergies.    [provider]  clonazePAM  (KLONOPIN ) 0.5 MG tablet Take 1 tablet (0.5 mg total) by mouth daily as needed. 10/18/23   Susanna Epley, FNP  clopidogrel  (PLAVIX ) 75 MG tablet Take 1 tablet (75 mg total) by mouth daily. 02/08/23   Nathan Bake A, PA-C  DULoxetine  (CYMBALTA ) 60 MG capsule TAKE 1 CAPSULE BY MOUTH EVERY DAY 12/31/23   Ellene Gustin, MD  empagliflozin  (JARDIANCE ) 10 MG TABS tablet Take 1 tablet by mouth once daily in the morning 10/12/22   Wendie Hamburg, MD  eszopiclone  (LUNESTA ) 2 MG TABS tablet Take 1 tablet (2 mg total) by mouth at bedtime. Take immediately before bedtime 01/15/23 01/15/24  Moore, Janece, FNP  famotidine  (PEPCID ) 20 MG tablet Take 1 tablet (20 mg total) by mouth 2 (two) times daily for 14 days. 01/16/24 01/30/24 Yes Orvella Digiulio, Twila Gale F, PA-C  Galcanezumab -gnlm (EMGALITY ) 120 MG/ML SOAJ Inject 120 mg into the skin every 30 (thirty) days. 05/17/23   Merriam Abbey, DO  guanFACINE (INTUNIV) 1 MG TB24 ER tablet Take 1 mg by mouth daily. 12/07/22   [provider]  HYDROcodone -acetaminophen  (NORCO) 7.5-325 MG tablet Take 1 tablet by mouth every 12 (  twelve) hours as needed for moderate pain (pain score 4-6). Please do not fill until 10/01/23 Patient not taking: Reported on 12/12/2023 10/25/23   Lylia Sand, MD  hydroxychloroquine  (PLAQUENIL ) 200 MG tablet Take 1 tablet (200 mg total) by mouth 2 (two) times daily. 10/16/23   Romayne Clubs, PA-C  insulin  aspart (FIASP  FLEXTOUCH) 100 UNIT/ML FlexTouch Pen INJECT [place] [facility] INSULIN  BEFORE MEALS AND AT HS IF BS < 150 - 0 UNITS, 150-199 - 3 UNITS, 200-249 - 6 UNITS, 250-299 - 9 UNITS, 300-349 - 12 UNITS, >350 - 15 UNITS. IF >400 CALL OUR OFFICE WITH YOUR BLOOD SUGAR  READINGS. 04/03/23   Susanna Epley, FNP  Insulin  Pen Needle (PEN NEEDLES) 32G X 4 MM MISC 1 each by Does not apply route once a week. 12/08/22   Susanna Epley, FNP  Lancets [facility] DELICA PLUS [place]) MISC Apply 1 each topically 3 (three) times daily. 12/05/22   [provider]  LINZESS  72 MCG capsule TAKE 1 CAPSULE BY MOUTH DAILY BEFORE BREAKFAST. 01/12/23   Susanna Epley, FNP  methotrexate  (RHEUMATREX) 2.5 MG tablet TAKE 6 TABLETS (15 MG TOTAL) BY MOUTH ONCE A WEEK. CAUTION:CHEMOTHERAPY. PROTECT FROM LIGHT. 01/08/24   Nicholas Bari, MD  ondansetron  (ZOFRAN ) 4 MG tablet Take 1 tablet (4 mg total) by mouth every 8 (eight) hours as needed. 11/15/23   Merriam Abbey, DO  phentermine  (ADIPEX-P ) 37.5 MG tablet Take 1 tablet (37.5 mg total) by mouth daily before breakfast. Patient not taking: Reported on 12/12/2023 01/15/23   Moore, Janece, FNP  Polyethyl Glycol-Propyl Glycol (SYSTANE) 0.4-0.3 % SOLN Place 1 drop into both eyes daily as needed (dry eyes).    [provider]  sacubitril-valsartan (ENTRESTO ) 24-26 MG Take 1 tablet by mouth 2 (two) times daily. 02/28/23   Wendie Hamburg, MD  spironolactone  (ALDACTONE ) 25 MG tablet Take 0.5 tablets (12.5 mg total) by mouth daily. 11/06/22 11/15/23  Wendie Hamburg, MD  tirzepatide  (MOUNJARO ) 10 MG/0.5ML Pen Inject 10 mg into the skin once a week. 10/18/23   Susanna Epley, FNP  topiramate  (TOPAMAX ) 100 MG tablet Take 2 tablets (200 mg total) by mouth at bedtime. 11/15/23   Merriam Abbey, DO  Ubrogepant  (UBRELVY ) 100 MG TABS Take 1 tablet (100 mg total) by mouth as needed. May repeat after 2 hours.  Maximum 2 tablets. 11/15/23   Jaffe, Adam R, DO  Vitamin D , Ergocalciferol , (DRISDOL ) 1.25 MG (50000 UNIT) CAPS capsule TAKE 1 CAPSULE BY MOUTH EVERY 7 DAYS **NOT COVERED** 10/25/23   Moore, Janece, FNP  metFORMIN  (GLUCOPHAGE -XR) 500 MG 24 hr tablet Take 2 tablets (1,000 mg total) by mouth 2 (two) times daily. Patient not taking: Reported on  06/05/2019 03/10/16 08/15/19  Roberts Ching, MD  Phentermine -Topiramate  11.25-69 MG CP24 Take 1 tablet by mouth daily. 02/04/19 04/07/19  Susanna Epley, FNP      Allergies    Patient has no known allergies.    Review of Systems   Review of Systems  Gastrointestinal:  Positive for abdominal pain.    Physical Exam Updated Vital Signs BP 115/81   Pulse 85   Temp 98.1 F (36.7 C)   Resp 16   Ht 5\' 6"  (1.676 m)   Wt 85.7 kg   SpO2 98%   BMI 30.51 kg/m  Physical Exam Vitals and nursing note reviewed.  Constitutional:      General: She is not in acute distress.    Appearance: She is not ill-appearing or toxic-appearing.  HENT:  Head: Normocephalic and atraumatic.     Mouth/Throat:     Mouth: Mucous membranes are moist.  Eyes:     General: No scleral icterus.       Right eye: No discharge.        Left eye: No discharge.     Conjunctiva/sclera: Conjunctivae normal.  Cardiovascular:     Rate and Rhythm: Normal rate and regular rhythm.     Pulses: Normal pulses.     Heart sounds: Normal heart sounds. No murmur heard. Pulmonary:     Effort: Pulmonary effort is normal. No respiratory distress.     Breath sounds: Normal breath sounds. No wheezing, rhonchi or rales.  Abdominal:     General: Abdomen is flat. Bowel sounds are normal. There is no distension.     Palpations: Abdomen is soft. There is no mass.     Tenderness: There is abdominal tenderness.     Comments: Epigastric tenderness to palpation  Musculoskeletal:     Right lower leg: No edema.     Left lower leg: No edema.  Skin:    General: Skin is warm and dry.     Findings: No rash.  Neurological:     General: No focal deficit present.     Mental Status: She is alert and oriented to person, place, and time. Mental status is at baseline.  Psychiatric:        Mood and Affect: Mood normal.        Behavior: Behavior normal.     ED Results / Procedures / Treatments   Labs (all labs ordered are listed, but only  abnormal results are displayed) Labs Reviewed  CBC WITH DIFFERENTIAL/PLATELET - Abnormal; Notable for the following components:      Result Value   RDW 15.9 (*)    All other components within normal limits  URINALYSIS, W/ REFLEX TO CULTURE (INFECTION SUSPECTED) - Abnormal; Notable for the following components:   Color, Urine AMBER (*)    APPearance CLOUDY (*)    Ketones, ur 5 (*)    Protein, ur 100 (*)    Leukocytes,Ua TRACE (*)    Non Squamous Epithelial 0-5 (*)    All other components within normal limits  COMPREHENSIVE METABOLIC PANEL WITH GFR - Abnormal; Notable for the following components:   Potassium 3.2 (*)    All other components within normal limits  LIPASE, BLOOD    EKG None  Radiology CT ABDOMEN PELVIS W CONTRAST Result Date: 01/16/2024 CLINICAL DATA:  Abdominal pain, acute, nonlocalized, vomiting, left-sided rib pain EXAM: CT ABDOMEN AND PELVIS WITH CONTRAST TECHNIQUE: Multidetector CT imaging of the abdomen and pelvis was performed using the standard protocol following bolus administration of intravenous contrast. RADIATION DOSE REDUCTION: This exam was performed according to the departmental dose-optimization program which includes automated exposure control, adjustment of the mA and/or kV according to patient size and/or use of iterative reconstruction technique. CONTRAST:  OMNIPAQUE  IOHEXOL  300 MG/ML  SOLN COMPARISON:  November 23, 2022 FINDINGS: Lower chest: No focal airspace consolidation or pleural effusion.Multifocal subsegmental atelectasis in the lung bases. Hepatobiliary: No mass. Focal fatty infiltration along the falciform ligament. Cholecystectomy. No intrahepatic or extrahepatic biliary ductal dilation. The portal veins are patent. Pancreas: No mass or main ductal dilation. No peripancreatic inflammation or fluid collection. Spleen: Normal size. No mass. Adrenals/Urinary Tract: No adrenal masses. No renal mass. No nephrolithiasis or hydronephrosis. Partially  distended urinary bladder without visualized abnormality. Stomach/Bowel: The stomach is decompressed without focal abnormality. No small  bowel wall thickening or inflammation. No small bowel obstruction.Normal appendix. Descending and sigmoid colonic diverticulosis. No changes of acute diverticulitis. Vascular/Lymphatic: No aortic aneurysm. Scattered aortoiliac atherosclerosis. No intraabdominal or pelvic lymphadenopathy. Reproductive: Hysterectomy. No concerning adnexal mass.No free pelvic fluid. Other: No pneumoperitoneum, ascites, or mesenteric inflammation. Couple of surgical clips in the left lower quadrant omentum. Musculoskeletal: No acute fracture or destructive lesion. Multilevel thoracic osteophytosis. Moderate joint space loss of both hips, suggesting osteoarthritis. IMPRESSION: 1. No acute intraabdominal or pelvic abnormality. 2. Descending and sigmoid colonic diverticulosis. No changes of acute diverticulitis. Aortic Atherosclerosis (ICD10-I70.0). Electronically Signed   By: Rance Burrows M.D.   On: 01/16/2024 20:56    Procedures Procedures    Medications Ordered in ED Medications  ondansetron  (ZOFRAN ) injection 4 mg (4 mg Intravenous Given 01/16/24 1603)  morphine  (PF) 4 MG/ML injection 4 mg (4 mg Intravenous Given 01/16/24 1958)  pantoprazole  (PROTONIX ) injection 40 mg (40 mg Intravenous Given 01/16/24 1958)  potassium chloride  SA (KLOR-CON  M) CR tablet 40 mEq (40 mEq Oral Given 01/16/24 2057)  ondansetron  (ZOFRAN ) injection 4 mg (4 mg Intravenous Given 01/16/24 2057)  alum & mag hydroxide-simeth (MAALOX/MYLANTA) 200-200-20 MG/5ML suspension 30 mL (30 mLs Oral Given 01/16/24 2057)  iohexol  (OMNIPAQUE ) 300 MG/ML solution 100 mL (100 mLs Intravenous Contrast Given 01/16/24 2028)    ED Course/ Medical Decision Making/ A&P                                 Medical Decision Making Amount and/or Complexity of Data Reviewed Labs: ordered. Radiology: ordered.  Risk OTC  drugs. Prescription drug management.    This patient presents to the ED for concern of abdominal pain, this involves an extensive number of treatment options, and is a complaint that carries with it a high risk of complications and morbidity.  The differential diagnosis includes gastroenteritis, colitis, small bowel obstruction, appendicitis, cholecystitis, pancreatitis, nephrolithiasis, UTI, pyleonephritis, ruptured ectopic pregnancy, PID, ovarian torsion.   Co morbidities that complicate the patient evaluation  CHF, ADHD, anemia, anxiety, OA, depression, diabetes, GERD, HTN, migraines   Additional history obtained:  Dr. Sulema Endo PCP   Problem List / ED Course / Critical interventions / Medication management  Patient presented for abdominal pain and vomiting. Also with intermittent diarrhea and fevers. Physical exam with epigastric tenderness to palpation. Rest of physical exam reassuring. Patient afebrile with stable vitals. I Ordered, and personally interpreted labs.  Lipase within normal limits.  CBC without leukocytosis or anemia.  CMP with mild hyponatremia at 3.2.  UA with contaminants and is not overly concerning for infection. I ordered imaging studies including CT Abd/Pelvis with contrast: evaluate for structural/surgical etiology of patients' severe abdominal pain.  I independently visualized and interpreted imaging and I agree with the radiologist interpretation of no acute process. Provided patient with GI cocktail which helped relieved her symptoms.  Patient tolerating PO intake.  Shared all results with patient.  Answered all questions.  It appears that patient's symptoms are due to peptic/gastric ulcer without complication.  I will prescribe patient Pepcid  and Zofran  for symptom control and have her follow-up with her PCP.  Also provided patient with information for [facility].  Patient verbalized understanding of plan. I have reviewed the patients home medicines and have made  adjustments as needed The patient has been appropriately medically screened and/or stabilized in the ED. I have low suspicion for any other emergent medical condition which would require further  screening, evaluation or treatment in the ED or require inpatient management. At time of discharge the patient is hemodynamically stable and in no acute distress. I have discussed work-up results and diagnosis with patient and answered all questions. Patient is agreeable with discharge plan. We discussed strict return precautions for returning to the emergency department and they verbalized understanding.     Social Determinants of Health:  none          Final Clinical Impression(s) / ED Diagnoses Final diagnoses:  Epigastric abdominal pain  Diverticulosis    Rx / DC Orders ED Discharge Orders          Ordered    famotidine  (PEPCID ) 20 MG tablet  2 times daily        01/16/24 2124    ondansetron  (ZOFRAN -ODT) 4 MG disintegrating tablet  Every 8 hours PRN        01/16/24 2125              [place], PA-C 01/16/24 2132    Rolinda Climes, DO 01/17/24 0000

## 2024-01-16 NOTE — ED Triage Notes (Signed)
 Left sided rib area pain for 1 week, denies injury. Pt vomited 3 times today and states she has been vomiting every day for 2 weeks. States she has diarrhea and no appetite

## 2024-01-17 ENCOUNTER — Ambulatory Visit: Admitting: Nurse Practitioner

## 2024-01-17 ENCOUNTER — Encounter: Payer: Self-pay | Admitting: Nurse Practitioner

## 2024-01-17 VITALS — BP 100/60 | HR 104 | Temp 99.0°F | Ht 66.0 in | Wt 187.0 lb

## 2024-01-17 DIAGNOSIS — E1169 Type 2 diabetes mellitus with other specified complication: Secondary | ICD-10-CM

## 2024-01-17 DIAGNOSIS — K5790 Diverticulosis of intestine, part unspecified, without perforation or abscess without bleeding: Secondary | ICD-10-CM

## 2024-01-17 DIAGNOSIS — Z6830 Body mass index (BMI) 30.0-30.9, adult: Secondary | ICD-10-CM

## 2024-01-17 DIAGNOSIS — E6609 Other obesity due to excess calories: Secondary | ICD-10-CM

## 2024-01-17 DIAGNOSIS — R634 Abnormal weight loss: Secondary | ICD-10-CM | POA: Diagnosis not present

## 2024-01-17 DIAGNOSIS — E669 Obesity, unspecified: Secondary | ICD-10-CM

## 2024-01-17 DIAGNOSIS — E66811 Obesity, class 1: Secondary | ICD-10-CM

## 2024-01-17 DIAGNOSIS — R5383 Other fatigue: Secondary | ICD-10-CM

## 2024-01-17 DIAGNOSIS — R21 Rash and other nonspecific skin eruption: Secondary | ICD-10-CM

## 2024-01-17 MED ORDER — AMOXICILLIN-POT CLAVULANATE 875-125 MG PO TABS: 1.0000 | ORAL_TABLET | Freq: Three times a day (TID) | ORAL | 0 refills | Status: DC

## 2024-01-17 MED ORDER — METRONIDAZOLE 500 MG PO TABS: 500.0000 mg | ORAL_TABLET | Freq: Four times a day (QID) | ORAL | 0 refills | Status: AC

## 2024-01-17 NOTE — Progress Notes (Signed)
 [place], CMA,acting as a Neurosurgeon for Susanna Epley, FNP.,have documented all relevant documentation on the behalf of Susanna Epley, FNP,as directed by  Susanna Epley, FNP while in the presence of Susanna Epley, FNP.  Subjective:  Patient ID: Virginia Douglas , female    DOB: 07/21/1976 , 48 y.o.   MRN: [id number]  Chief Complaint  Patient presents with   Diverticulosis    Patient reports she went to the ER last night and was diagnosed with Diverticulosis- patient reports the pain first started about 2 weeks ago. She reports she wasn't giving much medicine to help with it but she is in a lot of pain. Patient reports she has no appetite, she reports she is also vomiting that started a couple days ago. She reports she hasn't eaten or drunk anything today due to now being able to keep it down.    Back Pain    Patient reports she she is no longer going to pain management because she didn't feel like it was helping her and she doesn't like taking pain pills.     HPI  She was seen in the ER yesterday due to abdomen pain, nausea and vomiting. She has been having difficulty eating for a couple weeks. When she takes a hemp gummy she will eat. She does not have an appetite. When she does eat she has vomiting or diarrhea. The last 2-3 days she has more vomiting.      Past Medical History:  Diagnosis Date   Acute pain of left shoulder 06/05/2019   ADHD (attention deficit hyperactivity disorder)    Anemia    iron deficiency   as a teenager   Anxiety    Arthritis    Chronic combined systolic and diastolic congestive heart failure (HCC) 11/23/2022   This is a new diagnosis with Cardiology, she is scheduled on 2/14 for her MRI of her heart.     Depression    Diabetes (HCC)    Edema    GERD (gastroesophageal reflux disease)    History of congestive heart failure    dx by cardiology   Hypertension    patient denies- patient stated that the PCP said it was for heart prevention.   Left leg  pain 04/07/2019   LLQ pain 12/08/2015   Migraine headache    Urinary urgency 09/16/2015     Family History  Problem Relation Age of Onset   Diabetes Mother    Hypertension Mother    Diabetes Father    Hypertension Father    Kidney failure Father    Anxiety disorder Father    Hypertension Brother    Cancer Maternal Grandmother    Diabetes Maternal Grandmother    Pancreatic cancer Maternal Grandmother    Arthritis Maternal Grandmother    Diabetes Paternal Grandmother    Breast cancer Paternal Grandmother    Stroke Paternal Grandfather    Healthy Son    ADD / ADHD Son    Healthy Son    Asthma Son    Cancer Maternal Aunt        breast   Breast cancer Maternal Aunt    Birth defects Maternal Aunt      Current Outpatient Medications:    albuterol  (VENTOLIN  HFA) 108 (90 Base) MCG/ACT inhaler, Inhale 2 puffs into the lungs as needed., Disp: , Rfl:    amoxicillin -clavulanate (AUGMENTIN ) 875-125 MG tablet, Take 1 tablet by mouth 3 (three) times daily., Disp: 21 tablet, Rfl: 0   aspirin  EC  81 MG tablet, Take 81 mg by mouth daily. Swallow whole., Disp: , Rfl:    atorvastatin  (LIPITOR) 10 MG tablet, TAKE 1 TABLET BY MOUTH EVERY DAY, Disp: 90 tablet, Rfl: 1   Blood Pressure Monitoring Soln KIT, , Disp: , Rfl:    buPROPion  (WELLBUTRIN  XL) 150 MG 24 hr tablet, Take 1 tablet (150 mg total) by mouth daily., Disp: 90 tablet, Rfl: 1   carvedilol  (COREG ) 3.125 MG tablet, Take 1 tablet (3.125 mg total) by mouth 2 (two) times daily., Disp: 180 tablet, Rfl: 3   cetirizine (ZYRTEC) 10 MG tablet, Take 10 mg by mouth daily as needed for allergies., Disp: , Rfl:    clonazePAM  (KLONOPIN ) 0.5 MG tablet, Take 1 tablet (0.5 mg total) by mouth daily as needed., Disp: 30 tablet, Rfl: 2   clopidogrel  (PLAVIX ) 75 MG tablet, Take 1 tablet (75 mg total) by mouth daily., Disp: 90 tablet, Rfl: 3   DULoxetine  (CYMBALTA ) 60 MG capsule, TAKE 1 CAPSULE BY MOUTH EVERY DAY, Disp: 90 capsule, Rfl: 1   empagliflozin   (JARDIANCE ) 10 MG TABS tablet, Take 1 tablet by mouth once daily in the morning, Disp: 30 tablet, Rfl: 5   famotidine  (PEPCID ) 20 MG tablet, Take 1 tablet (20 mg total) by mouth 2 (two) times daily for 14 days., Disp: 28 tablet, Rfl: 0   folic acid  (FOLVITE ) 1 MG tablet, Take 2 tablets (2 mg total) by mouth daily., Disp: 180 tablet, Rfl: 3   Galcanezumab -gnlm (EMGALITY ) 120 MG/ML SOAJ, Inject 120 mg into the skin every 30 (thirty) days., Disp: 1.12 mL, Rfl: 11   guanFACINE (INTUNIV) 1 MG TB24 ER tablet, Take 1 mg by mouth daily., Disp: , Rfl:    hydroxychloroquine  (PLAQUENIL ) 200 MG tablet, Take 1 tablet (200 mg total) by mouth 2 (two) times daily., Disp: 60 tablet, Rfl: 2   insulin  aspart (FIASP  FLEXTOUCH) 100 UNIT/ML FlexTouch Pen, INJECT [place] INSULIN  BEFORE MEALS AND AT HS IF BS < 150 - 0 UNITS, 150-199 - 3 UNITS, 200-249 - 6 UNITS, 250-299 - 9 UNITS, 300-349 - 12 UNITS, >350 - 15 UNITS. IF >400 CALL OUR OFFICE WITH YOUR BLOOD SUGAR READINGS., Disp: 15 mL, Rfl: 1   Insulin  Pen Needle (PEN NEEDLES) 32G X 4 MM MISC, 1 each by Does not apply route once a week., Disp: 30 each, Rfl: 3   Lancets (ONETOUCH DELICA PLUS LANCET33G) MISC, Apply 1 each topically 3 (three) times daily., Disp: , Rfl:    LINZESS  72 MCG capsule, TAKE 1 CAPSULE BY MOUTH DAILY BEFORE BREAKFAST., Disp: 30 capsule, Rfl: 5   methotrexate  (RHEUMATREX) 2.5 MG tablet, TAKE 6 TABLETS (15 MG TOTAL) BY MOUTH ONCE A WEEK. CAUTION:CHEMOTHERAPY. PROTECT FROM LIGHT., Disp: 72 tablet, Rfl: 0   ondansetron  (ZOFRAN ) 4 MG tablet, Take 1 tablet (4 mg total) by mouth every 8 (eight) hours as needed., Disp: 20 tablet, Rfl: 5   ondansetron  (ZOFRAN -ODT) 4 MG disintegrating tablet, Take 1 tablet (4 mg total) by mouth every 8 (eight) hours as needed for nausea., Disp: 10 tablet, Rfl: 0   Polyethyl Glycol-Propyl Glycol (SYSTANE) 0.4-0.3 % SOLN, Place 1 drop into both eyes daily as needed (dry eyes)., Disp: , Rfl:    sacubitril-valsartan (ENTRESTO ) 24-26 MG,  Take 1 tablet by mouth 2 (two) times daily., Disp: 180 tablet, Rfl: 3   tirzepatide  (MOUNJARO ) 10 MG/0.5ML Pen, Inject 10 mg into the skin once a week., Disp: 6 mL, Rfl: 1   topiramate  (TOPAMAX ) 100 MG tablet, Take 2 tablets (  200 mg total) by mouth at bedtime., Disp: 60 tablet, Rfl: 5   Ubrogepant  (UBRELVY ) 100 MG TABS, Take 1 tablet (100 mg total) by mouth as needed. May repeat after 2 hours.  Maximum 2 tablets., Disp: 16 tablet, Rfl: 5   Vitamin D , Ergocalciferol , (DRISDOL ) 1.25 MG (50000 UNIT) CAPS capsule, TAKE 1 CAPSULE BY MOUTH EVERY 7 DAYS **NOT COVERED**, Disp: 12 capsule, Rfl: 0   ADDERALL XR 30 MG 24 hr capsule, Take 30 mg by mouth daily. (Patient not taking: Reported on 12/12/2023), Disp: , Rfl:    eszopiclone  (LUNESTA ) 2 MG TABS tablet, Take 1 tablet (2 mg total) by mouth at bedtime. Take immediately before bedtime, Disp: 30 tablet, Rfl: 5   fluconazole  (DIFLUCAN ) 100 MG tablet, Take 1 tablet (100 mg total) by mouth daily. Take 1 tablet by mouth now repeat in 5 days, Disp: 2 tablet, Rfl: 0   phentermine  (ADIPEX-P ) 37.5 MG tablet, Take 1 tablet (37.5 mg total) by mouth daily before breakfast. (Patient not taking: Reported on 01/17/2024), Disp: 30 tablet, Rfl: 1   spironolactone  (ALDACTONE ) 25 MG tablet, Take 0.5 tablets (12.5 mg total) by mouth daily., Disp: 45 tablet, Rfl: 3   No Known Allergies   Review of Systems  Constitutional: Negative.   Respiratory: Negative.    Cardiovascular: Negative.   Gastrointestinal:  Positive for abdominal pain and nausea. Negative for vomiting.     Today's Vitals   01/17/24 1633  BP: 100/60  Pulse: (!) 104  Temp: 99 F (37.2 C)  TempSrc: Oral  Weight: 187 lb (84.8 kg)  Height: 5\' 6"  (1.676 m)  PainSc: 8   PainLoc: Abdomen   Body mass index is 30.18 kg/m.  Wt Readings from Last 3 Encounters:  01/17/24 187 lb (84.8 kg)  01/16/24 189 lb (85.7 kg)  12/12/23 201 lb (91.2 kg)      Objective:  Physical Exam Vitals and nursing note  reviewed.  Constitutional:      General: She is not in acute distress.    Appearance: Normal appearance. She is well-developed. She is obese.  HENT:     Head: Normocephalic and atraumatic.  Eyes:     Pupils: Pupils are equal, round, and reactive to light.  Cardiovascular:     Rate and Rhythm: Normal rate and regular rhythm.     Pulses: Normal pulses.     Heart sounds: Normal heart sounds. No murmur heard. Pulmonary:     Effort: Pulmonary effort is normal. No respiratory distress.     Breath sounds: Normal breath sounds. No wheezing.  Musculoskeletal:        General: No tenderness.  Skin:    General: Skin is warm and dry.     Capillary Refill: Capillary refill takes less than 2 seconds.  Neurological:     General: No focal deficit present.     Mental Status: She is alert and oriented to person, place, and time.     Cranial Nerves: No cranial nerve deficit.     Motor: No weakness.  Psychiatric:        Mood and Affect: Mood normal.        Behavior: Behavior normal.        Thought Content: Thought content normal.        Judgment: Judgment normal.         Assessment And Plan:  Diverticulosis -     Ambulatory referral to Gastroenterology -     Amoxicillin -Pot Clavulanate; Take 1 tablet by mouth 3 (  three) times daily.  Dispense: 21 tablet; Refill: 0 -     metroNIDAZOLE ; Take 1 tablet (500 mg total) by mouth 4 (four) times daily for 7 days.  Dispense: 28 tablet; Refill: 0  Class 1 obesity due to excess calories with body mass index (BMI) of 30.0 to 30.9 in adult, unspecified whether serious comorbidity present Assessment & Plan: She is encouraged to strive for BMI less than 30 to decrease cardiac risk. Advised to aim for at least 150 minutes of exercise per week while incorporating strengthy training    Abnormal weight loss Assessment & Plan: She has lost 14 lbs since April, however she has not been eating or drinking as normal. Will check for metabolic causes. She has had a  CT scan of abdomen with recent ER visit no masses noted. Will refer to GI for further evaluation due to also having diverticulosis   Orders: -     Ambulatory referral to Gastroenterology -     Iron, TIBC and Ferritin Panel -     TSH  Other fatigue Assessment & Plan: Will check for metabolic cause, but is likely related to her being unable to drink and eat as normal.      Orders: -     Iron, TIBC and Ferritin Panel -     TSH  Type 2 diabetes mellitus with obesity (HCC) Assessment & Plan: Stable. continue current medications.   Orders: -     Hemoglobin A1c  Rash and nonspecific skin eruption -     Ambulatory referral to Dermatology    Return for change appt to Sept.  Patient was given opportunity to ask questions. Patient verbalized understanding of the plan and was able to repeat key elements of the plan. All questions were answered to their satisfaction.    Inge Mangle, FNP, have reviewed all documentation for this visit. The documentation on 01/17/24 for the exam, diagnosis, procedures, and orders are all accurate and complete.   IF YOU HAVE BEEN REFERRED TO A SPECIALIST, IT MAY TAKE 1-2 WEEKS TO SCHEDULE/PROCESS THE REFERRAL. IF YOU HAVE NOT HEARD FROM US /SPECIALIST IN TWO WEEKS, PLEASE GIVE US  A CALL AT [phone].

## 2024-01-18 LAB — IRON,TIBC AND FERRITIN PANEL
Ferritin: 171 ng/mL — ABNORMAL HIGH (ref 15–150)
Iron Saturation: 18 % (ref 15–55)
Iron: 58 ug/dL (ref 27–159)
Total Iron Binding Capacity: 325 ug/dL (ref 250–450)
UIBC: 267 ug/dL (ref 131–425)

## 2024-01-18 LAB — HEMOGLOBIN A1C
Est. average glucose Bld gHb Est-mCnc: 126 mg/dL
Hgb A1c MFr Bld: 6 % — ABNORMAL HIGH (ref 4.8–5.6)

## 2024-01-18 LAB — TSH: TSH: 0.85 u[IU]/mL (ref 0.450–4.500)

## 2024-01-24 ENCOUNTER — Other Ambulatory Visit: Payer: Self-pay | Admitting: Nurse Practitioner

## 2024-01-24 MED ORDER — FLUCONAZOLE 100 MG PO TABS
100.0000 mg | ORAL_TABLET | Freq: Every day | ORAL | 0 refills | Status: AC
Start: 2024-01-24 — End: ?

## 2024-01-24 NOTE — Progress Notes (Unsigned)
 Office Visit Note  Patient: Virginia Douglas             Date of Birth: 1976/03/08           MRN: [id number]             PCP: Susanna Epley, FNP Referring: Susanna Epley, FNP Visit Date: 02/07/2024 Occupation: @GUAROCC @  Subjective:  Medication monitoring   History of Present Illness: Virginia Douglas is a 48 y.o. female with history of seronegative rheumatoid arthritis and osteoarthritis.  Patient remains on Plaquenil  200 mg 1 tablet by mouth twice daily, methotrexate  6 tablets by mouth once weekly, and folic acid  2 mg.  She is tolerating combination therapy without any side effects.  She has noticed over 20% improvement in her symptoms since initiating methotrexate .  She has not had any recent gaps in therapy.  Patient states that about 2 weeks ago she was diagnosed with diverticulitis but denies any other infections since initiating methotrexate .  She continues to have some stiffness in both hands but has been able to increase her activity level.  She has had less difficulty performing ADLs.  Patient states that she is scheduled to follow-up with dermatology in July 2025.  She has been avoiding direct sun exposure.   Activities of Daily Living:  Patient reports stiffness lasting all day  Patient Reports nocturnal pain.  Difficulty dressing/grooming: Reports Difficulty climbing stairs: Reports Difficulty getting out of chair: Reports Difficulty using hands for taps, buttons, cutlery, and/or writing: Reports  Review of Systems  Constitutional:  Positive for fatigue.  HENT:  Positive for mouth sores and mouth dryness.   Eyes:  Positive for dryness.  Cardiovascular:  Positive for palpitations.  Gastrointestinal:  Positive for diarrhea. Negative for blood in stool and constipation.  Endocrine: Positive for increased urination.  Genitourinary:  Positive for involuntary urination.  Musculoskeletal:  Positive for joint pain, gait problem, joint pain, joint swelling, myalgias,  muscle weakness, morning stiffness, muscle tenderness and myalgias.  Skin:  Positive for hair loss and sensitivity to sunlight. Negative for color change and rash.  Allergic/Immunologic: Positive for susceptible to infections.  Neurological:  Positive for dizziness and headaches.  Hematological:  Negative for swollen glands.  Psychiatric/Behavioral:  Positive for sleep disturbance. Negative for depressed mood. The patient is nervous/anxious.     PMFS History:  Patient Active Problem List   Diagnosis Date Noted   Other fatigue 01/17/2024   Abnormal weight loss 01/17/2024   Diverticulosis 01/17/2024   Rash and nonspecific skin eruption 01/17/2024   Influenza vaccination declined 11/02/2023   COVID-19 vaccine administered 11/02/2023   Need for pneumococcal vaccination 11/02/2023   Class 1 obesity due to excess calories with body mass index (BMI) of 30.0 to 30.9 in adult 11/02/2023   Fall 10/18/2023   Acute left ankle pain 10/18/2023   HFrEF (heart failure with reduced ejection fraction) (HCC) 11/09/2022   Type 2 diabetes mellitus with obesity (HCC) 11/08/2022   DKA (diabetic ketoacidosis) (HCC) 10/13/2022   Cerebral venous thrombosis of sigmoid sinus 11/21/2021   Pulsatile tinnitus of right ear 11/21/2021   Pulsatile tinnitus, bilateral 06/27/2021   AVM (arteriovenous malformation) brain 05/31/2021   DDD (degenerative disc disease), cervical 12/06/2020   DDD (degenerative disc disease), lumbar 12/06/2020   Gastroesophageal reflux disease with esophagitis without hemorrhage 12/06/2020   Increased intracranial pressure 12/06/2020   PTSD (post-traumatic stress disorder) 12/06/2020   Attention deficit hyperactivity disorder (ADHD), predominantly inattentive type 12/06/2020   Vitamin D  deficiency 12/06/2020  Obesity (BMI 30-39.9) 03/15/2020   Sciatic nerve pain 12/10/2019   Grief 06/05/2019   Migraine 06/05/2019   Depression 04/07/2019   Obesity due to excess calories without serious  comorbidity 04/07/2019   Anxiety 04/07/2019   Headache 07/06/2018   Benign hypertensive heart disease with congestive heart failure and with combined systolic and diastolic dysfunction (HCC) 12/08/2015   Type 2 diabetes mellitus with obesity (HCC) 12/08/2015   Paresthesia 09/16/2015    Past Medical History:  Diagnosis Date   Acute pain of left shoulder 06/05/2019   ADHD (attention deficit hyperactivity disorder)    Anemia    iron deficiency   as a teenager   Anxiety    Arthritis    Chronic combined systolic and diastolic congestive heart failure (HCC) 11/23/2022   This is a new diagnosis with Cardiology, she is scheduled on 2/14 for her MRI of her heart.     Depression    Diabetes (HCC)    Diverticulitis 01/16/2024   Edema    GERD (gastroesophageal reflux disease)    History of congestive heart failure    dx by cardiology   Hypertension    patient denies- patient stated that the PCP said it was for heart prevention.   Left leg pain 04/07/2019   LLQ pain 12/08/2015   Migraine headache    Urinary urgency 09/16/2015    Family History  Problem Relation Age of Onset   Diabetes Mother    Hypertension Mother    Diabetes Father    Hypertension Father    Kidney failure Father    Anxiety disorder Father    Hypertension Brother    Cancer Maternal Grandmother    Diabetes Maternal Grandmother    Pancreatic cancer Maternal Grandmother    Arthritis Maternal Grandmother    Diabetes Paternal Grandmother    Breast cancer Paternal Grandmother    Stroke Paternal Grandfather    Healthy Son    ADD / ADHD Son    Healthy Son    Asthma Son    Cancer Maternal Aunt        breast   Breast cancer Maternal Aunt    Birth defects Maternal Aunt    Past Surgical History:  Procedure Laterality Date   ABDOMINAL HYSTERECTOMY  2012   CHOLECYSTECTOMY     COLONOSCOPY  12/28/2020   polyps   ESOPHAGEAL DILATION     gamma knife     04/2022   IR ANGIO INTRA EXTRACRAN SEL COM CAROTID INNOMINATE  BILAT MOD SED  05/31/2021   IR ANGIO INTRA EXTRACRAN SEL COM CAROTID INNOMINATE BILAT MOD SED  06/27/2021   IR ANGIO INTRA EXTRACRAN SEL COM CAROTID INNOMINATE UNI R MOD SED  11/21/2021   IR ANGIO VERTEBRAL SEL VERTEBRAL UNI R MOD SED  05/31/2021   IR CT HEAD LTD  06/27/2021   IR CT HEAD LTD  11/21/2021   IR INTRA CRAN STENT  06/27/2021   IR INTRA CRAN STENT  11/21/2021   IR RADIOLOGIST EVAL & MGMT  06/03/2021   IR RADIOLOGIST EVAL & MGMT  07/13/2021   IR RADIOLOGIST EVAL & MGMT  08/27/2021   IR RADIOLOGIST EVAL & MGMT  10/30/2021   IR RADIOLOGIST EVAL & MGMT  12/06/2021   IR US  GUIDE VASC ACCESS RIGHT  05/31/2021   IR US  GUIDE VASC ACCESS RIGHT  06/27/2021   IR US  GUIDE VASC ACCESS RIGHT  11/21/2021   OVARIAN CYST REMOVAL     RADIOLOGY WITH ANESTHESIA N/A 06/27/2021   Procedure:  STENTING;  Surgeon: Luellen Sages, MD;  Location: [facility] OR;  Service: Radiology;  Laterality: N/A;   RADIOLOGY WITH ANESTHESIA N/A 11/21/2021   Procedure: IR WITH ANESTHESIA STENTING;  Surgeon: Luellen Sages, MD;  Location: [facility] OR;  Service: Radiology;  Laterality: N/A;   Social History   Social History Narrative   In relationship, 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 She lives with her 2 children in a one story house. She drinks one large cup of coffee a day and an occasional tea or soda. She walks daily.      One story home      Immunization History  Administered Date(s) Administered   HPV 9-valent 04/11/2019   Hepb-cpg 01/08/2024   Influenza Inj Mdck Quad Pf 05/18/2018   Influenza,inj,Quad PF,6+ Mos 05/20/2017, 05/14/2019, 06/02/2020, 05/09/2022   Influenza-Unspecified 05/11/2017, 05/15/2018, 05/08/2019, 07/13/2021   Janssen (J&J) SARS-COV-2 Vaccination 11/23/2019   PFIZER(Purple Top)SARS-COV-2 Vaccination 07/11/2020   PNEUMOCOCCAL CONJUGATE-20  10/18/2023   Pfizer(Comirnaty)Fall Seasonal Vaccine 12 years and older 01/15/2023, 10/18/2023   Pneumococcal Polysaccharide-23 04/08/2019, 07/15/2019   Tdap 07/02/2017, 04/09/2019     Objective: Vital Signs: BP 108/73 (BP Location: Left Arm, Patient Position: Sitting, Cuff Size: Normal)   Pulse 78   Resp 16   Ht 5' 6.5" (1.689 m)   Wt 183 lb (83 kg)   BMI 29.09 kg/m    Physical Exam Vitals and nursing note reviewed.  Constitutional:      Appearance: She is well-developed.  HENT:     Head: Normocephalic and atraumatic.  Eyes:     Conjunctiva/sclera: Conjunctivae normal.  Cardiovascular:     Rate and Rhythm: Normal rate and regular rhythm.     Heart sounds: Normal heart sounds.  Pulmonary:     Effort: Pulmonary effort is normal.     Breath sounds: Normal breath sounds.  Abdominal:     General: Bowel sounds are normal.     Palpations: Abdomen is soft.  Musculoskeletal:     Cervical back: Normal range of motion.  Lymphadenopathy:     Cervical: No cervical adenopathy.  Skin:    General: Skin is warm and dry.     Capillary Refill: Capillary refill takes less than 2 seconds.  Neurological:     Mental Status: She is alert and oriented to person, place, and time.  Psychiatric:        Behavior: Behavior normal.      Musculoskeletal Exam: C-spine, thoracic spine, lumbar spine and good range of motion.  Shoulder joints, elbow joints, wrist joints, MCPs, PIPs, DIPs have good range of motion with no synovitis.  Complete fist formation bilaterally.  Hip joints have good range of motion with no groin pain.  Knee joints have good range of motion with no warmth or effusion.  Ankle joints have good range of motion with no tenderness or joint swelling.  CDAI Exam: CDAI Score: -- Patient Global: --; Provider Global: -- Swollen: --; Tender: -- Joint Exam 02/07/2024   No joint exam has been documented for this visit   There is currently no information documented on the homunculus. Go  to the Rheumatology activity and complete the homunculus joint exam.  Investigation: No additional findings.  Imaging: CT ABDOMEN PELVIS W CONTRAST Result Date: 01/16/2024 CLINICAL DATA:  Abdominal pain, acute, nonlocalized, vomiting, left-sided rib pain EXAM: CT  ABDOMEN AND PELVIS WITH CONTRAST TECHNIQUE: Multidetector CT imaging of the abdomen and pelvis was performed using the standard protocol following bolus administration of intravenous contrast. RADIATION DOSE REDUCTION: This exam was performed according to the departmental dose-optimization program which includes automated exposure control, adjustment of the mA and/or kV according to patient size and/or use of iterative reconstruction technique. CONTRAST:  OMNIPAQUE  IOHEXOL  300 MG/ML  SOLN COMPARISON:  November 23, 2022 FINDINGS: Lower chest: No focal airspace consolidation or pleural effusion.Multifocal subsegmental atelectasis in the lung bases. Hepatobiliary: No mass. Focal fatty infiltration along the falciform ligament. Cholecystectomy. No intrahepatic or extrahepatic biliary ductal dilation. The portal veins are patent. Pancreas: No mass or main ductal dilation. No peripancreatic inflammation or fluid collection. Spleen: Normal size. No mass. Adrenals/Urinary Tract: No adrenal masses. No renal mass. No nephrolithiasis or hydronephrosis. Partially distended urinary bladder without visualized abnormality. Stomach/Bowel: The stomach is decompressed without focal abnormality. No small bowel wall thickening or inflammation. No small bowel obstruction.Normal appendix. Descending and sigmoid colonic diverticulosis. No changes of acute diverticulitis. Vascular/Lymphatic: No aortic aneurysm. Scattered aortoiliac atherosclerosis. No intraabdominal or pelvic lymphadenopathy. Reproductive: Hysterectomy. No concerning adnexal mass.No free pelvic fluid. Other: No pneumoperitoneum, ascites, or mesenteric inflammation. Couple of surgical clips in the left  lower quadrant omentum. Musculoskeletal: No acute fracture or destructive lesion. Multilevel thoracic osteophytosis. Moderate joint space loss of both hips, suggesting osteoarthritis. IMPRESSION: 1. No acute intraabdominal or pelvic abnormality. 2. Descending and sigmoid colonic diverticulosis. No changes of acute diverticulitis. Aortic Atherosclerosis (ICD10-I70.0). Electronically Signed   By: Rance Burrows M.D.   On: 01/16/2024 20:56    Recent Labs: Lab Results  Component Value Date   WBC 8.9 01/16/2024   HGB 13.4 01/16/2024   PLT 351 01/16/2024   NA 140 01/16/2024   K 3.2 (L) 01/16/2024   CL 110 01/16/2024   CO2 22 01/16/2024   GLUCOSE 83 01/16/2024   BUN 9 01/16/2024   CREATININE 0.58 01/16/2024   BILITOT 0.6 01/16/2024   ALKPHOS 99 01/16/2024   AST 22 01/16/2024   ALT 16 01/16/2024   PROT 8.1 01/16/2024   ALBUMIN 4.5 01/16/2024   CALCIUM  9.7 01/16/2024   GFRAA 89 07/15/2020   QFTBGOLDPLUS NEGATIVE 12/14/2023    Speciality Comments: PLQ Eye Exam:  01/02/2024 WNL @ [facility] Follow up in 1 year   Procedures:  No procedures performed Allergies: Patient has no known allergies.   Assessment / Plan:     Visit Diagnoses: Seronegative rheumatoid arthritis (HCC) - 10/03/23: RF-, Anti-CCP-, 14-3-3 eta-, ESR WNL, CRP WNL.  U/s 09/3023: positive for synovitis: No synovitis was noted on examination today.  She is currently taking methotrexate  6 tablets by mouth once weekly, folic acid  2 mg daily, Plaquenil  200 mg 1 tablet by mouth twice daily.  She is tolerating combination therapy without any side effects and has not had any recent gaps in therapy.  She has noticed over 20% improvement in her symptoms since initiating methotrexate .  She has been able to increase her activity level and has less difficulty performing ADLs.  No medication changes will be made at this time.  She was advised to notify us  if she develops signs or symptoms of a flare.  She will follow-up in the office in  3 months or sooner if needed.  High risk medication use - Plaquenil  200 mg 1 tablet by mouth twice daily, methotrexate  6 tablets by mouth once weekly and folic acid  2 mg CBC and CMP updated  on  01/16/24.  Her next lab work will be due in August and every 3 months to monitor for drug toxicity. PLQ Eye Exam: 01/02/2024 WNL @ [facility] Follow up in 1 year  No recurrent infections.  Discussed the importance of holding methotrexate  if she develops signs or symptoms of an infection and to resume once the infection has completely cleared.  Positive ANA (antinuclear antibody) - ANA 1:80 cytoplasmic, speckled on 03/27/22.  Repeat ANA on 07/12/22 negative.  ANCA negative on 11/06/2023.  Primary osteoarthritis of both hands: Patient continues to experience stiffness in both hands but no active inflammation was noted on examination today.  Discussed the importance of joint protection and muscle strengthening.  Raynaud's disease without gangrene - All autoimmune work-up negative.  Cryoglobulin not detected, beta-2  glycoproteins-, cardiolipin ab-, Scl-70 negative, LA not detected, ANCA and ANA negative.  Chronic pain of both knees: She has good range of motion of both knee joints on examination today.  No warmth or effusion noted.  DDD (degenerative disc disease), cervical: C-spine has good range of motion with no discomfort.  Myofascial pain: Intermittent myalgias and muscle tenderness.    Degeneration of intervertebral disc of lumbar region without discogenic back pain or lower extremity pain - Previously under the care of pain management.  She has a TENS unit which she has been using.  using hemp products and taking Tylenol  as needed for pain relief.  Pain in both feet: Good ROM of both ankles with no tenderness or joint swelling.   Facial rash: Not currently symptomatic.  Patient has an upcoming appointment with dermatology in July 2025.   Photosensitivity: She has been trying to avoid direct UV  exposure.  Other medical conditions are listed as follows:  Essential hypertension: Blood pressure was 108/73 today in the office.  Type 2 diabetes mellitus without complication, without long-term current use of insulin  (HCC)  Increased intracranial pressure  AVM (arteriovenous malformation) brain  Hx of migraine headaches  Attention deficit hyperactivity disorder (ADHD), predominantly inattentive type  Gastroesophageal reflux disease with esophagitis without hemorrhage  PTSD (post-traumatic stress disorder)  Anxiety and depression  History of chronic CHF  Former smoker  Vitamin D  deficiency  Other fatigue  Cerebral venous thrombosis of sigmoid sinus  Orders: No orders of the defined types were placed in this encounter.  No orders of the defined types were placed in this encounter.    Follow-Up Instructions: Return in about 3 months (around 05/09/2024).   Romayne Clubs, PA-C  Note - This record has been created using Dragon software.  Chart creation errors have been sought, but may not always  have been located. Such creation errors do not reflect on  the standard of medical care.

## 2024-01-27 ENCOUNTER — Ambulatory Visit: Payer: Self-pay | Admitting: Nurse Practitioner

## 2024-01-27 NOTE — Assessment & Plan Note (Signed)
 She has lost 14 lbs since April, however she has not been eating or drinking as normal. Will check for metabolic causes. She has had a CT scan of abdomen with recent ER visit no masses noted. Will refer to GI for further evaluation due to also having diverticulosis

## 2024-01-27 NOTE — Assessment & Plan Note (Signed)
 Will check for metabolic cause, but is likely related to her being unable to drink and eat as normal.

## 2024-01-27 NOTE — Assessment & Plan Note (Signed)
 She is encouraged to strive for BMI less than 30 to decrease cardiac risk. Advised to aim for at least 150 minutes of exercise per week while incorporating strengthy training

## 2024-01-27 NOTE — Assessment & Plan Note (Signed)
 Will treat with antibiotic since still has abdomen pain. Seen in ER yesterday CT scan abdomen confirmed diverticulosis. Educated on diet to avoid diverticulosis flares.

## 2024-01-27 NOTE — Assessment & Plan Note (Signed)
 Stable continue current medications

## 2024-01-29 ENCOUNTER — Other Ambulatory Visit: Payer: Self-pay | Admitting: Nurse Practitioner

## 2024-01-29 MED ORDER — TRAMADOL HCL 50 MG PO TABS: 50.0000 mg | ORAL_TABLET | Freq: Four times a day (QID) | ORAL | 0 refills | Status: DC | PRN

## 2024-02-05 MED ORDER — HYDROXYCHLOROQUINE SULFATE 200 MG PO TABS: 200.0000 mg | ORAL_TABLET | Freq: Two times a day (BID) | ORAL | 0 refills | Status: AC

## 2024-02-05 NOTE — Telephone Encounter (Signed)
 Last Fill: 10/16/2023  Eye exam: 01/02/2024 WNL    Labs: 01/16/2024 Potassium 3.2 RDW 15.9  Next Visit: 02/07/2024  Last Visit: 12/12/2023  [id number] rheumatoid arthritis   Current Dose per office note 12/12/2023: Plaquenil  200 mg 1 tablet by mouth twice daily   Okay to refill Plaquenil ?

## 2024-02-07 ENCOUNTER — Encounter: Payer: Self-pay | Admitting: Physician Assistant

## 2024-02-07 ENCOUNTER — Ambulatory Visit: Attending: Physician Assistant | Admitting: Physician Assistant

## 2024-02-07 ENCOUNTER — Encounter: Payer: Self-pay | Admitting: Neurology

## 2024-02-07 VITALS — BP 108/73 | HR 78 | Resp 16 | Ht 66.5 in | Wt 183.0 lb

## 2024-02-07 DIAGNOSIS — Z8679 Personal history of other diseases of the circulatory system: Secondary | ICD-10-CM

## 2024-02-07 DIAGNOSIS — F4310 Post-traumatic stress disorder, unspecified: Secondary | ICD-10-CM

## 2024-02-07 DIAGNOSIS — M51369 Other intervertebral disc degeneration, lumbar region without mention of lumbar back pain or lower extremity pain: Secondary | ICD-10-CM

## 2024-02-07 DIAGNOSIS — M19041 Primary osteoarthritis, right hand: Secondary | ICD-10-CM

## 2024-02-07 DIAGNOSIS — M25561 Pain in right knee: Secondary | ICD-10-CM

## 2024-02-07 DIAGNOSIS — F32A Depression, unspecified: Secondary | ICD-10-CM

## 2024-02-07 DIAGNOSIS — L568 Other specified acute skin changes due to ultraviolet radiation: Secondary | ICD-10-CM

## 2024-02-07 DIAGNOSIS — M79672 Pain in left foot: Secondary | ICD-10-CM

## 2024-02-07 DIAGNOSIS — G8929 Other chronic pain: Secondary | ICD-10-CM

## 2024-02-07 DIAGNOSIS — M5030 Other cervical disc degeneration, unspecified cervical region: Secondary | ICD-10-CM

## 2024-02-07 DIAGNOSIS — R21 Rash and other nonspecific skin eruption: Secondary | ICD-10-CM

## 2024-02-07 DIAGNOSIS — M19042 Primary osteoarthritis, left hand: Secondary | ICD-10-CM

## 2024-02-07 DIAGNOSIS — G932 Benign intracranial hypertension: Secondary | ICD-10-CM

## 2024-02-07 DIAGNOSIS — R768 Other specified abnormal immunological findings in serum: Secondary | ICD-10-CM

## 2024-02-07 DIAGNOSIS — K2100 Gastro-esophageal reflux disease with esophagitis, without bleeding: Secondary | ICD-10-CM

## 2024-02-07 DIAGNOSIS — G08 Intracranial and intraspinal phlebitis and thrombophlebitis: Secondary | ICD-10-CM

## 2024-02-07 DIAGNOSIS — F419 Anxiety disorder, unspecified: Secondary | ICD-10-CM

## 2024-02-07 DIAGNOSIS — E559 Vitamin D deficiency, unspecified: Secondary | ICD-10-CM

## 2024-02-07 DIAGNOSIS — M79671 Pain in right foot: Secondary | ICD-10-CM

## 2024-02-07 DIAGNOSIS — I10 Essential (primary) hypertension: Secondary | ICD-10-CM

## 2024-02-07 DIAGNOSIS — M0600 Rheumatoid arthritis without rheumatoid factor, unspecified site: Secondary | ICD-10-CM | POA: Diagnosis not present

## 2024-02-07 DIAGNOSIS — Z79899 Other long term (current) drug therapy: Secondary | ICD-10-CM

## 2024-02-07 DIAGNOSIS — M25562 Pain in left knee: Secondary | ICD-10-CM

## 2024-02-07 DIAGNOSIS — R5383 Other fatigue: Secondary | ICD-10-CM

## 2024-02-07 DIAGNOSIS — Z87891 Personal history of nicotine dependence: Secondary | ICD-10-CM

## 2024-02-07 DIAGNOSIS — Q282 Arteriovenous malformation of cerebral vessels: Secondary | ICD-10-CM

## 2024-02-07 DIAGNOSIS — M7918 Myalgia, other site: Secondary | ICD-10-CM

## 2024-02-07 DIAGNOSIS — I7300 Raynaud's syndrome without gangrene: Secondary | ICD-10-CM

## 2024-02-07 DIAGNOSIS — E119 Type 2 diabetes mellitus without complications: Secondary | ICD-10-CM

## 2024-02-07 DIAGNOSIS — F900 Attention-deficit hyperactivity disorder, predominantly inattentive type: Secondary | ICD-10-CM

## 2024-02-07 DIAGNOSIS — Z8669 Personal history of other diseases of the nervous system and sense organs: Secondary | ICD-10-CM

## 2024-02-07 NOTE — Patient Instructions (Signed)
 Standing Labs We placed an order today for your standing lab work.   Please have your standing labs drawn in August and every 3 months   Please have your labs drawn 2 weeks prior to your appointment so that the provider can discuss your lab results at your appointment, if possible.  Please note that you may see your imaging and lab results in MyChart before we have reviewed them. We will contact you once all results are reviewed. Please allow our office up to 72 hours to thoroughly review all of the results before contacting the office for clarification of your results.  WALK-IN LAB HOURS  Monday through Thursday from 8:00 am -12:30 pm and 1:00 pm-4:00 pm and Friday from 8:00 am-12:00 pm.  Patients with office visits requiring labs will be seen before walk-in labs.  You may encounter longer than normal wait times. Please allow additional time. Wait times may be shorter on  Monday and Thursday afternoons.  We do not book appointments for walk-in labs. We appreciate your patience and understanding with our staff.   Labs are drawn by [facility]. Please bring your co-pay at the time of your lab draw.  You may receive a bill from [facility] for your lab work.  Please note if you are on Hydroxychloroquine  and and an order has been placed for a Hydroxychloroquine  level,  you will need to have it drawn 4 hours or more after your last dose.  If you wish to have your labs drawn at another location, please call the office 24 hours in advance so we can fax the orders.  The office is located at [address], [address], [place], [place] [address]   If you have any questions regarding directions or hours of operation,  please call [phone].   As a reminder, please drink plenty of water prior to coming for your lab work. Thanks!

## 2024-02-25 ENCOUNTER — Telehealth: Payer: Self-pay | Admitting: Neurology

## 2024-02-25 NOTE — Telephone Encounter (Signed)
 On your desk

## 2024-02-25 NOTE — Telephone Encounter (Signed)
 Received surgery clearance form for this patient. Form is in Dr. Jayme box

## 2024-02-26 NOTE — Telephone Encounter (Signed)
 Per DR.Jaffe note, This needs to be filled out by the prescribing provider for the Plavix . I believe she is taking this from endovascular radiology because of a cerebral sinus stent.    [facility] advised.

## 2024-02-27 ENCOUNTER — Ambulatory Visit: Payer: No Typology Code available for payment source | Admitting: Nurse Practitioner

## 2024-02-29 ENCOUNTER — Encounter (HOSPITAL_COMMUNITY): Payer: Self-pay | Admitting: Interventional Radiology

## 2024-03-04 ENCOUNTER — Ambulatory Visit: Admitting: Physician Assistant

## 2024-03-04 ENCOUNTER — Encounter: Payer: Self-pay | Admitting: Physician Assistant

## 2024-03-04 VITALS — BP 115/68

## 2024-03-04 DIAGNOSIS — L858 Other specified epidermal thickening: Secondary | ICD-10-CM | POA: Diagnosis not present

## 2024-03-04 DIAGNOSIS — D1801 Hemangioma of skin and subcutaneous tissue: Secondary | ICD-10-CM

## 2024-03-04 DIAGNOSIS — D1800 Hemangioma unspecified site: Secondary | ICD-10-CM

## 2024-03-04 DIAGNOSIS — D692 Other nonthrombocytopenic purpura: Secondary | ICD-10-CM

## 2024-03-04 NOTE — Progress Notes (Signed)
   New Patient Visit   Subjective  Virginia Douglas is a 48 y.o. female who presents for the following: She had a rash that comes and goes of her right leg a couple of months ago that was itchy. It has since gone away. It looks like a bruise when it comes up. She has had it at least 3 times. She went to the ER and they ruled out a DVT. Patient has photos on her phone. In addition, she has a spot on her left forearm that has been there for months and it has gotten bigger. It is not painful or itchy.   The following portions of the chart were reviewed this encounter and updated as appropriate: medications, allergies, medical history  Review of Systems:  No other skin or systemic complaints except as noted in HPI or Assessment and Plan.  Objective  Well appearing patient in no apparent distress; mood and affect are within normal limits.   A focused examination was performed of the following areas: Face, legs, and arms.    Relevant exam findings are noted in the Assessment and Plan.    Assessment & Plan   Hemangioma - left forearm Exam: red papule(s) Discussed benign nature. Recommend observation. Call for changes.  KERATOSIS PILARIS - Tiny follicular keratotic papules - Benign. Genetic in nature. No cure. - Observe. - If desired, patient can use an emollient (moisturizer) containing ammonium lactate (AmLactin), urea or salicylic acid once a day to smooth the area  Recommend starting moisturizer with exfoliant (Urea, Salicylic acid, or Lactic acid) one to two times daily to help smooth rough and bumpy skin.  OTC options include Cetaphil Rough and Bumpy lotion (Urea), Eucerin Roughness Relief lotion or spot treatment cream (Urea), CeraVe SA lotion/cream for Rough and Bumpy skin (Sal Acid), Gold Bond Rough and Bumpy cream (Sal Acid), and AmLactin 12% lotion/cream (Lactic Acid).  If applying in morning, also apply sunscreen to sun-exposed areas, since these exfoliating moisturizers  can increase sensitivity to sun.   HISTORY OF POSSIBLE PURPURA OF LEFT THIGH Exam: Clear today  Treatment Plan: Call office if recurs.    OTHER NONTHROMBOCYTOPENIC PURPURA (HCC)   KERATOSIS PILARIS   HEMANGIOMA, UNSPECIFIED SITE    No follow-ups on file.  I, Roseline Hutchinson, CMA, am acting as scribe for Simone Rodenbeck K, PA-C .   Documentation: I have reviewed the above documentation for accuracy and completeness, and I agree with the above.  Kieara Schwark K, PA-C

## 2024-03-13 NOTE — Telephone Encounter (Signed)
 Ok to schedule visit to discuss treatment alternatives or she can discontinue methotrexate  and monitor symptoms closely while on plaquenil 

## 2024-03-13 NOTE — Telephone Encounter (Signed)
 Please call to clarify if she has noticed any benefit since starting methotrexate ?  We could try reducing the dose of methotrexate  to 4 tablets weekly for less immunosuppression.

## 2024-03-17 ENCOUNTER — Emergency Department (HOSPITAL_COMMUNITY)

## 2024-03-17 ENCOUNTER — Other Ambulatory Visit: Payer: Self-pay

## 2024-03-17 ENCOUNTER — Emergency Department (HOSPITAL_COMMUNITY)
Admission: EM | Admit: 2024-03-17 | Discharge: 2024-03-17 | Disposition: A | Discharge status: Home / Self Care | Attending: Emergency Medicine | Admitting: Emergency Medicine

## 2024-03-17 ENCOUNTER — Encounter (HOSPITAL_COMMUNITY): Payer: Self-pay | Admitting: Emergency Medicine

## 2024-03-17 DIAGNOSIS — Z7982 Long term (current) use of aspirin: Secondary | ICD-10-CM | POA: Diagnosis not present

## 2024-03-17 DIAGNOSIS — Z7984 Long term (current) use of oral hypoglycemic drugs: Secondary | ICD-10-CM | POA: Insufficient documentation

## 2024-03-17 DIAGNOSIS — E119 Type 2 diabetes mellitus without complications: Secondary | ICD-10-CM | POA: Diagnosis not present

## 2024-03-17 DIAGNOSIS — R0789 Other chest pain: Secondary | ICD-10-CM | POA: Insufficient documentation

## 2024-03-17 DIAGNOSIS — I509 Heart failure, unspecified: Secondary | ICD-10-CM | POA: Diagnosis not present

## 2024-03-17 DIAGNOSIS — Z79899 Other long term (current) drug therapy: Secondary | ICD-10-CM | POA: Diagnosis not present

## 2024-03-17 DIAGNOSIS — Z794 Long term (current) use of insulin: Secondary | ICD-10-CM | POA: Diagnosis not present

## 2024-03-17 DIAGNOSIS — I110 Hypertensive heart disease with heart failure: Secondary | ICD-10-CM | POA: Diagnosis not present

## 2024-03-17 DIAGNOSIS — R079 Chest pain, unspecified: Secondary | ICD-10-CM

## 2024-03-17 LAB — COMPREHENSIVE METABOLIC PANEL WITH GFR
ALT: 14 U/L (ref 0–44)
AST: 22 U/L (ref 15–41)
Albumin: 4.3 g/dL (ref 3.5–5.0)
Alkaline Phosphatase: 96 U/L (ref 38–126)
Anion gap: 13 (ref 5–15)
BUN: 7 mg/dL (ref 6–20)
CO2: 19 mmol/L — ABNORMAL LOW (ref 22–32)
Calcium: 9.8 mg/dL (ref 8.9–10.3)
Chloride: 110 mmol/L (ref 98–111)
Creatinine, Ser: 0.62 mg/dL (ref 0.44–1.00)
GFR, Estimated: >60 mL/min (ref >60)
Glucose, Bld: 88 mg/dL (ref 70–99)
Potassium: 3.5 mmol/L (ref 3.5–5.1)
Sodium: 142 mmol/L (ref 135–145)
Total Bilirubin: 0.5 mg/dL (ref 0.0–1.2)
Total Protein: 7.8 g/dL (ref 6.5–8.1)

## 2024-03-17 LAB — RAPID URINE DRUG SCREEN, HOSP PERFORMED
Amphetamines: NOT DETECTED
Barbiturates: NOT DETECTED
Benzodiazepines: NOT DETECTED
Cocaine: NOT DETECTED
Opiates: NOT DETECTED
Tetrahydrocannabinol: POSITIVE — AB

## 2024-03-17 LAB — URINALYSIS, ROUTINE W REFLEX MICROSCOPIC
Bacteria, UA: NONE SEEN
Bilirubin Urine: NEGATIVE
Glucose, UA: NEGATIVE mg/dL
Hgb urine dipstick: NEGATIVE
Ketones, ur: NEGATIVE mg/dL
Nitrite: NEGATIVE
Protein, ur: NEGATIVE mg/dL
Specific Gravity, Urine: >1.046 — ABNORMAL HIGH (ref 1.005–1.030)
pH: 5 (ref 5.0–8.0)

## 2024-03-17 LAB — CBC WITH DIFFERENTIAL/PLATELET
Abs Immature Granulocytes: 0.03 K/uL (ref 0.00–0.07)
Basophils Absolute: 0.1 K/uL (ref 0.0–0.1)
Basophils Relative: 1 %
Eosinophils Absolute: 0.2 K/uL (ref 0.0–0.5)
Eosinophils Relative: 2 %
HCT: 39.5 % (ref 36.0–46.0)
Hemoglobin: 12.5 g/dL (ref 12.0–15.0)
Immature Granulocytes: 0 %
Lymphocytes Relative: 36 %
Lymphs Abs: 2.8 K/uL (ref 0.7–4.0)
MCH: 28.3 pg (ref 26.0–34.0)
MCHC: 31.6 g/dL (ref 30.0–36.0)
MCV: 89.4 fL (ref 80.0–100.0)
Monocytes Absolute: 0.4 K/uL (ref 0.1–1.0)
Monocytes Relative: 5 %
Neutro Abs: 4.3 K/uL (ref 1.7–7.7)
Neutrophils Relative %: 56 %
Platelets: 255 K/uL (ref 150–400)
RBC: 4.42 MIL/uL (ref 3.87–5.11)
RDW: 16.7 % — ABNORMAL HIGH (ref 11.5–15.5)
WBC: 7.8 K/uL (ref 4.0–10.5)
nRBC: 0 % (ref 0.0–0.2)

## 2024-03-17 LAB — TROPONIN I (HIGH SENSITIVITY)
Troponin I (High Sensitivity): 3 ng/L (ref <18)
Troponin I (High Sensitivity): 2 ng/L (ref <18)

## 2024-03-17 LAB — BRAIN NATRIURETIC PEPTIDE: B Natriuretic Peptide: 8 pg/mL (ref 0.0–100.0)

## 2024-03-17 LAB — LIPASE, BLOOD: Lipase: 40 U/L (ref 11–51)

## 2024-03-17 LAB — MAGNESIUM: Magnesium: 2 mg/dL (ref 1.7–2.4)

## 2024-03-17 MED ORDER — IOHEXOL 300 MG/ML  SOLN: 75.0000 mL | Freq: Once | INTRAMUSCULAR | Status: DC | PRN

## 2024-03-17 MED ORDER — ASPIRIN 81 MG PO CHEW
324.0000 mg | CHEWABLE_TABLET | Freq: Once | ORAL | Status: AC
  Administered 2024-03-17: 324 mg via ORAL
  Filled 2024-03-17: qty 4

## 2024-03-17 MED ORDER — LIDOCAINE VISCOUS HCL 2 % MT SOLN
15.0000 mL | Freq: Once | OROMUCOSAL | Status: AC
  Administered 2024-03-17: 15 mL via ORAL
  Filled 2024-03-17: qty 15

## 2024-03-17 MED ORDER — IOHEXOL 350 MG/ML SOLN
75.0000 mL | Freq: Once | INTRAVENOUS | Status: AC | PRN
  Administered 2024-03-17: 75 mL via INTRAVENOUS

## 2024-03-17 MED ORDER — FAMOTIDINE IN NACL 20-0.9 MG/50ML-% IV SOLN
20.0000 mg | Freq: Once | INTRAVENOUS | Status: AC
  Administered 2024-03-17: 20 mg via INTRAVENOUS
  Filled 2024-03-17: qty 50

## 2024-03-17 MED ORDER — ALUM & MAG HYDROXIDE-SIMETH 200-200-20 MG/5ML PO SUSP
30.0000 mL | Freq: Once | ORAL | Status: AC
  Administered 2024-03-17: 30 mL via ORAL
  Filled 2024-03-17: qty 30

## 2024-03-17 MED ORDER — FENTANYL CITRATE PF 50 MCG/ML IJ SOSY
50.0000 ug | PREFILLED_SYRINGE | Freq: Once | INTRAMUSCULAR | Status: AC
  Administered 2024-03-17: 50 ug via INTRAVENOUS
  Filled 2024-03-17: qty 1

## 2024-03-17 NOTE — ED Provider Notes (Signed)
 [facility] Provider Note   CSN: [id number] Arrival date & time: 03/17/24  9370     Patient presents with: Chest Pain   Virginia Douglas is a 48 y.o. female.    Chest Pain Patient presents for chest pain.  Medical history includes HTN, DM, anxiety, depression, arthritis, GERD, CHF, migraines.  Her cardiologist is Dr. Marlyn.  Last echocardiogram was 1.5 years ago.  EF at that time was 30 to 35%.  Home medications include Coreg , Jardiance , Entresto , spironolactone .  Recent chest pain is described as left-sided with radiation to left arm with intermittent left arm numbness.  Symptoms have been intermittent over the past 2 days.  Frequency and severity has worsened.  She endorses mild shortness of breath.  This morning, at 5:30 AM, she was awakened by recurrence of symptoms.  The symptoms have been persistent since that time.  She reports adherence to her home medications.  Pain in left arm is described as an aching as well as a tingling.     Prior to Admission medications   Medication Sig Start Date End Date Taking? Authorizing Provider  ADDERALL XR 30 MG 24 hr capsule Take 30 mg by mouth daily. Patient not taking: Reported on 12/12/2023 09/29/20   [provider]  albuterol  (VENTOLIN  HFA) 108 (90 Base) MCG/ACT inhaler Inhale 2 puffs into the lungs as needed. Patient not taking: Reported on 02/07/2024 12/12/22   [provider]  amoxicillin -clavulanate (AUGMENTIN ) 875-125 MG tablet Take 1 tablet by mouth 3 (three) times daily. Patient not taking: Reported on 02/07/2024 01/17/24   Georgina Speaks, FNP  aspirin  EC 81 MG tablet Take 81 mg by mouth daily. Swallow whole.    [provider]  atorvastatin  (LIPITOR) 10 MG tablet TAKE 1 TABLET BY MOUTH EVERY DAY 02/07/23   Georgina Speaks, FNP  Blood Pressure Monitoring Soln KIT  01/01/23   [provider]  buPROPion  (WELLBUTRIN  XL) 150 MG 24 hr tablet Take 1 tablet (150 mg  total) by mouth daily. 10/18/23   Georgina Speaks, FNP  carvedilol  (COREG ) 3.125 MG tablet Take 1 tablet (3.125 mg total) by mouth 2 (two) times daily. 08/08/23   Kate Lonni CROME, MD  cetirizine (ZYRTEC) 10 MG tablet Take 10 mg by mouth daily as needed for allergies.    [provider]  clonazePAM  (KLONOPIN ) 0.5 MG tablet Take 1 tablet (0.5 mg total) by mouth daily as needed. 10/18/23   Georgina Speaks, FNP  clopidogrel  (PLAVIX ) 75 MG tablet Take 1 tablet (75 mg total) by mouth daily. 02/08/23   Neita Kirsch A, PA-C  DULoxetine  (CYMBALTA ) 60 MG capsule TAKE 1 CAPSULE BY MOUTH EVERY DAY 12/31/23   Leigh Venetia CROME, MD  empagliflozin  (JARDIANCE ) 10 MG TABS tablet Take 1 tablet by mouth once daily in the morning 10/12/22   Kate Lonni CROME, MD  eszopiclone  (LUNESTA ) 2 MG TABS tablet Take 1 tablet (2 mg total) by mouth at bedtime. Take immediately before bedtime 01/15/23 02/07/24  Moore, Janece, FNP  famotidine  (PEPCID ) 20 MG tablet Take 1 tablet (20 mg total) by mouth 2 (two) times daily for 14 days. Patient taking differently: Take 20 mg by mouth as needed. 01/16/24 02/07/24  Hoy Nidia FALCON, PA-C  fluconazole  (DIFLUCAN ) 100 MG tablet Take 1 tablet (100 mg total) by mouth daily. Take 1 tablet by mouth now repeat in 5 days Patient not taking: Reported on 02/07/2024 01/24/24   Georgina Speaks, FNP  folic acid  (FOLVITE ) 1 MG  tablet Take 2 tablets (2 mg total) by mouth daily. 12/18/23   Cheryl Waddell HERO, PA-C  Galcanezumab -gnlm (EMGALITY ) 120 MG/ML SOAJ Inject 120 mg into the skin every 30 (thirty) days. 05/17/23   Skeet Juliene SAUNDERS, DO  guanFACINE (INTUNIV) 1 MG TB24 ER tablet Take 1 mg by mouth daily. Patient not taking: Reported on 02/07/2024 12/07/22   [provider]  hydroxychloroquine  (PLAQUENIL ) 200 MG tablet Take 1 tablet (200 mg total) by mouth 2 (two) times daily. 02/05/24   Cheryl Waddell HERO, PA-C  insulin  aspart (FIASP  FLEXTOUCH) 100 UNIT/ML FlexTouch Pen INJECT [place] INSULIN  BEFORE MEALS AND  AT HS IF BS < 150 - 0 UNITS, 150-199 - 3 UNITS, 200-249 - 6 UNITS, 250-299 - 9 UNITS, 300-349 - 12 UNITS, >350 - 15 UNITS. IF >400 CALL OUR OFFICE WITH YOUR BLOOD SUGAR READINGS. 04/03/23   Georgina Speaks, FNP  Insulin  Pen Needle (PEN NEEDLES) 32G X 4 MM MISC 1 each by Does not apply route once a week. 12/08/22   Georgina Speaks, FNP  Lancets [facility] DELICA PLUS [place]) MISC Apply 1 each topically 3 (three) times daily. 12/05/22   [provider]  LINZESS  72 MCG capsule TAKE 1 CAPSULE BY MOUTH DAILY BEFORE BREAKFAST. 01/12/23   Georgina Speaks, FNP  methotrexate  (RHEUMATREX) 2.5 MG tablet TAKE 6 TABLETS (15 MG TOTAL) BY MOUTH ONCE A WEEK. CAUTION:CHEMOTHERAPY. PROTECT FROM LIGHT. 01/08/24   Dolphus Reiter, MD  ondansetron  (ZOFRAN ) 4 MG tablet Take 1 tablet (4 mg total) by mouth every 8 (eight) hours as needed. 11/15/23   Skeet, Adam R, DO  ondansetron  (ZOFRAN -ODT) 4 MG disintegrating tablet Take 1 tablet (4 mg total) by mouth every 8 (eight) hours as needed for nausea. 01/16/24   Hoy Nidia FALCON, PA-C  phentermine  (ADIPEX-P ) 37.5 MG tablet Take 1 tablet (37.5 mg total) by mouth daily before breakfast. Patient not taking: Reported on 12/12/2023 01/15/23   Moore, Janece, FNP  Polyethyl Glycol-Propyl Glycol (SYSTANE) 0.4-0.3 % SOLN Place 1 drop into both eyes daily as needed (dry eyes).    [provider]  sacubitril-valsartan (ENTRESTO ) 24-26 MG Take 1 tablet by mouth 2 (two) times daily. 02/28/23   Kate Lonni CROME, MD  spironolactone  (ALDACTONE ) 25 MG tablet Take 0.5 tablets (12.5 mg total) by mouth daily. 11/06/22 02/07/24  Kate Lonni CROME, MD  tirzepatide  (MOUNJARO ) 10 MG/0.5ML Pen Inject 10 mg into the skin once a week. 10/18/23   Georgina Speaks, FNP  topiramate  (TOPAMAX ) 100 MG tablet Take 2 tablets (200 mg total) by mouth at bedtime. 11/15/23   Skeet Juliene SAUNDERS, DO  traMADol  (ULTRAM ) 50 MG tablet Take 1 tablet (50 mg total) by mouth every 6 (six) hours as needed. Patient not  taking: Reported on 02/07/2024 01/29/24 01/28/25  Georgina Speaks, FNP  Ubrogepant  (UBRELVY ) 100 MG TABS Take 1 tablet (100 mg total) by mouth as needed. May repeat after 2 hours.  Maximum 2 tablets. 11/15/23   Jaffe, Adam R, DO  Vitamin D , Ergocalciferol , (DRISDOL ) 1.25 MG (50000 UNIT) CAPS capsule TAKE 1 CAPSULE BY MOUTH EVERY 7 DAYS **NOT COVERED** 10/25/23   Georgina Speaks, FNP  metFORMIN  (GLUCOPHAGE -XR) 500 MG 24 hr tablet Take 2 tablets (1,000 mg total) by mouth 2 (two) times daily. Patient not taking: Reported on 06/05/2019 03/10/16 08/15/19  Darra Fonda MATSU, MD  Phentermine -Topiramate  11.25-69 MG CP24 Take 1 tablet by mouth daily. 02/04/19 04/07/19  Moore, Janece, FNP    Allergies: Patient has no known allergies.    Review of  Systems  Cardiovascular:  Positive for chest pain.  Musculoskeletal:  Positive for myalgias.  All other systems reviewed and are negative.   Updated Vital Signs BP 112/70   Pulse 90   Temp 98.5 F (36.9 C) (Oral)   Resp 13   SpO2 98%   Physical Exam Vitals and nursing note reviewed.  Constitutional:      General: She is not in acute distress.    Appearance: She is well-developed. She is not ill-appearing, toxic-appearing or diaphoretic.  HENT:     Head: Normocephalic and atraumatic.  Eyes:     Conjunctiva/sclera: Conjunctivae normal.  Cardiovascular:     Rate and Rhythm: Normal rate and regular rhythm.     Heart sounds: No murmur heard. Pulmonary:     Effort: Pulmonary effort is normal. No respiratory distress.     Breath sounds: Normal breath sounds. No decreased breath sounds, wheezing, rhonchi or rales.  Abdominal:     Palpations: Abdomen is soft.     Tenderness: There is no abdominal tenderness.  Musculoskeletal:        General: No swelling. Normal range of motion.     Cervical back: Normal range of motion and neck supple.  Skin:    General: Skin is warm and dry.     Capillary Refill: Capillary refill takes less than 2 seconds.     Coloration: Skin is  not cyanotic or pale.  Neurological:     General: No focal deficit present.     Mental Status: She is alert and oriented to person, place, and time.     Cranial Nerves: No dysarthria or facial asymmetry.     Sensory: Sensory deficit present.     Motor: Weakness present. No pronator drift.     Coordination: Finger-Nose-Finger Test abnormal.  Psychiatric:        Mood and Affect: Mood normal.        Behavior: Behavior normal.     (all labs ordered are listed, but only abnormal results are displayed) Labs Reviewed  COMPREHENSIVE METABOLIC PANEL WITH GFR - Abnormal; Notable for the following components:      Result Value   CO2 19 (*)    All other components within normal limits  CBC WITH DIFFERENTIAL/PLATELET - Abnormal; Notable for the following components:   RDW 16.7 (*)    All other components within normal limits  LIPASE, BLOOD  MAGNESIUM  BRAIN NATRIURETIC PEPTIDE  RAPID URINE DRUG SCREEN, HOSP PERFORMED  URINALYSIS, ROUTINE W REFLEX MICROSCOPIC  TROPONIN I (HIGH SENSITIVITY)  TROPONIN I (HIGH SENSITIVITY)    EKG: EKG Interpretation Date/Time:  Monday March 17 2024 07:28:41 EDT Ventricular Rate:  80 PR Interval:  220 QRS Duration:  96 QT Interval:  398 QTC Calculation: 460 R Axis:   46  Text Interpretation: Sinus rhythm Prolonged PR interval Confirmed by Melvenia Motto ([id number]) on 03/17/2024 8:09:02 AM  Radiology: CT ANGIO HEAD NECK W WO CM Result Date: 03/17/2024 CLINICAL DATA:  Initial evaluation for acute neuro deficit, stroke suspected. EXAM: CT ANGIOGRAPHY HEAD AND NECK WITH AND WITHOUT CONTRAST TECHNIQUE: Multidetector CT imaging of the head and neck was performed using the standard protocol during bolus administration of intravenous contrast. Multiplanar CT image reconstructions and MIPs were obtained to evaluate the vascular anatomy. Carotid stenosis measurements (when applicable) are obtained utilizing NASCET criteria, using the distal internal carotid diameter as the  denominator. RADIATION DOSE REDUCTION: This exam was performed according to the departmental dose-optimization program which includes automated exposure control, adjustment  of the mA and/or kV according to patient size and/or use of iterative reconstruction technique. CONTRAST:  75mL OMNIPAQUE  IOHEXOL  350 MG/ML SOLN COMPARISON:  None Available. FINDINGS: CT HEAD FINDINGS Brain: Cerebral volume within normal limits for patient age. No acute intracranial hemorrhage. No acute large vessel territory infarct. No mass lesion, midline shift, or mass effect. Ventricles are normal in size without hydrocephalus. No extra-axial fluid collection. Empty sella again noted. Vascular: No abnormal hyperdense vessel. Skull: Scalp soft tissues demonstrate no acute abnormality. Calvarium intact. Sinuses/Orbits: Globes and orbital soft tissues within normal limits. Visualized paranasal sinuses are largely clear. No significant mastoid effusion. CTA NECK FINDINGS Aortic arch: Standard branching. Imaged portion shows no evidence of aneurysm or dissection. No significant stenosis of the major arch vessel origins. Right carotid system: No evidence of dissection, stenosis (50% or greater), or occlusion. Left carotid system: No evidence of dissection, stenosis (50% or greater), or occlusion. Vertebral arteries: No evidence of dissection, stenosis (50% or greater), or occlusion. Skeleton: No discrete or worrisome osseous lesions. Other neck: No other acute finding. Upper chest: No other acute finding. Review of the MIP images confirms the above findings CTA HEAD FINDINGS Anterior circulation: Stenosis. Left vertebral artery dominant. Both PICA patent. Basilar patent without stenosis. Superior cerebellar and posterior cerebral arteries patent bilaterally. Posterior circulation:  Both V4 segments patent without significant Venous sinuses: Patent allowing for timing the contrast bolus. Vascular stents in place within the distal transverse sinuses  bilaterally. Patent flow through the stents. No residual or recurrent dural venous sinus stenosis. Anatomic variants: None significant.  No aneurysm. Review of the MIP images confirms the above findings IMPRESSION: 1. Normal CTA of the head and neck. No large vessel occlusion or other emergent finding. No hemodynamically significant or correctable stenosis. 2. No other acute intracranial abnormality. 3. Vascular stents in place within the distal transverse sinuses bilaterally. Patent flow through the stents. No residual or recurrent dural venous sinus stenosis. Electronically Signed   By: Morene Hoard M.D.   On: 03/17/2024 11:39   MR BRAIN WO CONTRAST Result Date: 03/17/2024 CLINICAL DATA:  Initial evaluation for acute neuro deficit, stroke suspected. EXAM: MRI HEAD WITHOUT CONTRAST TECHNIQUE: Multiplanar, multiecho pulse sequences of the brain and surrounding structures were obtained without intravenous contrast. COMPARISON:  None Available. FINDINGS: Brain: Cerebral volume within normal limits for age. No focal parenchymal signal abnormality. No abnormal foci of restricted diffusion to suggest acute or subacute ischemia. Gray-white matter differentiation well maintained. No encephalomalacia to suggest chronic cortical infarction or other insult. No acute intracranial hemorrhage. Single chronic microhemorrhage noted within the left cerebellum, of doubtful significance in isolation. No mass lesion, midline shift or mass effect. Ventricles normal in size and morphology without hydrocephalus. No extra-axial fluid collection. Empty sella noted.  Suprasellar region within normal limits. Vascular: Major intracranial vascular flow voids are well maintained. Skull and upper cervical spine: Craniocervical junction within normal limits. Visualized upper cervical spine demonstrates no significant finding. Bone marrow signal intensity within normal limits. No scalp soft tissue abnormality. Sinuses/Orbits: Globes and  orbital soft tissues are within normal limits. Paranasal sinuses are largely clear. No significant mastoid effusion. Other: None. IMPRESSION: 1. No acute intracranial abnormality. 2. Empty sella. While this finding is often incidental in nature and of no clinical significance, this can also be seen in the setting of idiopathic intracranial hypertension. Electronically Signed   By: Morene Hoard M.D.   On: 03/17/2024 09:24   DG Chest Portable 1 View Result Date: 03/17/2024 CLINICAL  DATA:  Chest pain. EXAM: PORTABLE CHEST 1 VIEW COMPARISON:  AP and lateral chest 10/13/2022 FINDINGS: The heart size and mediastinal contours are within normal limits. Both lungs are clear. The visualized skeletal structures are unremarkable. IMPRESSION: No active disease. Electronically Signed   By: Francis Quam M.D.   On: 03/17/2024 07:55     Procedures   Medications Ordered in the ED  iohexol  (OMNIPAQUE ) 300 MG/ML solution 75 mL (has no administration in time range)  aspirin  chewable tablet 324 mg (324 mg Oral Given 03/17/24 0810)  iohexol  (OMNIPAQUE ) 350 MG/ML injection 75 mL (75 mLs Intravenous Contrast Given 03/17/24 1018)  fentaNYL  (SUBLIMAZE ) injection 50 mcg (50 mcg Intravenous Given 03/17/24 1137)  alum & mag hydroxide-simeth (MAALOX/MYLANTA) 200-200-20 MG/5ML suspension 30 mL (30 mLs Oral Given 03/17/24 1137)    And  lidocaine  (XYLOCAINE ) 2 % viscous mouth solution 15 mL (15 mLs Oral Given 03/17/24 1137)  famotidine  (PEPCID ) IVPB 20 mg premix (20 mg Intravenous New Bag/Given 03/17/24 1138)                                    Medical Decision Making Amount and/or Complexity of Data Reviewed Labs: ordered. Radiology: ordered.  Risk OTC drugs. Prescription drug management.   This patient presents to the ED for concern of chest pain, this involves an extensive number of treatment options, and is a complaint that carries with it a high risk of complications and morbidity.  The differential diagnosis  includes ACS, pericarditis, GERD, pneumonia, PUD, pancreatitis   Co morbidities / Chronic conditions that complicate the patient evaluation  HTN, DM, anxiety, depression, arthritis, GERD, CHF, migraines   Additional history obtained:  Additional history obtained from EMR External records from outside source obtained and reviewed including in a   Lab Tests:  I Ordered, and personally interpreted labs.  The pertinent results include: Normal hemoglobin, no leukocytosis, normal kidney function, normal electrolytes, normal troponin, normal BNP   Imaging Studies ordered:  I ordered imaging studies including chest x-ray, CTA head and neck, MRI brain I independently visualized and interpreted imaging which showed no acute findings I agree with the radiologist interpretation   Cardiac Monitoring: / EKG:  The patient was maintained on a cardiac monitor.  I personally viewed and interpreted the cardiac monitored which showed an underlying rhythm of: Sinus rhythm   Problem List / ED Course / Critical interventions / Medication management  Patient presenting for intermittent left-sided chest pain with radiation to left arm over the past 2 days.  She has not noted any pattern to the symptoms.  She does not believe that they are related to exertion.  She was awakened from sleep this morning at 5:30 AM with worsened symptoms.  Currently, she describes 8/10 severity left-sided chest discomfort with a aching/tingling sensation in her left arm.  She describes mild shortness of breath but denies any other associated symptoms.  On arrival, she is overall well-appearing.  On exam, she does endorse some diminished sensation to left arm and leg.  She has some very subtle left hemibody weakness and dysmetria when compared to the right.  Weakness does not cause pronator drift. Symptoms not consistent with LVO.  She reports last known well was 10 PM last night, when she went to sleep.  EKG does not show any ST  segment abnormalities.  Workup was initiated.  Lab results were unremarkable.  Patient underwent CTA of head  and neck in addition to MRI brain.  No acute findings were identified.  Her pain did improve after fentanyl  and GI cocktail.  Repeat troponin was reassuring.  Patient stable for discharge with outpatient cardiology follow-up. I ordered medication including fentanyl  and GI cocktail for analgesia; ASA for possible ACS   Reevaluation of the patient after these medicines showed that the patient improved I have reviewed the patients home medicines and have made adjustments as needed  Social Determinants of Health:  Lives independently     Final diagnoses:  Chest pain, unspecified type    ED Discharge Orders          Ordered    Ambulatory referral to Cardiology       Comments: If you have not heard from the Cardiology office within the next 72 hours please call [phone].   03/17/24 1217               Melvenia Motto, MD 03/17/24 1220

## 2024-03-17 NOTE — Discharge Instructions (Addendum)
 Your test results today were reassuring.  You should hear from cardiology office to schedule an outpatient follow-up visit.  If you do not hear from their office, call the telephone number below.  You should follow-up with neurology as well.  Return to the emergency department for any new or worsening symptoms of concern.

## 2024-03-17 NOTE — ED Triage Notes (Signed)
 Pt c/o CP mid to L side that is sharp in nature and radiates down L arm and causes intermittent L arm numbness that comes and goes x 2 days that has progressively gotten worse. Also states a little shob and a little dizziness and Hx of CHF

## 2024-03-18 NOTE — Progress Notes (Signed)
 Office Visit Note  Patient: Virginia Douglas             Date of Birth: 02/19/1976           MRN: [id number]             PCP: Georgina Speaks, FNP Referring: Georgina Speaks, FNP Visit Date: 04/01/2024 Occupation: @GUAROCC @  Subjective:  Pain and swelling in joints  History of Present Illness: Virginia Douglas is a 48 y.o. female with seronegative rheumatoid arthritis and osteoarthritis.  She returns today after her last visit in June 2025.  She states that after every dose of methotrexate  she gets vaginal yeast infection for which she has to take Diflucan .  She continues to take hydroxychloroquine  200 mg p.o. twice daily without any interruption and tolerating it well.  She discontinued methotrexate  2 weeks ago.  She has not had recurrence of yeast infection since she stopped methotrexate .  She had diverticulitis in May 2025.  She complains of pain and discomfort in her both hands, both feet, pain in her arms and her legs.  She states all of her muscles are painful.    Activities of Daily Living:  Patient reports morning stiffness for 3 hours.   Patient Reports nocturnal pain.  Difficulty dressing/grooming: Reports Difficulty climbing stairs: Reports Difficulty getting out of chair: Reports Difficulty using hands for taps, buttons, cutlery, and/or writing: Reports  Review of Systems  Constitutional:  Positive for fatigue and weight loss.  HENT:  Negative for mouth sores and mouth dryness.   Eyes:  Positive for dryness.  Respiratory:  Negative for difficulty breathing.   Cardiovascular:  Positive for palpitations.  Gastrointestinal:  Positive for constipation and diarrhea. Negative for blood in stool.  Endocrine: Positive for increased urination.  Genitourinary:  Negative for difficulty urinating and involuntary urination.  Musculoskeletal:  Positive for joint pain, joint pain, joint swelling, myalgias, morning stiffness, muscle tenderness and myalgias. Negative for gait  problem and muscle weakness.  Skin:  Positive for color change, hair loss and sensitivity to sunlight. Negative for rash.  Allergic/Immunologic: Positive for susceptible to infections.  Neurological:  Positive for dizziness and headaches.  Hematological:  Negative for swollen glands.  Psychiatric/Behavioral:  Positive for depressed mood and sleep disturbance. The patient is nervous/anxious.     PMFS History:  Patient Active Problem List   Diagnosis Date Noted   Other fatigue 01/17/2024   Abnormal weight loss 01/17/2024   Diverticulosis 01/17/2024   Rash and nonspecific skin eruption 01/17/2024   Influenza vaccination declined 11/02/2023   COVID-19 vaccine administered 11/02/2023   Need for pneumococcal vaccination 11/02/2023   Class 1 obesity due to excess calories with body mass index (BMI) of 30.0 to 30.9 in adult 11/02/2023   Fall 10/18/2023   Acute left ankle pain 10/18/2023   HFrEF (heart failure with reduced ejection fraction) (HCC) 11/09/2022   Type 2 diabetes mellitus with obesity (HCC) 11/08/2022   DKA (diabetic ketoacidosis) (HCC) 10/13/2022   Cerebral venous thrombosis of sigmoid sinus 11/21/2021   Pulsatile tinnitus of right ear 11/21/2021   Pulsatile tinnitus, bilateral 06/27/2021   AVM (arteriovenous malformation) brain 05/31/2021   DDD (degenerative disc disease), cervical 12/06/2020   DDD (degenerative disc disease), lumbar 12/06/2020   Gastroesophageal reflux disease with esophagitis without hemorrhage 12/06/2020   Increased intracranial pressure 12/06/2020   PTSD (post-traumatic stress disorder) 12/06/2020   Attention deficit hyperactivity disorder (ADHD), predominantly inattentive type 12/06/2020   Vitamin D  deficiency 12/06/2020   Obesity (BMI 30-39.9)  03/15/2020   Sciatic nerve pain 12/10/2019   Grief 06/05/2019   Migraine 06/05/2019   Depression 04/07/2019   Obesity due to excess calories without serious comorbidity 04/07/2019   Anxiety 04/07/2019    Headache 07/06/2018   Benign hypertensive heart disease with congestive heart failure and with combined systolic and diastolic dysfunction (HCC) 12/08/2015   Type 2 diabetes mellitus with obesity (HCC) 12/08/2015   Paresthesia 09/16/2015    Past Medical History:  Diagnosis Date   Acute pain of left shoulder 06/05/2019   ADHD (attention deficit hyperactivity disorder)    Anemia    iron deficiency   as a teenager   Anxiety    Arthritis    Chronic combined systolic and diastolic congestive heart failure (HCC) 11/23/2022   This is a new diagnosis with Cardiology, she is scheduled on 2/14 for her MRI of her heart.     Depression    Diabetes (HCC)    Diverticulitis 01/16/2024   Edema    GERD (gastroesophageal reflux disease)    History of congestive heart failure    dx by cardiology   Hypertension    patient denies- patient stated that the PCP said it was for heart prevention.   Left leg pain 04/07/2019   LLQ pain 12/08/2015   Migraine headache    Urinary urgency 09/16/2015    Family History  Problem Relation Age of Onset   Diabetes Mother    Hypertension Mother    Diabetes Father    Hypertension Father    Kidney failure Father    Anxiety disorder Father    Hypertension Brother    Cancer Maternal Grandmother    Diabetes Maternal Grandmother    Pancreatic cancer Maternal Grandmother    Arthritis Maternal Grandmother    Diabetes Paternal Grandmother    Breast cancer Paternal Grandmother    Stroke Paternal Grandfather    Healthy Son    ADD / ADHD Son    Healthy Son    Asthma Son    Cancer Maternal Aunt        breast   Breast cancer Maternal Aunt    Birth defects Maternal Aunt    Past Surgical History:  Procedure Laterality Date   ABDOMINAL HYSTERECTOMY  2012   CHOLECYSTECTOMY     COLONOSCOPY  12/28/2020   polyps   ESOPHAGEAL DILATION     gamma knife     04/2022   IR ANGIO INTRA EXTRACRAN SEL COM CAROTID INNOMINATE BILAT MOD SED  05/31/2021   IR ANGIO INTRA  EXTRACRAN SEL COM CAROTID INNOMINATE BILAT MOD SED  06/27/2021   IR ANGIO INTRA EXTRACRAN SEL COM CAROTID INNOMINATE UNI R MOD SED  11/21/2021   IR ANGIO VERTEBRAL SEL VERTEBRAL UNI R MOD SED  05/31/2021   IR CT HEAD LTD  06/27/2021   IR CT HEAD LTD  11/21/2021   IR INTRA CRAN STENT  06/27/2021   IR INTRA CRAN STENT  11/21/2021   IR RADIOLOGIST EVAL & MGMT  06/03/2021   IR RADIOLOGIST EVAL & MGMT  07/13/2021   IR RADIOLOGIST EVAL & MGMT  08/27/2021   IR RADIOLOGIST EVAL & MGMT  10/30/2021   IR RADIOLOGIST EVAL & MGMT  12/06/2021   IR US  GUIDE VASC ACCESS RIGHT  05/31/2021   IR US  GUIDE VASC ACCESS RIGHT  06/27/2021   IR US  GUIDE VASC ACCESS RIGHT  11/21/2021   OVARIAN CYST REMOVAL     RADIOLOGY WITH ANESTHESIA N/A 06/27/2021   Procedure: STENTING;  Surgeon:  Dolphus Carrion, MD;  Location: [facility] OR;  Service: Radiology;  Laterality: N/A;   RADIOLOGY WITH ANESTHESIA N/A 11/21/2021   Procedure: IR WITH ANESTHESIA STENTING;  Surgeon: Dolphus Carrion, MD;  Location: [facility] OR;  Service: Radiology;  Laterality: N/A;   Social History   Social History Narrative   In relationship, 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 She lives with her 2 children in a one story house. She drinks one large cup of coffee a day and an occasional tea or soda. She walks daily.      One story home      Immunization History  Administered Date(s) Administered   HPV 9-valent 04/11/2019   Hepb-cpg 01/08/2024   Influenza Inj Mdck Quad Pf 05/18/2018   Influenza,inj,Quad PF,6+ Mos 05/20/2017, 05/14/2019, 06/02/2020, 05/09/2022   Influenza-Unspecified 05/11/2017, 05/15/2018, 05/08/2019, 07/13/2021   Janssen (J&J) SARS-COV-2 Vaccination 11/23/2019   PFIZER(Purple Top)SARS-COV-2 Vaccination 07/11/2020   PNEUMOCOCCAL CONJUGATE-20 10/18/2023   Pfizer(Comirnaty)Fall Seasonal  Vaccine 12 years and older 01/15/2023, 10/18/2023   Pneumococcal Polysaccharide-23 04/08/2019, 07/15/2019   Tdap 07/02/2017, 04/09/2019     Objective: Vital Signs: BP 111/71 (BP Location: Right Arm, Patient Position: Sitting, Cuff Size: Normal)   Pulse 98   Resp 15   Ht 5' 6 (1.676 m)   Wt 177 lb (80.3 kg)   BMI 28.57 kg/m    Physical Exam Vitals and nursing note reviewed.  Constitutional:      Appearance: She is well-developed.  HENT:     Head: Normocephalic and atraumatic.  Eyes:     Conjunctiva/sclera: Conjunctivae normal.  Cardiovascular:     Rate and Rhythm: Normal rate and regular rhythm.     Heart sounds: Normal heart sounds.  Pulmonary:     Effort: Pulmonary effort is normal.     Breath sounds: Normal breath sounds.  Abdominal:     General: Bowel sounds are normal.     Palpations: Abdomen is soft.  Musculoskeletal:     Cervical back: Normal range of motion.  Lymphadenopathy:     Cervical: No cervical adenopathy.  Skin:    General: Skin is warm and dry.     Capillary Refill: Capillary refill takes less than 2 seconds.  Neurological:     Mental Status: She is alert and oriented to person, place, and time.  Psychiatric:        Behavior: Behavior normal.      Musculoskeletal Exam: She had limited painful range of motion of the cervical spine.  She had painful range of motion of bilateral shoulders, wrist joints, tenderness over MCP joints with no synovitis.  Hip joints were in good range of motion with tenderness over bilateral trochanteric bursa.  She had tenderness over the medial aspect of her knee without any warmth swelling or effusion.  She has tenderness over her ankles or MTPs without any synovitis.  CDAI Exam: CDAI Score: 16  Patient Global: 60 / 100; Provider Global: 20 / 100 Swollen: 0 ; Tender: 19  Joint Exam 04/01/2024      Right  Left  Glenohumeral   Tender   Tender  Wrist   Tender   Tender  MCP 2   Tender   Tender  MCP 3   Tender   Tender   Cervical Spine   Tender     MTP 1  Tender   Tender  MTP 2   Tender   Tender  MTP 3   Tender   Tender  MTP 4   Tender   Tender  MTP 5   Tender   Tender     Investigation: No additional findings.  Imaging: CT ANGIO HEAD NECK W WO CM Result Date: 03/17/2024 CLINICAL DATA:  Initial evaluation for acute neuro deficit, stroke suspected. EXAM: CT ANGIOGRAPHY HEAD AND NECK WITH AND WITHOUT CONTRAST TECHNIQUE: Multidetector CT imaging of the head and neck was performed using the standard protocol during bolus administration of intravenous contrast. Multiplanar CT image reconstructions and MIPs were obtained to evaluate the vascular anatomy. Carotid stenosis measurements (when applicable) are obtained utilizing NASCET criteria, using the distal internal carotid diameter as the denominator. RADIATION DOSE REDUCTION: This exam was performed according to the departmental dose-optimization program which includes automated exposure control, adjustment of the mA and/or kV according to patient size and/or use of iterative reconstruction technique. CONTRAST:  75mL OMNIPAQUE  IOHEXOL  350 MG/ML SOLN COMPARISON:  None Available. FINDINGS: CT HEAD FINDINGS Brain: Cerebral volume within normal limits for patient age. No acute intracranial hemorrhage. No acute large vessel territory infarct. No mass lesion, midline shift, or mass effect. Ventricles are normal in size without hydrocephalus. No extra-axial fluid collection. Empty sella again noted. Vascular: No abnormal hyperdense vessel. Skull: Scalp soft tissues demonstrate no acute abnormality. Calvarium intact. Sinuses/Orbits: Globes and orbital soft tissues within normal limits. Visualized paranasal sinuses are largely clear. No significant mastoid effusion. CTA NECK FINDINGS Aortic arch: Standard branching. Imaged portion shows no evidence of aneurysm or dissection. No significant stenosis of the major arch vessel origins. Right carotid system: No evidence of  dissection, stenosis (50% or greater), or occlusion. Left carotid system: No evidence of dissection, stenosis (50% or greater), or occlusion. Vertebral arteries: No evidence of dissection, stenosis (50% or greater), or occlusion. Skeleton: No discrete or worrisome osseous lesions. Other neck: No other acute finding. Upper chest: No other acute finding. Review of the MIP images confirms the above findings CTA HEAD FINDINGS Anterior circulation: Stenosis. Left vertebral artery dominant. Both PICA patent. Basilar patent without stenosis. Superior cerebellar and posterior cerebral arteries patent bilaterally. Posterior circulation:  Both V4 segments patent without significant Venous sinuses: Patent allowing for timing the contrast bolus. Vascular stents in place within the distal transverse sinuses bilaterally. Patent flow through the stents. No residual or recurrent dural venous sinus stenosis. Anatomic variants: None significant.  No aneurysm. Review of the MIP images confirms the above findings IMPRESSION: 1. Normal CTA of the head and neck. No large vessel occlusion or other emergent finding. No hemodynamically significant or correctable stenosis. 2. No other acute intracranial abnormality. 3. Vascular stents in place within the distal transverse sinuses bilaterally. Patent flow through the stents. No residual or recurrent dural venous sinus stenosis. Electronically Signed   By: Morene Hoard M.D.   On: 03/17/2024 11:39   MR BRAIN WO CONTRAST Result Date: 03/17/2024 CLINICAL DATA:  Initial evaluation for acute neuro deficit, stroke suspected. EXAM: MRI HEAD WITHOUT CONTRAST TECHNIQUE: Multiplanar, multiecho pulse sequences of the brain and surrounding structures were obtained without intravenous contrast. COMPARISON:  None Available. FINDINGS: Brain: Cerebral volume within normal limits for age. No focal parenchymal signal abnormality. No abnormal foci of restricted diffusion to suggest acute or subacute  ischemia. Gray-white matter differentiation well maintained. No encephalomalacia to suggest chronic cortical infarction or other insult. No acute intracranial hemorrhage. Single chronic microhemorrhage noted within the left cerebellum,  of doubtful significance in isolation. No mass lesion, midline shift or mass effect. Ventricles normal in size and morphology without hydrocephalus. No extra-axial fluid collection. Empty sella noted.  Suprasellar region within normal limits. Vascular: Major intracranial vascular flow voids are well maintained. Skull and upper cervical spine: Craniocervical junction within normal limits. Visualized upper cervical spine demonstrates no significant finding. Bone marrow signal intensity within normal limits. No scalp soft tissue abnormality. Sinuses/Orbits: Globes and orbital soft tissues are within normal limits. Paranasal sinuses are largely clear. No significant mastoid effusion. Other: None. IMPRESSION: 1. No acute intracranial abnormality. 2. Empty sella. While this finding is often incidental in nature and of no clinical significance, this can also be seen in the setting of idiopathic intracranial hypertension. Electronically Signed   By: Morene Hoard M.D.   On: 03/17/2024 09:24   DG Chest Portable 1 View Result Date: 03/17/2024 CLINICAL DATA:  Chest pain. EXAM: PORTABLE CHEST 1 VIEW COMPARISON:  AP and lateral chest 10/13/2022 FINDINGS: The heart size and mediastinal contours are within normal limits. Both lungs are clear. The visualized skeletal structures are unremarkable. IMPRESSION: No active disease. Electronically Signed   By: Francis Quam M.D.   On: 03/17/2024 07:55    Recent Labs: Lab Results  Component Value Date   WBC 7.8 03/17/2024   HGB 12.5 03/17/2024   PLT 255 03/17/2024   NA 142 03/17/2024   K 3.5 03/17/2024   CL 110 03/17/2024   CO2 19 (L) 03/17/2024   GLUCOSE 88 03/17/2024   BUN 7 03/17/2024   CREATININE 0.62 03/17/2024   BILITOT 0.5  03/17/2024   ALKPHOS 96 03/17/2024   AST 22 03/17/2024   ALT 14 03/17/2024   PROT 7.8 03/17/2024   ALBUMIN 4.3 03/17/2024   CALCIUM  9.8 03/17/2024   GFRAA 89 07/15/2020   QFTBGOLDPLUS NEGATIVE 12/14/2023    Speciality Comments: PLQ Eye Exam:  01/02/2024 WNL @ [facility] Follow up in 1 year   Procedures:  No procedures performed Allergies: Patient has no known allergies.   Assessment / Plan:     Visit Diagnoses: Seronegative rheumatoid arthritis (HCC) - 10/03/23: RF-, Anti-CCP-, 14-3-3 eta-, ESR WNL, CRP WNL.  U/s 09/3023: positive for synovitis: -Patient complains of pain and discomfort in multiple joints.  She states since she came off methotrexate  the pain has been worse in the last 2 weeks.  She discontinued methotrexate  due to frequent thrush.  She continues to be on Plaquenil  200 mg p.o. twice daily.  Patient reports her rheumatoid arthritis on the scale of 0-10 with about 60.  No synovitis was noted on the examination today.  She had tenderness in multiple joints and stiffness with range of motion.  Plan: Sedimentation rate.  I discussed the option of adding sulfasalazine.  Indications side effects contraindications were discussed at length.  A handout was given and consent was taken.  We will check G6PD today.  Once G6PD results are available we will start her on Azulfidine EN 500 mg p.o. twice daily.  Side effects of Azulfidine EN were discussed at length.  Medication counseling:   Does the patient have an allergy to sulfa  drugs? No  Patient was counseled on the purpose, proper use, and adverse effects of sulfasalazine including risk of infection and chance of nausea, headache, and sun sensitivity.  Also discussed risk of skin rash and advised patient to stop the medication and let us  know if she develops a rash. Also discussed for the potential of discoloration of the  urine, sweat, or tears.  Advised patient to avoid live vaccines.  Recommend annual influenza, Pneumovax 23,  Prevnar 13, and Shingrix as indicated.   Reviewed the importance of frequent labs to monitor liver, kidneys, and blood counts.  Standing orders placed and patient to return 1 month after starting therapy and then every 3 months.  Provided patient with educational materials on sulfasalazine and answered all questions.  Patient consented to sulfasalazine use, and consent will be uploaded into the media tab.    Patient dose will be Azulfidine EN 500 mg, 2 tablets p.o. twice daily.  Prescription will be sent to pharmacy pending lab results and insurance approval.   High risk medication use - Plaquenil  200 mg 1 tablet by mouth twice daily, (methotrexate  6 tablets by mouth once weekly -discontinued 2 weeks ago due to frequent rash) and folic acid  2 mg - Plan: Glucose 6 phosphate dehydrogenase.  Patient was advised to get labs in a month after starting Azulfidine EN and then every 3 months.  Positive ANA (antinuclear antibody) - ANA 1:80 cytoplasmic, speckled on 03/27/22.  Repeat ANA on 07/12/22 negative.  ANCA negative on 11/06/2023.  Primary osteoarthritis of both hands-she complains of pain and discomfort in the bilateral hands.  No synovitis was noted.  She had tenderness over her wrist joints and MCP joints.  Raynaud's disease without gangrene-she gives history of Raynaud's phenomenon during the winter months.  She had good capillary refill without any nailbed capillary changes.  Chronic pain of both knees-she continues to have discomfort over the medial aspect of her knees.  No warmth swelling or effusion was noted.  DDD (degenerative disc disease), cervical-she continues to have limited range of motion of the cervical spine.  Myofascial pain-she complains of generalized pain, hyperalgesia and positive tender points.  She used to be on tramadol  which she discontinued.  Advised her to schedule an appointment with the pain management.  Degeneration of intervertebral disc of lumbar region without  discogenic back pain or lower extremity pain-chronic discomfort.  Pain in both feet-she complains of pain in her both feet.  No synovitis was noted.  Facial rash-she gives history of intermittent rash.  No rash was present today.  Other medical problems are listed as follows:  Photosensitivity  Essential hypertension-blood pressure was normal at 111/71.  Type 2 diabetes mellitus without complication, without long-term current use of insulin  (HCC)  Increased intracranial pressure  AVM (arteriovenous malformation) brain  Hx of migraine headaches  Attention deficit hyperactivity disorder (ADHD), predominantly inattentive type  Gastroesophageal reflux disease with esophagitis without hemorrhage  PTSD (post-traumatic stress disorder)  Anxiety and depression  History of chronic CHF  Former smoker  Vitamin D  deficiency  Other fatigue  Cerebral venous thrombosis of sigmoid sinus  Orders: Orders Placed This Encounter  Procedures   Sedimentation rate   Glucose 6 phosphate dehydrogenase   No orders of the defined types were placed in this encounter.   Follow-Up Instructions: Return in about 3 months (around 07/02/2024) for Rheumatoid arthritis.   Maya Nash, MD  Note - This record has been created using Animal nutritionist.  Chart creation errors have been sought, but may not always  have been located. Such creation errors do not reflect on  the standard of medical care.

## 2024-04-01 ENCOUNTER — Encounter: Payer: Self-pay | Admitting: Rheumatology

## 2024-04-01 ENCOUNTER — Telehealth: Payer: Self-pay | Admitting: Pharmacist

## 2024-04-01 ENCOUNTER — Ambulatory Visit: Payer: No Typology Code available for payment source | Attending: Rheumatology | Admitting: Rheumatology

## 2024-04-01 VITALS — BP 111/71 | HR 98 | Resp 15 | Ht 66.0 in | Wt 177.0 lb

## 2024-04-01 DIAGNOSIS — E559 Vitamin D deficiency, unspecified: Secondary | ICD-10-CM

## 2024-04-01 DIAGNOSIS — L568 Other specified acute skin changes due to ultraviolet radiation: Secondary | ICD-10-CM

## 2024-04-01 DIAGNOSIS — M5030 Other cervical disc degeneration, unspecified cervical region: Secondary | ICD-10-CM

## 2024-04-01 DIAGNOSIS — M7918 Myalgia, other site: Secondary | ICD-10-CM

## 2024-04-01 DIAGNOSIS — G08 Intracranial and intraspinal phlebitis and thrombophlebitis: Secondary | ICD-10-CM

## 2024-04-01 DIAGNOSIS — Z8669 Personal history of other diseases of the nervous system and sense organs: Secondary | ICD-10-CM

## 2024-04-01 DIAGNOSIS — G932 Benign intracranial hypertension: Secondary | ICD-10-CM

## 2024-04-01 DIAGNOSIS — R21 Rash and other nonspecific skin eruption: Secondary | ICD-10-CM

## 2024-04-01 DIAGNOSIS — M51369 Other intervertebral disc degeneration, lumbar region without mention of lumbar back pain or lower extremity pain: Secondary | ICD-10-CM

## 2024-04-01 DIAGNOSIS — Z79899 Other long term (current) drug therapy: Secondary | ICD-10-CM

## 2024-04-01 DIAGNOSIS — M0600 Rheumatoid arthritis without rheumatoid factor, unspecified site: Secondary | ICD-10-CM | POA: Diagnosis not present

## 2024-04-01 DIAGNOSIS — F32A Depression, unspecified: Secondary | ICD-10-CM

## 2024-04-01 DIAGNOSIS — R768 Other specified abnormal immunological findings in serum: Secondary | ICD-10-CM

## 2024-04-01 DIAGNOSIS — Z87891 Personal history of nicotine dependence: Secondary | ICD-10-CM

## 2024-04-01 DIAGNOSIS — G8929 Other chronic pain: Secondary | ICD-10-CM

## 2024-04-01 DIAGNOSIS — I10 Essential (primary) hypertension: Secondary | ICD-10-CM

## 2024-04-01 DIAGNOSIS — F419 Anxiety disorder, unspecified: Secondary | ICD-10-CM

## 2024-04-01 DIAGNOSIS — M79671 Pain in right foot: Secondary | ICD-10-CM

## 2024-04-01 DIAGNOSIS — Z8679 Personal history of other diseases of the circulatory system: Secondary | ICD-10-CM

## 2024-04-01 DIAGNOSIS — F4310 Post-traumatic stress disorder, unspecified: Secondary | ICD-10-CM

## 2024-04-01 DIAGNOSIS — I7300 Raynaud's syndrome without gangrene: Secondary | ICD-10-CM

## 2024-04-01 DIAGNOSIS — M19041 Primary osteoarthritis, right hand: Secondary | ICD-10-CM

## 2024-04-01 DIAGNOSIS — M19042 Primary osteoarthritis, left hand: Secondary | ICD-10-CM

## 2024-04-01 DIAGNOSIS — R5383 Other fatigue: Secondary | ICD-10-CM

## 2024-04-01 DIAGNOSIS — Q282 Arteriovenous malformation of cerebral vessels: Secondary | ICD-10-CM

## 2024-04-01 DIAGNOSIS — E119 Type 2 diabetes mellitus without complications: Secondary | ICD-10-CM

## 2024-04-01 DIAGNOSIS — M25562 Pain in left knee: Secondary | ICD-10-CM

## 2024-04-01 DIAGNOSIS — M25561 Pain in right knee: Secondary | ICD-10-CM

## 2024-04-01 DIAGNOSIS — M79672 Pain in left foot: Secondary | ICD-10-CM

## 2024-04-01 DIAGNOSIS — K2100 Gastro-esophageal reflux disease with esophagitis, without bleeding: Secondary | ICD-10-CM

## 2024-04-01 DIAGNOSIS — F900 Attention-deficit hyperactivity disorder, predominantly inattentive type: Secondary | ICD-10-CM

## 2024-04-01 NOTE — Patient Instructions (Signed)
 Sulfasalazine Delayed-Release Tablets What is this medication? SULFASALAZINE (sul fa SAL a zeen) treats ulcerative colitis. It may also be used to treat rheumatoid arthritis when other medications have not worked or cannot be tolerated. It works by decreasing inflammation. It belongs to a group of medications called salicylates. This medicine may be used for other purposes; ask your health care provider or pharmacist if you have questions. COMMON BRAND NAME(S): Azulfidine En-Tabs, Sulfazine EC What should I tell my care team before I take this medication? They need to know if you have any of these conditions: Asthma Blood disorders or anemia Glucose-6-phosphate dehydrogenase (G6PD) deficiency Intestinal obstruction Kidney disease Liver disease Porphyria Urinary tract obstruction An unusual reaction to sulfasalazine, sulfa  medications, salicylates, other medications, foods, dyes, or preservatives Pregnant or trying to get pregnant Breast-feeding How should I use this medication? Take this medication by mouth with a full glass of water. Take it as directed on the prescription label at the same time every day. Do not cut, crush, or chew this medication. Swallow capsules whole. You can take it with or without food. If it upsets your stomach, take it with food. Keep taking it unless your care team tells you to stop. Talk to your care team about the use of this medication in children. Special care may be needed. While this medication may be prescribed for children as young as 6 years for selected conditions, precautions do apply. Patients over 67 years old may have a stronger reaction and need a smaller dose. Overdosage: If you think you have taken too much of this medicine contact a poison control center or emergency room at once. NOTE: This medicine is only for you. Do not share this medicine with others. What if I miss a dose? If you miss a dose, take it as soon as you can. If it is almost time  for your next dose, take only that dose. Do not take double or extra doses. What may interact with this medication? Digoxin Folic acid  This list may not describe all possible interactions. Give your health care provider a list of all the medicines, herbs, non-prescription drugs, or dietary supplements you use. Also tell them if you smoke, drink alcohol , or use illegal drugs. Some items may interact with your medicine. What should I watch for while using this medication? Visit your care team for regular checks on your progress. Tell your care team if your symptoms do not start to get better or if they get worse. You will need frequent blood and urine checks. This medication can make you more sensitive to the sun. Keep out of the sun. If you cannot avoid being in the sun, wear protective clothing and use sunscreen. Do not use sun lamps or tanning beds/booths. Drink plenty of water while taking this medication. Tell your care team if you see the tablet in your stools. Your body may not be absorbing the medication. What side effects may I notice from receiving this medication? Side effects that you should report to your care team as soon as possible: Allergic reactions--skin rash, itching, hives, swelling of the face, lips, tongue, or throat Aplastic anemia--unusual weakness or fatigue, dizziness, headache, trouble breathing, increased bleeding or bruising Dry cough, shortness of breath or trouble breathing Heart muscle inflammation--unusual weakness or fatigue, shortness of breath, chest pain, fast or irregular heartbeat, dizziness, swelling of the ankles, feet, or hands Infection--fever, chills, cough, sore throat, wounds that don't heal, pain or trouble when passing urine, general feeling of discomfort  or being unwell Kidney injury--decrease in the amount of urine, swelling of the ankles, hands, or feet Liver injury--right upper belly pain, loss of appetite, nausea, light-colored stool, dark yellow  or brown urine, yellowing skin or eyes, unusual weakness or fatigue Rash, fever, and swollen lymph nodes Redness, blistering, peeling, or loosening of the skin, including inside the mouth Side effects that usually do not require medical attention (report to your care team if they continue or are bothersome): Dark yellow or orange saliva, sweat, or urine Dizziness Headache Loss of appetite Nausea Upset stomach Vomiting This list may not describe all possible side effects. Call your doctor for medical advice about side effects. You may report side effects to FDA at 1-800-FDA-1088. Where should I keep my medication? Keep out of the reach of children and pets. Store at room temperature between 15 and 30 degrees C (59 and 86 degrees F). Get rid of any unused medication after the expiration date. To get rid of medications that are no longer needed or have expired: Take the medications to a medication take-back program. Check with your pharmacy or law enforcement to find a location. If your cannot return the medication, check the label or package insert to see if the medication should be thrown out in the garbage or flushed down the toilet. If you are not sure, ask your care team. If it is safe to put it in the trash, take the medication out of the container. Mix the medication with cat litter, dirt, coffee grounds, or other unwanted substance. Seal the mixture in a bag or container. Put it in the trash. NOTE: This sheet is a summary. It may not cover all possible information. If you have questions about this medicine, talk to your doctor, pharmacist, or health care provider.  2024 [employer]/Gold Standard (2021-07-14 00:00:00)  Standing Labs We placed an order today for your standing lab work.   Please have your standing labs drawn in 1 month after starting Azulfidine EN and then every 3 months  Please have your labs drawn 2 weeks prior to your appointment so that the provider can discuss your lab  results at your appointment, if possible.  Please note that you may see your imaging and lab results in MyChart before we have reviewed them. We will contact you once all results are reviewed. Please allow our office up to 72 hours to thoroughly review all of the results before contacting the office for clarification of your results.  WALK-IN LAB HOURS  Monday through Thursday from 8:00 am -12:30 pm and 1:00 pm-4:30 pm and Friday from 8:00 am-12:00 pm.  Patients with office visits requiring labs will be seen before walk-in labs.  You may encounter longer than normal wait times. Please allow additional time. Wait times may be shorter on  Monday and Thursday afternoons.  We do not book appointments for walk-in labs. We appreciate your patience and understanding with our staff.   Labs are drawn by [facility]. Please bring your co-pay at the time of your lab draw.  You may receive a bill from [facility] for your lab work.  Please note if you are on Hydroxychloroquine  and and an order has been placed for a Hydroxychloroquine  level,  you will need to have it drawn 4 hours or more after your last dose.  If you wish to have your labs drawn at another location, please call the office 24 hours in advance so we can fax the orders.  The office is located at [address], [address], [place], [place] [address]   If you have any questions regarding directions or hours of operation,  please call [phone].   As a reminder, please drink plenty of water prior to coming for your lab work. Thanks!   Vaccines You are taking a medication(s) that can suppress your immune system.  The following immunizations are recommended: Flu annually Covid-19  Td/Tdap (tetanus, diphtheria, pertussis) every 10 years Pneumonia (Prevnar 15 then Pneumovax 23 at least 1 year apart.  Alternatively, can take Prevnar 20 without needing additional dose) Shingrix: 2 doses from 4 weeks to 6 months apart  Please check with your PCP to  make sure you are up to date.   If you have signs or symptoms of an infection or start antibiotics: First, call your PCP for workup of your infection. Hold your medication through the infection, until you complete your antibiotics, and until symptoms resolve if you take the following: Injectable medication (Actemra, Benlysta, Cimzia, Cosentyx, Enbrel, Humira, Kevzara, Orencia, Remicade, Simponi, Stelara, Taltz, Tremfya) Methotrexate  Leflunomide (Arava) Mycophenolate (Cellcept) Xeljanz, Olumiant, or Rinvoq

## 2024-04-01 NOTE — Telephone Encounter (Signed)
 Pending lab from today, patient will be sulfasalazine EC new start  Dose: 1000mg  twice daily  Repeat CBC/CMP in 1 month then every 3 months

## 2024-04-01 NOTE — Progress Notes (Signed)
 Pharmacy Note  Subjective:  Patient presents today to [facility] for follow up office visit.  Patient seen by pharmacist for counseling on sulfasalazine for rheumatoid arthritis.  Prior therapy includes:Methotrexate  and Plaquenil .  Objective: CMP     Component Value Date/Time   NA 142 03/17/2024 0753   NA 141 07/03/2023 0853   K 3.5 03/17/2024 0753   CL 110 03/17/2024 0753   CO2 19 (L) 03/17/2024 0753   GLUCOSE 88 03/17/2024 0753   BUN 7 03/17/2024 0753   BUN 4 (L) 07/03/2023 0853   CREATININE 0.62 03/17/2024 0753   CREATININE 0.70 12/14/2023 0924   CALCIUM  9.8 03/17/2024 0753   PROT 7.8 03/17/2024 0753   PROT 7.1 01/15/2023 1705   ALBUMIN 4.3 03/17/2024 0753   ALBUMIN 4.5 01/15/2023 1705   AST 22 03/17/2024 0753   ALT 14 03/17/2024 0753   ALKPHOS 96 03/17/2024 0753   BILITOT 0.5 03/17/2024 0753   BILITOT <0.2 01/15/2023 1705   GFRNONAA >60 03/17/2024 0753   GFRAA 89 07/15/2020 1052    CBC    Component Value Date/Time   WBC 7.8 03/17/2024 0753   RBC 4.42 03/17/2024 0753   HGB 12.5 03/17/2024 0753   HGB 13.5 10/12/2022 0905   HCT 39.5 03/17/2024 0753   HCT 40.7 10/12/2022 0905   PLT 255 03/17/2024 0753   PLT 321 10/12/2022 0905   MCV 89.4 03/17/2024 0753   MCV 85 10/12/2022 0905   MCH 28.3 03/17/2024 0753   MCHC 31.6 03/17/2024 0753   RDW 16.7 (H) 03/17/2024 0753   RDW 13.9 10/12/2022 0905   LYMPHSABS 2.8 03/17/2024 0753   MONOABS 0.4 03/17/2024 0753   EOSABS 0.2 03/17/2024 0753   BASOSABS 0.1 03/17/2024 0753     Baseline Immunosuppressant Therapy Labs TB GOLD    Latest Ref Rng & Units 12/14/2023    9:24 AM  Quantiferon TB Gold  Quantiferon TB Gold Plus NEGATIVE NEGATIVE    Hepatitis Panel    Latest Ref Rng & Units 12/14/2023    9:24 AM  Hepatitis  Hep B Surface Ag NON-REACTIVE NON-REACTIVE   Hep B IgM NON-REACTIVE NON-REACTIVE   Hep C Ab NON-REACTIVE NON-REACTIVE    HIV Lab Results  Component Value Date   HIV Non Reactive  01/31/2022   HIV Non Reactive 07/15/2019   Immunoglobulins    Latest Ref Rng & Units 12/14/2023    9:24 AM  Immunoglobulin Electrophoresis  IgA  47 - 310 mg/dL 838   IgG 399 - 8,359 mg/dL 8,856   IgM 50 - 699 mg/dL 91    SPEP    Latest Ref Rng & Units 03/17/2024    7:53 AM  Serum Protein Electrophoresis  Total Protein 6.5 - 8.1 g/dL 7.8    H3EI No results found for: G6PDH TPMT No results found for: TPMT   Chest x-ray (10/13/2022): No active cardiopulmonary disease.  Does the patient have an allergy to sulfa  drugs? No  Assessment/Plan: Patient was counseled on the purpose, proper use, and adverse effects of sulfasalazine including risk of infection and chance of nausea, headache, and sun sensitivity.  Also discussed risk of skin rash and advised patient to stop the medication and let us  know if she develops a rash. Also discussed for the potential of discoloration of the urine, sweat, or tears.  Advised patient to avoid live vaccines.  Recommend annual influenza, Pneumovax 23, Prevnar 13, and Shingrix as indicated.   Reviewed the importance of frequent labs to monitor liver,  kidneys, and blood counts.  Standing orders placed and patient to return 1 month after starting therapy and then every 3 months.  Provided patient with educational materials on sulfasalazine and answered all questions.  Patient consented to sulfasalazine use, and consent will be uploaded into the media tab.    Patient dose will begin at Sulfasalazine 500mg  BID. Once patient feels comfortable, will increase to 1000mg  BID. Prescription will be sent to pharmacy pending lab results and insurance approval.

## 2024-04-02 ENCOUNTER — Other Ambulatory Visit: Payer: Self-pay | Admitting: Rheumatology

## 2024-04-05 LAB — SEDIMENTATION RATE: Sed Rate: 17 mm/h (ref 0–20)

## 2024-04-05 LAB — GLUCOSE 6 PHOSPHATE DEHYDROGENASE: G-6PDH: 20.9 U/g{Hb} — ABNORMAL HIGH (ref 7.0–20.5)

## 2024-04-07 ENCOUNTER — Other Ambulatory Visit: Payer: Self-pay | Admitting: *Deleted

## 2024-04-07 ENCOUNTER — Ambulatory Visit: Payer: Self-pay | Admitting: Physician Assistant

## 2024-04-07 ENCOUNTER — Other Ambulatory Visit: Payer: Self-pay

## 2024-04-07 DIAGNOSIS — Z1231 Encounter for screening mammogram for malignant neoplasm of breast: Secondary | ICD-10-CM

## 2024-04-07 MED ORDER — SULFASALAZINE 500 MG PO TABS: 1000.0000 mg | ORAL_TABLET | Freq: Two times a day (BID) | ORAL | 0 refills | Status: AC

## 2024-04-07 NOTE — Telephone Encounter (Signed)
 RX sent to [facility] to send to pharmacy.

## 2024-04-15 ENCOUNTER — Other Ambulatory Visit: Payer: Self-pay | Admitting: Physician Assistant

## 2024-04-15 MED ORDER — CLOPIDOGREL BISULFATE 75 MG PO TABS: 75.0000 mg | ORAL_TABLET | Freq: Every day | ORAL | 3 refills | Status: DC

## 2024-04-22 ENCOUNTER — Encounter

## 2024-04-22 ENCOUNTER — Other Ambulatory Visit: Payer: Self-pay | Admitting: Nurse Practitioner

## 2024-04-22 DIAGNOSIS — Z1231 Encounter for screening mammogram for malignant neoplasm of breast: Secondary | ICD-10-CM

## 2024-05-07 NOTE — Progress Notes (Signed)
 [name], CMA,acting as a Neurosurgeon for Virginia Ada, FNP.,have documented all relevant documentation on the behalf of Virginia Ada, FNP,as directed by  Virginia Ada, FNP while in the presence of Virginia Ada, FNP.  Subjective:  Patient ID: Virginia Douglas , female    DOB: Sep 27, 1975 , 48 y.o.   MRN: [id number]  Chief Complaint  Patient presents with   Hypertension    Patient presents today for a bp and dm follow up, Patient reports compliance with medication. Patient denies any chest pain, SOB, or headaches. Patient has no concerns today.     HPI Discussed the use of AI scribe software for clinical note transcription with the patient, who gave verbal consent to proceed.  History of Present Illness Virginia Douglas is a 48 year old female with diabetes who presents for routine follow-up and medication management.  She has been under the care of a gastroenterologist, rheumatologist, and neurologist since her last visit. Her neurologist adjusted her medication regimen by introducing Azulfidine  around the end of July.  Her blood glucose levels have remained stable, and she does not require refills for most of her medications at this time, except for clonazepam , which she uses infrequently. Her last refill for clonazepam  was on April 11th.  She would like to receive her flu shot today.  No new issues or concerns were noted during the review of symptoms. She does not walk barefoot.       Diabetes She presents for her follow-up diabetic visit. She has type 2 diabetes mellitus. There are no hypoglycemic associated symptoms. There are no diabetic associated symptoms. Pertinent negatives for diabetes include no blurred vision. There are no hypoglycemic complications. Diabetic complications include heart disease. Risk factors for coronary artery disease include obesity and sedentary lifestyle. Current diabetic treatment includes oral agent (dual therapy) and oral agent  (monotherapy). She is compliant with treatment most of the time. She has not had a previous visit with a dietitian. She rarely participates in exercise. (Reports blood sugars have been okay) Eye exam is current.  Hypertension This is a chronic problem. The current episode started more than 1 year ago. The problem is unchanged. The problem is controlled. Pertinent negatives include no blurred vision. There are no associated agents to hypertension. Risk factors for coronary artery disease include obesity and sedentary lifestyle. Past treatments include diuretics and ACE inhibitors. The current treatment provides significant improvement. There is no history of chronic renal disease.     Past Medical History:  Diagnosis Date   Acute pain of left shoulder 06/05/2019   ADHD (attention deficit hyperactivity disorder)    Anemia    iron deficiency   as a teenager   Anxiety    Arthritis    Chronic combined systolic and diastolic congestive heart failure (HCC) 11/23/2022   This is a new diagnosis with Cardiology, she is scheduled on 2/14 for her MRI of her heart.     Depression    Diabetes (HCC)    Diverticulitis 01/16/2024   Edema    GERD (gastroesophageal reflux disease)    History of congestive heart failure    dx by cardiology   Hypertension    patient denies- patient stated that the PCP said it was for heart prevention.   Left leg pain 04/07/2019   LLQ pain 12/08/2015   Migraine headache    Urinary urgency 09/16/2015     Family History  Problem Relation Age of Onset   Diabetes Mother    Hypertension  Mother    Diabetes Father    Hypertension Father    Kidney failure Father    Anxiety disorder Father    Hypertension Brother    Cancer Maternal Grandmother    Diabetes Maternal Grandmother    Pancreatic cancer Maternal Grandmother    Arthritis Maternal Grandmother    Diabetes Paternal Grandmother    Breast cancer Paternal Grandmother    Stroke Paternal Grandfather    Healthy Son     ADD / ADHD Son    Healthy Son    Asthma Son    Cancer Maternal Aunt        breast   Breast cancer Maternal Aunt    Birth defects Maternal Aunt      Current Outpatient Medications:    aspirin  EC 81 MG tablet, Take 81 mg by mouth daily. Swallow whole., Disp: , Rfl:    Blood Pressure Monitoring Soln KIT, , Disp: , Rfl:    carvedilol  (COREG ) 3.125 MG tablet, Take 1 tablet (3.125 mg total) by mouth 2 (two) times daily., Disp: 180 tablet, Rfl: 3   cetirizine (ZYRTEC) 10 MG tablet, Take 10 mg by mouth daily as needed for allergies., Disp: , Rfl:    clopidogrel  (PLAVIX ) 75 MG tablet, Take 1 tablet (75 mg total) by mouth daily., Disp: 90 tablet, Rfl: 3   DULoxetine  (CYMBALTA ) 60 MG capsule, TAKE 1 CAPSULE BY MOUTH EVERY DAY, Disp: 90 capsule, Rfl: 1   empagliflozin  (JARDIANCE ) 10 MG TABS tablet, Take 1 tablet by mouth once daily in the morning, Disp: 30 tablet, Rfl: 5   famotidine  (PEPCID ) 20 MG tablet, Take 1 tablet (20 mg total) by mouth 2 (two) times daily for 14 days. (Patient taking differently: Take 20 mg by mouth as needed.), Disp: 28 tablet, Rfl: 0   folic acid  (FOLVITE ) 1 MG tablet, Take 2 tablets (2 mg total) by mouth daily., Disp: 180 tablet, Rfl: 3   Galcanezumab -gnlm (EMGALITY ) 120 MG/ML SOAJ, Inject 120 mg into the skin every 30 (thirty) days., Disp: 1.12 mL, Rfl: 11   hydroxychloroquine  (PLAQUENIL ) 200 MG tablet, Take 1 tablet (200 mg total) by mouth 2 (two) times daily., Disp: 180 tablet, Rfl: 0   insulin  aspart (FIASP  FLEXTOUCH) 100 UNIT/ML FlexTouch Pen, INJECT [place] INSULIN  BEFORE MEALS AND AT HS IF BS < 150 - 0 UNITS, 150-199 - 3 UNITS, 200-249 - 6 UNITS, 250-299 - 9 UNITS, 300-349 - 12 UNITS, >350 - 15 UNITS. IF >400 CALL OUR OFFICE WITH YOUR BLOOD SUGAR READINGS. (Patient taking differently: as needed. Inject Rush insulin  before meals and at hs if BS < 150 - 0 units, 150-199 - 3 units, 200-249 - 6 units, 250-299 - 9 units, 300-349 - 12 units, >350 - 15 units. If >400 call our office  with your blood sugar readings.), Disp: 15 mL, Rfl: 1   Insulin  Pen Needle (PEN NEEDLES) 32G X 4 MM MISC, 1 each by Does not apply route once a week., Disp: 30 each, Rfl: 3   Lancets (ONETOUCH DELICA PLUS LANCET33G) MISC, Apply 1 each topically 3 (three) times daily., Disp: , Rfl:    LINZESS  72 MCG capsule, TAKE 1 CAPSULE BY MOUTH DAILY BEFORE BREAKFAST., Disp: 30 capsule, Rfl: 5   ondansetron  (ZOFRAN -ODT) 4 MG disintegrating tablet, Take 1 tablet (4 mg total) by mouth every 8 (eight) hours as needed for nausea., Disp: 10 tablet, Rfl: 0   Polyethyl Glycol-Propyl Glycol (SYSTANE) 0.4-0.3 % SOLN, Place 1 drop into both eyes daily as needed (dry  eyes)., Disp: , Rfl:    sacubitril-valsartan (ENTRESTO ) 24-26 MG, Take 1 tablet by mouth 2 (two) times daily., Disp: 180 tablet, Rfl: 3   spironolactone  (ALDACTONE ) 25 MG tablet, Take 0.5 tablets (12.5 mg total) by mouth daily., Disp: 45 tablet, Rfl: 3   sulfaSALAzine  (AZULFIDINE ) 500 MG tablet, Take 2 tablets (1,000 mg total) by mouth 2 (two) times daily., Disp: 360 tablet, Rfl: 0   tirzepatide  (MOUNJARO ) 10 MG/0.5ML Pen, Inject 10 mg into the skin once a week., Disp: 6 mL, Rfl: 1   topiramate  (TOPAMAX ) 100 MG tablet, Take 2 tablets (200 mg total) by mouth at bedtime., Disp: 60 tablet, Rfl: 5   Ubrogepant  (UBRELVY ) 100 MG TABS, Take 1 tablet (100 mg total) by mouth as needed. May repeat after 2 hours.  Maximum 2 tablets., Disp: 16 tablet, Rfl: 5   Vitamin D , Ergocalciferol , (DRISDOL ) 1.25 MG (50000 UNIT) CAPS capsule, TAKE 1 CAPSULE BY MOUTH EVERY 7 DAYS **NOT COVERED**, Disp: 12 capsule, Rfl: 0   albuterol  (VENTOLIN  HFA) 108 (90 Base) MCG/ACT inhaler, Inhale 2 puffs into the lungs as needed. (Patient not taking: Reported on 05/08/2024), Disp: , Rfl:    atorvastatin  (LIPITOR) 10 MG tablet, Take 1 tablet (10 mg total) by mouth daily., Disp: 90 tablet, Rfl: 1   buPROPion  (WELLBUTRIN  XL) 150 MG 24 hr tablet, Take 1 tablet (150 mg total) by mouth daily., Disp: 90  tablet, Rfl: 1   clonazePAM  (KLONOPIN ) 0.5 MG tablet, Take 1 tablet (0.5 mg total) by mouth daily as needed., Disp: 30 tablet, Rfl: 1   eszopiclone  (LUNESTA ) 2 MG TABS tablet, Take 1 tablet (2 mg total) by mouth at bedtime. Take immediately before bedtime, Disp: 30 tablet, Rfl: 5   fluconazole  (DIFLUCAN ) 100 MG tablet, Take 1 tablet (100 mg total) by mouth daily. Take 1 tablet by mouth now repeat in 5 days (Patient not taking: Reported on 05/08/2024), Disp: 2 tablet, Rfl: 0   guanFACINE (INTUNIV) 1 MG TB24 ER tablet, Take 1 mg by mouth daily. (Patient not taking: Reported on 05/08/2024), Disp: , Rfl:    ondansetron  (ZOFRAN ) 4 MG tablet, Take 1 tablet (4 mg total) by mouth every 8 (eight) hours as needed. (Patient not taking: Reported on 05/08/2024), Disp: 20 tablet, Rfl: 5   No Known Allergies   Review of Systems  Constitutional: Negative.   Eyes:  Negative for blurred vision.  Respiratory: Negative.    Cardiovascular: Negative.   Neurological: Negative.   Psychiatric/Behavioral: Negative.       Today's Vitals   05/08/24 0829  BP: 120/70  Pulse: 91  Temp: 98.6 F (37 C)  TempSrc: Oral  Weight: 178 lb 9.6 oz (81 kg)  Height: 5' 6 (1.676 m)  PainSc: 7   PainLoc: Generalized   Body mass index is 28.83 kg/m.  Wt Readings from Last 3 Encounters:  05/08/24 178 lb 9.6 oz (81 kg)  04/01/24 177 lb (80.3 kg)  02/07/24 183 lb (83 kg)      Objective:  Physical Exam Vitals and nursing note reviewed.  Constitutional:      General: She is not in acute distress.    Appearance: Normal appearance. She is well-developed.  HENT:     Head: Normocephalic and atraumatic.  Eyes:     Pupils: Pupils are equal, round, and reactive to light.  Cardiovascular:     Rate and Rhythm: Normal rate and regular rhythm.     Pulses: Normal pulses.     Heart sounds: Normal heart  sounds. No murmur heard. Pulmonary:     Effort: Pulmonary effort is normal. No respiratory distress.     Breath sounds: Normal  breath sounds. No wheezing.  Musculoskeletal:        General: No tenderness.  Skin:    General: Skin is warm and dry.     Capillary Refill: Capillary refill takes less than 2 seconds.  Neurological:     General: No focal deficit present.     Mental Status: She is alert and oriented to person, place, and time.     Cranial Nerves: No cranial nerve deficit.     Motor: No weakness.  Psychiatric:        Mood and Affect: Mood normal.        Behavior: Behavior normal.        Thought Content: Thought content normal.        Judgment: Judgment normal.       Assessment And Plan:  Type 2 diabetes mellitus with obesity (HCC) Assessment & Plan: Stable. continue current medications.  Decreased sensation in right foot, blood sugars have been okay, eye exam appears up to date. - Order A1c and cholesterol tests. - Advise regular foot checks for open areas or lesions. - Instruct to keep feet moisturized and avoid walking barefoot. Diabetic foot exam done  Orders: -     Hemoglobin A1c -     Lipid panel -     Atorvastatin  Calcium ; Take 1 tablet (10 mg total) by mouth daily.  Dispense: 90 tablet; Refill: 1  Benign hypertensive heart disease with congestive heart failure and with combined systolic and diastolic dysfunction (HCC) Assessment & Plan: Blood pressure is well controlled, continue current medications and f/u with cardiology   Need for influenza vaccination Assessment & Plan: Influenza vaccine administered Encouraged to take Tylenol  as needed for fever or muscle aches.   Orders: -     Flu vaccine trivalent PF, 6mos and older(Flulaval,Afluria,Fluarix,Fluzone)  COVID-19 vaccination declined  Overweight with body mass index (BMI) of 28 to 28.9 in adult  Encounter for screening -     Hepatitis B surface antibody,qualitative  Anxiety Assessment & Plan: She had her last refill in April 2025. Neurologist started azulfidine  around end of July; otherwise, no changes in management  discussed here. - Refill clonazepam  prescription.  Orders: -     clonazePAM ; Take 1 tablet (0.5 mg total) by mouth daily as needed.  Dispense: 30 tablet; Refill: 1 -     buPROPion  HCl ER (XL); Take 1 tablet (150 mg total) by mouth daily.  Dispense: 90 tablet; Refill: 1  Recurrent major depressive disorder, in partial remission (HCC) -     buPROPion  HCl ER (XL); Take 1 tablet (150 mg total) by mouth daily.  Dispense: 90 tablet; Refill: 1  Other insomnia -     Eszopiclone ; Take 1 tablet (2 mg total) by mouth at bedtime. Take immediately before bedtime  Dispense: 30 tablet; Refill: 5      Return for keep same next.  Patient was given opportunity to ask questions. Patient verbalized understanding of the plan and was able to repeat key elements of the plan. All questions were answered to their satisfaction.    LILLETTE Virginia Ada, FNP, have reviewed all documentation for this visit. The documentation on 05/08/24 for the exam, diagnosis, procedures, and orders are all accurate and complete.   IF YOU HAVE BEEN REFERRED TO A SPECIALIST, IT MAY TAKE 1-2 WEEKS TO SCHEDULE/PROCESS THE REFERRAL. IF YOU HAVE  NOT HEARD FROM US /SPECIALIST IN TWO WEEKS, PLEASE GIVE US  A CALL AT [phone].

## 2024-05-08 ENCOUNTER — Ambulatory Visit (INDEPENDENT_AMBULATORY_CARE_PROVIDER_SITE_OTHER): Admitting: Nurse Practitioner

## 2024-05-08 ENCOUNTER — Encounter: Payer: Self-pay | Admitting: Nurse Practitioner

## 2024-05-08 VITALS — BP 120/70 | HR 91 | Temp 98.6°F | Ht 66.0 in | Wt 178.6 lb

## 2024-05-08 DIAGNOSIS — I110 Hypertensive heart disease with heart failure: Secondary | ICD-10-CM

## 2024-05-08 DIAGNOSIS — E1169 Type 2 diabetes mellitus with other specified complication: Secondary | ICD-10-CM | POA: Diagnosis not present

## 2024-05-08 DIAGNOSIS — I5040 Unspecified combined systolic (congestive) and diastolic (congestive) heart failure: Secondary | ICD-10-CM

## 2024-05-08 DIAGNOSIS — Z139 Encounter for screening, unspecified: Secondary | ICD-10-CM

## 2024-05-08 DIAGNOSIS — E119 Type 2 diabetes mellitus without complications: Secondary | ICD-10-CM

## 2024-05-08 DIAGNOSIS — F3341 Major depressive disorder, recurrent, in partial remission: Secondary | ICD-10-CM

## 2024-05-08 DIAGNOSIS — F419 Anxiety disorder, unspecified: Secondary | ICD-10-CM | POA: Diagnosis not present

## 2024-05-08 DIAGNOSIS — Z23 Encounter for immunization: Secondary | ICD-10-CM

## 2024-05-08 DIAGNOSIS — G4709 Other insomnia: Secondary | ICD-10-CM

## 2024-05-08 DIAGNOSIS — Z6828 Body mass index (BMI) 28.0-28.9, adult: Secondary | ICD-10-CM

## 2024-05-08 DIAGNOSIS — Z2821 Immunization not carried out because of patient refusal: Secondary | ICD-10-CM

## 2024-05-08 DIAGNOSIS — E663 Overweight: Secondary | ICD-10-CM

## 2024-05-08 MED ORDER — ATORVASTATIN CALCIUM 10 MG PO TABS
10.0000 mg | ORAL_TABLET | Freq: Every day | ORAL | 1 refills | Status: AC
Start: 2024-05-08 — End: ?

## 2024-05-08 MED ORDER — BUPROPION HCL ER (XL) 150 MG PO TB24
150.0000 mg | ORAL_TABLET | Freq: Every day | ORAL | 1 refills | Status: AC
Start: 2024-05-08 — End: ?

## 2024-05-08 MED ORDER — ESZOPICLONE 2 MG PO TABS: 2.0000 mg | ORAL_TABLET | Freq: Every day | ORAL | 5 refills | Status: AC

## 2024-05-08 MED ORDER — CLONAZEPAM 0.5 MG PO TABS: 0.5000 mg | ORAL_TABLET | Freq: Every day | ORAL | 1 refills | Status: AC | PRN

## 2024-05-08 NOTE — Assessment & Plan Note (Signed)
 She had her last refill in April 2025. Neurologist started azulfidine  around end of July; otherwise, no changes in management discussed here. - Refill clonazepam  prescription.

## 2024-05-08 NOTE — Assessment & Plan Note (Signed)
 Blood pressure is well controlled, continue current medications and f/u with cardiology

## 2024-05-08 NOTE — Assessment & Plan Note (Addendum)
 Stable. continue current medications.  Decreased sensation in right foot, blood sugars have been okay, eye exam appears up to date. - Order A1c and cholesterol tests. - Advise regular foot checks for open areas or lesions. - Instruct to keep feet moisturized and avoid walking barefoot. Diabetic foot exam done

## 2024-05-09 ENCOUNTER — Ambulatory Visit: Admitting: Physician Assistant

## 2024-05-09 LAB — HEPATITIS B SURFACE ANTIBODY,QUALITATIVE

## 2024-05-09 LAB — LIPID PANEL
Chol/HDL Ratio: 3.6 ratio (ref 0.0–4.4)
Cholesterol, Total: 196 mg/dL (ref 100–199)
HDL: 55 mg/dL (ref >39)
LDL Chol Calc (NIH): 127 mg/dL — ABNORMAL HIGH (ref 0–99)
Triglycerides: 76 mg/dL (ref 0–149)
VLDL Cholesterol Cal: 14 mg/dL (ref 5–40)

## 2024-05-09 LAB — HEMOGLOBIN A1C
Est. average glucose Bld gHb Est-mCnc: 111 mg/dL
Hgb A1c MFr Bld: 5.5 % (ref 4.8–5.6)

## 2024-05-14 NOTE — Progress Notes (Deleted)
 NEUROLOGY FOLLOW UP OFFICE NOTE  Soliyana Mcchristian Grimmer [id number]  Assessment/Plan:   Migraine without aura, without status migrainosus, not intractable Bilateral sigmoid sinus stenosis s/p stent bilaterally/left transverse sinus stent (March 2023) Left PCA AVM s/p stereotactic surgery - followed at [facility]. History of idiopathic intracranial hypertension    Migraine prevention:  Emgality  every 30 days, topiramate  200mg  at bedtime Migraine rescue:  Ubrelvy  100mg , Zofran  4mg  IIH therapy:  Topiramate  200mg  at bedtime Limit use of pain relievers to no more than 9 days out of the month to prevent risk of rebound or medication-overuse headache. Keep headache diary Follow up with me 6 months ***     Subjective:  Jenafer Winterton is a 48 year old right-handed woman with cerebellar AVM s/p treatment, diabetes,  and hypertension who follows up for migraines and IIH.   UPDATE: Last visit in March, increased topiramate  to achieve more optimal management and discontinued acetazolamide .  *** Intensity:  severe  Duration:  with Ubrelvy , severity significantly decreases in 45-60 minutes and will slowly dissipate after a few hours. Frequency:  4 to 6 a month. ***  Last seen by neurosurgery at [facility] in March.   Repeat MRI BRAIN W WO on 11/22/2023 demonstrated Post-treatment change of the left cerebellar arteriovenous malformation, with unchanged size of the focus of enhancement representing treated nidus with associated draining vein. The degree of surrounding edema is mildly increased, nonspecific. Plan is to continue monitoring.   Current NSAIDS:  none Current analgesics:  hydrocodone  (back pain) Other current abortive:  none Current triptans: none Current ergotamine: None Current anti-emetic: Zofran  4 mg Current muscle relaxants: none Current sleep aide:  none Current Antihypertensive medications: Carvedilol ,  Lisinopril -HCTZ Current Antidepressant medications:  Cymbalta  60mg  daily,  Wellbutrin  Current Anticonvulsant medications: Topiramate  200mg  at bedtime, Lyrica  100mg  three times daily. Current anti-CGRP:  Emgality  Current Antihistamines/Decongestants:  Zyrtec Other therapy:  Reyvow  (rescue) Hormone/birth control:  no  Other medications:  ASA 81mg , Plavix , atorvastatin , Adderall, clonazepam , Lunesta , Plaquenil      Caffeine : One soda a week Diet: Hydrates Exercise: Yes Depression: Yes; Anxiety: Yes Other pain: Diffuse joint pain, shoulder and back pain.  Has Raynaud's.  ANA positive. Seen by rheumatology.  No specific diagnosis established. Sleep hygiene: Varies   HISTORY: Onset: Remote history of migraines.  Controlled for many years.  Returned in May 2019.SABRA Location:  Right sided head/ear radiating down right side of neck.  Does not radiate down the right arm. Quality:  Pounding in head, burning in neck Initial intensity:  Severe.  She denies  thunderclap headache or severe headache that wakes her from sleep. Aura:  no Prodrome:  no Postdrome:  no Associated symptoms:  Nausea, vomiting, photophobia, phonophobia, blurred vision.  She denies associated unilateral numbness or weakness. Initial duration:  1 day Initial Frequency:  2 days a week Initial Frequency of abortive medication: 2 days a week Triggers:  Emotional stress Relieving factors:  Heating pad, Robaxin  Activity:  Aggravates   She was evaluated in the ED on 02/10/18, where CTA of head and neck was performed and personally reviewed.  It demonstrated empty sella but otherwise unremarkable for mass lesion, aneurysm or dissection.  She was diagnosed with idiopathic intracranial hypertension diagnosed in 2019.  She is on Diamox  250mg  daily.  She is followed by ophthalmology.  Exam note from 10/10/18 stated maybe slight nasal nerve elevation but no definite signs of papilledema.   She has longstanding history of numbness and tingling in the feet.  NCV-EMG of lower extremities in 2017  was normal.  She  reports left sided low back pain radiating down the side of the left leg with associated numbness in the side of leg and foot since early 2020  She had a lumbar X-ray which demonstrated mild dextroscoliosis apex L4.  No injury.  She also has endorsed right sided leg pain as well.  MRI of lumbar spine was performed on 05/09/2020 which was personally reviewed and showed mild lumbar spondylosis and degenerative disc disease with very mild bilateral foraminal stenosis at L5-S1.  She followed up with her PCP regarding right knee pain and swelling.  X-ray on 09/29/2020 was negative.  She reports generalized pain and numbness and tingling.  If she holds something, she may drop it.  Labs from 2021 include B12 388,  TSH  0.872, ANA negative, negative dsDNA, sed rate 13, CRP 10, RF negative.  Hgb A1c has increased over 2021 from 5.8 to 6.4.  Gabapentin previously used was ineffective for neuropathic pain.  She had a repeat NCV-EMG of the right upper and lower extremities on 01/04/2021, which was normal.    In June 2022, she woke up one morning and noted the right sided of her face was numb and mouth twisted.  She had difficulty drinking fluids and would drip out the side of her mouth.  She is not sure if she had trouble closing her eye or raising her eyebrow.  She had a slight headache but no facial pain, slurred speech, language difficulty or involvement of arm or leg.  It lasted about a week or a little bit longer. She had an MRI of the brain with and without contrast on 05/19/2021 which showed a small 8 mm enhancing vascular nodule within the left superior cerebellum.  She was referred to endovascular radiology.  Cerebral angiogram revealed a 3.4 mm x 4.3 mm AVM involving the left posterior cerebral artery P3 segment and high-grade left sigmoid sinus proximally and moderate to severe stenosis of right sigmoid sinus proximally.  She did endorse pulsatile tinnitus in the left ear.  She underwent endovascular revascularization of  the high-grade stenosis of the left sigmoid sinus on 06/27/2021.  Headache and pulsatile tinnitus resolved.  MRV of head on 09/26/2021 personally reviewed showed stable and patent stent placement of the distal left transverse sinus and proximal sigmoid sinus stent placement.  She also noted pulsatile tinnitus in her right ear, so she underwent endovascular revascularization of the right transverse sinus sigmoid sinus junction stenosis on 10/28/2021.  Underwent stereotactic surgery of the cerebral AVM at [facility] on 05/01/2022.    Goes to [facility] about every 6 months to follow up on the AVM:   11/02/2022 MRI BRAIN W WO: Status post treatment of the left cerebellar arteriovenous malformation, with  slight decrease in the size of draining vein. The adjacent small enhancing area also appears decreased, likely representing treated nidus. There is mild gliosis/edema surrounding the treated AV malformation.  05/22/2023 MRI BRAIN W TN:dujuld post gamma knife treatment of left cerebellar AVM with no significant change in the size of draining vein and small focus of enhancement, the treated nidus.  There is interval near resolution of the mild surrounding edema seen in the prior study.   Past NSAIDS:  Ibuprofen , naproxen  Past analgesics:  tramadol  50mg  Past abortive triptans: Sumatriptan  100 mg, rizatriptan  10mg , eletriptan  40mg  Past muscle relaxants:  Flexeril , Robaxin  Past anti-emetic:  no Past antihypertensive medications:  no Past antidepressant medications:  nortriptyline  50mg  (caused nausea/vomiting, problems sleeping), duloxetine  Past anticonvulsant medications:  gabapentin 300mg  twice daily (for neuropathic pain), acetazolamide  (for IIH, last dose 250mg  twice daily) Past CGRP inhibitor:  Ajovy , Aimovig  140mg  (effective/not formulary), Nurtec (effective/not covered) Past vitamins/Herbal/Supplements:  no Past antihistamines/decongestants:  no Other past therapies:  no   Family history of headache:  no   MRI  of cervical spine from 09/22/15 was personally reviewed and was unremarkable.  PAST MEDICAL HISTORY: Past Medical History:  Diagnosis Date   Acute pain of left shoulder 06/05/2019   ADHD (attention deficit hyperactivity disorder)    Anemia    iron deficiency   as a teenager   Anxiety    Arthritis    Chronic combined systolic and diastolic congestive heart failure (HCC) 11/23/2022   This is a new diagnosis with Cardiology, she is scheduled on 2/14 for her MRI of her heart.     Depression    Diabetes (HCC)    Diverticulitis 01/16/2024   Edema    GERD (gastroesophageal reflux disease)    History of congestive heart failure    dx by cardiology   Hypertension    patient denies- patient stated that the PCP said it was for heart prevention.   Left leg pain 04/07/2019   LLQ pain 12/08/2015   Migraine headache    Urinary urgency 09/16/2015    MEDICATIONS: Current Outpatient Medications on File Prior to Visit  Medication Sig Dispense Refill   albuterol  (VENTOLIN  HFA) 108 (90 Base) MCG/ACT inhaler Inhale 2 puffs into the lungs as needed. (Patient not taking: Reported on 05/08/2024)     aspirin  EC 81 MG tablet Take 81 mg by mouth daily. Swallow whole.     atorvastatin  (LIPITOR) 10 MG tablet Take 1 tablet (10 mg total) by mouth daily. 90 tablet 1   Blood Pressure Monitoring Soln KIT      buPROPion  (WELLBUTRIN  XL) 150 MG 24 hr tablet Take 1 tablet (150 mg total) by mouth daily. 90 tablet 1   carvedilol  (COREG ) 3.125 MG tablet Take 1 tablet (3.125 mg total) by mouth 2 (two) times daily. 180 tablet 3   cetirizine (ZYRTEC) 10 MG tablet Take 10 mg by mouth daily as needed for allergies.     clonazePAM  (KLONOPIN ) 0.5 MG tablet Take 1 tablet (0.5 mg total) by mouth daily as needed. 30 tablet 1   clopidogrel  (PLAVIX ) 75 MG tablet Take 1 tablet (75 mg total) by mouth daily. 90 tablet 3   DULoxetine  (CYMBALTA ) 60 MG capsule TAKE 1 CAPSULE BY MOUTH EVERY DAY 90 capsule 1   empagliflozin  (JARDIANCE ) 10  MG TABS tablet Take 1 tablet by mouth once daily in the morning 30 tablet 5   eszopiclone  (LUNESTA ) 2 MG TABS tablet Take 1 tablet (2 mg total) by mouth at bedtime. Take immediately before bedtime 30 tablet 5   famotidine  (PEPCID ) 20 MG tablet Take 1 tablet (20 mg total) by mouth 2 (two) times daily for 14 days. (Patient taking differently: Take 20 mg by mouth as needed.) 28 tablet 0   fluconazole  (DIFLUCAN ) 100 MG tablet Take 1 tablet (100 mg total) by mouth daily. Take 1 tablet by mouth now repeat in 5 days (Patient not taking: Reported on 05/08/2024) 2 tablet 0   folic acid  (FOLVITE ) 1 MG tablet Take 2 tablets (2 mg total) by mouth daily. 180 tablet 3   Galcanezumab -gnlm (EMGALITY ) 120 MG/ML SOAJ Inject 120 mg into the skin every 30 (thirty) days. 1.12 mL 11   guanFACINE (INTUNIV) 1 MG TB24 ER tablet Take 1 mg by  mouth daily. (Patient not taking: Reported on 05/08/2024)     hydroxychloroquine  (PLAQUENIL ) 200 MG tablet Take 1 tablet (200 mg total) by mouth 2 (two) times daily. 180 tablet 0   insulin  aspart (FIASP  FLEXTOUCH) 100 UNIT/ML FlexTouch Pen INJECT [place] INSULIN  BEFORE MEALS AND AT HS IF BS < 150 - 0 UNITS, 150-199 - 3 UNITS, 200-249 - 6 UNITS, 250-299 - 9 UNITS, 300-349 - 12 UNITS, >350 - 15 UNITS. IF >400 CALL OUR OFFICE WITH YOUR BLOOD SUGAR READINGS. (Patient taking differently: as needed. Inject [place] insulin  before meals and at hs if BS < 150 - 0 units, 150-199 - 3 units, 200-249 - 6 units, 250-299 - 9 units, 300-349 - 12 units, >350 - 15 units. If >400 call our office with your blood sugar readings.) 15 mL 1   Insulin  Pen Needle (PEN NEEDLES) 32G X 4 MM MISC 1 each by Does not apply route once a week. 30 each 3   Lancets (ONETOUCH DELICA PLUS LANCET33G) MISC Apply 1 each topically 3 (three) times daily.     LINZESS  72 MCG capsule TAKE 1 CAPSULE BY MOUTH DAILY BEFORE BREAKFAST. 30 capsule 5   ondansetron  (ZOFRAN ) 4 MG tablet Take 1 tablet (4 mg total) by mouth every 8 (eight) hours as needed.  (Patient not taking: Reported on 05/08/2024) 20 tablet 5   ondansetron  (ZOFRAN -ODT) 4 MG disintegrating tablet Take 1 tablet (4 mg total) by mouth every 8 (eight) hours as needed for nausea. 10 tablet 0   Polyethyl Glycol-Propyl Glycol (SYSTANE) 0.4-0.3 % SOLN Place 1 drop into both eyes daily as needed (dry eyes).     sacubitril-valsartan (ENTRESTO ) 24-26 MG Take 1 tablet by mouth 2 (two) times daily. 180 tablet 3   spironolactone  (ALDACTONE ) 25 MG tablet Take 0.5 tablets (12.5 mg total) by mouth daily. 45 tablet 3   sulfaSALAzine  (AZULFIDINE ) 500 MG tablet Take 2 tablets (1,000 mg total) by mouth 2 (two) times daily. 360 tablet 0   tirzepatide  (MOUNJARO ) 10 MG/0.5ML Pen Inject 10 mg into the skin once a week. 6 mL 1   topiramate  (TOPAMAX ) 100 MG tablet Take 2 tablets (200 mg total) by mouth at bedtime. 60 tablet 5   Ubrogepant  (UBRELVY ) 100 MG TABS Take 1 tablet (100 mg total) by mouth as needed. May repeat after 2 hours.  Maximum 2 tablets. 16 tablet 5   Vitamin D , Ergocalciferol , (DRISDOL ) 1.25 MG (50000 UNIT) CAPS capsule TAKE 1 CAPSULE BY MOUTH EVERY 7 DAYS **NOT COVERED** 12 capsule 0   [DISCONTINUED] metFORMIN  (GLUCOPHAGE -XR) 500 MG 24 hr tablet Take 2 tablets (1,000 mg total) by mouth 2 (two) times daily. (Patient not taking: Reported on 05/08/2024) 30 tablet 0   [DISCONTINUED] Phentermine -Topiramate  11.25-69 MG CP24 Take 1 tablet by mouth daily. (Patient not taking: Reported on 05/08/2024) 30 capsule 1   No current facility-administered medications on file prior to visit.    ALLERGIES: No Known Allergies  FAMILY HISTORY: Family History  Problem Relation Age of Onset   Diabetes Mother    Hypertension Mother    Diabetes Father    Hypertension Father    Kidney failure Father    Anxiety disorder Father    Hypertension Brother    Cancer Maternal Grandmother    Diabetes Maternal Grandmother    Pancreatic cancer Maternal Grandmother    Arthritis Maternal Grandmother    Diabetes Paternal  Grandmother    Breast cancer Paternal Grandmother    Stroke Paternal Grandfather    Healthy Son  ADD / ADHD Son    Healthy Son    Asthma Son    Cancer Maternal Aunt        breast   Breast cancer Maternal Aunt    Birth defects Maternal Aunt       Objective:  *** General: No acute distress.  Patient appears well-groomed.   Head:  Normocephalic/atraumatic Neck:  Supple.  No paraspinal tenderness.  Full range of motion. Heart:  Regular rate and rhythm. Neuro:  Alert and oriented.  Speech fluent and not dysarthric.  Language intact.  CN II-XII intact.  Bulk and tone normal.  Muscle strength 5/5 throughout.  Sensation to light touch intact.  Deep tendon reflexes 2+ throughout, toes downgoing.  Gait normal.  Romberg negative.     Juliene Dunnings, DO  CC: [name], FNP

## 2024-05-15 ENCOUNTER — Ambulatory Visit: Admitting: Neurology

## 2024-05-15 ENCOUNTER — Ambulatory Visit

## 2024-05-20 DIAGNOSIS — Z23 Encounter for immunization: Secondary | ICD-10-CM | POA: Insufficient documentation

## 2024-05-20 NOTE — Assessment & Plan Note (Signed)
 Influenza vaccine administered Encouraged to take Tylenol as needed for fever or muscle aches.

## 2024-05-21 NOTE — Progress Notes (Deleted)
 NEUROLOGY FOLLOW UP OFFICE NOTE  Soliyana Mcchristian Grimmer [id number]  Assessment/Plan:   Migraine without aura, without status migrainosus, not intractable Bilateral sigmoid sinus stenosis s/p stent bilaterally/left transverse sinus stent (March 2023) Left PCA AVM s/p stereotactic surgery - followed at [facility]. History of idiopathic intracranial hypertension    Migraine prevention:  Emgality  every 30 days, topiramate  200mg  at bedtime Migraine rescue:  Ubrelvy  100mg , Zofran  4mg  IIH therapy:  Topiramate  200mg  at bedtime Limit use of pain relievers to no more than 9 days out of the month to prevent risk of rebound or medication-overuse headache. Keep headache diary Follow up with me 6 months ***     Subjective:  Jenafer Winterton is a 48 year old right-handed woman with cerebellar AVM s/p treatment, diabetes,  and hypertension who follows up for migraines and IIH.   UPDATE: Last visit in March, increased topiramate  to achieve more optimal management and discontinued acetazolamide .  *** Intensity:  severe  Duration:  with Ubrelvy , severity significantly decreases in 45-60 minutes and will slowly dissipate after a few hours. Frequency:  4 to 6 a month. ***  Last seen by neurosurgery at [facility] in March.   Repeat MRI BRAIN W WO on 11/22/2023 demonstrated Post-treatment change of the left cerebellar arteriovenous malformation, with unchanged size of the focus of enhancement representing treated nidus with associated draining vein. The degree of surrounding edema is mildly increased, nonspecific. Plan is to continue monitoring.   Current NSAIDS:  none Current analgesics:  hydrocodone  (back pain) Other current abortive:  none Current triptans: none Current ergotamine: None Current anti-emetic: Zofran  4 mg Current muscle relaxants: none Current sleep aide:  none Current Antihypertensive medications: Carvedilol ,  Lisinopril -HCTZ Current Antidepressant medications:  Cymbalta  60mg  daily,  Wellbutrin  Current Anticonvulsant medications: Topiramate  200mg  at bedtime, Lyrica  100mg  three times daily. Current anti-CGRP:  Emgality  Current Antihistamines/Decongestants:  Zyrtec Other therapy:  Reyvow  (rescue) Hormone/birth control:  no  Other medications:  ASA 81mg , Plavix , atorvastatin , Adderall, clonazepam , Lunesta , Plaquenil      Caffeine : One soda a week Diet: Hydrates Exercise: Yes Depression: Yes; Anxiety: Yes Other pain: Diffuse joint pain, shoulder and back pain.  Has Raynaud's.  ANA positive. Seen by rheumatology.  No specific diagnosis established. Sleep hygiene: Varies   HISTORY: Onset: Remote history of migraines.  Controlled for many years.  Returned in May 2019.SABRA Location:  Right sided head/ear radiating down right side of neck.  Does not radiate down the right arm. Quality:  Pounding in head, burning in neck Initial intensity:  Severe.  She denies  thunderclap headache or severe headache that wakes her from sleep. Aura:  no Prodrome:  no Postdrome:  no Associated symptoms:  Nausea, vomiting, photophobia, phonophobia, blurred vision.  She denies associated unilateral numbness or weakness. Initial duration:  1 day Initial Frequency:  2 days a week Initial Frequency of abortive medication: 2 days a week Triggers:  Emotional stress Relieving factors:  Heating pad, Robaxin  Activity:  Aggravates   She was evaluated in the ED on 02/10/18, where CTA of head and neck was performed and personally reviewed.  It demonstrated empty sella but otherwise unremarkable for mass lesion, aneurysm or dissection.  She was diagnosed with idiopathic intracranial hypertension diagnosed in 2019.  She is on Diamox  250mg  daily.  She is followed by ophthalmology.  Exam note from 10/10/18 stated maybe slight nasal nerve elevation but no definite signs of papilledema.   She has longstanding history of numbness and tingling in the feet.  NCV-EMG of lower extremities in 2017  was normal.  She  reports left sided low back pain radiating down the side of the left leg with associated numbness in the side of leg and foot since early 2020  She had a lumbar X-ray which demonstrated mild dextroscoliosis apex L4.  No injury.  She also has endorsed right sided leg pain as well.  MRI of lumbar spine was performed on 05/09/2020 which was personally reviewed and showed mild lumbar spondylosis and degenerative disc disease with very mild bilateral foraminal stenosis at L5-S1.  She followed up with her PCP regarding right knee pain and swelling.  X-ray on 09/29/2020 was negative.  She reports generalized pain and numbness and tingling.  If she holds something, she may drop it.  Labs from 2021 include B12 388,  TSH  0.872, ANA negative, negative dsDNA, sed rate 13, CRP 10, RF negative.  Hgb A1c has increased over 2021 from 5.8 to 6.4.  Gabapentin previously used was ineffective for neuropathic pain.  She had a repeat NCV-EMG of the right upper and lower extremities on 01/04/2021, which was normal.    In June 2022, she woke up one morning and noted the right sided of her face was numb and mouth twisted.  She had difficulty drinking fluids and would drip out the side of her mouth.  She is not sure if she had trouble closing her eye or raising her eyebrow.  She had a slight headache but no facial pain, slurred speech, language difficulty or involvement of arm or leg.  It lasted about a week or a little bit longer. She had an MRI of the brain with and without contrast on 05/19/2021 which showed a small 8 mm enhancing vascular nodule within the left superior cerebellum.  She was referred to endovascular radiology.  Cerebral angiogram revealed a 3.4 mm x 4.3 mm AVM involving the left posterior cerebral artery P3 segment and high-grade left sigmoid sinus proximally and moderate to severe stenosis of right sigmoid sinus proximally.  She did endorse pulsatile tinnitus in the left ear.  She underwent endovascular revascularization of  the high-grade stenosis of the left sigmoid sinus on 06/27/2021.  Headache and pulsatile tinnitus resolved.  MRV of head on 09/26/2021 personally reviewed showed stable and patent stent placement of the distal left transverse sinus and proximal sigmoid sinus stent placement.  She also noted pulsatile tinnitus in her right ear, so she underwent endovascular revascularization of the right transverse sinus sigmoid sinus junction stenosis on 10/28/2021.  Underwent stereotactic surgery of the cerebral AVM at [facility] on 05/01/2022.    Goes to [facility] about every 6 months to follow up on the AVM:   11/02/2022 MRI BRAIN W WO: Status post treatment of the left cerebellar arteriovenous malformation, with  slight decrease in the size of draining vein. The adjacent small enhancing area also appears decreased, likely representing treated nidus. There is mild gliosis/edema surrounding the treated AV malformation.  05/22/2023 MRI BRAIN W TN:dujuld post gamma knife treatment of left cerebellar AVM with no significant change in the size of draining vein and small focus of enhancement, the treated nidus.  There is interval near resolution of the mild surrounding edema seen in the prior study.   Past NSAIDS:  Ibuprofen , naproxen  Past analgesics:  tramadol  50mg  Past abortive triptans: Sumatriptan  100 mg, rizatriptan  10mg , eletriptan  40mg  Past muscle relaxants:  Flexeril , Robaxin  Past anti-emetic:  no Past antihypertensive medications:  no Past antidepressant medications:  nortriptyline  50mg  (caused nausea/vomiting, problems sleeping), duloxetine  Past anticonvulsant medications:  gabapentin 300mg  twice daily (for neuropathic pain), acetazolamide  (for IIH, last dose 250mg  twice daily) Past CGRP inhibitor:  Ajovy , Aimovig  140mg  (effective/not formulary), Nurtec (effective/not covered) Past vitamins/Herbal/Supplements:  no Past antihistamines/decongestants:  no Other past therapies:  no   Family history of headache:  no   MRI  of cervical spine from 09/22/15 was personally reviewed and was unremarkable.  PAST MEDICAL HISTORY: Past Medical History:  Diagnosis Date   Acute pain of left shoulder 06/05/2019   ADHD (attention deficit hyperactivity disorder)    Anemia    iron deficiency   as a teenager   Anxiety    Arthritis    Chronic combined systolic and diastolic congestive heart failure (HCC) 11/23/2022   This is a new diagnosis with Cardiology, she is scheduled on 2/14 for her MRI of her heart.     Depression    Diabetes (HCC)    Diverticulitis 01/16/2024   Edema    GERD (gastroesophageal reflux disease)    History of congestive heart failure    dx by cardiology   Hypertension    patient denies- patient stated that the PCP said it was for heart prevention.   Left leg pain 04/07/2019   LLQ pain 12/08/2015   Migraine headache    Urinary urgency 09/16/2015    MEDICATIONS: Current Outpatient Medications on File Prior to Visit  Medication Sig Dispense Refill   albuterol  (VENTOLIN  HFA) 108 (90 Base) MCG/ACT inhaler Inhale 2 puffs into the lungs as needed. (Patient not taking: Reported on 05/08/2024)     aspirin  EC 81 MG tablet Take 81 mg by mouth daily. Swallow whole.     atorvastatin  (LIPITOR) 10 MG tablet Take 1 tablet (10 mg total) by mouth daily. 90 tablet 1   Blood Pressure Monitoring Soln KIT      buPROPion  (WELLBUTRIN  XL) 150 MG 24 hr tablet Take 1 tablet (150 mg total) by mouth daily. 90 tablet 1   carvedilol  (COREG ) 3.125 MG tablet Take 1 tablet (3.125 mg total) by mouth 2 (two) times daily. 180 tablet 3   cetirizine (ZYRTEC) 10 MG tablet Take 10 mg by mouth daily as needed for allergies.     clonazePAM  (KLONOPIN ) 0.5 MG tablet Take 1 tablet (0.5 mg total) by mouth daily as needed. 30 tablet 1   clopidogrel  (PLAVIX ) 75 MG tablet Take 1 tablet (75 mg total) by mouth daily. 90 tablet 3   DULoxetine  (CYMBALTA ) 60 MG capsule TAKE 1 CAPSULE BY MOUTH EVERY DAY 90 capsule 1   empagliflozin  (JARDIANCE ) 10  MG TABS tablet Take 1 tablet by mouth once daily in the morning 30 tablet 5   eszopiclone  (LUNESTA ) 2 MG TABS tablet Take 1 tablet (2 mg total) by mouth at bedtime. Take immediately before bedtime 30 tablet 5   famotidine  (PEPCID ) 20 MG tablet Take 1 tablet (20 mg total) by mouth 2 (two) times daily for 14 days. (Patient taking differently: Take 20 mg by mouth as needed.) 28 tablet 0   fluconazole  (DIFLUCAN ) 100 MG tablet Take 1 tablet (100 mg total) by mouth daily. Take 1 tablet by mouth now repeat in 5 days (Patient not taking: Reported on 05/08/2024) 2 tablet 0   folic acid  (FOLVITE ) 1 MG tablet Take 2 tablets (2 mg total) by mouth daily. 180 tablet 3   Galcanezumab -gnlm (EMGALITY ) 120 MG/ML SOAJ Inject 120 mg into the skin every 30 (thirty) days. 1.12 mL 11   guanFACINE (INTUNIV) 1 MG TB24 ER tablet Take 1 mg by  mouth daily. (Patient not taking: Reported on 05/08/2024)     hydroxychloroquine  (PLAQUENIL ) 200 MG tablet Take 1 tablet (200 mg total) by mouth 2 (two) times daily. 180 tablet 0   insulin  aspart (FIASP  FLEXTOUCH) 100 UNIT/ML FlexTouch Pen INJECT [place] INSULIN  BEFORE MEALS AND AT HS IF BS < 150 - 0 UNITS, 150-199 - 3 UNITS, 200-249 - 6 UNITS, 250-299 - 9 UNITS, 300-349 - 12 UNITS, >350 - 15 UNITS. IF >400 CALL OUR OFFICE WITH YOUR BLOOD SUGAR READINGS. (Patient taking differently: as needed. Inject [place] insulin  before meals and at hs if BS < 150 - 0 units, 150-199 - 3 units, 200-249 - 6 units, 250-299 - 9 units, 300-349 - 12 units, >350 - 15 units. If >400 call our office with your blood sugar readings.) 15 mL 1   Insulin  Pen Needle (PEN NEEDLES) 32G X 4 MM MISC 1 each by Does not apply route once a week. 30 each 3   Lancets (ONETOUCH DELICA PLUS LANCET33G) MISC Apply 1 each topically 3 (three) times daily.     LINZESS  72 MCG capsule TAKE 1 CAPSULE BY MOUTH DAILY BEFORE BREAKFAST. 30 capsule 5   ondansetron  (ZOFRAN ) 4 MG tablet Take 1 tablet (4 mg total) by mouth every 8 (eight) hours as needed.  (Patient not taking: Reported on 05/08/2024) 20 tablet 5   ondansetron  (ZOFRAN -ODT) 4 MG disintegrating tablet Take 1 tablet (4 mg total) by mouth every 8 (eight) hours as needed for nausea. 10 tablet 0   Polyethyl Glycol-Propyl Glycol (SYSTANE) 0.4-0.3 % SOLN Place 1 drop into both eyes daily as needed (dry eyes).     sacubitril-valsartan (ENTRESTO ) 24-26 MG Take 1 tablet by mouth 2 (two) times daily. 180 tablet 3   spironolactone  (ALDACTONE ) 25 MG tablet Take 0.5 tablets (12.5 mg total) by mouth daily. 45 tablet 3   sulfaSALAzine  (AZULFIDINE ) 500 MG tablet Take 2 tablets (1,000 mg total) by mouth 2 (two) times daily. 360 tablet 0   tirzepatide  (MOUNJARO ) 10 MG/0.5ML Pen Inject 10 mg into the skin once a week. 6 mL 1   topiramate  (TOPAMAX ) 100 MG tablet Take 2 tablets (200 mg total) by mouth at bedtime. 60 tablet 5   Ubrogepant  (UBRELVY ) 100 MG TABS Take 1 tablet (100 mg total) by mouth as needed. May repeat after 2 hours.  Maximum 2 tablets. 16 tablet 5   Vitamin D , Ergocalciferol , (DRISDOL ) 1.25 MG (50000 UNIT) CAPS capsule TAKE 1 CAPSULE BY MOUTH EVERY 7 DAYS **NOT COVERED** 12 capsule 0   [DISCONTINUED] metFORMIN  (GLUCOPHAGE -XR) 500 MG 24 hr tablet Take 2 tablets (1,000 mg total) by mouth 2 (two) times daily. (Patient not taking: Reported on 05/08/2024) 30 tablet 0   [DISCONTINUED] Phentermine -Topiramate  11.25-69 MG CP24 Take 1 tablet by mouth daily. (Patient not taking: Reported on 05/08/2024) 30 capsule 1   No current facility-administered medications on file prior to visit.    ALLERGIES: No Known Allergies  FAMILY HISTORY: Family History  Problem Relation Age of Onset   Diabetes Mother    Hypertension Mother    Diabetes Father    Hypertension Father    Kidney failure Father    Anxiety disorder Father    Hypertension Brother    Cancer Maternal Grandmother    Diabetes Maternal Grandmother    Pancreatic cancer Maternal Grandmother    Arthritis Maternal Grandmother    Diabetes Paternal  Grandmother    Breast cancer Paternal Grandmother    Stroke Paternal Grandfather    Healthy Son  ADD / ADHD Son    Healthy Son    Asthma Son    Cancer Maternal Aunt        breast   Breast cancer Maternal Aunt    Birth defects Maternal Aunt       Objective:  *** General: No acute distress.  Patient appears well-groomed.   Head:  Normocephalic/atraumatic Neck:  Supple.  No paraspinal tenderness.  Full range of motion. Heart:  Regular rate and rhythm. Neuro:  Alert and oriented.  Speech fluent and not dysarthric.  Language intact.  CN II-XII intact.  Bulk and tone normal.  Muscle strength 5/5 throughout.  Sensation to light touch intact.  Deep tendon reflexes 2+ throughout, toes downgoing.  Gait normal.  Romberg negative.     Juliene Dunnings, DO  CC: [name], FNP

## 2024-05-23 ENCOUNTER — Encounter: Payer: Self-pay | Admitting: Neurology

## 2024-05-23 ENCOUNTER — Ambulatory Visit: Admitting: Neurology

## 2024-05-28 ENCOUNTER — Ambulatory Visit
Admission: RE | Admit: 2024-05-28 | Discharge: 2024-05-28 | Disposition: A | Discharge status: Home / Self Care | Source: Ambulatory Visit | Attending: Nurse Practitioner | Admitting: Nurse Practitioner

## 2024-05-28 DIAGNOSIS — Z1231 Encounter for screening mammogram for malignant neoplasm of breast: Secondary | ICD-10-CM

## 2024-05-29 ENCOUNTER — Ambulatory Visit: Payer: Self-pay | Admitting: Nurse Practitioner

## 2024-05-29 ENCOUNTER — Encounter: Admitting: Obstetrics & Gynecology

## 2024-06-02 ENCOUNTER — Encounter: Payer: Self-pay | Admitting: Nurse Practitioner

## 2024-06-03 NOTE — Progress Notes (Unsigned)
 [name], CMA,acting as a Neurosurgeon for [name], FNP.,have documented all relevant documentation on the behalf of [name], FNP,as directed by  [name], FNP while in the presence of [name], FNP.  Subjective:  Patient ID: Virginia Douglas , female    DOB: [birth year] , 48 y.o.   MRN: [id number]  No chief complaint on file.   HPI  HPI   Past Medical History:  Diagnosis Date   Acute pain of left shoulder 06/05/2019   ADHD (attention deficit hyperactivity disorder)    Anemia    iron deficiency   as a teenager   Anxiety    Arthritis    Chronic combined systolic and diastolic congestive heart failure (HCC) 11/23/2022   This is a new diagnosis with Cardiology, she is scheduled on 2/14 for her MRI of her heart.     Depression    Diabetes (HCC)    Diverticulitis 01/16/2024   Edema    GERD (gastroesophageal reflux disease)    History of congestive heart failure    dx by cardiology   Hypertension    patient denies- patient stated that the PCP said it was for heart prevention.   Left leg pain 04/07/2019   LLQ pain 12/08/2015   Migraine headache    Urinary urgency 09/16/2015     Family History  Problem Relation Age of Onset   Diabetes Mother    Hypertension Mother    Diabetes Father    Hypertension Father    Kidney failure Father    Anxiety disorder Father    Hypertension Brother    Cancer Maternal Grandmother    Diabetes Maternal Grandmother    Pancreatic cancer Maternal Grandmother    Arthritis Maternal Grandmother    Diabetes Paternal Grandmother    Breast cancer Paternal Grandmother    Stroke Paternal Grandfather    Healthy Son    ADD / ADHD Son    Healthy Son    Asthma Son    Cancer Maternal Aunt        breast   Breast cancer Maternal Aunt    Birth defects Maternal Aunt      Current Outpatient Medications:    albuterol  (VENTOLIN  HFA) 108 (90 Base) MCG/ACT inhaler, Inhale 2 puffs into the lungs as needed. (Patient not taking:  Reported on 05/08/2024), Disp: , Rfl:    aspirin  EC 81 MG tablet, Take 81 mg by mouth daily. Swallow whole., Disp: , Rfl:    atorvastatin  (LIPITOR) 10 MG tablet, Take 1 tablet (10 mg total) by mouth daily., Disp: 90 tablet, Rfl: 1   Blood Pressure Monitoring Soln KIT, , Disp: , Rfl:    buPROPion  (WELLBUTRIN  XL) 150 MG 24 hr tablet, Take 1 tablet (150 mg total) by mouth daily., Disp: 90 tablet, Rfl: 1   carvedilol  (COREG ) 3.125 MG tablet, Take 1 tablet (3.125 mg total) by mouth 2 (two) times daily., Disp: 180 tablet, Rfl: 3   cetirizine (ZYRTEC) 10 MG tablet, Take 10 mg by mouth daily as needed for allergies., Disp: , Rfl:    clonazePAM  (KLONOPIN ) 0.5 MG tablet, Take 1 tablet (0.5 mg total) by mouth daily as needed., Disp: 30 tablet, Rfl: 1   clopidogrel  (PLAVIX ) 75 MG tablet, Take 1 tablet (75 mg total) by mouth daily., Disp: 90 tablet, Rfl: 3   DULoxetine  (CYMBALTA ) 60 MG capsule, TAKE 1 CAPSULE BY MOUTH EVERY DAY, Disp: 90 capsule, Rfl: 1   empagliflozin  (JARDIANCE ) 10 MG TABS tablet, Take 1 tablet by  mouth once daily in the morning, Disp: 30 tablet, Rfl: 5   eszopiclone  (LUNESTA ) 2 MG TABS tablet, Take 1 tablet (2 mg total) by mouth at bedtime. Take immediately before bedtime, Disp: 30 tablet, Rfl: 5   famotidine  (PEPCID ) 20 MG tablet, Take 1 tablet (20 mg total) by mouth 2 (two) times daily for 14 days. (Patient taking differently: Take 20 mg by mouth as needed.), Disp: 28 tablet, Rfl: 0   fluconazole  (DIFLUCAN ) 100 MG tablet, Take 1 tablet (100 mg total) by mouth daily. Take 1 tablet by mouth now repeat in 5 days (Patient not taking: Reported on 05/08/2024), Disp: 2 tablet, Rfl: 0   folic acid  (FOLVITE ) 1 MG tablet, Take 2 tablets (2 mg total) by mouth daily., Disp: 180 tablet, Rfl: 3   Galcanezumab -gnlm (EMGALITY ) 120 MG/ML SOAJ, Inject 120 mg into the skin every 30 (thirty) days., Disp: 1.12 mL, Rfl: 11   guanFACINE (INTUNIV) 1 MG TB24 ER tablet, Take 1 mg by mouth daily. (Patient not taking:  Reported on 05/08/2024), Disp: , Rfl:    hydroxychloroquine  (PLAQUENIL ) 200 MG tablet, Take 1 tablet (200 mg total) by mouth 2 (two) times daily., Disp: 180 tablet, Rfl: 0   insulin  aspart (FIASP  FLEXTOUCH) 100 UNIT/ML FlexTouch Pen, INJECT [place] INSULIN  BEFORE MEALS AND AT HS IF BS < 150 - 0 UNITS, 150-199 - 3 UNITS, 200-249 - 6 UNITS, 250-299 - 9 UNITS, 300-349 - 12 UNITS, >350 - 15 UNITS. IF >400 CALL OUR OFFICE WITH YOUR BLOOD SUGAR READINGS. (Patient taking differently: as needed. Inject [place] insulin  before meals and at hs if BS < 150 - 0 units, 150-199 - 3 units, 200-249 - 6 units, 250-299 - 9 units, 300-349 - 12 units, >350 - 15 units. If >400 call our office with your blood sugar readings.), Disp: 15 mL, Rfl: 1   Insulin  Pen Needle (PEN NEEDLES) 32G X 4 MM MISC, 1 each by Does not apply route once a week., Disp: 30 each, Rfl: 3   Lancets (ONETOUCH DELICA PLUS LANCET33G) MISC, Apply 1 each topically 3 (three) times daily., Disp: , Rfl:    LINZESS  72 MCG capsule, TAKE 1 CAPSULE BY MOUTH DAILY BEFORE BREAKFAST., Disp: 30 capsule, Rfl: 5   ondansetron  (ZOFRAN ) 4 MG tablet, Take 1 tablet (4 mg total) by mouth every 8 (eight) hours as needed. (Patient not taking: Reported on 05/08/2024), Disp: 20 tablet, Rfl: 5   ondansetron  (ZOFRAN -ODT) 4 MG disintegrating tablet, Take 1 tablet (4 mg total) by mouth every 8 (eight) hours as needed for nausea., Disp: 10 tablet, Rfl: 0   Polyethyl Glycol-Propyl Glycol (SYSTANE) 0.4-0.3 % SOLN, Place 1 drop into both eyes daily as needed (dry eyes)., Disp: , Rfl:    sacubitril-valsartan (ENTRESTO ) 24-26 MG, Take 1 tablet by mouth 2 (two) times daily., Disp: 180 tablet, Rfl: 3   spironolactone  (ALDACTONE ) 25 MG tablet, Take 0.5 tablets (12.5 mg total) by mouth daily., Disp: 45 tablet, Rfl: 3   sulfaSALAzine  (AZULFIDINE ) 500 MG tablet, Take 2 tablets (1,000 mg total) by mouth 2 (two) times daily., Disp: 360 tablet, Rfl: 0   tirzepatide  (MOUNJARO ) 10 MG/0.5ML Pen, Inject 10 mg into  the skin once a week., Disp: 6 mL, Rfl: 1   topiramate  (TOPAMAX ) 100 MG tablet, Take 2 tablets (200 mg total) by mouth at bedtime., Disp: 60 tablet, Rfl: 5   Ubrogepant  (UBRELVY ) 100 MG TABS, Take 1 tablet (100 mg total) by mouth as needed. May repeat after 2 hours.  Maximum  2 tablets., Disp: 16 tablet, Rfl: 5   Vitamin D , Ergocalciferol , (DRISDOL ) 1.25 MG (50000 UNIT) CAPS capsule, TAKE 1 CAPSULE BY MOUTH EVERY 7 DAYS **NOT COVERED**, Disp: 12 capsule, Rfl: 0   No Known Allergies   Review of Systems   There were no vitals filed for this visit. There is no height or weight on file to calculate BMI.  Wt Readings from Last 3 Encounters:  05/08/24 178 lb 9.6 oz (81 kg)  04/01/24 177 lb (80.3 kg)  02/07/24 183 lb (83 kg)    The 10-year ASCVD risk score (Arnett DK, et al., 2019) is: 4.7%   Values used to calculate the score:     Age: 89 years     Clincally relevant sex: Female     Is Non-Hispanic African American: Yes     Diabetic: Yes     Tobacco smoker: No     Systolic Blood Pressure: 120 mmHg     Is BP treated: Yes     HDL Cholesterol: 55 mg/dL     Total Cholesterol: 196 mg/dL  Objective:  Physical Exam      Assessment And Plan:  There are no diagnoses linked to this encounter.  No follow-ups on file.  Patient was given opportunity to ask questions. Patient verbalized understanding of the plan and was able to repeat key elements of the plan. All questions were answered to their satisfaction.    LILLETTE [name], FNP, have reviewed all documentation for this visit. The documentation on 06/03/24 for the exam, diagnosis, procedures, and orders are all accurate and complete.   IF YOU HAVE BEEN REFERRED TO A SPECIALIST, IT MAY TAKE 1-2 WEEKS TO SCHEDULE/PROCESS THE REFERRAL. IF YOU HAVE NOT HEARD FROM US /SPECIALIST IN TWO WEEKS, PLEASE GIVE US  A CALL AT [phone].

## 2024-06-04 ENCOUNTER — Encounter: Payer: Self-pay | Admitting: Nurse Practitioner

## 2024-06-04 ENCOUNTER — Ambulatory Visit (INDEPENDENT_AMBULATORY_CARE_PROVIDER_SITE_OTHER): Payer: Self-pay | Admitting: Nurse Practitioner

## 2024-06-04 VITALS — BP 120/64 | HR 91 | Temp 98.2°F | Ht 66.0 in | Wt 177.4 lb

## 2024-06-04 DIAGNOSIS — L659 Nonscarring hair loss, unspecified: Secondary | ICD-10-CM | POA: Diagnosis not present

## 2024-06-04 DIAGNOSIS — R232 Flushing: Secondary | ICD-10-CM | POA: Diagnosis not present

## 2024-06-04 DIAGNOSIS — Z139 Encounter for screening, unspecified: Secondary | ICD-10-CM | POA: Diagnosis not present

## 2024-06-04 NOTE — Assessment & Plan Note (Signed)
 She declines to take off her wig to view her hair. Describes as having more thinning around the edges of her hair line.

## 2024-06-04 NOTE — Assessment & Plan Note (Signed)
 Symptoms consistent with menopause due to reduced ovarian hormone production. Hair loss may be medication-related. Hormone therapy contraindicated due to hypertension, cancer history, or heart disease. - Order hormone level labs to assess menopausal status. - Check thyroid  function to rule out thyroid -related hair loss. - Discuss natural supplements like Estroven and black cohosh, ensuring no interactions with current medications. - Advise on lifestyle modifications: healthy diet, reduced carbs and sweets, regular exercise. - Consider Paxil or gabapentin if symptoms significantly affect daily life. - Evaluate need for dermatology referral for hair loss after lab results.

## 2024-06-05 ENCOUNTER — Encounter: Payer: Self-pay | Admitting: Nurse Practitioner

## 2024-06-05 ENCOUNTER — Ambulatory Visit: Admitting: Neurology

## 2024-06-05 ENCOUNTER — Other Ambulatory Visit: Payer: Self-pay | Admitting: Neurology

## 2024-06-05 ENCOUNTER — Ambulatory Visit: Payer: Self-pay | Admitting: Nurse Practitioner

## 2024-06-05 ENCOUNTER — Encounter: Payer: Self-pay | Admitting: Neurology

## 2024-06-05 VITALS — BP 133/83 | HR 79 | Ht 66.0 in | Wt 177.0 lb

## 2024-06-05 DIAGNOSIS — Q282 Arteriovenous malformation of cerebral vessels: Secondary | ICD-10-CM

## 2024-06-05 DIAGNOSIS — G43009 Migraine without aura, not intractable, without status migrainosus: Secondary | ICD-10-CM

## 2024-06-05 DIAGNOSIS — G932 Benign intracranial hypertension: Secondary | ICD-10-CM | POA: Diagnosis not present

## 2024-06-05 LAB — PROLACTIN: Prolactin: 4.7 ng/mL — ABNORMAL LOW (ref 4.8–33.4)

## 2024-06-05 LAB — FOLLICLE STIMULATING HORMONE: FSH: 74.6 m[IU]/mL

## 2024-06-05 LAB — ESTRADIOL: Estradiol: <5 pg/mL

## 2024-06-05 LAB — HEPATITIS B SURFACE ANTIBODY,QUALITATIVE

## 2024-06-05 LAB — LUTEINIZING HORMONE: LH: 43.5 m[IU]/mL

## 2024-06-05 LAB — TSH: TSH: 1.51 u[IU]/mL (ref 0.450–4.500)

## 2024-06-05 MED ORDER — EMGALITY 120 MG/ML ~~LOC~~ SOAJ: 120.0000 mg | SUBCUTANEOUS | 11 refills | Status: AC

## 2024-06-05 MED ORDER — TOPIRAMATE 100 MG PO TABS: 200.0000 mg | ORAL_TABLET | Freq: Every day | ORAL | 5 refills | Status: DC

## 2024-06-05 MED ORDER — UBRELVY 100 MG PO TABS: 1.0000 | ORAL_TABLET | ORAL | 5 refills | Status: AC | PRN

## 2024-06-05 NOTE — Patient Instructions (Signed)
 Emgality  Topiramate   Ubrelvy 

## 2024-06-05 NOTE — Progress Notes (Signed)
 NEUROLOGY FOLLOW UP OFFICE NOTE  Virginia Douglas [id number]  Assessment/Plan:   Migraine without aura, without status migrainosus, not intractable Bilateral sigmoid sinus stenosis s/p stent bilaterally/left transverse sinus stent (March 2023) Left PCA AVM s/p stereotactic surgery - followed at [facility]. History of idiopathic intracranial hypertension    Migraine prevention:  Emgality  every 30 days, topiramate  200mg  at bedtime Migraine rescue:  Ubrelvy  100mg , Zofran  4mg  IIH therapy:  Topiramate  200mg  at bedtime Limit use of pain relievers to no more than 9 days out of the month to prevent risk of rebound or medication-overuse headache. Annual eye exam. Keep headache diary Follow up with me in one year     Subjective:  Virginia Douglas is a 48 year old right-handed woman with cerebellar AVM s/p treatment, diabetes,  and hypertension who follows up for migraines and IIH.  Neurosurgery notes reviewed.   UPDATE: Last visit in March, increased topiramate  to achieve more optimal management and discontinued acetazolamide .  Doing well.   Intensity:  severe  Duration:  with Ubrelvy , severity significantly decreases in 45-60 minutes and will slowly dissipate after a few hours. Frequency:  4 to 6 a month.   Last seen by neurosurgery at [facility] in March.   Repeat MRI BRAIN W WO on 11/22/2023 demonstrated Post-treatment change of the left cerebellar arteriovenous malformation, with unchanged size of the focus of enhancement representing treated nidus with associated draining vein. The degree of surrounding edema is mildly increased, nonspecific. Plan is to continue monitoring.   Current NSAIDS:  none Current analgesics:  hydrocodone  (back pain) Other current abortive:  none Current triptans: none Current ergotamine: None Current anti-emetic: Zofran  4 mg Current muscle relaxants: none Current sleep aide:  none Current Antihypertensive medications: Carvedilol ,  Lisinopril -HCTZ Current  Antidepressant medications:  Cymbalta  60mg  daily, Wellbutrin  Current Anticonvulsant medications: Topiramate  200mg  at bedtime, Lyrica  100mg  three times daily. Current anti-CGRP:  Emgality  Current Antihistamines/Decongestants:  Zyrtec Other therapy:  Reyvow  (rescue) Hormone/birth control:  no  Other medications:  ASA 81mg , Plavix , atorvastatin , Adderall, clonazepam , Lunesta , Plaquenil      Caffeine : One soda a week Diet: Hydrates Exercise: Yes Depression: Yes; Anxiety: Yes Other pain: Diffuse joint pain, shoulder and back pain.  Has Raynaud's.  ANA positive. Seen by rheumatology.  No specific diagnosis established. Sleep hygiene: Varies   HISTORY: Onset: Remote history of migraines.  Controlled for many years.  Returned in May 2019.SABRA Location:  Right sided head/ear radiating down right side of neck.  Does not radiate down the right arm. Quality:  Pounding in head, burning in neck Initial intensity:  Severe.  She denies  thunderclap headache or severe headache that wakes her from sleep. Aura:  no Prodrome:  no Postdrome:  no Associated symptoms:  Nausea, vomiting, photophobia, phonophobia, blurred vision.  She denies associated unilateral numbness or weakness. Initial duration:  1 day Initial Frequency:  2 days a week Initial Frequency of abortive medication: 2 days a week Triggers:  Emotional stress Relieving factors:  Heating pad, Robaxin  Activity:  Aggravates   She was evaluated in the ED on 02/10/18, where CTA of head and neck was performed and personally reviewed.  It demonstrated empty sella but otherwise unremarkable for mass lesion, aneurysm or dissection.  She was diagnosed with idiopathic intracranial hypertension diagnosed in 2019.  She is on Diamox  250mg  daily.  She is followed by ophthalmology.  Exam note from 10/10/18 stated maybe slight nasal nerve elevation but no definite signs of papilledema.   She has longstanding history of numbness and tingling  in the feet.  NCV-EMG  of lower extremities in 2017 was normal.  She reports left sided low back pain radiating down the side of the left leg with associated numbness in the side of leg and foot since early 2020  She had a lumbar X-ray which demonstrated mild dextroscoliosis apex L4.  No injury.  She also has endorsed right sided leg pain as well.  MRI of lumbar spine was performed on 05/09/2020 which was personally reviewed and showed mild lumbar spondylosis and degenerative disc disease with very mild bilateral foraminal stenosis at L5-S1.  She followed up with her PCP regarding right knee pain and swelling.  X-ray on 09/29/2020 was negative.  She reports generalized pain and numbness and tingling.  If she holds something, she may drop it.  Labs from 2021 include B12 388,  TSH  0.872, ANA negative, negative dsDNA, sed rate 13, CRP 10, RF negative.  Hgb A1c has increased over 2021 from 5.8 to 6.4.  Gabapentin previously used was ineffective for neuropathic pain.  She had a repeat NCV-EMG of the right upper and lower extremities on 01/04/2021, which was normal.    In June 2022, she woke up one morning and noted the right sided of her face was numb and mouth twisted.  She had difficulty drinking fluids and would drip out the side of her mouth.  She is not sure if she had trouble closing her eye or raising her eyebrow.  She had a slight headache but no facial pain, slurred speech, language difficulty or involvement of arm or leg.  It lasted about a week or a little bit longer. She had an MRI of the brain with and without contrast on 05/19/2021 which showed a small 8 mm enhancing vascular nodule within the left superior cerebellum.  She was referred to endovascular radiology.  Cerebral angiogram revealed a 3.4 mm x 4.3 mm AVM involving the left posterior cerebral artery P3 segment and high-grade left sigmoid sinus proximally and moderate to severe stenosis of right sigmoid sinus proximally.  She did endorse pulsatile tinnitus in the left ear.   She underwent endovascular revascularization of the high-grade stenosis of the left sigmoid sinus on 06/27/2021.  Headache and pulsatile tinnitus resolved.  MRV of head on 09/26/2021 personally reviewed showed stable and patent stent placement of the distal left transverse sinus and proximal sigmoid sinus stent placement.  She also noted pulsatile tinnitus in her right ear, so she underwent endovascular revascularization of the right transverse sinus sigmoid sinus junction stenosis on 10/28/2021.  Underwent stereotactic surgery of the cerebral AVM at [facility] on 05/01/2022.    Goes to [facility] about every 6 months to follow up on the AVM:   11/02/2022 MRI BRAIN W WO: Status post treatment of the left cerebellar arteriovenous malformation, with  slight decrease in the size of draining vein. The adjacent small enhancing area also appears decreased, likely representing treated nidus. There is mild gliosis/edema surrounding the treated AV malformation.  05/22/2023 MRI BRAIN W TN:dujuld post gamma knife treatment of left cerebellar AVM with no significant change in the size of draining vein and small focus of enhancement, the treated nidus.  There is interval near resolution of the mild surrounding edema seen in the prior study.   Past NSAIDS:  Ibuprofen , naproxen  Past analgesics:  tramadol  50mg  Past abortive triptans: Sumatriptan  100 mg, rizatriptan  10mg , eletriptan  40mg  Past muscle relaxants:  Flexeril , Robaxin  Past anti-emetic:  no Past antihypertensive medications:  no Past antidepressant medications:  nortriptyline   50mg  (caused nausea/vomiting, problems sleeping), duloxetine  Past anticonvulsant medications:  gabapentin 300mg  twice daily (for neuropathic pain), acetazolamide  (for IIH, last dose 250mg  twice daily) Past CGRP inhibitor:  Ajovy , Aimovig  140mg  (effective/not formulary), Nurtec (effective/not covered) Past vitamins/Herbal/Supplements:  no Past antihistamines/decongestants:  no Other past therapies:   no   Family history of headache:  no   MRI of cervical spine from 09/22/15 was personally reviewed and was unremarkable.  PAST MEDICAL HISTORY: Past Medical History:  Diagnosis Date   Acute pain of left shoulder 06/05/2019   ADHD (attention deficit hyperactivity disorder)    Anemia    iron deficiency   as a teenager   Anxiety    Arthritis    Chronic combined systolic and diastolic congestive heart failure (HCC) 11/23/2022   This is a new diagnosis with Cardiology, she is scheduled on 2/14 for her MRI of her heart.     Depression    Diabetes (HCC)    Diverticulitis 01/16/2024   Edema    GERD (gastroesophageal reflux disease)    History of congestive heart failure    dx by cardiology   Hypertension    patient denies- patient stated that the PCP said it was for heart prevention.   Left leg pain 04/07/2019   LLQ pain 12/08/2015   Migraine headache    Urinary urgency 09/16/2015    MEDICATIONS: Current Outpatient Medications on File Prior to Visit  Medication Sig Dispense Refill   albuterol  (VENTOLIN  HFA) 108 (90 Base) MCG/ACT inhaler Inhale 2 puffs into the lungs as needed. (Patient not taking: Reported on 06/04/2024)     aspirin  EC 81 MG tablet Take 81 mg by mouth daily. Swallow whole.     atorvastatin  (LIPITOR) 10 MG tablet Take 1 tablet (10 mg total) by mouth daily. 90 tablet 1   Blood Pressure Monitoring Soln KIT      buPROPion  (WELLBUTRIN  XL) 150 MG 24 hr tablet Take 1 tablet (150 mg total) by mouth daily. 90 tablet 1   carvedilol  (COREG ) 3.125 MG tablet Take 1 tablet (3.125 mg total) by mouth 2 (two) times daily. 180 tablet 3   cetirizine (ZYRTEC) 10 MG tablet Take 10 mg by mouth daily as needed for allergies.     clonazePAM  (KLONOPIN ) 0.5 MG tablet Take 1 tablet (0.5 mg total) by mouth daily as needed. 30 tablet 1   clopidogrel  (PLAVIX ) 75 MG tablet Take 1 tablet (75 mg total) by mouth daily. 90 tablet 3   DULoxetine  (CYMBALTA ) 60 MG capsule TAKE 1 CAPSULE BY MOUTH EVERY  DAY 90 capsule 1   empagliflozin  (JARDIANCE ) 10 MG TABS tablet Take 1 tablet by mouth once daily in the morning 30 tablet 5   eszopiclone  (LUNESTA ) 2 MG TABS tablet Take 1 tablet (2 mg total) by mouth at bedtime. Take immediately before bedtime 30 tablet 5   famotidine  (PEPCID ) 20 MG tablet Take 1 tablet (20 mg total) by mouth 2 (two) times daily for 14 days. (Patient taking differently: Take 20 mg by mouth as needed.) 28 tablet 0   fluconazole  (DIFLUCAN ) 100 MG tablet Take 1 tablet (100 mg total) by mouth daily. Take 1 tablet by mouth now repeat in 5 days (Patient not taking: Reported on 06/04/2024) 2 tablet 0   folic acid  (FOLVITE ) 1 MG tablet Take 2 tablets (2 mg total) by mouth daily. 180 tablet 3   Galcanezumab -gnlm (EMGALITY ) 120 MG/ML SOAJ Inject 120 mg into the skin every 30 (thirty) days. 1.12 mL 11   guanFACINE (  INTUNIV) 1 MG TB24 ER tablet Take 1 mg by mouth daily. (Patient not taking: Reported on 06/04/2024)     hydroxychloroquine  (PLAQUENIL ) 200 MG tablet Take 1 tablet (200 mg total) by mouth 2 (two) times daily. 180 tablet 0   insulin  aspart (FIASP  FLEXTOUCH) 100 UNIT/ML FlexTouch Pen INJECT [place] INSULIN  BEFORE MEALS AND AT HS IF BS < 150 - 0 UNITS, 150-199 - 3 UNITS, 200-249 - 6 UNITS, 250-299 - 9 UNITS, 300-349 - 12 UNITS, >350 - 15 UNITS. IF >400 CALL OUR OFFICE WITH YOUR BLOOD SUGAR READINGS. (Patient taking differently: as needed. Inject [place] insulin  before meals and at hs if BS < 150 - 0 units, 150-199 - 3 units, 200-249 - 6 units, 250-299 - 9 units, 300-349 - 12 units, >350 - 15 units. If >400 call our office with your blood sugar readings.) 15 mL 1   Insulin  Pen Needle (PEN NEEDLES) 32G X 4 MM MISC 1 each by Does not apply route once a week. 30 each 3   Lancets (ONETOUCH DELICA PLUS LANCET33G) MISC Apply 1 each topically 3 (three) times daily.     LINZESS  72 MCG capsule TAKE 1 CAPSULE BY MOUTH DAILY BEFORE BREAKFAST. 30 capsule 5   ondansetron  (ZOFRAN ) 4 MG tablet Take 1 tablet (4 mg  total) by mouth every 8 (eight) hours as needed. (Patient not taking: Reported on 06/04/2024) 20 tablet 5   ondansetron  (ZOFRAN -ODT) 4 MG disintegrating tablet Take 1 tablet (4 mg total) by mouth every 8 (eight) hours as needed for nausea. 10 tablet 0   Polyethyl Glycol-Propyl Glycol (SYSTANE) 0.4-0.3 % SOLN Place 1 drop into both eyes daily as needed (dry eyes).     sacubitril-valsartan (ENTRESTO ) 24-26 MG Take 1 tablet by mouth 2 (two) times daily. 180 tablet 3   spironolactone  (ALDACTONE ) 25 MG tablet Take 0.5 tablets (12.5 mg total) by mouth daily. 45 tablet 3   sulfaSALAzine  (AZULFIDINE ) 500 MG tablet Take 2 tablets (1,000 mg total) by mouth 2 (two) times daily. 360 tablet 0   tirzepatide  (MOUNJARO ) 10 MG/0.5ML Pen Inject 10 mg into the skin once a week. 6 mL 1   topiramate  (TOPAMAX ) 100 MG tablet Take 2 tablets (200 mg total) by mouth at bedtime. 60 tablet 5   Ubrogepant  (UBRELVY ) 100 MG TABS Take 1 tablet (100 mg total) by mouth as needed. May repeat after 2 hours.  Maximum 2 tablets. 16 tablet 5   Vitamin D , Ergocalciferol , (DRISDOL ) 1.25 MG (50000 UNIT) CAPS capsule TAKE 1 CAPSULE BY MOUTH EVERY 7 DAYS **NOT COVERED** 12 capsule 0   [DISCONTINUED] metFORMIN  (GLUCOPHAGE -XR) 500 MG 24 hr tablet Take 2 tablets (1,000 mg total) by mouth 2 (two) times daily. (Patient not taking: Reported on 06/04/2024) 30 tablet 0   [DISCONTINUED] Phentermine -Topiramate  11.25-69 MG CP24 Take 1 tablet by mouth daily. (Patient not taking: Reported on 06/04/2024) 30 capsule 1   No current facility-administered medications on file prior to visit.    ALLERGIES: No Known Allergies  FAMILY HISTORY: Family History  Problem Relation Age of Onset   Diabetes Mother    Hypertension Mother    Diabetes Father    Hypertension Father    Kidney failure Father    Anxiety disorder Father    Hypertension Brother    Cancer Maternal Grandmother    Diabetes Maternal Grandmother    Pancreatic cancer Maternal Grandmother     Arthritis Maternal Grandmother    Diabetes Paternal Grandmother    Breast cancer Paternal Grandmother  Stroke Paternal Grandfather    Healthy Son    ADD / ADHD Son    Healthy Son    Asthma Son    Cancer Maternal Aunt        breast   Breast cancer Maternal Aunt    Birth defects Maternal Aunt       Objective:  Blood pressure 133/83, pulse 79, height 5' 6 (1.676 m), weight 177 lb (80.3 kg), SpO2 99%. General: No acute distress.  Patient appears well-groomed.   Head:  Normocephalic/atraumatic Neck:  Supple.  No paraspinal tenderness.  Full range of motion. Heart:  Regular rate and rhythm. Neuro:  Alert and oriented.  Speech fluent and not dysarthric.  Language intact.  CN II-XII intact.  Bulk and tone normal.  Muscle strength 5/5 throughout.  Sensation to light touch intact.  Deep tendon reflexes 2+ throughout, toes downgoing.  Gait normal.  Romberg negative.     Juliene Dunnings, DO  CC: [name], FNP

## 2024-06-09 ENCOUNTER — Encounter: Payer: Self-pay | Admitting: Physician Assistant

## 2024-06-09 ENCOUNTER — Ambulatory Visit: Attending: Physician Assistant | Admitting: Physician Assistant

## 2024-06-09 VITALS — BP 120/81 | HR 92 | Temp 97.6°F | Resp 12 | Ht 66.0 in | Wt 177.2 lb

## 2024-06-09 DIAGNOSIS — M0600 Rheumatoid arthritis without rheumatoid factor, unspecified site: Secondary | ICD-10-CM

## 2024-06-09 DIAGNOSIS — M19042 Primary osteoarthritis, left hand: Secondary | ICD-10-CM

## 2024-06-09 DIAGNOSIS — M79672 Pain in left foot: Secondary | ICD-10-CM

## 2024-06-09 DIAGNOSIS — R7689 Other specified abnormal immunological findings in serum: Secondary | ICD-10-CM

## 2024-06-09 DIAGNOSIS — M5030 Other cervical disc degeneration, unspecified cervical region: Secondary | ICD-10-CM

## 2024-06-09 DIAGNOSIS — F4310 Post-traumatic stress disorder, unspecified: Secondary | ICD-10-CM

## 2024-06-09 DIAGNOSIS — Z79899 Other long term (current) drug therapy: Secondary | ICD-10-CM | POA: Diagnosis not present

## 2024-06-09 DIAGNOSIS — Q282 Arteriovenous malformation of cerebral vessels: Secondary | ICD-10-CM

## 2024-06-09 DIAGNOSIS — Z8669 Personal history of other diseases of the nervous system and sense organs: Secondary | ICD-10-CM

## 2024-06-09 DIAGNOSIS — M79671 Pain in right foot: Secondary | ICD-10-CM

## 2024-06-09 DIAGNOSIS — M19041 Primary osteoarthritis, right hand: Secondary | ICD-10-CM | POA: Diagnosis not present

## 2024-06-09 DIAGNOSIS — F419 Anxiety disorder, unspecified: Secondary | ICD-10-CM

## 2024-06-09 DIAGNOSIS — G932 Benign intracranial hypertension: Secondary | ICD-10-CM

## 2024-06-09 DIAGNOSIS — I10 Essential (primary) hypertension: Secondary | ICD-10-CM

## 2024-06-09 DIAGNOSIS — Z8679 Personal history of other diseases of the circulatory system: Secondary | ICD-10-CM

## 2024-06-09 DIAGNOSIS — I7300 Raynaud's syndrome without gangrene: Secondary | ICD-10-CM

## 2024-06-09 DIAGNOSIS — E119 Type 2 diabetes mellitus without complications: Secondary | ICD-10-CM

## 2024-06-09 DIAGNOSIS — Z87891 Personal history of nicotine dependence: Secondary | ICD-10-CM

## 2024-06-09 DIAGNOSIS — K2100 Gastro-esophageal reflux disease with esophagitis, without bleeding: Secondary | ICD-10-CM

## 2024-06-09 DIAGNOSIS — M25562 Pain in left knee: Secondary | ICD-10-CM

## 2024-06-09 DIAGNOSIS — M7918 Myalgia, other site: Secondary | ICD-10-CM

## 2024-06-09 DIAGNOSIS — F32A Depression, unspecified: Secondary | ICD-10-CM

## 2024-06-09 DIAGNOSIS — M25561 Pain in right knee: Secondary | ICD-10-CM

## 2024-06-09 DIAGNOSIS — G8929 Other chronic pain: Secondary | ICD-10-CM

## 2024-06-09 DIAGNOSIS — L568 Other specified acute skin changes due to ultraviolet radiation: Secondary | ICD-10-CM

## 2024-06-09 DIAGNOSIS — F900 Attention-deficit hyperactivity disorder, predominantly inattentive type: Secondary | ICD-10-CM

## 2024-06-09 DIAGNOSIS — M51369 Other intervertebral disc degeneration, lumbar region without mention of lumbar back pain or lower extremity pain: Secondary | ICD-10-CM

## 2024-06-09 DIAGNOSIS — R21 Rash and other nonspecific skin eruption: Secondary | ICD-10-CM

## 2024-06-09 LAB — CBC WITH DIFFERENTIAL/PLATELET
Absolute Lymphocytes: 2549 {cells}/uL (ref 850–3900)
Absolute Monocytes: 447 {cells}/uL (ref 200–950)
Basophils Absolute: 69 {cells}/uL (ref 0–200)
Basophils Relative: 0.9 %
Eosinophils Absolute: 108 {cells}/uL (ref 15–500)
Eosinophils Relative: 1.4 %
HCT: 39 % (ref 35.0–45.0)
Hemoglobin: 12.7 g/dL (ref 11.7–15.5)
MCH: 28.7 pg (ref 27.0–33.0)
MCHC: 32.6 g/dL (ref 32.0–36.0)
MCV: 88.2 fL (ref 80.0–100.0)
MPV: 11.1 fL (ref 7.5–12.5)
Monocytes Relative: 5.8 %
Neutro Abs: 4528 {cells}/uL (ref 1500–7800)
Neutrophils Relative %: 58.8 %
Platelets: 289 Thousand/uL (ref 140–400)
RBC: 4.42 Million/uL (ref 3.80–5.10)
RDW: 13.7 % (ref 11.0–15.0)
Total Lymphocyte: 33.1 %
WBC: 7.7 Thousand/uL (ref 3.8–10.8)

## 2024-06-09 LAB — COMPREHENSIVE METABOLIC PANEL WITH GFR
AG Ratio: 1.9 (calc) (ref 1.0–2.5)
ALT: 8 U/L (ref 6–29)
AST: 13 U/L (ref 10–35)
Albumin: 4.9 g/dL (ref 3.6–5.1)
Alkaline phosphatase (APISO): 89 U/L (ref 31–125)
BUN: 9 mg/dL (ref 7–25)
CO2: 20 mmol/L (ref 20–32)
Calcium: 10.1 mg/dL (ref 8.6–10.2)
Chloride: 110 mmol/L (ref 98–110)
Creat: 0.65 mg/dL (ref 0.50–0.99)
Globulin: 2.6 g/dL (ref 1.9–3.7)
Glucose, Bld: 87 mg/dL (ref 65–99)
Potassium: 4.4 mmol/L (ref 3.5–5.3)
Sodium: 139 mmol/L (ref 135–146)
Total Bilirubin: 0.3 mg/dL (ref 0.2–1.2)
Total Protein: 7.5 g/dL (ref 6.1–8.1)
eGFR: 109 mL/min/1.73m2 (ref >=60)

## 2024-06-09 NOTE — Progress Notes (Signed)
 Office Visit Note  Patient: [place] Douglas             Date of Birth: [birth year]           MRN: [id number]             PCP: Georgina Speaks, FNP Referring: Georgina Speaks, FNP Visit Date: 06/09/2024 Occupation: Data Unavailable  Subjective:  Medication monitoring   History of Present Illness: [place] Douglas is a 48 y.o. female with history of seronegative rheumatoid arthritis.  Patient is currently taking sulfasalazine  500 mg 2 tablets by mouth twice [place] and plaquenil  200 mg 1 tablet by mouth twice [place].  Patient states that she has noticed about a 25% improvement in her symptoms since adding on sulfasalazine  as combination therapy.  She denies any side effects since starting sulfasalazine .  She continues to have chronic pain involving multiple joints.  She experiences discomfort in the shoulders, both hands, both knees, and both feet.  Patient states that she has difficulty performing her responsibilities at work at times due to severity of symptoms.  She requested to have her FMLA paperwork renewed.    Activities of [place] Living:  Patient reports morning stiffness for 2 hours to all day.   Patient Reports nocturnal pain.  Difficulty dressing/grooming: Reports Difficulty climbing stairs: Reports Difficulty getting out of chair: Reports Difficulty using hands for taps, buttons, cutlery, and/or writing: Reports  Review of Systems  Constitutional:  Positive for fatigue.  HENT:  Positive for mouth sores and mouth dryness. Negative for nose dryness.   Eyes:  Positive for dryness. Negative for pain and visual disturbance.  Respiratory:  Negative for cough, hemoptysis, shortness of breath and difficulty breathing.   Cardiovascular:  Negative for chest pain, palpitations, hypertension and swelling in legs/feet.  Gastrointestinal:  Positive for constipation and diarrhea. Negative for blood in stool.  Endocrine: Positive for increased urination.  Genitourinary:  Positive for  involuntary urination. Negative for painful urination.  Musculoskeletal:  Positive for joint pain, gait problem, joint pain, joint swelling, myalgias, muscle weakness, morning stiffness, muscle tenderness and myalgias.  Skin:  Positive for color change, rash, hair loss and sensitivity to sunlight. Negative for pallor, nodules/bumps, skin tightness and ulcers.  Allergic/Immunologic: Positive for susceptible to infections.  Neurological:  Positive for dizziness and headaches. Negative for numbness and weakness.  Hematological:  Positive for swollen glands.  Psychiatric/Behavioral:  Positive for depressed mood and sleep disturbance. The patient is nervous/anxious.     PMFS History:  Patient Active Problem List   Diagnosis Date Noted   Hot flashes 06/04/2024   Hair loss 06/04/2024   Need for influenza vaccination 05/20/2024   Other fatigue 01/17/2024   Abnormal weight loss 01/17/2024   Diverticulosis 01/17/2024   Rash and nonspecific skin eruption 01/17/2024   Influenza vaccination declined 11/02/2023   COVID-19 vaccine administered 11/02/2023   Need for pneumococcal vaccination 11/02/2023   Class 1 obesity due to excess calories with body mass index (BMI) of 30.0 to 30.9 in adult 11/02/2023   Fall 10/18/2023   Acute left ankle pain 10/18/2023   HFrEF (heart failure with reduced ejection fraction) (HCC) 11/09/2022   Type 2 diabetes mellitus with obesity 11/08/2022   DKA (diabetic ketoacidosis) (HCC) 10/13/2022   Cerebral venous thrombosis of sigmoid sinus 11/21/2021   Pulsatile tinnitus of right ear 11/21/2021   Pulsatile tinnitus, bilateral 06/27/2021   AVM (arteriovenous malformation) brain 05/31/2021   DDD (degenerative disc disease), cervical 12/06/2020   DDD (degenerative disc disease),  lumbar 12/06/2020   Gastroesophageal reflux disease with esophagitis without hemorrhage 12/06/2020   Increased intracranial pressure 12/06/2020   PTSD (post-traumatic stress disorder) 12/06/2020    Attention deficit hyperactivity disorder (ADHD), predominantly inattentive type 12/06/2020   Vitamin D  deficiency 12/06/2020   Obesity (BMI 30-39.9) 03/15/2020   Sciatic nerve pain 12/10/2019   Grief 06/05/2019   Migraine 06/05/2019   Depression 04/07/2019   Obesity due to excess calories without serious comorbidity 04/07/2019   Anxiety 04/07/2019   Benign hypertensive heart disease with congestive heart failure and with combined systolic and diastolic dysfunction (HCC) 12/08/2015   Type 2 diabetes mellitus with obesity 12/08/2015    Past Medical History:  Diagnosis Date   Acute pain of left shoulder 06/05/2019   ADHD (attention deficit hyperactivity disorder)    Anemia    iron deficiency   as a teenager   Anxiety    Arthritis    Chronic combined systolic and diastolic congestive heart failure (HCC) 11/23/2022   This is a new diagnosis with Cardiology, she is scheduled on 2/14 for her MRI of her heart.     Depression    Diabetes (HCC)    Diverticulitis 01/16/2024   Edema    GERD (gastroesophageal reflux disease)    History of congestive heart failure    dx by cardiology   Hypertension    patient denies- patient stated that the PCP said it was for heart prevention.   Left leg pain 04/07/2019   LLQ pain 12/08/2015   Migraine headache    Urinary urgency 09/16/2015    Family History  Problem Relation Age of Onset   Diabetes Mother    Hypertension Mother    Diabetes Father    Hypertension Father    Kidney failure Father    Anxiety disorder Father    Hypertension Brother    Cancer Maternal Grandmother    Diabetes Maternal Grandmother    Pancreatic cancer Maternal Grandmother    Arthritis Maternal Grandmother    Diabetes Paternal Grandmother    Breast cancer Paternal Grandmother    Stroke Paternal Grandfather    Healthy Son    ADD / ADHD Son    Healthy Son    Asthma Son    Cancer Maternal Aunt        breast   Breast cancer Maternal Aunt    Birth defects Maternal  Aunt    Past Surgical History:  Procedure Laterality Date   ABDOMINAL HYSTERECTOMY  2012   CHOLECYSTECTOMY     COLONOSCOPY  12/28/2020   polyps   ESOPHAGEAL DILATION     gamma knife     04/2022   IR ANGIO INTRA EXTRACRAN SEL COM CAROTID INNOMINATE BILAT MOD SED  05/31/2021   IR ANGIO INTRA EXTRACRAN SEL COM CAROTID INNOMINATE BILAT MOD SED  06/27/2021   IR ANGIO INTRA EXTRACRAN SEL COM CAROTID INNOMINATE UNI R MOD SED  11/21/2021   IR ANGIO VERTEBRAL SEL VERTEBRAL UNI R MOD SED  05/31/2021   IR CT HEAD LTD  06/27/2021   IR CT HEAD LTD  11/21/2021   IR INTRA CRAN STENT  06/27/2021   IR INTRA CRAN STENT  11/21/2021   IR RADIOLOGIST EVAL & MGMT  06/03/2021   IR RADIOLOGIST EVAL & MGMT  07/13/2021   IR RADIOLOGIST EVAL & MGMT  08/27/2021   IR RADIOLOGIST EVAL & MGMT  10/30/2021   IR RADIOLOGIST EVAL & MGMT  12/06/2021   IR US  GUIDE VASC ACCESS RIGHT  05/31/2021  IR US  GUIDE VASC ACCESS RIGHT  06/27/2021   IR US  GUIDE VASC ACCESS RIGHT  11/21/2021   OVARIAN CYST REMOVAL     RADIOLOGY WITH ANESTHESIA N/A 06/27/2021   Procedure: STENTING;  Surgeon: Dolphus Carrion, MD;  Location: [facility] OR;  Service: Radiology;  Laterality: N/A;   RADIOLOGY WITH ANESTHESIA N/A 11/21/2021   Procedure: IR WITH ANESTHESIA STENTING;  Surgeon: Dolphus Carrion, MD;  Location: [facility] OR;  Service: Radiology;  Laterality: N/A;   Social History   Tobacco Use   Smoking status: Former    Current packs/day: 0.50    Average packs/day: 0.5 packs/day for 26.2 years (13.1 ttl pk-yrs)    Types: Cigarettes    Start date: 03/24/2019    Passive exposure: Current   Smokeless tobacco: Never   Tobacco comments:    06/24/21- quit over a year ago  Vaping Use   Vaping status: Never Used  Substance Use Topics   Alcohol  use: No   Drug use: No   Social History   Social History Narrative   In relationship, 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 She lives with her 2 children in a one story house. She drinks one large cup of coffee a day and an occasional tea or soda. She walks [place].      One story home        Immunization History  Administered Date(s) Administered   HPV 9-valent 04/11/2019   Hepb-cpg 01/08/2024   Influenza Inj Mdck Quad Pf 05/18/2018   Influenza, Seasonal, Injecte, Preservative Fre 05/08/2024   Influenza,inj,Quad PF,6+ Mos 05/20/2017, 05/14/2019, 06/02/2020, 05/09/2022   Influenza-Unspecified 05/11/2017, 05/15/2018, 05/08/2019, 07/13/2021   Janssen (J&J) SARS-COV-2 Vaccination 11/23/2019   PFIZER(Purple Top)SARS-COV-2 Vaccination 07/11/2020   PNEUMOCOCCAL CONJUGATE-20 10/18/2023   Pfizer(Comirnaty)Fall Seasonal Vaccine 12 years and older 01/15/2023, 10/18/2023   Pneumococcal Polysaccharide-23 04/08/2019, 07/15/2019   Tdap 07/02/2017, 04/09/2019     Objective: Vital Signs: BP 120/81   Pulse 92   Temp 97.6 F (36.4 C)   Resp 12   Ht 5' 6 (1.676 m)   Wt 177 lb 3.2 oz (80.4 kg)   BMI 28.60 kg/m    Physical Exam Vitals and nursing note reviewed.  Constitutional:      Appearance: She is well-developed.  HENT:     Head: Normocephalic and atraumatic.  Eyes:     Conjunctiva/sclera: Conjunctivae normal.  Cardiovascular:     Rate and Rhythm: Normal rate and regular rhythm.     Heart sounds: Normal heart sounds.  Pulmonary:     Effort: Pulmonary effort is normal.     Breath sounds: Normal breath sounds.  Abdominal:     General: Bowel sounds are normal.     Palpations: Abdomen is soft.  Musculoskeletal:     Cervical back: Normal range of motion.  Lymphadenopathy:     Cervical: No cervical adenopathy.  Skin:    General: Skin is warm and dry.     Capillary Refill: Capillary refill takes less than 2 seconds.  Neurological:     Mental Status: She is alert and oriented to person, place, and time.  Psychiatric:        Behavior:  Behavior normal.      Musculoskeletal Exam: C-spine, thoracic spine, lumbar spine have good range of motion.  No midline spinal tenderness.  No SI  joint tenderness.  Shoulder joints, elbow joints, wrist joints, MCPs, PIPs, DIPs have good range of motion with no synovitis.  Complete fist formation bilaterally.  Hip joints have good range of motion with no groin pain.  Knee joints have good range of motion no warmth or effusion.  Ankle joints have good range of motion no tenderness or joint swelling.  No evidence of Achilles tendinitis or plantar fasciitis.   CDAI Exam: CDAI Score: -- Patient Global: --; Provider Global: -- Swollen: --; Tender: -- Joint Exam 06/09/2024   No joint exam has been documented for this visit   There is currently no information documented on the homunculus. Go to the Rheumatology activity and complete the homunculus joint exam.  Investigation: No additional findings.  Imaging: MM 3D SCREENING MAMMOGRAM BILATERAL BREAST Result Date: 05/29/2024 CLINICAL DATA:  Screening. EXAM: DIGITAL SCREENING BILATERAL MAMMOGRAM WITH TOMOSYNTHESIS AND CAD TECHNIQUE: Bilateral screening digital craniocaudal and mediolateral oblique mammograms were obtained. Bilateral screening digital breast tomosynthesis was performed. The images were evaluated with computer-aided detection. COMPARISON:  Previous exam(s). ACR Breast Density Category b: There are scattered areas of fibroglandular density. FINDINGS: There are no findings suspicious for malignancy. IMPRESSION: No mammographic evidence of malignancy. A result letter of this screening mammogram will be mailed directly to the patient. RECOMMENDATION: Screening mammogram in one year. (Code:SM-B-01Y) BI-RADS CATEGORY  1: Negative. Electronically Signed   By: Rosina Gelineau M.D.   On: 05/29/2024 13:46    Recent Labs: Lab Results  Component Value Date   WBC 7.8 03/17/2024   HGB 12.5 03/17/2024   PLT 255 03/17/2024   NA 142 03/17/2024    K 3.5 03/17/2024   CL 110 03/17/2024   CO2 19 (L) 03/17/2024   GLUCOSE 88 03/17/2024   BUN 7 03/17/2024   CREATININE 0.62 03/17/2024   BILITOT 0.5 03/17/2024   ALKPHOS 96 03/17/2024   AST 22 03/17/2024   ALT 14 03/17/2024   PROT 7.8 03/17/2024   ALBUMIN 4.3 03/17/2024   CALCIUM  9.8 03/17/2024   GFRAA 89 07/15/2020   QFTBGOLDPLUS NEGATIVE 12/14/2023    Speciality Comments: PLQ Eye Exam:  01/02/2024 WNL @ [facility] Follow up in 1 year   Procedures:  No procedures performed Allergies: Patient has no known allergies.   Assessment / Plan:     Visit Diagnoses: Seronegative rheumatoid arthritis (HCC) - 10/03/23: RF-, Anti-CCP-, 14-3-3 eta-, ESR WNL, CRP WNL.  U/s 09/3023: positive for synovitis: Patient presents today with ongoing arthralgias and joint stiffness involving multiple joints.  She has noticed about a 25% improvement in her symptoms since adding on sulfasalazine  as combination therapy.  She has been taking sulfasalazine  500 mg 2 tablets by mouth twice [place] and Plaquenil  200 mg 1 tablet by mouth twice [place].  She is tolerating combination therapy without any side effects and has not had any gaps in therapy.  She has had less severe flares but continues to have some difficulty typing for prolonged peers of time for work due to the discomfort in her hands.  Suggested the use of arthritis compression gloves which she can use as needed.  She will remain on sulfasalazine  and Plaquenil  as combination therapy and is willing to give the combination more time.  She will follow-up in the office in 3 months or sooner if needed.  High risk medication use - Sulfasalazine  500 mg 2 tablets by mouth twice [place] and plaquenil  200 mg 1 tablet by mouth twice [place]. Previous tx; methotrexate -rash.  CBC and CMP  updated on 03/17/24. Orders for CBC and CMP released today.  No recent or recurrent infections. Discussed the importance of holding sulfasalazine  if she develops signs or symptoms of an  infection and to resume once the infection has completely cleared.   PLQ Eye Exam: 01/02/2024 WNL @ [facility] Follow up in 1 year   - Plan: CBC with Differential/Platelet, Comprehensive metabolic panel with GFR  Positive ANA (antinuclear antibody) - ANA 1:80 cytoplasmic, speckled on 03/27/22.  Repeat ANA on 07/12/22 negative.  ANCA negative on 11/06/2023.  Primary osteoarthritis of both hands: She continues to have ongoing pain and stiffness involving both hands.  She has difficulty typing for prolonged peers of time at work due to severity of symptoms.  Patient requested to have FMLA paperwork resubmitted. Suggested the use of arthritis compression gloves.  She will remain on Plaquenil  and sulfasalazine  as prescribed.  Raynaud's disease without gangrene: She has intermittent symptoms of Raynaud's phenomenon.  No signs of sclerodactyly or digital ulcerations noted.  Chronic pain of both knees: She continues to have chronic pain involving both knees.  No warmth or effusion noted today.  DDD (degenerative disc disease), cervical: Limited range of motion with lateral rotation.  Myofascial pain: She continues to have generalized hyperalgesia and positive tender points on exam.  Degeneration of intervertebral disc of lumbar region without discogenic back pain or lower extremity pain: Chronic pain.  Pain in both feet: Patient experiences intermittent discomfort in both feet.  No synovitis noted today.  Facial rash: No facial rash noted today.  Photosensitivity  Other medical conditions are listed as follows:   Essential hypertension: BP was 120/81 today in the office.  Type 2 diabetes mellitus without complication, without long-term current use of insulin  (HCC)  AVM (arteriovenous malformation) brain  Increased intracranial pressure  Hx of migraine headaches  Attention deficit hyperactivity disorder (ADHD), predominantly inattentive type  Gastroesophageal reflux disease with  esophagitis without hemorrhage  PTSD (post-traumatic stress disorder)  Anxiety and depression  History of chronic CHF  Former smoker  Orders: Orders Placed This Encounter  Procedures   CBC with Differential/Platelet   Comprehensive metabolic panel with GFR   No orders of the defined types were placed in this encounter.    Follow-Up Instructions: Return in 3 months (on 09/09/2024) for Rheumatoid arthritis.   Waddell CHRISTELLA Craze, PA-C  Note - This record has been created using Dragon software.  Chart creation errors have been sought, but may not always  have been located. Such creation errors do not reflect on  the standard of medical care.

## 2024-06-09 NOTE — Telephone Encounter (Signed)
 Document has been printed and is being worked on. Will place in Dr. Jammie folder to sign.

## 2024-06-10 ENCOUNTER — Ambulatory Visit: Payer: Self-pay | Admitting: Physician Assistant

## 2024-06-10 ENCOUNTER — Telehealth: Payer: Self-pay

## 2024-06-10 NOTE — Progress Notes (Signed)
 CBC and CMP WNL

## 2024-06-10 NOTE — Telephone Encounter (Signed)
 Patient requested we fill out her FMLA paperwork for [employer] again. Paperwork has been filled out and faxed. Patient requested to have a copy ready for pickup. A copy will be placed up front for pickup and in scan place.

## 2024-06-11 ENCOUNTER — Ambulatory Visit: Payer: Self-pay

## 2024-06-11 ENCOUNTER — Other Ambulatory Visit: Payer: Self-pay | Admitting: Nurse Practitioner

## 2024-06-13 ENCOUNTER — Other Ambulatory Visit: Payer: Self-pay | Admitting: Nurse Practitioner

## 2024-06-29 NOTE — Progress Notes (Unsigned)
 Cardiology Office Note:    Date:  06/30/2024   ID:  Virginia Douglas, DOB 17-Jul-1976, MRN [id number]  PCP:  Georgina Speaks, FNP  Cardiologist:  Lonni LITTIE Nanas, MD  Electrophysiologist:  None   Referring MD: Georgina Speaks, FNP   Chief Complaint  Patient presents with   Congestive Heart Failure    History of Present Illness:    Virginia Douglas is a 48 y.o. female with a hx of recently diagnosed combined systolic and diastolic heart failure, GERD, hypertension, diabetes who presents for follow-up.  She was referred by [name], NP for evaluation of abnormal EKG, initially seen on 08/07/2022.  She reports having chest pain which occurs about once per week.  Describes as sharp pain in center of her chest, lasts for 30 seconds or so.  She does not exercise but reports she is short of breath with minimal exertion, such as walking up stairs.  She reports having lightheadedness but denies any recent syncope.  Does report lower extremity swelling.  Reports palpitations where feels like heart is racing, occurs about once every 2 weeks.  She smoked 1 pack/day x 16 years, quit age 57.  No drug use.  Alcohol  on special occasions.  Family history includes father had CVA age 51.  Echocardiogram 09/01/2022 showed EF 30 to 35%, normal RV function, no significant valvular disease.  Coronary CTA on 08/25/2022 showed normal coronary arteries.  Cardiac MRI 10/18/2022 showed LVEF 44%, RVEF 52%, no LGE.  Zio patch x 13 days 09/2022 showed no significant arrhythmias.  Echocardiogram 06/18/2023 showed EF 45%, normal RV function, mild MR/AI.  Since last clinic visit, she reports she is doing okay.  She had ED visit in July for chest pain, work up unremarkable.  No chest pain since.  She denies any shortness of breath.  Reports rare lightheadedness but denies any syncope.  Does report intermittent lower extremity swelling.  Reports rare palpitations.  She walks her dog for exercise.  She has lost 50  pounds over the last 2 years.  She has been off Entresto  and Jardiance  for months because was having itching.  She stopped both medications and itching resolved.   Wt Readings from Last 3 Encounters:  06/30/24 176 lb (79.8 kg)  06/09/24 177 lb 3.2 oz (80.4 kg)  06/05/24 177 lb (80.3 kg)   BP Readings from Last 3 Encounters:  06/30/24 (!) 100/58  06/09/24 120/81  06/05/24 133/83     Past Medical History:  Diagnosis Date   Acute pain of left shoulder 06/05/2019   ADHD (attention deficit hyperactivity disorder)    Anemia    iron deficiency   as a teenager   Anxiety    Arthritis    Chronic combined systolic and diastolic congestive heart failure (HCC) 11/23/2022   This is a new diagnosis with Cardiology, she is scheduled on 2/14 for her MRI of her heart.     Depression    Diabetes (HCC)    Diverticulitis 01/16/2024   Edema    GERD (gastroesophageal reflux disease)    History of congestive heart failure    dx by cardiology   Hypertension    patient denies- patient stated that the PCP said it was for heart prevention.   Left leg pain 04/07/2019   LLQ pain 12/08/2015   Migraine headache    Urinary urgency 09/16/2015    Past Surgical History:  Procedure Laterality Date   ABDOMINAL HYSTERECTOMY  2012   CHOLECYSTECTOMY     COLONOSCOPY  12/28/2020   polyps   ESOPHAGEAL DILATION     gamma knife     04/2022   IR ANGIO INTRA EXTRACRAN SEL COM CAROTID INNOMINATE BILAT MOD SED  05/31/2021   IR ANGIO INTRA EXTRACRAN SEL COM CAROTID INNOMINATE BILAT MOD SED  06/27/2021   IR ANGIO INTRA EXTRACRAN SEL COM CAROTID INNOMINATE UNI R MOD SED  11/21/2021   IR ANGIO VERTEBRAL SEL VERTEBRAL UNI R MOD SED  05/31/2021   IR CT HEAD LTD  06/27/2021   IR CT HEAD LTD  11/21/2021   IR INTRA CRAN STENT  06/27/2021   IR INTRA CRAN STENT  11/21/2021   IR RADIOLOGIST EVAL & MGMT  06/03/2021   IR RADIOLOGIST EVAL & MGMT  07/13/2021   IR RADIOLOGIST EVAL & MGMT  08/27/2021   IR RADIOLOGIST EVAL &  MGMT  10/30/2021   IR RADIOLOGIST EVAL & MGMT  12/06/2021   IR US  GUIDE VASC ACCESS RIGHT  05/31/2021   IR US  GUIDE VASC ACCESS RIGHT  06/27/2021   IR US  GUIDE VASC ACCESS RIGHT  11/21/2021   OVARIAN CYST REMOVAL     RADIOLOGY WITH ANESTHESIA N/A 06/27/2021   Procedure: STENTING;  Surgeon: Dolphus Carrion, MD;  Location: [facility] OR;  Service: Radiology;  Laterality: N/A;   RADIOLOGY WITH ANESTHESIA N/A 11/21/2021   Procedure: IR WITH ANESTHESIA STENTING;  Surgeon: Dolphus Carrion, MD;  Location: [facility] OR;  Service: Radiology;  Laterality: N/A;    Current Medications: Current Meds  Medication Sig   aspirin  EC 81 MG tablet Take 81 mg by mouth daily. Swallow whole.   atorvastatin  (LIPITOR) 10 MG tablet Take 1 tablet (10 mg total) by mouth daily.   Blood Pressure Monitoring Soln KIT    buPROPion  (WELLBUTRIN  XL) 150 MG 24 hr tablet Take 1 tablet (150 mg total) by mouth daily.   carvedilol  (COREG ) 3.125 MG tablet Take 1 tablet (3.125 mg total) by mouth 2 (two) times daily.   cetirizine (ZYRTEC) 10 MG tablet Take 10 mg by mouth daily as needed for allergies.   clonazePAM  (KLONOPIN ) 0.5 MG tablet Take 1 tablet (0.5 mg total) by mouth daily as needed.   DULoxetine  (CYMBALTA ) 60 MG capsule TAKE 1 CAPSULE BY MOUTH EVERY DAY   empagliflozin  (JARDIANCE ) 10 MG TABS tablet Take 1 tablet by mouth once daily in the morning   eszopiclone  (LUNESTA ) 2 MG TABS tablet Take 1 tablet (2 mg total) by mouth at bedtime. Take immediately before bedtime   famotidine  (PEPCID ) 20 MG tablet Take 1 tablet (20 mg total) by mouth 2 (two) times daily for 14 days. (Patient taking differently: Take 20 mg by mouth as needed.)   fluconazole  (DIFLUCAN ) 100 MG tablet Take 1 tablet (100 mg total) by mouth daily. Take 1 tablet by mouth now repeat in 5 days   folic acid  (FOLVITE ) 1 MG tablet Take 2 tablets (2 mg total) by mouth daily.   Galcanezumab -gnlm (EMGALITY ) 120 MG/ML SOAJ Inject 120 mg into the skin every 30 (thirty) days.    hydroxychloroquine  (PLAQUENIL ) 200 MG tablet Take 1 tablet (200 mg total) by mouth 2 (two) times daily.   insulin  aspart (FIASP  FLEXTOUCH) 100 UNIT/ML FlexTouch Pen INJECT [place] INSULIN  BEFORE MEALS AND AT HS IF BS < 150 - 0 UNITS, 150-199 - 3 UNITS, 200-249 - 6 UNITS, 250-299 - 9 UNITS, 300-349 - 12 UNITS, >350 - 15 UNITS. IF >400 CALL OUR OFFICE WITH YOUR BLOOD SUGAR READINGS. (Patient taking differently: as needed. Inject [place] insulin  before  meals and at hs if BS < 150 - 0 units, 150-199 - 3 units, 200-249 - 6 units, 250-299 - 9 units, 300-349 - 12 units, >350 - 15 units. If >400 call our office with your blood sugar readings.)   Insulin  Pen Needle (PEN NEEDLES) 32G X 4 MM MISC 1 each by Does not apply route once a week.   Lancets (ONETOUCH DELICA PLUS LANCET33G) MISC Apply 1 each topically 3 (three) times daily.   LINZESS  72 MCG capsule TAKE 1 CAPSULE BY MOUTH DAILY BEFORE BREAKFAST.   ondansetron  (ZOFRAN ) 4 MG tablet Take 1 tablet (4 mg total) by mouth every 8 (eight) hours as needed.   Polyethyl Glycol-Propyl Glycol (SYSTANE) 0.4-0.3 % SOLN Place 1 drop into both eyes daily as needed (dry eyes).   spironolactone  (ALDACTONE ) 25 MG tablet Take 0.5 tablets (12.5 mg total) by mouth daily.   sulfaSALAzine  (AZULFIDINE ) 500 MG tablet Take 2 tablets (1,000 mg total) by mouth 2 (two) times daily.   tirzepatide  (MOUNJARO ) 10 MG/0.5ML Pen INJECT 10 MG INTO THE SKIN ONE TIME PER WEEK   topiramate  (TOPAMAX ) 100 MG tablet TAKE 2 TABLETS BY MOUTH AT BEDTIME.   Ubrogepant  (UBRELVY ) 100 MG TABS Take 1 tablet (100 mg total) by mouth as needed. May repeat after 2 hours.  Maximum 2 tablets.   Vitamin D , Ergocalciferol , (DRISDOL ) 1.25 MG (50000 UNIT) CAPS capsule TAKE 1 CAPSULE BY MOUTH EVERY 7 DAYS **NOT COVERED**   [DISCONTINUED] clopidogrel  (PLAVIX ) 75 MG tablet Take 1 tablet (75 mg total) by mouth daily.   [DISCONTINUED] sacubitril-valsartan (ENTRESTO ) 24-26 MG Take 1 tablet by mouth 2 (two) times daily.      Allergies:   Patient has no known allergies.   Social History   Socioeconomic History   Marital status: Legally Separated    Spouse name: Lynwood   Number of children: 2   Years of education: 16   Highest education level: Associate degree: academic program  Occupational History   Occupation: Geophysical Data Processor: [employer]  Tobacco Use   Smoking status: Former    Current packs/day: 0.50    Average packs/day: 0.5 packs/day for 26.3 years (13.1 ttl pk-yrs)    Types: Cigarettes    Start date: 03/24/2019    Passive exposure: Current   Smokeless tobacco: Never   Tobacco comments:    06/24/21- quit over a year ago  Vaping Use   Vaping status: Never Used  Substance and Sexual Activity   Alcohol  use: No   Drug use: No   Sexual activity: Yes    Birth control/protection: Surgical  Other Topics Concern   Not on file  Social History Narrative   In relationship, midwife in set designer facility, does a lot of walking and standing on the job, walks for exercise   Caffeine  use: Drinks tea (3 glasses per week)      Patient is right handed. She lives with her 2 children in a one story house. She drinks one large cup of coffee a day and an occasional tea or soda. She walks daily.      One story home      Social Drivers of Health   Financial Resource Strain: Low Risk  (05/07/2024)   Overall Financial Resource Strain (CARDIA)    Difficulty of Paying Living Expenses: Not very hard  Food Insecurity: No Food Insecurity (05/07/2024)   Hunger Vital Sign    Worried About Running Out of Food in the Last Year: Never true  Ran Out of Food in the Last Year: Never true  Transportation Needs: No Transportation Needs (05/07/2024)   PRAPARE - Administrator, Civil Service (Medical): No    Lack of Transportation (Non-Medical): No  Physical Activity: Inactive (05/07/2024)   Exercise Vital Sign    Days of Exercise per Week: 0 days    Minutes of Exercise per  Session: Not on file  Stress: Stress Concern Present (05/07/2024)   [employer] of Occupational Health - Occupational Stress Questionnaire    Feeling of Stress: To some extent  Social Connections: Unknown (05/07/2024)   Social Connection and Isolation Panel    Frequency of Communication with Friends and Family: More than three times a week    Frequency of Social Gatherings with Friends and Family: Once a week    Attends Religious Services: More than 4 times per year    Active Member of [employer] or Organizations: Yes    Attends Engineer, Structural: More than 4 times per year    Marital Status: Patient declined     Family History: The patient's family history includes ADD / ADHD in her son; Anxiety disorder in her father; Arthritis in her maternal grandmother; Asthma in her son; Birth defects in her maternal aunt; Breast cancer in her maternal aunt and paternal grandmother; Cancer in her maternal aunt and maternal grandmother; Diabetes in her father, maternal grandmother, mother, and paternal grandmother; Healthy in her son and son; Hypertension in her brother, father, and mother; Kidney failure in her father; Pancreatic cancer in her maternal grandmother; Stroke in her paternal grandfather.  ROS:   Please see the history of present illness.     All other systems reviewed and are negative.  EKGs/Labs/Other Studies Reviewed:    The following studies were reviewed today:   EKG:   07/25/2022: Normal sinus rhythm, rate 89, LVH 07/03/23: NSR, rate 76, LVH  Recent Labs: 03/17/2024: B Natriuretic Peptide 8.0; Magnesium 2.0 06/04/2024: TSH 1.510 06/09/2024: ALT 8; BUN 9; Creat 0.65; Hemoglobin 12.7; Platelets 289; Potassium 4.4; Sodium 139  Recent Lipid Panel    Component Value Date/Time   CHOL 196 05/08/2024 0914   TRIG 76 05/08/2024 0914   HDL 55 05/08/2024 0914   CHOLHDL 3.6 05/08/2024 0914   LDLCALC 127 (H) 05/08/2024 0914    Physical Exam:    VS:  BP (!) 100/58 (BP  Location: Left Arm, Patient Position: Sitting, Cuff Size: Normal)   Pulse 98   Ht 5' 6 (1.676 m)   Wt 176 lb (79.8 kg)   SpO2 98%   BMI 28.41 kg/m     Wt Readings from Last 3 Encounters:  06/30/24 176 lb (79.8 kg)  06/09/24 177 lb 3.2 oz (80.4 kg)  06/05/24 177 lb (80.3 kg)     GEN:  Well nourished, well developed in no acute distress HEENT: Normal NECK: No JVD; No carotid bruits LYMPHATICS: No lymphadenopathy CARDIAC: RRR, no murmurs, rubs, gallops RESPIRATORY:  Clear to auscultation without rales, wheezing or rhonchi  ABDOMEN: Soft, non-tender, non-distended MUSCULOSKELETAL:  No edema; No deformity  SKIN: Warm and dry NEUROLOGIC:  Alert and oriented x 3 PSYCHIATRIC:  Normal affect   ASSESSMENT:    1. Chronic combined systolic (congestive) and diastolic (congestive) heart failure (HCC)   2. Essential hypertension   3. Hyperlipidemia, unspecified hyperlipidemia type   4. Palpitations      PLAN:    Chronic combined heart failure: Echocardiogram 09/01/2022 showed EF 30 to 35%, normal RV function,  no significant valvular disease.  Coronary CTA on 08/25/2022 showed normal coronary arteries.  Cardiac MRI 10/18/2022 showed LVEF 44%, RVEF 52%, no LGE. Appears euvolemic.  Echocardiogram 06/18/2023 showed EF 45%, normal RV function, mild MR/AI.   -Appears euvolemic on exam -Continue Coreg  3.125 mg twice daily -Continue spironolactone  12.5 mg daily.  -She reports she stopped both Entresto  and Jardiance  due to itching and itching resolved.  Unclear which medication was culprit.  Will restart Jardiance  10 mg daily and asked patient to let us  know if itching recurs. -Update echocardiogram  Palpitations: Zio patch x 13 days 09/2022 showed no significant arrhythmias.  Hypertension: On Coreg  3.125 mg twice daily, spironolactone  12.5 mg daily. Appears controlled.    Hyperlipidemia: On atorvastatin  10 mg daily.    T2DM: Admission 10/2022 with DKA.  Started on insulin .  A1c 11.4%  10/13/2022.  Referred to endocrinology.  A1c has improved significantly, most recently 5.5% on 05/08/2024  Left sigmoid sinus stenosis: Status post stenting 06/2021.  She is on aspirin  and Plavix .  Okay to discontinue Plavix  given years since stenting, will continue on aspirin  81 mg daily  Left superior cerebellar AVM: Underwent gamma knife radiosurgery at [facility]  Idiopathic intracranial hypertension: Was on Diamox  250 mg twice daily, but now off.  Follows with neurology  Snoring/daytime somnolence: No OSA on sleep study 03/2023  Leg pain: Normal ABIs 11/2022  Obesity: has lost 50 lbs since 2023.  Congratulated patient on weight loss  RTC in 3 months   Medication Adjustments/Labs and Tests Ordered: Current medicines are reviewed at length with the patient today.  Concerns regarding medicines are outlined above.  Orders Placed This Encounter  Procedures   Basic Metabolic Panel (BMET)   ECHOCARDIOGRAM COMPLETE   No orders of the defined types were placed in this encounter.   Patient Instructions  Medication Instructions:  Restart Jardiance  as directed by your provider Stop Plavix  as directed by your provider *If you need a refill on your cardiac medications before your next appointment, please call your pharmacy*  Lab Work: Bmet in one week If you have labs (blood work) drawn today and your tests are completely normal, you will receive your results only by: MyChart Message (if you have MyChart) OR A paper copy in the mail If you have any lab test that is abnormal or we need to change your treatment, we will call you to review the results.  Testing/Procedures: Echo  Your physician has requested that you have an echocardiogram. Echocardiography is a painless test that uses sound waves to create images of your heart. It provides your doctor with information about the size and shape of your heart and how well your heart's chambers and valves are working. This procedure takes approximately  one hour. There are no restrictions for this procedure. Please do NOT wear cologne, perfume, aftershave, or lotions (deodorant is allowed). Please arrive 15 minutes prior to your appointment time.  Please note: We ask at that you not bring children with you during ultrasound (echo/ vascular) testing. Due to room size and safety concerns, children are not allowed in the ultrasound rooms during exams. Our front office staff cannot provide observation of children in our lobby area while testing is being conducted. An adult accompanying a patient to their appointment will only be allowed in the ultrasound room at the discretion of the ultrasound technician under special circumstances. We apologize for any inconvenience.   Follow-Up: At [facility], you and your health needs are our priority.  As part of our continuing mission to provide you with exceptional heart care, our providers are all part of one team.  This team includes your primary Cardiologist (physician) and Advanced Practice Providers or APPs (Physician Assistants and Nurse Practitioners) who all work together to provide you with the care you need, when you need it.  Your next appointment:   3 months  Provider:   Lonni LITTIE Nanas, MD    We recommend signing up for the patient portal called MyChart.  Sign up information is provided on this After Visit Summary.  MyChart is used to connect with patients for Virtual Visits (Telemedicine).  Patients are able to view lab/test results, encounter notes, upcoming appointments, etc.  Non-urgent messages can be sent to your provider as well.   To learn more about what you can do with MyChart, go to [email or web address].   Other Instructions Please once restart Jardaince send a message let provider know if itching reoccurs           Signed, Lonni LITTIE Nanas, MD  06/30/2024 8:51 AM    [facility]

## 2024-06-30 ENCOUNTER — Encounter: Payer: Self-pay | Admitting: Cardiology

## 2024-06-30 ENCOUNTER — Ambulatory Visit: Attending: Cardiology | Admitting: Cardiology

## 2024-06-30 VITALS — BP 100/58 | HR 98 | Ht 66.0 in | Wt 176.0 lb

## 2024-06-30 DIAGNOSIS — E785 Hyperlipidemia, unspecified: Secondary | ICD-10-CM | POA: Diagnosis not present

## 2024-06-30 DIAGNOSIS — I10 Essential (primary) hypertension: Secondary | ICD-10-CM

## 2024-06-30 DIAGNOSIS — R002 Palpitations: Secondary | ICD-10-CM

## 2024-06-30 DIAGNOSIS — I5042 Chronic combined systolic (congestive) and diastolic (congestive) heart failure: Secondary | ICD-10-CM | POA: Diagnosis not present

## 2024-06-30 NOTE — Patient Instructions (Addendum)
 Medication Instructions:  Restart Jardiance  as directed by your provider Stop Plavix  as directed by your provider *If you need a refill on your cardiac medications before your next appointment, please call your pharmacy*  Lab Work: Bmet in one week If you have labs (blood work) drawn today and your tests are completely normal, you will receive your results only by: MyChart Message (if you have MyChart) OR A paper copy in the mail If you have any lab test that is abnormal or we need to change your treatment, we will call you to review the results.  Testing/Procedures: Echo  Your physician has requested that you have an echocardiogram. Echocardiography is a painless test that uses sound waves to create images of your heart. It provides your doctor with information about the size and shape of your heart and how well your heart's chambers and valves are working. This procedure takes approximately one hour. There are no restrictions for this procedure. Please do NOT wear cologne, perfume, aftershave, or lotions (deodorant is allowed). Please arrive 15 minutes prior to your appointment time.  Please note: We ask at that you not bring children with you during ultrasound (echo/ vascular) testing. Due to room size and safety concerns, children are not allowed in the ultrasound rooms during exams. Our front office staff cannot provide observation of children in our lobby area while testing is being conducted. An adult accompanying a patient to their appointment will only be allowed in the ultrasound room at the discretion of the ultrasound technician under special circumstances. We apologize for any inconvenience.   Follow-Up: At [facility], you and your health needs are our priority.  As part of our continuing mission to provide you with exceptional heart care, our providers are all part of one team.  This team includes your primary Cardiologist (physician) and Advanced Practice Providers or  APPs (Physician Assistants and Nurse Practitioners) who all work together to provide you with the care you need, when you need it.  Your next appointment:   3 months  Provider:   Lonni LITTIE Nanas, MD    We recommend signing up for the patient portal called MyChart.  Sign up information is provided on this After Visit Summary.  MyChart is used to connect with patients for Virtual Visits (Telemedicine).  Patients are able to view lab/test results, encounter notes, upcoming appointments, etc.  Non-urgent messages can be sent to your provider as well.   To learn more about what you can do with MyChart, go to [email or web address].   Other Instructions Please once restart Jardaince send a message let provider know if itching reoccurs

## 2024-07-08 ENCOUNTER — Encounter: Payer: Self-pay | Admitting: Cardiology

## 2024-07-08 NOTE — Telephone Encounter (Signed)
 Recommend stopping jardiance .

## 2024-07-28 ENCOUNTER — Ambulatory Visit (HOSPITAL_COMMUNITY)
Admission: RE | Admit: 2024-07-28 | Discharge: 2024-07-28 | Disposition: A | Discharge status: Home / Self Care | Source: Ambulatory Visit | Attending: Cardiovascular Disease | Admitting: Cardiovascular Disease

## 2024-07-28 ENCOUNTER — Encounter: Payer: Self-pay | Admitting: Neurology

## 2024-07-28 DIAGNOSIS — I5042 Chronic combined systolic (congestive) and diastolic (congestive) heart failure: Secondary | ICD-10-CM | POA: Diagnosis not present

## 2024-07-28 LAB — BASIC METABOLIC PANEL WITH GFR
BUN/Creatinine Ratio: 11 (ref 9–23)
BUN: 8 mg/dL (ref 6–24)
CO2: 18 mmol/L — ABNORMAL LOW (ref 20–29)
Calcium: 9.9 mg/dL (ref 8.7–10.2)
Chloride: 110 mmol/L — ABNORMAL HIGH (ref 96–106)
Creatinine, Ser: 0.7 mg/dL (ref 0.57–1.00)
Glucose: 72 mg/dL (ref 70–99)
Potassium: 4.2 mmol/L (ref 3.5–5.2)
Sodium: 143 mmol/L (ref 134–144)
eGFR: 107 mL/min/1.73 (ref >59)

## 2024-07-28 LAB — ECHOCARDIOGRAM COMPLETE
Area-P 1/2: 5.27 cm2
P 1/2 time: 329 ms
S' Lateral: 4.1 cm

## 2024-07-29 ENCOUNTER — Encounter: Payer: Self-pay | Admitting: Cardiology

## 2024-07-29 ENCOUNTER — Ambulatory Visit: Payer: Self-pay | Admitting: Cardiology

## 2024-07-30 ENCOUNTER — Other Ambulatory Visit: Payer: Self-pay | Admitting: Cardiology

## 2024-07-30 DIAGNOSIS — I5042 Chronic combined systolic (congestive) and diastolic (congestive) heart failure: Secondary | ICD-10-CM

## 2024-07-30 MED ORDER — LOSARTAN POTASSIUM 25 MG PO TABS: 25.0000 mg | ORAL_TABLET | Freq: Every day | ORAL | 3 refills | Status: AC

## 2024-07-30 NOTE — Telephone Encounter (Signed)
 Spoke with patient, discussed echo results

## 2024-08-13 ENCOUNTER — Ambulatory Visit: Admitting: Neurology

## 2024-08-14 DIAGNOSIS — Z0279 Encounter for issue of other medical certificate: Secondary | ICD-10-CM

## 2024-08-14 NOTE — Telephone Encounter (Signed)
 Pt called in to make payment for FMLA. Collected.

## 2024-08-25 ENCOUNTER — Other Ambulatory Visit: Payer: Self-pay

## 2024-08-25 DIAGNOSIS — I5042 Chronic combined systolic (congestive) and diastolic (congestive) heart failure: Secondary | ICD-10-CM

## 2024-08-26 MED ORDER — CARVEDILOL 3.125 MG PO TABS: 3.1250 mg | ORAL_TABLET | Freq: Two times a day (BID) | ORAL | 3 refills | Status: AC

## 2024-08-26 NOTE — Telephone Encounter (Signed)
 ERROR

## 2024-08-28 NOTE — Progress Notes (Signed)
 "  Office Visit Note  Patient: Virginia Douglas             Date of Birth: 04/25/1976           MRN: [id number]             PCP: Georgina Speaks, FNP Referring: Georgina Speaks, FNP Visit Date: 09/11/2024 Occupation: Data Unavailable  Subjective:  Total body pain   History of Present Illness: Virginia Douglas Name is a 48 y.o. female seronegative rheumatoid arthritis and osteoarthritis.  Patient remains on Sulfasalazine  500 mg 2 tablets by mouth twice daily and plaquenil  200 mg 1 tablet by mouth twice daily.  She is tolerating combination therapy.  Patient reports that she has continued to have vaginal yeast infections off-and-on and has been using over-the-counter products.  She has not yet followed up with gynecology and has not tried a course of Diflucan .  She is not currently symptomatic.  Patient states that when the symptoms of yeast infection recur she hold sulfasalazine  until the symptoms have resolved. Patient continues to experience total body pain.  Her morning stiffness has been lasting several hours.  She is also been having more frequent falls.  She does not feel safe living alone due to the frequency of falls.  Her kids come to her home throughout the week to check on her but she is considering moving in with her son.  Patient is under the care of Dr. Skeet for migraine management.     Activities of Daily Living:  Patient reports morning stiffness for a couple hours.   Patient Reports nocturnal pain.  Difficulty dressing/grooming: Reports Difficulty climbing stairs: Reports Difficulty getting out of chair: Reports Difficulty using hands for taps, buttons, cutlery, and/or writing: Reports  Review of Systems  Constitutional:  Positive for fatigue.  HENT:  Positive for mouth sores and mouth dryness.   Eyes:  Positive for dryness.  Respiratory:  Positive for shortness of breath.   Cardiovascular:  Positive for chest pain and palpitations.  Gastrointestinal:  Positive for  constipation and diarrhea. Negative for blood in stool.  Endocrine: Positive for increased urination.  Genitourinary:  Positive for involuntary urination.  Musculoskeletal:  Positive for joint pain, gait problem, joint pain, joint swelling, myalgias, muscle weakness, morning stiffness, muscle tenderness and myalgias.  Skin:  Positive for color change, rash, hair loss and sensitivity to sunlight.  Allergic/Immunologic: Positive for susceptible to infections.  Neurological:  Positive for dizziness and headaches.  Hematological:  Positive for swollen glands.  Psychiatric/Behavioral:  Positive for depressed mood and sleep disturbance. The patient is nervous/anxious.     PMFS History:  Patient Active Problem List   Diagnosis Date Noted   Hot flashes 06/04/2024   Hair loss 06/04/2024   Need for influenza vaccination 05/20/2024   Other fatigue 01/17/2024   Abnormal weight loss 01/17/2024   Diverticulosis 01/17/2024   Rash and nonspecific skin eruption 01/17/2024   Influenza vaccination declined 11/02/2023   COVID-19 vaccine administered 11/02/2023   Need for pneumococcal vaccination 11/02/2023   Class 1 obesity due to excess calories with body mass index (BMI) of 30.0 to 30.9 in adult 11/02/2023   Fall 10/18/2023   Acute left ankle pain 10/18/2023   HFrEF (heart failure with reduced ejection fraction) (HCC) 11/09/2022   Type 2 diabetes mellitus with obesity 11/08/2022   DKA (diabetic ketoacidosis) (HCC) 10/13/2022   Cerebral venous thrombosis of sigmoid sinus 11/21/2021   Pulsatile tinnitus of right ear 11/21/2021   Pulsatile tinnitus, bilateral  06/27/2021   AVM (arteriovenous malformation) brain 05/31/2021   DDD (degenerative disc disease), cervical 12/06/2020   DDD (degenerative disc disease), lumbar 12/06/2020   Gastroesophageal reflux disease with esophagitis without hemorrhage 12/06/2020   Increased intracranial pressure 12/06/2020   PTSD (post-traumatic stress disorder)  12/06/2020   Attention deficit hyperactivity disorder (ADHD), predominantly inattentive type 12/06/2020   Vitamin D  deficiency 12/06/2020   Obesity (BMI 30-39.9) 03/15/2020   Sciatic nerve pain 12/10/2019   Grief 06/05/2019   Migraine 06/05/2019   Depression 04/07/2019   Obesity due to excess calories without serious comorbidity 04/07/2019   Anxiety 04/07/2019   Benign hypertensive heart disease with congestive heart failure and with combined systolic and diastolic dysfunction (HCC) 12/08/2015   Type 2 diabetes mellitus with obesity 12/08/2015    Past Medical History:  Diagnosis Date   Acute pain of left shoulder 06/05/2019   ADHD (attention deficit hyperactivity disorder)    Anemia    iron deficiency   as a teenager   Anxiety    Arthritis    Chronic combined systolic and diastolic congestive heart failure (HCC) 11/23/2022   This is a new diagnosis with Cardiology, she is scheduled on 2/14 for her MRI of her heart.     Depression    Diabetes (HCC)    Diverticulitis 01/16/2024   Edema    GERD (gastroesophageal reflux disease)    History of congestive heart failure    dx by cardiology   Hypertension    patient denies- patient stated that the PCP said it was for heart prevention.   Left leg pain 04/07/2019   LLQ pain 12/08/2015   Migraine headache    Urinary urgency 09/16/2015    Family History  Problem Relation Age of Onset   Diabetes Mother    Hypertension Mother    Diabetes Father    Hypertension Father    Kidney failure Father    Anxiety disorder Father    Hypertension Brother    Cancer Maternal Grandmother    Diabetes Maternal Grandmother    Pancreatic cancer Maternal Grandmother    Arthritis Maternal Grandmother    Diabetes Paternal Grandmother    Breast cancer Paternal Grandmother    Stroke Paternal Grandfather    Healthy Son    ADD / ADHD Son    Healthy Son    Asthma Son    Cancer Maternal Aunt        breast   Breast cancer Maternal Aunt    Birth  defects Maternal Aunt    Past Surgical History:  Procedure Laterality Date   ABDOMINAL HYSTERECTOMY  2012   CHOLECYSTECTOMY     COLONOSCOPY  12/28/2020   polyps   ESOPHAGEAL DILATION     gamma knife     04/2022   IR ANGIO INTRA EXTRACRAN SEL COM CAROTID INNOMINATE BILAT MOD SED  05/31/2021   IR ANGIO INTRA EXTRACRAN SEL COM CAROTID INNOMINATE BILAT MOD SED  06/27/2021   IR ANGIO INTRA EXTRACRAN SEL COM CAROTID INNOMINATE UNI R MOD SED  11/21/2021   IR ANGIO VERTEBRAL SEL VERTEBRAL UNI R MOD SED  05/31/2021   IR CT HEAD LTD  06/27/2021   IR CT HEAD LTD  11/21/2021   IR INTRA CRAN STENT  06/27/2021   IR INTRA CRAN STENT  11/21/2021   IR RADIOLOGIST EVAL & MGMT  06/03/2021   IR RADIOLOGIST EVAL & MGMT  07/13/2021   IR RADIOLOGIST EVAL & MGMT  08/27/2021   IR RADIOLOGIST EVAL & MGMT  10/30/2021   IR RADIOLOGIST EVAL & MGMT  12/06/2021   IR US  GUIDE VASC ACCESS RIGHT  05/31/2021   IR US  GUIDE VASC ACCESS RIGHT  06/27/2021   IR US  GUIDE VASC ACCESS RIGHT  11/21/2021   OVARIAN CYST REMOVAL     RADIOLOGY WITH ANESTHESIA N/A 06/27/2021   Procedure: STENTING;  Surgeon: Dolphus Carrion, MD;  Location: [facility] OR;  Service: Radiology;  Laterality: N/A;   RADIOLOGY WITH ANESTHESIA N/A 11/21/2021   Procedure: IR WITH ANESTHESIA STENTING;  Surgeon: Dolphus Carrion, MD;  Location: [facility] OR;  Service: Radiology;  Laterality: N/A;   Social History[1] Social History   Social History Narrative   In relationship, midwife in set designer facility, does a lot of walking and standing on the job, walks for exercise   Caffeine  use: Drinks tea (3 glasses per week)      Patient is right handed. She lives with her 2 children in a one story house. She drinks one large cup of coffee a day and an occasional tea or soda. She walks daily.      One story home        Immunization History  Administered Date(s) Administered   HPV 9-valent 04/11/2019   Hepb-cpg 01/08/2024   Influenza Inj Mdck Quad Pf  05/18/2018   Influenza, Seasonal, Injecte, Preservative Fre 05/08/2024   Influenza,inj,Quad PF,6+ Mos 05/20/2017, 05/14/2019, 06/02/2020, 05/09/2022   Influenza-Unspecified 05/11/2017, 05/15/2018, 05/08/2019, 07/13/2021   Janssen (J&J) SARS-COV-2 Vaccination 11/23/2019   PFIZER(Purple Top)SARS-COV-2 Vaccination 07/11/2020   PNEUMOCOCCAL CONJUGATE-20 10/18/2023   Pfizer(Comirnaty)Fall Seasonal Vaccine 12 years and older 01/15/2023, 10/18/2023   Pneumococcal Polysaccharide-23 04/08/2019, 07/15/2019   Tdap 07/02/2017, 04/09/2019     Objective: Vital Signs: BP 111/74   Pulse 99   Temp 98.3 F (36.8 C)   Resp 14   Ht 5' 6 (1.676 m)   Wt 170 lb (77.1 kg)   BMI 27.44 kg/m    Physical Exam Vitals and nursing note reviewed.  Constitutional:      Appearance: She is well-developed.  HENT:     Head: Normocephalic and atraumatic.  Eyes:     Conjunctiva/sclera: Conjunctivae normal.  Cardiovascular:     Rate and Rhythm: Normal rate and regular rhythm.     Heart sounds: Normal heart sounds.  Pulmonary:     Effort: Pulmonary effort is normal.     Breath sounds: Normal breath sounds.  Abdominal:     General: Bowel sounds are normal.     Palpations: Abdomen is soft.  Musculoskeletal:     Cervical back: Normal range of motion.  Lymphadenopathy:     Cervical: No cervical adenopathy.  Skin:    General: Skin is warm and dry.     Capillary Refill: Capillary refill takes less than 2 seconds.  Neurological:     Mental Status: She is alert and oriented to person, place, and time.  Psychiatric:        Behavior: Behavior normal.      Musculoskeletal Exam: Generalized hyperalgesia and positive tender points on exam.  C-spine has painful limited mobility especially with lateral rotation.  Trapezius muscle tension tenderness bilaterally.  Discomfort and stiffness with range of motion of both shoulders. Elbow joints, wrist joints, MCPs, PIPs, DIPs have good range of motion with no synovitis.   Complete fist formation bilaterally.  Hip joints have good range of motion with discomfort bilaterally.  Knee joints have good range of motion no warmth or effusion.  Ankle joints have good range of motion  no tenderness or joint swelling.     CDAI Exam: CDAI Score: -- Patient Global: --; Provider Global: -- Swollen: --; Tender: -- Joint Exam 09/11/2024   No joint exam has been documented for this visit   There is currently no information documented on the homunculus. Go to the Rheumatology activity and complete the homunculus joint exam.  Investigation: No additional findings.  Imaging: No results found.  Recent Labs: Lab Results  Component Value Date   WBC 7.7 06/09/2024   HGB 12.7 06/09/2024   PLT 289 06/09/2024   NA 143 07/28/2024   K 4.2 07/28/2024   CL 110 (H) 07/28/2024   CO2 18 (L) 07/28/2024   GLUCOSE 72 07/28/2024   BUN 8 07/28/2024   CREATININE 0.70 07/28/2024   BILITOT 0.3 06/09/2024   ALKPHOS 96 03/17/2024   AST 13 06/09/2024   ALT 8 06/09/2024   PROT 7.5 06/09/2024   ALBUMIN 4.3 03/17/2024   CALCIUM  9.9 07/28/2024   GFRAA 89 07/15/2020   QFTBGOLDPLUS NEGATIVE 12/14/2023    Speciality Comments: PLQ Eye Exam:  01/02/2024 WNL @ [facility] Follow up in 1 year   Procedures:  No procedures performed Allergies: Patient has no known allergies.   Assessment / Plan:     Visit Diagnoses: Seronegative rheumatoid arthritis (HCC) - 10/03/23: RF-, Anti-CCP-, 14-3-3 eta-, ESR WNL, CRP WNL.  U/s 09/3023: positive for synovitis: She continues to experience total body pain.  She has been experiencing generalized arthralgias and joint stiffness.  No synovitis was noted on examination.  She has been taking Plaquenil  200 mg 1 tablet twice daily and sulfasalazine  500 mg 2 tablets by mouth twice daily.  She is tolerating combination therapy.  Plan to check sed rate and CRP today. Plan to also refer the patient to pain management since her pain levels have been  inadequately controlled and she has difficulty performing ADLs. - Plan: Sedimentation rate, C-reactive protein  High risk medication use - Sulfasalazine  500 mg 2 tablets by mouth twice daily and plaquenil  200 mg 1 tablet by mouth twice daily. Previous tx; methotrexate -rash. CBC and CMP updated on 06/09/24. BMP updated on 07/28/24. Orders for CBC and CMP released today.   PLQ Eye Exam: 01/02/2024 WNL @ [facility] Follow up in 1 year  Discussed importance of holding sulfasalazine  if she develops signs or symptoms of infection and to resume once infection has completely cleared.  - Plan: CBC with Differential/Platelet, Comprehensive metabolic panel with GFR  Positive ANA (antinuclear antibody) - ANA 1:80 cytoplasmic, speckled on 03/27/22.  Repeat ANA on 07/12/22 negative.  ANCA negative on 11/06/2023. Patient is concerned that her symptoms may not be explained by rheumatoid arthritis and myofascial pain.  She has been experiencing more frequent falls and fatigue.  Plan to check CK today. Plan to check the following lab work today.    - Plan: Sedimentation rate, C-reactive protein, C3 and C4, Anti-DNA antibody, double-stranded, ANA, RNP Antibody, Anti-Smith antibody, Sjogrens syndrome-A extractable nuclear antibody, Sjogrens syndrome-B extractable nuclear antibody, Anti-scleroderma antibody  Primary osteoarthritis of both hands: PIP and DIP thickening consistent with osteoarthritis of both hands.  Plan to check sed rate and CRP today.  Raynaud's disease without gangrene: No symptoms of sclerodactyly.   Chronic pain of both knees: Crepitus with range of motion of both knees.  No warmth or effusion noted.  DDD (degenerative disc disease), cervical: Painful limited mobility of the cervical spine especially with lateral rotation.  Plan to refer the patient to pain  management.  Myofascial pain -She continues to have generalized hyperalgesia and positive tender points on exam.  She has been  experiencing total body pain on a daily basis.  She has also been experiencing more frequent falls.  Plan to check CK today.  Plan: CK  Frequent falls - She has been experiencing more frequent falls.  She feels uneasy while ambulating and has been nervous to stay alone at her apartment.  Her kids have been coming to check on her.   Plan to check ESR, CRP, and CK today.  Plan: CK  Degeneration of intervertebral disc of lumbar region without discogenic back pain or lower extremity pain: No symptoms of radiculopathy.    Pain in both feet: Good ROM of both ankle joints with no tenderness or joint swelling.   Other medical conditions are listed as follows:   Facial rash: No malar rash.   Photosensitivity  Essential hypertension: Blood pressure is 111/74 today in the office.    Type 2 diabetes mellitus without complication, without long-term current use of insulin  (HCC)  AVM (arteriovenous malformation) brain  Increased intracranial pressure  Hx of migraine headaches  Attention deficit hyperactivity disorder (ADHD), predominantly inattentive type  Gastroesophageal reflux disease with esophagitis without hemorrhage  PTSD (post-traumatic stress disorder)  Anxiety and depression  History of chronic CHF  Former smoker  Vitamin D  deficiency  Other fatigue  Cerebral venous thrombosis of sigmoid sinus    Orders: Orders Placed This Encounter  Procedures   Sedimentation rate   CBC with Differential/Platelet   Comprehensive metabolic panel with GFR   CK   C-reactive protein   C3 and C4   Anti-DNA antibody, double-stranded   ANA   RNP Antibody   Anti-Smith antibody   Sjogrens syndrome-A extractable nuclear antibody   Sjogrens syndrome-B extractable nuclear antibody   Anti-scleroderma antibody   No orders of the defined types were placed in this encounter.    Follow-Up Instructions: Return in about 3 months (around 12/10/2024) for Rheumatoid arthritis.   Waddell CHRISTELLA Craze, PA-C  Note - This record has been created using Dragon software.  Chart creation errors have been sought, but may not always  have been located. Such creation errors do not reflect on  the standard of medical care.     [1]  Social History Tobacco Use   Smoking status: Former    Current packs/day: 0.50    Average packs/day: 0.5 packs/day for 26.5 years (13.2 ttl pk-yrs)    Types: Cigarettes    Start date: 03/24/2019    Passive exposure: Current   Smokeless tobacco: Never   Tobacco comments:    06/24/21- quit over a year ago  Vaping Use   Vaping status: Never Used  Substance Use Topics   Alcohol  use: No   Drug use: No   "

## 2024-09-11 ENCOUNTER — Other Ambulatory Visit: Payer: Self-pay

## 2024-09-11 ENCOUNTER — Encounter: Payer: Self-pay | Admitting: Physician Assistant

## 2024-09-11 ENCOUNTER — Ambulatory Visit: Attending: Physician Assistant | Admitting: Physician Assistant

## 2024-09-11 VITALS — BP 111/74 | HR 99 | Temp 98.3°F | Resp 14 | Ht 66.0 in | Wt 170.0 lb

## 2024-09-11 DIAGNOSIS — F4310 Post-traumatic stress disorder, unspecified: Secondary | ICD-10-CM

## 2024-09-11 DIAGNOSIS — M25561 Pain in right knee: Secondary | ICD-10-CM | POA: Diagnosis not present

## 2024-09-11 DIAGNOSIS — Z87891 Personal history of nicotine dependence: Secondary | ICD-10-CM

## 2024-09-11 DIAGNOSIS — R296 Repeated falls: Secondary | ICD-10-CM

## 2024-09-11 DIAGNOSIS — M5030 Other cervical disc degeneration, unspecified cervical region: Secondary | ICD-10-CM

## 2024-09-11 DIAGNOSIS — M0600 Rheumatoid arthritis without rheumatoid factor, unspecified site: Secondary | ICD-10-CM

## 2024-09-11 DIAGNOSIS — M19041 Primary osteoarthritis, right hand: Secondary | ICD-10-CM

## 2024-09-11 DIAGNOSIS — M19042 Primary osteoarthritis, left hand: Secondary | ICD-10-CM

## 2024-09-11 DIAGNOSIS — M51369 Other intervertebral disc degeneration, lumbar region without mention of lumbar back pain or lower extremity pain: Secondary | ICD-10-CM | POA: Diagnosis not present

## 2024-09-11 DIAGNOSIS — Q282 Arteriovenous malformation of cerebral vessels: Secondary | ICD-10-CM

## 2024-09-11 DIAGNOSIS — I10 Essential (primary) hypertension: Secondary | ICD-10-CM

## 2024-09-11 DIAGNOSIS — G8929 Other chronic pain: Secondary | ICD-10-CM

## 2024-09-11 DIAGNOSIS — F900 Attention-deficit hyperactivity disorder, predominantly inattentive type: Secondary | ICD-10-CM

## 2024-09-11 DIAGNOSIS — R7689 Other specified abnormal immunological findings in serum: Secondary | ICD-10-CM

## 2024-09-11 DIAGNOSIS — R5383 Other fatigue: Secondary | ICD-10-CM

## 2024-09-11 DIAGNOSIS — I7300 Raynaud's syndrome without gangrene: Secondary | ICD-10-CM

## 2024-09-11 DIAGNOSIS — F32A Depression, unspecified: Secondary | ICD-10-CM

## 2024-09-11 DIAGNOSIS — M79671 Pain in right foot: Secondary | ICD-10-CM

## 2024-09-11 DIAGNOSIS — G932 Benign intracranial hypertension: Secondary | ICD-10-CM

## 2024-09-11 DIAGNOSIS — R21 Rash and other nonspecific skin eruption: Secondary | ICD-10-CM

## 2024-09-11 DIAGNOSIS — M7918 Myalgia, other site: Secondary | ICD-10-CM

## 2024-09-11 DIAGNOSIS — G08 Intracranial and intraspinal phlebitis and thrombophlebitis: Secondary | ICD-10-CM

## 2024-09-11 DIAGNOSIS — L568 Other specified acute skin changes due to ultraviolet radiation: Secondary | ICD-10-CM

## 2024-09-11 DIAGNOSIS — E119 Type 2 diabetes mellitus without complications: Secondary | ICD-10-CM

## 2024-09-11 DIAGNOSIS — Z79899 Other long term (current) drug therapy: Secondary | ICD-10-CM

## 2024-09-11 DIAGNOSIS — E559 Vitamin D deficiency, unspecified: Secondary | ICD-10-CM

## 2024-09-11 DIAGNOSIS — F419 Anxiety disorder, unspecified: Secondary | ICD-10-CM

## 2024-09-11 DIAGNOSIS — K2100 Gastro-esophageal reflux disease with esophagitis, without bleeding: Secondary | ICD-10-CM

## 2024-09-11 DIAGNOSIS — Z8669 Personal history of other diseases of the nervous system and sense organs: Secondary | ICD-10-CM

## 2024-09-11 DIAGNOSIS — Z8679 Personal history of other diseases of the circulatory system: Secondary | ICD-10-CM

## 2024-09-11 DIAGNOSIS — M79642 Pain in left hand: Secondary | ICD-10-CM

## 2024-09-11 DIAGNOSIS — M25562 Pain in left knee: Secondary | ICD-10-CM

## 2024-09-11 DIAGNOSIS — M79672 Pain in left foot: Secondary | ICD-10-CM

## 2024-09-11 NOTE — Progress Notes (Unsigned)
 Per Waddell Craze, PA-C, place referral to pain management.

## 2024-09-12 ENCOUNTER — Ambulatory Visit: Payer: Self-pay | Admitting: Physician Assistant

## 2024-09-12 NOTE — Progress Notes (Signed)
 CBC and CMP WNL ESR and CK WNL-great news!  Complements WNL

## 2024-09-14 LAB — CBC WITH DIFFERENTIAL/PLATELET
Absolute Lymphocytes: 3154 {cells}/uL (ref 850–3900)
Absolute Monocytes: 432 {cells}/uL (ref 200–950)
Basophils Absolute: 72 {cells}/uL (ref 0–200)
Basophils Relative: 1 %
Eosinophils Absolute: 151 {cells}/uL (ref 15–500)
Eosinophils Relative: 2.1 %
HCT: 39.8 % (ref 35.9–46.0)
Hemoglobin: 13.2 g/dL (ref 11.7–15.5)
MCH: 28.4 pg (ref 27.0–33.0)
MCHC: 33.2 g/dL (ref 31.6–35.4)
MCV: 85.8 fL (ref 81.4–101.7)
MPV: 10.7 fL (ref 7.5–12.5)
Monocytes Relative: 6 %
Neutro Abs: 3391 {cells}/uL (ref 1500–7800)
Neutrophils Relative %: 47.1 %
Platelets: 303 Thousand/uL (ref 140–400)
RBC: 4.64 Million/uL (ref 3.80–5.10)
RDW: 14.4 % (ref 11.0–15.0)
Total Lymphocyte: 43.8 %
WBC: 7.2 Thousand/uL (ref 3.8–10.8)

## 2024-09-14 LAB — RNP ANTIBODY: Ribonucleic Protein(ENA) Antibody, IgG: <1 AI

## 2024-09-14 LAB — ANTI-DNA ANTIBODY, DOUBLE-STRANDED: ds DNA Ab: 1 [IU]/mL

## 2024-09-14 LAB — COMPREHENSIVE METABOLIC PANEL WITH GFR
AG Ratio: 1.8 (calc) (ref 1.0–2.5)
ALT: 12 U/L (ref 6–29)
AST: 18 U/L (ref 10–35)
Albumin: 5.1 g/dL (ref 3.6–5.1)
Alkaline phosphatase (APISO): 113 U/L (ref 31–125)
BUN: 12 mg/dL (ref 7–25)
CO2: 23 mmol/L (ref 20–32)
Calcium: 10 mg/dL (ref 8.6–10.2)
Chloride: 107 mmol/L (ref 98–110)
Creat: 0.62 mg/dL (ref 0.50–0.99)
Globulin: 2.8 g/dL (ref 1.9–3.7)
Glucose, Bld: 73 mg/dL (ref 65–99)
Potassium: 4 mmol/L (ref 3.5–5.3)
Sodium: 139 mmol/L (ref 135–146)
Total Bilirubin: 0.5 mg/dL (ref 0.2–1.2)
Total Protein: 7.9 g/dL (ref 6.1–8.1)
eGFR: 110 mL/min/1.73m2

## 2024-09-14 LAB — CK: Total CK: 59 U/L (ref 20–239)

## 2024-09-14 LAB — SJOGRENS SYNDROME-B EXTRACTABLE NUCLEAR ANTIBODY: SSB (La) (ENA) Antibody, IgG: <1 AI

## 2024-09-14 LAB — C-REACTIVE PROTEIN: CRP: <3 mg/L

## 2024-09-14 LAB — ANTI-SCLERODERMA ANTIBODY: Scleroderma (Scl-70) (ENA) Antibody, IgG: <1 AI

## 2024-09-14 LAB — ANTI-SMITH ANTIBODY: ENA SM Ab Ser-aCnc: <1 AI

## 2024-09-14 LAB — C3 AND C4
C3 Complement: 153 mg/dL (ref 83–193)
C4 Complement: 25 mg/dL (ref 15–57)

## 2024-09-14 LAB — ANTI-NUCLEAR AB-TITER (ANA TITER): ANA Titer 1: 1:160 {titer} — ABNORMAL HIGH

## 2024-09-14 LAB — ANA: Anti Nuclear Antibody (ANA): POSITIVE — AB

## 2024-09-14 LAB — SEDIMENTATION RATE: Sed Rate: 9 mm/h (ref 0–20)

## 2024-09-14 LAB — SJOGRENS SYNDROME-A EXTRACTABLE NUCLEAR ANTIBODY: SSA (Ro) (ENA) Antibody, IgG: <1 AI

## 2024-09-15 NOTE — Progress Notes (Signed)
 ANA remains positive, rest of workup is unremarkable.

## 2024-09-24 ENCOUNTER — Other Ambulatory Visit: Payer: Self-pay | Admitting: Medical Genetics

## 2024-09-25 ENCOUNTER — Other Ambulatory Visit: Payer: Self-pay

## 2024-09-25 ENCOUNTER — Encounter (HOSPITAL_BASED_OUTPATIENT_CLINIC_OR_DEPARTMENT_OTHER): Payer: Self-pay

## 2024-09-25 ENCOUNTER — Emergency Department (HOSPITAL_BASED_OUTPATIENT_CLINIC_OR_DEPARTMENT_OTHER)
Admission: EM | Admit: 2024-09-25 | Discharge: 2024-09-25 | Disposition: A | Discharge status: Home / Self Care | Attending: Emergency Medicine | Admitting: Emergency Medicine

## 2024-09-25 ENCOUNTER — Emergency Department (HOSPITAL_BASED_OUTPATIENT_CLINIC_OR_DEPARTMENT_OTHER)

## 2024-09-25 DIAGNOSIS — M25511 Pain in right shoulder: Secondary | ICD-10-CM | POA: Insufficient documentation

## 2024-09-25 DIAGNOSIS — Z794 Long term (current) use of insulin: Secondary | ICD-10-CM | POA: Insufficient documentation

## 2024-09-25 DIAGNOSIS — Z7982 Long term (current) use of aspirin: Secondary | ICD-10-CM | POA: Diagnosis not present

## 2024-09-25 MED ORDER — IBUPROFEN 400 MG PO TABS
600.0000 mg | ORAL_TABLET | Freq: Once | ORAL | Status: AC
  Administered 2024-09-25: 600 mg via ORAL
  Filled 2024-09-25: qty 1

## 2024-09-25 NOTE — ED Notes (Signed)
 Discharge instructions reviewed with patient. Patient questions answered and opportunity for education reviewed. Patient voices understanding of discharge instructions with no further questions. Patient ambulatory with steady gait to lobby.

## 2024-09-25 NOTE — ED Triage Notes (Signed)
 R shoulder pain after mechanical fall on stairs yesterday  No obvious swelling or deformity noted in triage

## 2024-09-25 NOTE — ED Provider Notes (Signed)
 " Virginia Douglas [facility] Provider Note   CSN: [id number] Arrival date & time: 09/25/24  1255     Patient presents with: Shoulder Injury   Virginia Douglas is a 49 y.o. female with no significant past medical history presents with concern for right shoulder pain after a mechanical fall yesterday.  Reports that she tripped going down some stairs, and landed on her right shoulder.  Denies hitting her head or having any loss of consciousness.  She is not on any anticoagulation.  She denies pain elsewhere.  Reports pain in the shoulder worsens with movement.  Denies any paresthesias in her hands.    Shoulder Injury       Prior to Admission medications  Medication Sig Start Date End Date Taking? Authorizing Provider  aspirin  EC 81 MG tablet Take 81 mg by mouth daily. Swallow whole.    [provider]  atorvastatin  (LIPITOR) 10 MG tablet Take 1 tablet (10 mg total) by mouth daily. 05/08/24   Georgina Speaks, FNP  Blood Pressure Monitoring Soln KIT  01/01/23   [provider]  buPROPion  (WELLBUTRIN  XL) 150 MG 24 hr tablet Take 1 tablet (150 mg total) by mouth daily. 05/08/24   Georgina Speaks, FNP  carvedilol  (COREG ) 3.125 MG tablet Take 1 tablet (3.125 mg total) by mouth 2 (two) times daily. 08/26/24   Kate Lonni CROME, MD  cetirizine (ZYRTEC) 10 MG tablet Take 10 mg by mouth daily as needed for allergies.    [provider]  clonazePAM  (KLONOPIN ) 0.5 MG tablet Take 1 tablet (0.5 mg total) by mouth daily as needed. 05/08/24   Georgina Speaks, FNP  DULoxetine  (CYMBALTA ) 60 MG capsule TAKE 1 CAPSULE BY MOUTH EVERY DAY 12/31/23   Leigh Venetia CROME, MD  eszopiclone  (LUNESTA ) 2 MG TABS tablet Take 1 tablet (2 mg total) by mouth at bedtime. Take immediately before bedtime 05/08/24 05/08/25  Moore, Janece, FNP  famotidine  (PEPCID ) 20 MG tablet Take 1 tablet (20 mg total) by mouth 2 (two) times daily for 14 days. Patient taking differently: Take  20 mg by mouth as needed. 01/16/24 09/11/24  Hoy Nidia FALCON, PA-C  fluconazole  (DIFLUCAN ) 100 MG tablet Take 1 tablet (100 mg total) by mouth daily. Take 1 tablet by mouth now repeat in 5 days 01/24/24   Georgina Speaks, FNP  folic acid  (FOLVITE ) 1 MG tablet Take 2 tablets (2 mg total) by mouth daily. 12/18/23   Cheryl Waddell HERO, PA-C  Galcanezumab -gnlm (EMGALITY ) 120 MG/ML SOAJ Inject 120 mg into the skin every 30 (thirty) days. 06/05/24   Skeet Juliene SAUNDERS, DO  guanFACINE (INTUNIV) 1 MG TB24 ER tablet Take 1 mg by mouth daily. 12/07/22   [provider]  hydroxychloroquine  (PLAQUENIL ) 200 MG tablet Take 1 tablet (200 mg total) by mouth 2 (two) times daily. 02/05/24   Cheryl Waddell HERO, PA-C  insulin  aspart (FIASP  FLEXTOUCH) 100 UNIT/ML FlexTouch Pen INJECT [place] INSULIN  BEFORE MEALS AND AT HS IF BS < 150 - 0 UNITS, 150-199 - 3 UNITS, 200-249 - 6 UNITS, 250-299 - 9 UNITS, 300-349 - 12 UNITS, >350 - 15 UNITS. IF >400 CALL OUR OFFICE WITH YOUR BLOOD SUGAR READINGS. Patient taking differently: as needed. Inject [place] insulin  before meals and at hs if BS < 150 - 0 units, 150-199 - 3 units, 200-249 - 6 units, 250-299 - 9 units, 300-349 - 12 units, >350 - 15 units. If >400 call our office with your blood sugar readings. 04/03/23  Georgina Speaks, FNP  Insulin  Pen Needle (PEN NEEDLES) 32G X 4 MM MISC 1 each by Does not apply route once a week. 12/08/22   Georgina Speaks, FNP  Lancets [facility] DELICA PLUS [place]) MISC Apply 1 each topically 3 (three) times daily. 12/05/22   [provider]  LINZESS  72 MCG capsule TAKE 1 CAPSULE BY MOUTH DAILY BEFORE BREAKFAST. 01/12/23   Georgina Speaks, FNP  losartan  (COZAAR ) 25 MG tablet Take 1 tablet (25 mg total) by mouth daily. 07/30/24 10/28/24  Kate Lonni CROME, MD  ondansetron  (ZOFRAN ) 4 MG tablet Take 1 tablet (4 mg total) by mouth every 8 (eight) hours as needed. 11/15/23   Skeet Juliene SAUNDERS, DO  ondansetron  (ZOFRAN -ODT) 4 MG disintegrating tablet Take 1 tablet (4 mg total)  by mouth every 8 (eight) hours as needed for nausea. 01/16/24   Hoy Nidia FALCON, PA-C  Polyethyl Glycol-Propyl Glycol (SYSTANE) 0.4-0.3 % SOLN Place 1 drop into both eyes daily as needed (dry eyes).    [provider]  spironolactone  (ALDACTONE ) 25 MG tablet Take 0.5 tablets (12.5 mg total) by mouth daily. 11/06/22 09/11/24  Kate Lonni CROME, MD  sulfaSALAzine  (AZULFIDINE ) 500 MG tablet Take 2 tablets (1,000 mg total) by mouth 2 (two) times daily. 04/07/24   Cheryl Waddell HERO, PA-C  tirzepatide  (MOUNJARO ) 10 MG/0.5ML Pen INJECT 10 MG INTO THE SKIN ONE TIME PER WEEK 06/12/24   Georgina Speaks, FNP  topiramate  (TOPAMAX ) 100 MG tablet TAKE 2 TABLETS BY MOUTH AT BEDTIME. 06/05/24   Skeet, Adam R, DO  Ubrogepant  (UBRELVY ) 100 MG TABS Take 1 tablet (100 mg total) by mouth as needed. May repeat after 2 hours.  Maximum 2 tablets. 06/05/24   Jaffe, Adam R, DO  Vitamin D , Ergocalciferol , (DRISDOL ) 1.25 MG (50000 UNIT) CAPS capsule TAKE 1 CAPSULE BY MOUTH EVERY 7 DAYS **NOT COVERED** 10/25/23   Georgina Speaks, FNP  metFORMIN  (GLUCOPHAGE -XR) 500 MG 24 hr tablet Take 2 tablets (1,000 mg total) by mouth 2 (two) times daily. Patient not taking: Reported on 06/04/2024 03/10/16 08/15/19  Darra Fonda MATSU, MD  Phentermine -Topiramate  11.25-69 MG CP24 Take 1 tablet by mouth daily. Patient not taking: Reported on 06/04/2024 02/04/19 04/07/19  Moore, Janece, FNP    Allergies: Patient has no known allergies.    Review of Systems  Musculoskeletal:        Right shoulder pain    Updated Vital Signs BP 131/84   Pulse 88   Temp 98.2 F (36.8 C) (Oral)   Resp 17   SpO2 99%   Physical Exam Vitals and nursing note reviewed.  Constitutional:      Appearance: Normal appearance.  HENT:     Head: Normocephalic and atraumatic.     Comments: No ecchymosis, hematomas, or abrasions Cardiovascular:     Comments: Radial pulse 2+ bilaterally Pulmonary:     Effort: Pulmonary effort is normal.  Musculoskeletal:     Comments:  Right upper extremity:  General No obvious deformity. No erythema, edema, contusions, open wounds   Palpation Diffusely tender to the soft tissues overlying the anterior aspect of the humerus, pectoralis muscle, and supraspinatus and trapezius muscle over the right side.  Tender over the [facility] joint.  The non-tender to palpation along the clavicle.  No point tenderness palpation over the humerus, radius, ulna.  No tenderness over the cervical, thoracic, or lumbar spine  ROM Difficulty with abduction of the right shoulder, abducts to approximately 90 degrees.  Able to fully abduct on the left shoulder.  Full internal and external rotation of shoulders bilaterally, full flexion and extension of the shoulders bilaterally  Full flexion and extension at the elbows and wrist bilaterally  Special tests No drop arm sign.  Pain reproduced with empty can testing  Sensation: Sensation intact throughout the upper extremity  Strength: 5/5 strength with supraspinatus, infraspinatus, subscapularis testing    Neurological:     General: No focal deficit present.     Mental Status: She is alert.  Psychiatric:        Mood and Affect: Mood normal.        Behavior: Behavior normal.     (all labs ordered are listed, but only abnormal results are displayed) Labs Reviewed - No data to display  EKG: None  Radiology: DG Shoulder Right Result Date: 09/25/2024 CLINICAL DATA:  Status post fall. EXAM: RIGHT SHOULDER - 2+ VIEW COMPARISON:  None Available. FINDINGS: There is no evidence of fracture or dislocation. There is no evidence of arthropathy or other focal bone abnormality. Soft tissues are unremarkable. IMPRESSION: Negative. Electronically Signed   By: Suzen Dials M.D.   On: 09/25/2024 13:49     Procedures   Medications Ordered in the ED  ibuprofen  (ADVIL ) tablet 600 mg (600 mg Oral Given 09/25/24 1339)                                    Medical Decision Making Amount and/or  Complexity of Data Reviewed Radiology: ordered.    Differential diagnosis includes but is not limited to fracture, dislocation, sprain, strain, contusion, laceration, nerve injury, vascular injury, compartment syndrome  ED Course:  Upon initial evaluation, patient is well-appearing, no acute distress.  Patient diffusely tender over the anterior aspect of her right shoulder.  No point tenderness to palpation over the clavicle, humerus, radius, ulna.  No tenderness of the cervical, thoracic, or lumbar spine.  She is mostly tender over the pectoralis muscle, humeral head, and supraspinatus muscle.  She has difficulty with full abduction of the right shoulder, but otherwise intact range of motion of the shoulders bilaterally.  She is neurovascularly intact in the bilateral upper extremities, no concern for nerve or vascular injury at this time.  She denies hitting her head or having any loss consciousness, no signs of head trauma on exam, she is not on any anticoagulation, no indication for head CT at this time.  Imaging Studies ordered: I ordered imaging studies including x-ray right shoulder I independently visualized the imaging with scope of interpretation limited to determining acute life threatening conditions related to emergency care. Imaging showed no acute abnormality I agree with the radiologist interpretation    Medications Given: Ibuprofen   Imaging was reviewed which revealed no acute abnormality.  Given that she fell onto her right shoulder, I suspect that the diffuse tenderness is secondary to soft tissue/bone contusion.  However, given the difficulty and pain with range of motion, and tenderness over the supraspinatus muscle, she may also have a small tear of her rotator cuff muscle.  She still has mostly intact range of motion, aside from some difficulties with abduction of the right shoulder.  Negative drop arm sign. No concern for full tendon rupture at this time. Will provide  shoulder sling for comfort and have her follow-up with orthopedics.  Patient stable and appropriate for discharge home.   Impression: Right shoulder contusion and possible rotator cuff strain     Disposition:  Patient discharged home with instructions to take Tylenol  and ibuprofen  as needed for pain.  Use sling as needed for comfort and wean out over the next 2 to 3 days as pain allows.  Perform gentle range of motion of the right shoulder to help prevent stiffness.  Follow-up with orthopedics, contact information for the [facility] sports medicine office was provided.  She understands to call to schedule appointment within the next week for follow-up. Return precautions given and patient verbalized understanding.     This chart was dictated using voice recognition software, Dragon. Despite the best efforts of this provider to proofread and correct errors, errors may still occur which can change documentation meaning.      Final diagnoses:  Acute pain of right shoulder    ED Discharge Orders     None          Veta Palma, PA-C 09/25/24 1522    Ruthe Cornet, DO 09/27/24 1119  "

## 2024-09-25 NOTE — Discharge Instructions (Signed)
 The x-ray of your right shoulder does not show any acute injury such as a fracture or dislocation.  Based on your exam, I suspect that you bruised your shoulder bone/muscle, but also may have a slight tear of your rotator cuff muscle.  This will heal with time.  You were provided a shoulder sling to wear for comfort.  Please wean out of the sling as tolerated over the next 2-3 days.  Perform gentle range of motion exercises with your right shoulder to help prevent stiffness and help to strengthen the shoulder muscles  Avoid movements and activities that are painful in the first couple of days after the injury. Elevate the area of injury if able to help limit swelling. Apply ice to the area for the first 24 hours, then switch to heat.   You may take up to 1000mg  of tylenol  every 6 hours as needed for pain. Do not take more then 4g per day.   You may use up to 600mg  ibuprofen  every 6 hours as needed for pain.  Do not exceed 2.4g of ibuprofen  per day.  You were given a first dose here today.  Please follow-up with the sports medicine clinic listed below for further management of your right shoulder pain if pain is not improving over the next week.  Gradually return to activity as pain allows. Try to engage in non-painful types of physical activity/exercise to increase blood flow to your area of injury.  Return to the ER for any numbness, uncontrolled pain, any other new or concerning symptoms

## 2024-09-30 ENCOUNTER — Telehealth (INDEPENDENT_AMBULATORY_CARE_PROVIDER_SITE_OTHER): Payer: Self-pay | Admitting: Nurse Practitioner

## 2024-09-30 ENCOUNTER — Encounter: Payer: Self-pay | Admitting: Nurse Practitioner

## 2024-09-30 DIAGNOSIS — M25511 Pain in right shoulder: Secondary | ICD-10-CM

## 2024-09-30 DIAGNOSIS — M797 Fibromyalgia: Secondary | ICD-10-CM

## 2024-09-30 DIAGNOSIS — R296 Repeated falls: Secondary | ICD-10-CM | POA: Diagnosis not present

## 2024-09-30 NOTE — Assessment & Plan Note (Signed)
 She has had multiple falls since December with the most recent fallen down to sit flight of stairs and injuring her right shoulder.  She feels like she is unsafe at home to stay by herself and would like a letter indicating that she needs to move in with family to help support her physical decline.  I will also get a CT scan without contrast to evaluate for any changes.  Her rheumatologist is concerned that she may have MS she is seen by Dr. Skeet for her migraines and I have recommended that she discuss with him about the possibility of having MS if she needs a new referral we will do that.

## 2024-09-30 NOTE — Assessment & Plan Note (Signed)
 She was seen by Dr. Dolphus in 2022 and display symptoms that were consistent with fibromyalgia syndrome.  She is unsure but she feels like her symptoms are worsening causing her difficulty with buttoning her close.,  Getting dressed, taking her little longer to drive.  She also feels like it is affecting her ability to stay home alone.

## 2024-09-30 NOTE — Assessment & Plan Note (Signed)
 She was seen in the ER had x-rays which were negative for any fractures.  She is wearing a brace during the day.  Continues to have pain worse at night and limitations with movement.  Will refer to orthopedics for further evaluation.  I did share with her that she can always go to an orthopedic urgent care for any issues with her joints or back in the future.

## 2024-09-30 NOTE — Progress Notes (Signed)
 "  Virtual Visit via Video Note  Virginia Douglas, CMA,acting as a scribe for Virginia Ada, FNP.,have documented all relevant documentation on the behalf of Virginia Ada, FNP,as directed by  Virginia Ada, FNP while in the presence of Virginia Ada, FNP.  I connected with Virginia Douglas on 09/30/24 at  2:20 PM EST by a video enabled telemedicine application and verified that I am speaking with the correct person using two identifiers.  Patient Location: Home Provider Location: Home Office  I discussed the limitations, risks, security, and privacy concerns of performing an evaluation and management service by video and the availability of in person appointments. I also discussed with the patient that there may be a patient responsible charge related to this service. The patient expressed understanding and agreed to proceed.  Subjective: PCP: Douglas Gaines, FNP  Chief Complaint  Patient presents with   Hospitalization Follow-up    Patient presents today for a hospital follow up, patient reports compliance with medications. Patient denies any chest pain, SOB, or headaches. Patient was admitted 09/25/24 and discharged same day. Patient reports today they are feeling better but still having pain would like to see ortho.    She fell down a flight of stairs on 09/23/2024, while at her aunts house during the repass of her grandfather. She landed on her shoulder and her arm. She went to the ER no fractures, unable to lift above her head and hurts worse at night. She has lidocaine  muscle rub. When she moves it she will here clicking. She has numbness and tingling to radiating down her arm.   She has not seen an orthopedic in the past. She is using her heating pad most times.  Her fibromyalgia and rheumatoid symptoms are worsening, it is difficult for her to close up her buttons on her shirts. Her Rheumatologist took her off her methotrexate  and added sulfasalzine (recently started back taking  it) due to having frequent yeast infections.   She began having more falls over the last month. She has fallen down the stairs, prior to this she fell a few week before she had where her legs gave away. She feels her ADLs are being affected.      ROS: Per HPI Current Medications[1]  Observations/Objective: There were no vitals filed for this visit. Physical Exam Vitals and nursing note reviewed.  Constitutional:      General: She is not in acute distress.    Appearance: Normal appearance. She is obese.  Pulmonary:     Effort: Pulmonary effort is normal. No respiratory distress.  Musculoskeletal:     Right shoulder: Decreased range of motion.     Comments: She is unable to raise her right arm or straighten out. Unable to internally rotate. During virtual visit she does not move the arm much it is guarded.    Skin:    Capillary Refill: Capillary refill takes less than 2 seconds.  Neurological:     General: No focal deficit present.     Mental Status: She is alert and oriented to person, place, and time.     Cranial Nerves: No cranial nerve deficit.  Psychiatric:        Mood and Affect: Mood normal.        Behavior: Behavior normal.        Thought Content: Thought content normal.        Judgment: Judgment normal.     Assessment and Plan: Acute pain of right shoulder Assessment & Plan: She  was seen in the ER had x-rays which were negative for any fractures.  She is wearing a brace during the day.  Continues to have pain worse at night and limitations with movement.  Will refer to orthopedics for further evaluation.  I did share with her that she can always go to an orthopedic urgent care for any issues with her joints or back in the future.  Orders: -     Ambulatory referral to Orthopedics -     CT HEAD WO CONTRAST ( ); Future  Falls frequently Assessment & Plan: She has had multiple falls since December with the most recent fallen down to sit flight of stairs and  injuring her right shoulder.  She feels like she is unsafe at home to stay by herself and would like a letter indicating that she needs to move in with family to help support her physical decline.  I will also get a CT scan without contrast to evaluate for any changes.  Her rheumatologist is concerned that she may have MS she is seen by Dr. Skeet for her migraines and I have recommended that she discuss with him about the possibility of having MS if she needs a new referral we will do that.  Orders: -     CT HEAD WO CONTRAST ( ); Future -     Ambulatory referral to Physical Therapy  Fibromyalgia Assessment & Plan: She was seen by Dr. Dolphus in 2022 and display symptoms that were consistent with fibromyalgia syndrome.  She is unsure but she feels like her symptoms are worsening causing her difficulty with buttoning her close.,  Getting dressed, taking her little longer to drive.  She also feels like it is affecting her ability to stay home alone.     Follow Up Instructions: Return if symptoms worsen or fail to improve.   I discussed the assessment and treatment plan with the patient. The patient was provided an opportunity to ask questions, and all were answered. The patient agreed with the plan and demonstrated an understanding of the instructions.   The patient was advised to call back or seek an in-person evaluation if the symptoms worsen or if the condition fails to improve as anticipated.  The above assessment and management plan was discussed with the patient. The patient verbalized understanding of and has agreed to the management plan.   Virginia Virginia Ada, FNP, have reviewed all documentation for this visit. The documentation on 09/30/24 for the exam, diagnosis, procedures, and orders are all accurate and complete.      [1]  Current Outpatient Medications:    aspirin  EC 81 MG tablet, Take 81 mg by mouth daily. Swallow whole., Disp: , Rfl:    atorvastatin  (LIPITOR) 10 MG tablet,  Take 1 tablet (10 mg total) by mouth daily., Disp: 90 tablet, Rfl: 1   Blood Pressure Monitoring Soln KIT, , Disp: , Rfl:    buPROPion  (WELLBUTRIN  XL) 150 MG 24 hr tablet, Take 1 tablet (150 mg total) by mouth daily., Disp: 90 tablet, Rfl: 1   carvedilol  (COREG ) 3.125 MG tablet, Take 1 tablet (3.125 mg total) by mouth 2 (two) times daily., Disp: 180 tablet, Rfl: 3   cetirizine (ZYRTEC) 10 MG tablet, Take 10 mg by mouth daily as needed for allergies., Disp: , Rfl:    clonazePAM  (KLONOPIN ) 0.5 MG tablet, Take 1 tablet (0.5 mg total) by mouth daily as needed., Disp: 30 tablet, Rfl: 1   DULoxetine  (CYMBALTA ) 60 MG capsule, TAKE 1 CAPSULE BY  MOUTH EVERY DAY, Disp: 90 capsule, Rfl: 1   eszopiclone  (LUNESTA ) 2 MG TABS tablet, Take 1 tablet (2 mg total) by mouth at bedtime. Take immediately before bedtime, Disp: 30 tablet, Rfl: 5   famotidine  (PEPCID ) 20 MG tablet, Take 1 tablet (20 mg total) by mouth 2 (two) times daily for 14 days. (Patient taking differently: Take 20 mg by mouth as needed.), Disp: 28 tablet, Rfl: 0   fluconazole  (DIFLUCAN ) 100 MG tablet, Take 1 tablet (100 mg total) by mouth daily. Take 1 tablet by mouth now repeat in 5 days, Disp: 2 tablet, Rfl: 0   folic acid  (FOLVITE ) 1 MG tablet, Take 2 tablets (2 mg total) by mouth daily., Disp: 180 tablet, Rfl: 3   Galcanezumab -gnlm (EMGALITY ) 120 MG/ML SOAJ, Inject 120 mg into the skin every 30 (thirty) days., Disp: 1.12 mL, Rfl: 11   guanFACINE (INTUNIV) 1 MG TB24 ER tablet, Take 1 mg by mouth daily., Disp: , Rfl:    hydroxychloroquine  (PLAQUENIL ) 200 MG tablet, Take 1 tablet (200 mg total) by mouth 2 (two) times daily., Disp: 180 tablet, Rfl: 0   insulin  aspart (FIASP  FLEXTOUCH) 100 UNIT/ML FlexTouch Pen, INJECT [place] INSULIN  BEFORE MEALS AND AT HS IF BS < 150 - 0 UNITS, 150-199 - 3 UNITS, 200-249 - 6 UNITS, 250-299 - 9 UNITS, 300-349 - 12 UNITS, >350 - 15 UNITS. IF >400 CALL OUR OFFICE WITH YOUR BLOOD SUGAR READINGS. (Patient taking differently: as  needed. Inject [place] insulin  before meals and at hs if BS < 150 - 0 units, 150-199 - 3 units, 200-249 - 6 units, 250-299 - 9 units, 300-349 - 12 units, >350 - 15 units. If >400 call our office with your blood sugar readings.), Disp: 15 mL, Rfl: 1   Insulin  Pen Needle (PEN NEEDLES) 32G X 4 MM MISC, 1 each by Does not apply route once a week., Disp: 30 each, Rfl: 3   Lancets (ONETOUCH DELICA PLUS LANCET33G) MISC, Apply 1 each topically 3 (three) times daily., Disp: , Rfl:    LINZESS  72 MCG capsule, TAKE 1 CAPSULE BY MOUTH DAILY BEFORE BREAKFAST., Disp: 30 capsule, Rfl: 5   losartan  (COZAAR ) 25 MG tablet, Take 1 tablet (25 mg total) by mouth daily., Disp: 90 tablet, Rfl: 3   ondansetron  (ZOFRAN ) 4 MG tablet, Take 1 tablet (4 mg total) by mouth every 8 (eight) hours as needed., Disp: 20 tablet, Rfl: 5   ondansetron  (ZOFRAN -ODT) 4 MG disintegrating tablet, Take 1 tablet (4 mg total) by mouth every 8 (eight) hours as needed for nausea., Disp: 10 tablet, Rfl: 0   Polyethyl Glycol-Propyl Glycol (SYSTANE) 0.4-0.3 % SOLN, Place 1 drop into both eyes daily as needed (dry eyes)., Disp: , Rfl:    spironolactone  (ALDACTONE ) 25 MG tablet, Take 0.5 tablets (12.5 mg total) by mouth daily., Disp: 45 tablet, Rfl: 3   sulfaSALAzine  (AZULFIDINE ) 500 MG tablet, Take 2 tablets (1,000 mg total) by mouth 2 (two) times daily., Disp: 360 tablet, Rfl: 0   tirzepatide  (MOUNJARO ) 10 MG/0.5ML Pen, INJECT 10 MG INTO THE SKIN ONE TIME PER WEEK, Disp: 84 mL, Rfl: 0   topiramate  (TOPAMAX ) 100 MG tablet, TAKE 2 TABLETS BY MOUTH AT BEDTIME., Disp: 180 tablet, Rfl: 1   Ubrogepant  (UBRELVY ) 100 MG TABS, Take 1 tablet (100 mg total) by mouth as needed. May repeat after 2 hours.  Maximum 2 tablets., Disp: 16 tablet, Rfl: 5   Vitamin D , Ergocalciferol , (DRISDOL ) 1.25 MG (50000 UNIT) CAPS capsule, TAKE 1 CAPSULE BY  MOUTH EVERY 7 DAYS **NOT COVERED**, Disp: 12 capsule, Rfl: 0  "

## 2024-10-01 ENCOUNTER — Encounter: Payer: Self-pay | Admitting: Orthopedic Surgery

## 2024-10-01 ENCOUNTER — Other Ambulatory Visit (INDEPENDENT_AMBULATORY_CARE_PROVIDER_SITE_OTHER): Payer: Self-pay

## 2024-10-01 ENCOUNTER — Other Ambulatory Visit: Payer: Self-pay | Admitting: Nurse Practitioner

## 2024-10-01 ENCOUNTER — Ambulatory Visit (INDEPENDENT_AMBULATORY_CARE_PROVIDER_SITE_OTHER): Admitting: Orthopedic Surgery

## 2024-10-01 DIAGNOSIS — M542 Cervicalgia: Secondary | ICD-10-CM | POA: Diagnosis not present

## 2024-10-01 DIAGNOSIS — M25511 Pain in right shoulder: Secondary | ICD-10-CM | POA: Diagnosis not present

## 2024-10-01 NOTE — Progress Notes (Signed)
 "  Office Visit Note   Patient: Virginia Douglas           Date of Birth: [birth year]           MRN: [id number] Visit Date: 10/01/2024 Requested by: Georgina Speaks, FNP [address] [place],  [place] [address] PCP: Georgina Speaks, FNP  Subjective: Chief Complaint  Patient presents with   Right Shoulder - Pain, Injury   Neck - Pain    HPI: Virginia Douglas is a 49 y.o. female who presents to the office reporting right shoulder pain.  Her chart lifting her arm.  Radiates from the neck down the arm.  Describes new clicking in the shoulder.  Patient states her right arm feels weak.  Patient had no problem before a fall on 09/24/2024.  She fell down the stairs and landed on the right shoulder.  Radiographs from the emergency department showed no fracture.  Reports decreased range of motion secondary to pain.  Tylenol  extra strength not helpful.  Does administrative work using keyboards.  This has affected her efficiency with the right arm.  Also wakes her from sleep at night..                ROS: All systems reviewed are negative as they relate to the chief complaint within the history of present illness.  Patient denies fevers or chills.  Assessment & Plan: Visit Diagnoses:  1. Neck pain on right side   2. Acute pain of right shoulder     Plan: Impression is right shoulder rotator cuff tear with weakness following fall.  She has less than 90 degrees of active forward flexion and abduction.  Need MRI arthrogram to evaluate rotator cuff tear.  Follow-up after that study.  Follow-Up Instructions: No follow-ups on file.   Orders:  Orders Placed This Encounter  Procedures   XR Cervical Spine 2 or 3 views   MR SHOULDER RIGHT W CONTRAST   Arthrogram   No orders of the defined types were placed in this encounter.     Procedures: No procedures performed   Clinical Data: No additional findings.  Objective: Vital Signs: There were no vitals taken for this  visit.  Physical Exam:  Constitutional: Patient appears well-developed HEENT:  Head: Normocephalic Eyes:EOM are normal Neck: Normal range of motion Cardiovascular: Normal rate Pulmonary/chest: Effort normal Neurologic: Patient is alert Skin: Skin is warm Psychiatric: Patient has normal mood and affect  Ortho Exam: Ortho exam demonstrates range of motion on the left of 75/110/170 range of motion on the right is 75/85/90.  Does have weakness to external rotation on the right with intact subscap strength and no Popeye deformity on the right-hand side.  No discrete AC joint tenderness is present.  Cervical spine range of motion intact.  Specialty Comments:  No specialty comments available.  Imaging: XR Cervical Spine 2 or 3 views Result Date: 10/01/2024 AP lateral cervical spine radiographs reviewed normal lordosis is present.  No significant degenerative changes between the vertebral bodies or in the facet joints.  No spondylolisthesis or compression fracture.    PMFS History: Patient Active Problem List   Diagnosis Date Noted   Fibromyalgia 09/30/2024   Falls frequently 09/30/2024   Acute pain of right shoulder 09/30/2024   Hot flashes 06/04/2024   Hair loss 06/04/2024   Need for influenza vaccination 05/20/2024   Other fatigue 01/17/2024   Abnormal weight loss 01/17/2024   Diverticulosis 01/17/2024   Rash and nonspecific skin eruption 01/17/2024  Influenza vaccination declined 11/02/2023   COVID-19 vaccine administered 11/02/2023   Need for pneumococcal vaccination 11/02/2023   Class 1 obesity due to excess calories with body mass index (BMI) of 30.0 to 30.9 in adult 11/02/2023   Fall 10/18/2023   Acute left ankle pain 10/18/2023   HFrEF (heart failure with reduced ejection fraction) (HCC) 11/09/2022   Type 2 diabetes mellitus with obesity 11/08/2022   DKA (diabetic ketoacidosis) (HCC) 10/13/2022   Cerebral venous thrombosis of sigmoid sinus 11/21/2021   Pulsatile  tinnitus of right ear 11/21/2021   Pulsatile tinnitus, bilateral 06/27/2021   AVM (arteriovenous malformation) brain 05/31/2021   DDD (degenerative disc disease), cervical 12/06/2020   DDD (degenerative disc disease), lumbar 12/06/2020   Gastroesophageal reflux disease with esophagitis without hemorrhage 12/06/2020   Increased intracranial pressure 12/06/2020   PTSD (post-traumatic stress disorder) 12/06/2020   Attention deficit hyperactivity disorder (ADHD), predominantly inattentive type 12/06/2020   Vitamin D  deficiency 12/06/2020   Obesity (BMI 30-39.9) 03/15/2020   Sciatic nerve pain 12/10/2019   Grief 06/05/2019   Migraine 06/05/2019   Depression 04/07/2019   Obesity due to excess calories without serious comorbidity 04/07/2019   Anxiety 04/07/2019   Benign hypertensive heart disease with congestive heart failure and with combined systolic and diastolic dysfunction (HCC) 12/08/2015   Type 2 diabetes mellitus with obesity 12/08/2015   Past Medical History:  Diagnosis Date   Acute pain of left shoulder 06/05/2019   ADHD (attention deficit hyperactivity disorder)    Anemia    iron deficiency   as a teenager   Anxiety    Arthritis    Chronic combined systolic and diastolic congestive heart failure (HCC) 11/23/2022   This is a new diagnosis with Cardiology, she is scheduled on 2/14 for her MRI of her heart.     Depression    Diabetes (HCC)    Diverticulitis 01/16/2024   Edema    GERD (gastroesophageal reflux disease)    History of congestive heart failure    dx by cardiology   Hypertension    patient denies- patient stated that the PCP said it was for heart prevention.   Left leg pain 04/07/2019   LLQ pain 12/08/2015   Migraine headache    Urinary urgency 09/16/2015    Family History  Problem Relation Age of Onset   Diabetes Mother    Hypertension Mother    Diabetes Father    Hypertension Father    Kidney failure Father    Anxiety disorder Father    Hypertension  Brother    Cancer Maternal Grandmother    Diabetes Maternal Grandmother    Pancreatic cancer Maternal Grandmother    Arthritis Maternal Grandmother    Diabetes Paternal Grandmother    Breast cancer Paternal Grandmother    Stroke Paternal Grandfather    Healthy Son    ADD / ADHD Son    Healthy Son    Asthma Son    Cancer Maternal Aunt        breast   Breast cancer Maternal Aunt    Birth defects Maternal Aunt     Past Surgical History:  Procedure Laterality Date   ABDOMINAL HYSTERECTOMY  2012   CHOLECYSTECTOMY     COLONOSCOPY  12/28/2020   polyps   ESOPHAGEAL DILATION     gamma knife     04/2022   IR ANGIO INTRA EXTRACRAN SEL COM CAROTID INNOMINATE BILAT MOD SED  05/31/2021   IR ANGIO INTRA EXTRACRAN SEL COM CAROTID INNOMINATE BILAT MOD SED  06/27/2021   IR ANGIO INTRA EXTRACRAN SEL COM CAROTID INNOMINATE UNI R MOD SED  11/21/2021   IR ANGIO VERTEBRAL SEL VERTEBRAL UNI R MOD SED  05/31/2021   IR CT HEAD LTD  06/27/2021   IR CT HEAD LTD  11/21/2021   IR INTRA CRAN STENT  06/27/2021   IR INTRA CRAN STENT  11/21/2021   IR RADIOLOGIST EVAL & MGMT  06/03/2021   IR RADIOLOGIST EVAL & MGMT  07/13/2021   IR RADIOLOGIST EVAL & MGMT  08/27/2021   IR RADIOLOGIST EVAL & MGMT  10/30/2021   IR RADIOLOGIST EVAL & MGMT  12/06/2021   IR US  GUIDE VASC ACCESS RIGHT  05/31/2021   IR US  GUIDE VASC ACCESS RIGHT  06/27/2021   IR US  GUIDE VASC ACCESS RIGHT  11/21/2021   OVARIAN CYST REMOVAL     RADIOLOGY WITH ANESTHESIA N/A 06/27/2021   Procedure: STENTING;  Surgeon: Dolphus Carrion, MD;  Location: [facility] OR;  Service: Radiology;  Laterality: N/A;   RADIOLOGY WITH ANESTHESIA N/A 11/21/2021   Procedure: IR WITH ANESTHESIA STENTING;  Surgeon: Dolphus Carrion, MD;  Location: [facility] OR;  Service: Radiology;  Laterality: N/A;   Social History   Occupational History   Occupation: Geophysical Data Processor: [employer]  Tobacco Use   Smoking status: Former    Current  packs/day: 0.50    Average packs/day: 0.5 packs/day for 26.5 years (13.3 ttl pk-yrs)    Types: Cigarettes    Start date: 03/24/2019    Passive exposure: Current   Smokeless tobacco: Never   Tobacco comments:    06/24/21- quit over a year ago  Vaping Use   Vaping status: Never Used  Substance and Sexual Activity   Alcohol  use: No   Drug use: No   Sexual activity: Yes    Birth control/protection: Surgical        "

## 2024-10-01 NOTE — Progress Notes (Unsigned)
 " Cardiology Office Note:    Date:  10/01/2024   ID:  Virginia Douglas, DOB [birth year], MRN [id number]  PCP:  Virginia Speaks, FNP  Cardiologist:  Virginia LITTIE Nanas, MD  Electrophysiologist:  None   Referring MD: Virginia Speaks, FNP   No chief complaint on file.   History of Present Illness:    Virginia Douglas is a 49 y.o. female with a hx of recently diagnosed combined systolic and diastolic heart failure, GERD, hypertension, diabetes who presents for follow-up.  She was referred by Douglas Georgina, NP for evaluation of abnormal EKG, initially seen on 08/07/2022.  She reports having chest pain which occurs about once per week.  Describes as sharp pain in center of her chest, lasts for 30 seconds or so.  She does not exercise but reports she is short of breath with minimal exertion, such as walking up stairs.  She reports having lightheadedness but denies any recent syncope.  Does report lower extremity swelling.  Reports palpitations where feels like heart is racing, occurs about once every 2 weeks.  She smoked 1 pack/day x 16 years, quit age 34.  No drug use.  Alcohol  on special occasions.  Family history includes father had CVA age 74.  Echocardiogram 09/01/2022 showed EF 30 to 35%, normal RV function, no significant valvular disease.  Coronary CTA on 08/25/2022 showed normal coronary arteries.  Cardiac MRI 10/18/2022 showed LVEF 44%, RVEF 52%, no LGE.  Zio patch x 13 days 09/2022 showed no significant arrhythmias.  Echocardiogram 06/18/2023 showed EF 45%, normal RV function, mild MR/AI.  Echocardiogram 07/28/2024 showed EF 40 to 45%, G3 DD, normal RV function.  Since last clinic visit,  she reports she is doing okay.  She had ED visit in July for chest pain, work up unremarkable.  No chest pain since.  She denies any shortness of breath.  Reports rare lightheadedness but denies any syncope.  Does report intermittent lower extremity swelling.  Reports rare palpitations.  She walks her  dog for exercise.  She has lost 50 pounds over the last 2 years.  She has been off Entresto  and Jardiance  for months because was having itching.  She stopped both medications and itching resolved.   Wt Readings from Last 3 Encounters:  09/11/24 170 lb (77.1 kg)  06/30/24 176 lb (79.8 kg)  06/09/24 177 lb 3.2 oz (80.4 kg)   BP Readings from Last 3 Encounters:  09/25/24 131/84  09/11/24 111/74  06/30/24 (!) 100/58     Past Medical History:  Diagnosis Date   Acute pain of left shoulder 06/05/2019   ADHD (attention deficit hyperactivity disorder)    Anemia    iron deficiency   as a teenager   Anxiety    Arthritis    Chronic combined systolic and diastolic congestive heart failure (HCC) 11/23/2022   This is a new diagnosis with Cardiology, she is scheduled on 2/14 for her MRI of her heart.     Depression    Diabetes (HCC)    Diverticulitis 01/16/2024   Edema    GERD (gastroesophageal reflux disease)    History of congestive heart failure    dx by cardiology   Hypertension    patient denies- patient stated that the PCP said it was for heart prevention.   Left leg pain 04/07/2019   LLQ pain 12/08/2015   Migraine headache    Urinary urgency 09/16/2015    Past Surgical History:  Procedure Laterality Date   ABDOMINAL HYSTERECTOMY  2012  CHOLECYSTECTOMY     COLONOSCOPY  12/28/2020   polyps   ESOPHAGEAL DILATION     gamma knife     04/2022   IR ANGIO INTRA EXTRACRAN SEL COM CAROTID INNOMINATE BILAT MOD SED  05/31/2021   IR ANGIO INTRA EXTRACRAN SEL COM CAROTID INNOMINATE BILAT MOD SED  06/27/2021   IR ANGIO INTRA EXTRACRAN SEL COM CAROTID INNOMINATE UNI R MOD SED  11/21/2021   IR ANGIO VERTEBRAL SEL VERTEBRAL UNI R MOD SED  05/31/2021   IR CT HEAD LTD  06/27/2021   IR CT HEAD LTD  11/21/2021   IR INTRA CRAN STENT  06/27/2021   IR INTRA CRAN STENT  11/21/2021   IR RADIOLOGIST EVAL & MGMT  06/03/2021   IR RADIOLOGIST EVAL & MGMT  07/13/2021   IR RADIOLOGIST EVAL & MGMT   08/27/2021   IR RADIOLOGIST EVAL & MGMT  10/30/2021   IR RADIOLOGIST EVAL & MGMT  12/06/2021   IR US  GUIDE VASC ACCESS RIGHT  05/31/2021   IR US  GUIDE VASC ACCESS RIGHT  06/27/2021   IR US  GUIDE VASC ACCESS RIGHT  11/21/2021   OVARIAN CYST REMOVAL     RADIOLOGY WITH ANESTHESIA N/A 06/27/2021   Procedure: STENTING;  Surgeon: Dolphus Carrion, MD;  Location: [facility] OR;  Service: Radiology;  Laterality: N/A;   RADIOLOGY WITH ANESTHESIA N/A 11/21/2021   Procedure: IR WITH ANESTHESIA STENTING;  Surgeon: Dolphus Carrion, MD;  Location: [facility] OR;  Service: Radiology;  Laterality: N/A;    Current Medications: No outpatient medications have been marked as taking for the 10/02/24 encounter (Appointment) with Virginia Virginia CROME, MD.     Allergies:   Patient has no known allergies.   Social History   Socioeconomic History   Marital status: Legally Separated    Spouse name: Lynwood   Number of children: 2   Years of education: 16   Highest education level: Associate degree: academic program  Occupational History   Occupation: Geophysical Data Processor: [employer]  Tobacco Use   Smoking status: Former    Current packs/day: 0.50    Average packs/day: 0.5 packs/day for 26.5 years (13.3 ttl pk-yrs)    Types: Cigarettes    Start date: 03/24/2019    Passive exposure: Current   Smokeless tobacco: Never   Tobacco comments:    06/24/21- quit over a year ago  Vaping Use   Vaping status: Never Used  Substance and Sexual Activity   Alcohol  use: No   Drug use: No   Sexual activity: Yes    Birth control/protection: Surgical  Other Topics Concern   Not on file  Social History Narrative   In relationship, midwife in set designer facility, does a lot of walking and standing on the job, walks for exercise   Caffeine  use: Drinks tea (3 glasses per week)      Patient is right handed. She lives with her 2 children in a one story house. She drinks one large cup of coffee a  day and an occasional tea or soda. She walks daily.      One story home      Social Drivers of Health   Tobacco Use: Medium Risk (10/01/2024)   Patient History    Smoking Tobacco Use: Former    Smokeless Tobacco Use: Never    Passive Exposure: Current  Physicist, Medical Strain: Low Risk (05/07/2024)   Overall Financial Resource Strain (CARDIA)    Difficulty of Paying Living Expenses: Not very hard  Food Insecurity: No Food Insecurity (05/07/2024)   Epic    Worried About Programme Researcher, Broadcasting/film/video in the Last Year: Never true    Ran Out of Food in the Last Year: Never true  Transportation Needs: No Transportation Needs (05/07/2024)   Epic    Lack of Transportation (Medical): No    Lack of Transportation (Non-Medical): No  Physical Activity: Inactive (05/07/2024)   Exercise Vital Sign    Days of Exercise per Week: 0 days    Minutes of Exercise per Session: Not on file  Stress: Stress Concern Present (05/07/2024)   [employer] of Occupational Health - Occupational Stress Questionnaire    Feeling of Stress: To some extent  Social Connections: Unknown (05/07/2024)   Social Connection and Isolation Panel    Frequency of Communication with Friends and Family: More than three times a week    Frequency of Social Gatherings with Friends and Family: Once a week    Attends Religious Services: More than 4 times per year    Active Member of Clubs or Organizations: Yes    Attends Banker Meetings: More than 4 times per year    Marital Status: Patient declined  Depression (PHQ2-9): Low Risk (09/30/2024)   Depression (PHQ2-9)    PHQ-2 Score: 0  Alcohol  Screen: Low Risk (05/07/2024)   Alcohol  Screen    Last Alcohol  Screening Score (AUDIT): 1  Housing: Low Risk (05/07/2024)   Epic    Unable to Pay for Housing in the Last Year: No    Number of Times Moved in the Last Year: 1    Homeless in the Last Year: No  Utilities: Not At Risk (10/13/2022)   [facility]HC Utilities    Threatened with loss of  utilities: No  Health Literacy: Not on file     Family History: The patient's family history includes ADD / ADHD in her son; Anxiety disorder in her father; Arthritis in her maternal grandmother; Asthma in her son; Birth defects in her maternal aunt; Breast cancer in her maternal aunt and paternal grandmother; Cancer in her maternal aunt and maternal grandmother; Diabetes in her father, maternal grandmother, mother, and paternal grandmother; Healthy in her son and son; Hypertension in her brother, father, and mother; Kidney failure in her father; Pancreatic cancer in her maternal grandmother; Stroke in her paternal grandfather.  ROS:   Please see the history of present illness.     All other systems reviewed and are negative.  EKGs/Labs/Other Studies Reviewed:    The following studies were reviewed today:   EKG:   07/25/2022: Normal sinus rhythm, rate 89, LVH 07/03/23: NSR, rate 76, LVH  Recent Labs: 03/17/2024: B Natriuretic Peptide 8.0; Magnesium 2.0 06/04/2024: TSH 1.510 09/11/2024: ALT 12; BUN 12; Creat 0.62; Hemoglobin 13.2; Platelets 303; Potassium 4.0; Sodium 139  Recent Lipid Panel    Component Value Date/Time   CHOL 196 05/08/2024 0914   TRIG 76 05/08/2024 0914   HDL 55 05/08/2024 0914   CHOLHDL 3.6 05/08/2024 0914   LDLCALC 127 (H) 05/08/2024 0914    Physical Exam:    VS:  There were no vitals taken for this visit.    Wt Readings from Last 3 Encounters:  09/11/24 170 lb (77.1 kg)  06/30/24 176 lb (79.8 kg)  06/09/24 177 lb 3.2 oz (80.4 kg)     GEN:  Well nourished, well developed in no acute distress HEENT: Normal NECK: No JVD; No carotid bruits LYMPHATICS: No lymphadenopathy CARDIAC: RRR, no murmurs, rubs, gallops  RESPIRATORY:  Clear to auscultation without rales, wheezing or rhonchi  ABDOMEN: Soft, non-tender, non-distended MUSCULOSKELETAL:  No edema; No deformity  SKIN: Warm and dry NEUROLOGIC:  Alert and oriented x 3 PSYCHIATRIC:  Normal affect    ASSESSMENT:    No diagnosis found.    PLAN:    Chronic combined heart failure: Echocardiogram 09/01/2022 showed EF 30 to 35%, normal RV function, no significant valvular disease.  Coronary CTA on 08/25/2022 showed normal coronary arteries.  Cardiac MRI 10/18/2022 showed LVEF 44%, RVEF 52%, no LGE. Appears euvolemic.  Echocardiogram 06/18/2023 showed EF 45%, normal RV function, mild MR/AI.  Echocardiogram 07/28/2024 showed EF 40 to 45%, G3 DD, normal RV function. -Appears euvolemic on exam -Continue Coreg  3.125 mg twice daily -Continue spironolactone  12.5 mg daily.  -She reports she stopped both Entresto  and Jardiance  due to itching and itching resolved.  Unclear which medication was culprit.  Restarted Jardiance  10 mg daily and asked patient to let us  know if itching recurs***  Palpitations: Zio patch x 13 days 09/2022 showed no significant arrhythmias.  Hypertension: On Coreg  3.125 mg twice daily, spironolactone  12.5 mg daily. Appears controlled.   Hyperlipidemia: On atorvastatin  10 mg daily.    T2DM: Admission 10/2022 with DKA.  Started on insulin .  A1c 11.4% 10/13/2022.  Referred to endocrinology.  A1c has improved significantly, most recently 5.5% on 05/08/2024  Left sigmoid sinus stenosis: Status post stenting 06/2021.  Continue aspirin  81 mg daily  Left superior cerebellar AVM: Underwent gamma knife radiosurgery at [facility]  Idiopathic intracranial hypertension: Was on Diamox  250 mg twice daily, but now off.  Follows with neurology  Snoring/daytime somnolence: No OSA on sleep study 03/2023  Leg pain: Normal ABIs 11/2022  Obesity: has lost 50 lbs since 2023.  Congratulated patient on weight loss  RTC in 3 months***   Medication Adjustments/Labs and Tests Ordered: Current medicines are reviewed at length with the patient today.  Concerns regarding medicines are outlined above.  No orders of the defined types were placed in this encounter.  No orders of the defined types were placed  in this encounter.   There are no Patient Instructions on file for this visit.   Signed, Virginia LITTIE Nanas, MD  10/01/2024 10:59 PM    Virginia Douglas "

## 2024-10-02 ENCOUNTER — Ambulatory Visit: Attending: Cardiology | Admitting: Cardiology

## 2024-10-02 ENCOUNTER — Ambulatory Visit: Admitting: Neurology

## 2024-10-07 ENCOUNTER — Ambulatory Visit
Admission: RE | Admit: 2024-10-07 | Discharge: 2024-10-07 | Disposition: A | Source: Ambulatory Visit | Attending: Nurse Practitioner | Admitting: Nurse Practitioner

## 2024-10-07 DIAGNOSIS — R296 Repeated falls: Secondary | ICD-10-CM

## 2024-10-07 DIAGNOSIS — M25511 Pain in right shoulder: Secondary | ICD-10-CM

## 2024-10-09 ENCOUNTER — Encounter: Payer: Self-pay | Admitting: Orthopedic Surgery

## 2024-10-10 ENCOUNTER — Other Ambulatory Visit: Payer: Self-pay | Admitting: Orthopedic Surgery

## 2024-10-10 NOTE — Telephone Encounter (Signed)
 Already on cymbalta  - no go for tramadol  = hold off on opoids

## 2024-10-20 ENCOUNTER — Other Ambulatory Visit

## 2024-10-21 ENCOUNTER — Encounter: Payer: Self-pay | Admitting: Nurse Practitioner

## 2024-10-29 ENCOUNTER — Ambulatory Visit: Admitting: Orthopedic Surgery

## 2024-12-10 ENCOUNTER — Ambulatory Visit: Admitting: Physician Assistant

## 2025-06-05 ENCOUNTER — Ambulatory Visit: Admitting: Neurology
